# Patient Record
Sex: Female | Born: 1995 | Race: White | Hispanic: No | Marital: Single | Smoking: Current every day smoker
Health system: Southern US, Community
[De-identification: ages and names within clinical notes are randomized; demographics above are authoritative.]

## PROBLEM LIST (undated history)

## (undated) VITALS — BP 114/69 | HR 98 | Temp 98.7°F | Resp 16 | Ht 63.0 in | Wt 216.0 lb

## (undated) DIAGNOSIS — F419 Anxiety disorder, unspecified: Secondary | ICD-10-CM

## (undated) DIAGNOSIS — R519 Headache, unspecified: Secondary | ICD-10-CM

## (undated) DIAGNOSIS — F329 Major depressive disorder, single episode, unspecified: Secondary | ICD-10-CM

## (undated) DIAGNOSIS — Z639 Problem related to primary support group, unspecified: Secondary | ICD-10-CM

## (undated) DIAGNOSIS — J45909 Unspecified asthma, uncomplicated: Secondary | ICD-10-CM

## (undated) DIAGNOSIS — F3160 Bipolar disorder, current episode mixed, unspecified: Secondary | ICD-10-CM

## (undated) DIAGNOSIS — F32A Depression, unspecified: Secondary | ICD-10-CM

## (undated) DIAGNOSIS — I10 Essential (primary) hypertension: Secondary | ICD-10-CM

## (undated) DIAGNOSIS — R569 Unspecified convulsions: Secondary | ICD-10-CM

## (undated) DIAGNOSIS — F411 Generalized anxiety disorder: Secondary | ICD-10-CM

## (undated) HISTORY — DX: Unspecified asthma, uncomplicated: J45.909

## (undated) HISTORY — DX: Problem related to primary support group, unspecified: Z63.9

## (undated) HISTORY — DX: Generalized anxiety disorder: F41.1

## (undated) HISTORY — DX: Bipolar disorder, current episode mixed, unspecified: F31.60

## (undated) HISTORY — DX: Depression, unspecified: F32.A

---
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
## 2017-04-25 DIAGNOSIS — K5909 Other constipation: Secondary | ICD-10-CM | POA: Insufficient documentation

## 2018-02-01 DIAGNOSIS — Z87898 Personal history of other specified conditions: Secondary | ICD-10-CM | POA: Insufficient documentation

## 2018-02-01 DIAGNOSIS — B3731 Acute candidiasis of vulva and vagina: Secondary | ICD-10-CM | POA: Insufficient documentation

## 2019-05-24 LAB — AMB REFERRAL TO OB-GYN: Pap: NEGATIVE

## 2019-05-29 LAB — CYTOLOGY - PAP

## 2019-07-02 DIAGNOSIS — B951 Streptococcus, group B, as the cause of diseases classified elsewhere: Secondary | ICD-10-CM | POA: Insufficient documentation

## 2019-07-02 LAB — OB RESULTS CONSOLE RPR: RPR: NONREACTIVE

## 2019-07-02 LAB — OB RESULTS CONSOLE HGB/HCT, BLOOD
HCT: 40 (ref 29–41)
Hemoglobin: 12.9

## 2019-07-02 LAB — OB RESULTS CONSOLE VARICELLA ZOSTER ANTIBODY, IGG: Varicella: IMMUNE

## 2019-07-02 LAB — OB RESULTS CONSOLE RUBELLA ANTIBODY, IGM: Rubella: NON-IMMUNE/NOT IMMUNE

## 2019-07-02 LAB — OB RESULTS CONSOLE ABO/RH: RH Type: POSITIVE

## 2019-07-02 LAB — OB RESULTS CONSOLE HEPATITIS B SURFACE ANTIGEN: Hepatitis B Surface Ag: NEGATIVE

## 2019-07-02 LAB — OB RESULTS CONSOLE PLATELET COUNT: Platelets: 249

## 2019-07-02 LAB — OB RESULTS CONSOLE ANTIBODY SCREEN: Antibody Screen: NEGATIVE

## 2019-07-02 LAB — OB RESULTS CONSOLE GC/CHLAMYDIA
Chlamydia: NEGATIVE
Gonorrhea: NEGATIVE

## 2019-07-03 LAB — HEP C AB W/REFL: Hep C Virus Ab: REACTIVE

## 2019-07-06 ENCOUNTER — Encounter (HOSPITAL_COMMUNITY): Payer: Self-pay

## 2019-07-06 ENCOUNTER — Ambulatory Visit (HOSPITAL_COMMUNITY)
Admission: EM | Admit: 2019-07-06 | Discharge: 2019-07-06 | Disposition: A | Discharge status: Home / Self Care | Payer: Medicaid Other | Attending: Family Medicine | Admitting: Family Medicine

## 2019-07-06 ENCOUNTER — Inpatient Hospital Stay (HOSPITAL_COMMUNITY)
Admission: AD | Admit: 2019-07-06 | Discharge: 2019-07-06 | Disposition: A | Discharge status: Home / Self Care | Payer: Medicaid Other | Source: Ambulatory Visit | Attending: Obstetrics and Gynecology | Admitting: Obstetrics and Gynecology

## 2019-07-06 ENCOUNTER — Other Ambulatory Visit: Payer: Self-pay

## 2019-07-06 DIAGNOSIS — Z3A11 11 weeks gestation of pregnancy: Secondary | ICD-10-CM | POA: Diagnosis not present

## 2019-07-06 DIAGNOSIS — R609 Edema, unspecified: Secondary | ICD-10-CM | POA: Diagnosis not present

## 2019-07-06 DIAGNOSIS — L292 Pruritus vulvae: Secondary | ICD-10-CM | POA: Insufficient documentation

## 2019-07-06 DIAGNOSIS — Z3201 Encounter for pregnancy test, result positive: Secondary | ICD-10-CM

## 2019-07-06 DIAGNOSIS — N898 Other specified noninflammatory disorders of vagina: Secondary | ICD-10-CM | POA: Diagnosis not present

## 2019-07-06 DIAGNOSIS — N9089 Other specified noninflammatory disorders of vulva and perineum: Secondary | ICD-10-CM | POA: Insufficient documentation

## 2019-07-06 DIAGNOSIS — B373 Candidiasis of vulva and vagina: Secondary | ICD-10-CM

## 2019-07-06 DIAGNOSIS — O26891 Other specified pregnancy related conditions, first trimester: Secondary | ICD-10-CM | POA: Insufficient documentation

## 2019-07-06 DIAGNOSIS — B3731 Acute candidiasis of vulva and vagina: Secondary | ICD-10-CM

## 2019-07-06 LAB — POCT URINALYSIS DIP (DEVICE)
Bilirubin Urine: NEGATIVE
Glucose, UA: NEGATIVE mg/dL
Hgb urine dipstick: NEGATIVE
Ketones, ur: NEGATIVE mg/dL
Nitrite: NEGATIVE
Protein, ur: NEGATIVE mg/dL
Specific Gravity, Urine: 1.025 (ref 1.005–1.030)
Urobilinogen, UA: 0.2 mg/dL (ref 0.0–1.0)
pH: 7.5 (ref 5.0–8.0)

## 2019-07-06 LAB — HEPATITIS C RNA, QUANTITATIVE, PCR: HCV RNA (IU/mL): NOT DETECTED

## 2019-07-06 LAB — HIV ANTIBODY (ROUTINE TESTING W REFLEX): HIV Screen 4th Generation wRfx: NONREACTIVE

## 2019-07-06 LAB — POCT PREGNANCY, URINE: Preg Test, Ur: POSITIVE — AB

## 2019-07-06 LAB — POC URINE PREG, ED: Preg Test, Ur: POSITIVE — AB

## 2019-07-06 MED ORDER — MONISTAT 1 COMBO PACK 1200 & 2 MG & % VA KIT: 1.0000 | PACK | Freq: Once | VAGINAL | 0 refills | Status: AC

## 2019-07-06 NOTE — ED Triage Notes (Signed)
Pt presents with vaginal itching and irritation with some yellow discharge since yesterday.

## 2019-07-06 NOTE — MAU Note (Addendum)
Pt c/o of vaginal, itching, swelling since Monday. Got worse yesterday.  Says she's had these problems since she's 23 years old. Patient says she's not been able to get it checked out before. LMP 04/19/19. Does not have an OBGYN. Was seen at the ED two hours ago for same problem.

## 2019-07-06 NOTE — ED Provider Notes (Signed)
[place]    CSN: [id number] Arrival date & time: 07/06/19  9935      History   Chief Complaint Chief Complaint  Patient presents with  . Vaginitis    HPI Sharon Ward is a 23 y.o. female.   54 y old with Hx of recurrent yeast infection presented to the urgent care with a complaint of vaginal itching with irritation and yellow white discharge X 5 days. Patient stated she moved form [place] and is currently [redacted] weeks pregnant . She doesn't have an Ob GYN in [place] yet. She is sexually active and have one female partner.  The history is provided by the patient. No language interpreter was used.    History reviewed. No pertinent past medical history.  There are no active problems to display for this patient.   Past Surgical History:  Procedure Laterality Date  . PILONIDAL CYST / SINUS EXCISION      OB History    Gravida  1   Para      Term      Preterm      AB      Living        SAB      TAB      Ectopic      Multiple      Live Births               Home Medications    Prior to Admission medications   Medication Sig Start Date End Date Taking? Authorizing Provider  miconazole (MONISTAT 1 COMBO PACK) kit Place 1 each vaginally once for 1 dose. 07/06/19 07/06/19  Emerson Monte, FNP    Family History Family History  Problem Relation Age of Onset  . Healthy Mother   . Healthy Father     Social History Social History   Tobacco Use  . Smoking status: Never Smoker  . Smokeless tobacco: Never Used  Substance Use Topics  . Alcohol use: Not on file  . Drug use: Not on file     Allergies   Citrus, Orange (diagnostic), Peach flavor, Pear, and Pineapple   Review of Systems Review of Systems  Constitutional: Negative for activity change, appetite change, chills, fatigue and fever.  Respiratory: Negative for cough, chest tightness and shortness of breath.   Cardiovascular: Negative for chest pain and leg swelling.   Genitourinary: Positive for vaginal discharge. Negative for difficulty urinating and dysuria.       Itching and irritation     Physical Exam Triage Vital Signs ED Triage Vitals  Enc Vitals Group     BP 07/06/19 1014 117/72     Pulse Rate 07/06/19 1014 70     Resp 07/06/19 1014 20     Temp 07/06/19 1014 98 F (36.7 C)     Temp Source 07/06/19 1014 Oral     SpO2 07/06/19 1014 99 %     Weight --      Height --      Head Circumference --      Peak Flow --      Pain Score 07/06/19 1016 5     Pain Loc --      Pain Edu? --      Excl. in [place]? --    No data found.  Updated Vital Signs BP 117/72 (BP Location: Right Arm)   Pulse 70   Temp 98 F (36.7 C) (Oral)   Resp 20   LMP 04/19/2019   SpO2  99%   Visual Acuity Right Eye Distance:   Left Eye Distance:   Bilateral Distance:    Right Eye Near:   Left Eye Near:    Bilateral Near:     Physical Exam Constitutional:      General: She is not in acute distress.    Appearance: Normal appearance. She is normal weight. She is not ill-appearing.  Cardiovascular:     Rate and Rhythm: Normal rate and regular rhythm.     Pulses: Normal pulses.     Heart sounds: Normal heart sounds.  Pulmonary:     Effort: Pulmonary effort is normal. No respiratory distress.     Breath sounds: Normal breath sounds. No wheezing.  Chest:     Chest wall: No tenderness.  Genitourinary:    General: Normal vulva.     Vagina: Vaginal discharge present.     Comments: Vaginal itching Neurological:     Mental Status: She is alert.      UC Treatments / Results  Labs (all labs ordered are listed, but only abnormal results are displayed) Labs Reviewed  POC URINE PREG, ED - Abnormal; Notable for the following components:      Result Value   Preg Test, Ur POSITIVE (*)    All other components within normal limits  POCT URINALYSIS DIP (DEVICE) - Abnormal; Notable for the following components:   Leukocytes,Ua SMALL (*)    All other components  within normal limits  POCT PREGNANCY, URINE - Abnormal; Notable for the following components:   Preg Test, Ur POSITIVE (*)    All other components within normal limits  HIV ANTIBODY (ROUTINE TESTING W REFLEX)  CERVICOVAGINAL ANCILLARY ONLY    EKG   Radiology No results found.  Procedures Procedures (including critical care time)  Medications Ordered in UC Medications - No data to display  Initial Impression / Assessment and Plan / UC Course  I have reviewed the triage vital signs and the nursing notes.  Pertinent labs & imaging results that were available during my care of the patient were reviewed by me and considered in my medical decision making (see chart for details).    Patient was advised to see OB-GYN. Monistat was prescribed. We will call her when her STD result becomes available Final Clinical Impressions(s) / UC Diagnoses   Final diagnoses:  Yeast vaginitis     Discharge Instructions     Our urine test confirm you are pregnant You are being treated for possible yeast vaginitis (BV) today.  Please take and complete all medications as prescribed.  We will notify you when we receive the results of your sexually transmitted infection (STI) screening tests. If needed, we will prescribe any medications needed.  If you are not improving over the next few days or feel you are worsening please follow up with OBGYN ASAP or  go to the Emergency Department if you are unable to see your regular doctor.    ED Prescriptions    Medication Sig Dispense Auth. Provider   miconazole (MONISTAT 1 COMBO PACK) kit Place 1 each vaginally once for 1 dose. 1 each Brendia Dampier, Darrelyn Hillock, FNP     PDMP not reviewed this encounter.   Emerson Monte, FNP 07/06/19 1110

## 2019-07-06 NOTE — MAU Provider Note (Signed)
First Provider Initiated Contact with Patient 07/06/19 1254      S [name] is a 23 y.o. G1P0 at [redacted]w[redacted]d who was seen at urgent care about 2 hours ago. She states that they told her she needed to be seen here now. She has vulvar irritation that she reports she has had since the age of 68. SHe states that from [age] it was "pretty much all the time". Then she had a few years between [age] when it wasn't as bad. However last year starting at age 14 it was the worst it has ever been. She denies any OB complaints today and has just been seen for STD testing at urgent care today. She has used the monistat one day treatment just prior to arrival, and states that it has not improved her symptoms yet.   O BP 100/62 (BP Location: Right Arm)   Pulse 83   Temp 99.1 F (37.3 C) (Oral)   Resp 18   Ht 5\' 3"  (1.6 m)   Wt 68.4 kg   LMP 04/19/2019   SpO2 100% Comment: room air  BMI 26.71 kg/m  Physical Exam  Nursing note and vitals reviewed. Constitutional: She is oriented to person, place, and time. She appears well-developed and well-nourished. No distress.  HENT:  Head: Normocephalic.  Cardiovascular: Normal rate.  Respiratory: Effort normal.  GI: Soft. There is no abdominal tenderness. There is no rebound.  Neurological: She is alert and oriented to person, place, and time.  Skin: Skin is warm and dry.  Psychiatric: She has a normal mood and affect.    A Medical screening exam complete 1. Irritation of vulva   2. [redacted] weeks gestation of pregnancy      P DC home Patient advised to make an appt in the office, phone number and address given Comfort measures reviewed  1st Trimester precautions  RX: no new RX  Return to MAU as needed FU with OB as planned  Sharon Ward for Sharon Ward. Schedule an appointment as soon as possible for a visit.   Specialty: Obstetrics and Gynecology Contact information: Sharon Ward [address], [address] [id number] [place] [id number] Hendricks DNP, CNM  07/06/19  1:10 PM

## 2019-07-06 NOTE — Discharge Instructions (Signed)
Our urine test confirm you are pregnant You are being treated for possible yeast vaginitis (BV) today.  Please take and complete all medications as prescribed.  We will notify you when we receive the results of your sexually transmitted infection (STI) screening tests. If needed, we will prescribe any medications needed.  If you are not improving over the next few days or feel you are worsening please follow up with OBGYN ASAP or  go to the Emergency Department if you are unable to see your regular doctor.

## 2019-07-10 LAB — CERVICOVAGINAL ANCILLARY ONLY
Bacterial vaginitis: NEGATIVE
Candida vaginitis: POSITIVE — AB
Chlamydia: NEGATIVE
Neisseria Gonorrhea: NEGATIVE
Trichomonas: NEGATIVE

## 2019-08-10 NOTE — L&D Delivery Note (Signed)
OB/GYN Faculty Practice Delivery Note  Sharon Ward is a 24 y.o. G1P0 s/p VD at [redacted]w[redacted]d. She was admitted for SOL/SROM.   ROM: 7h 42m with clear fluid GBS Status: Positive   Maximum Maternal Temperature: 98.69F  Labor Progress: . Initial SVE: 4/70/-3. She then progressed to complete.   Delivery Date/Time: 6/7 @ [phone] Delivery: Called to room and patient was complete and pushing. Head delivered in ROA position. No nuchal cord present. Shoulder and body delivered in usual fashion. Infant with spontaneous cry, placed on mother's abdomen, dried and stimulated. Cord clamped x 2 after 1-minute delay, and cut by FOB. Cord blood drawn. Placenta delivered spontaneously with gentle cord traction. Fundus firm with massage and Pitocin. Labia, perineum, vagina, and cervix inspected inspected with no lacerations.  Baby Weight: pending  Placenta: Sent to L&D Complications: None Lacerations: None EBL: 101 mL Analgesia: None   Infant:  APGAR (1 MIN): 8   APGAR (5 MINS): 9   APGAR (10 MINS):     Jerilynn Birkenhead, MD [facility] Family Medicine Fellow, [facility], [facility] 01/14/2020, 4:47 AM

## 2019-09-18 DIAGNOSIS — F319 Bipolar disorder, unspecified: Secondary | ICD-10-CM | POA: Insufficient documentation

## 2019-09-18 DIAGNOSIS — Z9229 Personal history of other drug therapy: Secondary | ICD-10-CM

## 2019-09-18 DIAGNOSIS — J302 Other seasonal allergic rhinitis: Secondary | ICD-10-CM | POA: Insufficient documentation

## 2019-09-18 DIAGNOSIS — R87612 Low grade squamous intraepithelial lesion on cytologic smear of cervix (LGSIL): Secondary | ICD-10-CM | POA: Insufficient documentation

## 2019-09-18 DIAGNOSIS — F419 Anxiety disorder, unspecified: Secondary | ICD-10-CM | POA: Insufficient documentation

## 2019-09-18 DIAGNOSIS — F32A Depression, unspecified: Secondary | ICD-10-CM | POA: Insufficient documentation

## 2019-09-18 DIAGNOSIS — F411 Generalized anxiety disorder: Secondary | ICD-10-CM

## 2019-09-18 DIAGNOSIS — F332 Major depressive disorder, recurrent severe without psychotic features: Secondary | ICD-10-CM

## 2019-09-18 DIAGNOSIS — F329 Major depressive disorder, single episode, unspecified: Secondary | ICD-10-CM | POA: Insufficient documentation

## 2019-09-18 DIAGNOSIS — J45909 Unspecified asthma, uncomplicated: Secondary | ICD-10-CM | POA: Insufficient documentation

## 2019-09-18 DIAGNOSIS — Z8659 Personal history of other mental and behavioral disorders: Secondary | ICD-10-CM | POA: Insufficient documentation

## 2019-09-18 DIAGNOSIS — R45851 Suicidal ideations: Secondary | ICD-10-CM | POA: Insufficient documentation

## 2019-09-18 DIAGNOSIS — R112 Nausea with vomiting, unspecified: Secondary | ICD-10-CM

## 2019-09-18 DIAGNOSIS — F913 Oppositional defiant disorder: Secondary | ICD-10-CM | POA: Insufficient documentation

## 2019-09-18 HISTORY — DX: Major depressive disorder, recurrent severe without psychotic features: F33.2

## 2019-09-18 HISTORY — DX: Generalized anxiety disorder: F41.1

## 2019-09-24 DIAGNOSIS — B951 Streptococcus, group B, as the cause of diseases classified elsewhere: Secondary | ICD-10-CM

## 2019-09-24 DIAGNOSIS — O0990 Supervision of high risk pregnancy, unspecified, unspecified trimester: Secondary | ICD-10-CM

## 2019-09-28 ENCOUNTER — Ambulatory Visit (INDEPENDENT_AMBULATORY_CARE_PROVIDER_SITE_OTHER): Payer: Medicaid Other | Admitting: Family Medicine

## 2019-09-28 ENCOUNTER — Encounter: Payer: Self-pay | Admitting: Family Medicine

## 2019-09-28 ENCOUNTER — Other Ambulatory Visit: Payer: Self-pay

## 2019-09-28 VITALS — BP 112/53 | HR 81 | Wt 183.7 lb

## 2019-09-28 DIAGNOSIS — O99210 Obesity complicating pregnancy, unspecified trimester: Secondary | ICD-10-CM | POA: Insufficient documentation

## 2019-09-28 DIAGNOSIS — Z3A23 23 weeks gestation of pregnancy: Secondary | ICD-10-CM

## 2019-09-28 DIAGNOSIS — O0992 Supervision of high risk pregnancy, unspecified, second trimester: Secondary | ICD-10-CM

## 2019-09-28 DIAGNOSIS — O0990 Supervision of high risk pregnancy, unspecified, unspecified trimester: Secondary | ICD-10-CM

## 2019-09-28 DIAGNOSIS — Z3403 Encounter for supervision of normal first pregnancy, third trimester: Secondary | ICD-10-CM | POA: Insufficient documentation

## 2019-09-28 DIAGNOSIS — O4692 Antepartum hemorrhage, unspecified, second trimester: Secondary | ICD-10-CM

## 2019-09-28 DIAGNOSIS — O0932 Supervision of pregnancy with insufficient antenatal care, second trimester: Secondary | ICD-10-CM | POA: Insufficient documentation

## 2019-09-28 DIAGNOSIS — F32A Depression, unspecified: Secondary | ICD-10-CM

## 2019-09-28 DIAGNOSIS — O99212 Obesity complicating pregnancy, second trimester: Secondary | ICD-10-CM

## 2019-09-28 DIAGNOSIS — F329 Major depressive disorder, single episode, unspecified: Secondary | ICD-10-CM

## 2019-09-28 DIAGNOSIS — O4690 Antepartum hemorrhage, unspecified, unspecified trimester: Secondary | ICD-10-CM | POA: Insufficient documentation

## 2019-09-28 MED ORDER — BLOOD PRESSURE KIT DEVI: 1.0000 | 0 refills | Status: DC | PRN

## 2019-09-28 NOTE — Progress Notes (Addendum)
PRENATAL VISIT NOTE  Subjective:  Sharon Ward is a 24 y.o. G1P0 at 61w1dbeing seen today for ongoing prenatal care.  She is currently monitored for the following issues for this high-risk pregnancy and has Anxiety; Asthma; [place]; Depression; LGSIL on Pap smear of cervix; Oppositional defiant disorder; H/O psychiatric hospitalization; Seasonal allergies; Supervision of high risk pregnancy, antepartum; and Vaginal bleeding during pregnancy on their problem list.  Patient reports Had vaginal itching 1 week ago, used monostat and this alleviated her symtpoms.  Just moved to [place] in Jan.  [facility] She reports she had [place] Dec 2020- reports she was told she needs another appointment between 18-20 wks. [place] 11/23 showed possible increaesd NT and then report UKoreain dec that this was improved. [facility] [place] [facility].  Contractions: Not present.  .  Movement: Present. Denies leaking of fluid.   The following portions of the patient's history were reviewed and updated as appropriate: allergies, current medications, past family history, past medical history, past social history, past surgical history and problem list.   Objective:   Vitals:   09/28/19 0958  BP: (!) 112/53  Pulse: 81  Weight: 183 lb 11.2 oz (83.3 kg)    Fetal Status: Fetal Heart Rate (bpm): 152 Fundal Height: 24 cm Movement: Present     General:  Alert, oriented and cooperative. Patient is in no acute distress.  Skin: Skin is warm and dry. No rash noted.   Cardiovascular: Normal heart rate noted  Respiratory: Normal respiratory effort, no problems with respiration noted  Abdomen: Soft, gravid, appropriate for gestational age.  Pain/Pressure: Absent     Pelvic: Cervical exam deferred        Extremities: Normal range of motion.  Edema: Trace  Mental Status: Normal mood and affect. Normal behavior. Normal judgment and thought content.   Assessment and  Plan:  Pregnancy: G1P0 at 216w1d1. Supervision of high risk pregnancy, antepartum [facility]/FMOB/OBGYN/residents etc - Patient will sign ROI so we can [facility] - Reviewed next visit will have 28 wk labs  - reports history of recurrent yeast infections, no sx currently - [facility] signed. Patient may need follow up.  - Blood Pressure Monitoring (BLOOD PRESSURE KIT) DEVI; 1 Device by Does not apply route as needed.  Dispense: 1 each; Refill: 0 - [facility] AMB BABYSCRIPTS SCHEDULE OPTIMIZATION - [place]FM OB COMP + 14 WK; Future - Amb ref to [facility]  2. Vaginal bleeding during pregnancy Reports only ever sex last month Counseled to see care if happens again and about normality of this when pregnant  3. Depression, unspecified depression type PHQ9= 11 and GAD= 6 today. Confirmed taking Zoloft and Seroquel as only pysch medications Patient with significant pysch history and is aware of postpartum risk of worsening Strongly desires counseling and [facility]. Obesity - has gained excessive weight (30 lbs) - Reviewed importance of diet and exercise - Discussed 2hr gtt at next visit  Preterm labor symptoms and general obstetric precautions including but not limited to vaginal bleeding, contractions, leaking of fluid and fetal movement were reviewed in detail with the patient. Please refer to After Visit Summary for other counseling recommendations.   Return in about 4 weeks (around 10/26/2019)  for Routine prenatal care, in person, 28 wk labs.  Future Appointments  Date Time Provider [place]  10/02/2019  7:45 AM WH-MFC [place] 2 WH-MFCUS MFC-US  10/08/2019 10:15 AM George [place]  10/25/2019  8:20 AM WOC-WOCA LAB WOC-WOCA WOC  10/25/2019  8:55 AM Julianne Handler, CNM WOC-WOCA WOC     Caren Macadam, MD

## 2019-09-28 NOTE — Patient Instructions (Signed)
Healthy Eating Following a healthy eating pattern may help you to achieve and maintain a healthy body weight, reduce the risk of chronic disease, and live a long and productive life. It is important to follow a healthy eating pattern at an appropriate calorie level for your body. Your nutritional needs should be met primarily through food by choosing a variety of nutrient-rich foods. What are tips for following this plan? Reading food labels  Read labels and choose the following: ? Reduced or low sodium. ? Juices with 100% fruit juice. ? Foods with low saturated fats and high polyunsaturated and monounsaturated fats. ? Foods with whole grains, such as whole wheat, cracked wheat, brown rice, and wild rice. ? Whole grains that are fortified with folic acid. This is recommended for women who are pregnant or who want to become pregnant.  Read labels and avoid the following: ? Foods with a lot of added sugars. These include foods that contain brown sugar, corn sweetener, corn syrup, dextrose, fructose, glucose, high-fructose corn syrup, honey, invert sugar, lactose, malt syrup, maltose, molasses, raw sugar, sucrose, trehalose, or turbinado sugar.  Do not eat more than the following amounts of added sugar per day:  6 teaspoons (25 g) for women.  9 teaspoons (38 g) for men. ? Foods that contain processed or refined starches and grains. ? Refined grain products, such as white flour, degermed cornmeal, white bread, and white rice. Shopping  Choose nutrient-rich snacks, such as vegetables, whole fruits, and nuts. Avoid high-calorie and high-sugar snacks, such as potato chips, fruit snacks, and candy.  Use oil-based dressings and spreads on foods instead of solid fats such as butter, stick margarine, or cream cheese.  Limit pre-made sauces, mixes, and "instant" products such as flavored rice, instant noodles, and ready-made pasta.  Try more plant-protein sources, such as tofu, tempeh, black beans,  edamame, lentils, nuts, and seeds.  Explore eating plans such as the Mediterranean diet or vegetarian diet. Cooking  Use oil to saut or stir-fry foods instead of solid fats such as butter, stick margarine, or lard.  Try baking, boiling, grilling, or broiling instead of frying.  Remove the fatty part of meats before cooking.  Steam vegetables in water or broth. Meal planning   At meals, imagine dividing your plate into fourths: ? One-half of your plate is fruits and vegetables. ? One-fourth of your plate is whole grains. ? One-fourth of your plate is protein, especially lean meats, poultry, eggs, tofu, beans, or nuts.  Include low-fat dairy as part of your daily diet. Lifestyle  Choose healthy options in all settings, including home, work, school, restaurants, or stores.  Prepare your food safely: ? Wash your hands after handling raw meats. ? Keep food preparation surfaces clean by regularly washing with hot, soapy water. ? Keep raw meats separate from ready-to-eat foods, such as fruits and vegetables. ? Cook seafood, meat, poultry, and eggs to the recommended internal temperature. ? Store foods at safe temperatures. In general:  Keep cold foods at 59F (4.4C) or below.  Keep hot foods at 159F (60C) or above.  Keep your freezer at [facility] (-17.8C) or below.  Foods are no longer safe to eat when they have been between the temperatures of 40-159F (4.4-60C) for more than 2 hours. What foods should I eat? Fruits Aim to eat 2 cup-equivalents of fresh, canned (in natural juice), or frozen fruits each day. Examples of 1 cup-equivalent of fruit include 1 small apple, 8 large strawberries, 1 cup canned fruit,  cup  dried fruit, or 1 cup 100% juice. Vegetables Aim to eat 2-3 cup-equivalents of fresh and frozen vegetables each day, including different varieties and colors. Examples of 1 cup-equivalent of vegetables include 2 medium carrots, 2 cups raw, leafy greens, 1 cup chopped  vegetable (raw or cooked), or 1 medium baked potato. Grains Aim to eat 6 ounce-equivalents of whole grains each day. Examples of 1 ounce-equivalent of grains include 1 slice of bread, 1 cup ready-to-eat cereal, 3 cups popcorn, or  cup cooked rice, pasta, or cereal. Meats and other proteins Aim to eat 5-6 ounce-equivalents of protein each day. Examples of 1 ounce-equivalent of protein include 1 egg, 1/2 cup nuts or seeds, or 1 tablespoon (16 g) peanut butter. A cut of meat or fish that is the size of a deck of cards is about 3-4 ounce-equivalents.  Of the protein you eat each week, try to have at least 8 ounces come from seafood. This includes salmon, trout, herring, and anchovies. Dairy Aim to eat 3 cup-equivalents of fat-free or low-fat dairy each day. Examples of 1 cup-equivalent of dairy include 1 cup (240 mL) milk, 8 ounces (250 g) yogurt, 1 ounces (44 g) natural cheese, or 1 cup (240 mL) fortified soy milk. Fats and oils  Aim for about 5 teaspoons (21 g) per day. Choose monounsaturated fats, such as canola and olive oils, avocados, peanut butter, and most nuts, or polyunsaturated fats, such as sunflower, corn, and soybean oils, walnuts, pine nuts, sesame seeds, sunflower seeds, and flaxseed. Beverages  Aim for six 8-oz glasses of water per day. Limit coffee to three to five 8-oz cups per day.  Limit caffeinated beverages that have added calories, such as soda and energy drinks.  Limit alcohol intake to no more than 1 drink a day for nonpregnant women and 2 drinks a day for men. One drink equals 12 oz of beer (355 mL), 5 oz of wine (148 mL), or 1 oz of hard liquor (44 mL). Seasoning and other foods  Avoid adding excess amounts of salt to your foods. Try flavoring foods with herbs and spices instead of salt.  Avoid adding sugar to foods.  Try using oil-based dressings, sauces, and spreads instead of solid fats. This information is based on general [place] nutrition guidelines. For more  information, visit [employer].es. Exact amounts may vary based on your nutrition needs. Summary  A healthy eating plan may help you to maintain a healthy weight, reduce the risk of chronic diseases, and stay active throughout your life.  Plan your meals. Make sure you eat the right portions of a variety of nutrient-rich foods.  Try baking, boiling, grilling, or broiling instead of frying.  Choose healthy options in all settings, including home, work, school, restaurants, or stores. This information is not intended to replace advice given to you by your health care provider. Make sure you discuss any questions you have with your health care provider. Document Revised: 11/07/2017 Document Reviewed: 11/07/2017 Elsevier Patient Education  [place].

## 2019-10-02 ENCOUNTER — Other Ambulatory Visit: Payer: Self-pay

## 2019-10-02 ENCOUNTER — Ambulatory Visit (HOSPITAL_COMMUNITY)
Admission: RE | Admit: 2019-10-02 | Discharge: 2019-10-02 | Disposition: A | Discharge status: Home / Self Care | Payer: Medicaid Other | Source: Ambulatory Visit | Attending: Obstetrics and Gynecology | Admitting: Obstetrics and Gynecology

## 2019-10-02 ENCOUNTER — Other Ambulatory Visit: Payer: Self-pay | Admitting: Family Medicine

## 2019-10-02 DIAGNOSIS — O99212 Obesity complicating pregnancy, second trimester: Secondary | ICD-10-CM

## 2019-10-02 DIAGNOSIS — O358XX0 Maternal care for other (suspected) fetal abnormality and damage, not applicable or unspecified: Secondary | ICD-10-CM | POA: Diagnosis not present

## 2019-10-02 DIAGNOSIS — O0990 Supervision of high risk pregnancy, unspecified, unspecified trimester: Secondary | ICD-10-CM | POA: Diagnosis not present

## 2019-10-02 DIAGNOSIS — Z3A23 23 weeks gestation of pregnancy: Secondary | ICD-10-CM | POA: Diagnosis not present

## 2019-10-03 ENCOUNTER — Other Ambulatory Visit (HOSPITAL_COMMUNITY): Payer: Self-pay | Admitting: Obstetrics and Gynecology

## 2019-10-03 DIAGNOSIS — Z363 Encounter for antenatal screening for malformations: Secondary | ICD-10-CM

## 2019-10-08 ENCOUNTER — Other Ambulatory Visit: Payer: Self-pay

## 2019-10-08 ENCOUNTER — Telehealth: Payer: Self-pay | Admitting: Family Medicine

## 2019-10-08 ENCOUNTER — Ambulatory Visit (INDEPENDENT_AMBULATORY_CARE_PROVIDER_SITE_OTHER): Payer: No Typology Code available for payment source | Admitting: Clinical

## 2019-10-08 DIAGNOSIS — F39 Unspecified mood [affective] disorder: Secondary | ICD-10-CM

## 2019-10-08 NOTE — BH Specialist Note (Signed)
Integrated Behavioral Health via Telemedicine Video Visit 1 10/08/2019 Sharon Ward [id number]  Number of Integrated Behavioral Health visits: 1 Session Start time: 10:20  Session End time: 11:21 Total time: 41  Referring Provider: Jaynie Collins, MD Type of Visit: Video Patient/Family location: Home [facility] Provider location: [facility] All persons participating in visit: Patient Sharon Ward and [facility] Sharon Ward    Confirmed patient's address: Yes  Confirmed patient's phone number: Yes  Any changes to demographics: No   Confirmed patient's insurance: Yes  Any changes to patient's insurance: No   Discussed confidentiality: Yes   I connected with Sharon Ward  by a video enabled telemedicine application and verified that I am speaking with the correct person using two identifiers.     I discussed the limitations of evaluation and management by telemedicine and the availability of in person appointments.  I discussed that the purpose of this visit is to provide behavioral health care while limiting exposure to the novel coronavirus.   Discussed there is a possibility of technology failure and discussed alternative modes of communication if that failure occurs.  I discussed that engaging in this video visit, they consent to the provision of behavioral healthcare and the services will be billed under their insurance.  Patient and/or legal guardian expressed understanding and consented to video visit: Yes   PRESENTING CONCERNS: Patient and/or family reports the following symptoms/concerns: Pt states she is seeing a psychiatrist, Dr. Ashley Ward in [place], [place], and would like a referral to psychiatry in [place] area for continued [facility] medication management. Pt's primary concern today is stress and worry about being a first-time mother, along with lack of full family support and body image issues.  Duration of problem: Ongoing; Severity of problem: moderate  STRENGTHS  (Protective Factors/Coping Skills): Supportive father and FOB  GOALS ADDRESSED: Patient will: 1.  Reduce symptoms of: anxiety, depression and stress  2.  Increase knowledge and/or ability of: healthy habits and stress reduction  3.  Demonstrate ability to: Increase healthy adjustment to current life circumstances and Increase adequate support systems for patient/family  INTERVENTIONS: Interventions utilized:  Medication Monitoring and Psychoeducation and/or Health Education Standardized Assessments completed: PHQ9/GAD7 given in past two weeks  ASSESSMENT: Patient currently experiencing Mood disorder, unspecified  Patient may benefit from psychoeducation and brief therapeutic interventions regarding coping with symptoms of anxiety, depression, and life stress .  PLAN: 1. Follow up with behavioral health clinician on : Two weeks 2. Behavioral recommendations:  -Continue taking BH medications as prescribed (Zoloft; Seroquel) -Continue taking prenatal vitamin daily -Accept referral to psychiatry -Continue attending psychiatry appointment with Dr Sharon Ward in [facility] until established with psychiatry in [place]  -Go to [email or web address] to view Virtual Tour of the [facility], and to register for childbirth education classes for self and boyfriend 3. Referral(s): Integrated Art gallery manager (In Clinic) and [employer]:  childbirth education  I discussed the assessment and treatment plan with the patient and/or parent/guardian. They were provided an opportunity to ask questions and all were answered. They agreed with the plan and demonstrated an understanding of the instructions.   They were advised to call back or seek an in-person evaluation if the symptoms worsen or if the condition fails to improve as anticipated.  Sharon Ward  Depression screen [facility] 2/9 09/28/2019  Decreased Interest 1  Down, Depressed, Hopeless 1  PHQ - 2 Score 2   Altered sleeping 2  Tired, decreased energy 2  Change in appetite 2  Feeling bad or  failure about yourself  3  Trouble concentrating 0  Moving slowly or fidgety/restless 0  Suicidal thoughts 0  PHQ-9 Score 11   GAD 7 : Generalized Anxiety Score 09/28/2019  Nervous, Anxious, on Edge 1  Control/stop worrying 2  Worry too much - different things 2  Trouble relaxing 0  Restless 0  Easily annoyed or irritable 1  Afraid - awful might happen 0  Total GAD 7 Score 6

## 2019-10-08 NOTE — Telephone Encounter (Signed)
Received a call from the patient wanting to know what type of lotion to use because she is itchy.

## 2019-10-08 NOTE — Patient Instructions (Signed)
/  Emotional Wellbeing Apps and Websites Here are a few free apps meant to help you to help yourself.  To find, try searching on the internet to see if the app is offered on Apple/Android devices. If your first choice doesn't come up on your device, the good news is that there are many choices! Play around with different apps to see which ones are helpful to you.    Calm This is an app meant to help increase calm feelings. Includes info, strategies, and tools for tracking your feelings.      Calm Harm  This app is meant to help with self-harm. Provides many 5-minute or 15-min coping strategies for doing instead of hurting yourself.       Healthy Minds Health Minds is a problem-solving tool to help deal with emotions and cope with stress you encounter wherever you are.      MindShift This app can help people cope with anxiety. Rather than trying to avoid anxiety, you can make an important shift and face it.      MY3  MY3 features a support system, safety plan and resources with the goal of offering a tool to use in a time of need.       My Life My Voice  This mood journal offers a simple solution for tracking your thoughts, feelings and moods. Animated emoticons can help identify your mood.       Relax Melodies Designed to help with sleep, on this app you can mix sounds and meditations for relaxation.      Smiling Mind Smiling Mind is meditation made easy: it's a simple tool that helps put a smile on your mind.        Stop, Breathe & Think  A friendly, simple guide for people through meditations for mindfulness and compassion.  Stop, Breathe and Think Kids Enter your current feelings and choose a "mission" to help you cope. Offers videos for certain moods instead of just sound recordings.       Team Orange The goal of this tool is to help teens change how they think, act, and react. This app helps you focus on your own good feelings and experiences.      The  Virtual Hope Box The Virtual Hope Box (VHB) contains simple tools to help patients with coping, relaxation, distraction, and positive thinking.     

## 2019-10-09 NOTE — Telephone Encounter (Signed)
Called pt and informed pt that she can use any otc lotion that is good for dry skin like Aveeno or Eucurin.  I informed pt that the lotions otc do not harm the baby.  Pt stated that she has a family hx of psoriasis and she wants to know if she put on her hands what was used in the past.  I recommended that the pt has a provider look at her hands to determine what is needed in the meantime use the Aveeno lotion or other otc lotion for dry skin.  Pt verbalized understanding.   Sharon Naegeli, RN

## 2019-10-17 ENCOUNTER — Telehealth: Payer: Self-pay | Admitting: *Deleted

## 2019-10-17 NOTE — Telephone Encounter (Signed)
VM message left by pt stating that she has a hemorrhoid and wants to know if it's ok to use the ointment that she purchased. I called pt and she confirmed the ointment is generic for Preparation H. I advised pt that she may use it according to package directions. Pt denies having constipation. She was advised to continue to drink plenty of fluids daily, eat fruits and vegetables and to begin taking Colace if she does become constipated. Pt voiced understanding of all information and instructions given.

## 2019-10-19 ENCOUNTER — Other Ambulatory Visit: Payer: Self-pay

## 2019-10-19 DIAGNOSIS — O0990 Supervision of high risk pregnancy, unspecified, unspecified trimester: Secondary | ICD-10-CM

## 2019-10-22 ENCOUNTER — Ambulatory Visit (INDEPENDENT_AMBULATORY_CARE_PROVIDER_SITE_OTHER): Payer: Medicaid Other | Admitting: Licensed Clinical Social Worker

## 2019-10-22 DIAGNOSIS — F39 Unspecified mood [affective] disorder: Secondary | ICD-10-CM

## 2019-10-22 NOTE — BH Specialist Note (Signed)
Integrated Behavioral Health Follow Up Visit  MRN: [id number] Name: Sharon Ward  Number of Integrated Behavioral Health Clinician visits: 1 Session Start time: 10:05am  Session End time: 10:19 Total time: 14 mins   Type of Service: Integrated Behavioral Health- Individual Interpretor:no  Interpretor Name and Language: none   SUBJECTIVE: Sharon Ward is a 24 y.o. female accompanied by n/a Patient was referred by Dr. Macon Large  for depression . Patient reports the following symptoms/concerns: Stress and Depression  Duration of problem: start of pregnancy ; Severity of problem: mild   OBJECTIVE: Mood: good  and Affect: congruent Risk of harm to self or others: no risk of harm to self or others   LIFE CONTEXT: Family and Social: School/Work:  Self-Care:  Life Changes: relocated to [employer] [place]   GOALS ADDRESSED: Patient will: 1.  Reduce symptoms of: mood disorder   2.  Increase knowledge and/or ability of: strategies for self managing symptoms   3.  Demonstrate ability to: identify triggers and self cope   INTERVENTIONS: Interventions utilized:  Brief supportive counseling  Standardized Assessments completed: last screen completed on 09/28/2019  ASSESSMENT: Patient currently experiencing mood disorder   Patient may benefit from brief therapeutic interventions, self managing symptoms and reducing stress .  PLAN: 1. Follow up with behavioral health clinician on : two weeks  2. Behavioral recommendations: continue taking medication as prescribed, prioritize sleep and keep all scheduled appt with psychiatry  3. Referral(s):  4. "From scale of 1-10, how likely are you to follow plan?":   Gwyndolyn Saxon, LCSW

## 2019-10-23 ENCOUNTER — Ambulatory Visit (HOSPITAL_COMMUNITY): Payer: No Typology Code available for payment source | Admitting: Psychiatry

## 2019-10-24 ENCOUNTER — Telehealth: Payer: Self-pay | Admitting: Clinical

## 2019-10-24 ENCOUNTER — Encounter (HOSPITAL_COMMUNITY): Payer: Self-pay | Admitting: Psychiatry

## 2019-10-24 ENCOUNTER — Ambulatory Visit (INDEPENDENT_AMBULATORY_CARE_PROVIDER_SITE_OTHER): Payer: No Typology Code available for payment source | Admitting: Psychiatry

## 2019-10-24 VITALS — Ht 63.0 in | Wt 180.0 lb

## 2019-10-24 DIAGNOSIS — F3160 Bipolar disorder, current episode mixed, unspecified: Secondary | ICD-10-CM

## 2019-10-24 DIAGNOSIS — F411 Generalized anxiety disorder: Secondary | ICD-10-CM | POA: Diagnosis not present

## 2019-10-24 DIAGNOSIS — F331 Major depressive disorder, recurrent, moderate: Secondary | ICD-10-CM

## 2019-10-24 DIAGNOSIS — Z8659 Personal history of other mental and behavioral disorders: Secondary | ICD-10-CM

## 2019-10-24 DIAGNOSIS — O0990 Supervision of high risk pregnancy, unspecified, unspecified trimester: Secondary | ICD-10-CM

## 2019-10-24 NOTE — Telephone Encounter (Signed)
Left HIPPA-compliant message to call back Jamie from [facility] at [phone].   
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
## 2019-10-24 NOTE — Telephone Encounter (Signed)
Pt is having a hard time getting in touch with [facility]. I recommended that I could reach out to Sue Lush, whom the Pt seen on the 15th, due to me being restricted from seeing if Shelly Coss has tried to reach out to her. Pt stated that Sue Lush is the one who referred her to leave a voicemail with Asher Muir. Pt agreed to waiting until Asher Muir returns to hear something. Pt had no other questions at this time.

## 2019-10-25 ENCOUNTER — Other Ambulatory Visit: Payer: Medicaid Other

## 2019-10-25 ENCOUNTER — Ambulatory Visit (INDEPENDENT_AMBULATORY_CARE_PROVIDER_SITE_OTHER): Payer: Medicaid Other | Admitting: Certified Nurse Midwife

## 2019-10-25 ENCOUNTER — Other Ambulatory Visit: Payer: Self-pay

## 2019-10-25 VITALS — BP 119/67 | HR 73 | Wt 192.6 lb

## 2019-10-25 DIAGNOSIS — O99342 Other mental disorders complicating pregnancy, second trimester: Secondary | ICD-10-CM

## 2019-10-25 DIAGNOSIS — O0990 Supervision of high risk pregnancy, unspecified, unspecified trimester: Secondary | ICD-10-CM

## 2019-10-25 DIAGNOSIS — O2602 Excessive weight gain in pregnancy, second trimester: Secondary | ICD-10-CM | POA: Diagnosis not present

## 2019-10-25 DIAGNOSIS — F319 Bipolar disorder, unspecified: Secondary | ICD-10-CM

## 2019-10-25 DIAGNOSIS — Z23 Encounter for immunization: Secondary | ICD-10-CM | POA: Diagnosis not present

## 2019-10-25 DIAGNOSIS — Z3A27 27 weeks gestation of pregnancy: Secondary | ICD-10-CM | POA: Diagnosis not present

## 2019-10-25 DIAGNOSIS — O2600 Excessive weight gain in pregnancy, unspecified trimester: Secondary | ICD-10-CM | POA: Insufficient documentation

## 2019-10-25 NOTE — Progress Notes (Signed)
Subjective:  Sharon Ward is a 24 y.o. G1P0 at [redacted]w[redacted]d being seen today for ongoing prenatal care.  She is currently monitored for the following issues for this high-risk pregnancy and has Anxiety; Asthma; Bipolar disorder (HCC); Depression; LGSIL on Pap smear of cervix; Oppositional defiant disorder; H/O psychiatric hospitalization; Seasonal allergies; Supervision of high risk pregnancy, antepartum; Vaginal bleeding during pregnancy; Obesity affecting pregnancy; Late prenatal care affecting pregnancy in second trimester; Group beta Strep positive; and Excess weight gain in pregnancy on their problem list.  Patient reports no complaints.  Contractions: Irritability. Vag. Bleeding: None.  Movement: Present. Denies leaking of fluid.   The following portions of the patient's history were reviewed and updated as appropriate: allergies, current medications, past family history, past medical history, past social history, past surgical history and problem list. Problem list updated.  Objective:   Vitals:   10/25/19 0832  BP: 119/67  Pulse: 73  Weight: 192 lb 9.6 oz (87.4 kg)    Fetal Status: Fetal Heart Rate (bpm): 156 Fundal Height: 27 cm Movement: Present  Presentation: Undeterminable  General:  Alert, oriented and cooperative. Patient is in no acute distress.  Skin: Skin is warm and dry. No rash noted.   Cardiovascular: Normal heart rate noted  Respiratory: Normal respiratory effort, no problems with respiration noted  Abdomen: Soft, gravid, appropriate for gestational age. Pain/Pressure: Present     Pelvic: Vag. Bleeding: None     Cervical exam deferred        Extremities: Normal range of motion.  Edema: Trace  Mental Status: Normal mood and affect. Normal behavior. Normal judgment and thought content.   Urinalysis:      Assessment and Plan:  Pregnancy: G1P0 at [redacted]w[redacted]d  1. Supervision of high risk pregnancy, antepartum - Tdap vaccine greater than or equal to 7yo IM - 2 hr GTT  today  2. Bipolar affective disorder, remission status unspecified (HCC) - taking Zoloft and Seroquel- stable - scheduled with counselor and psychiatrist next month  3. Excessive weight gain during pregnancy in second trimester - total 39 lbs - discussed reducing carbs and eliminating refined sugars in diet  Preterm labor symptoms and general obstetric precautions including but not limited to vaginal bleeding, contractions, leaking of fluid and fetal movement were reviewed in detail with the patient. Please refer to After Visit Summary for other counseling recommendations.  Return in about 3 weeks (around 11/15/2019).   Donette Larry, CNM

## 2019-10-25 NOTE — Patient Instructions (Addendum)
Transportation Services If you are in need of transportation to get to and from your appointments in our office.  You can reach Transportation Services by calling [phone] Monday - Friday  7am-6pm.   Delivery Location [facility] [address] [place], [place], [place] [address]   Primary care offices accepting new patients:   Primary Care at [facility] [address] [place], [place] [address] [phone]  [facility] at [facility] [address] [place], [place] [address] [phone]  [facility] [address] [place], [place] [address] [phone]  [facility] [address] [place], [place] [address] [phone]    AREA PEDIATRIC/FAMILY PRACTICE PHYSICIANS  Central/Southeast [place] [phone]) . [facility] Melodie Bouillon, MD; Lum Babe, MD; Sheffield Slider, MD; Leveda Anna, MD; McDiarmid, MD; Jerene Bears, MD; Jennette Kettle, MD; Gwendolyn Grant, MD o [address] [place], [place] [address] o [phone] o Mon-Fri 8:30-12:30, 1:30-5:00 o Providers come to see babies at [facility] o Accepting Medicaid . [facility] o Limited providers who accept newborns: Docia Chuck, MD; Kateri Plummer, MD; Paulino Rily, MD o [address], [place], [place] [address] o [phone] o Mon-Fri 8:00-5:30 o Babies seen by providers at [facility] o Does NOT accept Medicaid o Please call early in hospitalization for appointment (limited availability)  . [facility] Fatima Sanger, MD o [address], [place], [place] [address] o [phone] o Mon, Tue, Thur, Fri 8:30-5:00, Wed 10:00-7:00 (closed 1-2pm) o Babies seen by [facility] providers o Accepting Medicaid . Donnie Coffin - Pediatrician Fae Pippin, MD o [address], [place], [place] [address] o [phone] o Mon-Fri 8:30-5:00, Sat 8:30-12:00 o Provider comes to  see babies at [facility] o Accepting Medicaid o Must have been referred from current patients or contacted office prior to delivery . [facility]) Leotis Pain, MD; Ave Filter, MD; Luna Fuse, MD; Kennedy Bucker, MD; Konrad Dolores, MD; Kathlene November, MD; Jenne Campus, MD; Lubertha South, MD; Wynetta Emery, MD; Duffy Rhody, MD; Gerre Couch, NP; Shirl Harris, NP o [address] [place], [place], [place] [address] o [phone] o Mon, Tue, Thur, Fri 8:30-5:30, Wed 9:30-5:30, Sat 8:30-12:30 o Babies seen by [facility] providers o Accepting Medicaid o Only accepting infants of first-time parents or siblings of current patients [facility] discharge coordinator will make follow-up appointment . Cyril Mourning o [address] [place], [place]  [address] o [phone]   Fax - [phone] . [facility] o [address], [place], [place]  [address] o Phone - [phone]   Fax - [phone] . Lucio Edward o [address], [place], [place]  [address] o [phone]  East/[place]ortheast [place] [phone]) . [facility] Jorge Mandril, MD; Alita Chyle, MD; Princella Ion, MD; MD; Earlene Plater, MD; Jamesetta Orleans, MD; Alvera Novel, MD; Clarene Duke, MD; Rana Snare, MD; Carmon Ginsberg, MD; Alinda Money, MD; Hosie Poisson, MD; Mayford Knife, MD o [address] [place], [place] [address] o [phone] o Mon-Fri 8:30-5:00 (extended evenings Mon-Thur as needed), Sat-Sun 10:00-1:00 o Providers come to see babies at [facility] o Accepting Medicaid for families of first-time babies and families with all children in the household age 96 and under. Must register with office prior to making appointment (M-F only). [facility] Odella Aquas, NP; Lynelle Doctor, MD; Susann Givens, MD; [place], [place] o [address], [place], [place] [address] o [phone] o Mon-Fri 8:00-5:00 o Babies seen by providers at [facility]  o Does NOT accept Medicaid/Commercial Insurance Only . [facility]  Pediatrics at [place] ([facility])  Suzette Battiest, MD; Zachery Dauer, MD; Stefan Church, MD; Sabino Dick, MD; Quitman Livings, MD; Farris Has, MD; Gaynell Face, MD; Betha Loa, MD; Colon Flattery, MD; Clifton James, MD o [address] [place], [place] [address] o [phone] o Mon-Fri 8:30-5:30, Sat (Oct.-Mar.) 9:00-1:00 o Babies seen by providers at [facility] o Accepting [facility] [phone]) . [facility] Dimitri, MD; Sheliah Hatch, MD o [address], [place], [place] [address] o [phone] o Mon-Fri 8:30-5:00, Sat 8:30-12:00 o Providers come to see babies at [facility] o Does NOT accept Medicaid . [facility] Cindy Hazy, [place]; [place], MD; [place], [place]; Wynelle Link, MD; Azucena Cecil, MD o [address] [place], [place] [address] o [phone] o Mon-Fri 8:00-5:00 o Babies seen by providers at [facility] o Does NOT accept Medicaid o Only accepting babies of parents who are patients o Please call early in hospitalization for appointment (limited availability) . [facility] Pediatricians Lamar Benes, MD; Abran Cantor, MD; Early Osmond, MD; Cherre Huger, NP; Hyacinth Meeker, MD; Dwan Bolt, MD; Jarold Motto, NP; Dario Guardian, MD; Talmage Nap, MD; Maisie Fus, MD; Pricilla Holm, MD; Tama High, MD o [address] [place], [place], [place] [address] o [phone] o Mon-Fri 8:00-5:00, Sat 9:00-12:00 o Providers come to see babies at [facility] o Does NOT accept [facility] [phone]) . [facility] o Limited providers accepting new patients: Drema Pry, NP; [place], PA o [address], [place], [place] [address] o [phone] o Mon-Fri 8:00-5:00 o Babies seen by providers at [facility] o Does NOT accept Medicaid o Only accepting babies of parents who are patients o Please call early in hospitalization for appointment (limited availability) . [facility] Luan Pulling, MD; Nash Dimmer, MD o [address] [place] [place], [place] [address] o [phone]  (press 1 to schedule appointment) o Mon-Fri 8:00-5:00 o Providers come to see babies at [facility] o Does NOT accept Medicaid . [facility] Cristino Martes, MD o [address], [place], [place] [address] o [phone] o Mon-Fri 8:30-5:00 (lunch 12:30-1:00), extended hours by appointment only Wed 5:00-6:30 o Babies seen by [facility] providers o Accepting Medicaid .  HealthCare at Gwenevere Abbot, MD; [place], MD; Hassan Rowan, MD o [address], [place], [place] [address] o [phone] o Mon-Fri 8:00-5:00 o Babies seen by [facility] providers o Does NOT accept Medicaid . Nature conservation officer at [place] [address] [place] Soho, MD; Durene Cal, MD; [place], DO o [address] [place], [place] [address] o [phone] o Mon-Fri 8:00-5:00 o Babies seen by [facility] providers o Does NOT accept Medicaid . [facility] o [place], [place]; [place], [place]; [place], NP; Avis Epley, MD; Vonna Kotyk, MD; Clance Boll, MD; Stevphen Rochester, NP; Arvilla Market, NP; Ann Maki, NP; Otis Dials, NP; Vaughan Basta, MD; [place], MD o [address] [place], [place] [address] o [phone] o Mon-Fri 8:30-5:00, Sat 10:00-1:00 o Providers come to see babies at [facility] o Does NOT accept Medicaid o Free prenatal information session Tuesdays at 4:45pm . [place] Luna Kitchens, MD; [place], [place]; [place], [place]; [place], [place] o [address] [place] [address] o [phone] o Mon-Fri 7:30-5:30 o Babies seen by [facility] providers . [facility] o [address], [place], [place]  [address] o [phone]   Fax - [phone]  [place] [phone] & [phone]) . [facility] Alphonsa Overall, MD o [address] [place], [place] [address]  o [phone] o Mon-Thur 8:00-6:00 o Providers come to see babies at [facility] o Accepting Medicaid . [facility] Zenon Mayo, NP; Cyndia Bent, MD; [place], [place]; [place],  [place] o [address] [place], [place] [address] o [phone] o Mon-Thur 7:30-7:30, Fri 7:30-4:30 o Babies seen by [facility] providers o Accepting Medicaid . [facility] Cheryle Horsfall, MD; Janene Harvey, NP; Vonita Moss, MD o [address], [place], [place] [address] o [phone] o Mon-Fri 8:30-5:00, Sat 8:30-12:00 o Providers come to see babies at [facility] o Accepting Medicaid o Must have "Meet & Greet" appointment at office prior to delivery . [facility] [place] [facility] Llana Aliment, MD; Earlene Plater, MD; Lucretia Roers, MD o [address], [place], [place] [address] o [phone] o Mon-Wed 8:00-6:00, Thur-Fri 8:00-5:00, Sat 9:00-12:00 o Providers come to see babies at [facility] o Does NOT accept Medicaid o Only accepting siblings of current patients . [facility]  o [address], [place], [place]  [address] o [phone]   Fax - [phone] . [facility] at [facility] o [phone] [address], [place], [place]  [address] o [phone]   Fax - [phone]  [place] [phone] & [phone]) . Nature conservation officer at [facility] o [place], DO; [name], DO o [address] [place], [place] [address] o [phone] o Mon-Fri 7:00-5:00 o Babies seen by [facility] providers o Does NOT accept Medicaid . [facility] Ellis Savage, MD; [place], [place]; [place], [place] o [address], [place], [place] [address] o [phone] o Mon-Fri 8:00-5:00 o Babies seen by [facility] providers o Accepting Medicaid . [facility] - [address] Franne Forts, MD; [place], [place]; [place], NP; [place], [place] o [address] [place], [place], [place] [address] o [phone] o Mon-Fri 8:00-5:00 o Babies seen by providers at [facility] o Accepting [facility] [place]  [phone]) . [facility] Regino Ramirez, [place] o [address] [place], [place] [address] o [phone] o Mon-Fri 8:00-5:00 o Babies seen by [facility] providers o Does NOT accept Medicaid o Limited availability, please call early in hospitalization to schedule follow-up . [facility] Jolee Ewing, PA; Eddie Candle, MD; [place], MD; [place], [place]; Constance Goltz, MD; [place], [place] o [facility] [address], [place], [place] [address] o [phone] o Mon-Fri 8:30-5:00, Sat 9:00-12:00 o Babies seen by providers at [facility] o Accepting Medicaid o Please register online then schedule online or call office o [email or web address] . [facility]) Samuella Bruin, NP; Lucianne Muss, MD; Lanier Clam, PA o [address], [place], [place] [address] o [phone] o Mon-Fri 8:00-5:00 o Babies seen by providers at [facility] o Accepting Medicaid . [facility] ([facility]) Sharin Mons, MD; Reed Breech, NP; Shelva Majestic, MD o [address], [place], [place] [address] o [phone] o Mon-Fri 8:00-5:30, Sat&Sun by appointment (phones open at 8:30) o Babies seen by [facility] providers o Accepting Medicaid o Must be a first-time baby or sibling of current patient . [facility] [address], [place], [place]  [address] o [phone]   Fax - [phone]  [facility] [phone] & [phone]) . [facility] o [place], [place]; [place], [place]; Dimple Casey, MD; [place], [place]; [place],  MD o [address] [place], [place] [address] o [phone] o Mon-Thur 8:00-7:00, Fri 8:00-5:00, Sat 8:00-12:00, Sun 9:00-12:00 o Babies seen by [facility] providers o Accepting Medicaid . [facility] at [facility], MD; Ruthann Cancer, MD; [facility], MD o [address], [place],  [place] [address] o [phone] o Mon-Thur 8:00-5:00 o Babies seen by providers at [facility] o Accepting Medicaid . [facility] at [place], MD; Coe[place], MD; Amedeo Plenty, MD; Bobby Rumpf, MD; List, MD; Lavonia Drafts, MD; Ruthann Cancer, MD; Selinda Eon, MD; Audie Box, MD; Jim Like, MD; [name] [place], MD; Hubbard Hartshorn, MD; Modena Nunnery, MD o [place] [place], [place] [address] o [phone] o Mon-Fri 8:00-5:30, Sat (Oct.-Mar.) 9:00-1:00 o Babies seen by providers at [facility] o Accepting Medicaid o Must fill out new patient packet, available online at [email or web address] . [place] ([facility]) Barnabas Lister, NP; [place] Kingfisher, NP; Claiborne Billings, NP; Rolla Plate, MD; [place], [place]; Carola Rhine, MD; Tyron Russell, MD; Delia Chimes, NP o [address], [place], [place] [address] o [phone] o Mon-Thur 8:00-5:30, Fri 8:00-5:00 o Babies seen by providers at [place] [phone]) . [place], [place]; [place], MD; Dennard Schaumann, MD; [place], [place] o [address] [name], [place] [address] o [phone] o Mon-Fri 8:00-5:00 o Babies seen by providers at [place] [phone]) . [place], [place]; Olen Pel, MD; [place], [place], [place], [place] [address] o [phone] o Mon-Fri 8:00-5:00 o Babies seen by providers at [facility] o Does NOT accept Medicaid o Limited appointment availability, please call early in hospitalization  . Therapist, music at [place], [place]; [place], [place] [address], [place], [place] [address] o [phone] o Mon-Fri 8:00-5:00 o Babies seen by [facility] providers o Does NOT accept Medicaid . [facility] Su Grand, MD; Guy Sandifer, MD; [place], [place]; [place], [place] [address], [place], [place]  [address] o [phone] o Mon-Fri 8:00-5:00 o After hours clinic [facility] [place], [place] [address]) [phone] Mon-Fri 5:00-8:00, Sat 12:00-6:00, Sun 10:00-4:00 o Babies seen by [facility] providers o Accepting Medicaid . [place] [facility] o [address] [place], [place]  [address] o [phone]   Fax - [phone]  [place] [phone]) . Therapist, music at [facility], MD o [address] [place], [place], [place] [address] o [phone] o Mon-Fri 8:00-5:00 o Babies seen by [facility] providers o Does NOT accept Medicaid . [place] ([facility] [place]) Bing Neighbors, MD o [address] [place], [place], [place] [address] o [phone] o Mon-Thur 8:00-7:00, Fri 8:00-5:00, Sat 8:00-12:00 o Babies seen by providers at [facility] o Accepting Medicaid - but does not have vaccinations in office (must be received elsewhere) o Limited availability, please call early in hospitalization  [place] ([phone]) . [place], [place] [address], [place] [address] o [phone]  Fax [phone]   Third Trimester of Pregnancy  The third trimester is from week 28 through week 40 (months 7 through 9). This trimester is when your unborn baby (fetus) is growing very fast. At the end of the ninth month, the unborn baby  is about 20 inches in length. It weighs about 6-10 pounds. Follow these instructions at home: Medicines  Take over-the-counter and prescription medicines only as told by your doctor. Some medicines are safe and some medicines are not safe during pregnancy.  Take a prenatal vitamin that contains at least 600 micrograms (mcg) of folic acid.  If you have trouble pooping (constipation), take medicine that will make your stool soft (stool softener) if your doctor approves. Eating and drinking   Eat regular, healthy  meals.  Avoid raw meat and uncooked cheese.  If you get low calcium from the food you eat, talk to your doctor about taking a daily calcium supplement.  Eat four or five small meals rather than three large meals a day.  Avoid foods that are high in fat and sugars, such as fried and sweet foods.  To prevent constipation: ? Eat foods that are high in fiber, like fresh fruits and vegetables, whole grains, and beans. ? Drink enough fluids to keep your pee (urine) clear or pale yellow. Activity  Exercise only as told by your doctor. Stop exercising if you start to have cramps.  Avoid heavy lifting, wear low heels, and sit up straight.  Do not exercise if it is too hot, too humid, or if you are in a place of great height (high altitude).  You may continue to have sex unless your doctor tells you not to. Relieving pain and discomfort  Wear a good support bra if your breasts are tender.  Take frequent breaks and rest with your legs raised if you have leg cramps or low back pain.  Take warm water baths (sitz baths) to soothe pain or discomfort caused by hemorrhoids. Use hemorrhoid cream if your doctor approves.  If you develop puffy, bulging veins (varicose veins) in your legs: ? Wear support hose or compression stockings as told by your doctor. ? Raise (elevate) your feet for 15 minutes, 3-4 times a day. ? Limit salt in your food. Safety  Wear your seat belt when driving.  Make a list of emergency phone numbers, including numbers for family, friends, the hospital, and police and fire departments. Preparing for your baby's arrival To prepare for the arrival of your baby:  Take prenatal classes.  Practice driving to the hospital.  Visit the hospital and tour the maternity area.  Talk to your work about taking leave once the baby comes.  Pack your hospital bag.  Prepare the baby's room.  Go to your doctor visits.  Buy a rear-facing car seat. Learn how to install it in  your car. General instructions  Do not use hot tubs, steam rooms, or saunas.  Do not use any products that contain nicotine or tobacco, such as cigarettes and e-cigarettes. If you need help quitting, ask your doctor.  Do not drink alcohol.  Do not douche or use tampons or scented sanitary pads.  Do not cross your legs for long periods of time.  Do not travel for long distances unless you must. Only do so if your doctor says it is okay.  Visit your dentist if you have not gone during your pregnancy. Use a soft toothbrush to brush your teeth. Be gentle when you floss.  Avoid cat litter boxes and soil used by cats. These carry germs that can cause birth defects in the baby and can cause a loss of your baby (miscarriage) or stillbirth.  Keep all your prenatal visits as told by your doctor. This is important. Contact  a doctor if:  You are not sure if you are in labor or if your water has broken.  You are dizzy.  You have mild cramps or pressure in your lower belly.  You have a nagging pain in your belly area.  You continue to feel sick to your stomach, you throw up, or you have watery poop.  You have bad smelling fluid coming from your vagina.  You have pain when you pee. Get help right away if:  You have a fever.  You are leaking fluid from your vagina.  You are spotting or bleeding from your vagina.  You have severe belly cramps or pain.  You lose or gain weight quickly.  You have trouble catching your breath and have chest pain.  You notice sudden or extreme puffiness (swelling) of your face, hands, ankles, feet, or legs.  You have not felt the baby move in over an hour.  You have severe headaches that do not go away with medicine.  You have trouble seeing.  You are leaking, or you are having a gush of fluid, from your vagina before you are 37 weeks.  You have regular belly spasms (contractions) before you are 37 weeks. Summary  The third trimester is from  week 28 through week 40 (months 7 through 9). This time is when your unborn baby is growing very fast.  Follow your doctor's advice about medicine, food, and activity.  Get ready for the arrival of your baby by taking prenatal classes, getting all the baby items ready, preparing the baby's room, and visiting your doctor to be checked.  Get help right away if you are bleeding from your vagina, or you have chest pain and trouble catching your breath, or if you have not felt your baby move in over an hour. This information is not intended to replace advice given to you by your health care provider. Make sure you discuss any questions you have with your health care provider. Document Revised: 11/16/2018 Document Reviewed: 08/31/2016 Elsevier Patient Education  2020 [employer].

## 2019-10-26 LAB — GLUCOSE TOLERANCE, 2 HOURS W/ 1HR
Glucose, 1 hour: 93 mg/dL (ref 65–179)
Glucose, 2 hour: 74 mg/dL (ref 65–152)
Glucose, Fasting: 74 mg/dL (ref 65–91)

## 2019-10-26 LAB — CBC
Hematocrit: 38 % (ref 34.0–46.6)
Hemoglobin: 13.1 g/dL (ref 11.1–15.9)
MCH: 33.6 pg — ABNORMAL HIGH (ref 26.6–33.0)
MCHC: 34.5 g/dL (ref 31.5–35.7)
MCV: 97 fL (ref 79–97)
Platelets: 159 10*3/uL (ref 150–450)
RBC: 3.9 x10E6/uL (ref 3.77–5.28)
RDW: 13.2 % (ref 11.7–15.4)
WBC: 7.8 10*3/uL (ref 3.4–10.8)

## 2019-10-26 LAB — RPR: RPR Ser Ql: NONREACTIVE

## 2019-10-26 LAB — HIV ANTIBODY (ROUTINE TESTING W REFLEX): HIV Screen 4th Generation wRfx: NONREACTIVE

## 2019-10-29 ENCOUNTER — Ambulatory Visit (HOSPITAL_COMMUNITY)
Admission: RE | Admit: 2019-10-29 | Discharge: 2019-10-29 | Disposition: A | Discharge status: Home / Self Care | Payer: Medicaid Other | Source: Ambulatory Visit | Attending: Obstetrics and Gynecology | Admitting: Obstetrics and Gynecology

## 2019-10-29 ENCOUNTER — Other Ambulatory Visit: Payer: Self-pay

## 2019-10-29 ENCOUNTER — Other Ambulatory Visit (HOSPITAL_COMMUNITY): Payer: Self-pay | Admitting: *Deleted

## 2019-10-29 DIAGNOSIS — J45909 Unspecified asthma, uncomplicated: Secondary | ICD-10-CM

## 2019-10-29 DIAGNOSIS — O99891 Other specified diseases and conditions complicating pregnancy: Secondary | ICD-10-CM | POA: Diagnosis not present

## 2019-10-29 DIAGNOSIS — O99212 Obesity complicating pregnancy, second trimester: Secondary | ICD-10-CM

## 2019-10-29 DIAGNOSIS — O358XX0 Maternal care for other (suspected) fetal abnormality and damage, not applicable or unspecified: Secondary | ICD-10-CM

## 2019-10-29 DIAGNOSIS — Z3A27 27 weeks gestation of pregnancy: Secondary | ICD-10-CM

## 2019-10-29 DIAGNOSIS — Z3689 Encounter for other specified antenatal screening: Secondary | ICD-10-CM

## 2019-10-29 DIAGNOSIS — Z363 Encounter for antenatal screening for malformations: Secondary | ICD-10-CM | POA: Insufficient documentation

## 2019-10-31 ENCOUNTER — Telehealth: Payer: Self-pay | Admitting: Clinical

## 2019-10-31 NOTE — Telephone Encounter (Signed)
Walked Pt through steps to retrieve her previous AVS from [facility] now that her MyChart is set up. Pt was able to find AVS, and said that is what she was trying to look for. Pt had no further questions.

## 2019-11-04 ENCOUNTER — Inpatient Hospital Stay (HOSPITAL_COMMUNITY)
Admission: AD | Admit: 2019-11-04 | Discharge: 2019-11-04 | Disposition: A | Discharge status: Home / Self Care | Payer: Medicaid Other | Attending: Obstetrics & Gynecology | Admitting: Obstetrics & Gynecology

## 2019-11-04 ENCOUNTER — Encounter (HOSPITAL_COMMUNITY): Payer: Self-pay | Admitting: Obstetrics & Gynecology

## 2019-11-04 ENCOUNTER — Other Ambulatory Visit: Payer: Self-pay

## 2019-11-04 DIAGNOSIS — F419 Anxiety disorder, unspecified: Secondary | ICD-10-CM | POA: Diagnosis not present

## 2019-11-04 DIAGNOSIS — Z3A30 30 weeks gestation of pregnancy: Secondary | ICD-10-CM | POA: Insufficient documentation

## 2019-11-04 DIAGNOSIS — Z3689 Encounter for other specified antenatal screening: Secondary | ICD-10-CM | POA: Diagnosis not present

## 2019-11-04 DIAGNOSIS — O99513 Diseases of the respiratory system complicating pregnancy, third trimester: Secondary | ICD-10-CM | POA: Insufficient documentation

## 2019-11-04 DIAGNOSIS — J45909 Unspecified asthma, uncomplicated: Secondary | ICD-10-CM | POA: Diagnosis not present

## 2019-11-04 DIAGNOSIS — F329 Major depressive disorder, single episode, unspecified: Secondary | ICD-10-CM | POA: Insufficient documentation

## 2019-11-04 DIAGNOSIS — O99333 Smoking (tobacco) complicating pregnancy, third trimester: Secondary | ICD-10-CM | POA: Diagnosis not present

## 2019-11-04 DIAGNOSIS — Z79899 Other long term (current) drug therapy: Secondary | ICD-10-CM | POA: Insufficient documentation

## 2019-11-04 DIAGNOSIS — Z3A28 28 weeks gestation of pregnancy: Secondary | ICD-10-CM

## 2019-11-04 DIAGNOSIS — F17210 Nicotine dependence, cigarettes, uncomplicated: Secondary | ICD-10-CM | POA: Diagnosis not present

## 2019-11-04 DIAGNOSIS — O99343 Other mental disorders complicating pregnancy, third trimester: Secondary | ICD-10-CM | POA: Diagnosis not present

## 2019-11-04 DIAGNOSIS — O368130 Decreased fetal movements, third trimester, not applicable or unspecified: Secondary | ICD-10-CM | POA: Insufficient documentation

## 2019-11-04 NOTE — Discharge Instructions (Signed)
Fetal Movement Counts Patient Name: ________________________________________________ Patient Due Date: ____________________ What is a fetal movement count?  A fetal movement count is the number of times that you feel your baby move during a certain amount of time. This may also be called a fetal kick count. A fetal movement count is recommended for every pregnant woman. You may be asked to start counting fetal movements as early as week 28 of your pregnancy. Pay attention to when your baby is most active. You may notice your baby's sleep and wake cycles. You may also notice things that make your baby move more. You should do a fetal movement count:  When your baby is normally most active.  At the same time each day. A good time to count movements is while you are resting, after having something to eat and drink. How do I count fetal movements? 1. Find a quiet, comfortable area. Sit, or lie down on your side. 2. Write down the date, the start time and stop time, and the number of movements that you felt between those two times. Take this information with you to your health care visits. 3. Write down your start time when you feel the first movement. 4. Count kicks, flutters, swishes, rolls, and jabs. You should feel at least 10 movements. 5. You may stop counting after you have felt 10 movements, or if you have been counting for 2 hours. Write down the stop time. 6. If you do not feel 10 movements in 2 hours, contact your health care provider for further instructions. Your health care provider may want to do additional tests to assess your baby's well-being. Contact a health care provider if:  You feel fewer than 10 movements in 2 hours.  Your baby is not moving like he or she usually does. Date: ____________ Start time: ____________ Stop time: ____________ Movements: ____________ Date: ____________ Start time: ____________ Stop time: ____________ Movements: ____________ Date: ____________  Start time: ____________ Stop time: ____________ Movements: ____________ Date: ____________ Start time: ____________ Stop time: ____________ Movements: ____________ Date: ____________ Start time: ____________ Stop time: ____________ Movements: ____________ Date: ____________ Start time: ____________ Stop time: ____________ Movements: ____________ Date: ____________ Start time: ____________ Stop time: ____________ Movements: ____________ Date: ____________ Start time: ____________ Stop time: ____________ Movements: ____________ Date: ____________ Start time: ____________ Stop time: ____________ Movements: ____________ This information is not intended to replace advice given to you by your health care provider. Make sure you discuss any questions you have with your health care provider. Document Revised: 03/15/2019 Document Reviewed: 03/15/2019 Elsevier Patient Education  2020 Else[employer] Inc[employer]  

## 2019-11-04 NOTE — MAU Provider Note (Signed)
History     CSN: [id number]  Arrival date and time: 11/04/19 1502   First Provider Initiated Contact with Patient 11/04/19 1638      Chief Complaint  Patient presents with  . Decreased Fetal Movement   HPI Sharon Ward is a 24 y.o. G1P0 at 1w3dwho presents to MAU with chief complaint of absent fetal movement. Patient states she detected less movement than normal beginning Friday 11/02/2019. Last night she detected about eight movements in one hour but she has not felt any fetal movement today or in MAU. She endorses eating breakfast, a mid-morning snack, and a small portion of her lunch. She states she also tried lying on her left side and drinking cold water but was unable to trigger fetal movement.  She denies abdominal pain, vaginal bleeding, leaking of fluid, fever or recent illness.  [facility]  OB History    Gravida  1   Para  0   Term      Preterm      AB      Living        SAB      TAB      Ectopic      Multiple      Live Births              Past Medical History:  Diagnosis Date  . Anxiety   . Asthma   . Depression     Past Surgical History:  Procedure Laterality Date  . PILONIDAL CYST / SINUS EXCISION  09/11/2013  . PILONIDAL CYST EXCISION  05/17/2014   Pilonidal cystectomy with cleft lip    Family History  Problem Relation Age of Onset  . Healthy Mother   . Healthy Father     Social History   Tobacco Use  . Smoking status: Current Some Day Smoker  . Smokeless tobacco: Never Used  . Tobacco comment: only smoke a "couple" of cigarettes when stressed or anxious, socially with [facility]  Substance Use Topics  . Alcohol use: Not Currently    Comment: occasional prior to pregnancy  . 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 Citrus   .  Coconut Flavor   . Lamotrigine   . Orange (Diagnostic)   . Peach Flavor   . Pear   . Pineapple     Medications Prior to Admission  Medication Sig Dispense Refill Last Dose  . Blood Pressure Monitoring (BLOOD PRESSURE KIT) DEVI 1 Device by Does not apply route as needed. 1 each 0   . carbonyl iron (FEOSOL NATURAL RELEASE) 45 MG TABS tablet Take 45 mg by mouth.     . diphenhydrAMINE (BENADRYL) 50 MG capsule Take 50 mg by mouth every 6 (six) hours as needed.     . Doxylamine-Pyridoxine (DICLEGIS) 10-10 MG TBEC Take by mouth.     . ferrous sulfate 325 (65 FE) MG tablet Take 325 mg by mouth daily with breakfast.     . folic acid (FOLVITE) 4323MCG tablet Take 400 mcg by mouth daily.     .Marland KitchenLACTOBACILLUS RHAMNOSUS, GG, PO Take 1 capsule by mouth daily.     . Prenatal Vit-Fe Fumarate-FA (MULTIVITAMIN-PRENATAL) 27-0.8 MG TABS tablet Take 1 tablet by mouth daily at 12 noon.     .Marland Kitchen  QUEtiapine (SEROQUEL) 50 MG tablet Take 50 mg by mouth at bedtime.     . sertraline (ZOLOFT) 50 MG tablet Take 50 mg by mouth daily.       Review of Systems  Constitutional: Negative for fever.  Gastrointestinal: Negative for abdominal pain.  Genitourinary: Negative for vaginal bleeding.  Musculoskeletal: Negative for back pain.  All other systems reviewed and are negative.  Physical Exam   Blood pressure 125/71, pulse 87, temperature 98.4 F (36.9 C), temperature source Oral, resp. rate 16, last menstrual period 04/19/2019, SpO2 94 %.  Physical Exam  Nursing note and vitals reviewed. Constitutional: She is oriented to person, place, and time. She appears well-developed and well-nourished.  Cardiovascular: Normal rate and normal heart sounds.  Respiratory: Effort normal and breath sounds normal. She has no decreased breath sounds.  GI: Soft. She exhibits no distension. There is no abdominal tenderness. There is no rebound, no guarding and no CVA tenderness.  Neurological: She is alert and oriented to person,  place, and time.  Skin: Skin is warm and dry.  Psychiatric: She has a normal mood and affect. Her behavior is normal. Judgment and thought content normal.    MAU Course/MDM  Procedures  --Patient given NST button to push with fetal movement. States she is unable to detect any fetal movement during her evaluation in MAU. No improvement after repositioning and juice. --Audible and palpable fetal movement throughout time in MAU --Patient able to detect small fetal movement by gently palpating her own abdomen. --Discussed typical expectations for initiating kick counts at 30 weeks. --Reactive tracing: baseline 145, mod var, + accels, no decels --Toco: quiet  Patient Vitals for the past 24 hrs:  BP Temp Temp src Pulse Resp SpO2  11/04/19 1641 125/71 -- -- 87 16 --  11/04/19 1529 (!) 109/44 98.4 F (36.9 C) Oral 81 16 94 %   Assessment and Plan  --24 y.o. G1P0 at [redacted]w[redacted]d --Reactive tracing, very active fetal movement --Patient reassured she may not be able to count kicks until 30 weeks --Discharge home in stable condition  F/U: --CRegal04/01/2020  SDarlina Rumpf CSharpsville3/28/2021, 7:02 PM

## 2019-11-04 NOTE — MAU Note (Signed)
Pt reports to mau reporting dfm since Friday.  Pt states she felt baby a few times Friday morning and about 8 times last  Night, and only 3 times today.  Pt denies lof or vaginal bleeding.  Pt denies ctx or pain at this time

## 2019-11-06 ENCOUNTER — Telehealth: Payer: Self-pay | Admitting: Family Medicine

## 2019-11-08 ENCOUNTER — Telehealth: Payer: Self-pay | Admitting: Family Medicine

## 2019-11-08 NOTE — Telephone Encounter (Signed)
Called pt and discussed her concern. She stated that she has noticed a rash around her Lt nipple which started yesterday. She denies pain, itching or irritation. She has not changed any soaps however has started using some lotion and oil which is designed for stretch marks in pregnant women. She did not use the lotion last night and today noticed that the rash is lighter. I advised pt that she may be having an allergic reaction or possibly the area was too moist prior to putting clothes on. Pt was advised to stop using the lotion on her breasts for several days. If the rash goes away, she may decide whether she wants to try it again or not. If she does want to use it, she was advised to try it only a couple times per week. Also she should be sure to allow the area to dry completely before getting dressed. If the rash reappears anywhere on her body, she should cease using the product.  Pt voiced understanding of all information and instructions given.

## 2019-11-08 NOTE — Telephone Encounter (Signed)
Rash on the right side of her breast want to speak to someone about that.

## 2019-11-10 ENCOUNTER — Inpatient Hospital Stay (HOSPITAL_COMMUNITY)
Admission: AD | Admit: 2019-11-10 | Discharge: 2019-11-10 | Disposition: A | Discharge status: Home / Self Care | Payer: Medicaid Other | Attending: Emergency Medicine | Admitting: Emergency Medicine

## 2019-11-10 ENCOUNTER — Other Ambulatory Visit: Payer: Self-pay

## 2019-11-10 ENCOUNTER — Encounter (HOSPITAL_COMMUNITY): Payer: Self-pay | Admitting: Family Medicine

## 2019-11-10 DIAGNOSIS — M533 Sacrococcygeal disorders, not elsewhere classified: Secondary | ICD-10-CM | POA: Insufficient documentation

## 2019-11-10 DIAGNOSIS — O99891 Other specified diseases and conditions complicating pregnancy: Secondary | ICD-10-CM | POA: Diagnosis not present

## 2019-11-10 DIAGNOSIS — B951 Streptococcus, group B, as the cause of diseases classified elsewhere: Secondary | ICD-10-CM | POA: Diagnosis not present

## 2019-11-10 DIAGNOSIS — O09212 Supervision of pregnancy with history of pre-term labor, second trimester: Secondary | ICD-10-CM

## 2019-11-10 DIAGNOSIS — O0990 Supervision of high risk pregnancy, unspecified, unspecified trimester: Secondary | ICD-10-CM

## 2019-11-10 DIAGNOSIS — Z720 Tobacco use: Secondary | ICD-10-CM | POA: Insufficient documentation

## 2019-11-10 DIAGNOSIS — O98813 Other maternal infectious and parasitic diseases complicating pregnancy, third trimester: Secondary | ICD-10-CM | POA: Diagnosis not present

## 2019-11-10 DIAGNOSIS — J45909 Unspecified asthma, uncomplicated: Secondary | ICD-10-CM | POA: Insufficient documentation

## 2019-11-10 DIAGNOSIS — O99212 Obesity complicating pregnancy, second trimester: Secondary | ICD-10-CM

## 2019-11-10 DIAGNOSIS — Z79899 Other long term (current) drug therapy: Secondary | ICD-10-CM | POA: Diagnosis not present

## 2019-11-10 DIAGNOSIS — Z3A29 29 weeks gestation of pregnancy: Secondary | ICD-10-CM | POA: Insufficient documentation

## 2019-11-10 LAB — CBC WITH DIFFERENTIAL/PLATELET
Abs Immature Granulocytes: 0.1 10*3/uL — ABNORMAL HIGH (ref 0.00–0.07)
Basophils Absolute: 0 10*3/uL (ref 0.0–0.1)
Basophils Relative: 0 %
Eosinophils Absolute: 0 10*3/uL (ref 0.0–0.5)
Eosinophils Relative: 0 %
HCT: 40.7 % (ref 36.0–46.0)
Hemoglobin: 13.6 g/dL (ref 12.0–15.0)
Immature Granulocytes: 1 %
Lymphocytes Relative: 19 %
Lymphs Abs: 1.5 10*3/uL (ref 0.7–4.0)
MCH: 33.4 pg (ref 26.0–34.0)
MCHC: 33.4 g/dL (ref 30.0–36.0)
MCV: 100 fL (ref 80.0–100.0)
Monocytes Absolute: 0.8 10*3/uL (ref 0.1–1.0)
Monocytes Relative: 10 %
Neutro Abs: 5.5 10*3/uL (ref 1.7–7.7)
Neutrophils Relative %: 70 %
Platelets: 159 10*3/uL (ref 150–400)
RBC: 4.07 MIL/uL (ref 3.87–5.11)
RDW: 12.8 % (ref 11.5–15.5)
WBC: 8 10*3/uL (ref 4.0–10.5)
nRBC: 0 % (ref 0.0–0.2)

## 2019-11-10 LAB — BASIC METABOLIC PANEL
Anion gap: 10 (ref 5–15)
BUN: 6 mg/dL (ref 6–20)
CO2: 22 mmol/L (ref 22–32)
Calcium: 9 mg/dL (ref 8.9–10.3)
Chloride: 106 mmol/L (ref 98–111)
Creatinine, Ser: 0.44 mg/dL (ref 0.44–1.00)
GFR calc Af Amer: >60 mL/min (ref >60)
GFR calc non Af Amer: >60 mL/min (ref >60)
Glucose, Bld: 71 mg/dL (ref 70–99)
Potassium: 3.6 mmol/L (ref 3.5–5.1)
Sodium: 138 mmol/L (ref 135–145)

## 2019-11-10 LAB — URINALYSIS, ROUTINE W REFLEX MICROSCOPIC
Bilirubin Urine: NEGATIVE
Glucose, UA: 50 mg/dL — AB
Hgb urine dipstick: NEGATIVE
Ketones, ur: NEGATIVE mg/dL
Leukocytes,Ua: NEGATIVE
Nitrite: NEGATIVE
Protein, ur: NEGATIVE mg/dL
Specific Gravity, Urine: 1.011 (ref 1.005–1.030)
pH: 6 (ref 5.0–8.0)

## 2019-11-10 MED ORDER — CEPHALEXIN 500 MG PO CAPS: 500.0000 mg | ORAL_CAPSULE | Freq: Two times a day (BID) | ORAL | 0 refills | Status: AC

## 2019-11-10 NOTE — ED Provider Notes (Signed)
[facility] EMERGENCY DEPARTMENT Provider Note   CSN: [id number] Arrival date & time: 11/10/19  1047     History Chief Complaint  Patient presents with  . Cyst    Sharon Ward is a 24 y.o. female with PMHx anxiety/depression and currently [redacted] weeks pregnant who presents to the ED with concern for pilonidal cyst.  Patient endorses intermittent pain to her gluteal cleft area x2 weeks.  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  He has been taking Tylenol with mild relief however is concerned that she could have a cyst that needs to be drained today.  She denies any active drainage from her underwear.  Denies fevers or chills.  Patient was seen at the MAU today and sent here for further evaluation given she is not having any pregnancy complications.  She deferred a GU exam at that time.   The history is provided by the patient and medical records.       Past Medical History:  Diagnosis Date  . Anxiety   . Asthma   . Depression     Patient Active Problem List   Diagnosis Date Noted  . Excess weight gain in pregnancy 10/25/2019  . Supervision of high risk pregnancy, antepartum 09/28/2019  . Vaginal bleeding during pregnancy 09/28/2019  . Obesity affecting pregnancy 09/28/2019  . Late prenatal care affecting pregnancy in second trimester 09/28/2019  . Anxiety 09/18/2019  . Asthma 09/18/2019  . Bipolar disorder ([place]) 09/18/2019  . Depression 09/18/2019  . LGSIL on Pap smear of cervix 09/18/2019  . Oppositional defiant disorder 09/18/2019  . H/O psychiatric hospitalization 09/18/2019  . Seasonal allergies 09/18/2019  . Group beta Strep positive 07/02/2019    Past Surgical History:  Procedure Laterality Date  . PILONIDAL CYST / SINUS EXCISION  09/11/2013  . PILONIDAL CYST EXCISION  05/17/2014   Pilonidal cystectomy with cleft lip     OB History    Gravida  1   Para  0   Term      Preterm      AB        Living        SAB      TAB      Ectopic      Multiple      Live Births              Family History  Problem Relation Age of Onset  . Healthy Mother   . Healthy Father     Social History   Tobacco Use  . Smoking status: Current Some Day Smoker  . Smokeless tobacco: Never Used  . Tobacco comment: only smoke a "couple" of cigarettes when stressed or anxious, socially with friends per [facility] chart  Substance Use Topics  . Alcohol use: Not Currently    Comment: occasional prior to pregnancy  . Drug use: Not Currently    Types: Marijuana    Comment: reports use 4 or 5 times; last used 05/04/19    Home Medications Prior to Admission medications   Medication Sig Start Date End Date Taking? Authorizing Provider  Blood Pressure Monitoring (BLOOD PRESSURE KIT) DEVI 1 Device by Does not apply route as needed. 09/28/19   Caren Macadam, MD  carbonyl iron (FEOSOL NATURAL RELEASE) 45 MG TABS tablet Take 45 mg by mouth.    [provider]  cephALEXin (KEFLEX) 500 MG capsule Take 1 capsule (500 mg total) by mouth 2 (two)  times daily for 5 days. 11/10/19 11/15/19  Eustaquio Maize, PA-C  diphenhydrAMINE (BENADRYL) 50 MG capsule Take 50 mg by mouth every 6 (six) hours as needed.    [provider]  Doxylamine-Pyridoxine (DICLEGIS) 10-10 MG TBEC Take by mouth.    [provider]  ferrous sulfate 325 (65 FE) MG tablet Take 325 mg by mouth daily with breakfast.    [provider]  folic acid (FOLVITE) 951 MCG tablet Take 400 mcg by mouth daily.    [provider]  LACTOBACILLUS RHAMNOSUS, GG, PO Take 1 capsule by mouth daily.    [provider]  Prenatal Vit-Fe Fumarate-FA (MULTIVITAMIN-PRENATAL) 27-0.8 MG TABS tablet Take 1 tablet by mouth daily at 12 noon.    [provider]  QUEtiapine (SEROQUEL) 50 MG tablet Take 50 mg by mouth at bedtime.    [provider]  sertraline (ZOLOFT) 50 MG tablet Take 50 mg by mouth  daily.    [provider]    Allergies    Citrus, Coconut flavor, Lamotrigine, Orange (diagnostic), Peach flavor, Pear, and Pineapple  Review of Systems   Review of Systems  Constitutional: Negative for chills and fever.  Musculoskeletal:       + pain to gluteal cleft  All other systems reviewed and are negative.   Physical Exam Updated Vital Signs BP 113/80 (BP Location: Right Arm)   Pulse 89   Temp 98.1 F (36.7 C) (Oral)   Resp 16   Ht _0  (1.6 m)   Wt 90.7 kg   LMP 04/19/2019   SpO2 100% Comment: room air  BMI 35.41 kg/m   Physical Exam Vitals and nursing note reviewed.  Constitutional:      Appearance: She is not ill-appearing or diaphoretic.  HENT:     Head: Normocephalic and atraumatic.  Eyes:     Conjunctiva/sclera: Conjunctivae normal.  Cardiovascular:     Rate and Rhythm: Normal rate and regular rhythm.     Pulses: Normal pulses.  Pulmonary:     Effort: Pulmonary effort is normal.     Breath sounds: Normal breath sounds. No wheezing, rhonchi or rales.  Abdominal:     Palpations: Abdomen is soft.     Tenderness: There is no abdominal tenderness. There is no guarding or rebound.  Genitourinary:    Comments: Chaperone present for exam. Surgical scar noted to gluteal cleft. No erythema, edema, induration, or fluctuance noted. Very mild TTP over surgical incision.  Musculoskeletal:     Cervical back: Neck supple.  Skin:    General: Skin is warm and dry.  Neurological:     Mental Status: She is alert.     ED Results / Procedures / Treatments   Labs (all labs ordered are listed, but only abnormal results are displayed) Labs Reviewed  CBC WITH DIFFERENTIAL/PLATELET - Abnormal; Notable for the following components:      Result Value   Abs Immature Granulocytes 0.10 (*)    All other components within normal limits  URINALYSIS, ROUTINE W REFLEX MICROSCOPIC - Abnormal; Notable for the following components:   APPearance CLOUDY (*)    Glucose,  UA 50 (*)    All other components within normal limits  BASIC METABOLIC PANEL    EKG None  Radiology No results found.  Procedures Procedures (including critical care time)  Medications Ordered in ED Medications - No data to display  ED Course  I have reviewed the triage vital signs and the nursing notes.  Pertinent labs &  imaging results that were available during my care of the patient were reviewed by me and considered in my medical decision making (see chart for details).    MDM Rules/Calculators/A&P                      24 year old female who presents to the ED with concern for possible recurrent pilonidal cyst.  States she has been having intermittent pain for the past 2 weeks.  Is currently [redacted] weeks pregnant.  Sent by MAU for further evaluation.  She reports history of pilonidal cyst in the past that have required surgical drainage in 2015 in 9518 however no complications since then.  Patient declined GU exam at MAU.  Chaperone present for my exam.  There is no obvious erythema, edema to the area.  Patient does have a surgical scar noted with very mild tenderness to palpation.  I do not feel like there is anything to drain today.  Given patient is currently pregnant will obtain screening labs to ensure she does not have a leukocytosis.  Will check urine.  Rival to the ED patient is afebrile, nontachycardic and nontachypneic.  She appears to be in no acute distress and is nontoxic-appearing.  CBC without leukocytosis today.  Hemoglobin stable.  BMP without electrolyte abnormalities.  Urinalysis without infection.  Lab work reassuring at this time.  Patient is heavily concerned that her gluteal cleft could turn into a pilonidal cyst.  She does not have a PCP to follow-up with.  I will empirically prescribe antibiotics.  Patient advised to continue monitoring area and if any erythema or swelling develops or drainage patient did then start taking the antibiotic.  She is advised to  take Tylenol as needed for pain given she is pregnant and unable to take anything else.  Patient given information for [facility] and wellness to schedule an appointment to establish care with a primary care provider.  She states she currently only sees OB/GYN given she is pregnant.  Stable for discharge at this time.   This note was prepared using Dragon voice recognition software and may include unintentional dictation errors due to the inherent limitations of voice recognition software.  Final Clinical Impression(s) / ED Diagnoses Final diagnoses:  Tail bone pain    Rx / DC Orders ED Discharge Orders         Ordered    Discharge patient     11/10/19 1124    cephALEXin (KEFLEX) 500 MG capsule  2 times daily     11/10/19 1521           Discharge Instructions     Your labwork was reassuring today. You do not appear to have any infection consistent with a pilonidal cyst today however given history in the past I have prescribed an antibiotic to cover incase it does become infected. Please continue monitoring area. If the area turns red and begins to swell please pick up prescription and take accordingly.   Continue taking Tylenol as needed for pain.   Follow up with your PCP. If you do not have a PCP you can follow up with [facility] for your primary care needs. If no improvement with the antibiotics please return to the ED for further evaluation.        Eustaquio Maize, PA-C 11/10/19 1530    Charlesetta Shanks, MD 11/11/19 [phone]

## 2019-11-10 NOTE — Discharge Instructions (Signed)
Your labwork was reassuring today. You do not appear to have any infection consistent with a pilonidal cyst today however given history in the past I have prescribed an antibiotic to cover incase it does become infected. Please continue monitoring area. If the area turns red and begins to swell please pick up prescription and take accordingly.   Continue taking Tylenol as needed for pain.   Follow up with your PCP. If you do not have a PCP you can follow up with [facility] for your primary care needs. If no improvement with the antibiotics please return to the ED for further evaluation.

## 2019-11-10 NOTE — MAU Provider Note (Signed)
First Provider Initiated Contact with Patient 11/10/19 1109      S [name] is a 24 y.o. G1P0 pregnant female at [redacted]w[redacted]d gestation who presents to MAU today with complaint of noticing some skin changes over her tailbone 2 wks ago. She reports the skin is looking worse now and she is having sharp pain in that area. She states it hurts to sit down. She thinks she is getting another pilonidal cyst above her tailbone. She has a h/o infected pilonidal cysts, surgery x 2 for them.   O BP 115/67 (BP Location: Right Arm)   Pulse 89   Temp 98.4 F (36.9 C) (Oral)   Resp 16   Ht 5\' 3"  (1.6 m)   Wt 90.7 kg   LMP 04/19/2019   SpO2 100% Comment: room air  BMI 35.41 kg/m  Physical Exam  Nursing note and vitals reviewed. Constitutional: She is oriented to person, place, and time. She appears well-developed and well-nourished.  HENT:  Head: Normocephalic and atraumatic.  Eyes: Pupils are equal, round, and reactive to light.  Cardiovascular: Normal rate.  Respiratory: Effort normal.  GI: Soft.  Genitourinary:    Genitourinary Comments: Pelvic deferred   Musculoskeletal:        General: Normal range of motion.     Cervical back: Normal range of motion.  Neurological: She is alert and oriented to person, place, and time.  Psychiatric: She has a normal mood and affect. Her behavior is normal. Judgment and thought content normal.   FHTs by doppler: 147 bpm   A Pregnant female G1P0 at [redacted]w[redacted]d gestation Medical screening exam complete Tail bone pain - possible pilonidal cyst - Plan: Discharge patient   P Discharge from MAU in stable condition Patient given the option of transfer to [facility] for further evaluation or seek care in outpatient facility of choice Patient chose to be transferred to [facility] [address] [facility] notified of patient being cleared for pregnancy and that patient wants pilonidal cyst evaluated by [facility] -- Ok to transfer patient Patient may return to MAU as needed for pregnancy  related complaints  Lynelle Doctor, CNM 11/10/2019 11:09 AM

## 2019-11-10 NOTE — MAU Note (Signed)
Report given to Jamie in [facility]. Transport notified. 

## 2019-11-10 NOTE — MAU Note (Signed)
Sharon Ward is a 24 y.o. at [redacted]w[redacted]d here in MAU reporting: has hx of pilonidal cyst, has been operated on for this a couple of times and it has gotten infected. Noticed a cyst on her tailbone 2 weeks ago. States she has noticed some changes and it is looking worse and having some sharp pain in the area. +FM  Onset of complaint: 2 weeks  Pain score: 5/10  Vitals:   11/10/19 1104  BP: 115/67  Pulse: 89  Resp: 16  Temp: 98.4 F (36.9 C)  SpO2: 100%     FHT: 147  Lab orders placed from triage: none

## 2019-11-10 NOTE — ED Triage Notes (Addendum)
Pt arrives from home with complaints of pilonidal cyst. Has had 2 surgeries several years ago for same. Noticed this week that area seems to be more "soft" and tender. Pt is currently [redacted] weeks pregnant.

## 2019-11-13 ENCOUNTER — Encounter: Payer: Self-pay | Admitting: Advanced Practice Midwife

## 2019-11-13 ENCOUNTER — Telehealth (INDEPENDENT_AMBULATORY_CARE_PROVIDER_SITE_OTHER): Payer: Medicaid Other | Admitting: Advanced Practice Midwife

## 2019-11-13 DIAGNOSIS — O2603 Excessive weight gain in pregnancy, third trimester: Secondary | ICD-10-CM | POA: Diagnosis not present

## 2019-11-13 DIAGNOSIS — Z3A29 29 weeks gestation of pregnancy: Secondary | ICD-10-CM | POA: Diagnosis not present

## 2019-11-13 DIAGNOSIS — O98813 Other maternal infectious and parasitic diseases complicating pregnancy, third trimester: Secondary | ICD-10-CM | POA: Diagnosis not present

## 2019-11-13 DIAGNOSIS — O99212 Obesity complicating pregnancy, second trimester: Secondary | ICD-10-CM

## 2019-11-13 DIAGNOSIS — B951 Streptococcus, group B, as the cause of diseases classified elsewhere: Secondary | ICD-10-CM | POA: Diagnosis not present

## 2019-11-13 DIAGNOSIS — O0990 Supervision of high risk pregnancy, unspecified, unspecified trimester: Secondary | ICD-10-CM

## 2019-11-13 MED ORDER — TERCONAZOLE 0.4 % VA CREA: 1.0000 | TOPICAL_CREAM | Freq: Every day | VAGINAL | 0 refills | Status: AC

## 2019-11-13 NOTE — Progress Notes (Signed)
   TELEHEALTH VIRTUAL OBSTETRICS VISIT ENCOUNTER NOTE  I connected with [name] on 11/13/19 at  8:15 AM EDT by telephone at home and verified that I am speaking with the correct person using two identifiers.   I discussed the limitations, risks, security and privacy concerns of performing an evaluation and management service by telephone and the availability of in person appointments. I also discussed with the patient that there may be a patient responsible charge related to this service. The patient expressed understanding and agreed to proceed.  Subjective:  Sharon Ward is a 24 y.o. G1P0 at [redacted]w[redacted]d being followed for ongoing prenatal care.  She is currently monitored for the following issues for this low-risk pregnancy and has Anxiety; Asthma; Bipolar disorder (HCC); Depression; LGSIL on Pap smear of cervix; Oppositional defiant disorder; H/O psychiatric hospitalization; Seasonal allergies; Supervision of high risk pregnancy, antepartum; Vaginal bleeding during pregnancy; Obesity affecting pregnancy; Late prenatal care affecting pregnancy in second trimester; Group beta Strep positive; and Excess weight gain in pregnancy on their problem list.  Patient reports vaginal irritation and thinks she may have a yeast infection  mainly itching after sex, and patient has a history of frequent yeast infections. Reports fetal movement. Denies any contractions, bleeding or leaking of fluid.   The following portions of the patient's history were reviewed and updated as appropriate: allergies, current medications, past family history, past medical history, past social history, past surgical history and problem list.   Objective:   General:  Alert, oriented and cooperative.   Mental Status: Normal mood and affect perceived. Normal judgment and thought content.  Rest of physical exam deferred due to type of encounter  Assessment and Plan:  Pregnancy: G1P0 at [redacted]w[redacted]d 1. Supervision of high risk  pregnancy, antepartum - routine care  - rx terazol 7 to treat for yeast. Will have patient come for in person visit in 2 weeks to check is yeast infection has resolved or test.   2. Excessive weight gain during pregnancy in third trimester   3. Obesity affecting pregnancy in second trimester   4. Group beta Strep positive   Preterm labor symptoms and general obstetric precautions including but not limited to vaginal bleeding, contractions, leaking of fluid and fetal movement were reviewed in detail with the patient.  I discussed the assessment and treatment plan with the patient. The patient was provided an opportunity to ask questions and all were answered. The patient agreed with the plan and demonstrated an understanding of the instructions. The patient was advised to call back or seek an in-person office evaluation/go to [facility] at [facility] for any urgent or concerning symptoms. Please refer to After Visit Summary for other counseling recommendations.   I provided 30 minutes of non-face-to-face time during this encounter.  Return in about 2 weeks (around 11/27/2019) for in person visit .  Future Appointments  Date Time Provider Department Center  11/15/2019 11:00 AM Sheets, Ivin Booty, LCSW [facility] None  11/28/2019  9:00 AM [name], MD [facility] None  12/10/2019  2:30 PM [facility] NURSE [facility]  12/10/2019  2:30 PM [facility] [place] 1 [facility]    Thressa Sheller DNP, CNM  11/13/19  8:37 AM
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
## 2019-11-13 NOTE — Progress Notes (Signed)
I connected with  Sharon Ward on 11/13/19 at  8:15 AM EDT by telephone and verified that I am speaking with the correct person using two identifiers.   I discussed the limitations, risks, security and privacy concerns of performing an evaluation and management service by telephone and the availability of in person appointments. I also discussed with the patient that there may be a patient responsible charge related to this service. The patient expressed understanding and agreed to proceed.  Henrietta Dine, CMA 11/13/2019  8:23 AM

## 2019-11-15 ENCOUNTER — Ambulatory Visit (INDEPENDENT_AMBULATORY_CARE_PROVIDER_SITE_OTHER): Payer: No Typology Code available for payment source | Admitting: Licensed Clinical Social Worker

## 2019-11-15 DIAGNOSIS — F3160 Bipolar disorder, current episode mixed, unspecified: Secondary | ICD-10-CM

## 2019-11-15 DIAGNOSIS — F411 Generalized anxiety disorder: Secondary | ICD-10-CM
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
## 2019-11-20 ENCOUNTER — Telehealth: Payer: Self-pay | Admitting: Family Medicine

## 2019-11-20 NOTE — Telephone Encounter (Signed)
LVM Advising to call back for advice on her earlier request.

## 2019-11-20 NOTE — Telephone Encounter (Signed)
Patient would like a call back stating she is having blood come from her ears when she clean them with a Q-Tip, she said its never happed before and want to know what she need to do.

## 2019-11-22 NOTE — BH Specialist Note (Signed)
Integrated Behavioral Health via Telemedicine Video Visit  11/22/2019 Sharon Ward [id number]  Number of Integrated Behavioral Health visits: 2 Session Start time: 10:13  Session End time: 10:53 Total time: 40   Referring Provider: Jaynie Collins, MD Type of Visit: Video Sharon/Family location: Home [facility] Provider location: [facility] All persons participating in visit: Sharon Ward and [facility] Zaven Klemens    Confirmed Sharon's address: Yes  Confirmed Sharon's phone number: Yes  Any changes to demographics: No   Confirmed Sharon's insurance: Yes  Any changes to Sharon's insurance: No   Discussed confidentiality: at previous visit  I connected with [name]  by a video enabled telemedicine application and verified that I am speaking with the correct person using two identifiers.     I discussed the limitations of evaluation and management by telemedicine and the availability of in person appointments.  I discussed that the purpose of this visit is to provide behavioral health care while limiting exposure to the novel coronavirus.   Discussed there is a possibility of technology failure and discussed alternative modes of communication if that failure occurs.  I discussed that engaging in this video visit, they consent to the provision of behavioral healthcare and the services will be billed under their insurance.  Sharon and/or legal guardian expressed understanding and consented to video visit: Yes   PRESENTING CONCERNS: Sharon and/or family reports the following symptoms/concerns: Pt states her primary concerns today are body image issue with weight gain in pregnancy, uncertainty over taking BH medications postpartum with breastfeeding, physical discomfort during pregnancy affecting sleep, lack of family support, and  housing and transportation issues. 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  Duration of problem: Ongoing; Severity of problem: moderate  STRENGTHS (Protective Factors/Coping Skills): Supportive father and FOB  GOALS ADDRESSED: Sharon will: 1.  Reduce symptoms of: anxiety, depression and stress  2.  Increase knowledge and/or ability of: healthy habits and stress reduction  3.  Demonstrate ability to: Increase healthy adjustment to current life circumstances and Increase adequate support systems for Sharon/family  INTERVENTIONS: Interventions utilized:  Supportive Counseling, Sleep Hygiene, Psychoeducation and/or Health Education and Link to [facility] Standardized Assessments completed: Not given today  ASSESSMENT: Sharon currently experiencing Bipolar 1 disorder, most recent episode mixed and Generalized anxiety disorder (as previously diagnosed) and Psychosocial stress  Sharon may benefit from psychoeducation and brief therapeutic interventions regarding coping with symptoms of depression, anxiety, and life stress .  PLAN: 1. Follow up with behavioral health clinician on : One month 2. Behavioral recommendations:  -Continue taking BH medication as prescribed by psychiatrist -Continue plan to take online waterbirth class on 12/12/19 -At medical appointment on 12/03/19, talk to provider about having a waterbirth and about obtaining maternity belt  -Consider housing and transportation resources, as needed (on After Visit Summary) -Read through Postpartum Planner prior to follow up visit with [facility] (on After Visit Summary) -Be aware of therapists specializing in eating disorders, as needed in the future (on After Visit Summary)  3. Referral(s): Integrated Art gallery manager (In Clinic) and [facility]:  Housing and Transportation  I discussed the assessment and treatment plan with the Sharon and/or parent/guardian. They were provided an opportunity to ask questions and all were answered. They agreed with the  plan and demonstrated an understanding of the instructions.   They were advised to call back or seek an in-person evaluation if the symptoms worsen or if the condition  fails to improve as anticipated.  Caroleen Hamman Zayn Selley

## 2019-11-27 ENCOUNTER — Other Ambulatory Visit: Payer: Self-pay

## 2019-11-27 ENCOUNTER — Ambulatory Visit (INDEPENDENT_AMBULATORY_CARE_PROVIDER_SITE_OTHER): Payer: No Typology Code available for payment source | Admitting: Clinical

## 2019-11-27 DIAGNOSIS — F3160 Bipolar disorder, current episode mixed, unspecified: Secondary | ICD-10-CM | POA: Diagnosis not present

## 2019-11-27 DIAGNOSIS — O99343 Other mental disorders complicating pregnancy, third trimester: Secondary | ICD-10-CM | POA: Diagnosis not present

## 2019-11-27 DIAGNOSIS — F411 Generalized anxiety disorder: Secondary | ICD-10-CM | POA: Diagnosis not present

## 2019-11-27 DIAGNOSIS — Z3A31 31 weeks gestation of pregnancy: Secondary | ICD-10-CM

## 2019-11-27 DIAGNOSIS — Z658 Other specified problems related to psychosocial circumstances: Secondary | ICD-10-CM

## 2019-11-27 NOTE — Patient Instructions (Addendum)
Sharon Ward, Included below are the following: Postpartum Insurance account manager, [employer], [employer], and Therapists specializing in eating disorders.     BRAINSTORMING  Develop a Plan Goals: . Provide a way to start conversation about your new life with a baby . Assist parents in recognizing and using resources within their reach . Help pave the way before birth for an easier period of transition afterwards.  Make a list of the following information to keep in a central location: . Full name of Mom and Partner: _____________________________________________ . 5 full name and Date of Birth: ___________________________________________ . Home Address: ___________________________________________________________ ________________________________________________________________________ . Home Phone: ____________________________________________________________ . Parents' cell numbers: _____________________________________________________ ________________________________________________________________________ . Name and contact info for OB: ______________________________________________ . Name and contact info for Pediatrician:________________________________________ . Contact info for Lactation Consultants: ________________________________________  REST and SLEEP *You each need at least 4-5 hours of uninterrupted sleep every day. Write specific names and contact information.* . How are you going to rest in the postpartum period? While partner's home? When partner returns to work? When you both return to work? Marland Kitchen Where will your baby sleep? Marland Kitchen Who is available to help during the day? Evening? Night? . Who could move in for a period to help support you? Marland Kitchen What are some ideas to help you get enough  sleep? __________________________________________________________________________________________________________________________________________________________________________________________________________________________________________ NUTRITIOUS FOOD AND DRINK *Plan for meals before your baby is born so you can have healthy food to eat during the immediate postpartum period.* . Who will look after breakfast? Lunch? Dinner? List names and contact information. Brainstorm quick, healthy ideas for each meal. . What can you do before baby is born to prepare meals for the postpartum period? . How can others help you with meals? Marland Kitchen Which grocery stores provide online shopping and delivery? Marland Kitchen Which restaurants offer take-out or delivery options? ______________________________________________________________________________________________________________________________________________________________________________________________________________________________________________________________________________________________________________________________________________________________________________________________________  CARE FOR MOM *It's important that mom is cared for and pampered in the postpartum period. Remember, the most important ways new mothers need care are: sleep, nutrition, gentle exercise, and time off.* . Who can come take care of mom during this period? Make a list of people with their contact information. . List some activities that make you feel cared for, rested, and energized? Who can make sure you have opportunities to do these things? . Does mom have a space of her very own within your home that's just for her? Make a "[facility]" where she can be comfortable, rest, and renew herself  daily. ______________________________________________________________________________________________________________________________________________________________________________________________________________________________________________________________________________________________________________________________________________________________________________________________________    CARE FOR AND FEEDING BABY *Knowledgeable and encouraging people will offer the best support with regard to feeding your baby.* . Educate yourself and choose the best feeding option for your baby. . Make a list of people who will guide, support, and be a resource for you as your care for and feed your baby. (Friends that have breastfed or are currently breastfeeding, lactation consultants, breastfeeding support groups, etc.) . Consider a postpartum doula. (These websites can give you information: dona.org & BuyingShow.es) . Seek out local breastfeeding resources like the breastfeeding support group at [employer] or [employer]. ______________________________________________________________________________________________________________________________________________________________________________________________________________________________________________________________________________________________________________________________________________________________________________________________________  [name] AND ERRANDS . Who can help with a thorough cleaning before baby is born? . Make a list of people who will help with housekeeping and chores, like laundry, light cleaning, dishes, bathrooms, etc. . Who can run some errands for you? Marland Kitchen What can you do to make sure you are stocked with basic supplies before baby is born? . Who is going to do the  shopping? ______________________________________________________________________________________________________________________________________________________________________________________________________________________________________________________________________________________________________________________________________________________________________________________________________  Family Adjustment *Nurture yourselves.it helps parents be more loving and allows for better bonding with their child.* . What sorts of things do you and partner enjoy doing together? Which activities help you to connect and strengthen your relationship? Make a list of those things. Make a list of people whom you trust to care for your baby so you can have some time together as a couple. . What types of things help partner feel connected to Mom? Make a list. . What needs will partner have in order to bond with baby? . Other children? Who will care for them when you go into labor and while you are in the hospital? . Think about what the needs of your older children might be. Who can help you meet those needs? In what ways are you helping them prepare for bringing baby home? List some specific strategies you have for family adjustment. _______________________________________________________________________________________________________________________________________________________________________________________________________________________________________________________________________________________________________________________________________________  SUPPORT *Someone who can empathize with experiences normalizes your problems and makes them more bearable.* . Make a list of other friends, neighbors, and/or co-workers you know with infants (and small children, if applicable) with whom you can connect. . Make a list of local or online support groups, mom groups, etc. in which you can be  involved. ______________________________________________________________________________________________________________________________________________________________________________________________________________________________________________________________________________________________________________________________________________________________________________________________________  Childcare Plans . Investigate and plan for childcare if mom is returning to work. . Talk about mom's concerns about her transition back to work. . Talk about partner's concerns regarding this transition.  Mental Health *Your mental health is one of the highest priorities for a pregnant or postpartum mom.* . 1 in 5 women experience anxiety and/or depression from the time of conception through the first year after birth. . Postpartum Mood Disorders are the #1 complication of pregnancy and childbirth and the suffering experienced by these mothers is not necessary! These illnesses are temporary and respond well to treatment, which often includes self-care, social support, talk therapy, and medication when needed. . Women experiencing anxiety and depression often say things like: "I'm supposed to be happy.why do I feel so sad?", "Why can't I snap out of it?", "I'm having thoughts that scare me." . There is no need to be embarrassed if you are feeling these symptoms: o Overwhelmed, anxious, angry, sad, guilty, irritable, hopeless, exhausted but can't sleep o You are NOT alone. You are NOT to blame. With help, you WILL be well. . Where can I find help? Medical professionals such as your OB, midwife, gynecologist, family practitioner, primary care provider, pediatrician, or mental health providers; [facility] support groups: Feelings After Birth, Breastfeeding Support Group, Baby and Me Group, and Fit 4 Two exercise classes. . You have permission to ask for help. It will confirm your feelings, validate your  experiences, share/learn coping strategies, and gain support and encouragement as you heal. You are important! BRAINSTORM . Make a list of local resources, including resources for mom and for partner. . Identify support groups. . Identify people to call late at night - include names and contact info. . Talk with partner about perinatal mood and anxiety disorders. . Talk with your OB, midwife, and doula about baby blues and about perinatal mood and anxiety disorders. . Talk with your pediatrician about perinatal mood and anxiety disorders.   Support & Sanity Savers   What do you really need?  . Basics . In preparing for a new baby, many expectant parents spend hours shopping for baby clothes, decorating the nursery, and deciding which car seat to buy. Yet most don't think much  about what the reality of parenting a newborn will be like, and what they need to make it through that. So, here is the advice of experienced parents. We know you'll read this, and think "they're exaggerating, I don't really need that." Just trust [place] on these, OK? Plan for all of this, and if it turns out you don't need it, come back and teach [place] how you did it!  Satira Anis (Once baby's survival needs are met, make sure you attend to your own survival needs!) . Sleep . An average newborn sleeps 16-18 hours per day, over 6-7 sleep periods, rarely more than three hours at a time. It is normal and healthy for a newborn to wake throughout the night... but really hard on parents!! . Naps. Prioritize sleep above any responsibilities like: cleaning house, visiting friends, running errands, etc.  Sleep whenever baby sleeps. If you can't nap, at least have restful times when baby eats. The more rest you get, the more patient you will be, the more emotionally stable, and better at solving problems.  . Food . You may not have realized it would be difficult to eat when you have a newborn. Yet, when we talk to . countless new  parents, they say things like "it may be 2:00 pm when I realize I haven't had breakfast yet." Or "every time we sit down to dinner, baby needs to eat, and my food gets cold, so I don't bother to eat it." . Finger food. Before your baby is born, stock up with one months' worth of food that: 1) you can eat with one hand while holding a baby, 2) doesn't need to be prepped, 3) is good hot or cold, 4) doesn't spoil when left out for a few hours, and 5) you like to eat. Think about: nuts, dried fruit, Clif bars, pretzels, jerky, gogurt, baby carrots, apples, bananas, crackers, cheez-n-crackers, string cheese, hot pockets or frozen burritos to microwave, garden burgers and breakfast pastries to put in the toaster, yogurt drinks, etc. . Restaurant Menus. Make lists of your favorite restaurants & menu items. When family/friends want to help, you can give specific information without much thought. They can either bring you the food or send gift cards for just the right meals. Sharon Ward Meals.  Take some time to make a few meals to put in the freezer ahead of time.  Easy to freeze meals can be anything such as soup, lasagna, chicken pie, or spaghetti sauce. . Set up a Meal Schedule.  Ask friends and family to sign up to bring you meals during the first few weeks of being home. (It can be passed around at baby showers!) You have no idea how helpful this will be until you are in the throes of parenting.  https://hamilton-woodard.[email or web address] is a great website to check out. . Emotional Support . Know who to call when you're stressed out. Parenting a newborn is very challenging work. There are times when it totally overwhelms your normal coping abilities. EVERY NEW PARENT NEEDS TO HAVE A PLAN FOR WHO TO CALL WHEN THEY JUST CAN'T COPE ANY MORE. (And it has to be someone other than the baby's other parent!) Before your baby is born, come up with at least one person you can call for support - write their phone number down and post it on the  refrigerator. Marland Kitchen Anxiety & Sadness. Baby blues are normal after pregnancy; however, there are more severe types of anxiety & sadness which can occur and  should not be ignored.  They are always treatable, but you have to take the first step by reaching out for help. [facility] offers a "Mom Talk" group which meets every Tuesday from 10 am - 11 am.  This group is for new moms who need support and connection after their babies are born.  Call [phone].  Marland Kitchen Really, Really Helpful (Plan for them! Make sure these happen often!!) . Physical Support with Taking Care of Yourselves . Asking friends and family. Before your baby is born, set up a schedule of people who can come and visit and help out (or ask a friend to schedule for you). Any time someone says "let me know what I can do to help," sign them up for a day. When they get there, their job is not to take care of the baby (that's your job and your joy). Their job is to take care of you!  . Postpartum doulas. If you don't have anyone you can call on for support, look into postpartum doulas:  professionals at helping parents with caring for baby, caring for themselves, getting breastfeeding started, and helping with household tasks. www.padanc.org is a helpful website for learning about doulas in our area. . Peer Support / Parent Groups . Why: One of the greatest ideas for new parents is to be around other new parents. Parent groups give you a chance to share and listen to others who are going through the same season of life, get a sense of what is normal infant development by watching several babies learn and grow, share your stories of triumph and struggles with empathetic ears, and forgive your own mistakes when you realize all parents are learning by trial and error. . Where to find: There are many places you can meet other new parents throughout our community.  [facility] offers the following classes for new moms and their little ones:  Baby  and Me (Birth to [place]) and Breastfeeding Support Group. Go to www.conehealthybaby.com or call [phone] for more information. . Time for your Relationship . It's easy to get so caught up in meeting baby's immediate needs that it's hard to find time to connect with your partner, and meet the needs of your relationship. It's also easy to forget what "quality time with your partner" actually looks like. If you take your baby on a date, you'd be amazed how much of your couple time is spent feeding the baby, diapering the baby, admiring the baby, and talking about the baby. . Dating: Try to take time for just the two of you. Babysitter tip: Sometimes when moms are breastfeeding a newborn, they find it hard to figure out how to schedule outings around baby's unpredictable feeding schedules. Have the babysitter come for a three hour period. When she comes over, if baby has just eaten, you can leave right away, and come back in two hours. If baby hasn't fed recently, you start the date at home. Once baby gets hungry and gets a good feeding in, you can head out for the rest of your date time. . Date Nights at Home: If you can't get out, at least set aside one evening a week to prioritize your relationship: whenever baby dozes off or doesn't have any immediate needs, spend a little time focusing on each other. . Potential conflicts: The main relationship conflicts that come up for new parents are: issues related to sexuality, financial stresses, a feeling of an unfair division of household tasks, and conflicts  in parenting styles. The more you can work on these issues before baby arrives, the better!  Clint Guy and Frills (Don't forget these. and don't feel guilty for indulging in them!) . Everyone has something in life that is a fun little treat that they do just for themselves. It may be: reading the morning paper, or going for a daily jog, or having coffee with a friend once a week, or going to a movie on Friday  nights, or fine chocolates, or bubble baths, or curling up with a good book. . Unless you do fun things for yourself every now and then, it's hard to have the energy for fun with your baby. Whatever your "special" treats are, make sure you find a way to continue to indulge in them after your baby is born. These special moments can recharge you, and allow you to return to baby with a new joy   PERINATAL MOOD DISORDERS: [place]   Emergency and Crisis Resources:  If you are an imminent risk to self or others, are experiencing intense personal distress, and/or have noticed significant changes in activities of daily living, call:  . 911 . [facility]: [phone] . Mobile Crisis: [phone] . National Suicide Hotline: 680 454 8867 Or visit the following crisis centers: . Local Emergency Departments . [facility]: [address], [place]. Hours: 8:30AM-5PM. Insurance Accepted: Medicaid, Medicare, and Uninsured.  Marland Kitchen [facility]  [address] [place] Mon-Friday 8am-3pm  [phone]                                                                                    Non-Crisis Resources: To identify specific providers that are covered by your insurance, contact your insurance company or local agencies: [place]: [phone] [facility] and [place]: [phone] [place]: [phone] Postpartum Support International- [place] 1-([phone]) [phone]                                                      Outpatient therapy and medication management providers:  [facility] [phone] Hours: 9AM-5PM  Insurance Accepted: Alben Spittle, Lorella Nimrod, Freddrick March, [place], Medicare  [facility] Total [facility] ([place]) [phone] Hours: 8AM-5PM  nsurance Accepted: All insurances EXCEPT AARP, [place], [place], and  [place]: [phone]             Hours: 8AM-8PM Insurance Accepted: Cristal Ford, Freddrick March, [place], Medicare, [phone] [facility]: [phone] Hours: 8:30AM-7PM Insurance Accepted: Cristal Ford, Medicaid, Medicare, Tricare, [employer] Counseling:  [place] Accepted:  Holland Falling, Lorella Nimrod, [employer], [place], [facility] [phone] Hours: 9AM-5:30PM Insurance Accepted: Alben Spittle, [place] Crumb, and Medicaid, Medicare, [employer] [facility] Counseling:  [phone] Hours: 9am-5pm Insurance Accepted: BCBS; they do not accept Medicaid/Medicare [facility] [place]: [phone] Hours:  9am-9pm Insurance Accepted: All major insurance including Medicaid and Medicare [facility]: [phone] Hours: 9AM-5:30PM Insurance Accepted: All insurances EXCEPT Medicaid and Medicare. [facility]: [place]: [phone] [place]:  [place] (support for children in the NICU and/or with special needs), [place] [employer]: [phone]                                                                                     Online Resources: Postpartum Support International: http://jones-berg.[email or web address]/  800-944-4PPD 2Moms Supporting Moms:  www.momssupportingmoms.net  ----------------------------------------------------------------------------------------------------------------------------------------------------------------------------------                                                                    Housing Resources                    [employer] (serves [place], [place], [place], [place], [place], [place], [place], [place], [place], [place], [place], [place], and [place] counties) [address], [place], [place] [address] [phone] http://[email or web address]  **Rental assistance, Home Rehabilitation,Weatherization Assistance Program, Forensic psychologist, Housing Voucher Program   [facility] [place] [address], [place], [place] [address] [phone] www.gha-[place].[facility] [address] Lin [place], [place] [address] [phone] https://[email or web address] **Programs include: Engineer, building services and Housing Counseling, Healthy Doctor, general practice, Homeless Prevention and [place] [address], [place], [place], [place] [address] [phone] www.https://www.farmer-stevens.info/ **housing applications/recertification; tax payment relief/exemption under specific qualifications  [facility] [address] [place], [place] [address] www.[email or web address]/~maryshouse **transitional housing for women in recovery who have minor children or are pregnant  [place], [place], [place] [address] ArtistMovie.se  **emergency shelter and support services for families facing homelessness  [place] [address], [place],  [address] [phone] www.youthfocus.org **transitional housing to pregnant women; emergency housing for youth who have run away, are experiencing a family crisis, are victims of abuse or neglect, or are homeless  [place] [address],  [place], [place] [address] [phone] ircgso.org **Drop-in center for people experiencing homelessness; overnight warming center when temperature is 25 degrees or  below  [employer] [place], [employer], [place] [address] [phone] https://reentrystaffingagency.org/ **help with affordable housing to people experiencing homelessness or unemployment due to incarceration  [facility] [address], [place], [place] [address] [phone] www.greensborourbanministry.org  **emergency and transitional housing, rent/mortgage assistance, utility assistance  [facility] [address], [place], [place] [address] [phone] www.salvationarmyof[place].org **emergency and transitional housing  [employer] for [employer] [place], [place], [place] [address] [phone] Www.habitatgreensboro.[place], [place], [place] [address] [phone] https://[email or web address] **[place] and [facility]  [facility] [address] [place] [address], [place], [place] [address] [phone] arrivance.com **home buyer education courses, foreclosure prevention  [place], [place], [place] [address] [phone] WirelessNovelties.no **Environmental Exposure Assessment (investigation of homes where either children or pregnant women with a confirmed elevated blood lead level reside)  [facility] [address] [place], [place] [address] [phone] http://www.perez.[email or web address]/ **Home Expense Assistance/Repairs Program; offers home accessibility updates, such as ramps or bars in the bathroom  [facility] [address] [place], [place] [address] [phone] https://www.self-help.org/locations/[place]-branch **Offers credit-building and banking services to people unable to use traditional  banking  --------------------------------------------------------------------------------------------------------------------------------------------------------------------------------  Transportation Resources [place], [place], [place] [address] [email or web address]-[email or web address]-[email or web address]-[email or web address]-division     . Fixed-route bus services, including regional fare cards for PART, [place], [place], and [facility] buses.  . Reduced fare bus ID's available for Medicaid, Medicare, and "orange card" recipients.  [facility] offers curb-to-curb and door-to-door bus services for people with disabilities who are unable to use a fixed-bus route; also offers a shared-ride program.   Helpful tips:  -Routes available online and physical maps available at the main bus hub lobby (each for a specific route) -Smartphone directions often include bus routes (see the "bus" icon, next to the "car" and "walk" icons) -Routes differ on weekends, evenings and holidays, so plan ahead!  -If you have Medicaid, Medicare, or orange card, plan to obtain a reduced-fare ID to save 50% on rides. Check days and times to obtain an ID, and bring all necessary documents.   [employer]) [place], [place]. [address], [place], [place] [address] [phone] [email or web address] **Fixed-bus route services, and demand response bus service for older adults  [facility] [address] [place], [place] [address] [phone] [email or web address].fi **Medicaid transportation is available to [facility] recipients who need assistance getting to [facility] medical appointments and providers  [facility] [address] [place] [address],  [place], [place] [address] [email or web address]  ** Offers non-emergency transportation for medical appointments  [employer] [place], [place], [place] [address] [phone] [email or web address] **REFERRAL NEEDED by specific agencies (see website), after meeting specified criteria only  [employer]) [address], [place], [place] [address] [phone]  BikerFestival.is  *Regional fixed-bus routes between counties (example:  to [place]) and [employer]  -----------------------------------------------------------------------------------------------------------------------------------------------------------------------------                                             Surveyor, mining  in eating disorders  Local Therapists [place], MSW, LCSW [facility]              [phone] [address], [place]    [place], [place], [place]      [phone]   Eating Disorder Clinics/Residential Treatment [facility] out application on line or Photographer office [phone] [email or web address] Residential program for young women dealing with life-controlling issues, generally a 6 month program. Locations in [place], [place]; [place], [place]; [place], [place]; [place], [place] and one underway in [place], [place] Locations in [place] and [place] Residential program specializing in the treatment of eating disorders and [place], [place] or [phone]

## 2019-11-28 ENCOUNTER — Ambulatory Visit (HOSPITAL_COMMUNITY): Payer: No Typology Code available for payment source | Admitting: Psychiatry

## 2019-11-29 ENCOUNTER — Telehealth (INDEPENDENT_AMBULATORY_CARE_PROVIDER_SITE_OTHER): Payer: No Typology Code available for payment source | Admitting: Psychiatry

## 2019-11-29 ENCOUNTER — Encounter (HOSPITAL_COMMUNITY): Payer: Self-pay | Admitting: Psychiatry

## 2019-11-29 DIAGNOSIS — F411 Generalized anxiety disorder: Secondary | ICD-10-CM

## 2019-11-29 DIAGNOSIS — Z8659 Personal history of other mental and behavioral disorders: Secondary | ICD-10-CM | POA: Diagnosis not present

## 2019-11-29 DIAGNOSIS — F3160 Bipolar disorder, current episode mixed, unspecified: Secondary | ICD-10-CM | POA: Diagnosis not present

## 2019-11-29 DIAGNOSIS — O0990 Supervision of high risk pregnancy, unspecified, unspecified trimester: Secondary | ICD-10-CM | POA: Diagnosis not present

## 2019-11-29 MED ORDER — QUETIAPINE FUMARATE 100 MG PO TABS: 100.0000 mg | ORAL_TABLET | Freq: Every day | ORAL | 0 refills | Status: DC

## 2019-11-29 MED ORDER — SERTRALINE HCL 50 MG PO TABS: 75.0000 mg | ORAL_TABLET | Freq: Every day | ORAL | 0 refills | Status: DC
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
## 2019-12-03 ENCOUNTER — Other Ambulatory Visit: Payer: Self-pay

## 2019-12-03 ENCOUNTER — Ambulatory Visit (INDEPENDENT_AMBULATORY_CARE_PROVIDER_SITE_OTHER): Payer: Medicaid Other | Admitting: Family Medicine

## 2019-12-03 VITALS — BP 125/69 | HR 78 | Wt 210.0 lb

## 2019-12-03 DIAGNOSIS — O26893 Other specified pregnancy related conditions, third trimester: Secondary | ICD-10-CM

## 2019-12-03 DIAGNOSIS — O0990 Supervision of high risk pregnancy, unspecified, unspecified trimester: Secondary | ICD-10-CM

## 2019-12-03 DIAGNOSIS — O99343 Other mental disorders complicating pregnancy, third trimester: Secondary | ICD-10-CM

## 2019-12-03 DIAGNOSIS — F411 Generalized anxiety disorder: Secondary | ICD-10-CM

## 2019-12-03 DIAGNOSIS — O99513 Diseases of the respiratory system complicating pregnancy, third trimester: Secondary | ICD-10-CM

## 2019-12-03 DIAGNOSIS — Z3A32 32 weeks gestation of pregnancy: Secondary | ICD-10-CM

## 2019-12-03 DIAGNOSIS — J45909 Unspecified asthma, uncomplicated: Secondary | ICD-10-CM

## 2019-12-03 DIAGNOSIS — R102 Pelvic and perineal pain: Secondary | ICD-10-CM

## 2019-12-03 DIAGNOSIS — R87612 Low grade squamous intraepithelial lesion on cytologic smear of cervix (LGSIL): Secondary | ICD-10-CM

## 2019-12-03 DIAGNOSIS — F3160 Bipolar disorder, current episode mixed, unspecified: Secondary | ICD-10-CM

## 2019-12-03 MED ORDER — COMFORT FIT MATERNITY SUPP MED MISC: 1.0000 [IU] | 0 refills | Status: DC | PRN

## 2019-12-03 MED ORDER — COMFORT FIT MATERNITY SUPP SM MISC: 1.0000 [IU] | Freq: Every day | 0 refills | Status: DC | PRN

## 2019-12-03 NOTE — Progress Notes (Signed)
   PRENATAL VISIT NOTE  Subjective:  Sharon Ward is a 24 y.o. G1P0 at [redacted]w[redacted]d being seen today for ongoing prenatal care.  She is currently monitored for the following issues for this high-risk pregnancy and has Anxiety; Asthma; Bipolar disorder (HCC); Depression; LGSIL on Pap smear of cervix; Oppositional defiant disorder; H/O psychiatric hospitalization; Seasonal allergies; Supervision of high risk pregnancy, antepartum; Vaginal bleeding during pregnancy; Obesity affecting pregnancy; Late prenatal care affecting pregnancy in second trimester; Group beta Strep positive; and Excess weight gain in pregnancy on their problem list.  Patient reports no complaints.  Contractions: Irregular. Vag. Bleeding: None.  Movement: Present. Denies leaking of fluid.   The following portions of the patient's history were reviewed and updated as appropriate: allergies, current medications, past family history, past medical history, past social history, past surgical history and problem list.   Objective:   Vitals:   12/03/19 1638  BP: 125/69  Pulse: 78  Weight: 210 lb (95.3 kg)    Fetal Status: Fetal Heart Rate (bpm): 138 Fundal Height: 34 cm Movement: Present     General:  Alert, oriented and cooperative. Patient is in no acute distress.  Skin: Skin is warm and dry. No rash noted.   Cardiovascular: Normal heart rate noted  Respiratory: Normal respiratory effort, no problems with respiration noted  Abdomen: Soft, gravid, appropriate for gestational age.  Pain/Pressure: Present     Pelvic: Cervical exam deferred        Extremities: Normal range of motion.  Edema: Trace  Mental Status: Normal mood and affect. Normal behavior. Normal judgment and thought content.   Assessment and Plan:  Pregnancy: G1P0 at [redacted]w[redacted]d 1. Pelvic pain affecting pregnancy in third trimester, antepartum - Elastic Bandages & Supports (COMFORT FIT MATERNITY SUPP SM) MISC; 1 Units by Does not apply route daily as needed.  Dispense: 1  each; Refill: 0  2. Supervision of high risk pregnancy, antepartum FHT normal. FH > dates. Has [place] next week.  3. Bipolar 1 disorder, mixed (HCC) On zoloft and seroquel. Both of these are safe to take while breastfeeding. Has SI, but no plan.  4. Generalized anxiety disorder  5. LGSIL on Pap smear of cervix Colposcopy Postpartum  6. Mild asthma without complication, unspecified whether persistent controlled  Preterm labor symptoms and general obstetric precautions including but not limited to vaginal bleeding, contractions, leaking of fluid and fetal movement were reviewed in detail with the patient. Please refer to After Visit Summary for other counseling recommendations.   No follow-ups on file.  Future Appointments  Date Time Provider Department Center  12/10/2019  9:00 AM Sheets, Ivin Booty, LCSW [facility] None  12/10/2019  2:30 PM [facility]-M[facility] NURSE [facility] MFC-US  12/10/2019  2:30 PM [facility] [place] 1 WH-MFCUS MFC-US  12/18/2019 10:00 AM Sheets, Ivin Booty, LCSW [facility] None  12/25/2019 11:00 AM Sheets, Ivin Booty, LCSW [facility] None  12/26/2019  1:15 PM WOC-BEHAVIORAL HEALTH CLINICIAN WOC-W[facility]A WOC  12/27/2019  9:00 AM [name], MD [facility] None  01/01/2020 11:00 AM Sheets, Ivin Booty, LCSW [facility] None    Levie Heritage, DO

## 2019-12-05 ENCOUNTER — Telehealth: Payer: Self-pay

## 2019-12-05 ENCOUNTER — Other Ambulatory Visit (HOSPITAL_COMMUNITY): Payer: Self-pay

## 2019-12-05 MED ORDER — QUETIAPINE FUMARATE 100 MG PO TABS: 100.0000 mg | ORAL_TABLET | Freq: Every day | ORAL | 0 refills | Status: DC

## 2019-12-05 NOTE — Telephone Encounter (Signed)
Pt called in regards to Rx for maternity support belt. Called pt and pt informed me that she went to her [facility] pharmacy and was informed that they did not receive the order.  [facility] pharmacy and was informed that the Rx was there and it was going to be filled.  I was informed that it will cost the patient 29.99.  I notified the pt and she stated thank you with no further questions.   [name], RN

## 2019-12-10 ENCOUNTER — Other Ambulatory Visit: Payer: Self-pay

## 2019-12-10 ENCOUNTER — Telehealth (HOSPITAL_COMMUNITY): Payer: No Typology Code available for payment source | Admitting: Licensed Clinical Social Worker

## 2019-12-10 ENCOUNTER — Ambulatory Visit (HOSPITAL_COMMUNITY): Payer: Medicaid Other | Attending: Obstetrics and Gynecology

## 2019-12-10 ENCOUNTER — Ambulatory Visit: Payer: Medicaid Other | Admitting: *Deleted

## 2019-12-10 ENCOUNTER — Encounter: Payer: Self-pay | Admitting: *Deleted

## 2019-12-10 DIAGNOSIS — O0990 Supervision of high risk pregnancy, unspecified, unspecified trimester: Secondary | ICD-10-CM

## 2019-12-10 DIAGNOSIS — O99891 Other specified diseases and conditions complicating pregnancy: Secondary | ICD-10-CM | POA: Diagnosis not present

## 2019-12-10 DIAGNOSIS — E669 Obesity, unspecified: Secondary | ICD-10-CM

## 2019-12-10 DIAGNOSIS — O99213 Obesity complicating pregnancy, third trimester: Secondary | ICD-10-CM

## 2019-12-10 DIAGNOSIS — O99513 Diseases of the respiratory system complicating pregnancy, third trimester: Secondary | ICD-10-CM | POA: Insufficient documentation

## 2019-12-10 DIAGNOSIS — J45909 Unspecified asthma, uncomplicated: Secondary | ICD-10-CM | POA: Insufficient documentation

## 2019-12-10 DIAGNOSIS — Z3A33 33 weeks gestation of pregnancy: Secondary | ICD-10-CM

## 2019-12-10 DIAGNOSIS — O99212 Obesity complicating pregnancy, second trimester: Secondary | ICD-10-CM | POA: Insufficient documentation

## 2019-12-10 DIAGNOSIS — O358XX0 Maternal care for other (suspected) fetal abnormality and damage, not applicable or unspecified: Secondary | ICD-10-CM | POA: Diagnosis not present

## 2019-12-10 DIAGNOSIS — Z3689 Encounter for other specified antenatal screening: Secondary | ICD-10-CM | POA: Diagnosis not present

## 2019-12-10 DIAGNOSIS — O2603 Excessive weight gain in pregnancy, third trimester: Secondary | ICD-10-CM

## 2019-12-10 DIAGNOSIS — B951 Streptococcus, group B, as the cause of diseases classified elsewhere: Secondary | ICD-10-CM

## 2019-12-11 ENCOUNTER — Other Ambulatory Visit (HOSPITAL_COMMUNITY): Payer: Self-pay

## 2019-12-11 MED ORDER — QUETIAPINE FUMARATE 100 MG PO TABS: 100.0000 mg | ORAL_TABLET | Freq: Every day | ORAL | 0 refills | Status: DC

## 2019-12-12 ENCOUNTER — Telehealth: Payer: Self-pay | Admitting: *Deleted

## 2019-12-12 NOTE — Telephone Encounter (Signed)
Pt left VM message stating that a Maternity Support belt had been ordered for her but when she went to pick it up, they didn't have the Rx. She requested that it be sent again.

## 2019-12-13 ENCOUNTER — Telehealth (INDEPENDENT_AMBULATORY_CARE_PROVIDER_SITE_OTHER): Payer: Medicaid Other

## 2019-12-13 DIAGNOSIS — O0990 Supervision of high risk pregnancy, unspecified, unspecified trimester: Secondary | ICD-10-CM

## 2019-12-13 NOTE — Telephone Encounter (Signed)
Pt called stating that she has been having some cramping in her hands and numbing in her fingers.  Called pt and pt informed me that above and that she has difficulty with closing her fists.  I informed pt that it sounds as she may have carpel tunnel in which a brace is usually advised to help with discomfort.   I informed pt that she can have her hands evaluated at her visit on 12/17/19.  I also asked pt about her maternity support belt.  I informed pt that I had spoken with [facility] myself and they said they were filling at that time and it may cost her 29.99.  Pt stated that she switched pharmacies and at this time she has decided to not use the belt because she is further along in pregnancy.  Pt did not have any other questions.   [name], RN

## 2019-12-13 NOTE — Telephone Encounter (Signed)
Pt concern addressed in another telephone encounter.   Duston Smolenski, RN  

## 2019-12-17 ENCOUNTER — Encounter: Payer: Self-pay | Admitting: Certified Nurse Midwife

## 2019-12-17 ENCOUNTER — Other Ambulatory Visit: Payer: Self-pay

## 2019-12-17 ENCOUNTER — Ambulatory Visit (INDEPENDENT_AMBULATORY_CARE_PROVIDER_SITE_OTHER): Payer: Medicaid Other | Admitting: Certified Nurse Midwife

## 2019-12-17 VITALS — BP 128/83 | HR 84 | Temp 98.1°F | Wt 215.4 lb

## 2019-12-17 DIAGNOSIS — Z3403 Encounter for supervision of normal first pregnancy, third trimester: Secondary | ICD-10-CM

## 2019-12-17 DIAGNOSIS — E669 Obesity, unspecified: Secondary | ICD-10-CM

## 2019-12-17 DIAGNOSIS — O1203 Gestational edema, third trimester: Secondary | ICD-10-CM

## 2019-12-17 DIAGNOSIS — Z3A34 34 weeks gestation of pregnancy: Secondary | ICD-10-CM

## 2019-12-17 DIAGNOSIS — O99213 Obesity complicating pregnancy, third trimester: Secondary | ICD-10-CM

## 2019-12-17 LAB — CBC
Hematocrit: 40.9 % (ref 34.0–46.6)
Hemoglobin: 14.2 g/dL (ref 11.1–15.9)
MCH: 33.6 pg — ABNORMAL HIGH (ref 26.6–33.0)
MCHC: 34.7 g/dL (ref 31.5–35.7)
MCV: 97 fL (ref 79–97)
Platelets: 174 10*3/uL (ref 150–450)
RBC: 4.22 x10E6/uL (ref 3.77–5.28)
RDW: 12.2 % (ref 11.7–15.4)
WBC: 8.9 10*3/uL (ref 3.4–10.8)

## 2019-12-17 NOTE — Patient Instructions (Addendum)
Considering Waterbirth? Guide for patients at [facility] [employer] Why consider waterbirth? . Gentle birth for babies  . Less pain medicine used in labor  . May allow for passive descent/less pushing  . May reduce perineal tears  . More mobility and instinctive maternal position changes  . Increased maternal relaxation  . Reduced blood pressure in labor   Is waterbirth safe? What are the risks of infection, drowning or other complications? . Infection:  Marland Kitchen Very low risk (3.7 % for tub vs 4.8% for bed)  . 7 in 50 waterbirths with documented infection  . Poorly cleaned equipment most common cause  . Slightly lower group B strep transmission rate  . Drowning  . Maternal:  . Very low risk  . Related to seizures or fainting  . Newborn:  Marland Kitchen Very low risk. No evidence of increased risk of respiratory problems in multiple large studies  . Physiological protection from breathing under water  . Avoid underwater birth if there are any fetal complications  . Once baby's head is out of the water, keep it out.  . Birth complication  . Some reports of cord trauma, but risk decreased by bringing baby to surface gradually  . No evidence of increased risk of shoulder dystocia. Mothers can usually change positions faster in water than in a bed, possibly aiding the maneuvers to free the shoulder.  ? You must attend a Waterbirth class at [facility] . 3rd Wednesday of every month from 7-9pm  . Free  . Register by calling [phone] or online at [email or web address]  . Bring [place] the certificate from the class to your prenatal appointment  Meet with a midwife at 36 weeks to see if you can still plan a waterbirth and to sign the consent.   If you plan a waterbirth at [facility], you can opt to purchase the following: . Fish Net . Bathing suit top (optional)  . Long-handled mirror (optional)  .  Things that would  prevent you from having a waterbirth: . Unknown or Positive COVID-19 diagnosis upon admission to hospital  . Premature, <37wks  . Previous cesarean birth  . Presence of thick meconium-stained fluid  . Multiple gestation (Twins, triplets, etc.)  . Uncontrolled diabetes or gestational diabetes requiring medication  . Hypertension requiring medication or diagnosis of pre-eclampsia  . Heavy vaginal bleeding  . Non-reassuring fetal heart rate  . Active infection (MRSA, etc.). Group B Strep is NOT a contraindication for waterbirth.  . If your labor has to be induced and induction method requires continuous monitoring of the baby's heart rate  . Other risks/issues identified by your obstetrical provider  Please remember that birth is unpredictable. Under certain unforeseeable circumstances your provider may advise against giving birth in the tub. These decisions will be made on a case-by-case basis and with the safety of you and your baby as our highest priority.  **Please remember that in order to have a waterbirth, you must test Negative to COVID-19 upon admission to the hospital.**  Group B Streptococcus Infection During Pregnancy Group B Streptococcus (GBS) is a type of bacteria that is often found in healthy people. It is commonly found in the rectum, vagina, and intestines. In people who are healthy and not pregnant, the bacteria rarely cause serious illness or complications. However, women who test positive for GBS during pregnancy can pass the bacteria to the baby during childbirth. This can cause serious infection  in the baby after birth. Women with GBS may also have infections during their pregnancy or soon after childbirth. The infections include urinary tract infections (UTIs) or infections of the uterus. GBS also increases a woman's risk of complications during pregnancy, such as early labor or delivery, miscarriage, or stillbirth. Routine testing for GBS is recommended for all pregnant  women. What are the causes? This condition is caused by bacteria called Streptococcus agalactiae. What increases the risk? You may have a higher risk for GBS infection during pregnancy if you had one during a past pregnancy. What are the signs or symptoms? In most cases, GBS infection does not cause symptoms in pregnant women. If symptoms exist, they may include:  Labor that starts before the 37th week of pregnancy.  A UTI or bladder infection. This may cause a fever, frequent urination, or pain and burning during urination.  Fever during labor. There can also be a rapid heartbeat in the mother or baby. Rare but serious symptoms of a GBS infection in women include:  Blood infection (septicemia). This may cause fever, chills, or confusion.  Lung infection (pneumonia). This may cause fever, chills, cough, rapid breathing, chest pain, or difficulty breathing.  Bone, joint, skin, or soft tissue infection. How is this diagnosed? You may be screened for GBS between week 35 and week 37 of pregnancy. If you have symptoms of preterm labor, you may be screened earlier. This condition is diagnosed based on lab test results from:  A swab of fluid from the vagina and rectum.  A urine sample. How is this treated? This condition is treated with antibiotic medicine. Antibiotic medicine may be given:  To you when you go into labor, or as soon as your water breaks. The medicines will continue until after you give birth. If you are having a cesarean delivery, you do not need antibiotics unless your water has broken.  To your baby, if he or she requires treatment. Your health care provider will check your baby to decide if he or she needs antibiotics to prevent a serious infection. Follow these instructions at home:  Take over-the-counter and prescription medicines only as told by your health care provider.  Take your antibiotic medicine as told by your health care provider. Do not stop taking the  antibiotic even if you start to feel better.  Keep all pre-birth (prenatal) visits and follow-up visits as told by your health care provider. This is important. Contact a health care provider if:  You have pain or burning when you urinate.  You have to urinate more often than usual.  You have a fever or chills.  You develop a bad-smelling vaginal discharge. Get help right away if:  Your water breaks.  You go into labor.  You have severe pain in your abdomen.  You have difficulty breathing.  You have chest pain. These symptoms may represent a serious problem that is an emergency. Do not wait to see if the symptoms will go away. Get medical help right away. Call your local emergency services (911 in the [place] Do not drive yourself to the hospital. Summary  GBS is a type of bacteria that is common in healthy people.  During pregnancy, colonization with GBS can cause serious complications for you or your baby.  Your health care provider will screen you between 35 and 37 weeks of pregnancy to determine if you are colonized with GBS.  If you are colonized with GBS during pregnancy, your health care provider will recommend antibiotics  through an IV during labor.  After delivery, your baby will be evaluated for complications related to potential GBS infection and may require antibiotics to prevent a serious infection. This information is not intended to replace advice given to you by your health care provider. Make sure you discuss any questions you have with your health care provider. Document Revised: 02/19/2019 Document Reviewed: 02/19/2019 Elsevier Patient Education  2020 [employer].

## 2019-12-18 ENCOUNTER — Ambulatory Visit (INDEPENDENT_AMBULATORY_CARE_PROVIDER_SITE_OTHER): Payer: No Typology Code available for payment source | Admitting: Licensed Clinical Social Worker

## 2019-12-18 ENCOUNTER — Telehealth (INDEPENDENT_AMBULATORY_CARE_PROVIDER_SITE_OTHER): Payer: Medicaid Other | Admitting: General Practice

## 2019-12-18 DIAGNOSIS — Z3A34 34 weeks gestation of pregnancy: Secondary | ICD-10-CM

## 2019-12-18 DIAGNOSIS — Z3483 Encounter for supervision of other normal pregnancy, third trimester: Secondary | ICD-10-CM

## 2019-12-18 DIAGNOSIS — F3160 Bipolar disorder, current episode mixed, unspecified: Secondary | ICD-10-CM

## 2019-12-18 LAB — PROTEIN / CREATININE RATIO, URINE
Creatinine, Urine: 140.6 mg/dL
Protein, Ur: 17.4 mg/dL
Protein/Creat Ratio: 124 mg/g creat (ref 0–200)

## 2019-12-18 LAB — COMPREHENSIVE METABOLIC PANEL
ALT: 11 IU/L (ref 0–32)
AST: 14 IU/L (ref 0–40)
Albumin/Globulin Ratio: 1.8 (ref 1.2–2.2)
Albumin: 3.7 g/dL — ABNORMAL LOW (ref 3.9–5.0)
Alkaline Phosphatase: 111 IU/L (ref 39–117)
BUN/Creatinine Ratio: 15 (ref 9–23)
BUN: 8 mg/dL (ref 6–20)
Bilirubin Total: <0.2 mg/dL (ref 0.0–1.2)
CO2: 22 mmol/L (ref 20–29)
Calcium: 8.7 mg/dL (ref 8.7–10.2)
Chloride: 108 mmol/L — ABNORMAL HIGH (ref 96–106)
Creatinine, Ser: 0.54 mg/dL — ABNORMAL LOW (ref 0.57–1.00)
GFR calc Af Amer: 155 mL/min/{1.73_m2} (ref >59)
GFR calc non Af Amer: 134 mL/min/{1.73_m2} (ref >59)
Globulin, Total: 2.1 g/dL (ref 1.5–4.5)
Glucose: 92 mg/dL (ref 65–99)
Potassium: 3.7 mmol/L (ref 3.5–5.2)
Sodium: 142 mmol/L (ref 134–144)
Total Protein: 5.8 g/dL — ABNORMAL LOW (ref 6.0–8.5)

## 2019-12-18 NOTE — Progress Notes (Signed)
PRENATAL VISIT NOTE  Subjective:  Sharon Ward is a 24 y.o. G1P0 at 88w4dbeing seen today for ongoing prenatal care.  She is currently monitored for the following issues for this low-risk pregnancy and has Anxiety; Asthma; Bipolar disorder ([place]; Depression; LGSIL on Pap smear of cervix; Oppositional defiant disorder; H/O psychiatric hospitalization; Seasonal allergies; Encounter for supervision of normal first pregnancy in third trimester; Vaginal bleeding during pregnancy; Obesity affecting pregnancy; Late prenatal care affecting pregnancy in second trimester; Group beta Strep positive; and Excess weight gain in pregnancy on their problem list.  Patient reports occasional contractions.  Contractions: Irregular. Vag. Bleeding: None.  Movement: Present. Denies leaking of fluid.   The following portions of the patient's history were reviewed and updated as appropriate: allergies, current medications, past family history, past medical history, past social history, past surgical history and problem list.   Objective:   Vitals:   12/17/19 1615 12/17/19 1622  BP: 138/83 128/83  Pulse: 84   Temp: 98.1 F (36.7 C)   Weight: 215 lb 6.4 oz (97.7 kg)     Fetal Status: Fetal Heart Rate (bpm): 152 Fundal Height: 40 cm Movement: Present     General:  Alert, oriented and cooperative. Patient is in no acute distress.  Skin: Skin is warm and dry. No rash noted.   Cardiovascular: Normal heart rate noted  Respiratory: Normal respiratory effort, no problems with respiration noted  Abdomen: Soft, gravid, appropriate for gestational age.  Pain/Pressure: Present     Pelvic: Cervical exam deferred        Extremities: Normal range of motion.  Edema: Deep pitting, indentation remains for a short time  Mental Status: Normal mood and affect. Normal behavior. Normal judgment and thought content.   Assessment and Plan:  Pregnancy: G1P0 at 373w5d. Encounter for supervision of normal first pregnancy in  third trimester - Patient doing well, reports occasional BH contractions  - Routine prenatal care - Anticipatory guidance on upcoming appointments including GC/C at next visit with cervical examination  - patient plans waterbirth and class certificate scanned into chart  - Answered a series of patient's questions   2. Obesity affecting pregnancy in third trimester - TWG 62lb during pregnancy  -  Est. FW:    2493  gm      5 lb 8 oz     76  % on 5/3 @ 3346w4d. Gestational edema in third trimester - Patient reports increased swelling of feet and hands over the last 2-3 weeks  - +2 pitting edema noted bilaterally  - encouraged patient to increase amount of water she consumes, elevate feet when sitting and wear compression socks while at work  - Patient denies vision changes or RUQ pain, patient reports having occasional headaches, PEC labs obtained  - BP today 138/83 - CBC - Comp Met (CMET) - Protein / creatinine ratio, urine  Preterm labor symptoms and general obstetric precautions including but not limited to vaginal bleeding, contractions, leaking of fluid and fetal movement were reviewed in detail with the patient. Please refer to After Visit Summary for other counseling recommendations.   Return in about 2 weeks (around 12/31/2019) for ROB/GC with midwife .  Future Appointments  Date Time Provider DepOrchard/18/2021 11:00 AM Sheets, JosVonna KotykCSW [facility] None  12/26/2019  1:15 PM [facility]  9:00 AM AkhMerian CapronD [facility] None  01/01/2020 11:00 AM Sheets, JosVonna KotykCSW [facility] None  01/01/2020  1:15 PM  Virginia Rochester, NP [facility]    Lajean Manes, CNM

## 2019-12-18 NOTE — Telephone Encounter (Signed)
Patient called and left message on nurse voicemail line stating she saw her results in mychart and doesn't understand them. She also states she doesn't know if she is having braxton hicks or actual contractions. Called patient and discussed her results come back at the same time we receive them and it doesn't look like Suzette Battiest has had a chance to review them yet. Discussed if there is anything concerning Suzette Battiest will reach out to her. Patient verbalized understanding and states she isn't sure if she is having contractions or braxton hicks contractions. Patient states she was having menstrual cramps off and on yesterday evening that were quite painful in her lower abdomen. She says she had the same pain again getting out of bed this morning. Patient also notes diarrhea yesterday and soft stools today. Patient states she has only had the menstrual cramps once or twice today. Discussed at length with patient braxton hicks contractions versus labor contractions. Discussed when to come into office or MAU for evaluation. Patient verbalized understanding.

## 2019-12-19 NOTE — Progress Notes (Signed)
   THERAPIST PROGRESS NOTE  Session Time: 10:00 am-10:45 am  Participation Level: Active  Behavioral Response: CasualAlertAnxious and Depressed  Type of Therapy: Individual Therapy  Treatment Goals addressed: Coping  Interventions: CBT and Solution Focused  Summary: Sharon Ward is a 24 y.o. female who presents oriented x5 (person, place, situation, time and object), casually dressed, appropriately groomed, average height, increased weight due to pregnancy, and cooperative to address mood and anxiety. Patient has a history of medical treatment including asthma and seasonal allergies. Patient has a history of mental health treatment including medication management, outpatient therapy, and treatment for eating disorder. Patient admits to passive SI but denies plan and means. Patient denies homicidal ideations. Patient denies psychosis including auditory and visual hallucinations. Patient denies substance abuse. Patient is at low risk for lethality.  Session #1  Physically: Patient has gained weight since her pregnancy. She feels like it has been excessive weight. Patient has felt like she has forced herself to eat. Patient's sleep is ok at night. Patient is experiencing braxton hicks. She is 6 weeks away from her due date.  Spiritually/values: No issues identified.  Relationships: Patient feels like she had difficulty communicating with her boyfriend. She gets overwhelmed and shuts down. Patient feels like her boyfriend wants to talk about things immediately but she needs to cool down and when he doesn't allow her she escalates to saying things she regrets.  Emotionally/Mentally/Behavior: Patient has experienced anxiety, depression and irritability. She is not feeling good about herself physically. Patient understood that she needs to work on bringing awareness to her feelings in order to manage them.   Patient engaged in session. She responded well to interventions. Patient continues to meet  criteria for Bipolar 1, mixed and GAD. Patient will continue in outpatient therapy due to being the least restrictive service to meet her needs. Patient made minimal progress on her goals at this time.   Suicidal/Homicidal: Negativewithout intent/plan  Therapist Response: Therapist reviewed patient's recent thoughts and behaviors. Therapist utilized CBT to address mood and anxiety. Therapist processed patient's feelings to identify triggers for mood and anxiety. Therapist discussed patient's view of herself, cycle of anxiety as well as irritability, and to bring awareness to her feelings.   Plan: Return again in 1 weeks.  Diagnosis: Axis I: Bipolar, mixed and Generalized Anxiety Disorder    Axis II: No diagnosis    Bynum Bellows, LCSW 12/19/2019

## 2019-12-20 ENCOUNTER — Telehealth: Payer: Self-pay | Admitting: Family Medicine

## 2019-12-20 ENCOUNTER — Inpatient Hospital Stay (HOSPITAL_COMMUNITY)
Admission: AD | Admit: 2019-12-20 | Discharge: 2019-12-20 | Disposition: A | Discharge status: Home / Self Care | Payer: Medicaid Other | Attending: Obstetrics & Gynecology | Admitting: Obstetrics & Gynecology

## 2019-12-20 ENCOUNTER — Encounter (HOSPITAL_COMMUNITY): Payer: Self-pay | Admitting: Obstetrics & Gynecology

## 2019-12-20 ENCOUNTER — Other Ambulatory Visit: Payer: Self-pay

## 2019-12-20 DIAGNOSIS — R102 Pelvic and perineal pain: Secondary | ICD-10-CM

## 2019-12-20 DIAGNOSIS — Z3A35 35 weeks gestation of pregnancy: Secondary | ICD-10-CM | POA: Diagnosis not present

## 2019-12-20 DIAGNOSIS — B951 Streptococcus, group B, as the cause of diseases classified elsewhere: Secondary | ICD-10-CM

## 2019-12-20 DIAGNOSIS — O26893 Other specified pregnancy related conditions, third trimester: Secondary | ICD-10-CM

## 2019-12-20 DIAGNOSIS — Z87891 Personal history of nicotine dependence: Secondary | ICD-10-CM | POA: Insufficient documentation

## 2019-12-20 DIAGNOSIS — O4703 False labor before 37 completed weeks of gestation, third trimester: Secondary | ICD-10-CM | POA: Diagnosis present

## 2019-12-20 DIAGNOSIS — O99213 Obesity complicating pregnancy, third trimester: Secondary | ICD-10-CM

## 2019-12-20 DIAGNOSIS — Z79899 Other long term (current) drug therapy: Secondary | ICD-10-CM | POA: Diagnosis not present

## 2019-12-20 DIAGNOSIS — O479 False labor, unspecified: Secondary | ICD-10-CM

## 2019-12-20 DIAGNOSIS — Z3403 Encounter for supervision of normal first pregnancy, third trimester: Secondary | ICD-10-CM

## 2019-12-20 DIAGNOSIS — O2603 Excessive weight gain in pregnancy, third trimester: Secondary | ICD-10-CM

## 2019-12-20 LAB — URINALYSIS, ROUTINE W REFLEX MICROSCOPIC
Bilirubin Urine: NEGATIVE
Glucose, UA: NEGATIVE mg/dL
Hgb urine dipstick: NEGATIVE
Ketones, ur: NEGATIVE mg/dL
Leukocytes,Ua: NEGATIVE
Nitrite: NEGATIVE
Protein, ur: NEGATIVE mg/dL
Specific Gravity, Urine: 1.008 (ref 1.005–1.030)
pH: 8 (ref 5.0–8.0)

## 2019-12-20 MED ORDER — COMFORT FIT MATERNITY SUPP SM MISC: 1.0000 [IU] | Freq: Every day | 0 refills | Status: DC | PRN

## 2019-12-20 NOTE — MAU Provider Note (Signed)
History     CSN: [id number]  Arrival date and time: 12/20/19 1308   First Provider Initiated Contact with Patient 12/20/19 1349      Chief Complaint  Patient presents with  . Pelvic Pain   HPI  Sharon Ward is a 24 y.o. G1P0 at 11w0dwho presents to MAU today with complaint of increased pelvic pressure and contractions. The patient states that she noted BH contractions since Monday night that have become more frequent and more painful today. She states contractions are worse at night and with a full bladder. She states ~6 contractions noted today since 9am. She denies vaginal bleeding or LOF. She has noted a mucous discharge today. She report normal fetal movement. She rates her pain at 4/10 and has not taken anything for pain.    OB History    Gravida  1   Para  0   Term      Preterm      AB      Living        SAB      TAB      Ectopic      Multiple      Live Births              Past Medical History:  Diagnosis Date  . Anxiety   . Asthma   . Depression     Past Surgical History:  Procedure Laterality Date  . PILONIDAL CYST / SINUS EXCISION  09/11/2013  . PILONIDAL CYST EXCISION  05/17/2014   Pilonidal cystectomy with cleft lip    Family History  Problem Relation Age of Onset  . Healthy Mother   . Healthy Father     Social History   Tobacco Use  . Smoking status: Former SResearch scientist (life sciences) . Smokeless tobacco: Never Used  . Tobacco comment: only smoke a "couple" of cigarettes when stressed or anxious, socially with [facility]  Substance Use Topics  . Alcohol use: Not Currently    Comment: occasional prior to pregnancy  . 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 Citrus   . Coconut Flavor   . Lamotrigine   . Orange (Diagnostic)   . Peach Flavor   . Pear   . Pineapple   . Ascorbate Rash    Medications Prior to  Admission  Medication Sig Dispense Refill Last Dose  . acetaminophen (TYLENOL) 325 MG tablet Take 325 mg by mouth every 6 (six) hours as needed for mild pain or headache.   Past Week at Unknown time  . docusate sodium (COLACE) 100 MG capsule Take 100 mg by mouth daily.    12/19/2019 at Unknown time  . ferrous sulfate 325 (65 FE) MG tablet Take 325 mg by mouth daily with breakfast.   12/19/2019 at Unknown time  . folic acid (FOLVITE) 4021MCG tablet Take 400 mcg by mouth daily.   12/19/2019 at Unknown time  . Prenatal Vit-Fe Fumarate-FA (MULTIVITAMIN-PRENATAL) 27-0.8 MG TABS tablet Take 1 tablet by mouth daily at 12 noon.   12/19/2019 at Unknown time  . QUEtiapine (SEROQUEL) 100 MG tablet Take 1 tablet (100 mg total) by mouth at bedtime. 30 tablet 0 12/19/2019 at Unknown time  . sertraline (ZOLOFT) 50 MG tablet Take 1.5 tablets (75 mg total) by mouth daily. Refill when due. Dose increased today 45  tablet 0 12/19/2019 at Unknown time  . Blood Pressure Monitoring (BLOOD PRESSURE KIT) DEVI 1 Device by Does not apply route as needed. 1 each 0   . [DISCONTINUED] Elastic Bandages & Supports (COMFORT FIT MATERNITY SUPP SM) MISC 1 Units by Does not apply route daily as needed. 1 each 0     Review of Systems  Constitutional: Negative for fever.  Gastrointestinal: Positive for abdominal pain.  Genitourinary: Positive for vaginal discharge. Negative for dysuria, frequency, urgency and vaginal bleeding.   Physical Exam   Blood pressure 124/69, pulse 67, temperature 98.2 F (36.8 C), temperature source Oral, resp. rate 16, height 5' 3"  (1.6 m), weight 97.1 kg, last menstrual period 04/19/2019, SpO2 97 %.  Physical Exam  Nursing note and vitals reviewed. Constitutional: She is oriented to person, place, and time. She appears well-developed and well-nourished. No distress.  HENT:  Head: Normocephalic and atraumatic.  Cardiovascular: Normal rate.  Respiratory: Effort normal.  GI: Soft. She exhibits no  distension and no mass. There is no abdominal tenderness. There is no rebound and no guarding.  Neurological: She is alert and oriented to person, place, and time.  Skin: Skin is warm and dry. No erythema.  Psychiatric: She has a normal mood and affect.  Dilation: Fingertip Effacement (%): Thick Station: Ballotable Exam by:: Tomi Bamberger, PA-C    Results for orders placed or performed during the hospital encounter of 12/20/19 (from the past 24 hour(s))  Urinalysis, Routine w reflex microscopic     Status: Abnormal   Collection Time: 12/20/19  1:12 PM  Result Value Ref Range   Color, Urine YELLOW YELLOW   APPearance HAZY (A) CLEAR   Specific Gravity, Urine 1.008 1.005 - 1.030   pH 8.0 5.0 - 8.0   Glucose, UA NEGATIVE NEGATIVE mg/dL   Hgb urine dipstick NEGATIVE NEGATIVE   Bilirubin Urine NEGATIVE NEGATIVE   Ketones, ur NEGATIVE NEGATIVE mg/dL   Protein, ur NEGATIVE NEGATIVE mg/dL   Nitrite NEGATIVE NEGATIVE   Leukocytes,Ua NEGATIVE NEGATIVE    Fetal Monitoring: Baseline: 140 bpm Variability: moderate Accelerations: 15 x 15 Decelerations: none Contractions: few, irregular   MAU Course  Procedures None  MDM UA today - normal NST with TOCO x 1 hour with few contractions Cervix rechecked without change  Assessment and Plan  A: SIUP at 39w0dBraxton hicks contractions  Pelvic pressure   P: Discharge home Tylenol PRN for pain Rx for abdominal binder given to patient to take to BHendersonsince she has not been able to fill previous Rx at other pharmacy  Preterm labor precautions discussed Patient advised to follow-up with [facility] as scheduled for routine prenatal care Patient may return to MAU as needed or if her condition were to change or worsen   JKerry Hough PA-C 12/20/2019, 3:07 PM

## 2019-12-20 NOTE — Discharge Instructions (Signed)
[facility], [place], [place] [address] Phone: [phone]  Monday     8:30AM-5PM Tuesday 8:30AM-5PM Wednesday 8:30AM-5PM Thursday 8:30AM-5PM Friday  8:30AM-5PM Saturday Closed Sunday Closed    Braxton Hicks Contractions Contractions of the uterus can occur throughout pregnancy, but they are not always a sign that you are in labor. You may have practice contractions called Braxton Hicks contractions. These false labor contractions are sometimes confused with true labor. What are [place] contractions? Braxton Hicks contractions are tightening movements that occur in the muscles of the uterus before labor. Unlike true labor contractions, these contractions do not result in opening (dilation) and thinning of the cervix. Toward the end of pregnancy (32-34 weeks), Braxton Hicks contractions can happen more often and may become stronger. These contractions are sometimes difficult to tell apart from true labor because they can be very uncomfortable. You should not feel embarrassed if you go to the hospital with false labor. Sometimes, the only way to tell if you are in true labor is for your health care provider to look for changes in the cervix. The health care provider will do a physical exam and may monitor your contractions. If you are not in true labor, the exam should show that your cervix is not dilating and your water has not broken. If there are no other health problems associated with your pregnancy, it is completely safe for you to be sent home with false labor. You may continue to have Braxton Hicks contractions until you go into true labor. How to tell the difference between true labor and false labor True labor  Contractions last 30-70 seconds.  Contractions become very regular.  Discomfort is usually felt in the top of the uterus, and it spreads to the lower abdomen and low back.  Contractions do not go away with  walking.  Contractions usually become more intense and increase in frequency.  The cervix dilates and gets thinner. False labor  Contractions are usually shorter and not as strong as true labor contractions.  Contractions are usually irregular.  Contractions are often felt in the front of the lower abdomen and in the groin.  Contractions may go away when you walk around or change positions while lying down.  Contractions get weaker and are shorter-lasting as time goes on.  The cervix usually does not dilate or become thin. Follow these instructions at home:   Take over-the-counter and prescription medicines only as told by your health care provider.  Keep up with your usual exercises and follow other instructions from your health care provider.  Eat and drink lightly if you think you are going into labor.  If Braxton Hicks contractions are making you uncomfortable: ? Change your position from lying down or resting to walking, or change from walking to resting. ? Sit and rest in a tub of warm water. ? Drink enough fluid to keep your urine pale yellow. Dehydration may cause these contractions. ? Do slow and deep breathing several times an hour.  Keep all follow-up prenatal visits as told by your health care provider. This is important. Contact a health care provider if:  You have a fever.  You have continuous pain in your abdomen. Get help right away if:  Your contractions become stronger, more regular, and closer together.  You have fluid leaking or gushing from your vagina.  You pass blood-tinged mucus (bloody show).  You have bleeding from your vagina.  You have low back pain  that you never had before.  You feel your baby's head pushing down and causing pelvic pressure.  Your baby is not moving inside you as much as it used to. Summary  Contractions that occur before labor are called Braxton Hicks contractions, false labor, or practice contractions.  Braxton  Hicks contractions are usually shorter, weaker, farther apart, and less regular than true labor contractions. True labor contractions usually become progressively stronger and regular, and they become more frequent.  Manage discomfort from [employer] contractions by changing position, resting in a warm bath, drinking plenty of water, or practicing deep breathing. This information is not intended to replace advice given to you by your health care provider. Make sure you discuss any questions you have with your health care provider. Document Revised: 07/08/2017 Document Reviewed: 12/09/2016 Elsevier Patient Education  2020 Elsevier Inc.  Fetal Movement Counts Patient Name: ________________________________________________ Patient Due Date: ____________________ What is a fetal movement count?  A fetal movement count is the number of times that you feel your baby move during a certain amount of time. This may also be called a fetal kick count. A fetal movement count is recommended for every pregnant woman. You may be asked to start counting fetal movements as early as week 28 of your pregnancy. Pay attention to when your baby is most active. You may notice your baby's sleep and wake cycles. You may also notice things that make your baby move more. You should do a fetal movement count:  When your baby is normally most active.  At the same time each day. A good time to count movements is while you are resting, after having something to eat and drink. How do I count fetal movements? 1. Find a quiet, comfortable area. Sit, or lie down on your side. 2. Write down the date, the start time and stop time, and the number of movements that you felt between those two times. Take this information with you to your health care visits. 3. Write down your start time when you feel the first movement. 4. Count kicks, flutters, swishes, rolls, and jabs. You should feel at least 10 movements. 5. You may stop  counting after you have felt 10 movements, or if you have been counting for 2 hours. Write down the stop time. 6. If you do not feel 10 movements in 2 hours, contact your health care provider for further instructions. Your health care provider may want to do additional tests to assess your baby's well-being. Contact a health care provider if:  You feel fewer than 10 movements in 2 hours.  Your baby is not moving like he or she usually does. Date: ____________ Start time: ____________ Stop time: ____________ Movements: ____________ Date: ____________ Start time: ____________ Stop time: ____________ Movements: ____________ Date: ____________ Start time: ____________ Stop time: ____________ Movements: ____________ Date: ____________ Start time: ____________ Stop time: ____________ Movements: ____________ Date: ____________ Start time: ____________ Stop time: ____________ Movements: ____________ Date: ____________ Start time: ____________ Stop time: ____________ Movements: ____________ Date: ____________ Start time: ____________ Stop time: ____________ Movements: ____________ Date: ____________ Start time: ____________ Stop time: ____________ Movements: ____________ Date: ____________ Start time: ____________ Stop time: ____________ Movements: ____________ This information is not intended to replace advice given to you by your health care provider. Make sure you discuss any questions you have with your health care provider. Document Revised: 03/15/2019 Document Reviewed: 03/15/2019 Elsevier Patient Education  2020 [employer].

## 2019-12-20 NOTE — Telephone Encounter (Signed)
Patient state she's in a lot of pain and want to speak with a nurse

## 2019-12-20 NOTE — Telephone Encounter (Signed)
Returned patients call. Patient did not answer. LM for patient to call the office at her convenience if she is still having difficulty.

## 2019-12-20 NOTE — MAU Note (Signed)
Sharon Ward is a 24 y.o. at [redacted]w[redacted]d here in MAU reporting: been having BH since Saturday. Started getting worse Monday night. Having a lot of pelvic pressure. No LOF. Had some spotting after IC yesterday but it has gone away. +FM  Onset of complaint: ongoing  Pain score: 4/10  Vitals:   12/20/19 1322  BP: 127/67  Pulse: 94  Resp: 16  Temp: 98.2 F (36.8 C)  SpO2: 99%     FHT: +FM  Lab orders placed from triage: UA

## 2019-12-20 NOTE — BH Specialist Note (Addendum)
Integrated Behavioral Health via Telemedicine Phone Visit  12/20/2019 Almeter Westhoff [id number]  Number of Integrated Behavioral Health visits: 3 Session Start time: 1:16  Session End time: 1:20 Total time: 4   Pt forgot about visit today, as she is in the middle of moving. Pt requests to be sent housing and transportation resources via MyChart message, as she was unable to find via After Visit Summary. Pt also requests list of therapists who specialize in eating disorders and body image issues. Pt is established with both psychiatrist and therapist, so no need for follow up appointment at this time; agrees to call [facility] at [phone] with any further questions or concerns.   Valetta Close Ireanna Finlayson

## 2019-12-25 ENCOUNTER — Ambulatory Visit (HOSPITAL_COMMUNITY): Payer: No Typology Code available for payment source | Admitting: Licensed Clinical Social Worker

## 2019-12-26 ENCOUNTER — Other Ambulatory Visit: Payer: Self-pay

## 2019-12-26 ENCOUNTER — Ambulatory Visit (INDEPENDENT_AMBULATORY_CARE_PROVIDER_SITE_OTHER): Payer: No Typology Code available for payment source | Admitting: Clinical

## 2019-12-26 DIAGNOSIS — Z658 Other specified problems related to psychosocial circumstances: Secondary | ICD-10-CM

## 2019-12-27 ENCOUNTER — Telehealth (HOSPITAL_COMMUNITY): Payer: No Typology Code available for payment source | Admitting: Psychiatry

## 2019-12-28 ENCOUNTER — Encounter (HOSPITAL_COMMUNITY): Payer: Self-pay | Admitting: Psychiatry

## 2019-12-28 ENCOUNTER — Telehealth (INDEPENDENT_AMBULATORY_CARE_PROVIDER_SITE_OTHER): Payer: No Typology Code available for payment source | Admitting: Psychiatry

## 2019-12-28 DIAGNOSIS — F3160 Bipolar disorder, current episode mixed, unspecified: Secondary | ICD-10-CM | POA: Diagnosis not present

## 2019-12-28 DIAGNOSIS — F411 Generalized anxiety disorder: Secondary | ICD-10-CM

## 2019-12-28 DIAGNOSIS — Z8659 Personal history of other mental and behavioral disorders: Secondary | ICD-10-CM

## 2019-12-28 MED ORDER — QUETIAPINE FUMARATE 50 MG PO TABS: 50.0000 mg | ORAL_TABLET | Freq: Every day | ORAL | 0 refills | Status: DC
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
## 2020-01-01 ENCOUNTER — Encounter: Payer: Self-pay | Admitting: Nurse Practitioner

## 2020-01-01 ENCOUNTER — Other Ambulatory Visit: Payer: Self-pay

## 2020-01-01 ENCOUNTER — Ambulatory Visit (INDEPENDENT_AMBULATORY_CARE_PROVIDER_SITE_OTHER): Payer: No Typology Code available for payment source | Admitting: Licensed Clinical Social Worker

## 2020-01-01 ENCOUNTER — Ambulatory Visit (INDEPENDENT_AMBULATORY_CARE_PROVIDER_SITE_OTHER): Payer: Medicaid Other | Admitting: Nurse Practitioner

## 2020-01-01 ENCOUNTER — Other Ambulatory Visit (HOSPITAL_COMMUNITY)
Admission: RE | Admit: 2020-01-01 | Discharge: 2020-01-01 | Disposition: A | Discharge status: Home / Self Care | Payer: Medicaid Other | Source: Ambulatory Visit | Attending: Nurse Practitioner | Admitting: Nurse Practitioner

## 2020-01-01 VITALS — BP 121/70 | HR 79 | Wt 224.6 lb

## 2020-01-01 DIAGNOSIS — F3160 Bipolar disorder, current episode mixed, unspecified: Secondary | ICD-10-CM

## 2020-01-01 DIAGNOSIS — O1203 Gestational edema, third trimester: Secondary | ICD-10-CM

## 2020-01-01 DIAGNOSIS — R030 Elevated blood-pressure reading, without diagnosis of hypertension: Secondary | ICD-10-CM

## 2020-01-01 DIAGNOSIS — Z3403 Encounter for supervision of normal first pregnancy, third trimester: Secondary | ICD-10-CM

## 2020-01-01 DIAGNOSIS — Z3A36 36 weeks gestation of pregnancy: Secondary | ICD-10-CM

## 2020-01-01 LAB — POCT URINALYSIS DIP (DEVICE)
Bilirubin Urine: NEGATIVE
Glucose, UA: NEGATIVE mg/dL
Hgb urine dipstick: NEGATIVE
Ketones, ur: NEGATIVE mg/dL
Nitrite: NEGATIVE
Protein, ur: NEGATIVE mg/dL
Specific Gravity, Urine: 1.025 (ref 1.005–1.030)
Urobilinogen, UA: 0.2 mg/dL (ref 0.0–1.0)
pH: 7 (ref 5.0–8.0)

## 2020-01-01 NOTE — Patient Instructions (Signed)
Wear com0pression knee highs

## 2020-01-01 NOTE — Progress Notes (Signed)
    Subjective:  Randye Treichler is a 24 y.o. G1P0 at [redacted]w[redacted]d being seen today for ongoing prenatal care.  She is currently monitored for the following issues for this high-risk pregnancy and has Anxiety; Asthma; Bipolar disorder (HCC); Depression; LGSIL on Pap smear of cervix; Oppositional defiant disorder; H/O psychiatric hospitalization; Seasonal allergies; Encounter for supervision of normal first pregnancy in third trimester; Vaginal bleeding during pregnancy; Obesity affecting pregnancy; Late prenatal care affecting pregnancy in second trimester; Group beta Strep positive; and Excess weight gain in pregnancy on their problem list.  Patient reports edema of lower legs, feet, and hands.  Contractions: Irritability. Vag. Bleeding: None.  Movement: Present. Denies leaking of fluid.   The following portions of the patient's history were reviewed and updated as appropriate: allergies, current medications, past family history, past medical history, past social history, past surgical history and problem list. Problem list updated.  Objective:   Vitals:   01/01/20 1331  BP: 121/70  Pulse: 79  Weight: 224 lb 9.6 oz (101.9 kg)    Fetal Status: Fetal Heart Rate (bpm): 156 Fundal Height: 44 cm Movement: Present     General:  Alert, oriented and cooperative. Patient is in no acute distress.  Skin: Skin is warm and dry. No rash noted.   Cardiovascular: Normal heart rate noted  Respiratory: Normal respiratory effort, no problems with respiration noted  Abdomen: Soft, gravid, appropriate for gestational age. Pain/Pressure: Present     Pelvic:  Cervical exam deferred        Extremities: Normal range of motion.  Edema: Deep pitting, indentation remains for a short time  Mental Status: Normal mood and affect. Normal behavior. Normal judgment and thought content.   Urinalysis:      Assessment and Plan:  Pregnancy: G1P0 at [redacted]w[redacted]d  1. Encounter for supervision of normal first pregnancy in third  trimester Very edematous.   Has had some periodic headaches but always relieved with tylenol. Has cloudy vision for 2 weeks but no blurred vision.  Some mild lights that flash seen. No RUQ pain  - GC/Chlamydia probe amp ([place])not at [facility]  2. Elevated BP without diagnosis of hypertension Has had several high BPs in babyscripts but today her BP is normal although has lots of edema.  - Protein / creatinine ratio, urine - Comprehensive metabolic panel - CBC  3. Gestational edema in third trimester Advised compression knee highs.  Is swollen all the way to her knees.  Is not pitting as much as Provider would expect, but skin is very tight and toes seem like little sausages on her feet.  Term labor symptoms and general obstetric precautions including but not limited to vaginal bleeding, contractions, leaking of fluid and fetal movement were reviewed in detail with the patient. Please refer to After Visit Summary for other counseling recommendations.  Return in about 1 week (around 01/08/2020) for in person ROB.  Nolene Bernheim, RN, MSN, NP-BC Nurse Practitioner, [facility] for [employer], [facility] 01/01/2020 5:11 PM

## 2020-01-01 NOTE — Progress Notes (Signed)
Elevated pressures last night on BBRX  144/72 142/82 148/80

## 2020-01-02 LAB — COMPREHENSIVE METABOLIC PANEL
ALT: 15 IU/L (ref 0–32)
AST: 17 IU/L (ref 0–40)
Albumin/Globulin Ratio: 1.9 (ref 1.2–2.2)
Albumin: 3.7 g/dL — ABNORMAL LOW (ref 3.9–5.0)
Alkaline Phosphatase: 130 IU/L — ABNORMAL HIGH (ref 48–121)
BUN/Creatinine Ratio: 14 (ref 9–23)
BUN: 7 mg/dL (ref 6–20)
Bilirubin Total: <0.2 mg/dL (ref 0.0–1.2)
CO2: 21 mmol/L (ref 20–29)
Calcium: 10 mg/dL (ref 8.7–10.2)
Chloride: 105 mmol/L (ref 96–106)
Creatinine, Ser: 0.49 mg/dL — ABNORMAL LOW (ref 0.57–1.00)
GFR calc Af Amer: 160 mL/min/{1.73_m2} (ref >59)
GFR calc non Af Amer: 139 mL/min/{1.73_m2} (ref >59)
Globulin, Total: 1.9 g/dL (ref 1.5–4.5)
Glucose: 71 mg/dL (ref 65–99)
Potassium: 4.2 mmol/L (ref 3.5–5.2)
Sodium: 141 mmol/L (ref 134–144)
Total Protein: 5.6 g/dL — ABNORMAL LOW (ref 6.0–8.5)

## 2020-01-02 LAB — GC/CHLAMYDIA PROBE AMP (~~LOC~~) NOT AT ARMC
Chlamydia: NEGATIVE
Comment: NEGATIVE
Comment: NORMAL
Neisseria Gonorrhea: NEGATIVE

## 2020-01-02 LAB — PROTEIN / CREATININE RATIO, URINE
Creatinine, Urine: 59.1 mg/dL
Protein, Ur: 9.6 mg/dL
Protein/Creat Ratio: 162 mg/g creat (ref 0–200)

## 2020-01-02 LAB — CBC
Hematocrit: 41.6 % (ref 34.0–46.6)
Hemoglobin: 14 g/dL (ref 11.1–15.9)
MCH: 32.9 pg (ref 26.6–33.0)
MCHC: 33.7 g/dL (ref 31.5–35.7)
MCV: 98 fL — ABNORMAL HIGH (ref 79–97)
Platelets: 175 10*3/uL (ref 150–450)
RBC: 4.25 x10E6/uL (ref 3.77–5.28)
RDW: 12.4 % (ref 11.7–15.4)
WBC: 8 10*3/uL (ref 3.4–10.8)

## 2020-01-02 NOTE — Progress Notes (Signed)
   THERAPIST PROGRESS NOTE  Session Time: 11:00 am-11:45 am  Participation Level: Active  Behavioral Response: CasualAlertAnxious and Depressed  Type of Therapy: Individual Therapy  Treatment Goals addressed: Coping  Interventions: CBT and Solution Focused  Case Summary: Sharon Ward is a 24 y.o. female who presents oriented x5 (person, place, situation, time and object), casually dressed, appropriately groomed, average height, increased weight due to pregnancy, and cooperative to address mood and anxiety. Patient has a history of medical treatment including asthma and seasonal allergies. Patient has a history of mental health treatment including medication management, outpatient therapy, and treatment for eating disorder. Patient admits to passive SI but denies plan and means. Patient denies homicidal ideations. Patient denies psychosis including auditory and visual hallucinations. Patient denies substance abuse. Patient is at low risk for lethality.  Session #2  Physically: Patient's feet are still swollen. She has been more forgetful even prior to the pregnancy. Patient's sleep has been disrupted. She continues to feel bad about her pregnancy weight. She understood that her swelling and her weight will reduce when her baby is born.  Spiritually/values: No issues identified.  Relationships: Patient had an argument with her boyfriend but did something different. Normally, patient would shut down and not address the issue. Recently, after an argument she went back later to address the issue she was arguing about and they talked about it. It was hard but she is glad that she went back and did it.   Emotionally/Mentally/Behavior: Patient has been down. Patient noted that to manage her mood and thoughts she has been watching Once upon a time, listening to music, doing her make up, taking a warm bath, and drawing. She identified that she needs to keep a positive mindset about her body.    Patient engaged in session. She responded well to interventions. Patient continues to meet criteria for Bipolar 1, mixed and GAD. Patient will continue in outpatient therapy due to being the least restrictive service to meet her needs. Patient made minimal progress on her goals at this time.   Suicidal/Homicidal: Negativewithout intent/plan  Therapist Response: Therapist reviewed patient's recent thoughts and behaviors. Therapist utilized CBT to address mood and anxiety. Therapist processed patient's feelings to identify triggers for mood and anxiety. Therapist discussed patient's view of herself, and how she is taking care of herself to manage mood.   Plan: Return again in 1 weeks.  Diagnosis: Axis I: Bipolar, mixed and Generalized Anxiety Disorder    Axis II: No diagnosis    Bynum Bellows, LCSW 01/02/2020

## 2020-01-07 ENCOUNTER — Other Ambulatory Visit: Payer: Self-pay

## 2020-01-07 ENCOUNTER — Inpatient Hospital Stay (HOSPITAL_COMMUNITY)
Admission: AD | Admit: 2020-01-07 | Discharge: 2020-01-07 | Disposition: A | Discharge status: Home / Self Care | Payer: Medicaid Other | Attending: Obstetrics and Gynecology | Admitting: Obstetrics and Gynecology

## 2020-01-07 ENCOUNTER — Encounter (HOSPITAL_COMMUNITY): Payer: Self-pay | Admitting: Obstetrics and Gynecology

## 2020-01-07 DIAGNOSIS — O471 False labor at or after 37 completed weeks of gestation: Secondary | ICD-10-CM

## 2020-01-07 DIAGNOSIS — Z3A37 37 weeks gestation of pregnancy: Secondary | ICD-10-CM | POA: Diagnosis not present

## 2020-01-07 DIAGNOSIS — O479 False labor, unspecified: Secondary | ICD-10-CM

## 2020-01-07 NOTE — Discharge Instructions (Signed)
Labor and Vaginal Delivery  Vaginal delivery means that you give birth by pushing your baby out of your birth canal (vagina). A team of health care providers will help you before, during, and after vaginal delivery. Birth experiences are unique for every woman and every pregnancy, and birth experiences vary depending on where you choose to give birth. What happens when I arrive at the birth center or hospital? Once you are in labor and have been admitted into the hospital or birth center, your health care provider may:  Review your pregnancy history and any concerns that you have.  Insert an IV into one of your veins. This may be used to give you fluids and medicines.  Check your blood pressure, pulse, temperature, and heart rate (vital signs).  Check whether your bag of water (amniotic sac) has broken (ruptured).  Talk with you about your birth plan and discuss pain control options. Monitoring Your health care provider may monitor your contractions (uterine monitoring) and your baby's heart rate (fetal monitoring). You may need to be monitored:  Often, but not continuously (intermittently).  All the time or for long periods at a time (continuously). Continuous monitoring may be needed if: ? You are taking certain medicines, such as medicine to relieve pain or make your contractions stronger. ? You have pregnancy or labor complications. Monitoring may be done by:  Placing a special stethoscope or a handheld monitoring device on your abdomen to check your baby's heartbeat and to check for contractions.  Placing monitors on your abdomen (external monitors) to record your baby's heartbeat and the frequency and length of contractions.  Placing monitors inside your uterus through your vagina (internal monitors) to record your baby's heartbeat and the frequency, length, and strength of your contractions. Depending on the type of monitor, it may remain in your uterus or on your baby's head  until birth.  Telemetry. This is a type of continuous monitoring that can be done with external or internal monitors. Instead of having to stay in bed, you are able to move around during telemetry. Physical exam Your health care provider may perform frequent physical exams. This may include:  Checking how and where your baby is positioned in your uterus.  Checking your cervix to determine: ? Whether it is thinning out (effacing). ? Whether it is opening up (dilating). What happens during labor and delivery?  Normal labor and delivery is divided into the following three stages: Stage 1  This is the longest stage of labor.  This stage can last for hours or days.  Throughout this stage, you will feel contractions. Contractions generally feel mild, infrequent, and irregular at first. They get stronger, more frequent (about every 2-3 minutes), and more regular as you move through this stage.  This stage ends when your cervix is completely dilated to 4 inches (10 cm) and completely effaced. Stage 2  This stage starts once your cervix is completely effaced and dilated and lasts until the delivery of your baby.  This stage may last from 20 minutes to 2 hours.  This is the stage where you will feel an urge to push your baby out of your vagina.  You may feel stretching and burning pain, especially when the widest part of your baby's head passes through the vaginal opening (crowning).  Once your baby is delivered, the umbilical cord will be clamped and cut. This usually occurs after waiting a period of 1-2 minutes after delivery.  Your baby will be placed on your   bare chest (skin-to-skin contact) in an upright position and covered with a warm blanket. Watch your baby for feeding cues, like rooting or sucking, and help the baby to your breast for his or her first feeding. Stage 3  This stage starts immediately after the birth of your baby and ends after you deliver the placenta.  This  stage may take anywhere from 5 to 30 minutes.  After your baby has been delivered, you will feel contractions as your body expels the placenta and your uterus contracts to control bleeding. What can I expect after labor and delivery?  After labor is over, you and your baby will be monitored closely until you are ready to go home to ensure that you are both healthy. Your health care team will teach you how to care for yourself and your baby.  You and your baby will stay in the same room (rooming in) during your hospital stay. This will encourage early bonding and successful breastfeeding.  You may continue to receive fluids and medicines through an IV.  Your uterus will be checked and massaged regularly (fundal massage).  You will have some soreness and pain in your abdomen, vagina, and the area of skin between your vaginal opening and your anus (perineum).  If an incision was made near your vagina (episiotomy) or if you had some vaginal tearing during delivery, cold compresses may be placed on your episiotomy or your tear. This helps to reduce pain and swelling.  You may be given a squirt bottle to use instead of wiping when you go to the bathroom. To use the squirt bottle, follow these steps: ? Before you urinate, fill the squirt bottle with warm water. Do not use hot water. ? After you urinate, while you are sitting on the toilet, use the squirt bottle to rinse the area around your urethra and vaginal opening. This rinses away any urine and blood. ? Fill the squirt bottle with clean water every time you use the bathroom.  It is normal to have vaginal bleeding after delivery. Wear a sanitary pad for vaginal bleeding and discharge. Summary  Vaginal delivery means that you will give birth by pushing your baby out of your birth canal (vagina).  Your health care provider may monitor your contractions (uterine monitoring) and your baby's heart rate (fetal monitoring).  Your health care  provider may perform a physical exam.  Normal labor and delivery is divided into three stages.  After labor is over, you and your baby will be monitored closely until you are ready to go home. This information is not intended to replace advice given to you by your health care provider. Make sure you discuss any questions you have with your health care provider. Document Revised: 08/30/2017 Document Reviewed: 08/30/2017 Elsevier Patient Education  2020 Elsevier Inc[employer]  

## 2020-01-07 NOTE — MAU Provider Note (Signed)
S: Ms. Gagandeep Kossman is a 24 y.o. G1P0 at [redacted]w[redacted]d  who presents to MAU today for labor evaluation.     Cervical exam by RN:  Dilation: 4 Effacement (%): 70 Station: Ballotable Presentation: Vertex Exam by:: Manuela Neptune, RN Recheck unchanged after one hour  Fetal Monitoring: Baseline: 140 Variability: average Accelerations: present Decelerations: absent Contractions: irregular q 4-6 min  MDM Discussed patient with RN. NST reviewed.   A: SIUP at [redacted]w[redacted]d  False labor  P: Discharge home Labor precautions and kick counts included in AVS Patient to follow-up with office as scheduled  Patient may return to MAU as needed or when in labor   Aviva Signs, [place] 01/07/2020 3:42 AM

## 2020-01-07 NOTE — MAU Note (Signed)
Pt reports since 11pm she has had contractions every 10-15 minutes along with pelvic pressure. States that she had intercourse tonight and the pain started after that. Pt denies LOF or vaginal bleeding. Reports good fetal movement. Was 1cm earlier this week.

## 2020-01-07 NOTE — MAU Note (Signed)
I have communicated with Wynelle Bourgeois and reviewed vital signs:  Vitals:   01/07/20 0156 01/07/20 0353  BP: 133/83 138/70  Pulse: 97   Resp: 18   Temp: 98.3 F (36.8 C)   SpO2: 98%     Vaginal exam:  Dilation: 4 Effacement (%): 70 Station: Ballotable Presentation: Vertex Exam by:: Manuela Neptune, RN,   Also reviewed contraction pattern and that non-stress test is reactive.  It has been documented that patient is contracting Irregularly every 4-6 minutes with no cervical change over 1 hour not indicating active labor.  Patient denies any other complaints.  Based on this report provider has given order for discharge.  A discharge order and diagnosis entered by a provider.   Labor discharge instructions reviewed with patient.

## 2020-01-09 ENCOUNTER — Inpatient Hospital Stay (HOSPITAL_COMMUNITY)
Admission: AD | Admit: 2020-01-09 | Discharge: 2020-01-09 | Disposition: A | Discharge status: Home / Self Care | Payer: Medicaid Other | Attending: Obstetrics and Gynecology | Admitting: Obstetrics and Gynecology

## 2020-01-09 ENCOUNTER — Other Ambulatory Visit: Payer: Self-pay

## 2020-01-09 ENCOUNTER — Encounter (HOSPITAL_COMMUNITY): Payer: Self-pay | Admitting: Obstetrics and Gynecology

## 2020-01-09 ENCOUNTER — Ambulatory Visit: Payer: Self-pay

## 2020-01-09 DIAGNOSIS — Z3A37 37 weeks gestation of pregnancy: Secondary | ICD-10-CM | POA: Diagnosis not present

## 2020-01-09 DIAGNOSIS — O479 False labor, unspecified: Secondary | ICD-10-CM

## 2020-01-09 DIAGNOSIS — O471 False labor at or after 37 completed weeks of gestation: Secondary | ICD-10-CM

## 2020-01-09 NOTE — Telephone Encounter (Signed)
Pt. Reports she went to ED Monday and has dilated 4 cm. Having contractions at night usually 7-9 p.m. States baby has "dropped down." No contractions now. "I have a hard time reaching my doctor." Reports she has an appointment with OB tomorrow. Concerned she could be dilated more. Instructed to call OB today and inform them of her concerns. Verbalizes understanding. Instructed she can return to ED.  Answer Assessment - Initial Assessment Questions 1. ONSET: "When did the symptoms begin?"        Started Monday 2. CONTRACTIONS: "Describe the contractions that you are having." (e.g., duration, frequency, regularity, severity)     Comes and goes 3. EDD: "What date are you expecting to deliver?"     01/24/20 4. PARITY: "Have you had a baby before?" If yes, "How long did the labor last?"     First baby 5. FETAL MOVEMENT: "Has the baby's movement decreased or changed significantly from normal?"     Feels movement 6. OTHER SYMPTOMS: "Do you have any other symptoms?" (e.g., leaking fluid from vagina, vaginal bleeding, fever, hand/facial swelling)     Cramping comes and goes  Protocols used: PREGNANCY - LABOR-A-AH

## 2020-01-09 NOTE — MAU Note (Signed)
      S: Sharon Ward is a 24 y.o. G1P0 at [redacted]w[redacted]d  who presents to MAU today complaining intermittent contractions since Sunday. Wants to have cervical exam. Reports one contraction today. She denies vaginal bleeding. She denies LOF. She reports normal fetal movement.    O: BP 138/73   Pulse 70   Temp 98.2 F (36.8 C) (Oral)   Resp 20   Wt 101.4 kg   LMP 04/19/2019   SpO2 99%   BMI 39.59 kg/m    Cervical exam:  Dilation: 4 Effacement (%): 70 Cervical Position: Posterior Station: Ballotable Exam by:: Zenia Resides, RN   Fetal Monitoring: Baseline: 140 Variability: moderate Accelerations: + Decelerations: none  Contractions: Irritability    A: SIUP at [redacted]w[redacted]d  False labor, cervix unchanged from exam 5/31   P: Discharge home with labor precautions   Arvilla Market, DO 01/09/2020 2:42 PM

## 2020-01-09 NOTE — Discharge Instructions (Signed)
Call your OB Clinic or go to [facility] if: °· You begin to have strong, frequent contractions °· Your water breaks.  Sometimes it is a big gush of fluid, sometimes it is just a trickle that keeps getting your panties wet or running down your legs °· You have vaginal bleeding.  It is normal to have a small amount of spotting if your cervix was checked.  °· You don't feel your baby moving like normal.  If you don't, get you something to eat and drink and lay down and focus on feeling your baby move.  You should feel at least 10 movements in 2 hours.  If you don't, you should call the office or go to [facility].  ° °

## 2020-01-09 NOTE — MAU Note (Signed)
Pt reports intermittent ctx's since Sunday.   Pt desiring to have a cervical exam.     Denies vaginal bleeding or LOF.   Reports+FM

## 2020-01-10 ENCOUNTER — Ambulatory Visit (INDEPENDENT_AMBULATORY_CARE_PROVIDER_SITE_OTHER): Payer: Medicaid Other | Admitting: Obstetrics and Gynecology

## 2020-01-10 VITALS — BP 131/81 | HR 85 | Wt 222.9 lb

## 2020-01-10 DIAGNOSIS — Z3403 Encounter for supervision of normal first pregnancy, third trimester: Secondary | ICD-10-CM

## 2020-01-10 DIAGNOSIS — Z3A38 38 weeks gestation of pregnancy: Secondary | ICD-10-CM

## 2020-01-10 DIAGNOSIS — O99820 Streptococcus B carrier state complicating pregnancy: Secondary | ICD-10-CM

## 2020-01-10 DIAGNOSIS — B951 Streptococcus, group B, as the cause of diseases classified elsewhere: Secondary | ICD-10-CM

## 2020-01-10 NOTE — Progress Notes (Signed)
   PRENATAL VISIT NOTE  Subjective:  Sharon Ward is a 24 y.o. G1P0 at [redacted]w[redacted]d being seen today for ongoing prenatal care.  She is currently monitored for the following issues for this low-risk pregnancy and has Anxiety; Asthma; Bipolar disorder (HCC); Depression; LGSIL on Pap smear of cervix; Oppositional defiant disorder; H/O psychiatric hospitalization; Seasonal allergies; Encounter for supervision of normal first pregnancy in third trimester; Vaginal bleeding during pregnancy; Obesity affecting pregnancy; Late prenatal care affecting pregnancy in second trimester; Group beta Strep positive; and Excess weight gain in pregnancy on their problem list.  Patient reports contractions since 1 week.  Contractions: Irregular. Vag. Bleeding: None.  Movement: Present. Denies leaking of fluid.   The following portions of the patient's history were reviewed and updated as appropriate: allergies, current medications, past family history, past medical history, past social history, past surgical history and problem list.   Objective:   Vitals:   01/10/20 1602  BP: 131/81  Pulse: 85  Weight: 222 lb 14.4 oz (101.1 kg)    Fetal Status: Fetal Heart Rate (bpm): 139 Fundal Height: 40 cm Movement: Present  Presentation: Vertex  General:  Alert, oriented and cooperative. Patient is in no acute distress.  Skin: Skin is warm and dry. No rash noted.   Cardiovascular: Normal heart rate noted  Respiratory: Normal respiratory effort, no problems with respiration noted  Abdomen: Soft, gravid, appropriate for gestational age.  Pain/Pressure: Present     Pelvic: Cervical exam performed in the presence of a chaperone Dilation: 4.5 Effacement (%): 50 Station: Ballotable  Extremities: Normal range of motion.  Edema: Moderate pitting, indentation subsides rapidly  Mental Status: Normal mood and affect. Normal behavior. Normal judgment and thought content.   Assessment and Plan:  Pregnancy: G1P0 at [redacted]w[redacted]d  1.  Encounter for supervision of normal first pregnancy in third trimester  Doing well Reviewed labor precautions.   2. Group beta Strep positive  In urine, will treat in labor.    There are no diagnoses linked to this encounter. Term labor symptoms and general obstetric precautions including but not limited to vaginal bleeding, contractions, leaking of fluid and fetal movement were reviewed in detail with the patient. Please refer to After Visit Summary for other counseling recommendations.   Return in about 1 week (around 01/17/2020) for In person visit .  Future Appointments  Date Time Provider Department Center  01/21/2020 10:35 AM Malachy Chamber, MD [facility]  01/28/2020  2:55 PM Nugent, Odie Sera, NP [facility]  01/31/2020 10:00 AM Sheets, Ivin Booty, LCSW [facility] None  02/06/2020 10:30 AM [name], MD [facility] None  02/14/2020  2:00 PM Sheets, Ivin Booty, LCSW [facility] None    Venia Carbon, NP

## 2020-01-10 NOTE — Patient Instructions (Signed)
The [employer]  This circuit takes at least 90 minutes to complete so clear your schedule and make mental preparations so you can relax in your environment. The second step requires a lot of pillows so gather them up before beginning Before starting, you should empty your bladder! Have a nice drink nearby, and make sure it has a straw! If you are having contractions, this circuit should be done through contractions, try not to change positions between steps Before you begin...  "I named this 'circuit' after my friend Megan Miles, who shared and discussed it with me when I was working with a client whose labor seemed to be stalled out and no longer progressing... This circuit is useful to help get the baby lined up, ideally, in the "Left Occiput Anterior" (LOA) Position, both before labor begins and when some corrections need to be done during labor. Prenatally, this position set can help to rotate a baby. As a natural method of induction, this can help get things going if baby just needed a gentle nudge of position to set things off. To the best of my knowledge, this group of positions will not "hurt" a baby that is already lined up correctly." - Sharon Muza   Step One: Open-knee Chest Stay in this position for 30 minutes, start in cat/cow, then drop your chest as low as you can to the bed or the floor and your bottom as high as you can. Knees should be fairly wide apart, and the angle between the torso/thighs should be wider than 90 degrees. Wiggle around, prop with lots of pillows and use this time to get totally relaxed. This position allows the baby to scoot out of the pelvis a bit and gives them room to rotate, shift their head position, etc. If the pregnant person finds it helpful, careful positioning with a rebozo under the belly, with gentle tension from a support person behind can help maintain this position for the full 30 minutes.  Step Two:Exaggerated Left Side  Lying Roll to your left side, bringing your top leg as high as possible and keeping your bottom leg straight. Roll forward as much as possible, again using a lot of pillows. Sink into the bed and relax some more. If you fall asleep, that's totally okay and you can stay there! If not, stay here for at least another half an hour. Try and get your top right leg up towards your head and get as rolled over onto your belly as much as possible. If you repeat the circuit during labor, try alternating left and right sides. We know the photo the left is actually right side... just flip the image in your head.  Step Three: Moving and Lunges Lunge, walk stairs facing sideways, 2 at a time, (have a spotter downstairs of you!), take a walk outside with one foot on the curb and the other on the street, sit on a birth ball and hula- anything that's upright and putting your pelvis in open, asymmetrical positions. Spend at least 30 minutes doing this one as well to give your baby a chance to move down. If you are lunging or stair or curb walking, you should lunge/walk/go up stairs in the direction that feels better to you. The key with the lunge is that the toes of the higher leg and mom's belly button should be at right angles. Do not lunge over your knee, that closes the pelvis.     Megan Hamilton Miles: Circuit Creator - [email or web address] Sharon   Muza, CD, BDT (DONA), LCCE, FACCE: Supporting Content - [email or web address] Emily Weaver Brown: Photography - [email or web address] Kate Dewey CD/CDT (BAI): Print and Webmaster - [email or web address] [employer] [email or web address]  

## 2020-01-13 ENCOUNTER — Inpatient Hospital Stay (HOSPITAL_COMMUNITY)
Admission: AD | Admit: 2020-01-13 | Discharge: 2020-01-16 | DRG: 807 | Disposition: A | Discharge status: Home / Self Care | Payer: Medicaid Other | Attending: Obstetrics and Gynecology | Admitting: Obstetrics and Gynecology

## 2020-01-13 ENCOUNTER — Other Ambulatory Visit: Payer: Self-pay

## 2020-01-13 ENCOUNTER — Encounter (HOSPITAL_COMMUNITY): Payer: Self-pay | Admitting: Obstetrics and Gynecology

## 2020-01-13 DIAGNOSIS — Z3A38 38 weeks gestation of pregnancy: Secondary | ICD-10-CM

## 2020-01-13 DIAGNOSIS — O99824 Streptococcus B carrier state complicating childbirth: Secondary | ICD-10-CM | POA: Diagnosis present

## 2020-01-13 DIAGNOSIS — O139 Gestational [pregnancy-induced] hypertension without significant proteinuria, unspecified trimester: Secondary | ICD-10-CM

## 2020-01-13 DIAGNOSIS — F411 Generalized anxiety disorder: Secondary | ICD-10-CM | POA: Diagnosis present

## 2020-01-13 DIAGNOSIS — B951 Streptococcus, group B, as the cause of diseases classified elsewhere: Secondary | ICD-10-CM | POA: Diagnosis present

## 2020-01-13 DIAGNOSIS — O134 Gestational [pregnancy-induced] hypertension without significant proteinuria, complicating childbirth: Principal | ICD-10-CM | POA: Diagnosis present

## 2020-01-13 DIAGNOSIS — F329 Major depressive disorder, single episode, unspecified: Secondary | ICD-10-CM | POA: Diagnosis present

## 2020-01-13 DIAGNOSIS — O99344 Other mental disorders complicating childbirth: Secondary | ICD-10-CM | POA: Diagnosis present

## 2020-01-13 DIAGNOSIS — O99214 Obesity complicating childbirth: Secondary | ICD-10-CM | POA: Diagnosis present

## 2020-01-13 DIAGNOSIS — F32A Depression, unspecified: Secondary | ICD-10-CM | POA: Diagnosis present

## 2020-01-13 DIAGNOSIS — F319 Bipolar disorder, unspecified: Secondary | ICD-10-CM | POA: Diagnosis present

## 2020-01-13 DIAGNOSIS — Z20822 Contact with and (suspected) exposure to covid-19: Secondary | ICD-10-CM | POA: Diagnosis present

## 2020-01-13 DIAGNOSIS — E669 Obesity, unspecified: Secondary | ICD-10-CM | POA: Diagnosis present

## 2020-01-13 DIAGNOSIS — O133 Gestational [pregnancy-induced] hypertension without significant proteinuria, third trimester: Secondary | ICD-10-CM

## 2020-01-13 DIAGNOSIS — O0932 Supervision of pregnancy with insufficient antenatal care, second trimester: Secondary | ICD-10-CM

## 2020-01-13 DIAGNOSIS — F419 Anxiety disorder, unspecified: Secondary | ICD-10-CM | POA: Diagnosis present

## 2020-01-13 DIAGNOSIS — J45909 Unspecified asthma, uncomplicated: Secondary | ICD-10-CM | POA: Diagnosis present

## 2020-01-13 DIAGNOSIS — Z8659 Personal history of other mental and behavioral disorders: Secondary | ICD-10-CM

## 2020-01-13 DIAGNOSIS — O99210 Obesity complicating pregnancy, unspecified trimester: Secondary | ICD-10-CM | POA: Diagnosis present

## 2020-01-13 DIAGNOSIS — Z87891 Personal history of nicotine dependence: Secondary | ICD-10-CM

## 2020-01-13 DIAGNOSIS — F332 Major depressive disorder, recurrent severe without psychotic features: Secondary | ICD-10-CM | POA: Diagnosis present

## 2020-01-13 LAB — POCT FERN TEST: POCT Fern Test: POSITIVE

## 2020-01-13 MED ORDER — QUETIAPINE FUMARATE 25 MG PO TABS
50.0000 mg | ORAL_TABLET | Freq: Every day | ORAL | Status: DC
  Administered 2020-01-14 – 2020-01-15 (×3): 50 mg via ORAL
  Filled 2020-01-13: qty 1
  Filled 2020-01-13 (×2): qty 2
  Filled 2020-01-13: qty 1
  Filled 2020-01-13: qty 2

## 2020-01-13 MED ORDER — LACTATED RINGERS IV SOLN: INTRAVENOUS | Status: DC

## 2020-01-13 MED ORDER — SERTRALINE HCL 50 MG PO TABS
75.0000 mg | ORAL_TABLET | Freq: Every day | ORAL | Status: DC
  Filled 2020-01-13: qty 1

## 2020-01-13 NOTE — H&P (Signed)
OBSTETRIC ADMISSION HISTORY AND PHYSICAL  Amulya Quintin is a 24 y.o. female G1P0 with IUP at 61w3dby LMP presenting for SOL/SROM. Reports watery discharge since 2100. She reports +FMs, no VB, no blurry vision, headaches or peripheral edema, and RUQ pain.  She plans on breast feeding. She is undecided for birth control. She received her [facility] --->  Estimated Date of Delivery: 01/24/20  Sono:  5/3  @[redacted]w[redacted]d , CWD, normal anatomy, cephalic presentation, anterior placental lie, 2493g, 76% EFW  Prenatal History/Complications: Maternal Obesity GBS pos Hx of Depression/Anxiety/Bipolar on medication  Past Medical History: Past Medical History:  Diagnosis Date  . Anxiety   . Asthma   . Depression     Past Surgical History: Past Surgical History:  Procedure Laterality Date  . PILONIDAL CYST / SINUS EXCISION  09/11/2013  . PILONIDAL CYST EXCISION  05/17/2014   Pilonidal cystectomy with cleft lip    Obstetrical History: OB History    Gravida  1   Para  0   Term      Preterm      AB      Living        SAB      TAB      Ectopic      Multiple      Live Births              Social History: Social History   Socioeconomic History  . Marital status: Significant Other    Spouse name: Not on file  . Number of children: Not on file  . Years of education: Not on file  . Highest education level: Not on file  Occupational History  . Not on file  Tobacco Use  . Smoking status: Former SResearch scientist (life sciences) . Smokeless tobacco: Never Used  . Tobacco comment: only smoke a "couple" of cigarettes when stressed or anxious, socially with [facility]  Substance and Sexual Activity  . Alcohol use: Not Currently    Comment: occasional prior to pregnancy  . Drug use: Not Currently    Types: Marijuana    Comment: reports use 4 or 5 times; last used 05/04/19  . Sexual activity: Yes    Partners: Male  Other Topics Concern  . Not on file  Social  History Narrative  . Not on file   Social Determinants of Health   Financial Resource Strain:   . Difficulty of Paying Living Expenses:   Food Insecurity: No Food Insecurity  . Worried About RCharity fundraiserin the Last Year: Never true  . Ran Out of Food in the Last Year: Never true  Transportation Needs: No Transportation Needs  . Lack of Transportation (Medical): No  . Lack of Transportation (Non-Medical): No  Physical Activity:   . Days of Exercise per Week:   . Minutes of Exercise per Session:   Stress:   . Feeling of Stress :   Social Connections:   . Frequency of Communication with Friends and Family:   . Frequency of Social Gatherings with Friends and Family:   . Attends Religious Services:   . Active Member of Clubs or Organizations:   . Attends CArchivistMeetings:   .Marland KitchenMarital Status:     Family History: Family History  Problem Relation Age of Onset  . Healthy Mother   . Healthy Father     Allergies: Allergies  Allergen Reactions  . Citrus   . Coconut  Flavor   . Lamotrigine   . Orange (Diagnostic)   . Peach Flavor   . Pear   . Pineapple   . Ascorbate Rash    Medications Prior to Admission  Medication Sig Dispense Refill Last Dose  . acetaminophen (TYLENOL) 325 MG tablet Take 325 mg by mouth every 6 (six) hours as needed for mild pain or headache.   01/13/2020 at Unknown time  . docusate sodium (COLACE) 100 MG capsule Take 100 mg by mouth daily.    01/12/2020 at Unknown time  . ferrous sulfate 325 (65 FE) MG tablet Take 325 mg by mouth daily with breakfast.   01/12/2020 at Unknown time  . folic acid (FOLVITE) 976 MCG tablet Take 400 mcg by mouth daily.   01/12/2020 at Unknown time  . Prenatal Vit-Fe Fumarate-FA (MULTIVITAMIN-PRENATAL) 27-0.8 MG TABS tablet Take 1 tablet by mouth daily at 12 noon.   01/12/2020 at Unknown time  . QUEtiapine (SEROQUEL) 50 MG tablet Take 1 tablet (50 mg total) by mouth at bedtime. Delete the 173m prescription 30 tablet 0  01/12/2020 at Unknown time  . sertraline (ZOLOFT) 50 MG tablet Take 1.5 tablets (75 mg total) by mouth daily. Refill when due. Dose increased today 45 tablet 0 01/12/2020 at Unknown time  . Blood Pressure Monitoring (BLOOD PRESSURE KIT) DEVI 1 Device by Does not apply route as needed. 1 each 0   . Elastic Bandages & Supports (COMFORT FIT MATERNITY SUPP SM) MISC 1 Units by Does not apply route daily as needed. 1 each 0      Review of Systems   All systems reviewed and negative except as stated in HPI  Blood pressure 139/71, pulse 82, temperature 98.6 F (37 C), temperature source Oral, resp. rate 20, weight 100.4 kg, last menstrual period 04/19/2019, SpO2 99 %. General appearance: alert, cooperative and appears stated age Lungs: normal effort Heart: regular rate  Abdomen: soft, non-tender; bowel sounds normal Pelvic: gravid uterus Extremities: Homans sign is negative, no sign of DVT Presentation: cephalic by RN exam  Fetal monitoringBaseline: 145 bpm, Variability: Good {> 6 bpm), Accelerations: Reactive and Decelerations: Absent Uterine activity: Frequency: difficult to trace currently but previously q2-371milation: 4.5 Effacement (%): 70 Station: -1 Exam by:: MeAlvira PhilipsRN   Prenatal labs: ABO, Rh: --/--/O POS, O POS Performed [facility] [place] [id number]([phone]/06 2351) Antibody: NEG (06/06 2351) Rubella: Nonimmune (11/23 2108) RPR: Non Reactive (03/18 0849)  HBsAg: Negative (11/23 2108)  HIV: Non Reactive (03/18 0849)  GBS:   POS 2 hr Glucola WNL Genetic screening  Low risk Anatomy USKoreaormal  Prenatal Transfer Tool  Maternal Diabetes: No Genetic Screening: Normal Maternal Ultrasounds/Referrals: Normal Fetal Ultrasounds or other Referrals:  None Maternal Substance Abuse:  No Significant Maternal Medications: Zoloft, Seroquel Significant Maternal Lab Results: Group B Strep positive  Results for orders placed or performed during the  hospital encounter of 01/13/20 (from the past 24 hour(s))  POCT fern test   Collection Time: 01/13/20 11:39 PM  Result Value Ref Range   POCT Fern Test Positive = ruptured amniotic membanes   SARS Coronavirus 2 by RT PCR (hospital order, performed in [facility]) Nasopharyngeal Nasopharyngeal Swab   Collection Time: 01/13/20 11:40 PM   Specimen: Nasopharyngeal Swab  Result Value Ref Range   SARS Coronavirus 2 NEGATIVE NEGATIVE  CBC   Collection Time: 01/13/20 11:51 PM  Result Value Ref Range   WBC 12.7 (H)  4.0 - 10.5 K/uL   RBC 4.32 3.87 - 5.11 MIL/uL   Hemoglobin 14.4 12.0 - 15.0 g/dL   HCT 42.2 36.0 - 46.0 %   MCV 97.7 80.0 - 100.0 fL   MCH 33.3 26.0 - 34.0 pg   MCHC 34.1 30.0 - 36.0 g/dL   RDW 12.2 11.5 - 15.5 %   Platelets 171 150 - 400 K/uL   nRBC 0.0 0.0 - 0.2 %  Comprehensive metabolic panel   Collection Time: 01/13/20 11:51 PM  Result Value Ref Range   Sodium 138 135 - 145 mmol/L   Potassium 3.8 3.5 - 5.1 mmol/L   Chloride 107 98 - 111 mmol/L   CO2 20 (L) 22 - 32 mmol/L   Glucose, Bld 103 (H) 70 - 99 mg/dL   BUN 10 6 - 20 mg/dL   Creatinine, Ser 0.57 0.44 - 1.00 mg/dL   Calcium 9.6 8.9 - 10.3 mg/dL   Total Protein 6.6 6.5 - 8.1 g/dL   Albumin 3.2 (L) 3.5 - 5.0 g/dL   AST 26 15 - 41 U/L   ALT 19 0 - 44 U/L   Alkaline Phosphatase 149 (H) 38 - 126 U/L   Total Bilirubin 0.4 0.3 - 1.2 mg/dL   GFR calc non Af Amer >60 >60 mL/min   GFR calc Af Amer >60 >60 mL/min   Anion gap 11 5 - 15  Type and screen [place]   Collection Time: 01/13/20 11:51 PM  Result Value Ref Range   ABO/RH(D) O POS    Antibody Screen NEG    Sample Expiration      01/16/2020,2359 Performed at [facility], [address], [place], [place] [address]   ABO/Rh   Collection Time: 01/13/20 11:51 PM  Result Value Ref Range   ABO/RH(D)      O POS Performed at [facility] [address] [place], [place] [address]   Protein / creatinine ratio,  urine   Collection Time: 01/14/20 12:23 AM  Result Value Ref Range   Creatinine, Urine 79.36 mg/dL   Total Protein, Urine 16 mg/dL   Protein Creatinine Ratio 0.20 (H) 0.00 - 0.15 mg/mg[Cre]    Patient Active Problem List   Diagnosis Date Noted  . Labor and delivery, indication for care 01/14/2020  . Excess weight gain in pregnancy 10/25/2019  . Encounter for supervision of normal first pregnancy in third trimester 09/28/2019  . Vaginal bleeding during pregnancy 09/28/2019  . Obesity affecting pregnancy 09/28/2019  . Late prenatal care affecting pregnancy in second trimester 09/28/2019  . Anxiety 09/18/2019  . Asthma 09/18/2019  . Bipolar disorder ([place]) 09/18/2019  . Depression 09/18/2019  . LGSIL on Pap smear of cervix 09/18/2019  . Oppositional defiant disorder 09/18/2019  . H/O psychiatric hospitalization 09/18/2019  . Seasonal allergies 09/18/2019  . Group beta Strep positive 07/02/2019    Assessment/Plan:  Kelse Ploch is a 24 y.o. G1P0 at 33w3dhere for SOL/SROM.   #Labor: Vertex by RN exam. SROM 2100 on 6/6. Expectant management. Consider Pitocin if no change at 0700. Patient was interested in waterbirth but may not be now. Will have CNM consent if patient decides she is more interested when in active labor. Anticipate SVD. #Pain: Per patient request  #FWB: Cat I; EFW: 3400g #ID:  GBS pos; PCN #MOF: Breast #MOC: Undecided #Circ:  NA; gril #Depression/Anxiety/Bipolar: Cont PTA Seroquel and Zoloft. Assess mood post-partum.  #Elevated BP: had a couple elevated BP's about 2  hours apart in MAU, asymptomatic, CMP and Pr/Cr WNL; if has another now, would meet criteria for [place], MD [facility] Family Medicine Fellow, [facility] for [facility] [place] [facility] 01/14/2020, 3:00 AM

## 2020-01-13 NOTE — MAU Note (Signed)
Patient reports CTX every 1-2 minutes since 2100.  Some watery discharge mixed with mucous since around then.  States she hasn't felt baby move since around 1400 (FHR= 152).  Denies VB.

## 2020-01-14 ENCOUNTER — Encounter (HOSPITAL_COMMUNITY): Payer: Self-pay | Admitting: Obstetrics and Gynecology

## 2020-01-14 DIAGNOSIS — F419 Anxiety disorder, unspecified: Secondary | ICD-10-CM | POA: Diagnosis present

## 2020-01-14 DIAGNOSIS — O4202 Full-term premature rupture of membranes, onset of labor within 24 hours of rupture: Secondary | ICD-10-CM | POA: Diagnosis not present

## 2020-01-14 DIAGNOSIS — Z3A38 38 weeks gestation of pregnancy: Secondary | ICD-10-CM

## 2020-01-14 DIAGNOSIS — O99214 Obesity complicating childbirth: Secondary | ICD-10-CM | POA: Diagnosis present

## 2020-01-14 DIAGNOSIS — O99344 Other mental disorders complicating childbirth: Secondary | ICD-10-CM | POA: Diagnosis present

## 2020-01-14 DIAGNOSIS — F319 Bipolar disorder, unspecified: Secondary | ICD-10-CM | POA: Diagnosis present

## 2020-01-14 DIAGNOSIS — O99824 Streptococcus B carrier state complicating childbirth: Secondary | ICD-10-CM | POA: Diagnosis present

## 2020-01-14 DIAGNOSIS — O26893 Other specified pregnancy related conditions, third trimester: Secondary | ICD-10-CM | POA: Diagnosis present

## 2020-01-14 DIAGNOSIS — O139 Gestational [pregnancy-induced] hypertension without significant proteinuria, unspecified trimester: Secondary | ICD-10-CM

## 2020-01-14 DIAGNOSIS — E669 Obesity, unspecified: Secondary | ICD-10-CM | POA: Diagnosis present

## 2020-01-14 DIAGNOSIS — Z87891 Personal history of nicotine dependence: Secondary | ICD-10-CM | POA: Diagnosis not present

## 2020-01-14 DIAGNOSIS — Z20822 Contact with and (suspected) exposure to covid-19: Secondary | ICD-10-CM | POA: Diagnosis present

## 2020-01-14 DIAGNOSIS — O134 Gestational [pregnancy-induced] hypertension without significant proteinuria, complicating childbirth: Secondary | ICD-10-CM | POA: Diagnosis present

## 2020-01-14 LAB — COMPREHENSIVE METABOLIC PANEL
ALT: 19 U/L (ref 0–44)
AST: 26 U/L (ref 15–41)
Albumin: 3.2 g/dL — ABNORMAL LOW (ref 3.5–5.0)
Alkaline Phosphatase: 149 U/L — ABNORMAL HIGH (ref 38–126)
Anion gap: 11 (ref 5–15)
BUN: 10 mg/dL (ref 6–20)
CO2: 20 mmol/L — ABNORMAL LOW (ref 22–32)
Calcium: 9.6 mg/dL (ref 8.9–10.3)
Chloride: 107 mmol/L (ref 98–111)
Creatinine, Ser: 0.57 mg/dL (ref 0.44–1.00)
GFR calc Af Amer: >60 mL/min (ref >60)
GFR calc non Af Amer: >60 mL/min (ref >60)
Glucose, Bld: 103 mg/dL — ABNORMAL HIGH (ref 70–99)
Potassium: 3.8 mmol/L (ref 3.5–5.1)
Sodium: 138 mmol/L (ref 135–145)
Total Bilirubin: 0.4 mg/dL (ref 0.3–1.2)
Total Protein: 6.6 g/dL (ref 6.5–8.1)

## 2020-01-14 LAB — CBC
HCT: 42.2 % (ref 36.0–46.0)
Hemoglobin: 14.4 g/dL (ref 12.0–15.0)
MCH: 33.3 pg (ref 26.0–34.0)
MCHC: 34.1 g/dL (ref 30.0–36.0)
MCV: 97.7 fL (ref 80.0–100.0)
Platelets: 171 10*3/uL (ref 150–400)
RBC: 4.32 MIL/uL (ref 3.87–5.11)
RDW: 12.2 % (ref 11.5–15.5)
WBC: 12.7 10*3/uL — ABNORMAL HIGH (ref 4.0–10.5)
nRBC: 0 % (ref 0.0–0.2)

## 2020-01-14 LAB — TYPE AND SCREEN
ABO/RH(D): O POS
Antibody Screen: NEGATIVE

## 2020-01-14 LAB — RPR: RPR Ser Ql: NONREACTIVE

## 2020-01-14 LAB — ABO/RH: ABO/RH(D): O POS

## 2020-01-14 LAB — SARS CORONAVIRUS 2 BY RT PCR (HOSPITAL ORDER, PERFORMED IN ~~LOC~~ HOSPITAL LAB): SARS Coronavirus 2: NEGATIVE

## 2020-01-14 LAB — PROTEIN / CREATININE RATIO, URINE
Creatinine, Urine: 79.36 mg/dL
Protein Creatinine Ratio: 0.2 mg/mg{Cre} — ABNORMAL HIGH (ref 0.00–0.15)
Total Protein, Urine: 16 mg/dL

## 2020-01-14 MED ORDER — EPHEDRINE 5 MG/ML INJ: 10.0000 mg | INTRAVENOUS | Status: DC | PRN

## 2020-01-14 MED ORDER — BENZOCAINE-MENTHOL 20-0.5 % EX AERO
1.0000 "application " | INHALATION_SPRAY | CUTANEOUS | Status: DC | PRN
  Administered 2020-01-14: 1 via TOPICAL
  Filled 2020-01-14 (×2): qty 56

## 2020-01-14 MED ORDER — ACETAMINOPHEN 325 MG PO TABS: 650.0000 mg | ORAL_TABLET | ORAL | Status: DC | PRN

## 2020-01-14 MED ORDER — OXYTOCIN-SODIUM CHLORIDE 30-0.9 UT/500ML-% IV SOLN
2.5000 [IU]/h | INTRAVENOUS | Status: DC
  Filled 2020-01-14: qty 1000

## 2020-01-14 MED ORDER — TETANUS-DIPHTH-ACELL PERTUSSIS 5-2.5-18.5 LF-MCG/0.5 IM SUSP: 0.5000 mL | Freq: Once | INTRAMUSCULAR | Status: DC

## 2020-01-14 MED ORDER — FENTANYL-BUPIVACAINE-NACL 0.5-0.125-0.9 MG/250ML-% EP SOLN: 12.0000 mL/h | EPIDURAL | Status: DC | PRN

## 2020-01-14 MED ORDER — LACTATED RINGERS IV SOLN: 500.0000 mL | Freq: Once | INTRAVENOUS | Status: DC

## 2020-01-14 MED ORDER — PHENYLEPHRINE 40 MCG/ML (10ML) SYRINGE FOR IV PUSH (FOR BLOOD PRESSURE SUPPORT): 80.0000 ug | PREFILLED_SYRINGE | INTRAVENOUS | Status: DC | PRN

## 2020-01-14 MED ORDER — COCONUT OIL OIL: 1.0000 "application " | TOPICAL_OIL | Status: DC | PRN

## 2020-01-14 MED ORDER — WITCH HAZEL-GLYCERIN EX PADS: 1.0000 "application " | MEDICATED_PAD | CUTANEOUS | Status: DC | PRN

## 2020-01-14 MED ORDER — LACTATED RINGERS IV SOLN: 500.0000 mL | INTRAVENOUS | Status: DC | PRN

## 2020-01-14 MED ORDER — MEASLES, MUMPS & RUBELLA VAC IJ SOLR
0.5000 mL | Freq: Once | INTRAMUSCULAR | Status: AC
  Administered 2020-01-16: 0.5 mL via SUBCUTANEOUS
  Filled 2020-01-14: qty 0.5

## 2020-01-14 MED ORDER — PRENATAL MULTIVITAMIN CH
1.0000 | ORAL_TABLET | Freq: Every day | ORAL | Status: DC
  Administered 2020-01-14 – 2020-01-15 (×2): 1 via ORAL
  Filled 2020-01-14 (×2): qty 1

## 2020-01-14 MED ORDER — ONDANSETRON HCL 4 MG/2ML IJ SOLN
4.0000 mg | Freq: Four times a day (QID) | INTRAMUSCULAR | Status: DC | PRN
  Administered 2020-01-14: 4 mg via INTRAVENOUS
  Filled 2020-01-14: qty 2

## 2020-01-14 MED ORDER — DIPHENHYDRAMINE HCL 25 MG PO CAPS: 25.0000 mg | ORAL_CAPSULE | Freq: Four times a day (QID) | ORAL | Status: DC | PRN

## 2020-01-14 MED ORDER — FENTANYL CITRATE (PF) 100 MCG/2ML IJ SOLN
100.0000 ug | INTRAMUSCULAR | Status: DC | PRN
  Administered 2020-01-14: 100 ug via INTRAVENOUS
  Filled 2020-01-14: qty 2

## 2020-01-14 MED ORDER — SIMETHICONE 80 MG PO CHEW: 80.0000 mg | CHEWABLE_TABLET | ORAL | Status: DC | PRN

## 2020-01-14 MED ORDER — DIPHENHYDRAMINE HCL 50 MG/ML IJ SOLN: 12.5000 mg | INTRAMUSCULAR | Status: DC | PRN

## 2020-01-14 MED ORDER — SENNOSIDES-DOCUSATE SODIUM 8.6-50 MG PO TABS
2.0000 | ORAL_TABLET | ORAL | Status: DC
  Administered 2020-01-14 – 2020-01-15 (×2): 2 via ORAL
  Filled 2020-01-14 (×2): qty 2

## 2020-01-14 MED ORDER — ONDANSETRON HCL 4 MG/2ML IJ SOLN: 4.0000 mg | INTRAMUSCULAR | Status: DC | PRN

## 2020-01-14 MED ORDER — ACETAMINOPHEN 325 MG PO TABS
650.0000 mg | ORAL_TABLET | Freq: Four times a day (QID) | ORAL | Status: DC | PRN
  Administered 2020-01-15: 650 mg via ORAL
  Filled 2020-01-14: qty 2

## 2020-01-14 MED ORDER — IBUPROFEN 600 MG PO TABS
600.0000 mg | ORAL_TABLET | Freq: Three times a day (TID) | ORAL | Status: DC | PRN
  Administered 2020-01-14 – 2020-01-16 (×5): 600 mg via ORAL
  Filled 2020-01-14 (×5): qty 1

## 2020-01-14 MED ORDER — PENICILLIN G POT IN DEXTROSE 60000 UNIT/ML IV SOLN: 3.0000 10*6.[IU] | INTRAVENOUS | Status: DC

## 2020-01-14 MED ORDER — SODIUM CHLORIDE 0.9 % IV SOLN
5.0000 10*6.[IU] | Freq: Once | INTRAVENOUS | Status: AC
  Administered 2020-01-14: 5 10*6.[IU] via INTRAVENOUS
  Filled 2020-01-14: qty 5

## 2020-01-14 MED ORDER — SOD CITRATE-CITRIC ACID 500-334 MG/5ML PO SOLN: 30.0000 mL | ORAL | Status: DC | PRN

## 2020-01-14 MED ORDER — SERTRALINE HCL 50 MG PO TABS
75.0000 mg | ORAL_TABLET | Freq: Every day | ORAL | Status: DC
  Administered 2020-01-14 – 2020-01-15 (×2): 75 mg via ORAL
  Filled 2020-01-14 (×4): qty 1

## 2020-01-14 MED ORDER — ONDANSETRON HCL 4 MG PO TABS: 4.0000 mg | ORAL_TABLET | ORAL | Status: DC | PRN

## 2020-01-14 MED ORDER — FENTANYL-BUPIVACAINE-NACL 0.5-0.125-0.9 MG/250ML-% EP SOLN
EPIDURAL | Status: AC
  Filled 2020-01-14: qty 250

## 2020-01-14 MED ORDER — LIDOCAINE HCL (PF) 1 % IJ SOLN: 30.0000 mL | INTRAMUSCULAR | Status: DC | PRN

## 2020-01-14 MED ORDER — DIBUCAINE (PERIANAL) 1 % EX OINT: 1.0000 "application " | TOPICAL_OINTMENT | CUTANEOUS | Status: DC | PRN

## 2020-01-14 MED ORDER — OXYTOCIN BOLUS FROM INFUSION
500.0000 mL | Freq: Once | INTRAVENOUS | Status: AC
  Administered 2020-01-14: 500 mL via INTRAVENOUS

## 2020-01-14 NOTE — Lactation Note (Signed)
This note was copied from a baby's chart. Lactation Consultation Note  [name] Today's Date: 01/14/2020 Reason for consult: Initial assessment;Early term 37-38.6wks;Primapara;1st time breastfeeding  P1 mother whose infant is now 59 hours old.  This is an ETI at 38+4 weeks.  Baby was swaddled and asleep in the bassinet when I arrived.  Spent considerable amount of time with basic breast feeding education.  Mother had many questions/concerns.  Taught hand expression but she was unable to express any colostrum drops at this time.  Container provided and milk storage times reviewed.  Finger feeding demonstrated.  RN in room for assessment and baby awakened.  Offered to assist with latching and mother agreeable.  Showed mother how to properly position herself in bed with good pillow support.  Assisted baby to latch in the football hold on the left breast easily.  Once at the breast she required gentle stimulation to begin sucking and intermittent stimulation to continue sucking.  Mother denied pain with latching.  Observed baby feeding for 13 minutes while continuing to educate mother.  Mother receptive to all teaching.  Instructed her on how to get father involved with assisting with breast feeding.  Father remained asleep during the duration of my visit.  Encouraged mother to feed 8-12 times/24 hours or sooner if baby desires.  She will awaken her every three hours to feed if she remains sleepy.  Suggested hand expression before/after feeding.    Mother does not have a DEBP for home use.  She is a [facility] participant but will most likely be choosing the formula package.  I suggested she call this afternoon to get all her questions answered.  Mother will follow through with this.    Continue to reinforce concepts taught and provide positive reinforcement for mother.  Praised her for a successful feeding.  RN updated.   Maternal Data Formula Feeding for Exclusion: Yes Reason for  exclusion: Mother's choice to formula and breast feed on admission Has patient been taught Hand Expression?: Yes Does the patient have breastfeeding experience prior to this delivery?: No  Feeding Feeding Type: Breast Fed  LATCH Score Latch: Repeated attempts needed to sustain latch, nipple held in mouth throughout feeding, stimulation needed to elicit sucking reflex.  Audible Swallowing: None  Type of Nipple: Everted at rest and after stimulation(short shafted)  Comfort (Breast/Nipple): Soft / non-tender  Hold (Positioning): Assistance needed to correctly position infant at breast and maintain latch.  LATCH Score: 6  Interventions Interventions: Breast feeding basics reviewed;Assisted with latch;Skin to skin;Breast massage;Hand express;Breast compression;Adjust position;Position options;Support pillows  Lactation Tools Discussed/Used WIC Program: Yes   Consult Status Consult Status: Follow-up Date: 01/15/20 Follow-up type: In-patient    Dora Sims 01/14/2020, 3:36 PM

## 2020-01-14 NOTE — Discharge Summary (Addendum)
Postpartum Discharge Summary    [name] DOB: Dec 14, 1995 MRN: [id number]  Date of admission: 01/13/2020 Delivery date:01/14/2020  Delivering provider: Chauncey Mann  Date of discharge: 01/16/2020  Admitting diagnosis: Labor and delivery, indication for care [O75.9] Intrauterine pregnancy: [redacted]w[redacted]d    Secondary diagnosis:  Active Problems:   Anxiety   Asthma   B[place] disorder ([place]   Depression   H/O psychiatric hospitalization   Obesity affecting pregnancy   Late prenatal care affecting pregnancy in second trimester   Group beta Strep positive   Labor and delivery, indication for care   Gestational hypertension  Additional problems: None    Discharge diagnosis: Term Pregnancy Delivered                                              Post partum procedures: None Augmentation:  None Complications: None  Hospital course: Onset of Labor With Vaginal Delivery      24y.o. yo G1P0 at 319w4das admitted in Latent Labor on 01/13/2020. Patient had an uncomplicated labor course as follows: Arrived with SROM at home. Initial SVE: 4/70/-3. She then progressed to complete with uncomplicated delivery. Membrane Rupture Time/Date: 9:00 PM ,01/13/2020   Delivery Method:Vaginal, Spontaneous  Episiotomy: None  Lacerations:  None  Patient had an uncomplicated postpartum course. Patient diagnosed with gHTN intrapartum. BP's monitored and were within normal range for majority of admission. She is ambulating, tolerating a regular diet, passing flatus, and urinating well. Patient is discharged home in stable condition on 01/16/20.  Newborn Data: Birth date:01/14/2020  Birth time:4:27 AM  Gender:Female  Living status:Living  Apgars:8 ,9  Weight:3351 g   Magnesium Sulfate received: No BMZ received: No Rhophylac:No MMR:Yes ordered for prior to discharge  T-DaP:Given prenatally Flu: No Transfusion:No  Physical exam  Vitals:   01/15/20 0532 01/15/20 1332 01/15/20 2158 01/16/20  0604  BP: 106/62 131/85 133/82 126/74  Pulse: 82 76 83 96  Resp: _0 Temp: 97.9 F (36.6 C) 97.8 F (36.6 C) 98.1 F (36.7 C) 98 F (36.7 C)  TempSrc: Oral Oral Oral Oral  SpO2: 96% 99% 100%   Weight:       General: alert, cooperative and no distress Lochia: appropriate Uterine Fundus: firm Incision: N/A DVT Evaluation: No evidence of DVT seen on physical exam. Negative Homan's sign. No cords or calf tenderness. Labs: Lab Results  Component Value Date   WBC 12.7 (H) 01/13/2020   HGB 14.4 01/13/2020   HCT 42.2 01/13/2020   MCV 97.7 01/13/2020   PLT 171 01/13/2020   CMP Latest Ref Rng & Units 01/13/2020  Glucose 70 - 99 mg/dL 103(H)  BUN 6 - 20 mg/dL 10  Creatinine 0.44 - 1.00 mg/dL 0.57  Sodium 135 - 145 mmol/L 138  Potassium 3.5 - 5.1 mmol/L 3.8  Chloride 98 - 111 mmol/L 107  CO2 22 - 32 mmol/L 20(L)  Calcium 8.9 - 10.3 mg/dL 9.6  Total Protein 6.5 - 8.1 g/dL 6.6  Total Bilirubin 0.3 - 1.2 mg/dL 0.4  Alkaline Phos 38 - 126 U/L 149(H)  AST 15 - 41 U/L 26  ALT 0 - 44 U/L 19   Edinburgh Score: Edinburgh Postnatal Depression Scale Screening Tool 01/15/2020  I have been able to laugh and see the funny side of things. (No Data)  After visit meds:  Allergies as of 01/16/2020       Reactions   Citrus    Coconut Flavor    Lamotrigine    Orange (diagnostic)    Peach Flavor    Pear    Pineapple    Ascorbate Rash        Medication List     TAKE these medications    acetaminophen 325 MG tablet Commonly known as: Tylenol Take 2 tablets (650 mg total) by mouth every 6 (six) hours as needed (for pain scale < 4). What changed:  how much to take reasons to take this   Blood Pressure Kit Devi 1 Device by Does not apply route as needed.   Comfort Fit Maternity Supp Sm Misc 1 Units by Does not apply route daily as needed.   docusate sodium 100 MG capsule Commonly known as: COLACE Take 100 mg by mouth daily.   ferrous sulfate 325 (65 FE) MG  tablet Take 325 mg by mouth daily with breakfast.   folic acid 940 MCG tablet Commonly known as: FOLVITE Take 400 mcg by mouth daily.   ibuprofen 600 MG tablet Commonly known as: ADVIL Take 1 tablet (600 mg total) by mouth every 8 (eight) hours as needed for mild pain.   multivitamin-prenatal 27-0.8 MG Tabs tablet Take 1 tablet by mouth daily at 12 noon.   QUEtiapine 50 MG tablet Commonly known as: SEROQUEL Take 1 tablet (50 mg total) by mouth at bedtime. Delete the 132m prescription   sertraline 50 MG tablet Commonly known as: ZOLOFT Take 1.5 tablets (75 mg total) by mouth daily. Refill when due. Dose increased today         Discharge home in stable condition Infant Feeding: Breast Infant Disposition:home with mother Discharge instruction: per After Visit Summary and Postpartum booklet. Activity: Advance as tolerated. Pelvic rest for 6 weeks.  Diet: routine diet Future Appointments: Future Appointments  Date Time Provider [place] 01/28/2020  1:45 PM [facility] 01/31/2020 10:00 AM Sheets, JVonna Kotyk LCSW [facility] None  02/06/2020 10:30 AM AMerian Capron MD [facility] None  02/12/2020  1:15 PM [facility] 02/14/2020  2:00 PM Sheets, JVonna Kotyk LCSW [facility] None   Follow up Visit:    Please schedule this patient for a Virtual postpartum visit in 4 weeks with the following provider: Any provider. Additional Postpartum F/U:Postpartum Depression checkup  Low risk pregnancy complicated by:  none Delivery mode:  Vaginal, Spontaneous  Anticipated Birth Control:  Unsure   01/16/2020 MStark Klein MD  GME ATTESTATION:  I saw and evaluated the patient. I agree with the findings and the plan of care as documented in the resident's note.  HMerilyn Baba DO OB Fellow, F[place] Cityfor W[place]6/04/2020 8:13 PM

## 2020-01-15 NOTE — BH Specialist Note (Signed)
Integrated Behavioral Health via Telemedicine Video Visit  01/15/2020 Sharon Ward [id number]  Number of Integrated Behavioral Health visits: 3 Session Start time: 1:50  Session End time: 2:18 Total time: 28  Referring Provider: Jaynie Collins, MD Type of Visit: Video Patient/Family location: Home [facility] Provider location: Center for [facility]  All persons participating in visit: Patient Sharon Ward and Sharon Ward Sharon Ward    Confirmed patient's address: Yes  Confirmed patient's phone number: Yes  Any changes to demographics: No   Confirmed patient's insurance: Yes  Any changes to patient's insurance: No   Discussed confidentiality: at previous visit  I connected with Sharon Ward  by a video enabled telemedicine application ([employer]) and verified that I am speaking with the correct person using two identifiers.     I discussed the limitations of evaluation and management by telemedicine and the availability of in person appointments.  I discussed that the purpose of this visit is to provide behavioral health care while limiting exposure to the novel coronavirus.   Discussed there is a possibility of technology failure and discussed alternative modes of communication if that failure occurs.  I discussed that engaging in this video visit, they consent to the provision of behavioral healthcare and the services will be billed under their insurance.  Patient and/or legal guardian expressed understanding and consented to video visit: Yes   PRESENTING CONCERNS: Patient and/or family reports the following symptoms/concerns: Pt states her primary concern today is feeling an increase in depression, adjusting to new motherhood and postpartum body; plans to continue attending ongoing therapy and psychiatry appointments outpatient.  Duration of problem: Ongoing, with increase postpartum; Severity of problem: moderate  STRENGTHS (Protective  Factors/Coping Skills): Adhering to treatment via psychiatry, ongoing therapy and BH medication; supportive boyfriend and father; good self-awareness and open to learning new skills  GOALS ADDRESSED: Patient will: 1.  Reduce symptoms of: anxiety and depression  2.  Demonstrate ability to: Increase healthy adjustment to current life circumstances and Increase adequate support systems for patient/family  INTERVENTIONS: Interventions utilized:  Supportive Counseling and Link to [facility] Standardized Assessments completed: Not Needed  ASSESSMENT: Patient currently experiencing Bipolar 1 disorder (most recent episode mixed)and Generalized anxiety disorder (as previously diagnosed via psychiatry).   Patient may benefit from continued psychoeducation and brief therapeutic interventions regarding coping with symptoms of anxiety and depression .  PLAN: 1. Follow up with behavioral health clinician on : Call Sharon Ward at [phone] if needed 2. Behavioral recommendations:  -Continue attending psychiatry and ongoing therapy appointments -Continue taking medications prescribed by psychiatry -Consider registering for and attending new mom online support groups at either [email or web address] or [email or web address].net for additional support  3. Referral(s): Integrated Art gallery manager (In Clinic) and [employer]:  New mom support  I discussed the assessment and treatment plan with the patient and/or parent/guardian. They were provided an opportunity to ask questions and all were answered. They agreed with the plan and demonstrated an understanding of the instructions.   They were advised to call back or seek an in-person evaluation if the symptoms worsen or if the condition fails to improve as anticipated.  Sharon Ward

## 2020-01-15 NOTE — Clinical Social Work Maternal (Signed)
CLINICAL SOCIAL WORK MATERNAL/CHILD NOTE  Patient Details  Name: Sharon Ward MRN: [id number] Date of Birth: 10/29/1995  Date:  01/15/2020  Clinical Social Worker Initiating Note:  Durward Fortes, LCSW Date/Time: Initiated:  01/15/20/1000     Child's Name:  Sharon Ward   Biological Parents:  Mother, Father(Sharon Ward, Sharon Ward)   Need for Interpreter:  None   Reason for Referral:  Behavioral Health Concerns   Address:  [address]  [address] [place] [address]    Phone number:  [phone] (home)     Additional phone number: none   Household Members/Support Persons (HM/SP):   Household Member/Support Person 1   HM/SP Name Relationship DOB or Age  HM/SP -Sharon Ward     HM/SP -2   Sharon Ward  FOB     HM/SP -3        HM/SP -4        HM/SP -5        HM/SP -6        HM/SP -7        HM/SP -8          Natural Supports (not living in the home):      Professional Supports: Therapist, Other (Comment)(Ward reported that she has a Forensic scientist.)   Employment: Unemployed   Type of Work: none   Education:  9 to 11 years   Homebound arranged: No  Financial Resources:  Medicaid   Other Resources:  Physicist, medical , [facility]   Cultural/Religious Considerations Which May Impact Care:  none reported to this CSW.   Strengths:  Compliance with medical plan , Psychotropic Medications, Home prepared for child , Pediatrician chosen, Ability to meet basic needs    Psychotropic Medications:  Zoloft, Seroquel      Pediatrician:    [place] area  Pediatrician List:   [facility] for [place]      Pediatrician Fax Number:    Risk Factors/Current Problems:  Mental Health Concerns    Cognitive State:  Alert    Mood/Affect:  Tearful , Fearful , Interested , Depressed , Anxious    CSW Assessment: CSW consulted as Ward has a hx of  depression, anxiety, ODD, Bipolar, and reported SI in February 2021. CSW went to speak with Ward at bedside to address further needs.   CSW entered the room and congratulated Ward on the birth of infant. CSW advised Ward of HIPPA policy as CSW noted that when CSW entered room first time, FOB Was in room. CSW was updated by Ward that it was fine for CSW to speak with Ward while FOB was in room. CSW understanding and advised Ward of CSWs' role and the reason for CSW coming to visit with her. Ward reported that she was originally diagnosed with Bipolar but the diagnosis was later changed to Borderline Personality Disorder. Ward reported that she currently takes Seroquel and Zoloft. Ward reports that she took this medication during her pregnancy as "I was told that this was safer for me during pregnancy as opposed to the meds that I was on previously". CSW understanding and inquired from Ward on if Seroquel and Zoloft were helping her and Ward reported that they are. As CSW spoke with Ward CSW noted that Ward was trying to feed however infant began to become very fussy. CSW allowed Ward and FOB  to console infant in which both had challenges in doing so. CSW offered to get RN to assist with latching in which Ward was agreeable. CSW then noted that Ward was becoming more upset and crying as infant still cried and was inconsolable. CSW offered to return at a later time as CSW noted that Ward was very stressed.   CSW returned to room later in the afternoon to finish assessing Ward. 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 Ward reported that she has dealt with mental health and eating disorder since the age of 87-12. Ward reports admissions to clinics but still reports that she has trouble with eating at time. Ward reported that she ddint have any trouble eating during pregnancy as "I knew that I needed to eat for her". Ward reported that she sometimes has no desire to eat. CSW offered Ward Eating Disorder  resources in which Ward was agreeable to. CSW redirected conversation back to Ward's mental health. Ward reported that she was and is still seeing two therapist Sharon Ward with [facility] and Sharon Bellows, LCSW) and a Psychiatrist Sharon Ward) for her mental health. Ward reports that each one has been very beneficial in her mental health journey and reports that she has an appointment with Sharon Ward coming up soon. Ward expressed to CSW that she recently felt SI about three weeks ago. CSW inquired more information from Ward on this. Ward reported that back in February she also felt SI and her therapist's helped her through these. Ward reported "they helped me process why I was feeling the way that I was". CSW understanding and asked Ward is she felt SI and HI now and Ward denied multiple times that she wasn't. CSW understanding and further proceeded with assessing Ward. Ward reported that she has been feeling "a little depressed and anxious since having her because I am a new mom and I feel that I am hurting her". CSW validated feelings of fear but also assisted Ward in processing feelings of depression as CSW advised Ward that these can be signs and symptoms that relate to PPD. Ward reported "I just get depressed at time. Its like Im happy to have her but I am really afraid that I am doing something wrong". CSW assured Ward that she was doing a good job from [employer] observation and encouraged Ward to ask questions to staff as they relate to infant. Ward reported that she understood. CSW also strongly urged Ward to follow up with therapist as needed to ensure that PPD isnt occurring in Ward. Ward reported that she understood and expressed feeling "like emotions are all over the place". CSW reported to [facility] that his can be common after having infant but again advised Ward to be sure to keep track of feelings with the PPD Checklist that was given to Ward. CSW also reported to [facility] and FOB that if Ward begins to feel SI, HI or have uncommon feelings related  to [facility], to please reach out to providers. Both reported understanding and asked no other questions. Ward reported to this CSW a desire to breast feed so that she can bond with infant. Ward reported that breast feeding has been a challenge for her "Im just not sure if she is getting enough milk"  CSW was notified that Ward and FOB are interested in resources such as [facility] and [facility]. CSW provided Ward with resouces for parenting classes as well. Ward thanked CSW and reported no other  concerns. CSW observed that it was noted in Ward's H&P that she used THC (sporadically). CSW asked Ward about substances use during this pregnancy and Ward reported that she didn't use anything. CSW understanding and will follow CDS for further needs.   CSW provided Ward and FOB with SIDS education as well. Ward reported fear that infant will stop breathing and reports that while here she has been watching infant sleep Ward reported not getting much sleep and reports that she "watches infant as I think she's choking at times". CSW again encouraged Ward to get rest to help reduce signs and symptoms of PPD/A. Ward appeared to be more at ease toward the end of conversation with CSW. CSW praised Ward for being aware of needs and making effort to care and learn infant although being afraid.   At this time there are no barriers to infant discharging to Ward. CSW will further monitor CDS and make report if warranted.   CSW Plan/Description:  Sudden Infant Death Syndrome (SIDS) Education, Perinatal Mood and Anxiety Disorder (PMADs) Education, CSW Will Continue to Monitor Umbilical Cord Tissue Drug Screen Results and Make Report if Warranted, Other Information/Referral to [facility], No Further Intervention Required/No Barriers to Discharge    Robb Matar, LCSWA 01/15/2020, 1:34 PM

## 2020-01-15 NOTE — Lactation Note (Signed)
This note was copied from a baby's chart. Lactation Consultation Note  Patient Name: Girl Keitra Carusone RVACQ'P Date: 01/15/2020 Reason for consult: Follow-up assessment;Early term 37-38.6wks;Primapara;1st time breastfeeding;Infant weight loss;Difficult latch;Nipple pain/trauma(LC asked mom to call for the next feeding since the baby recently fed 30 ml)  Per mom gave the baby formula due to mom sore nipples.  The NT in the middle of vital signs and then mom plans to eat her lunch.  LC recommended when the baby gets hungry to call for [facility] for feeding assessment and  LC will assess soreness.    Maternal Data    Feeding Feeding Type: Bottle Fed - Formula Nipple Type: Slow - flow  LATCH Score ( Latch score by the [facility] )  Latch: Repeated attempts needed to sustain latch, nipple held in mouth throughout feeding, stimulation needed to elicit sucking reflex.  Audible Swallowing: None  Type of Nipple: Everted at rest and after stimulation(short shafts)  Comfort (Breast/Nipple): Filling, red/small blisters or bruises, mild/mod discomfort  Hold (Positioning): Assistance needed to correctly position infant at breast and maintain latch.  LATCH Score: 5  Interventions Interventions: Breast feeding basics reviewed  Lactation Tools Discussed/Used     Consult Status Consult Status: Follow-up Date: 01/15/20 Follow-up type: In-patient    Matilde Sprang Shakirra Buehler 01/15/2020, 1:40 PM

## 2020-01-15 NOTE — Progress Notes (Signed)
Post Partum Day 1 Subjective: no complaints, up ad lib, voiding and tolerating PO  Objective: Blood pressure 106/62, pulse 82, temperature 97.9 F (36.6 C), temperature source Oral, resp. rate 18, weight 100.4 kg, last menstrual period 04/19/2019, SpO2 96 %, unknown if currently breastfeeding.  Physical Exam:  General: alert and cooperative Lochia: appropriate Uterine Fundus: firm DVT Evaluation: No evidence of DVT seen on physical exam. Bilateral LE edema  Recent Labs    01/13/20 2351  HGB 14.4  HCT 42.2    Assessment/Plan: Plan for discharge tomorrow   LOS: 1 day   Sharon Ward M Keaten Mashek 01/15/2020, 7:43 AM

## 2020-01-15 NOTE — Progress Notes (Signed)
CSW is aware of consult. CSW spoke with MOB and FOB shortly, however CSW observed that MOB very emotional  and trying to handle infant while infant cried. CSW offered to get further help for MOB. CSW left room while RN worked with MOB and infant. CSW returned to room at 1:30am and observed that MOB was trying to latch infant to breast, MOB requested that CSW come back later. CSW will follow up to finish assessing MOB and infants needs later today.     Claude Manges Laretta Pyatt, MSW, LCSW [facility] [place] [phone]

## 2020-01-16 MED ORDER — IBUPROFEN 600 MG PO TABS: 600.0000 mg | ORAL_TABLET | Freq: Three times a day (TID) | ORAL | 0 refills | Status: DC | PRN

## 2020-01-16 MED ORDER — ACETAMINOPHEN 325 MG PO TABS: 650.0000 mg | ORAL_TABLET | Freq: Four times a day (QID) | ORAL | 0 refills | Status: DC | PRN

## 2020-01-16 NOTE — Lactation Note (Addendum)
This note was copied from a baby's chart. Lactation Consultation Note  [name] [id number] Date: 01/16/2020 Reason for consult: Follow-up assessment;1st time breastfeeding;Infant weight loss;Early term 37-38.6wks(RN informed LC early mom declined LC and then changed her mind - LC asked the [facility] Elesa Hacker to update the doc flow sheets with feedings)  LC visited mom prior to discharge at moms request after she had declined visit per RN this am.  Mom getting ready for D/C and had several breast feeding questions.  Mom expressed has been expressing milk and baby has been receiving EBM and formula.  Not sure if she is going to work on re-latching baby at the breast or just pump and bottle feed.  Per mom plans to call [facility] for formula only not a DEBP and dad mentioned he plans to buy a DEBP .  Mom has a hand pump and has been using the #24 F and its comfortable. LC provided the comfort gels for after feedings or pumping x6 days and alternating with shells while awake.  LC explored re- latching ( 8-12 times a day if exclusively breast feeding.  If pumping and bottle feeding - 8 - 10 times a day both breast for 15 -20 mins .  Sore nipple and engorgement prevention and tx reviewed.  LC recommended if mom goes home and starts working on the breast feeding and latching to consider calling back for [facility] appt , for reassurance or consider contacting with WIC .  Storage of breast milk reviewed.  LC asked [facility] Elesa Hacker to update the doc flow sheets prior to D/C.   Grandmother holding baby and she is asleep and content.  Mom was very appropriate with all her breast feeding and pumping questions and calm.  Dad and grandmother seem very supportive.    Maternal Data    Feeding    LATCH Score                   Interventions Interventions: Breast feeding basics reviewed;Coconut oil;Shells;Comfort gels;Hand pump  Lactation Tools Discussed/Used Tools:  Shells;Pump;Flanges;Comfort gels;Coconut oil Nipple shield size: (has not been latching -) Flange Size: 24;27 Shell Type: Inverted Breast pump type: Manual   Consult Status Consult Status: Complete Date: 01/16/20    Kathrin Greathouse 01/16/2020, 1:49 PM

## 2020-01-16 NOTE — Progress Notes (Signed)
CSW aware that MOB scored 17 on [place]. CSW did a follow up visit with MOB to ensure that she is feeling okay. MOB reported that she has been feeling a lot better since she was able to get some rest last night. MOB still reported some depression as "I just dont have her inside of me anymore and then I knew she was in there". CSW offered MOB other ways to look at this situation such as now MOB is able to hold and care for infant. MOB chuckled and reported "yeah I know". MOB assured CSW that she is feeling well and expressed no further needs to this CSW.    Sharon Ward S. Asa Fath, MSW, LCSW [facility] [place] [phone]  

## 2020-01-16 NOTE — Discharge Instructions (Signed)

## 2020-01-21 ENCOUNTER — Encounter: Payer: Medicaid Other | Admitting: Obstetrics & Gynecology

## 2020-01-23 ENCOUNTER — Ambulatory Visit: Payer: Medicaid Other

## 2020-01-24 ENCOUNTER — Telehealth (HOSPITAL_COMMUNITY): Payer: Self-pay | Admitting: Psychiatry

## 2020-01-24 DIAGNOSIS — O0990 Supervision of high risk pregnancy, unspecified, unspecified trimester: Secondary | ICD-10-CM

## 2020-01-24 MED ORDER — SERTRALINE HCL 50 MG PO TABS: 75.0000 mg | ORAL_TABLET | Freq: Every day | ORAL | 0 refills | Status: DC

## 2020-01-24 NOTE — Telephone Encounter (Signed)
Pt needs refill on zoloft [facility]  cb [phone]

## 2020-01-24 NOTE — Telephone Encounter (Signed)
sent 

## 2020-01-25 ENCOUNTER — Other Ambulatory Visit: Payer: Self-pay

## 2020-01-25 ENCOUNTER — Ambulatory Visit (INDEPENDENT_AMBULATORY_CARE_PROVIDER_SITE_OTHER): Payer: Medicaid Other | Admitting: *Deleted

## 2020-01-25 VITALS — BP 107/70 | HR 69 | Ht 63.0 in | Wt 196.0 lb

## 2020-01-25 DIAGNOSIS — Z0130 Encounter for examination of blood pressure without abnormal findings: Secondary | ICD-10-CM

## 2020-01-25 DIAGNOSIS — F411 Generalized anxiety disorder: Secondary | ICD-10-CM

## 2020-01-25 DIAGNOSIS — Z8759 Personal history of other complications of pregnancy, childbirth and the puerperium: Secondary | ICD-10-CM

## 2020-01-25 DIAGNOSIS — F32A Depression, unspecified: Secondary | ICD-10-CM

## 2020-01-25 NOTE — Progress Notes (Signed)
Pt presents for BP check - 107/70, P - 69. Per chart review, pt had gHTN and delivered viable infant on 01/14/20. She reports increased amount of H/A since delivery of baby. Pt also states she had frequent H/A before and during pregnancy. She has tried occasional tylenol with no relief. She has not tried ibuprofen and I encouraged her to try this medication. She also has been having blurry vision which is not new - is the same as during pregnancy. Pt states that she wears glasses however they are not the correct Rx and is due for eye exam. She denied scotomata. Pt has BP cuff however has not checked her BP since leaving the hospital. Pt was asked to check BP when she has a H/A and if greater than 140/90, she should contact the office or proceed to MAU for evaluation. Also, she should go to MAU if she develops extreme blurry vision (different than her baseline) dizziness or is seeing spots. Today, pt has a very flat affect with PHQ-9 score 18 and GAD-7 score 14. Pt states she has been sleeping a lot and struggles with depression and anxiety. She takes Seroquel and Zoloft. She has previously been seen by Hulda Marin and is scheduled for follow up on 6/21. Pt states she will keep this appointment. Pt reports that her boyfriend is very helpful with care of the baby. I encouraged pt to have an eye exam as soon as possible in hopes this will help with her H/A. Pt also has PP virtual visit scheduled on 7/6. Pt voiced understanding of all information and instructions given.

## 2020-01-28 ENCOUNTER — Ambulatory Visit (INDEPENDENT_AMBULATORY_CARE_PROVIDER_SITE_OTHER): Payer: No Typology Code available for payment source | Admitting: Clinical

## 2020-01-28 ENCOUNTER — Other Ambulatory Visit: Payer: Self-pay

## 2020-01-28 ENCOUNTER — Encounter: Payer: Medicaid Other | Admitting: Women's Health

## 2020-01-28 ENCOUNTER — Telehealth (HOSPITAL_COMMUNITY): Payer: Self-pay | Admitting: Psychiatry

## 2020-01-28 DIAGNOSIS — F3160 Bipolar disorder, current episode mixed, unspecified: Secondary | ICD-10-CM

## 2020-01-28 DIAGNOSIS — O99345 Other mental disorders complicating the puerperium: Secondary | ICD-10-CM

## 2020-01-28 DIAGNOSIS — F411 Generalized anxiety disorder: Secondary | ICD-10-CM | POA: Diagnosis not present

## 2020-01-28 MED ORDER — QUETIAPINE FUMARATE 50 MG PO TABS: 50.0000 mg | ORAL_TABLET | Freq: Every day | ORAL | 0 refills | Status: DC

## 2020-01-28 NOTE — Patient Instructions (Signed)
[facility] [address] [place], [place] [address] [phone] (main office) [phone] [facility] office)  New mom (and dad) online support groups:   [email or web address]   [email or web address]

## 2020-01-28 NOTE — Telephone Encounter (Signed)
Pt needs refill on seroquel [facility]

## 2020-01-28 NOTE — Progress Notes (Signed)
I have reviewed this chart and agree with the RN/CMA assessment and management.    K. Meryl Tyreon Frigon, M.D. Attending [facility] (Faculty Practice)   

## 2020-01-28 NOTE — Telephone Encounter (Signed)
sent 

## 2020-01-29 ENCOUNTER — Encounter: Payer: Self-pay | Admitting: *Deleted

## 2020-01-31 ENCOUNTER — Ambulatory Visit (INDEPENDENT_AMBULATORY_CARE_PROVIDER_SITE_OTHER): Payer: No Typology Code available for payment source | Admitting: Licensed Clinical Social Worker

## 2020-01-31 DIAGNOSIS — F3160 Bipolar disorder, current episode mixed, unspecified: Secondary | ICD-10-CM | POA: Diagnosis not present

## 2020-01-31 NOTE — Progress Notes (Signed)
   THERAPIST PROGRESS NOTE  Session Time: 10:00 am-10:45 am  Participation Level: Active  Behavioral Response: CasualAlertAnxious and Depressed  Type of Therapy: Individual Therapy  Treatment Goals addressed: Coping  Interventions: CBT and Solution Focused  Case Summary: Sharon Ward is a 24 y.o. female who presents oriented x5 (person, place, situation, time and object), casually dressed, appropriately groomed, average height, increased weight due to pregnancy, and cooperative to address mood and anxiety. Patient has a history of medical treatment including asthma and seasonal allergies. Patient has a history of mental health treatment including medication management, outpatient therapy, and treatment for eating disorder. Patient admits to passive SI but denies plan and means. Patient denies homicidal ideations. Patient denies psychosis including auditory and visual hallucinations. Patient denies substance abuse. Patient is at low risk for lethality.  Session #3  Physically: Patient is struggling with her postpartum body. Patient feels like her eating disorder has reemerged. She is eating one to two times a day and some times 3 times a day. Patient is able to sleep through the night. Her boyfriend lets her sleep through the night.  Spiritually/values: No issues identified.  Relationships: Patient is getting along with her boyfriend and her daughter. She struggles with being a mom. She feels like she isn't doing a good job. Patient gets frustrated when her daughter cries when she changes her. After discussion, patient understood that her daughter's reaction is normal and it is not anything she is doing or not doing as a mother. Also, that while there are benefits to breastfeeding, the most important factor is that she is keeping her child feed-whether that is breat milk or formula. Patient feels a lot of judgement for not being able to breast feed and feels like healthcare providers are  "shoving it down my throat" and makes her feel like she is not a good mother for not being able to breast feed.   Emotionally/Mentally/Behavior: Patient has been down. She has expressed SI but denies plan or means. She feels this way when frustrated. Patient feels like she doesn't know what she is doing as a mother which triggers feelings of depression. Patient was reassured that she is doing everything she can as a mother and she is still learning. Patient agreed to work on not judging herself harshly for things such as her daughter crying when she changes her.   Patient engaged in session. She responded well to interventions. Patient continues to meet criteria for Bipolar 1, mixed and GAD. Patient will continue in outpatient therapy due to being the least restrictive service to meet her needs. Patient made minimal progress on her goals at this time.   Suicidal/Homicidal: Negativewithout intent/plan  Therapist Response: Therapist reviewed patient's recent thoughts and behaviors. Therapist utilized CBT to address mood and anxiety. Therapist processed patient's feelings to identify triggers for mood and anxiety. Therapist discussed patient's experience as a new mother and judgements she feels about herself as well as from others.   Plan: Return again in 1 weeks.  Diagnosis: Axis I: Bipolar, mixed and Generalized Anxiety Disorder    Axis II: No diagnosis    Bynum Bellows, LCSW 01/31/2020

## 2020-02-06 ENCOUNTER — Telehealth (INDEPENDENT_AMBULATORY_CARE_PROVIDER_SITE_OTHER): Payer: No Typology Code available for payment source | Admitting: Psychiatry

## 2020-02-06 ENCOUNTER — Encounter (HOSPITAL_COMMUNITY): Payer: Self-pay | Admitting: Psychiatry

## 2020-02-06 DIAGNOSIS — O0990 Supervision of high risk pregnancy, unspecified, unspecified trimester: Secondary | ICD-10-CM | POA: Diagnosis not present

## 2020-02-06 DIAGNOSIS — Z8659 Personal history of other mental and behavioral disorders: Secondary | ICD-10-CM | POA: Diagnosis not present

## 2020-02-06 DIAGNOSIS — F411 Generalized anxiety disorder: Secondary | ICD-10-CM | POA: Diagnosis not present

## 2020-02-06 DIAGNOSIS — F3160 Bipolar disorder, current episode mixed, unspecified: Secondary | ICD-10-CM

## 2020-02-06 MED ORDER — QUETIAPINE FUMARATE 100 MG PO TABS: 100.0000 mg | ORAL_TABLET | Freq: Every day | ORAL | 0 refills | Status: DC

## 2020-02-06 MED ORDER — SERTRALINE HCL 100 MG PO TABS: 100.0000 mg | ORAL_TABLET | Freq: Every day | ORAL | 0 refills | Status: DC
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
## 2020-02-08 ENCOUNTER — Telehealth (INDEPENDENT_AMBULATORY_CARE_PROVIDER_SITE_OTHER): Payer: Medicaid Other | Admitting: Lactation Services

## 2020-02-08 DIAGNOSIS — F319 Bipolar disorder, unspecified: Secondary | ICD-10-CM

## 2020-02-08 NOTE — Telephone Encounter (Signed)
Received call from nurse at [facility] who reports patient is having some difficulty and that she may need to follow up with [facility], LCSW.  Sharon Ward is not in the office. Nurse asked that she be contacted back or that the patient be called back.   Called patient and she reports she has spoken with a provider at [facility] today. She is asking that Sharon Ward call her back, she was informed Sharon Ward is on vacation and was fine with Sharon Ward calling her back next week also. Message forwarded to Sharon Ward and Sharon Ward to follow up with Patient.   Patient was informed of [facility] [address] if she needs urgent care.

## 2020-02-11 ENCOUNTER — Emergency Department (HOSPITAL_COMMUNITY)
Admission: EM | Admit: 2020-02-11 | Discharge: 2020-02-12 | Disposition: A | Discharge status: Home / Self Care | Payer: No Typology Code available for payment source | Attending: Emergency Medicine | Admitting: Emergency Medicine

## 2020-02-11 ENCOUNTER — Other Ambulatory Visit: Payer: Self-pay

## 2020-02-11 ENCOUNTER — Encounter (HOSPITAL_COMMUNITY): Payer: Self-pay | Admitting: Emergency Medicine

## 2020-02-11 DIAGNOSIS — Z79899 Other long term (current) drug therapy: Secondary | ICD-10-CM | POA: Diagnosis not present

## 2020-02-11 DIAGNOSIS — F419 Anxiety disorder, unspecified: Secondary | ICD-10-CM | POA: Insufficient documentation

## 2020-02-11 DIAGNOSIS — Z20822 Contact with and (suspected) exposure to covid-19: Secondary | ICD-10-CM | POA: Diagnosis not present

## 2020-02-11 DIAGNOSIS — Z87891 Personal history of nicotine dependence: Secondary | ICD-10-CM | POA: Diagnosis not present

## 2020-02-11 DIAGNOSIS — J45909 Unspecified asthma, uncomplicated: Secondary | ICD-10-CM | POA: Insufficient documentation

## 2020-02-11 DIAGNOSIS — I10 Essential (primary) hypertension: Secondary | ICD-10-CM | POA: Diagnosis not present

## 2020-02-11 DIAGNOSIS — F53 Postpartum depression: Secondary | ICD-10-CM | POA: Insufficient documentation

## 2020-02-11 LAB — COMPREHENSIVE METABOLIC PANEL
ALT: 30 U/L (ref 0–44)
AST: 27 U/L (ref 15–41)
Albumin: 4.6 g/dL (ref 3.5–5.0)
Alkaline Phosphatase: 94 U/L (ref 38–126)
Anion gap: 13 (ref 5–15)
BUN: 9 mg/dL (ref 6–20)
CO2: 23 mmol/L (ref 22–32)
Calcium: 9.7 mg/dL (ref 8.9–10.3)
Chloride: 104 mmol/L (ref 98–111)
Creatinine, Ser: 0.82 mg/dL (ref 0.44–1.00)
GFR calc Af Amer: >60 mL/min (ref >60)
GFR calc non Af Amer: >60 mL/min (ref >60)
Glucose, Bld: 118 mg/dL — ABNORMAL HIGH (ref 70–99)
Potassium: 3.3 mmol/L — ABNORMAL LOW (ref 3.5–5.1)
Sodium: 140 mmol/L (ref 135–145)
Total Bilirubin: 0.7 mg/dL (ref 0.3–1.2)
Total Protein: 7.8 g/dL (ref 6.5–8.1)

## 2020-02-11 LAB — RAPID URINE DRUG SCREEN, HOSP PERFORMED
Amphetamines: NOT DETECTED
Barbiturates: NOT DETECTED
Benzodiazepines: NOT DETECTED
Cocaine: NOT DETECTED
Opiates: NOT DETECTED
Tetrahydrocannabinol: NOT DETECTED

## 2020-02-11 LAB — CBC WITH DIFFERENTIAL/PLATELET
Abs Immature Granulocytes: 0.01 10*3/uL (ref 0.00–0.07)
Basophils Absolute: 0 10*3/uL (ref 0.0–0.1)
Basophils Relative: 1 %
Eosinophils Absolute: 0 10*3/uL (ref 0.0–0.5)
Eosinophils Relative: 0 %
HCT: 45.6 % (ref 36.0–46.0)
Hemoglobin: 15.7 g/dL — ABNORMAL HIGH (ref 12.0–15.0)
Immature Granulocytes: 0 %
Lymphocytes Relative: 31 %
Lymphs Abs: 1.9 10*3/uL (ref 0.7–4.0)
MCH: 32.5 pg (ref 26.0–34.0)
MCHC: 34.4 g/dL (ref 30.0–36.0)
MCV: 94.4 fL (ref 80.0–100.0)
Monocytes Absolute: 0.5 10*3/uL (ref 0.1–1.0)
Monocytes Relative: 8 %
Neutro Abs: 3.7 10*3/uL (ref 1.7–7.7)
Neutrophils Relative %: 60 %
Platelets: 237 10*3/uL (ref 150–400)
RBC: 4.83 MIL/uL (ref 3.87–5.11)
RDW: 11.4 % — ABNORMAL LOW (ref 11.5–15.5)
WBC: 6.1 10*3/uL (ref 4.0–10.5)
nRBC: 0 % (ref 0.0–0.2)

## 2020-02-11 LAB — I-STAT BETA HCG BLOOD, ED (MC, WL, AP ONLY): I-stat hCG, quantitative: <5 m[IU]/mL (ref <5)

## 2020-02-11 LAB — ETHANOL: Alcohol, Ethyl (B): <10 mg/dL (ref <10)

## 2020-02-11 LAB — SARS CORONAVIRUS 2 BY RT PCR (HOSPITAL ORDER, PERFORMED IN ~~LOC~~ HOSPITAL LAB): SARS Coronavirus 2: NEGATIVE

## 2020-02-11 MED ORDER — QUETIAPINE FUMARATE 100 MG PO TABS
100.0000 mg | ORAL_TABLET | Freq: Every day | ORAL | Status: DC
  Administered 2020-02-11: 100 mg via ORAL
  Filled 2020-02-11: qty 1

## 2020-02-11 MED ORDER — ACETAMINOPHEN 325 MG PO TABS: 650.0000 mg | ORAL_TABLET | Freq: Four times a day (QID) | ORAL | Status: DC | PRN

## 2020-02-11 MED ORDER — SERTRALINE HCL 50 MG PO TABS
100.0000 mg | ORAL_TABLET | Freq: Every day | ORAL | Status: DC
  Administered 2020-02-12: 100 mg via ORAL
  Filled 2020-02-11: qty 2

## 2020-02-11 NOTE — ED Triage Notes (Signed)
Pt brought in under IVC papers that state: "respondent has been diagnosed with major depression, bipolar disorder, general anxiety, post partum. Respondent is not taking prescribed meds. respondent was holding child stating that she would kill the baby and herself. Sent a picture over 50 pills aying out in the open. States to baby's father, that he would not have to worry about her or the baby soon. Hits herself in the presence of other, bite her hands and pound her head till it bleeds, and claws at her forearms and tear hair out".
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
## 2020-02-11 NOTE — ED Notes (Signed)
Jason Berry, NP, patient meets inpatient criteria. TTS to secure placement.  
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
## 2020-02-12 ENCOUNTER — Telehealth: Payer: Medicaid Other | Admitting: Nurse Practitioner
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
## 2020-02-12 NOTE — BH Assessment (Signed)
[facility] Assessment Progress Note  Per Caryn Bee, DNP, this pt does not require psychiatric hospitalization at this time.  Pt presents under IVC initiated by an unspecified individual and initially upheld by EDP Chaney Malling, MD, but now rescinded by Nelly Rout, MD.  Pt is to be discharged from [facility] with recommendation to continue treatment at the [facility] at [place].  This has been included in pt's discharge instructions along with her upcoming appointments.  Pt's nurse has been notified.  Doylene Canning, MA Triage Specialist [phone]

## 2020-02-12 NOTE — ED Notes (Addendum)
[facility]. Social worker notified and will set up transport for pt.

## 2020-02-12 NOTE — Discharge Instructions (Signed)
For your behavioral health needs you are advised to continue treatment at the [facility] at [place].  Plan to keep your current appointments as indicated below:       [facility] at [facility] [address]      [place], [place] [address]      [phone]       Bynum Bellows, LCSW - Thursday, February 14, 2020 at 2:00 pm      [name], MD      - Tuesday, February 19, 2020 at 2:30 pm

## 2020-02-12 NOTE — ED Notes (Signed)
IVC papers have been resent and patient is no longer IVCd. Patient recommened to follow up at [facility] for behavioral health needs.
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
## 2020-02-13 NOTE — Telephone Encounter (Signed)
Sorry for the late response Jasmine December I am out on medical leave and will not return until 02/20/2020.

## 2020-02-14 ENCOUNTER — Ambulatory Visit (INDEPENDENT_AMBULATORY_CARE_PROVIDER_SITE_OTHER): Payer: No Typology Code available for payment source | Admitting: Licensed Clinical Social Worker

## 2020-02-14 DIAGNOSIS — F3160 Bipolar disorder, current episode mixed, unspecified: Secondary | ICD-10-CM | POA: Diagnosis not present
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
## 2020-02-18 ENCOUNTER — Telehealth: Payer: Self-pay | Admitting: Clinical
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
## 2020-02-19 ENCOUNTER — Encounter (HOSPITAL_COMMUNITY): Payer: Self-pay | Admitting: Psychiatry

## 2020-02-19 ENCOUNTER — Telehealth (INDEPENDENT_AMBULATORY_CARE_PROVIDER_SITE_OTHER): Payer: No Typology Code available for payment source | Admitting: Psychiatry

## 2020-02-19 DIAGNOSIS — F411 Generalized anxiety disorder: Secondary | ICD-10-CM | POA: Diagnosis not present

## 2020-02-19 DIAGNOSIS — Z639 Problem related to primary support group, unspecified: Secondary | ICD-10-CM

## 2020-02-19 DIAGNOSIS — F3160 Bipolar disorder, current episode mixed, unspecified: Secondary | ICD-10-CM

## 2020-02-19 MED ORDER — QUETIAPINE FUMARATE 50 MG PO TABS: 50.0000 mg | ORAL_TABLET | Freq: Every day | ORAL | 0 refills | Status: DC
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
## 2020-02-21 NOTE — Telephone Encounter (Signed)
Sent message to [facility], LCSW and Sue Lush, [place] to follow up with patient.

## 2020-02-25 ENCOUNTER — Telehealth (HOSPITAL_COMMUNITY): Payer: Self-pay | Admitting: Psychiatry

## 2020-02-25 MED ORDER — QUETIAPINE FUMARATE 50 MG PO TABS: 150.0000 mg | ORAL_TABLET | Freq: Every day | ORAL | 0 refills | Status: DC

## 2020-02-25 NOTE — Telephone Encounter (Signed)
seroquel rx directions needs to be re-written

## 2020-02-25 NOTE — Telephone Encounter (Signed)
Corrected in chart and resent to pharmacy

## 2020-02-27 ENCOUNTER — Other Ambulatory Visit: Payer: Self-pay

## 2020-02-27 ENCOUNTER — Emergency Department (HOSPITAL_COMMUNITY)
Admission: EM | Admit: 2020-02-27 | Discharge: 2020-02-27 | Disposition: A | Discharge status: Home / Self Care | Payer: Medicaid Other | Attending: Emergency Medicine | Admitting: Emergency Medicine

## 2020-02-27 ENCOUNTER — Encounter (HOSPITAL_COMMUNITY): Payer: Self-pay | Admitting: Emergency Medicine

## 2020-02-27 DIAGNOSIS — R5383 Other fatigue: Secondary | ICD-10-CM | POA: Diagnosis not present

## 2020-02-27 DIAGNOSIS — R519 Headache, unspecified: Secondary | ICD-10-CM | POA: Insufficient documentation

## 2020-02-27 DIAGNOSIS — Z5321 Procedure and treatment not carried out due to patient leaving prior to being seen by health care provider: Secondary | ICD-10-CM | POA: Insufficient documentation

## 2020-02-27 DIAGNOSIS — R531 Weakness: Secondary | ICD-10-CM | POA: Insufficient documentation

## 2020-02-27 LAB — CBC WITH DIFFERENTIAL/PLATELET
Abs Immature Granulocytes: 0.02 10*3/uL (ref 0.00–0.07)
Basophils Absolute: 0 10*3/uL (ref 0.0–0.1)
Basophils Relative: 0 %
Eosinophils Absolute: 0 10*3/uL (ref 0.0–0.5)
Eosinophils Relative: 0 %
HCT: 42.8 % (ref 36.0–46.0)
Hemoglobin: 14 g/dL (ref 12.0–15.0)
Immature Granulocytes: 0 %
Lymphocytes Relative: 24 %
Lymphs Abs: 1.6 10*3/uL (ref 0.7–4.0)
MCH: 31.9 pg (ref 26.0–34.0)
MCHC: 32.7 g/dL (ref 30.0–36.0)
MCV: 97.5 fL (ref 80.0–100.0)
Monocytes Absolute: 0.6 10*3/uL (ref 0.1–1.0)
Monocytes Relative: 8 %
Neutro Abs: 4.6 10*3/uL (ref 1.7–7.7)
Neutrophils Relative %: 68 %
Platelets: 198 10*3/uL (ref 150–400)
RBC: 4.39 MIL/uL (ref 3.87–5.11)
RDW: 12.2 % (ref 11.5–15.5)
WBC: 6.9 10*3/uL (ref 4.0–10.5)
nRBC: 0 % (ref 0.0–0.2)

## 2020-02-27 LAB — BASIC METABOLIC PANEL
Anion gap: 8 (ref 5–15)
BUN: 13 mg/dL (ref 6–20)
CO2: 24 mmol/L (ref 22–32)
Calcium: 9.6 mg/dL (ref 8.9–10.3)
Chloride: 109 mmol/L (ref 98–111)
Creatinine, Ser: 0.77 mg/dL (ref 0.44–1.00)
GFR calc Af Amer: >60 mL/min (ref >60)
GFR calc non Af Amer: >60 mL/min (ref >60)
Glucose, Bld: 86 mg/dL (ref 70–99)
Potassium: 3.9 mmol/L (ref 3.5–5.1)
Sodium: 141 mmol/L (ref 135–145)

## 2020-02-27 LAB — URINALYSIS, ROUTINE W REFLEX MICROSCOPIC
Bilirubin Urine: NEGATIVE
Glucose, UA: NEGATIVE mg/dL
Ketones, ur: NEGATIVE mg/dL
Nitrite: NEGATIVE
Protein, ur: NEGATIVE mg/dL
Specific Gravity, Urine: 1.021 (ref 1.005–1.030)
pH: 6 (ref 5.0–8.0)

## 2020-02-27 LAB — I-STAT BETA HCG BLOOD, ED (MC, WL, AP ONLY): I-stat hCG, quantitative: <5 m[IU]/mL (ref <5)

## 2020-02-27 NOTE — ED Triage Notes (Signed)
Patient reports generalized weakness/fatigue with migraine headache today , denies emesis or fever .

## 2020-02-27 NOTE — ED Notes (Signed)
Pt called for vitals x3. No answer 

## 2020-03-04 ENCOUNTER — Other Ambulatory Visit (HOSPITAL_COMMUNITY)
Admission: RE | Admit: 2020-03-04 | Discharge: 2020-03-04 | Disposition: A | Discharge status: Home / Self Care | Payer: Medicaid Other | Source: Ambulatory Visit | Attending: Student | Admitting: Student

## 2020-03-04 ENCOUNTER — Encounter: Payer: Self-pay | Admitting: Certified Nurse Midwife

## 2020-03-04 ENCOUNTER — Ambulatory Visit (INDEPENDENT_AMBULATORY_CARE_PROVIDER_SITE_OTHER): Payer: Medicaid Other | Admitting: Certified Nurse Midwife

## 2020-03-04 ENCOUNTER — Other Ambulatory Visit: Payer: Self-pay

## 2020-03-04 DIAGNOSIS — Z113 Encounter for screening for infections with a predominantly sexual mode of transmission: Secondary | ICD-10-CM | POA: Diagnosis present

## 2020-03-04 DIAGNOSIS — Z3009 Encounter for other general counseling and advice on contraception: Secondary | ICD-10-CM
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
## 2020-03-06 LAB — CERVICOVAGINAL ANCILLARY ONLY
Chlamydia: NEGATIVE
Comment: NEGATIVE
Comment: NEGATIVE
Comment: NORMAL
Neisseria Gonorrhea: NEGATIVE
Trichomonas: NEGATIVE

## 2020-03-12 ENCOUNTER — Encounter (HOSPITAL_COMMUNITY): Payer: Self-pay | Admitting: Psychiatry

## 2020-03-12 ENCOUNTER — Telehealth (INDEPENDENT_AMBULATORY_CARE_PROVIDER_SITE_OTHER): Payer: No Typology Code available for payment source | Admitting: Psychiatry

## 2020-03-12 DIAGNOSIS — F411 Generalized anxiety disorder: Secondary | ICD-10-CM

## 2020-03-12 DIAGNOSIS — F3160 Bipolar disorder, current episode mixed, unspecified: Secondary | ICD-10-CM

## 2020-03-12 DIAGNOSIS — Z639 Problem related to primary support group, unspecified: Secondary | ICD-10-CM

## 2020-03-12 DIAGNOSIS — O0990 Supervision of high risk pregnancy, unspecified, unspecified trimester: Secondary | ICD-10-CM

## 2020-03-12 MED ORDER — QUETIAPINE FUMARATE 50 MG PO TABS: 150.0000 mg | ORAL_TABLET | Freq: Every day | ORAL | 0 refills | Status: DC

## 2020-03-12 MED ORDER — SERTRALINE HCL 100 MG PO TABS: 100.0000 mg | ORAL_TABLET | Freq: Every day | ORAL | 0 refills | Status: DC
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
## 2020-03-18 ENCOUNTER — Ambulatory Visit (INDEPENDENT_AMBULATORY_CARE_PROVIDER_SITE_OTHER): Payer: No Typology Code available for payment source | Admitting: Licensed Clinical Social Worker

## 2020-03-18 DIAGNOSIS — F3160 Bipolar disorder, current episode mixed, unspecified: Secondary | ICD-10-CM | POA: Diagnosis not present
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
## 2020-03-25 ENCOUNTER — Other Ambulatory Visit (HOSPITAL_COMMUNITY): Payer: Self-pay

## 2020-03-25 MED ORDER — QUETIAPINE FUMARATE 50 MG PO TABS: 150.0000 mg | ORAL_TABLET | Freq: Every day | ORAL | 0 refills | Status: DC

## 2020-04-15 ENCOUNTER — Telehealth (INDEPENDENT_AMBULATORY_CARE_PROVIDER_SITE_OTHER): Payer: No Typology Code available for payment source | Admitting: Psychiatry

## 2020-04-15 ENCOUNTER — Encounter (HOSPITAL_COMMUNITY): Payer: Self-pay | Admitting: Psychiatry

## 2020-04-15 DIAGNOSIS — F411 Generalized anxiety disorder: Secondary | ICD-10-CM

## 2020-04-15 DIAGNOSIS — F3160 Bipolar disorder, current episode mixed, unspecified: Secondary | ICD-10-CM

## 2020-04-15 DIAGNOSIS — Z8659 Personal history of other mental and behavioral disorders: Secondary | ICD-10-CM | POA: Diagnosis not present

## 2020-04-15 DIAGNOSIS — O0990 Supervision of high risk pregnancy, unspecified, unspecified trimester: Secondary | ICD-10-CM | POA: Diagnosis not present

## 2020-04-15 MED ORDER — SERTRALINE HCL 100 MG PO TABS: 100.0000 mg | ORAL_TABLET | Freq: Every day | ORAL | 0 refills | Status: DC

## 2020-04-15 MED ORDER — QUETIAPINE FUMARATE 50 MG PO TABS: 50.0000 mg | ORAL_TABLET | Freq: Every day | ORAL | 0 refills | Status: DC

## 2020-04-15 NOTE — Progress Notes (Signed)
[place] Follow up visit  Patient Identification: Sharon Ward MRN:  [id number] Date of Evaluation:  04/15/2020 Referral Source: primary care, OBGYN Chief Complaint:   depression follow up  Visit Diagnosis:    ICD-10-CM   1. Bipolar I disorder, most recent episode mixed ([place])  F31.60   2. GAD (generalized anxiety disorder)  F41.1   3. History of borderline personality disorder  Z86.59   4. Supervision of high risk pregnancy, antepartum  O09.90 sertraline (ZOLOFT) 100 MG tablet       I connected with Sharon Ward on 04/15/20 at  1:15 PM EDT by telephone and verified that I am speaking with the correct person using two identifiers.      I discussed the limitations of evaluation and management by telemedicine and the availability of in person appointments. The patient expressed understanding and agreed to proceed. Did audio as she did not feel wanting to do video   Patient location: home Provider location: home office  History of Present Illness: Patient is a 84 years  Caucasian female moved from [place] and referred  to establish services with psychiatry she has been seeing Dr. Zigmund Daniel for the last 3 years and has been reasonably stable on her current medication for bipolar and depression    She has 3 month baby whom was taken away by BF last visit was stressed about court hearings.  Finally he has planned to take away 2 b and agreed to half half custody She is able to see and keep her 3 days a week  That stress is low  She has otherwise stated that she is possible pregnant and wants to cut down med Feels seroquel makes her sedate  I strongly encouraged to get pregnancy test and make appointment with OBG ASAP  Says her current BF is supportive and she has family support Mood wise doing fair and better but wants regular therapy to deal with stressors which changes and effect her   Denies hopelessness Increased seroquel has helped mood in the past but circumstances are better  regarding custody now   Trying to distract from excessive worries and has therapy appointment to deal with current stressors Denies using drugs since she got pregnant No psychotic symptoms    Modifying factors; friends, family Duration since young age    Past Psychiatric History: depression, anxiety , low self esteem  Previous Psychotropic Medications: Yes   Substance Abuse History in the last 12 months:  Yes.    Consequences of Substance Abuse: Used THC prior in the past, understands not to use when pregnant and its effect  Past Medical History:  Past Medical History:  Diagnosis Date  . Anxiety   . Asthma   . Depression     Past Surgical History:  Procedure Laterality Date  . PILONIDAL CYST / SINUS EXCISION  09/11/2013  . PILONIDAL CYST EXCISION  05/17/2014   Pilonidal cystectomy with cleft lip    Family Psychiatric History: dad Schizophrenia. [place] father depression. Brother; anxiety  Family History:  Family History  Problem Relation Age of Onset  . Healthy Mother   . Healthy Father     Social History:   Social History   Socioeconomic History  . Marital status: Significant Other    Spouse name: Not on file  . Number of children: Not on file  . Years of education: Not on file  . Highest education level: Not on file  Occupational History  . Not on file  Tobacco Use  . Smoking  status: Former Research scientist (life sciences)  . Smokeless tobacco: Never Used  . Tobacco comment: only smoke a "couple" of cigarettes when stressed or anxious, socially with friends per [facility] chart  Vaping Use  . Vaping Use: Never used  Substance and Sexual Activity  . Alcohol use: Not Currently    Comment: occasional prior to pregnancy  . Drug use: Not Currently    Types: Marijuana    Comment: reports use 4 or 5 times; last used 05/04/19  . Sexual activity: Yes    Partners: Male  Other Topics Concern  . Not on file  Social History Narrative  . Not on file   Social Determinants of Health    Financial Resource Strain:   . Difficulty of Paying Living Expenses: Not on file  Food Insecurity: No Food Insecurity  . Worried About Charity fundraiser in the Last Year: Never true  . Ran Out of Food in the Last Year: Never true  Transportation Needs: No Transportation Needs  . Lack of Transportation (Medical): No  . Lack of Transportation (Non-Medical): No  Physical Activity:   . Days of Exercise per Week: Not on file  . Minutes of Exercise per Session: Not on file  Stress:   . Feeling of Stress : Not on file  Social Connections:   . Frequency of Communication with Friends and Family: Not on file  . Frequency of Social Gatherings with Friends and Family: Not on file  . Attends Religious Services: Not on file  . Active Member of Clubs or Organizations: Not on file  . Attends Archivist Meetings: Not on file  . Marital Status: Not on file       Allergies:   Allergies  Allergen Reactions  . Ascorbate Rash  . Citrus Rash  . Coconut Flavor Rash  . Lamotrigine Rash  . Orange (Diagnostic) Rash  . Peach Flavor Rash  . Pear Rash  . Pineapple Rash    Metabolic Disorder Labs: No results found for: HGBA1C, MPG No results found for: PROLACTIN No results found for: CHOL, TRIG, HDL, CHOLHDL, VLDL, LDLCALC No results found for: TSH  Therapeutic Level Labs: No results found for: LITHIUM No results found for: CBMZ No results found for: VALPROATE  Current Medications: Current Outpatient Medications  Medication Sig Dispense Refill  . acetaminophen (TYLENOL) 500 MG tablet Take 1,000 mg by mouth every 6 (six) hours as needed for mild pain, moderate pain, fever or headache.    . Blood Pressure Monitoring (BLOOD PRESSURE KIT) DEVI 1 Device by Does not apply route as needed. 1 each 0  . docusate sodium (COLACE) 100 MG capsule Take 100 mg by mouth daily.     . ferrous sulfate 325 (65 FE) MG tablet Take 325 mg by mouth daily with breakfast.    . folic acid (FOLVITE) 585  MCG tablet Take 400 mcg by mouth daily.    Marland Kitchen ibuprofen (ADVIL) 600 MG tablet Take 1 tablet (600 mg total) by mouth every 8 (eight) hours as needed for mild pain. 30 tablet 0  . Prenatal Vit-Fe Fumarate-FA (MULTIVITAMIN-PRENATAL) 27-0.8 MG TABS tablet Take 1 tablet by mouth daily at 12 noon.    . QUEtiapine (SEROQUEL) 50 MG tablet Take 1 tablet (50 mg total) by mouth at bedtime. Dose reduced. Should have meds for now . Dose now is 60m qhs 1 tablet 0  . sertraline (ZOLOFT) 100 MG tablet Take 1 tablet (100 mg total) by mouth daily. Refill when due. Dose increased today  30 tablet 0   No current facility-administered medications for this visit.     Psychiatric Specialty Exam: Review of Systems  Cardiovascular: Negative for chest pain.  Neurological: Negative for dizziness.  Psychiatric/Behavioral: Negative for agitation, behavioral problems and hallucinations.    not currently breastfeeding.There is no height or weight on file to calculate BMI.  General Appearance:  Eye Contact:   Speech:  Slow  Volume:  Decreased  Mood:  fair  Affect:  Congruent  Thought Process:  Goal Directed  Orientation:  Full (Time, Place, and Person)  Thought Content:  Rumination  Suicidal Thoughts:  No  Homicidal Thoughts:  No  Memory:  Immediate;   Fair Recent;   Fair  Judgement:  Other:  fair for now  Insight:  Shallow  Psychomotor Activity:  Decreased  Concentration:  Concentration: Fair and Attention Span: Fair  Recall:  [employer] of Knowledge:Fair  Language: Fair  Akathisia:  No  Handed:    AIMS (if indicated): no involuntary movements  Assets:  Desire for Improvement Intimacy Physical Health  ADL's:  Intact  Cognition: WNL  Sleep:  Fair   Screenings: GAD-7     Clinical Support from 01/25/2020 in [place] for [employer] at [facility] for Women Routine Prenatal from 01/10/2020 in [facility] for [employer] at [facility] for Women Routine Prenatal from 01/01/2020 in  [place] for [employer] at [facility] for Women Routine Prenatal from 12/17/2019 in [place] for [place] at [facility] for Women Routine Prenatal from 12/03/2019 in [place] for [facility]  Total GAD-7 Score _0 PHQ2-9     Clinical Support from 01/25/2020 in [place] for [place] at [facility] for Women Routine Prenatal from 01/10/2020 in [place] for [place] at [facility] for Women Routine Prenatal from 01/01/2020 in [place] for [place] at [facility] for Women Routine Prenatal from 12/17/2019 in [place] for [place] at [facility] for Women Routine Prenatal from 12/03/2019 in [place] for [employer]  PHQ-2 Total Score _1 PHQ-9 Total Score _2 Assessment and Plan: as follows  Bipolar depressed or mixed : not hopeless, doing fair, wants to lower seroquel, advised to get pregnancy test discussed risk of medications and its effect during first trimester. Advised to make OBG appointment ASAP Cut down seroquel to 31m No tremors  denies drugs use   Continue to follow with OB and review meds  GAD: stress varies, need to get pregnancy test and continue therapy to deal with stressors, currently on zoloft can continue Call [place] 2-3 days and update regarding pregnancy and obgyn visits.  MDD: somewhat subdued, not hopeless, continue zoloft and therapy, see above  I discussed the assessment and treatment plan with the patient. The patient was provided an opportunity to ask questions and all were answered. The patient agreed with the plan and demonstrated an understanding of the instructions.   The patient was advised to call back or seek an in-person evaluation if the symptoms worsen or if the condition fails to improve as anticipated. Fu 3-4 weeks   or earlier if needed I provided 15- 20 minutes of non-face-to-face time  during this encounter. NMerian Capron MD 9/7/20211:32 PM

## 2020-04-21 ENCOUNTER — Ambulatory Visit (INDEPENDENT_AMBULATORY_CARE_PROVIDER_SITE_OTHER): Payer: No Typology Code available for payment source | Admitting: Licensed Clinical Social Worker

## 2020-04-21 DIAGNOSIS — F3160 Bipolar disorder, current episode mixed, unspecified: Secondary | ICD-10-CM | POA: Diagnosis not present
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
## 2020-04-28 ENCOUNTER — Encounter: Payer: Self-pay | Admitting: *Deleted

## 2020-04-28 ENCOUNTER — Encounter: Payer: Self-pay | Admitting: Family Medicine

## 2020-04-28 ENCOUNTER — Other Ambulatory Visit: Payer: Self-pay

## 2020-04-28 ENCOUNTER — Telehealth (INDEPENDENT_AMBULATORY_CARE_PROVIDER_SITE_OTHER): Payer: Self-pay | Admitting: Student

## 2020-04-28 ENCOUNTER — Ambulatory Visit (INDEPENDENT_AMBULATORY_CARE_PROVIDER_SITE_OTHER): Payer: Self-pay | Admitting: *Deleted

## 2020-04-28 VITALS — BP 105/53 | HR 76 | Ht 63.0 in | Wt 181.7 lb

## 2020-04-28 DIAGNOSIS — Z3201 Encounter for pregnancy test, result positive: Secondary | ICD-10-CM

## 2020-04-28 DIAGNOSIS — Z3200 Encounter for pregnancy test, result unknown: Secondary | ICD-10-CM

## 2020-04-28 LAB — POCT PREGNANCY, URINE: Preg Test, Ur: POSITIVE — AB

## 2020-04-28 NOTE — Telephone Encounter (Signed)
Called patient stating I am returning her phone call. Patient states she actually just left the office and had her questions/concerns addressed. Patient had no questions.

## 2020-04-28 NOTE — Progress Notes (Signed)
Pt here for pregnancy test. I informed her of +UPT. She reports sure LMP 03/30/20 which yield EDD 01/05/20, now [redacted]w[redacted]d. Medication reconciliation completed. She is having some light pink spotting which began last night and some mild abdominal cramping. Pt was advised to go to [facility] if she develops heavy vaginal bleeding or severe abdominal/pelvic pain. Pt was also noted to have elevated PHQ -9 and GAD-7 scores. She stated that she is under the care of a psychiatrist Gilmore Laroche) and has a follow up appt scheduled on 10/7. Pt stated that she just has a lot going on in her life right now. She was advised that she may schedule [facility] appt in this office if desired. She voiced understanding of all information and instructions given.

## 2020-04-28 NOTE — Telephone Encounter (Signed)
Patient has requested a call back from the nurse. She has questions.

## 2020-04-29 ENCOUNTER — Telehealth (INDEPENDENT_AMBULATORY_CARE_PROVIDER_SITE_OTHER): Payer: No Typology Code available for payment source | Admitting: Lactation Services

## 2020-04-29 DIAGNOSIS — Z3201 Encounter for pregnancy test, result positive: Secondary | ICD-10-CM

## 2020-04-29 NOTE — Progress Notes (Signed)
Agree with A & P. 

## 2020-04-29 NOTE — Telephone Encounter (Signed)
Patient called and LM on nurse voicemail that she has been smoking over the weekend and has now fouund out she is newly pregnant. She would like for someone to call her to tell her if she did or did not cause harm to the baby.   Returned patients call. Patient reports she was smoking cigarettes. She reports she was in the office yesterday and that her questions have been answered.

## 2020-05-15 ENCOUNTER — Telehealth (HOSPITAL_COMMUNITY): Payer: No Typology Code available for payment source | Admitting: Psychiatry

## 2020-05-16 ENCOUNTER — Telehealth (INDEPENDENT_AMBULATORY_CARE_PROVIDER_SITE_OTHER): Payer: No Typology Code available for payment source | Admitting: Psychiatry

## 2020-05-16 ENCOUNTER — Encounter (HOSPITAL_COMMUNITY): Payer: Self-pay | Admitting: Psychiatry

## 2020-05-16 DIAGNOSIS — F3160 Bipolar disorder, current episode mixed, unspecified: Secondary | ICD-10-CM | POA: Diagnosis not present

## 2020-05-16 DIAGNOSIS — O0990 Supervision of high risk pregnancy, unspecified, unspecified trimester: Secondary | ICD-10-CM | POA: Diagnosis not present

## 2020-05-16 DIAGNOSIS — Z639 Problem related to primary support group, unspecified: Secondary | ICD-10-CM | POA: Diagnosis not present

## 2020-05-16 DIAGNOSIS — F411 Generalized anxiety disorder: Secondary | ICD-10-CM

## 2020-05-16 MED ORDER — SERTRALINE HCL 50 MG PO TABS: 50.0000 mg | ORAL_TABLET | Freq: Every day | ORAL | 1 refills | Status: DC

## 2020-05-16 MED ORDER — QUETIAPINE FUMARATE 50 MG PO TABS: 50.0000 mg | ORAL_TABLET | Freq: Every day | ORAL | 1 refills | Status: DC
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
## 2020-06-10 ENCOUNTER — Emergency Department (HOSPITAL_COMMUNITY)
Admission: EM | Admit: 2020-06-10 | Discharge: 2020-06-10 | Disposition: A | Discharge status: Home / Self Care | Payer: Medicaid Other | Attending: Emergency Medicine | Admitting: Emergency Medicine

## 2020-06-10 ENCOUNTER — Other Ambulatory Visit: Payer: Self-pay

## 2020-06-10 ENCOUNTER — Encounter (HOSPITAL_COMMUNITY): Payer: Self-pay

## 2020-06-10 ENCOUNTER — Telehealth: Payer: Self-pay | Admitting: Family Medicine

## 2020-06-10 DIAGNOSIS — J45909 Unspecified asthma, uncomplicated: Secondary | ICD-10-CM | POA: Insufficient documentation

## 2020-06-10 DIAGNOSIS — F17210 Nicotine dependence, cigarettes, uncomplicated: Secondary | ICD-10-CM | POA: Diagnosis not present

## 2020-06-10 DIAGNOSIS — J359 Chronic disease of tonsils and adenoids, unspecified: Secondary | ICD-10-CM | POA: Diagnosis not present

## 2020-06-10 DIAGNOSIS — J358 Other chronic diseases of tonsils and adenoids: Secondary | ICD-10-CM

## 2020-06-10 DIAGNOSIS — R070 Pain in throat: Secondary | ICD-10-CM | POA: Diagnosis present

## 2020-06-10 LAB — CBC WITH DIFFERENTIAL/PLATELET
Abs Immature Granulocytes: 0.03 10*3/uL (ref 0.00–0.07)
Basophils Absolute: 0 10*3/uL (ref 0.0–0.1)
Basophils Relative: 1 %
Eosinophils Absolute: 0 10*3/uL (ref 0.0–0.5)
Eosinophils Relative: 0 %
HCT: 43.6 % (ref 36.0–46.0)
Hemoglobin: 14.7 g/dL (ref 12.0–15.0)
Immature Granulocytes: 1 %
Lymphocytes Relative: 24 %
Lymphs Abs: 1.5 10*3/uL (ref 0.7–4.0)
MCH: 32.3 pg (ref 26.0–34.0)
MCHC: 33.7 g/dL (ref 30.0–36.0)
MCV: 95.8 fL (ref 80.0–100.0)
Monocytes Absolute: 0.5 10*3/uL (ref 0.1–1.0)
Monocytes Relative: 7 %
Neutro Abs: 4.3 10*3/uL (ref 1.7–7.7)
Neutrophils Relative %: 67 %
Platelets: 203 10*3/uL (ref 150–400)
RBC: 4.55 MIL/uL (ref 3.87–5.11)
RDW: 12.6 % (ref 11.5–15.5)
WBC: 6.4 10*3/uL (ref 4.0–10.5)
nRBC: 0 % (ref 0.0–0.2)

## 2020-06-10 LAB — COMPREHENSIVE METABOLIC PANEL
ALT: 17 U/L (ref 0–44)
AST: 18 U/L (ref 15–41)
Albumin: 4.1 g/dL (ref 3.5–5.0)
Alkaline Phosphatase: 50 U/L (ref 38–126)
Anion gap: 9 (ref 5–15)
BUN: 5 mg/dL — ABNORMAL LOW (ref 6–20)
CO2: 25 mmol/L (ref 22–32)
Calcium: 9.6 mg/dL (ref 8.9–10.3)
Chloride: 102 mmol/L (ref 98–111)
Creatinine, Ser: 0.54 mg/dL (ref 0.44–1.00)
GFR, Estimated: >60 mL/min (ref >60)
Glucose, Bld: 100 mg/dL — ABNORMAL HIGH (ref 70–99)
Potassium: 4 mmol/L (ref 3.5–5.1)
Sodium: 136 mmol/L (ref 135–145)
Total Bilirubin: 0.9 mg/dL (ref 0.3–1.2)
Total Protein: 7 g/dL (ref 6.5–8.1)

## 2020-06-10 LAB — GROUP A STREP BY PCR: Group A Strep by PCR: NOT DETECTED

## 2020-06-10 MED ORDER — LIDOCAINE VISCOUS HCL 2 % MT SOLN
15.0000 mL | Freq: Once | OROMUCOSAL | Status: AC
  Administered 2020-06-10: 15 mL via OROMUCOSAL
  Filled 2020-06-10: qty 15

## 2020-06-10 MED ORDER — LIDOCAINE VISCOUS HCL 2 % MT SOLN
15.0000 mL | OROMUCOSAL | 0 refills | Status: DC | PRN
Start: 2020-06-10 — End: 2020-08-27

## 2020-06-10 MED ORDER — ACETAMINOPHEN 500 MG PO TABS
1000.0000 mg | ORAL_TABLET | Freq: Once | ORAL | Status: AC
  Administered 2020-06-10: 1000 mg via ORAL
  Filled 2020-06-10: qty 2

## 2020-06-10 NOTE — Telephone Encounter (Signed)
Patient has her first appointment next Wednesday, state she have something going on with something in her mouth and want to talk to someone

## 2020-06-10 NOTE — ED Provider Notes (Signed)
[facility] EMERGENCY DEPARTMENT Provider Note   CSN: [id number] Arrival date & time: 06/10/20  1148     History Chief Complaint  Patient presents with  . Abscess    mouth     Sharon Ward is a 24 y.o. female with pertinent past medical history of current pregnancy approximately [redacted] weeks gestational age, 5 months postpartum, presents to the ED for evaluation of throat pain for the last 3 days.  Reports looking in the mirror at her throat and noticing that her tonsils has a "hole".  Has noticed pus from this hole that she describes as a stone, hard.  Has coughed up and spit out parts of it but still feels like there is some in there.  This is located to the right tonsil.  Reports similar appearance of the left tonsil but the left side does not have any pus or stones in it.  Reports associated right-sided tonsil redness, pain and feeling hot.  Denies any frank fever, congestion, cough, changes in her voice.  Last night she felt she can swallow her saliva very well but states she is not swallowing her saliva normally.  Unvaccinated for COVID-19.  Denies any other associated symptoms.  No sick contacts.  States this happened in August and it spontaneously resolved without any intervention.  HPI     Past Medical History:  Diagnosis Date  . Anxiety   . Asthma   . Depression     Patient Active Problem List   Diagnosis Date Noted  . Labor and delivery, indication for care 01/14/2020  . Gestational hypertension 01/14/2020  . Excess weight gain in pregnancy 10/25/2019  . Encounter for supervision of normal first pregnancy in third trimester 09/28/2019  . Vaginal bleeding during pregnancy 09/28/2019  . Obesity affecting pregnancy 09/28/2019  . Late prenatal care affecting pregnancy in second trimester 09/28/2019  . Anxiety 09/18/2019  . Asthma 09/18/2019  . Bipolar disorder ([place]) 09/18/2019  . Depression 09/18/2019  . LGSIL on Pap smear of cervix 09/18/2019  .  Oppositional defiant disorder 09/18/2019  . H/O psychiatric hospitalization 09/18/2019  . Seasonal allergies 09/18/2019  . Group beta Strep positive 07/02/2019    Past Surgical History:  Procedure Laterality Date  . PILONIDAL CYST / SINUS EXCISION  09/11/2013  . PILONIDAL CYST EXCISION  05/17/2014   Pilonidal cystectomy with cleft lip     OB History    Gravida  2   Para  1   Term  1   Preterm      AB      Living  1     SAB      TAB      Ectopic      Multiple  0   Live Births  1           Family History  Problem Relation Age of Onset  . Healthy Mother   . Healthy Father     Social History   Tobacco Use  . Smoking status: Current Every Day Smoker    Packs/day: 0.25    Types: Cigarettes  . Smokeless tobacco: Never Used  . Tobacco comment: only smoke a "couple" of cigarettes when stressed or anxious, socially with friends per [facility] chart  Vaping Use  . Vaping Use: Never used  Substance Use Topics  . Alcohol use: Not Currently    Comment: occasional prior to pregnancy  . Drug use: Not Currently    Types: Marijuana    Comment: reports  use 4 or 5 times; last used 05/04/19    Home Medications Prior to Admission medications   Medication Sig Start Date End Date Taking? Authorizing Provider  acetaminophen (TYLENOL) 500 MG tablet Take 1,000 mg by mouth every 6 (six) hours as needed for mild pain, moderate pain, fever or headache. Patient not taking: Reported on 04/28/2020    [provider]  Blood Pressure Monitoring (BLOOD PRESSURE KIT) DEVI 1 Device by Does not apply route as needed. Patient not taking: Reported on 04/28/2020 09/28/19   Caren Macadam, MD  docusate sodium (COLACE) 100 MG capsule Take 100 mg by mouth daily.  Patient not taking: Reported on 04/28/2020    [provider]  ferrous sulfate 325 (65 FE) MG tablet Take 325 mg by mouth daily with breakfast.    [provider]  folic acid (FOLVITE) 010 MCG tablet  Take 400 mcg by mouth daily.    [provider]  ibuprofen (ADVIL) 600 MG tablet Take 1 tablet (600 mg total) by mouth every 8 (eight) hours as needed for mild pain. Patient not taking: Reported on 04/28/2020 01/16/20   Simmons-Robinson, Riki Sheer, MD  lidocaine (XYLOCAINE) 2 % solution Use as directed 15 mLs in the mouth or throat every 3 (three) hours as needed for mouth pain (throat pain). 06/10/20   Kinnie Feil, PA-C  Prenatal Vit-Fe Fumarate-FA (MULTIVITAMIN-PRENATAL) 27-0.8 MG TABS tablet Take 1 tablet by mouth daily at 12 noon.    [provider]  QUEtiapine (SEROQUEL) 50 MG tablet Take 1 tablet (50 mg total) by mouth at bedtime. Dose reduced. Should have meds for now . Dose now is 51m qhs 05/16/20   AMerian Capron MD  sertraline (ZOLOFT) 50 MG tablet Take 1 tablet (50 mg total) by mouth daily. Refill when due. Dose increased today 05/16/20   AMerian Capron MD    Allergies    Ascorbate, Citrus, Coconut flavor, Lamotrigine, Orange (diagnostic), Peach flavor, Pear, and Pineapple  Review of Systems   Review of Systems  HENT: Positive for trouble swallowing.   All other systems reviewed and are negative.   Physical Exam Updated Vital Signs BP 114/67 (BP Location: Right Arm)   Pulse 76   Temp 98.4 F (36.9 C) (Oral)   Resp 16   SpO2 99%   Breastfeeding Unknown   Physical Exam Constitutional:      Appearance: She is well-developed.  HENT:     Head: Normocephalic.     Nose: Nose normal.     Mouth/Throat:     Tonsils: Tonsillar exudate present. 1+ on the right. 1+ on the left.     Comments: Tiny white exudate vs stone embedded in right tonsil. Tonsils are both slightly erythematous, edematous but symmetric.  Uvula midline. Normal oropharynx, sublingual space. No trismus. Normal tongue protrusion. Tolerating secretions. No stridor.  Eyes:     General: Lids are normal.  Neck:     Comments: No asymmetric AL neck edema, crepitus, tenderness. Trachea  midline. Cardiovascular:     Rate and Rhythm: Normal rate.  Pulmonary:     Effort: Pulmonary effort is normal. No respiratory distress.  Musculoskeletal:        General: Normal range of motion.     Cervical back: Normal range of motion.  Neurological:     Mental Status: She is alert.  Psychiatric:        Behavior: Behavior normal.     ED Results / Procedures / Treatments   Labs (all labs ordered  are listed, but only abnormal results are displayed) Labs Reviewed  COMPREHENSIVE METABOLIC PANEL - Abnormal; Notable for the following components:      Result Value   Glucose, Bld 100 (*)    BUN 5 (*)    All other components within normal limits  GROUP A STREP BY PCR  CBC WITH DIFFERENTIAL/PLATELET    EKG None  Radiology No results found.  Procedures Procedures (including critical care time)  Medications Ordered in ED Medications  acetaminophen (TYLENOL) tablet 1,000 mg (1,000 mg Oral Given 06/10/20 1452)  lidocaine (XYLOCAINE) 2 % viscous mouth solution 15 mL (15 mLs Mouth/Throat Given 06/10/20 1453)    ED Course  I have reviewed the triage vital signs and the nursing notes.  Pertinent labs & imaging results that were available during my care of the patient were reviewed by me and considered in my medical decision making (see chart for details).    MDM Rules/Calculators/A&P                          EMR, triage nursing notes reviewed.  Your work-up including CBC, CMP ordered by triage RN.  This was personally visualized and interpreted, normal.  No leukocytosis.  I ordered group B strep which is negative.  Clinical exam is most consistent with mild tonsillitis versus tonsil stone.  No red flags like fever, trismus, drooling, changes in voice.  She has what looks like is a small tonsil stone versus exudate on the right tonsil but this is very small.  No asymmetry of the tonsils.  Tolerating secretions here.  Exam is benign.  She has no other respiratory symptoms.  Doubt  Covid.  Suspect symptoms are from tonsil stone irritating her tonsil.  Recommended Tylenol, warm water/salt gargles, soft diet, lidocaine suspension.  Strict return precautions discussed, patient is aware of symptoms that warrant immediate return to the ED.  She has OB/GYN scheduled in 7 days, recommend follow-up with them.  Patient is in agreement. Final Clinical Impression(s) / ED Diagnoses Final diagnoses:  Tonsil stone    Rx / DC Orders ED Discharge Orders         Ordered    lidocaine (XYLOCAINE) 2 % solution  Every  3 hours PRN        06/10/20 1458           Kinnie Feil, PA-C 06/10/20 1601    Tegeler, Gwenyth Allegra, MD 06/10/20 1623

## 2020-06-10 NOTE — Discharge Instructions (Signed)
Your symptoms are most likely from a tonsil stone and irritated tonsil  I will call you with your strep test results and send in a prescription for antibiotics if this is positive  Take 500 to 1000 mg of acetaminophen every 6-8 hours for the next 3 to 5 days.  Use warm water and salt gargles as often as possible during the day.  Soft diet.  Lidocaine suspension has been ordered which can help with throat pain.  Discuss your symptoms with your OB/GYN at your next week's appointment.  Return to the ED for worsening or severe symptoms, fever greater than 100, worsening tonsil swelling, difficulty swallowing, changes in your voice, shortness of breath

## 2020-06-10 NOTE — ED Notes (Signed)
dc'd by provider

## 2020-06-10 NOTE — Telephone Encounter (Signed)
Patient seen if ED today for Tonsil Stones
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
## 2020-06-14 ENCOUNTER — Emergency Department (HOSPITAL_COMMUNITY): Payer: No Typology Code available for payment source

## 2020-06-14 ENCOUNTER — Emergency Department (HOSPITAL_COMMUNITY)
Admission: EM | Admit: 2020-06-14 | Discharge: 2020-06-16 | Disposition: A | Discharge status: Home / Self Care | Payer: No Typology Code available for payment source | Attending: Emergency Medicine | Admitting: Emergency Medicine

## 2020-06-14 ENCOUNTER — Encounter (HOSPITAL_COMMUNITY): Payer: Self-pay

## 2020-06-14 DIAGNOSIS — W19XXXA Unspecified fall, initial encounter: Secondary | ICD-10-CM

## 2020-06-14 DIAGNOSIS — Z20822 Contact with and (suspected) exposure to covid-19: Secondary | ICD-10-CM | POA: Insufficient documentation

## 2020-06-14 DIAGNOSIS — S92015A Nondisplaced fracture of body of left calcaneus, initial encounter for closed fracture: Secondary | ICD-10-CM | POA: Insufficient documentation

## 2020-06-14 DIAGNOSIS — Z8659 Personal history of other mental and behavioral disorders: Secondary | ICD-10-CM

## 2020-06-14 DIAGNOSIS — W1781XA Fall down embankment (hill), initial encounter: Secondary | ICD-10-CM | POA: Diagnosis not present

## 2020-06-14 DIAGNOSIS — O418X10 Other specified disorders of amniotic fluid and membranes, first trimester, not applicable or unspecified: Secondary | ICD-10-CM

## 2020-06-14 DIAGNOSIS — S92032A Displaced avulsion fracture of tuberosity of left calcaneus, initial encounter for closed fracture: Secondary | ICD-10-CM

## 2020-06-14 DIAGNOSIS — S92002A Unspecified fracture of left calcaneus, initial encounter for closed fracture: Secondary | ICD-10-CM

## 2020-06-14 DIAGNOSIS — Y9259 Other trade areas as the place of occurrence of the external cause: Secondary | ICD-10-CM | POA: Insufficient documentation

## 2020-06-14 DIAGNOSIS — J45909 Unspecified asthma, uncomplicated: Secondary | ICD-10-CM | POA: Diagnosis not present

## 2020-06-14 DIAGNOSIS — F332 Major depressive disorder, recurrent severe without psychotic features: Secondary | ICD-10-CM | POA: Diagnosis present

## 2020-06-14 DIAGNOSIS — Z3A11 11 weeks gestation of pregnancy: Secondary | ICD-10-CM | POA: Diagnosis not present

## 2020-06-14 DIAGNOSIS — Z3490 Encounter for supervision of normal pregnancy, unspecified, unspecified trimester: Secondary | ICD-10-CM

## 2020-06-14 DIAGNOSIS — M25572 Pain in left ankle and joints of left foot: Secondary | ICD-10-CM

## 2020-06-14 DIAGNOSIS — O2341 Unspecified infection of urinary tract in pregnancy, first trimester: Secondary | ICD-10-CM | POA: Diagnosis not present

## 2020-06-14 DIAGNOSIS — F32A Depression, unspecified: Secondary | ICD-10-CM | POA: Insufficient documentation

## 2020-06-14 DIAGNOSIS — R45851 Suicidal ideations: Secondary | ICD-10-CM | POA: Insufficient documentation

## 2020-06-14 DIAGNOSIS — F17210 Nicotine dependence, cigarettes, uncomplicated: Secondary | ICD-10-CM | POA: Diagnosis not present

## 2020-06-14 DIAGNOSIS — T7491XA Unspecified adult maltreatment, confirmed, initial encounter: Secondary | ICD-10-CM

## 2020-06-14 DIAGNOSIS — R109 Unspecified abdominal pain: Secondary | ICD-10-CM

## 2020-06-14 DIAGNOSIS — Z0471 Encounter for examination and observation following alleged adult physical abuse: Secondary | ICD-10-CM | POA: Diagnosis present

## 2020-06-14 LAB — URINALYSIS, ROUTINE W REFLEX MICROSCOPIC
Bilirubin Urine: NEGATIVE
Glucose, UA: NEGATIVE mg/dL
Hgb urine dipstick: NEGATIVE
Ketones, ur: 20 mg/dL — AB
Nitrite: NEGATIVE
Protein, ur: 30 mg/dL — AB
Specific Gravity, Urine: 1.024 (ref 1.005–1.030)
pH: 5 (ref 5.0–8.0)

## 2020-06-14 LAB — RESPIRATORY PANEL BY RT PCR (FLU A&B, COVID)
Influenza A by PCR: NEGATIVE
Influenza B by PCR: NEGATIVE
SARS Coronavirus 2 by RT PCR: NEGATIVE

## 2020-06-14 LAB — PREGNANCY, URINE: Preg Test, Ur: POSITIVE — AB

## 2020-06-14 MED ORDER — CEPHALEXIN 250 MG PO CAPS
500.0000 mg | ORAL_CAPSULE | Freq: Once | ORAL | Status: AC
  Administered 2020-06-14: 500 mg via ORAL
  Filled 2020-06-14: qty 2
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
## 2020-06-14 NOTE — Progress Notes (Signed)
Orthopedic Tech Progress Note Patient Details:  Sharon Ward 25-Mar-1996 [id number]  Ortho Devices Type of Ortho Device: CAM walker Ortho Device/Splint Location: lle Ortho Device/Splint Interventions: Ordered, Application, Adjustment   Post Interventions Patient Tolerated: Well Instructions Provided: Care of device, Adjustment of device   [name] Post 06/14/2020, 11:57 PM
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
## 2020-06-14 NOTE — ED Notes (Signed)
TTS consult monitor placed in pt room
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
## 2020-06-14 NOTE — ED Notes (Signed)
Registration informed this tech that patient is in peds with daughter

## 2020-06-15 ENCOUNTER — Emergency Department (HOSPITAL_COMMUNITY): Payer: No Typology Code available for payment source

## 2020-06-15 LAB — RAPID URINE DRUG SCREEN, HOSP PERFORMED
Amphetamines: NOT DETECTED
Barbiturates: NOT DETECTED
Benzodiazepines: NOT DETECTED
Cocaine: NOT DETECTED
Opiates: NOT DETECTED
Tetrahydrocannabinol: NOT DETECTED

## 2020-06-15 MED ORDER — ACETAMINOPHEN 325 MG PO TABS
650.0000 mg | ORAL_TABLET | Freq: Once | ORAL | Status: AC
  Administered 2020-06-15: 650 mg via ORAL
  Filled 2020-06-15: qty 2

## 2020-06-15 MED ORDER — ACETAMINOPHEN 500 MG PO TABS
1000.0000 mg | ORAL_TABLET | Freq: Once | ORAL | Status: AC
  Administered 2020-06-15: 1000 mg via ORAL
  Filled 2020-06-15: qty 2

## 2020-06-15 MED ORDER — NITROFURANTOIN MONOHYD MACRO 100 MG PO CAPS: 100.0000 mg | ORAL_CAPSULE | Freq: Two times a day (BID) | ORAL | 0 refills | Status: DC

## 2020-06-15 MED ORDER — CEPHALEXIN 250 MG PO CAPS
500.0000 mg | ORAL_CAPSULE | Freq: Once | ORAL | Status: AC
  Administered 2020-06-15: 500 mg via ORAL
  Filled 2020-06-15: qty 2

## 2020-06-15 NOTE — Progress Notes (Signed)
MSW Intern received call from patient's aunt Ursula Beath advising they have decided to come pick patient / baby up tonight; stated they are leaving [place] at about 2:00 pm, will come to the hospital to get patient's house key from nursing staff, go pick up patient's belongings from her apartment, return to the hospital and pick up patient/ baby for discharge.   MSW Intern and CSW notified ED RN of need for patient's keys to be given to aunt Ursula Beath prior to patient's discharge.   MSW Intern let patient know of conversation with her aunt regarding the keys. Patient stated she will make a list so that her aunt knows what to get.   MSW Intern notified team of pharmacy information for prescription ([facility] [address], [place], [place] [address]).

## 2020-06-15 NOTE — ED Notes (Signed)
Social work at bedside.  

## 2020-06-15 NOTE — ED Notes (Signed)
Pt currently sleeping at this time along w/ her baby. Will wait to do vitals.

## 2020-06-15 NOTE — ED Notes (Signed)
Hospital Baby crib delivered to pt room

## 2020-06-15 NOTE — ED Notes (Signed)
Report given to [facility], for continuation of care; pt resting quietly with baby resting in crib.

## 2020-06-15 NOTE — ED Notes (Signed)
Spoke with provider about pt care after discharge; chart being reviewed by provider and will be ready to print AVS by the time Aunt Scarlett returns to pick up patient.
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
## 2020-06-15 NOTE — ED Notes (Signed)
Family at bedside. 

## 2020-06-15 NOTE — ED Notes (Signed)
CM/SW at bedside.  
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
## 2020-06-16 LAB — URINE CULTURE: Culture: 40000 — AB

## 2020-06-16 NOTE — ED Notes (Signed)
Pt aunt arrived as dicussed for pt to be discharged. Aunt at bedside a this time.

## 2020-06-16 NOTE — ED Notes (Addendum)
Pt stable feeding baby at this time, when RN came in to access vitals, awaiting pt family (aunt) return for discharge. Pt ambulated self to pt restroom with boot in place, gait steady. This RN remains at bedside with infant in room until pt return.

## 2020-06-17 ENCOUNTER — Telehealth: Payer: Self-pay

## 2020-06-17 NOTE — Telephone Encounter (Signed)
Post ED Visit - Positive Culture Follow-up  Culture report reviewed by antimicrobial stewardship pharmacist: [facility] []  , Pharm.D. []  Enzo Bi, Pharm.D., BCPS AQ-ID []  , Pharm.D., BCPS []  Celedonio Miyamoto, Pharm.D., BCPS []  [place], Garvin Fila.D., BCPS, AAHIVP []  , Pharm.D., BCPS, AAHIVP []  Georgina Pillion, PharmD, BCPS []  , PharmD, BCPS []  [place], PharmD, BCPS []  [address], PharmD []  , PharmD, BCPS []  Estella Husk, PharmD [facility] []  Lysle Pearl, PharmD []  , PharmD []  Phillips Climes, PharmD []  , Rph []  Agapito Games) , PharmD []  Verlan Friends, PharmD []  , PharmD []  Mervyn Gay, PharmD []  , PharmD []  Vinnie Level, PharmD []  Manuela Schwartz, PharmD []  , PharmD []  Len Childs, PharmD   Positive urine culture Treated with MIcrobid, organism sensitive to the same and no further patient follow-up is required at this time.  06/17/2020, 9:22 AM

## 2020-06-18 ENCOUNTER — Encounter: Payer: Self-pay | Admitting: Student

## 2020-06-30 ENCOUNTER — Encounter (HOSPITAL_COMMUNITY): Payer: Self-pay | Admitting: Psychiatry

## 2020-06-30 ENCOUNTER — Ambulatory Visit (INDEPENDENT_AMBULATORY_CARE_PROVIDER_SITE_OTHER): Payer: No Typology Code available for payment source | Admitting: Psychiatry

## 2020-06-30 DIAGNOSIS — Z639 Problem related to primary support group, unspecified: Secondary | ICD-10-CM | POA: Diagnosis not present

## 2020-06-30 DIAGNOSIS — F411 Generalized anxiety disorder: Secondary | ICD-10-CM | POA: Diagnosis not present

## 2020-06-30 DIAGNOSIS — F3160 Bipolar disorder, current episode mixed, unspecified: Secondary | ICD-10-CM | POA: Diagnosis not present

## 2020-06-30 DIAGNOSIS — O0990 Supervision of high risk pregnancy, unspecified, unspecified trimester: Secondary | ICD-10-CM

## 2020-06-30 MED ORDER — SERTRALINE HCL 50 MG PO TABS: 50.0000 mg | ORAL_TABLET | Freq: Every day | ORAL | 1 refills | Status: DC

## 2020-06-30 MED ORDER — QUETIAPINE FUMARATE 50 MG PO TABS
50.0000 mg | ORAL_TABLET | Freq: Every day | ORAL | 1 refills | Status: DC
Start: 2020-06-30 — End: 2020-08-14
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
## 2020-08-06 ENCOUNTER — Other Ambulatory Visit: Payer: Self-pay

## 2020-08-06 ENCOUNTER — Ambulatory Visit (INDEPENDENT_AMBULATORY_CARE_PROVIDER_SITE_OTHER): Payer: Medicaid Other

## 2020-08-06 DIAGNOSIS — Z3202 Encounter for pregnancy test, result negative: Secondary | ICD-10-CM | POA: Diagnosis not present

## 2020-08-06 NOTE — Progress Notes (Signed)
Per chart review pt has a positive pregnancy test noted resulting on 06/14/20.  Pt states that she was pregnant and there was complications with pregnancy so she had decided to have an abortion which was on 07/02/20 in [place].  Pt reports that she came in for pregnancy test because she could not afford one in the stores.  Pt' pregnancy test resulted negative.  I informed pt results.  Pt begins to inform me that does not understand why her breast feel full and leaking milk.  Pt states that the abortion clinic told her that she may have symptom for 4-6 weeks after.  I explained to the pt that while her symptoms like bleeding may last for 4-6 weeks that her body takes time after being pregnant to get hormones stabilized which is why she could be leaking milk.  Pt begins to get upset and states "have you worked in an abortion clinic".  I informed the pt no I have not however I can share what to expect once her body is no longer pregnant.  Before I could finish pt hangs up the phone.    [name], RN

## 2020-08-07 ENCOUNTER — Encounter (HOSPITAL_COMMUNITY): Payer: Self-pay | Admitting: Family Medicine

## 2020-08-07 ENCOUNTER — Inpatient Hospital Stay (HOSPITAL_COMMUNITY)
Admission: AD | Admit: 2020-08-07 | Discharge: 2020-08-07 | Disposition: A | Discharge status: Home / Self Care | Payer: Medicaid Other | Attending: Family Medicine | Admitting: Family Medicine

## 2020-08-07 ENCOUNTER — Other Ambulatory Visit: Payer: Self-pay

## 2020-08-07 ENCOUNTER — Telehealth: Payer: Self-pay | Admitting: Family Medicine

## 2020-08-07 DIAGNOSIS — Z79899 Other long term (current) drug therapy: Secondary | ICD-10-CM | POA: Diagnosis not present

## 2020-08-07 DIAGNOSIS — Z8759 Personal history of other complications of pregnancy, childbirth and the puerperium: Secondary | ICD-10-CM | POA: Diagnosis not present

## 2020-08-07 DIAGNOSIS — Z720 Tobacco use: Secondary | ICD-10-CM | POA: Diagnosis not present

## 2020-08-07 DIAGNOSIS — Z3200 Encounter for pregnancy test, result unknown: Secondary | ICD-10-CM | POA: Diagnosis not present

## 2020-08-07 DIAGNOSIS — N939 Abnormal uterine and vaginal bleeding, unspecified: Secondary | ICD-10-CM

## 2020-08-07 LAB — POCT PREGNANCY, URINE: Preg Test, Ur: NEGATIVE

## 2020-08-07 LAB — HCG, QUANTITATIVE, PREGNANCY: hCG, Beta Chain, Quant, S: 5 m[IU]/mL — ABNORMAL HIGH (ref <5)

## 2020-08-07 NOTE — MAU Provider Note (Signed)
Faculty Practice OB/GYN Attending MAU Note  Chief Complaint: No chief complaint on file.    None     SUBJECTIVE Sharon Ward is a 24 y.o. G3P1001 at Unknown by LMP who presents with ? Pregnancy. Was seen at [facility] yesterday for pregnancy testing. Unclear about her results. Had TAB in Late November. Having some breast tenderness. Unprotected intercourse on 12/6 and on-going. Concerned for new pregnancy. Bleeding postprocedure stopped 12/9. Now with bleeding and cramping.   Past Medical History:  Diagnosis Date  . Anxiety   . Asthma   . Depression    OB History  Gravida Para Term Preterm AB Living  _0 SAB IAB Ectopic Multiple Live Births        0 1    # Outcome Date GA Lbr Len/2nd Weight Sex Delivery Anes PTL Lv  3 Gravida           2 Term 01/14/20 [id number] / 00:59 3351 g F Vag-Spont None  LIV  1 Gravida            Past Surgical History:  Procedure Laterality Date  . PILONIDAL CYST / SINUS EXCISION  09/11/2013  . PILONIDAL CYST EXCISION  05/17/2014   Pilonidal cystectomy with cleft lip   Social History   Socioeconomic History  . Marital status: Significant Other    Spouse name: Not on file  . Number of children: Not on file  . Years of education: Not on file  . Highest education level: Not on file  Occupational History  . Not on file  Tobacco Use  . Smoking status: Current Every Day Smoker    Packs/day: 0.25    Types: Cigarettes  . Smokeless tobacco: Never Used  . Tobacco comment: only smoke a "couple" of cigarettes when stressed or anxious, [facility]  Vaping Use  . Vaping Use: Never used  Substance and Sexual Activity  . Alcohol use: Not Currently    Comment: occasional prior to pregnancy  . Drug use: Not Currently    Types: Marijuana    Comment: reports use 4 or 5 times; last used 05/04/19  . Sexual activity: Yes    Partners: Male  Other Topics Concern  . Not on file  Social History Narrative  . Not on file   Social  Determinants of Health   Financial Resource Strain: Not on file  Food Insecurity: Food Insecurity Present  . Worried About RCharity fundraiserin the Last Year: Never true  . Ran Out of Food in the Last Year: Sometimes true  Transportation Needs: No Transportation Needs  . Lack of Transportation (Medical): No  . Lack of Transportation (Non-Medical): No  Physical Activity: Not on file  Stress: Not on file  Social Connections: Not on file  Intimate Partner Violence: Not on file   No current facility-administered medications on file prior to encounter.   Current Outpatient Medications on File Prior to Encounter  Medication Sig Dispense Refill  . Blood Pressure Monitoring (BLOOD PRESSURE KIT) DEVI 1 Device by Does not apply route as needed. (Patient not taking: Reported on 04/28/2020) 1 each 0  . ferrous sulfate 325 (65 FE) MG tablet Take 325 mg by mouth daily with breakfast.    . folic acid (FOLVITE) 4859MCG tablet Take 400 mcg by mouth daily.    .Marland Kitchenibuprofen (ADVIL) 600 MG tablet Take 1 tablet (600 mg total) by mouth every 8 (eight) hours  as needed for mild pain. (Patient not taking: Reported on 04/28/2020) 30 tablet 0  . lidocaine (XYLOCAINE) 2 % solution Use as directed 15 mLs in the mouth or throat every 3 (three) hours as needed for mouth pain (throat pain). 100 mL 0  . nitrofurantoin, macrocrystal-monohydrate, (MACROBID) 100 MG capsule Take 1 capsule (100 mg total) by mouth 2 (two) times daily. 10 capsule 0  . Prenatal Vit-Fe Fumarate-FA (MULTIVITAMIN-PRENATAL) 27-0.8 MG TABS tablet Take 1 tablet by mouth daily at 12 noon.    . QUEtiapine (SEROQUEL) 50 MG tablet Take 1 tablet (50 mg total) by mouth at bedtime. Dose reduced. Should have meds for now . Dose now is 62m qhs 30 tablet 1  . sertraline (ZOLOFT) 50 MG tablet Take 1 tablet (50 mg total) by mouth daily. Refill when due. Dose increased today 30 tablet 1   Allergies  Allergen Reactions  . Ascorbate Rash  . Citrus Rash  . Coconut  Flavor Rash  . Lamotrigine Rash  . Orange (Diagnostic) Rash  . Peach Flavor Rash  . Pear Rash  . Pineapple Rash    ROS: Pertinent items in HPI  OBJECTIVE BP (!) 112/55 (BP Location: Right Arm)   Pulse 62   Temp 97.8 F (36.6 C) (Oral)   Resp 18   LMP  (LMP Unknown)   SpO2 99%   Breastfeeding Unknown  CONSTITUTIONAL: Well-developed, well-nourished female in no acute distress.  HENT:  Normocephalic, atraumatic, External right and left ear normal.  EYES: Conjunctivae and EOM are normal.  No scleral icterus.  NECK: Normal range of motion, supple, no masses.  Normal thyroid.  SKIN: Skin is warm and dry. No rash noted. Not diaphoretic. No erythema. No pallor. [place] Alert and oriented to person, place, and time. Normal reflexes, muscle tone coordination.  PSYCHIATRIC: Normal mood and affect. Normal behavior. Normal judgment and thought content. CARDIOVASCULAR: Normal heart rate noted RESPIRATORY: Effort and breath sounds normal, no problems with respiration noted. ABDOMEN: Soft, normal bowel sounds, no distention noted.  No tenderness, rebound or guarding.  MUSCULOSKELETAL: Normal range of motion.   LAB RESULTS Results for orders placed or performed during the hospital encounter of 08/07/20 (from the past 48 hour(s))  Pregnancy, urine POC     Status: None   Collection Time: 08/07/20 11:43 AM  Result Value Ref Range   Preg Test, Ur NEGATIVE NEGATIVE    Comment:        THE SENSITIVITY OF THIS METHODOLOGY IS >24 mIU/mL   hCG, quantitative, pregnancy     Status: Abnormal   Collection Time: 08/07/20 11:57 AM  Result Value Ref Range   hCG, Beta Chain, Quant, S 5 (H) <5 mIU/mL    Comment:          GEST. AGE      CONC.  (mIU/mL)   <=1 WEEK        5 - 50     2 WEEKS       50 - 500     3 WEEKS       100 - 10,000     4 WEEKS     1,000 - 30,000     5 WEEKS     3,500 - 115,000   6-8 WEEKS     12,000 - 270,000    12 WEEKS     15,000 - 220,000        FEMALE AND NON-PREGNANT  FEMALE:     LESS THAN 5 mIU/mL Performed at [facility], [place] [address], [place], [place] [address]     IMAGING No results found.  MAU COURSE UPT negative Quant ordered.  ASSESSMENT 1. Vagina bleeding    Quant is 5  PLAN ? Is this going down, or new pregnancy-->will repeat in 48 hours  Called the patient and discussed by phone.  Discharge home  [place] for [place] at [facility] Follow up.   Specialty: Obstetrics and Gynecology Contact information: [place] [address] [phone]             Allergies as of 08/07/2020      Reactions   Ascorbate Rash   Citrus Rash   Coconut Flavor Rash   Lamotrigine Rash   Orange (diagnostic) Rash   Peach Flavor Rash   Pear Rash   Pineapple Rash      Medication List    TAKE these medications   Blood Pressure Kit Devi 1 Device by Does not apply route as needed.   ferrous sulfate 325 (65 FE) MG tablet Take 325 mg by mouth daily with breakfast.   folic acid 627 MCG tablet Commonly known as: FOLVITE Take 400 mcg by mouth daily.   ibuprofen 600 MG tablet Commonly known as: ADVIL Take 1 tablet (600 mg total) by mouth every 8 (eight) hours as needed for mild pain.   lidocaine 2 % solution Commonly known as: XYLOCAINE Use as directed 15 mLs in the mouth or throat every 3 (three) hours as needed for mouth pain (throat pain).   multivitamin-prenatal 27-0.8 MG Tabs tablet Take 1 tablet by mouth daily at 12 noon.   nitrofurantoin (macrocrystal-monohydrate) 100 MG capsule Commonly known as: MACROBID Take 1 capsule (100 mg total) by mouth 2 (two) times daily.   QUEtiapine 50 MG tablet Commonly known as: SEROQUEL Take 1 tablet (50 mg total) by mouth at bedtime. Dose reduced. Should have meds for now . Dose now is 47m qhs   sertraline 50 MG tablet Commonly known as: ZOLOFT Take 1 tablet (50 mg total) by mouth daily. Refill  when due. Dose increased today       Evaluation does not show pathology that would require ongoing emergent intervention or inpatient treatment. Patient is hemodynamically stable and mentating appropriately. Discussed findings and plan with patient, who agrees with care plan. All questions answered. Return precautions discussed and outpatient follow up recommendations given.  PDonnamae Jude MD 08/07/2020 1:08 PM

## 2020-08-07 NOTE — MAU Note (Signed)
Presents with c/o VB that began last night.  States +UPT on July 14, 2020.  States had n UPT @ [facility], states was initially informed test was negative, then told it was positive.  Reports had 2 +UPT last night @ home.   Reports had abortion 07/02/2020 in [place].

## 2020-08-07 NOTE — Telephone Encounter (Signed)
Attempted to reach patient about her appointment. She canceled, but did not get rescheduled. Need to know if she would like to be seen for her care.

## 2020-08-11 ENCOUNTER — Inpatient Hospital Stay (HOSPITAL_COMMUNITY)
Admission: AD | Admit: 2020-08-11 | Discharge: 2020-08-11 | Disposition: A | Discharge status: Home / Self Care | Payer: Medicaid Other | Attending: Family Medicine | Admitting: Family Medicine

## 2020-08-11 DIAGNOSIS — Z3202 Encounter for pregnancy test, result negative: Secondary | ICD-10-CM

## 2020-08-11 LAB — HCG, QUANTITATIVE, PREGNANCY: hCG, Beta Chain, Quant, S: 4 m[IU]/mL (ref <5)

## 2020-08-11 MED ORDER — NORGESTIMATE-ETH ESTRADIOL 0.25-35 MG-MCG PO TABS: 1.0000 | ORAL_TABLET | Freq: Every day | ORAL | 11 refills | Status: DC

## 2020-08-11 NOTE — MAU Note (Signed)
Didn't have a ride for Sat, bus wasn't running, tried to call, but was told closed. Is feeling all right, just tired. Has had bleeding off and on.  No bleeding yesterday, ? Blood/Tissue this morning when wiped.  Denies pain.  Is confused about what is going on.

## 2020-08-11 NOTE — MAU Provider Note (Signed)
Subjective:  Sharon Ward is a 25 y.o. G3P1001 at Unknown who presents today for FU BHCG. She was seen on 12/30 . Results from that day show no IUP on [place], and HCG 5. She did not keep her f/u.  She denies vaginal bleeding. She denies abdominal or pelvic pain.  Objective:  Physical Exam  Nursing note and vitals reviewed. Constitutional: She is oriented to person, place, and time. She appears well-developed and well-nourished. No distress.  HENT:  Head: Normocephalic.  Cardiovascular: Normal rate.  Respiratory: Effort normal.  GI: Soft. There is no tenderness.  Neurological: She is alert and oriented to person, place, and time. Skin: Skin is warm and dry.  Psychiatric: She has a normal mood and affect.   Results for orders placed or performed during the hospital encounter of 08/11/20 (from the past 24 hour(s))  hCG, quantitative, pregnancy     Status: None   Collection Time: 08/11/20 12:25 PM  Result Value Ref Range   hCG, Beta Chain, Quant, S 4 <5 mIU/mL    Assessment/Plan: Pregnancy of unknown location HCG did not rise appropriately; Quant today 4 FU as needed for OB emergencies Patient requests BC: RX sprintec   Duane Lope, NP 08/11/2020 6:27 PM]

## 2020-08-13 ENCOUNTER — Telehealth (HOSPITAL_COMMUNITY): Payer: Self-pay | Admitting: Psychiatry

## 2020-08-13 DIAGNOSIS — O0990 Supervision of high risk pregnancy, unspecified, unspecified trimester: Secondary | ICD-10-CM

## 2020-08-13 NOTE — Telephone Encounter (Signed)
It was clear in last appointment of her discharge since she has moved and plans to follow up with providers there. If she insist to follow up back with [place] and has moved here we can re instate her .Sharon Ward can send her refills due for now and make appointment within couple of weeks unless urgent

## 2020-08-13 NOTE — Telephone Encounter (Signed)
Patient calling.   She states she is almost out of medication and needs an appointment to see you. She is back in [place] and not in [place] anymore. She wants to continue to see you. She states at the last visit Dr. Gilmore Laroche told her to follow up in four weeks. I informed her that in the last visit it is documented that she was discharged due to her moving to [place] and she would be finding a local psychiatrist there.  She states that is the wrong information.   Please advise on what we should be doing.   CB# [phone] She is aware dr. Gilmore Laroche is out of the office today.

## 2020-08-14 MED ORDER — SERTRALINE HCL 50 MG PO TABS: 50.0000 mg | ORAL_TABLET | Freq: Every day | ORAL | 0 refills | Status: DC

## 2020-08-14 MED ORDER — QUETIAPINE FUMARATE 50 MG PO TABS: 50.0000 mg | ORAL_TABLET | Freq: Every day | ORAL | 0 refills | Status: DC

## 2020-08-14 NOTE — Telephone Encounter (Signed)
Sent pt a one month supply of both meds and made a appt. Nothing further is needed at this time

## 2020-08-20 ENCOUNTER — Encounter: Payer: Medicaid Other | Admitting: Family Medicine

## 2020-08-21 ENCOUNTER — Telehealth (HOSPITAL_COMMUNITY): Payer: No Typology Code available for payment source | Admitting: Psychiatry

## 2020-08-21 ENCOUNTER — Other Ambulatory Visit: Payer: Self-pay

## 2020-08-21 ENCOUNTER — Telehealth: Payer: Medicaid Other | Admitting: Physician Assistant

## 2020-08-21 NOTE — Progress Notes (Deleted)
Patient ID: Sharon Ward, female   DOB: 06-18-1996, 25 y.o.   MRN: [id number]

## 2020-08-25 ENCOUNTER — Other Ambulatory Visit (HOSPITAL_COMMUNITY): Payer: Self-pay

## 2020-08-25 ENCOUNTER — Other Ambulatory Visit: Payer: Self-pay

## 2020-08-25 ENCOUNTER — Ambulatory Visit (HOSPITAL_COMMUNITY)
Admission: RE | Admit: 2020-08-25 | Discharge: 2020-08-25 | Disposition: A | Discharge status: Home / Self Care | Payer: Medicaid Other | Source: Ambulatory Visit | Attending: Nurse Practitioner | Admitting: Nurse Practitioner

## 2020-08-25 ENCOUNTER — Ambulatory Visit: Payer: Medicaid Other | Attending: Physician Assistant | Admitting: Nurse Practitioner

## 2020-08-25 ENCOUNTER — Other Ambulatory Visit: Payer: Self-pay | Admitting: Nurse Practitioner

## 2020-08-25 ENCOUNTER — Encounter: Payer: Self-pay | Admitting: Nurse Practitioner

## 2020-08-25 ENCOUNTER — Telehealth: Payer: Self-pay | Admitting: Nurse Practitioner

## 2020-08-25 DIAGNOSIS — S82892G Other fracture of left lower leg, subsequent encounter for closed fracture with delayed healing: Secondary | ICD-10-CM | POA: Insufficient documentation

## 2020-08-25 DIAGNOSIS — Z7689 Persons encountering health services in other specified circumstances: Secondary | ICD-10-CM

## 2020-08-25 DIAGNOSIS — F319 Bipolar disorder, unspecified: Secondary | ICD-10-CM | POA: Diagnosis not present
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
## 2020-08-25 NOTE — Telephone Encounter (Signed)
Pt is at Radiology and they stated they need an order for the pts Xray/ please advise asap

## 2020-08-27 ENCOUNTER — Inpatient Hospital Stay (HOSPITAL_COMMUNITY)
Admission: AD | Admit: 2020-08-27 | Discharge: 2020-08-27 | Disposition: A | Discharge status: Home / Self Care | Payer: Medicaid Other | Attending: Obstetrics and Gynecology | Admitting: Obstetrics and Gynecology

## 2020-08-27 ENCOUNTER — Other Ambulatory Visit: Payer: Self-pay

## 2020-08-27 DIAGNOSIS — Z3202 Encounter for pregnancy test, result negative: Secondary | ICD-10-CM | POA: Insufficient documentation

## 2020-08-27 DIAGNOSIS — Z332 Encounter for elective termination of pregnancy: Secondary | ICD-10-CM

## 2020-08-27 DIAGNOSIS — N912 Amenorrhea, unspecified: Secondary | ICD-10-CM | POA: Diagnosis present

## 2020-08-27 LAB — POCT PREGNANCY, URINE: Preg Test, Ur: NEGATIVE

## 2020-08-27 NOTE — MAU Provider Note (Signed)
Medical Screening Exam  Sharon Ward is a 25 y.o. G3P1001 who present to MAU today for pregnancy confirmation. She denies abdominal pain or vaginal bleeding.   BP 118/60 (BP Location: Right Arm)   Pulse 70   Temp 98.6 F (37 C)   Resp 16   Ht 5\' 3"  (1.6 m)   Wt 83.3 kg   LMP 08/07/2020   SpO2 99%   BMI 32.52 kg/m  CONSTITUTIONAL: Well-developed, well-nourished female in no acute distress.  CARDIOVASCULAR: Normal heart rate noted RESPIRATORY: Effort and breath sounds normal GASTROINTESTINAL:Soft, no distention noted.  No tenderness, rebound or guarding.  SKIN: Skin is warm and dry. No rash noted. Not diaphoretic. No erythema. No pallor. PSYCHIATRIC: Normal mood and affect. Normal behavior. Normal judgment and thought content.  MDM Medical screening exam complete Patient does not endorse any symptoms concerning for ectopic pregnancy or pregnancy related complication today.   Results for orders placed or performed during the hospital encounter of 08/27/20 (from the past 24 hour(s))  Pregnancy, urine POC     Status: None   Collection Time: 08/27/20  1:54 PM  Result Value Ref Range   Preg Test, Ur NEGATIVE NEGATIVE    A:  Amenorrhea after termination in Nov 2021  P: Discharge home Patient has negative UPT-- no unprotected sex in the past 2 weeks. Encouraged appointment at [facility] for contraceptive counseling.  Reasons to return to MAU reviewed  Patient may return to MAU as needed or if her condition were to change or worsen  [facility], MD 08/27/2020 2:48 PM

## 2020-08-27 NOTE — Discharge Instructions (Signed)
Prenatal Care Providers           Center for [facility] @ [facility]  [address] [phone]  Center for [facility] @ [facility]   [address]  [phone]  [facility] @ [facility]       [address] [phone]            Center for [facility] @ [place]     [phone]          Center for [facility] @ [facility]   [address] [phone]  [facility] for [facility] @ [facility]  [address] [phone]     Center for [facility] @ [address] [place])  [address]   [phone]     [facility]  Phone: [phone]  [facility]  Phone: [phone]  [facility] Phone: [phone]  [facility] Phone: [phone]  [facility] Phone: [phone]  [facility] Phone: [phone]  [facility]  Phone: [phone]    Levonorgestrel intrauterine device (IUD) What is this medicine? LEVONORGESTREL IUD (LEE voe nor jes trel) is a contraceptive (birth control) device. The device is placed inside the uterus by a health care provider. It is used to prevent pregnancy. Some devices can also be used to treat heavy bleeding that occurs during your period. This medicine may be used for other purposes; ask your health care provider or pharmacist if you have questions. COMMON BRAND NAME(S): Cameron Ali What should I tell my health care provider before I take this medicine? They need to know if you have any of these conditions:  abnormal Pap smear  cancer of the breast, uterus, or cervix  diabetes  endometritis  genital or pelvic infection now or in the past  have more than one sexual partner or your partner has more than one partner  heart disease  history of an ectopic or  tubal pregnancy  immune system problems  IUD in place  liver disease or tumor  problems with blood clots or take blood-thinners  seizures  use intravenous drugs  uterus of unusual shape  vaginal bleeding that has not been explained  an unusual or allergic reaction to levonorgestrel, other hormones, silicone, or polyethylene, medicines, foods, dyes, or preservatives  pregnant or trying to get pregnant  breast-feeding How should I use this medicine? This device is placed inside the uterus by a health care professional. Talk to your pediatrician regarding the use of this medicine in children. Special care may be needed. Overdosage: If you think you have taken too much of this medicine contact a poison control center or emergency room at once. NOTE: This medicine is only for you. Do not share this medicine with others. What if I miss a dose? This does not apply. Depending on the brand of device you have inserted, the device will need to be replaced every 3 to 7 years if you wish to continue using this type of birth control. What may interact with this medicine? Do not take this medicine with any of the following medications:  amprenavir  bosentan  fosamprenavir This medicine may also interact with the following medications:  aprepitant  armodafinil  barbiturate medicines for inducing sleep or treating seizures  bexarotene  boceprevir  griseofulvin  medicines to treat seizures like carbamazepine, ethotoin, felbamate, oxcarbazepine, phenytoin, topiramate  modafinil  pioglitazone  rifabutin  rifampin  rifapentine  some medicines to treat HIV infection like atazanavir, efavirenz, indinavir, lopinavir, nelfinavir, tipranavir, ritonavir  St. John's wort  warfarin This list may not describe all possible interactions. Give your health care provider a list of all the medicines, herbs, non-prescription drugs, or dietary supplements you use. Also tell them if you  smoke, drink alcohol, or use illegal drugs. Some items may interact with your medicine. What should I watch for while using this medicine? Visit your doctor or health care professional for regular check ups. See your doctor if you or your partner has sexual contact with others, becomes HIV positive, or gets a sexual transmitted disease. This product does not protect you against HIV infection (AIDS) or other sexually transmitted diseases. You can check the placement of the IUD yourself by reaching up to the top of your vagina with clean fingers to feel the threads. Do not pull on the threads. It is a good habit to check placement after each menstrual period. Call your doctor right away if you feel more of the IUD than just the threads or if you cannot feel the threads at all. The IUD may come out by itself. You may become pregnant if the device comes out. If you notice that the IUD has come out use a backup birth control method like condoms and call your health care provider. Using tampons will not change the position of the IUD and are okay to use during your period. This IUD can be safely scanned with magnetic resonance imaging (MRI) only under specific conditions. Before you have an MRI, tell your healthcare provider that you have an IUD in place, and which type of IUD you have in place. What side effects may I notice from receiving this medicine? Side effects that you should report to your doctor or health care professional as soon as possible:  allergic reactions like skin rash, itching or hives, swelling of the face, lips, or tongue  fever, flu-like symptoms  genital sores  high blood pressure  no menstrual period for 6 weeks during use  pain, swelling, warmth in the leg  pelvic pain or tenderness  severe or sudden headache  signs of pregnancy  stomach cramping  sudden shortness of breath  trouble with balance, talking, or walking  unusual vaginal bleeding,  discharge  yellowing of the eyes or skin Side effects that usually do not require medical attention (report to your doctor or health care professional if they continue or are bothersome):  acne  breast pain  change in sex drive or performance  changes in weight  cramping, dizziness, or faintness while the device is being inserted  headache  irregular menstrual bleeding within first 3 to 6 months of use  nausea This list may not describe all possible side effects. Call your doctor for medical advice about side effects. You may report side effects to FDA at 1-800-FDA-1088. Where should I keep my medicine? This does not apply. NOTE: This sheet is a summary. It may not cover all possible information. If you have questions about this medicine, talk to your doctor, pharmacist, or health care provider.  2021 Elsevier/Gold Standard (2020-03-25 16:27:45)

## 2020-08-27 NOTE — MAU Note (Signed)
Sharon Ward is a 25 y.o.  here in MAU reporting: was told to come back here to have a pregnancy test done and wants to make sure everything is okay since her abortion in November. Denies pain and bleeding.  Pain score: 0/10  Vitals:   08/27/20 1333  BP: 118/60  Pulse: 70  Resp: 16  Temp: 98.6 F (37 C)  SpO2: 99%     Lab orders placed from triage: none
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
## 2020-09-01 ENCOUNTER — Telehealth: Payer: Self-pay | Admitting: Family Medicine

## 2020-09-01 NOTE — Telephone Encounter (Signed)
Pt states that she needs to discuss thick mucus like  discharge  that she notice today. Request a call ASAP. thanks

## 2020-09-01 NOTE — Telephone Encounter (Signed)
Call returned to pt and heard a message stating that the person called has a voice mailbox which has not been set up yet.

## 2020-09-02 ENCOUNTER — Telehealth: Payer: Self-pay | Admitting: Family Medicine

## 2020-09-02 NOTE — Telephone Encounter (Signed)
PT called back and states her voicemail is not working and could you please call her back . thanks  Original message.. Pt states that she needs to discuss thick mucus like  discharge  that she notice today. Request a call ASAP. thanks

## 2020-09-03 ENCOUNTER — Other Ambulatory Visit: Payer: Self-pay | Admitting: Nurse Practitioner

## 2020-09-03 ENCOUNTER — Other Ambulatory Visit: Payer: Medicaid Other

## 2020-09-03 ENCOUNTER — Telehealth (INDEPENDENT_AMBULATORY_CARE_PROVIDER_SITE_OTHER): Payer: No Typology Code available for payment source | Admitting: Psychiatry

## 2020-09-03 ENCOUNTER — Encounter (HOSPITAL_COMMUNITY): Payer: Self-pay | Admitting: Psychiatry

## 2020-09-03 DIAGNOSIS — F3160 Bipolar disorder, current episode mixed, unspecified: Secondary | ICD-10-CM | POA: Diagnosis not present

## 2020-09-03 DIAGNOSIS — R7309 Other abnormal glucose: Secondary | ICD-10-CM

## 2020-09-03 DIAGNOSIS — O0990 Supervision of high risk pregnancy, unspecified, unspecified trimester: Secondary | ICD-10-CM

## 2020-09-03 DIAGNOSIS — F411 Generalized anxiety disorder: Secondary | ICD-10-CM | POA: Diagnosis not present

## 2020-09-03 DIAGNOSIS — Z639 Problem related to primary support group, unspecified: Secondary | ICD-10-CM

## 2020-09-03 DIAGNOSIS — Z8659 Personal history of other mental and behavioral disorders: Secondary | ICD-10-CM

## 2020-09-03 MED ORDER — QUETIAPINE FUMARATE 50 MG PO TABS: 50.0000 mg | ORAL_TABLET | Freq: Every day | ORAL | 0 refills | Status: DC

## 2020-09-03 MED ORDER — SERTRALINE HCL 50 MG PO TABS: 75.0000 mg | ORAL_TABLET | Freq: Every day | ORAL | 0 refills | Status: DC

## 2020-09-03 NOTE — Telephone Encounter (Signed)
Called patient, no answer- unable to leave message as voicemail was not set up. 
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
## 2020-09-04 ENCOUNTER — Ambulatory Visit (INDEPENDENT_AMBULATORY_CARE_PROVIDER_SITE_OTHER): Payer: Medicaid Other

## 2020-09-04 ENCOUNTER — Other Ambulatory Visit: Payer: Self-pay

## 2020-09-04 DIAGNOSIS — Z3202 Encounter for pregnancy test, result negative: Secondary | ICD-10-CM

## 2020-09-04 DIAGNOSIS — F32A Depression, unspecified: Secondary | ICD-10-CM

## 2020-09-04 DIAGNOSIS — F3160 Bipolar disorder, current episode mixed, unspecified: Secondary | ICD-10-CM

## 2020-09-04 DIAGNOSIS — Z658 Other specified problems related to psychosocial circumstances: Secondary | ICD-10-CM

## 2020-09-04 LAB — POCT PREGNANCY, URINE: Preg Test, Ur: NEGATIVE

## 2020-09-04 NOTE — Progress Notes (Signed)
Patient was assessed and managed by nursing staff during this encounter. I have reviewed the chart and agree with the documentation and plan. I have also made any necessary editorial changes.  Lawrence A Bass, MD 09/04/2020 11:50 AM   
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
## 2020-09-08 NOTE — BH Specialist Note (Signed)
Pt did not arrive to video visit and did not answer the phone ; Left HIPPA-compliant message to call back Namiyah Grantham from [facility] at [phone] (main office) or [phone] (Ayako Tapanes's office).  ; left MyChart message for patient.      

## 2020-09-11 ENCOUNTER — Encounter: Payer: Self-pay | Admitting: Family Medicine

## 2020-09-11 ENCOUNTER — Ambulatory Visit (INDEPENDENT_AMBULATORY_CARE_PROVIDER_SITE_OTHER): Payer: Medicaid Other | Admitting: Family Medicine

## 2020-09-11 ENCOUNTER — Other Ambulatory Visit: Payer: Self-pay

## 2020-09-11 ENCOUNTER — Other Ambulatory Visit (HOSPITAL_COMMUNITY)
Admission: RE | Admit: 2020-09-11 | Discharge: 2020-09-11 | Disposition: A | Discharge status: Home / Self Care | Payer: Medicaid Other | Source: Ambulatory Visit | Attending: Family Medicine | Admitting: Family Medicine

## 2020-09-11 VITALS — BP 126/68 | HR 62 | Ht 63.0 in | Wt 181.0 lb

## 2020-09-11 DIAGNOSIS — Z30430 Encounter for insertion of intrauterine contraceptive device: Secondary | ICD-10-CM | POA: Diagnosis not present

## 2020-09-11 DIAGNOSIS — Z113 Encounter for screening for infections with a predominantly sexual mode of transmission: Secondary | ICD-10-CM | POA: Insufficient documentation

## 2020-09-11 DIAGNOSIS — Z3202 Encounter for pregnancy test, result negative: Secondary | ICD-10-CM | POA: Diagnosis not present

## 2020-09-11 LAB — POCT PREGNANCY, URINE: Preg Test, Ur: NEGATIVE

## 2020-09-11 MED ORDER — PARAGARD INTRAUTERINE COPPER IU IUD
INTRAUTERINE_SYSTEM | Freq: Once | INTRAUTERINE | Status: AC
  Administered 2020-09-11: 1 via INTRAUTERINE

## 2020-09-11 NOTE — Patient Instructions (Signed)
Intrauterine Device Insertion, Care After This sheet gives you information about how to care for yourself after your procedure. Your health care provider may also give you more specific instructions. If you have problems or questions, contact your health care provider. What can I expect after the procedure? After the procedure, it is common to have:  Cramps and pain in the abdomen.  Bleeding. It may be light or heavy. This may last for a few days.  Lower back pain.  Dizziness.  Headaches.  Nausea. Follow these instructions at home:  Before resuming sexual activity, check to make sure that you can feel the IUD string or strings. You should be able to feel the end of the string below the opening of your cervix. If your IUD string is in place, you may resume sexual activity. ? If you had a hormonal IUD inserted more than 7 days after your most recent period started, you will need to use a backup method of birth control for 7 days after IUD insertion. Ask your health care provider whether this applies to you.  Continue to check that the IUD is still in place by feeling for the strings after every menstrual period, or once a month.  An IUD will not protect you from sexually transmitted infections (STIs). Use methods to prevent the exchange of body fluids between partners (barrier protection) every time you have sex. Barrier protection can be used during oral, vaginal, or anal sex. Commonly used barrier methods include: ? Female condom. ? Female condom. ? Dental dam.  Take over-the-counter and prescription medicines only as told by your health care provider.  Keep all follow-up visits as told by your health care provider. This is important.   Contact a health care provider if:  You feel light-headed or weak.  You have any of the following problems with your IUD string or strings: ? The string bothers or hurts you or your sexual partner. ? You cannot feel the string. ? The string has  gotten longer.  You can feel the IUD in your vagina.  You think you may be pregnant, or you miss your menstrual period.  You think you may have a sexually transmitted infection (STI). Get help right away if:  You have flu-like symptoms, such as tiredness (fatigue) and muscle aches.  You have a fever and chills.  You have bleeding that is heavier or lasts longer than a normal menstrual cycle.  You have abnormal or bad-smelling discharge from your vagina.  You develop abdominal pain that is new, is getting worse, or is not in the same area of earlier cramping and pain.  You have pain during sexual activity. Summary  After the procedure, it is common to have cramps and pain in the abdomen. It is also common to have light bleeding or heavier bleeding that is like your menstrual period.  Continue to check that the IUD is still in place by feeling for the strings after every menstrual period, or once a month.  Keep all follow-up visits as told by your health care provider. This is important.  Contact your health care provider if you have problems with your IUD strings, such as the string getting longer or bothering you or your sexual partner. This information is not intended to replace advice given to you by your health care provider. Make sure you discuss any questions you have with your health care provider. Document Revised: 07/17/2019 Document Reviewed: 07/17/2019 Elsevier Patient Education  2021 Elsevier Inc.  

## 2020-09-12 ENCOUNTER — Encounter: Payer: Self-pay | Admitting: Family Medicine

## 2020-09-12 LAB — CERVICOVAGINAL ANCILLARY ONLY
Chlamydia: NEGATIVE
Comment: NEGATIVE
Comment: NEGATIVE
Comment: NORMAL
Neisseria Gonorrhea: NEGATIVE
Trichomonas: NEGATIVE

## 2020-09-12 NOTE — Progress Notes (Signed)
    GYNECOLOGY OFFICE PROCEDURE NOTE  Sharon Ward is a 25 y.o. G3P1001 here for Paragard IUD insertion. No GYN concerns.  Last pap smear was on 05/24/2019 and was LGSIL.  IUD Insertion Procedure Note Patient identified, informed consent performed, consent signed.   Discussed risks of irregular bleeding, cramping, infection. Also discussed >99% contraception efficacy, increased risk of ectopic pregnancy with failure of method.   Time out was performed.  Urine pregnancy test negative.  Speculum placed in the vagina.  Cervix visualized.  Cleaned with Betadine x 2.  Grasped anteriorly with a single tooth tenaculum.   Paragard IUD placed per manufacturer's recommendations.  Strings trimmed to 3 cm. Tenaculum was removed, good hemostasis noted.  Patient tolerated procedure well.   Patient was given post-procedure instructions.  She was advised to have backup contraception for one week.  Patient was also asked to check IUD strings periodically and follow up in 4 weeks for IUD check and pap smear.   Reva Bores 09/12/2020 10:27 AM

## 2020-09-15 ENCOUNTER — Encounter: Payer: Self-pay | Admitting: Obstetrics & Gynecology

## 2020-09-15 ENCOUNTER — Other Ambulatory Visit: Payer: Self-pay

## 2020-09-15 ENCOUNTER — Ambulatory Visit (INDEPENDENT_AMBULATORY_CARE_PROVIDER_SITE_OTHER): Payer: Medicaid Other | Admitting: Obstetrics & Gynecology

## 2020-09-15 VITALS — BP 111/76 | HR 71 | Ht 63.0 in | Wt 180.4 lb

## 2020-09-15 DIAGNOSIS — T8332XA Displacement of intrauterine contraceptive device, initial encounter: Secondary | ICD-10-CM | POA: Diagnosis not present

## 2020-09-15 DIAGNOSIS — N921 Excessive and frequent menstruation with irregular cycle: Secondary | ICD-10-CM | POA: Diagnosis not present

## 2020-09-15 DIAGNOSIS — N719 Inflammatory disease of uterus, unspecified: Secondary | ICD-10-CM

## 2020-09-15 DIAGNOSIS — Z30432 Encounter for removal of intrauterine contraceptive device: Secondary | ICD-10-CM | POA: Diagnosis not present

## 2020-09-15 DIAGNOSIS — R7309 Other abnormal glucose: Secondary | ICD-10-CM

## 2020-09-15 LAB — CBC
Hematocrit: 43.2 % (ref 34.0–46.6)
Hemoglobin: 14.8 g/dL (ref 11.1–15.9)
MCH: 31.2 pg (ref 26.6–33.0)
MCHC: 34.3 g/dL (ref 31.5–35.7)
MCV: 91 fL (ref 79–97)
Platelets: 252 10*3/uL (ref 150–450)
RBC: 4.74 x10E6/uL (ref 3.77–5.28)
RDW: 12.6 % (ref 11.7–15.4)
WBC: 4.6 10*3/uL (ref 3.4–10.8)

## 2020-09-15 MED ORDER — DOXYCYCLINE HYCLATE 100 MG PO CAPS: 100.0000 mg | ORAL_CAPSULE | Freq: Two times a day (BID) | ORAL | 0 refills | Status: AC

## 2020-09-15 NOTE — Progress Notes (Signed)
Pt states here today for feeling IUD strings since last night. " States hanging on edge of vagina:" Pt states having abd cramps, changing pad every 2-3 hours.   Pt has PUS scheduled for 2/17@ 1100am. Pt aware of appt and agreeable.

## 2020-09-15 NOTE — Progress Notes (Signed)
GYNECOLOGY  VISIT  CC:   Can feel iud strings, heavier bleeding, cramping  HPI: 25 y.o. G3P1001 Significant Other White or Caucasian female here for concerns about IUD string.  Paragard IUD placed 09/11/2020.  Review of chart shows placement was straightforward.  Pt reports over the weekend she started to have increased bleeding.  It has been heavy at times with passing clots.  She is cramping a lot as well.  This morning, she could feel the IUD strings right at the vagina and did not think this was right so called.  She denies fever.  Concerned about placement.  GYNECOLOGIC HISTORY: Patient's last menstrual period was 09/05/2020 (exact date). Contraception: IUD  Patient Active Problem List   Diagnosis Date Noted  . Excess weight gain in pregnancy 10/25/2019  . Obesity affecting pregnancy 09/28/2019  . Anxiety 09/18/2019  . Asthma 09/18/2019  . Bipolar disorder (HCC) 09/18/2019  . Depression 09/18/2019  . LGSIL on Pap smear of cervix 09/18/2019  . Oppositional defiant disorder 09/18/2019  . H/O psychiatric hospitalization 09/18/2019  . Seasonal allergies 09/18/2019  . Suicidal ideation 09/18/2019    Past Medical History:  Diagnosis Date  . Asthma   . Bipolar 1 disorder, mixed (HCC)   . Depression   . Generalized anxiety disorder   . Relationship dysfunction     Past Surgical History:  Procedure Laterality Date  . PILONIDAL CYST / SINUS EXCISION  09/11/2013  . PILONIDAL CYST EXCISION  05/17/2014   Pilonidal cystectomy with cleft lip    MEDS:   Current Outpatient Medications on File Prior to Visit  Medication Sig Dispense Refill  . Fe Bisgly-Succ-C-Thre-B12-FA (IRON-150 PO) Take by mouth.    . Prenatal Vit-Fe Fumarate-FA (MULTIVITAMIN-PRENATAL) 27-0.8 MG TABS tablet Take 1 tablet by mouth daily at 12 noon.    . QUEtiapine (SEROQUEL) 50 MG tablet Take 1 tablet (50 mg total) by mouth at bedtime. Dose reduced. Should have meds for now . Dose now is 50mg  qhs 30 tablet 0  .  sertraline (ZOLOFT) 50 MG tablet Take 1.5 tablets (75 mg total) by mouth daily. Refill when due. Dose increased today 45 tablet 0   No current facility-administered medications on file prior to visit.    ALLERGIES: Ascorbate, Citrus, Coconut flavor, Lamotrigine, Orange (diagnostic), Peach flavor, Pear, and Pineapple  Family History  Problem Relation Age of Onset  . Healthy Mother   . Healthy Father     SH:  Has significant other, smoker  Review of Systems  Respiratory: Negative.   Cardiovascular: Negative.   Gastrointestinal: Negative.   Genitourinary: Positive for menstrual problem.  All other systems reviewed and are negative.   PHYSICAL EXAMINATION:    BP 111/76   Pulse 71   Ht 5\' 3"  (1.6 m)   Wt 180 lb 6.4 oz (81.8 kg)   LMP 09/05/2020 (Exact Date)   BMI 31.96 kg/m     General appearance: alert, cooperative and appears stated age Lymph:  no inguinal LAD noted  Pelvic: External genitalia:  no lesions              Urethra:  normal appearing urethra with no masses, tenderness or lesions              Bartholins and Skenes: normal                 Vagina: normal appearing vagina with normal color and discharge, no lesions  Cervix: no lesions, blood at os, iud stem noted at os and iud string was 6+cm in length and right at introitus              Bimanual Exam:  Uterus:  regular contour, soft and tender on motion              Adnexa: no mass, fullness, tenderness  Procedure:  With visible stem of IUD noted, placement is not correct.  Recommended IUD removal.  Verbal consent obtained.  Pt agreed with plan.  IUD string grasped with single toothed tenaculum and removed with one pull.  IUD was intact and discarded.  Pt tolerated procedure well.  Chaperone, Gershon Crane, RN, was present for exam.  Assessment/Plan: 1. Malpositioned intrauterine device (IUD), initial encounter - IUD removed today.  Prior to replacment, will order PUS to assess for any uterine  anomaly as IUD placement appears to have been very straightforward. - US PELVIS (TRANSABDOMINAL ONLY); Future  2. Encounter for IUD removal - pt will start back OCPs in case decides she  3. Menorrhagia with irregular cycle (most likely due to malpositioned IUD)  4. Endometritis (uterus tender on exam today - CBC - doxycycline (VIBRAMYCIN) 100 MG capsule; Take 1 capsule (100 mg total) by mouth 2 (two) times daily for 7 days.  Dispense: 14 capsule; Refill: 0 - recheck 10-14 days prior to IUD replacement

## 2020-09-16 ENCOUNTER — Ambulatory Visit: Payer: Medicaid Other | Admitting: Certified Nurse Midwife

## 2020-09-16 ENCOUNTER — Other Ambulatory Visit: Payer: Medicaid Other

## 2020-09-16 ENCOUNTER — Encounter (HOSPITAL_BASED_OUTPATIENT_CLINIC_OR_DEPARTMENT_OTHER): Payer: Self-pay

## 2020-09-16 LAB — HEMOGLOBIN A1C
Est. average glucose Bld gHb Est-mCnc: 94 mg/dL
Hgb A1c MFr Bld: 4.9 % (ref 4.8–5.6)

## 2020-09-16 LAB — HEPATITIS C ANTIBODY: Hep C Virus Ab: 0.2 s/co ratio (ref 0.0–0.9)

## 2020-09-16 LAB — HEPATITIS B SURFACE ANTIGEN: Hepatitis B Surface Ag: NEGATIVE

## 2020-09-16 LAB — HIV ANTIBODY (ROUTINE TESTING W REFLEX): HIV Screen 4th Generation wRfx: NONREACTIVE

## 2020-09-16 LAB — RPR: RPR Ser Ql: NONREACTIVE

## 2020-09-17 ENCOUNTER — Telehealth: Payer: Self-pay | Admitting: Obstetrics & Gynecology

## 2020-09-17 NOTE — Telephone Encounter (Signed)
Called pt to respond to her mychart message.  She thought her Hep C testing was negative from 09/16/2020.  Reassured pt, it was negative.  She did have a positive hep C antibody test on 07/02/2019 but the Hep C RNA quant was negative so this was also a negative test.  Pt reassured by this and appreciated phone call.

## 2020-09-22 ENCOUNTER — Ambulatory Visit: Payer: No Typology Code available for payment source | Admitting: Clinical

## 2020-09-22 DIAGNOSIS — Z91199 Patient's noncompliance with other medical treatment and regimen due to unspecified reason: Secondary | ICD-10-CM

## 2020-09-22 DIAGNOSIS — Z5329 Procedure and treatment not carried out because of patient's decision for other reasons: Secondary | ICD-10-CM

## 2020-09-25 ENCOUNTER — Other Ambulatory Visit: Payer: Self-pay | Admitting: Obstetrics & Gynecology

## 2020-09-25 ENCOUNTER — Ambulatory Visit
Admission: RE | Admit: 2020-09-25 | Discharge: 2020-09-25 | Disposition: A | Discharge status: Home / Self Care | Payer: Medicaid Other | Source: Ambulatory Visit | Attending: Obstetrics & Gynecology | Admitting: Obstetrics & Gynecology

## 2020-09-25 ENCOUNTER — Other Ambulatory Visit: Payer: Self-pay

## 2020-09-25 ENCOUNTER — Encounter (HOSPITAL_BASED_OUTPATIENT_CLINIC_OR_DEPARTMENT_OTHER): Payer: Self-pay

## 2020-09-25 DIAGNOSIS — T8332XA Displacement of intrauterine contraceptive device, initial encounter: Secondary | ICD-10-CM | POA: Diagnosis present

## 2020-09-29 ENCOUNTER — Other Ambulatory Visit: Payer: Self-pay

## 2020-09-29 ENCOUNTER — Ambulatory Visit (HOSPITAL_COMMUNITY)
Admission: EM | Admit: 2020-09-29 | Discharge: 2020-09-29 | Disposition: A | Discharge status: Home / Self Care | Payer: Medicaid Other

## 2020-09-29 ENCOUNTER — Encounter (HOSPITAL_COMMUNITY): Payer: Self-pay

## 2020-09-29 DIAGNOSIS — S92002G Unspecified fracture of left calcaneus, subsequent encounter for fracture with delayed healing: Secondary | ICD-10-CM | POA: Diagnosis not present

## 2020-09-29 NOTE — Discharge Instructions (Signed)
-  Continue to wear your boot as directed -Elevate your foot at the end of the day, use heat, ibuprofen for pain -Call orthopedist to schedule follow-up. Information below.
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
## 2020-10-03 ENCOUNTER — Other Ambulatory Visit: Payer: Self-pay

## 2020-10-03 ENCOUNTER — Ambulatory Visit: Payer: Medicaid Other | Admitting: Family Medicine

## 2020-10-03 DIAGNOSIS — M79672 Pain in left foot: Secondary | ICD-10-CM

## 2020-10-03 MED ORDER — MELOXICAM 15 MG PO TABS: ORAL_TABLET | ORAL | 0 refills | Status: DC

## 2020-10-03 NOTE — Patient Instructions (Signed)
It was great to meet you today! Thank you for letting me participate in your care!  Today, we discussed your ankle fracture and I think it is best to come out of the boot and wear the ASO ankle brace. Please wear it when active. Please take Meloxicam once per day with food and I will see you in 4 weeks. Over the next 2 weeks start with the ankle range of motion exercises and then progress to the band exercises.   Be well, [name], DO PGY-4, Sports Medicine Fellow [facility]
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
## 2020-10-04 DIAGNOSIS — M79672 Pain in left foot: Secondary | ICD-10-CM | POA: Insufficient documentation

## 2020-10-04 NOTE — Assessment & Plan Note (Signed)
From previous x-ray the avulsion fracture of the left calcaneus was still not healed and she has been in and out of the boot for a long time.  She is been wearing it mostly and I believe some of her current discomfort and symptoms are coming from staying in the boot too long.  She has been doing no rehab.  We will take her out of the boot today and begin home exercises starting with simple range of motion and ABCs of the ankle.  Then we will progress to band exercises after a couple of weeks.  She can take meloxicam for pain once a day with food over the next 10 days schedule and then as needed.  I am hopeful that taking something consistent moving the ankle and doing exercises will help with some of her symptoms.  In terms of stability and helpful walking encouraged her to wear cushioned shoes and gave her an ASO brace.  I will see her back in 4 weeks.

## 2020-10-05 NOTE — Progress Notes (Signed)
[facility]: Attending Note: I have reviewed the chart, discussed wit the Sports Medicine Fellow. I agree with assessment and treatment plan as detailed in the Fellow's note.  

## 2020-10-09 ENCOUNTER — Ambulatory Visit (INDEPENDENT_AMBULATORY_CARE_PROVIDER_SITE_OTHER): Payer: Medicaid Other | Admitting: Lactation Services

## 2020-10-09 ENCOUNTER — Other Ambulatory Visit: Payer: Self-pay

## 2020-10-09 ENCOUNTER — Ambulatory Visit: Payer: Medicaid Other | Admitting: Advanced Practice Midwife

## 2020-10-09 DIAGNOSIS — Z3202 Encounter for pregnancy test, result negative: Secondary | ICD-10-CM

## 2020-10-09 NOTE — Progress Notes (Signed)
Patient wanted a pregnancy test as she has not resumed her period since the IUD was taken out. She has had unprotected sex x 2 on 2/12 and 3/2. UPT negative. She plans to continue OCP until she can get her IUD replaced after her next period. She resumed her OCP's last week.

## 2020-10-09 NOTE — Progress Notes (Signed)
ATTESTATION OF SUPERVISION OF RN: Evaluation and management procedures were performed by the RN under my supervision and collaboration. I have reviewed the nursing note and chart and agree with the management and plan for this patient.  Devereaux Grayson, CNM  

## 2020-10-10 ENCOUNTER — Telehealth (HOSPITAL_COMMUNITY): Payer: No Typology Code available for payment source | Admitting: Psychiatry

## 2020-10-15 ENCOUNTER — Telehealth (HOSPITAL_COMMUNITY): Payer: Self-pay | Admitting: Psychiatry

## 2020-10-15 NOTE — Telephone Encounter (Signed)
Patient called to request refills on medications.  She missed are new apt with [facility]  Informed patient that she has been discharged by Dr. Gilmore Laroche and has already been given 67m supply of medications. He would not be able to fill another rx for her.  Informed her she could go to [facility] to be seen and they would be able to do an assessment. They are open 24/7.  Patient then used vulger language and stated she was going to slit her wrist and no one cared about her, And then disconnected the phone.   She was not calling from her number but a friends number. The CMA called patient back. Informed her that with the threats she made we would need to do a well check with the sheriffs office. She then stated we did not need to do this. She was saying those things out of anger. And then disconnected the phone again.

## 2020-10-24 ENCOUNTER — Emergency Department (HOSPITAL_COMMUNITY)
Admission: EM | Admit: 2020-10-24 | Discharge: 2020-10-25 | Disposition: A | Discharge status: Other Acute Inpatient Hospital | Payer: Medicaid Other | Attending: Emergency Medicine | Admitting: Emergency Medicine

## 2020-10-24 ENCOUNTER — Other Ambulatory Visit: Payer: Self-pay | Admitting: Psychiatry

## 2020-10-24 ENCOUNTER — Other Ambulatory Visit: Payer: Self-pay

## 2020-10-24 ENCOUNTER — Encounter (HOSPITAL_COMMUNITY): Payer: Self-pay

## 2020-10-24 ENCOUNTER — Inpatient Hospital Stay (HOSPITAL_COMMUNITY)
Admission: AD | Admit: 2020-10-24 | Payer: No Typology Code available for payment source | Source: Intra-hospital | Admitting: Psychiatry

## 2020-10-24 DIAGNOSIS — Z20822 Contact with and (suspected) exposure to covid-19: Secondary | ICD-10-CM | POA: Diagnosis not present

## 2020-10-24 DIAGNOSIS — F17210 Nicotine dependence, cigarettes, uncomplicated: Secondary | ICD-10-CM | POA: Diagnosis not present

## 2020-10-24 DIAGNOSIS — J45909 Unspecified asthma, uncomplicated: Secondary | ICD-10-CM | POA: Diagnosis not present

## 2020-10-24 DIAGNOSIS — F332 Major depressive disorder, recurrent severe without psychotic features: Secondary | ICD-10-CM | POA: Diagnosis present

## 2020-10-24 DIAGNOSIS — T6594XA Toxic effect of unspecified substance, undetermined, initial encounter: Secondary | ICD-10-CM | POA: Diagnosis not present

## 2020-10-24 LAB — CBC WITH DIFFERENTIAL/PLATELET
Abs Immature Granulocytes: 0.03 10*3/uL (ref 0.00–0.07)
Basophils Absolute: 0 10*3/uL (ref 0.0–0.1)
Basophils Relative: 0 %
Eosinophils Absolute: 0 10*3/uL (ref 0.0–0.5)
Eosinophils Relative: 1 %
HCT: 39.3 % (ref 36.0–46.0)
Hemoglobin: 13.3 g/dL (ref 12.0–15.0)
Immature Granulocytes: 0 %
Lymphocytes Relative: 17 %
Lymphs Abs: 1.5 10*3/uL (ref 0.7–4.0)
MCH: 32.1 pg (ref 26.0–34.0)
MCHC: 33.8 g/dL (ref 30.0–36.0)
MCV: 94.9 fL (ref 80.0–100.0)
Monocytes Absolute: 0.6 10*3/uL (ref 0.1–1.0)
Monocytes Relative: 7 %
Neutro Abs: 6.6 10*3/uL (ref 1.7–7.7)
Neutrophils Relative %: 75 %
Platelets: 211 10*3/uL (ref 150–400)
RBC: 4.14 MIL/uL (ref 3.87–5.11)
RDW: 12.5 % (ref 11.5–15.5)
WBC: 8.8 10*3/uL (ref 4.0–10.5)
nRBC: 0 % (ref 0.0–0.2)

## 2020-10-24 LAB — ACETAMINOPHEN LEVEL: Acetaminophen (Tylenol), Serum: <10 ug/mL — ABNORMAL LOW (ref 10–30)

## 2020-10-24 LAB — BASIC METABOLIC PANEL
Anion gap: 11 (ref 5–15)
BUN: 13 mg/dL (ref 6–20)
CO2: 22 mmol/L (ref 22–32)
Calcium: 9.2 mg/dL (ref 8.9–10.3)
Chloride: 104 mmol/L (ref 98–111)
Creatinine, Ser: 0.65 mg/dL (ref 0.44–1.00)
GFR, Estimated: >60 mL/min (ref >60)
Glucose, Bld: 95 mg/dL (ref 70–99)
Potassium: 3.6 mmol/L (ref 3.5–5.1)
Sodium: 137 mmol/L (ref 135–145)

## 2020-10-24 LAB — CBG MONITORING, ED: Glucose-Capillary: 92 mg/dL (ref 70–99)

## 2020-10-24 LAB — RESP PANEL BY RT-PCR (FLU A&B, COVID) ARPGX2
Influenza A by PCR: NEGATIVE
Influenza B by PCR: NEGATIVE
SARS Coronavirus 2 by RT PCR: NEGATIVE

## 2020-10-24 LAB — RAPID URINE DRUG SCREEN, HOSP PERFORMED
Amphetamines: NOT DETECTED
Barbiturates: NOT DETECTED
Benzodiazepines: NOT DETECTED
Cocaine: NOT DETECTED
Opiates: NOT DETECTED
Tetrahydrocannabinol: NOT DETECTED

## 2020-10-24 LAB — I-STAT BETA HCG BLOOD, ED (MC, WL, AP ONLY): I-stat hCG, quantitative: <5 m[IU]/mL (ref <5)

## 2020-10-24 LAB — ETHANOL: Alcohol, Ethyl (B): <10 mg/dL (ref <10)

## 2020-10-24 LAB — SALICYLATE LEVEL: Salicylate Lvl: <7 mg/dL — ABNORMAL LOW (ref 7.0–30.0)

## 2020-10-24 MED ORDER — NICOTINE 21 MG/24HR TD PT24
21.0000 mg | MEDICATED_PATCH | Freq: Every day | TRANSDERMAL | Status: DC
  Administered 2020-10-24 – 2020-10-25 (×2): 21 mg via TRANSDERMAL
  Filled 2020-10-24 (×2): qty 1

## 2020-10-24 MED ORDER — ONDANSETRON HCL 4 MG PO TABS: 4.0000 mg | ORAL_TABLET | Freq: Three times a day (TID) | ORAL | Status: DC | PRN
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
## 2020-10-24 NOTE — ED Triage Notes (Signed)
Pt arrives EMS from home after ingesting unknown amount of Sertraline 50mg  tabs and quetiapine tabs.   Meds were refilled on 10/20/20. 21 missing sertraline and 9 missing quetiapine.   Hx of postpartum depression, and depression. Unsure if intent to harm self.

## 2020-10-24 NOTE — ED Provider Notes (Signed)
Patient is medically clear for further psychiatric evaluation and treatment.   Wynetta Fines, MD 10/24/20 1452

## 2020-10-24 NOTE — ED Notes (Addendum)
Call received from [facility] [phone] requesting rtn call from pt when possible. [employer]

## 2020-10-24 NOTE — BH Assessment (Addendum)
[facility] Assessment Progress Note  Per Berneice Heinrich, NP this voluntary pt requires psychiatric hospitalization at this time. Pt is currently under consideration for admission to [facility] with final disposition pending as of this writing.  Please note that pt meets criteria for IVC if necessary.  EDP Kristine Royal and pt's nurse, Nash Dimmer, have been notified.  Doylene Canning, [place] [facility] Coordinator [phone]   Addendum:  Per Tosin, RN, [facility] pt has been pre-accepted to [facility], but they will not be ready to receive pt until tomorrow (10/25/20).  Pt will go to Rm [id number].  [facility] tomorrow will coordinate time.  Pt has reluctantly signed Voluntary Admission and Consent for Treatment, as well as Consent to Release Information to her fiance, Lawanda Cousins, and signed forms have been faxed to [facility].  EDP Cherlynn Perches, MD and pt's nurse, Nash Dimmer, have been notified.  Pt's nurse will send original paperwork along with pt via [employer], and call report to [phone].  Per pt's request, this writer has called Micah and notified him of pt's disposition.  Doylene Canning, [place] [facility] Coordinator [phone]

## 2020-10-24 NOTE — ED Notes (Signed)
I gave patient a breakfast tray 

## 2020-10-24 NOTE — ED Notes (Signed)
Seizure pads applied to pt's side rails.  Side rails up x2, bed locked and in lowest position.  Continuous vs monitoring in place.

## 2020-10-24 NOTE — ED Notes (Addendum)
Pt belongings in cabinet 23-25

## 2020-10-24 NOTE — ED Notes (Signed)
Call to [place] poison control recommendations  Labs including toxicology give 1 liter bolus 0.9nss monitor EKG and repeat 12 lead  Before pt is cleared a monitor for tachycardia and prolonged QTc if > 500 replace K+ and Mg+ to high levels of normal  . Pt may have N/V and sedation also increased risk of seizures treat with benzo's EDP to be informed.
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
## 2020-10-24 NOTE — ED Notes (Signed)
Call from poison control [place] vitals signs , ekg, and labs reviewed another than allowing pt to rest until LOC improves. Dr Read Drivers

## 2020-10-24 NOTE — ED Notes (Signed)
Pt denies SI/HI, pt stating she did not try to harm or kill herself.  Pt stating that she took the medications tonight out of anger and that it was an impulse decision not an attempt to end her life. Pt a&ox4, drowsy with slurred speech. Following commands.

## 2020-10-25 ENCOUNTER — Inpatient Hospital Stay (HOSPITAL_COMMUNITY)
Admission: RE | Admit: 2020-10-25 | Discharge: 2020-10-28 | DRG: 885 | Disposition: A | Discharge status: Home / Self Care | Payer: No Typology Code available for payment source | Attending: Psychiatry | Admitting: Psychiatry

## 2020-10-25 ENCOUNTER — Encounter (HOSPITAL_COMMUNITY): Payer: Self-pay | Admitting: Psychiatry

## 2020-10-25 ENCOUNTER — Other Ambulatory Visit: Payer: Self-pay

## 2020-10-25 DIAGNOSIS — F411 Generalized anxiety disorder: Secondary | ICD-10-CM | POA: Diagnosis present

## 2020-10-25 DIAGNOSIS — Z62810 Personal history of physical and sexual abuse in childhood: Secondary | ICD-10-CM | POA: Diagnosis present

## 2020-10-25 DIAGNOSIS — G4700 Insomnia, unspecified: Secondary | ICD-10-CM | POA: Diagnosis present

## 2020-10-25 DIAGNOSIS — F319 Bipolar disorder, unspecified: Secondary | ICD-10-CM | POA: Diagnosis present

## 2020-10-25 DIAGNOSIS — F17210 Nicotine dependence, cigarettes, uncomplicated: Secondary | ICD-10-CM | POA: Diagnosis present

## 2020-10-25 DIAGNOSIS — Z9151 Personal history of suicidal behavior: Secondary | ICD-10-CM

## 2020-10-25 DIAGNOSIS — J45909 Unspecified asthma, uncomplicated: Secondary | ICD-10-CM | POA: Diagnosis present

## 2020-10-25 DIAGNOSIS — F332 Major depressive disorder, recurrent severe without psychotic features: Secondary | ICD-10-CM | POA: Diagnosis present

## 2020-10-25 DIAGNOSIS — T6594XA Toxic effect of unspecified substance, undetermined, initial encounter: Secondary | ICD-10-CM | POA: Diagnosis not present

## 2020-10-25 DIAGNOSIS — F3130 Bipolar disorder, current episode depressed, mild or moderate severity, unspecified: Secondary | ICD-10-CM | POA: Diagnosis present

## 2020-10-25 MED ORDER — QUETIAPINE FUMARATE 50 MG PO TABS: 50.0000 mg | ORAL_TABLET | Freq: Every day | ORAL | Status: DC

## 2020-10-25 MED ORDER — ACETAMINOPHEN 325 MG PO TABS: 650.0000 mg | ORAL_TABLET | Freq: Four times a day (QID) | ORAL | Status: DC | PRN

## 2020-10-25 MED ORDER — NICOTINE 21 MG/24HR TD PT24
21.0000 mg | MEDICATED_PATCH | Freq: Every day | TRANSDERMAL | Status: DC
  Filled 2020-10-25 (×2): qty 1

## 2020-10-25 MED ORDER — SERTRALINE HCL 50 MG PO TABS: 75.0000 mg | ORAL_TABLET | Freq: Every day | ORAL | Status: DC

## 2020-10-25 MED ORDER — HYDROXYZINE HCL 25 MG PO TABS: 25.0000 mg | ORAL_TABLET | Freq: Three times a day (TID) | ORAL | Status: DC | PRN

## 2020-10-25 MED ORDER — MAGNESIUM HYDROXIDE 400 MG/5ML PO SUSP: 30.0000 mL | Freq: Every day | ORAL | Status: DC | PRN

## 2020-10-25 MED ORDER — SERTRALINE HCL 50 MG PO TABS
75.0000 mg | ORAL_TABLET | Freq: Every day | ORAL | Status: DC
  Administered 2020-10-26 – 2020-10-27 (×2): 75 mg via ORAL
  Filled 2020-10-25 (×3): qty 1

## 2020-10-25 MED ORDER — ALUM & MAG HYDROXIDE-SIMETH 200-200-20 MG/5ML PO SUSP: 30.0000 mL | ORAL | Status: DC | PRN

## 2020-10-25 MED ORDER — ONDANSETRON HCL 4 MG PO TABS: 4.0000 mg | ORAL_TABLET | Freq: Three times a day (TID) | ORAL | Status: DC | PRN

## 2020-10-25 MED ORDER — SERTRALINE HCL 50 MG PO TABS
75.0000 mg | ORAL_TABLET | Freq: Every day | ORAL | Status: DC
  Administered 2020-10-25: 75 mg via ORAL
  Filled 2020-10-25 (×2): qty 1

## 2020-10-25 MED ORDER — QUETIAPINE FUMARATE 50 MG PO TABS
50.0000 mg | ORAL_TABLET | Freq: Every day | ORAL | Status: DC
  Administered 2020-10-25 – 2020-10-27 (×3): 50 mg via ORAL
  Filled 2020-10-25 (×5): qty 1

## 2020-10-25 MED ORDER — TRAZODONE HCL 50 MG PO TABS: 50.0000 mg | ORAL_TABLET | Freq: Every evening | ORAL | Status: DC | PRN

## 2020-10-25 NOTE — ED Notes (Signed)
Transport call  To transport patient to [facility].

## 2020-10-25 NOTE — Tx Team (Signed)
Initial Treatment Plan 10/25/2020 6:36 PM Sharon Ward [id number]    PATIENT STRESSORS: Legal issue Marital or family conflict   PATIENT STRENGTHS: Average or above average intelligence Communication skills Motivation for treatment/growth Physical Health   PATIENT IDENTIFIED PROBLEMS:  Impulsivity (intentional OD)     "I need to find things to help me to cope"     "overwhelmed"             DISCHARGE CRITERIA:  Improved stabilization in mood, thinking, and/or behavior Reduction of life-threatening or endangering symptoms to within safe limits Verbal commitment to aftercare and medication compliance  PRELIMINARY DISCHARGE PLAN: Outpatient therapy Return to previous living arrangement  PATIENT/FAMILY INVOLVEMENT: This treatment plan has been presented to and reviewed with the patient, Sharon Ward, .  The patient has been given the opportunity to ask questions and make suggestions.  Shela Nevin, RN 10/25/2020, 6:36 PM

## 2020-10-25 NOTE — BH Assessment (Signed)
Per Hassie Bruce, RN pt has been accepted to [facility] and assigned to room/bed: 307-1. Pt can come after 0800. Attending physician: Dr. Jola Babinski. Disposition discussed with Riki Rusk, RN via secure chat in [place].     Redmond Pulling, MS, [facility] Triage Specialist [phone]

## 2020-10-25 NOTE — ED Provider Notes (Signed)
Emergency Medicine Observation Re-evaluation Note  Sharon Ward is a 25 y.o. female, seen on rounds today.  Pt initially presented to the ED for complaints of Ingestion Currently, the patient is medically stable.  She has been accepted to [facility] behavioral health unit.  Physical Exam  BP 120/76 (BP Location: Left Arm)   Pulse 75   Temp 98.2 F (36.8 C) (Oral)   Resp 20   Ht 5\' 3"  (1.6 m)   Wt 79.4 kg   SpO2 98%   BMI 31.01 kg/m  Physical Exam General: Well-appearing.  In bed coloring  Lungs: no respiratory distress Psych: anxious but appropriate  ED Course / MDM  EKG:EKG Interpretation  Date/Time:  Friday October 24 2020 06:17:47 EDT Ventricular Rate:  73 PR Interval:    QRS Duration: 95 QT Interval:  419 QTC Calculation: 462 R Axis:   11 Text Interpretation: Sinus rhythm Rate is slower Confirmed by Molpus, John (02-20-1992) on 10/24/2020 6:31:11 AM   I have reviewed the labs performed to date as well as medications administered while in observation.   Plan  Current plan is for inpatient hospitalization. Patient is not under full IVC at this time.   10/26/2020, MD 10/25/20 0730

## 2020-10-25 NOTE — Progress Notes (Signed)
   10/25/20 2225  Psych Admission Type (Psych Patients Only)  Admission Status Voluntary  Psychosocial Assessment  Patient Complaints Anxiety  Eye Contact Fair  Facial Expression Flat  Affect Appropriate to circumstance  Speech Logical/coherent  Interaction Assertive  Motor Activity Other (Comment) (WDL)  Appearance/Hygiene Unremarkable  Behavior Characteristics Appropriate to situation  Mood Anxious;Pleasant  Thought Process  Coherency WDL  Content WDL  Delusions None reported or observed  Perception WDL  Hallucination None reported or observed  Judgment Impaired  Confusion None  Danger to Self  Current suicidal ideation? Denies  Danger to Others  Danger to Others None reported or observed

## 2020-10-25 NOTE — Progress Notes (Signed)
   10/25/20 2221  COVID-19 Daily Checkoff  Have you had a fever (temp > 37.80C/100F)  in the past 24 hours?  No  If you have had runny nose, nasal congestion, sneezing in the past 24 hours, has it worsened? No  COVID-19 EXPOSURE  Have you traveled outside the state in the past 14 days? No  Have you been in contact with someone with a confirmed diagnosis of COVID-19 or PUI in the past 14 days without wearing appropriate PPE? No  Have you been living in the same home as a person with confirmed diagnosis of COVID-19 or a PUI (household contact)? No  Have you been diagnosed with COVID-19? No
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
## 2020-10-25 NOTE — ED Notes (Signed)
Report given to [facility] and they want patient to come at 1 PM.

## 2020-10-25 NOTE — ED Notes (Signed)
ED TO INPATIENT HANDOFF REPORT  ED Nurse Name and Phone #: [phone]  S Name/Age/Gender Sharon Ward 25 y.o. female Room/Bed: WHALC/WHALC  Code Status   Code Status: Full Code  Home/SNF/Other Home Patient oriented to: self, place, time and situation Is this baseline? Yes   Triage Complete: Triage complete  Chief Complaint Ingestion  Triage Note Pt arrives EMS from home after ingesting unknown amount of Sertraline 50mg  tabs and quetiapine tabs.   Meds were refilled on 10/20/20. 21 missing sertraline and 9 missing quetiapine.   Hx of postpartum depression, and depression. Unsure if intent to harm self.     Allergies Allergies  Allergen Reactions  . Ascorbate Rash  . Citrus Rash  . Coconut Flavor Rash  . Lamotrigine Rash  . Orange (Diagnostic) Rash  . Peach Flavor Rash  . Pear Rash  . Pineapple Rash    Level of Care/Admitting Diagnosis ED Disposition    None      B Medical/Surgery History Past Medical History:  Diagnosis Date  . Asthma   . Bipolar 1 disorder, mixed (HCC)   . Depression   . Generalized anxiety disorder   . Relationship dysfunction    Past Surgical History:  Procedure Laterality Date  . PILONIDAL CYST / SINUS EXCISION  09/11/2013  . PILONIDAL CYST EXCISION  05/17/2014   Pilonidal cystectomy with cleft lip     A IV Location/Drains/Wounds Patient Lines/Drains/Airways Status    Active Line/Drains/Airways    None          Intake/Output Last 24 hours  Intake/Output Summary (Last 24 hours) at 10/25/2020 0934 Last data filed at 10/25/2020 10/27/2020 Gross per 24 hour  Intake 1080 ml  Output --  Net 1080 ml    Labs/Imaging Results for orders placed or performed during the hospital encounter of 10/24/20 (from the past 48 hour(s))  CBG monitoring, ED     Status: None   Collection Time: 10/24/20  1:25 AM  Result Value Ref Range   Glucose-Capillary 92 70 - 99 mg/dL    Comment: Glucose reference range applies only to samples taken  after fasting for at least 8 hours.  Ethanol     Status: None   Collection Time: 10/24/20  1:36 AM  Result Value Ref Range   Alcohol, Ethyl (B) <10 <10 mg/dL    Comment: (NOTE) Lowest detectable limit for serum alcohol is 10 mg/dL.  For medical purposes only. Performed at [facility], [address], [place], [place]   Basic metabolic panel     Status: None   Collection Time: 10/24/20  1:36 AM  Result Value Ref Range   Sodium 137 135 - 145 mmol/L   Potassium 3.6 3.5 - 5.1 mmol/L   Chloride 104 98 - 111 mmol/L   CO2 22 22 - 32 mmol/L   Glucose, Bld 95 70 - 99 mg/dL    Comment: Glucose reference range applies only to samples taken after fasting for at least 8 hours.   BUN 13 6 - 20 mg/dL   Creatinine, Ser 10/26/20 0.44 - 1.00 mg/dL   Calcium 9.2 8.9 - 9.39 mg/dL   GFR, Estimated 03.0 >09 mL/min    Comment: (NOTE) Calculated using the CKD-EPI Creatinine Equation (2021)    Anion gap 11 5 - 15    Comment: Performed at [facility], [address], [place], [place]  Acetaminophen level     Status: Abnormal   Collection Time: 10/24/20  1:36 AM  Result Value Ref Range   Acetaminophen (Tylenol), Serum <10 (L) 10 - 30 ug/mL    Comment: (NOTE) Therapeutic concentrations vary significantly. A range of 10-30 ug/mL  may be an effective concentration for many patients. However, some  are best treated at concentrations outside of this range. Acetaminophen concentrations >150 ug/mL at 4 hours after ingestion  and >50 ug/mL at 12 hours after ingestion are often associated with  toxic reactions.  Performed at [facility], [address], [place], [place] [address]   Salicylate level     Status: Abnormal   Collection Time: 10/24/20  1:36 AM  Result Value Ref Range   Salicylate Lvl <7.0 (L) 7.0 - 30.0 mg/dL    Comment: Performed at [facility], [address], [place], [place] [address]  I-Stat  Beta hCG blood, ED ([facility], [facility], [facility] only)     Status: None   Collection Time: 10/24/20  1:58 AM  Result Value Ref Range   I-stat hCG, quantitative <5.0 <5 mIU/mL   Comment 3            Comment:   GEST. AGE      CONC.  (mIU/mL)   <=1 WEEK        5 - 50     2 WEEKS       50 - 500     3 WEEKS       100 - 10,000     4 WEEKS     1,000 - 30,000        FEMALE AND NON-PREGNANT FEMALE:     LESS THAN 5 mIU/mL   CBC with Differential/Platelet     Status: None   Collection Time: 10/24/20  2:30 AM  Result Value Ref Range   WBC 8.8 4.0 - 10.5 K/uL   RBC 4.14 3.87 - 5.11 MIL/uL   Hemoglobin 13.3 12.0 - 15.0 g/dL   HCT 21.3 08.6 - 57.8 %   MCV 94.9 80.0 - 100.0 fL   MCH 32.1 26.0 - 34.0 pg   MCHC 33.8 30.0 - 36.0 g/dL   RDW 46.9 62.9 - 52.8 %   Platelets 211 150 - 400 K/uL   nRBC 0.0 0.0 - 0.2 %   Neutrophils Relative % 75 %   Neutro Abs 6.6 1.7 - 7.7 K/uL   Lymphocytes Relative 17 %   Lymphs Abs 1.5 0.7 - 4.0 K/uL   Monocytes Relative 7 %   Monocytes Absolute 0.6 0.1 - 1.0 K/uL   Eosinophils Relative 1 %   Eosinophils Absolute 0.0 0.0 - 0.5 K/uL   Basophils Relative 0 %   Basophils Absolute 0.0 0.0 - 0.1 K/uL   Immature Granulocytes 0 %   Abs Immature Granulocytes 0.03 0.00 - 0.07 K/uL    Comment: Performed at [facility], [address] [place], [place] [address]  Resp Panel by RT-PCR (Flu A&B, Covid) Nasopharyngeal Swab     Status: None   Collection Time: 10/24/20  6:35 AM   Specimen: Nasopharyngeal Swab; Nasopharyngeal(NP) swabs in vial transport medium  Result Value Ref Range   SARS Coronavirus 2 by RT PCR NEGATIVE NEGATIVE    Comment: (NOTE) SARS-CoV-2 target nucleic acids are NOT DETECTED.  The SARS-CoV-2 RNA is generally detectable in upper respiratory specimens during the acute phase of infection. The lowest concentration of SARS-CoV-2 viral copies this assay can detect is 138 copies/mL. A negative result does not preclude SARS-Cov-2 infection and should not  be  used as the sole basis for treatment or other patient management decisions. A negative result may occur with  improper specimen collection/handling, submission of specimen other than nasopharyngeal swab, presence of viral mutation(s) within the areas targeted by this assay, and inadequate number of viral copies(<138 copies/mL). A negative result must be combined with clinical observations, patient history, and epidemiological information. The expected result is Negative.  Fact Sheet for Patients:  BloggerCourse.com  Fact Sheet for Healthcare Providers:  SeriousBroker.it  This test is no t yet approved or cleared by the [place] FDA and  has been authorized for detection and/or diagnosis of SARS-CoV-2 by FDA under an Emergency Use Authorization (EUA). This EUA will remain  in effect (meaning this test can be used) for the duration of the COVID-19 declaration under Section 564(b)(1) of the Act, 21 U.S.C.section 360bbb-3(b)(1), unless the authorization is terminated  or revoked sooner.       Influenza A by PCR NEGATIVE NEGATIVE   Influenza B by PCR NEGATIVE NEGATIVE    Comment: (NOTE) The Xpert Xpress SARS-CoV-2/FLU/RSV plus assay is intended as an aid in the diagnosis of influenza from Nasopharyngeal swab specimens and should not be used as a sole basis for treatment. Nasal washings and aspirates are unacceptable for Xpert Xpress SARS-CoV-2/FLU/RSV testing.  Fact Sheet for Patients: BloggerCourse.com  Fact Sheet for Healthcare Providers: SeriousBroker.it  This test is not yet approved or cleared by the [place] FDA and has been authorized for detection and/or diagnosis of SARS-CoV-2 by FDA under an Emergency Use Authorization (EUA). This EUA will remain in effect (meaning this test can be used) for the duration of the COVID-19 declaration under Section 564(b)(1) of  the Act, 21 U.S.C. section 360bbb-3(b)(1), unless the authorization is terminated or revoked.  Performed at [facility], [address] [place], [place] [address]   Rapid urine drug screen (hospital performed)     Status: None   Collection Time: 10/24/20  9:30 AM  Result Value Ref Range   Opiates NONE DETECTED NONE DETECTED   Cocaine NONE DETECTED NONE DETECTED   Benzodiazepines NONE DETECTED NONE DETECTED   Amphetamines NONE DETECTED NONE DETECTED   Tetrahydrocannabinol NONE DETECTED NONE DETECTED   Barbiturates NONE DETECTED NONE DETECTED    Comment: (NOTE) DRUG SCREEN FOR MEDICAL PURPOSES ONLY.  IF CONFIRMATION IS NEEDED FOR ANY PURPOSE, NOTIFY LAB WITHIN 5 DAYS.  LOWEST DETECTABLE LIMITS FOR URINE DRUG SCREEN Drug Class                     Cutoff (ng/mL) Amphetamine and metabolites    1000 Barbiturate and metabolites    200 Benzodiazepine                 200 Tricyclics and metabolites     300 Opiates and metabolites        300 Cocaine and metabolites        300 THC                            50 Performed at [facility], [address], [place], [place] [address]    No results found.  Pending Labs Unresulted Labs (From admission, onward)          Start     Ordered   10/24/20 0136  CBC with Differential/Platelet  Once,   STAT        10/24/20 0136  Vitals/Pain Today's Vitals   10/24/20 2235 10/25/20 0535 10/25/20 0742 10/25/20 0909  BP: 122/67 120/76  105/62  Pulse: 67 75  75  Resp: 20 20  16   Temp: 97.9 F (36.6 C) 98.2 F (36.8 C)  99 F (37.2 C)  TempSrc: Oral Oral  Oral  SpO2: 99% 98%  97%  Weight:      Height:      PainSc:   0-No pain     Isolation Precautions No active isolations  Medications Medications  nicotine (NICODERM CQ - dosed in mg/24 hours) patch 21 mg (21 mg Transdermal Patch Applied 10/25/20 0911)  ondansetron (ZOFRAN) tablet 4 mg (has no administration in time range)     Mobility walks Low fall risk   Focused Assessments .   R Recommendations: See Admitting Provider Note  Report given to:   Additional Notes: n/a

## 2020-10-26 DIAGNOSIS — F319 Bipolar disorder, unspecified: Secondary | ICD-10-CM | POA: Diagnosis not present

## 2020-10-26 MED ORDER — NICOTINE POLACRILEX 2 MG MT GUM
CHEWING_GUM | OROMUCOSAL | Status: AC
  Administered 2020-10-26: 2 mg
  Filled 2020-10-26: qty 1

## 2020-10-26 MED ORDER — NICOTINE POLACRILEX 2 MG MT GUM
2.0000 mg | CHEWING_GUM | OROMUCOSAL | Status: DC | PRN
  Administered 2020-10-27 – 2020-10-28 (×2): 2 mg via ORAL

## 2020-10-26 NOTE — Progress Notes (Signed)
   10/26/20 0810  Psych Admission Type (Psych Patients Only)  Admission Status Voluntary  Psychosocial Assessment  Patient Complaints None  Eye Contact Fair  Facial Expression Flat  Affect Appropriate to circumstance  Speech Logical/coherent  Interaction Assertive  Motor Activity Other (Comment) (wdl)  Appearance/Hygiene Unremarkable  Behavior Characteristics Appropriate to situation  Mood Pleasant  Thought Process  Coherency WDL  Content WDL  Delusions None reported or observed  Perception WDL  Hallucination None reported or observed  Judgment Impaired  Confusion None  Danger to Self  Current suicidal ideation? Denies  Danger to Others  Danger to Others None reported or observed  Sharon Ward has been up and visible on the unit.  She has been attending groups and interacting well with staff and peers.  She denies SI/HI or A/V hallucinations.  She reported wanting to be discharged soon.  She was upset because she signed a 72 hour request for discharge in the ED and believes that she should be discharged since she came in on the 18th.  Explained to her that ED and inpatient unit are two different sights and she will need to sign another 72 hour request for discharge here.  She declined to sign form at this time.  Encouraged her to speak with the doctor about discharge plans.  She completed her self inventory and reported her depression, hopelessness and anxiety are all 0/10 (10 the worst).  Her goal for today is "look for positive coping skills to help me cope and make me feel good about myself."  She will accomplish this goal by "talk to people, draw and talk with my family."  Encouraged continued participation in group and unit activities.  Q 15 minute checks maintained for safety.

## 2020-10-26 NOTE — BHH Group Notes (Signed)
Adult Psychoeducational Group Not Date:  10/26/2020 Time:  0254-2706 Group Topic/Focus: PROGRESSIVE RELAXATION. A group where deep breathing is taught and tensing and relaxation muscle groups is used. Imagery is used as well.  Pts are asked to imagine 3 pillars that hold them up when they are not able to hold themselves up.  Participation Level:  Active  Participation Quality:  Appropriate  Affect:  Appropriate  Cognitive:  Oriented  Insight: Improving  Engagement in Group:  Engaged  Modes of Intervention:  Activity, Discussion, Education, and Support  Additional Comments:  Pt rates her energy as a 10/10. States what holds her up is being around positive people, God and her gratitude for being alive Dione Housekeeper

## 2020-10-26 NOTE — Plan of Care (Signed)
°  Problem: Education: °Goal: Emotional status will improve °Outcome: Progressing °Goal: Mental status will improve °Outcome: Progressing °  °Problem: Activity: °Goal: Interest or engagement in activities will improve °Outcome: Progressing °  °

## 2020-10-26 NOTE — Progress Notes (Signed)
   10/26/20 2038  COVID-19 Daily Checkoff  Have you had a fever (temp > 37.80C/100F)  in the past 24 hours?  No  If you have had runny nose, nasal congestion, sneezing in the past 24 hours, has it worsened? No  COVID-19 EXPOSURE  Have you traveled outside the state in the past 14 days? No  Have you been in contact with someone with a confirmed diagnosis of COVID-19 or PUI in the past 14 days without wearing appropriate PPE? No  Have you been living in the same home as a person with confirmed diagnosis of COVID-19 or a PUI (household contact)? No  Have you been diagnosed with COVID-19? No
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
## 2020-10-26 NOTE — Progress Notes (Signed)
   10/26/20 2041  Psych Admission Type (Psych Patients Only)  Admission Status Voluntary  Psychosocial Assessment  Patient Complaints None  Eye Contact Fair  Facial Expression Animated  Affect Appropriate to circumstance  Speech Logical/coherent  Interaction Assertive  Motor Activity Fidgety  Appearance/Hygiene Unremarkable  Behavior Characteristics Appropriate to situation  Mood Anxious;Pleasant  Thought Process  Coherency WDL  Content WDL  Delusions WDL;None reported or observed  Perception WDL  Hallucination None reported or observed  Judgment Impaired  Confusion None  Danger to Self  Current suicidal ideation? Denies  Danger to Others  Danger to Others None reported or observed
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
## 2020-10-26 NOTE — [facility]HH Suicide Risk Assessment (Signed)
[facility] Admission Suicide Risk Assessment   Nursing information obtained from:  Patient Demographic factors:  Caucasian,Unemployed,Adolescent or young adult,Low socioeconomic status Current Mental Status:  Suicide attempt prior to admission Loss Factors: financial stress Historical Factors:  Impulsivity, h/o past trauma; previous suicide attempt and past psychiatric admissions Risk Reduction Factors:  Sense of responsibility to family,Responsible for children under 70 years of age,Living with another person, especially a relative  Total Time Spent in Direct Patient Care:  I personally spent 50 minutes on the unit in direct patient care. The direct patient care time included face-to-face time with the patient, reviewing the patient's chart, communicating with other professionals, and coordinating care. Greater than 50% of this time was spent in counseling or coordinating care with the patient regarding goals of hospitalization, psycho-education, and discharge planning needs.  Principal Problem: Bipolar 1 disorder (HCC) Diagnosis:  Principal Problem:   Bipolar 1 disorder (HCC)  Subjective Data: The patient is a 25 y/o female with self-reported past psychiatric history significant for borderline personality d/o, depression, and anxiety, who initially was taken by EMS to [facility] ED on 10/24/20 for an overdose. According to her records, she took an unknown amount of Zoloft 50mg  tablets and Seroquel 100mg  tablets after getting into an argument with her fiance. Per the patient, the ingestion was impulsive and was done out of anger and in an effort to get her fiance's attention. The patient states that she has had psychosocial stress related to conflict with her fiance's family, stress raising a 22mo baby, unemployment, and ongoing arguments with her fiance. She states that on the night of her ingestion she had argued with her finance and this triggered thoughts of his previous infidelities in their  relationship which led to her overdose. She knows she has previously been diagnosed with anxiety, depression, and borderline personality d/o with first psychiatric admission as young as age 89. She states that she was in an out of group homes as a child and had decompensation with mood issues when her biologic grandmother died when she was age 23. Her father had schizophrenia. She recalls having issues with anger, cutting, and a prior suicide attempt between ages [age]. She recalls repeated hospitalizations as an adolescent at [facility] in [facility], [facility] in [place], a facility in [place], [place], [place], [place], and [place]. She has not been inpatient for the last 6 years. She cannot recall all of her previous psychotropic medications but thinks she was on Clozaril remotely in the past and that this medication was changed to a combination of Zoloft and Seroquel when she became pregnant. She states she has recently changed her outpatient psychiatric provider and does not know the name of her new psychiatrist. Her records indicate that she was previously working with Dr. Art therapist and last had a f/u with him on 09/03/20 at which time she was on Zoloft 75mg  daily and Seroquel 50mg  qhs for Bipolar I MRE mixed, borderline personality d/o and GAD. Her old records also reflect that she previously had peer support and CST services, previously had a legal guardian, had [employer] housing, and is on SSDI. When questioned about the bipolar diagnosis, she states that to her knowledge she has never had true episodes of mania or hypomania and is unsure about her diagnosis. She recalls doing DBT in the past and hopes to start psychotherapy with a new provider soon. She denies current SI, intent or plan or HI. She denies h/o AVH, paranoia, delusions, ideas of reference or first rank  symptoms. She denies current drug or alcohol use. In the weeks leading up to her admission she states she was not sleeping and eating well, had poor focus  and had low energy in the context of psychosocial stressors. Since benign in the hospital, she states her mood is good, she "feels relaxed" and does not wish to change her present medication regimen. See H&P for additional details.  Continued Clinical Symptoms:  Alcohol Use Disorder Identification Test Final Score (AUDIT): 0 The "Alcohol Use Disorders Identification Test", Guidelines for Use in Primary Care, Second Edition.  World Science writer [facility]). Score between 0-7:  no or low risk or alcohol related problems. Score between 8-15:  moderate risk of alcohol related problems. Score between 16-19:  high risk of alcohol related problems. Score 20 or above:  warrants further diagnostic evaluation for alcohol dependence and treatment.  CLINICAL FACTORS:   More than one psychiatric diagnosis Previous Psychiatric Diagnoses and Treatments  Musculoskeletal: Strength & Muscle Tone: within normal limits Gait & Station: normal, steady Patient leans: N/A  Psychiatric Specialty Exam: Physical Exam Vitals and nursing note reviewed.  HENT:     Head: Normocephalic.  Pulmonary:     Effort: Pulmonary effort is normal.  Neurological:     Mental Status: She is alert.     Review of Systems - see H&P  Blood pressure 125/65, pulse 71, temperature 98.3 F (36.8 C), temperature source Oral, resp. rate 16, height 5\' 3"  (1.6 m), weight 82.6 kg, SpO2 98 %, not currently breastfeeding.Body mass index is 32.24 kg/m.  General Appearance: casually dressed, hair dyed bright red and partly shaved head with hair pulled back, appears stated age  Eye Contact:  Good  Speech:  Clear and Coherent and Normal Rate  Volume:  Normal  Mood:  Anxious  Affect:  Congruent  Thought Process:  Goal directed but circumstantial at times  Orientation:  Full (Time, Place, and Person)  Thought Content:  Logical and Denies AVH, delusions, paranoia; no acute psychosis noted on exam  Suicidal Thoughts:  Denied current SI;  suicide attempt leading to admission  Homicidal Thoughts:  Denied  Memory:  Recent;   Fair  Judgement:  Fair  Insight:  Fair  Psychomotor Activity:  Normal  Concentration:  Concentration: Fair and Attention Span: Fair  Recall:  of Knowledge:  Fair  Language:  Good  Akathisia:  Negative  Assets:  Communication Skills Desire for Improvement Housing Resilience Social Support  ADL's:  Intact  Cognition:  WNL  Sleep:  Number of Hours: 6.75   COGNITIVE FEATURES THAT CONTRIBUTE TO RISK:  None    SUICIDE RISK:   Moderate:  Frequent suicidal ideation with limited intensity, and duration, some specificity in terms of plans, no associated intent, good self-control, limited dysphoria/symptomatology, some risk factors present, and identifiable protective factors, including available and accessible social support.  PLAN OF CARE: Patient is admitted as voluntary patient and will be integrated into the milieu. She does not wish to change her present medication regimen and will therefore be maintained on Seroquel 50mg  qhs and Zoloft 75mg  daily with r/b/se/a to meds discussed with the patient. Admission labs reviewed: UDS negative, Respiratory panel negative, CBC WNL, pregnancy test negative, Salicylate <7, Tylenol <10, BMP WNL except for glucose 100 and BUN 5, ETOH <10, HbgA1c 4.9 on 09/15/20; EKG NSR 73bpm, QTc ; will check lipid panel and TSH; Will have social work clarify her outpatient mental health resources and assist with discharge planning.  I certify that inpatient services furnished can reasonably be expected to improve the patient's condition.   Comer Locket, MD, FAPA 10/26/2020, 2:09 PM

## 2020-10-27 DIAGNOSIS — F319 Bipolar disorder, unspecified: Principal | ICD-10-CM

## 2020-10-27 LAB — T4, FREE: Free T4: 0.82 ng/dL (ref 0.61–1.12)

## 2020-10-27 LAB — LIPID PANEL
Cholesterol: 227 mg/dL — ABNORMAL HIGH (ref 0–200)
HDL: 84 mg/dL (ref >40)
LDL Cholesterol: 126 mg/dL — ABNORMAL HIGH (ref 0–99)
Total CHOL/HDL Ratio: 2.7 RATIO
Triglycerides: 85 mg/dL (ref <150)
VLDL: 17 mg/dL (ref 0–40)

## 2020-10-27 LAB — TSH: TSH: 5.929 u[IU]/mL — ABNORMAL HIGH (ref 0.350–4.500)

## 2020-10-27 NOTE — Progress Notes (Signed)
Pt said that she has been working on positive thinking about herself and other ways for her to handle her depression and anxiety. Pt said that she is excited about discharge and would like to discharge tomorrow. She plans on returning home to her fiance and attending couples therapy with him. She said that she was able to talk to him on the phone tonight and was tearful because she misses them. She said that it is hard being away from them and then she has a 57 month old at home as well. She denies any side effects from her medications. Pt has been active in the milieu and social with her peers. Pt is focused on discharge and said that she will need help with transportation. Will pass this information to oncoming nurse in morning. Pt denies SI/HI and AVH. Active listening, reassurance, and support provided. Medications administered as ordered by provider. Q 15 min safety checks continue. Pt's safety has been maintained.   10/27/20 2059  Psych Admission Type (Psych Patients Only)  Admission Status Voluntary  Psychosocial Assessment  Patient Complaints Anxiety;Depression;Worrying  Eye Contact Fair  Facial Expression Animated;Anxious  Affect Anxious;Appropriate to circumstance  Speech Logical/coherent  Interaction Assertive;Needy  Motor Activity Fidgety  Appearance/Hygiene Unremarkable  Behavior Characteristics Cooperative;Anxious;Appropriate to situation  Mood Anxious;Pleasant  Thought Process  Coherency WDL  Content WDL  Delusions None reported or observed  Perception WDL  Hallucination None reported or observed  Judgment Poor  Confusion None  Danger to Self  Current suicidal ideation? Denies  Danger to Others  Danger to Others None reported or observed

## 2020-10-27 NOTE — Progress Notes (Signed)
[facility] MD Progress Note  10/27/2020 12:23 PM Sharon Ward  MRN:  [id number]   Subjective:  "I feel good today and am glad I came here"  Objective: Sharon Ward a 25 y.o.femalewho took an unknown number of Zoloft 50 mg tablets and Seroquel 100 mg tablets. She tells me she took her usual bedtime dose but thinks she got her pills confused. She then admitted she subsequently took an additional dose (for a total of 4 pills) after she got mad at her fianc. She is evasive when asked if she was attempting to harm her self.   Evaluation on the unit: Patient is seen, chart reviewed and case discussed with the treatment team. Patient stated she is sleeping and eating well. Patient is calm, cooperative, pleasant on approach. Patient stated she is feeling much better and looking forward to going home tomorrow. Patient stated she is taking her medications and has no issues with them. Patient attended group this morning and was given the number for the miscarriage support group. She stated she had a miscarriage in November. She also stated she has a 18 month old daughter at home and is anxious to get home to her and her fiance'. Patient denies SI/HI/AVH, paranoia and delusions. Patient does not appear to be responding to internal stimuli. She is able to contract for safety. She is visible on the unit and is interacting appropriately with peers and staff. Will continue to monitor and offer support and encouragement.   Principal Problem: Bipolar 1 disorder (HCC) Diagnosis: Principal Problem:   Bipolar 1 disorder (HCC)  Total Time spent with patient: 25 minutes  Past Psychiatric History: See H&P  Past Medical History:  Past Medical History:  Diagnosis Date  . Asthma   . Bipolar 1 disorder, mixed (HCC)   . Depression   . Generalized anxiety disorder   . Relationship dysfunction     Past Surgical History:  Procedure Laterality Date  . PILONIDAL CYST / SINUS EXCISION  09/11/2013  . PILONIDAL CYST  EXCISION  05/17/2014   Pilonidal cystectomy with cleft lip   Family History:  Family History  Problem Relation Age of Onset  . Healthy Mother   . Healthy Father    Family Psychiatric  History: See H&P Social History:  Social History   Substance and Sexual Activity  Alcohol Use Not Currently   Comment: occasional prior to pregnancy     Social History   Substance and Sexual Activity  Drug Use Not Currently  . Types: Marijuana   Comment: reports use 4 or 5 times; last used 05/04/19    Social History   Socioeconomic History  . Marital status: Significant Other    Spouse name: Not on file  . Number of children: Not on file  . Years of education: Not on file  . Highest education level: Not on file  Occupational History  . Not on file  Tobacco Use  . Smoking status: Current Every Day Smoker    Packs/day: 0.25    Types: Cigarettes  . Smokeless tobacco: Never Used  . Tobacco comment: only smoke a "couple" of cigarettes when stressed or anxious, socially with friends per [facility] chart  Vaping Use  . Vaping Use: Never used  Substance and Sexual Activity  . Alcohol use: Not Currently    Comment: occasional prior to pregnancy  . Drug use: Not Currently    Types: Marijuana    Comment: reports use 4 or 5 times; last used 05/04/19  . Sexual activity:  Yes    Partners: Male  Other Topics Concern  . Not on file  Social History Narrative  . Not on file   Social Determinants of Health   Financial Resource Strain: Not on file  Food Insecurity: Food Insecurity Present  . Worried About Programme researcher, broadcasting/film/video in the Last Year: Sometimes true  . Ran Out of Food in the Last Year: Sometimes true  Transportation Needs: No Transportation Needs  . Lack of Transportation (Medical): No  . Lack of Transportation (Non-Medical): No  Physical Activity: Not on file  Stress: Not on file  Social Connections: Not on file   Additional Social History:    Sleep: Good  Appetite:  Good  Current  Medications: Current Facility-Administered Medications  Medication Dose Route Frequency Provider Last Rate Last Admin  . acetaminophen (TYLENOL) tablet 650 mg  650 mg Oral Q6H PRN Jaclyn Shaggy, PA-C      . alum & mag hydroxide-simeth (MAALOX/MYLANTA) 200-200-20 MG/5ML suspension 30 mL  30 mL Oral Q4H PRN Jaclyn Shaggy, PA-C      . hydrOXYzine (ATARAX/VISTARIL) tablet 25 mg  25 mg Oral TID PRN Jaclyn Shaggy, PA-C      . magnesium hydroxide (MILK OF MAGNESIA) suspension 30 mL  30 mL Oral Daily PRN Melbourne Abts W, PA-C      . nicotine polacrilex (NICORETTE) gum 2 mg  2 mg Oral PRN Comer Locket, MD   2 mg at 10/27/20 0759  . ondansetron (ZOFRAN) tablet 4 mg  4 mg Oral Q8H PRN Charm Rings, NP      . QUEtiapine (SEROQUEL) tablet 50 mg  50 mg Oral QHS Charm Rings, NP   50 mg at 10/26/20 2104  . sertraline (ZOLOFT) tablet 75 mg  75 mg Oral QHS Jaclyn Shaggy, PA-C   75 mg at 10/26/20 2103    Lab Results:  Results for orders placed or performed during the hospital encounter of 10/25/20 (from the past 48 hour(s))  TSH     Status: Abnormal   Collection Time: 10/27/20  6:29 AM  Result Value Ref Range   TSH 5.929 (H) 0.350 - 4.500 uIU/mL    Comment: Performed by a 3rd Generation assay with a functional sensitivity of <=0.01 uIU/mL. Performed at [facility], [address], [place], [place] [address]   Lipid panel     Status: Abnormal   Collection Time: 10/27/20  6:29 AM  Result Value Ref Range   Cholesterol 227 (H) 0 - 200 mg/dL   Triglycerides 85 <604 mg/dL   HDL 84 >54 mg/dL   Total CHOL/HDL Ratio 2.7 RATIO   VLDL 17 0 - 40 mg/dL   LDL Cholesterol 098 (H) 0 - 99 mg/dL    Comment:        Total Cholesterol/HDL:CHD Risk Coronary Heart Disease Risk Table                     Men   Women  1/2 Average Risk   3.4   3.3  Average Risk       5.0   4.4  2 X Average Risk   9.6   7.1  3 X Average Risk  23.4   11.0        Use the calculated Patient Ratio above and  the CHD Risk Table to determine the patient's CHD Risk.        ATP III CLASSIFICATION (LDL):  <100  mg/dL   Optimal  [id number]  mg/dL   Near or Above                    Optimal  130-159  mg/dL   Borderline  [id number]  mg/dL   High  >811>190     mg/dL   Very High Performed at [facility], [address] [place], [place] [address]     Blood Alcohol level:  Lab Results  Component Value Date   ETH <10 10/24/2020   ETH <10 02/11/2020    Metabolic Disorder Labs: Lab Results  Component Value Date   HGBA1C 4.9 09/15/2020   No results found for: PROLACTIN Lab Results  Component Value Date   CHOL 227 (H) 10/27/2020   TRIG 85 10/27/2020   HDL 84 10/27/2020   CHOLHDL 2.7 10/27/2020   VLDL 17 10/27/2020   LDLCALC 126 (H) 10/27/2020    Physical Findings: AIMS: Facial and Oral Movements Muscles of Facial Expression: None, normal Lips and Perioral Area: None, normal Jaw: None, normal Tongue: None, normal,Extremity Movements Upper (arms, wrists, hands, fingers): None, normal Lower (legs, knees, ankles, toes): None, normal, Trunk Movements Neck, shoulders, hips: None, normal, Overall Severity Severity of abnormal movements (highest score from questions above): None, normal Incapacitation due to abnormal movements: None, normal Patient's awareness of abnormal movements (rate only patient's report): No Awareness, Dental Status Current problems with teeth and/or dentures?: No Does patient usually wear dentures?: No  CIWA:    COWS:     Musculoskeletal: Strength & Muscle Tone: within normal limits Gait & Station: normal Patient leans: N/A  Psychiatric Specialty Exam:  Presentation  General Appearance: No data recorded Eye Contact:No data recorded Speech:No data recorded Speech Volume:No data recorded Handedness:No data recorded  Mood and Affect  Mood:No data recorded Affect:No data recorded  Thought Process  Thought Processes:No data  recorded Descriptions of Associations:No data recorded Orientation:No data recorded Thought Content:No data recorded History of Schizophrenia/Schizoaffective disorder:No  Duration of Psychotic Symptoms:No data recorded Hallucinations:No data recorded Ideas of Reference:No data recorded Suicidal Thoughts:No data recorded Homicidal Thoughts:No data recorded  Sensorium  Memory:No data recorded Judgment:No data recorded Insight:No data recorded  Executive Functions  Concentration:No data recorded Attention Span:No data recorded Recall:No data recorded Fund of Knowledge:No data recorded Language:No data recorded  Psychomotor Activity  Psychomotor Activity:No data recorded  Assets  Assets:No data recorded  Sleep  Sleep:No data recorded   Physical Exam: Physical Exam Constitutional:      Appearance: Normal appearance.  HENT:     Head: Normocephalic.  Pulmonary:     Effort: Pulmonary effort is normal.  Musculoskeletal:        General: Normal range of motion.     Cervical back: Normal range of motion.  Neurological:     Mental Status: She is alert and oriented to person, place, and time.  Psychiatric:        Attention and Perception: Attention normal.    Review of Systems  Constitutional: Negative for fever.  HENT: Negative for congestion and sore throat.   Respiratory: Positive for cough. Negative for shortness of breath.   Cardiovascular: Negative for chest pain.  Gastrointestinal: Negative.   Genitourinary: Negative.   Musculoskeletal: Negative.   Neurological: Negative.    Blood pressure 125/65, pulse 71, temperature 98.3 F (36.8 C), temperature source Oral, resp. rate 16, height 5\' 3"  (1.6 m), weight 82.6 kg, SpO2 98 %, not currently breastfeeding. Body mass index is 32.24 kg/m.  Treatment Plan Summary: Daily contact with patient to assess and evaluate symptoms and progress in treatment and Medication management   Depression:  -Continue Zoloft 75 mg  PO daily  Mood Stabilization:  -Continue Seroquel 50 mg PO at bedtime  Smoking Cessation:  -Continue Nicorette gum 2 mg PRN   15 minute safety checks Encourage participation in the therapeutic milieu Discharge planning ongoing   Laveda Abbe, NP 10/27/2020, 12:23 PM

## 2020-10-27 NOTE — BHH Suicide Risk Assessment (Signed)
[place] INPATIENT:  Family/Significant Other Suicide Prevention Education  Suicide Prevention Education:  Education Completed; Reinaldo Meeker [phone] [facility]) has been identified by the patient as the family member/significant other with whom the patient will be residing, and identified as the person(s) who will aid the patient in the event of a mental health crisis (suicidal ideations/suicide attempt).  With written consent from the patient, the family member/significant other has been provided the following suicide prevention education, prior to the and/or following the discharge of the patient.  The suicide prevention education provided includes the following:  Suicide risk factors  Suicide prevention and interventions  National Suicide Hotline telephone number  [facility] assessment telephone number  [facility] Emergency Assistance [place] and/or Residential Mobile Crisis Unit telephone number  Request made of family/significant other to:  Remove weapons (e.g., guns, rifles, knives), all items previously/currently identified as safety concern.    Remove drugs/medications (over-the-counter, prescriptions, illicit drugs), all items previously/currently identified as a safety concern.  The family member/significant other verbalizes understanding of the suicide prevention education information provided.  The family member/significant other agrees to remove the items of safety concern listed above.  CSW spoke with Mr. [name] who states that he believes his Fiance has been experienincg depression since having their child.  Mr. [name] states that his Fiance has been depressed for several years even before he met her.  Mr. [name] states that he has talked to his Fiance and states that she sounds fine and wants her to come home soon.  Mr. [name] states that there are no firearms or weapons in the home and that he will be there to help his Fiance take care of  the minor child for at least the next week.  Mr. [name] states that he does not have any concerns about his Fiance hurting herself or the minor child.  CSW completed SPE with Mr. [name].   Sharon Ward 10/27/2020, 3:28 PM

## 2020-10-27 NOTE — BHH Group Notes (Signed)
The focus of this group is to help patients establish daily goals to achieve during treatment and discuss how the patient can incorporate goal setting into their daily lives to aide in recovery.  PT did not attend group 

## 2020-10-27 NOTE — Plan of Care (Signed)
  Problem: Education: Goal: Knowledge of [facility] Education information/materials will improve Outcome: Progressing Goal: Verbalization of understanding the information provided will improve Outcome: Progressing   Problem: Coping: Goal: Ability to verbalize frustrations and anger appropriately will improve Outcome: Progressing   

## 2020-10-27 NOTE — BHH Group Notes (Signed)
Occupational Therapy Group Note Date: 10/27/2020 Group Topic/Focus: Sensory Modulation  Group Description: Group encouraged increased engagement and participation through discussion focused on sensory modulation and self-soothing through use of the 8 senses. Discussion introduced the concept of sensory modulation and integration, focusing on how we can utilize our body and it's senses to self-soothe or cope, when we are experiencing an over or under-whelming sensation or feeling. Group members were introduced to a sensory diet checklist as a helpful tool/resource that can be utilized to identify what activities and strategies we prefer and do not prefer based upon our response to different stimulus. The concept of alerting vs calming activities was also introduced to understand how to counteract how we are feeling (Example: when we are feeling overwhelmed/stressed, engage in something calming. When we are feeling depressed/low energy, engage in something alerting). Group members engaged actively in discussion sharing their own personal sensory likes/dislikes.   Therapeutic Goals: Identify calming vs alerting activities to self soothe Identify activities to cope and self soothe through use of the 8 senses Participation Level: Active   Participation Quality: Independent   Behavior: Calm, Cooperative and Interactive   Speech/Thought Process: Focused   Affect/Mood: Euthymic   Insight: Fair   Judgement: Fair   Individualization: Sharon Ward was active in their participation of group discussion/activity. She identified watching movies, listening to music, and taking her 9 mo daughter for walks as self-soothing strategies she enjoys engaging in.   Modes of Intervention: Activity, Discussion and Education  Patient Response to Interventions:  Attentive, Engaged and Receptive   Plan: Continue to engage patient in OT groups 2 - 3x/week.  10/27/2020  Donne Hazel, MOT, OTR/L

## 2020-10-27 NOTE — Progress Notes (Signed)
Spiritual care group on grief and loss facilitated by chaplain Gabrial Domine MDiv, BCC  Group Goal:  Support / Education around grief and loss Members engage in facilitated group support and psycho-social education.  Group Description:  Following introductions and group rules, group members engaged in facilitated group dialog and support around topic of loss, with particular support around experiences of loss in their lives. Group Identified types of loss (relationships / self / things) and identified patterns, circumstances, and changes that precipitate losses. Reflected on thoughts / feelings around loss, normalized grief responses, and recognized variety in grief experience.   Group noted Worden's four tasks of grief in discussion.  Group drew on Adlerian / Rogerian, narrative, MI, Patient Progress:  

## 2020-10-27 NOTE — Tx Team (Signed)
Interdisciplinary Treatment and Diagnostic Plan Update  10/27/2020 Time of Session: 9:20am Sharon Ward MRN: [id number]  Principal Diagnosis: Bipolar 1 disorder (HCC)  Secondary Diagnoses: Principal Problem:   Bipolar 1 disorder (HCC)   Current Medications:  Current Facility-Administered Medications  Medication Dose Route Frequency Provider Last Rate Last Admin  . acetaminophen (TYLENOL) tablet 650 mg  650 mg Oral Q6H PRN Jaclyn Shaggy, PA-C      . alum & mag hydroxide-simeth (MAALOX/MYLANTA) 200-200-20 MG/5ML suspension 30 mL  30 mL Oral Q4H PRN Jaclyn Shaggy, PA-C      . hydrOXYzine (ATARAX/VISTARIL) tablet 25 mg  25 mg Oral TID PRN Jaclyn Shaggy, PA-C      . magnesium hydroxide (MILK OF MAGNESIA) suspension 30 mL  30 mL Oral Daily PRN Melbourne Abts W, PA-C      . nicotine polacrilex (NICORETTE) gum 2 mg  2 mg Oral PRN Comer Locket, MD   2 mg at 10/27/20 0759  . ondansetron (ZOFRAN) tablet 4 mg  4 mg Oral Q8H PRN Charm Rings, NP      . QUEtiapine (SEROQUEL) tablet 50 mg  50 mg Oral QHS Charm Rings, NP   50 mg at 10/26/20 2104  . sertraline (ZOLOFT) tablet 75 mg  75 mg Oral QHS Jaclyn Shaggy, PA-C   75 mg at 10/26/20 2103   PTA Medications: Medications Prior to Admission  Medication Sig Dispense Refill Last Dose  . Ferrous Sulfate (IRON PO) Take 1 tablet by mouth daily.     . QUEtiapine (SEROQUEL) 50 MG tablet Take 1 tablet (50 mg total) by mouth at bedtime. Dose reduced. Should have meds for now . Dose now is 50mg  qhs 30 tablet 0   . sertraline (ZOLOFT) 50 MG tablet Take 1.5 tablets (75 mg total) by mouth daily. Refill when due. Dose increased today 45 tablet 0   . SPRINTEC 28 0.25-35 MG-MCG tablet Take 1 tablet by mouth daily.       Patient Stressors: Legal issue Marital or family conflict  Patient Strengths: Average or above average intelligence Communication skills Motivation for treatment/growth Physical Health  Treatment Modalities: Medication  Management, Group therapy, Case management,  1 to 1 session with clinician, Psychoeducation, Recreational therapy.   Physician Treatment Plan for Primary Diagnosis: Bipolar 1 disorder (HCC) Long Term Goal(s): Improvement in symptoms so as ready for discharge   Short Term Goals: Ability to identify changes in lifestyle to reduce recurrence of condition will improve Ability to verbalize feelings will improve Ability to disclose and discuss suicidal ideas Ability to demonstrate self-control will improve Ability to identify and develop effective coping behaviors will improve Ability to maintain clinical measurements within normal limits will improve  Medication Management: Evaluate patient's response, side effects, and tolerance of medication regimen.  Therapeutic Interventions: 1 to 1 sessions, Unit Group sessions and Medication administration.  Evaluation of Outcomes: Progressing  Physician Treatment Plan for Secondary Diagnosis: Principal Problem:   Bipolar 1 disorder (HCC)  Long Term Goal(s): Improvement in symptoms so as ready for discharge   Short Term Goals: Ability to identify changes in lifestyle to reduce recurrence of condition will improve Ability to verbalize feelings will improve Ability to disclose and discuss suicidal ideas Ability to demonstrate self-control will improve Ability to identify and develop effective coping behaviors will improve Ability to maintain clinical measurements within normal limits will improve     Medication Management: Evaluate patient's response, side effects, and tolerance of medication regimen.  Therapeutic  Interventions: 1 to 1 sessions, Unit Group sessions and Medication administration.  Evaluation of Outcomes: Progressing   RN Treatment Plan for Primary Diagnosis: Bipolar 1 disorder (HCC) Long Term Goal(s): Knowledge of disease and therapeutic regimen to maintain health will improve  Short Term Goals: Ability to remain free from  injury will improve, Ability to verbalize frustration and anger appropriately will improve, Ability to identify and develop effective coping behaviors will improve and Compliance with prescribed medications will improve  Medication Management: RN will administer medications as ordered by provider, will assess and evaluate patient's response and provide education to patient for prescribed medication. RN will report any adverse and/or side effects to prescribing provider.  Therapeutic Interventions: 1 on 1 counseling sessions, Psychoeducation, Medication administration, Evaluate responses to treatment, Monitor vital signs and CBGs as ordered, Perform/monitor CIWA, COWS, AIMS and Fall Risk screenings as ordered, Perform wound care treatments as ordered.  Evaluation of Outcomes: Progressing   LCSW Treatment Plan for Primary Diagnosis: Bipolar 1 disorder (HCC) Long Term Goal(s): Safe transition to appropriate next level of care at discharge, Engage patient in therapeutic group addressing interpersonal concerns.  Short Term Goals: Engage patient in aftercare planning with referrals and resources, Increase social support, Identify triggers associated with mental health/substance abuse issues and Increase skills for wellness and recovery  Therapeutic Interventions: Assess for all discharge needs, 1 to 1 time with Social worker, Explore available resources and support systems, Assess for adequacy in community support network, Educate family and significant other(s) on suicide prevention, Complete Psychosocial Assessment, Interpersonal group therapy.  Evaluation of Outcomes: Progressing   Progress in Treatment: Attending groups: Yes. Participating in groups: Yes. Taking medication as prescribed: Yes. Toleration medication: Yes. Family/Significant other contact made: Yes, individual(s) contacted:  fiance Patient understands diagnosis: Yes. Discussing patient identified problems/goals with staff:  Yes. Medical problems stabilized or resolved: Yes. Denies suicidal/homicidal ideation: Yes. Issues/concerns per patient self-inventory: No.  New problem(s) identified: No, Describe:  none  New Short Term/Long Term Goal(s): medication stabilization, elimination of SI thoughts, development of comprehensive mental wellness plan.   Patient Goals:  "Needed to think and process. Being able to talk to people"   Discharge Plan or Barriers: Patient is to return home and is to follow up at [facility] for therapy and medication management   Reason for Continuation of Hospitalization: Depression Medication stabilization  Estimated Length of Stay: 3-5 days  Attendees: Patient: Sharon Ward 10/27/2020 1:43 PM  Physician: Bartholomew Crews, MD 10/27/2020 1:43 PM  Nursing:  10/27/2020 1:43 PM  RN Care Manager: 10/27/2020 1:43 PM  Social Worker: Ruthann Cancer, LCSW 10/27/2020 1:43 PM  Recreational Therapist:  10/27/2020 1:43 PM  Other:  10/27/2020 1:43 PM  Other:  10/27/2020 1:43 PM  Other: 10/27/2020 1:43 PM    Scribe for Treatment Team: Otelia Santee, LCSW 10/27/2020 1:43 PM

## 2020-10-27 NOTE — Progress Notes (Signed)
Recreation Therapy Notes  Date: 3.21.22 Time: 0930 Location: [address] Dayroom  Group Topic: Stress Management   Goal Area(s) Addresses:  Patient will actively participate in stress management techniques presented during session.   Behavioral Response: Engaged  Intervention: Stress management techniques  Activity :Guided Imagery.  LRT read a script that focused on taking a journey through a wildlife sanctuary.  Patients were to listen and follow along as meditation played to engage in activity.  Education:  Stress Management, Discharge Planning.   Education Outcome: Acknowledges education  Clinical Observations/Feedback: Patient actively engaged in technique introduced and expressed no concerns.   Caroll Rancher, LRT/CTRS         Caroll Rancher A 10/27/2020 11:23 AM

## 2020-10-27 NOTE — [facility] Group Notes (Signed)
Type of Therapy and Topic:  Group Therapy:  Positive Affirmations   Participation Level:  Active  Description of Group: This group addressed positive affirmation toward self and others. Patients went around the room and identified two positive things about themselves and two positive things about a peer in the room. Patients reflected on how it felt to share something positive with others, to identify positive things about themselves, and to hear positive things from others. Patients were encouraged to have a daily reflection of positive characteristics or circumstances. Therapeutic Goals 1. Patient will verbalize two of their positive qualities 2. Patient will demonstrate empathy for others by stating two positive qualities about a peer in the group 3. Patient will verbalize their feelings when voicing positive self affirmations and when voicing positive affirmations of others 4. Patients will discuss the potential positive impact on their wellness/recovery of focusing on positive traits of self and others. Summary of Patient Progress:  [name] attended group and shared that she is happy that she is feeling more like her self again and that she is going home tomorrow.  [name] states that [name] is something that she does often and would like to work on. [name] accepted the worksheets that were provided during group.   Therapeutic Modalities Cognitive Behavioral Therapy Motivational Interviewing

## 2020-10-27 NOTE — Progress Notes (Signed)
Progress note    10/27/20 0800  Psych Admission Type (Psych Patients Only)  Admission Status Voluntary  Psychosocial Assessment  Patient Complaints Anxiety  Eye Contact Fair  Facial Expression Animated;Anxious  Affect Anxious;Appropriate to circumstance  Speech Logical/coherent  Interaction Assertive  Motor Activity Fidgety;Restless  Appearance/Hygiene Unremarkable  Behavior Characteristics Cooperative;Appropriate to situation;Anxious  Mood Anxious;Pleasant  Thought Process  Coherency WDL  Delusions WDL  Perception WDL  Hallucination None reported or observed  Judgment Poor  Confusion None  Danger to Self  Current suicidal ideation? Denies  Danger to Others  Danger to Others None reported or observed

## 2020-10-28 LAB — T3 UPTAKE: T3 Uptake Ratio: 19 % — ABNORMAL LOW (ref 24–39)

## 2020-10-28 MED ORDER — QUETIAPINE FUMARATE 50 MG PO TABS: 50.0000 mg | ORAL_TABLET | Freq: Every day | ORAL | 0 refills | Status: DC

## 2020-10-28 MED ORDER — SERTRALINE HCL 25 MG PO TABS: 75.0000 mg | ORAL_TABLET | Freq: Every day | ORAL | 0 refills | Status: DC

## 2020-10-28 MED ORDER — HYDROXYZINE HCL 25 MG PO TABS
25.0000 mg | ORAL_TABLET | Freq: Three times a day (TID) | ORAL | 0 refills | Status: DC | PRN
Start: 2020-10-28 — End: 2020-11-07

## 2020-10-28 NOTE — [facility] Suicide Risk Assessment (Signed)
[facility] Discharge Suicide Risk Assessment   Principal Problem: Bipolar 1 disorder [facility]) Discharge Diagnoses: Principal Problem:   Bipolar 1 disorder (HCC)   Total Time spent with patient: 15 minutes  Musculoskeletal: Strength & Muscle Tone: within normal limits Gait & Station: normal Patient leans: N/A  Psychiatric Specialty Exam: Review of Systems  All other systems reviewed and are negative.   Blood pressure 125/65, pulse 71, temperature 98.3 F (36.8 C), temperature source Oral, resp. rate 16, height 5\' 3"  (1.6 m), weight 82.6 kg, SpO2 98 %, not currently breastfeeding.Body mass index is 32.24 kg/m.  General Appearance: Casual  Eye Contact::  Good  Speech:  Normal Rate409  Volume:  Normal  Mood:  Euthymic  Affect:  Congruent  Thought Process:  Coherent and Descriptions of Associations: Intact  Orientation:  Full (Time, Place, and Person)  Thought Content:  Logical  Suicidal Thoughts:  No  Homicidal Thoughts:  No  Memory:  Immediate;   Good Recent;   Good Remote;   Good  Judgement:  Intact  Insight:  Fair  Psychomotor Activity:  Increased  Concentration:  Good  Recall:  Good  Fund of Knowledge:Good  Language: Good  Akathisia:  Negative  Handed:  Right  AIMS (if indicated):     Assets:  Desire for Improvement Housing Resilience Social Support  Sleep:  Number of Hours: 6.75  Cognition: WNL  ADL's:  Intact   Mental Status Per Nursing Assessment::   On Admission:  Suicidal ideation indicated by others  Demographic Factors:  Caucasian, Low socioeconomic status and Unemployed  Loss Factors: Financial problems/change in socioeconomic status  Historical Factors: Impulsivity  Risk Reduction Factors:   Living with another person, especially a relative  Continued Clinical Symptoms:  Bipolar Disorder:   Depressive phase  Cognitive Features That Contribute To Risk:  None    Suicide Risk:  Minimal: No identifiable suicidal ideation.  Patients presenting  with no risk factors but with morbid ruminations; may be classified as minimal risk based on the severity of the depressive symptoms   Follow-up Information    [facility], [facility]. Call.   Specialty: Behavioral Health Why: Please call to confirm your appointments scheduled for 11/07/20 and 11/09/20, for therapy and medication management services as we have been unable to contact.  Contact information: [address] [place] [phone]               Plan Of Care/Follow-up recommendations:  Activity:  ad lib  [phone], MD 10/28/2020, 9:41 AM

## 2020-10-28 NOTE — Plan of Care (Signed)
Discharge note  Patient verbalizes readiness for discharge. Follow up plan explained, AVS, Transition record and SRA given. Prescriptions and teaching provided. Belongings returned and signed for. Suicide safety plan completed and signed. Patient verbalizes understanding. Patient denies SI/HI and assures this Clinical research associate they will seek assistance should that change. Patient discharged to lobby to wait for safe transport.  Problem: Education: Goal: Knowledge of [facility] Education information/materials will improve Outcome: Adequate for Discharge Goal: Emotional status will improve Outcome: Adequate for Discharge Goal: Mental status will improve Outcome: Adequate for Discharge Goal: Verbalization of understanding the information provided will improve Outcome: Adequate for Discharge   Problem: Activity: Goal: Interest or engagement in activities will improve Outcome: Adequate for Discharge Goal: Sleeping patterns will improve Outcome: Adequate for Discharge   Problem: Coping: Goal: Ability to verbalize frustrations and anger appropriately will improve Outcome: Adequate for Discharge Goal: Ability to demonstrate self-control will improve Outcome: Adequate for Discharge   Problem: Health Behavior/Discharge Planning: Goal: Identification of resources available to assist in meeting health care needs will improve 10/28/2020 1050 by Raylene Miyamoto, RN Outcome: Adequate for Discharge 10/28/2020 0263 by Raylene Miyamoto, RN Outcome: Progressing Goal: Compliance with treatment plan for underlying cause of condition will improve 10/28/2020 1050 by Raylene Miyamoto, RN Outcome: Adequate for Discharge 10/28/2020 [id number] by Raylene Miyamoto, RN Outcome: Progressing   Problem: Physical Regulation: Goal: Ability to maintain clinical measurements within normal limits will improve 10/28/2020 1050 by Raylene Miyamoto, RN Outcome: Adequate for Discharge 10/28/2020 8502 by Raylene Miyamoto, RN Outcome: Progressing   Problem: Safety: Goal: Periods of time without injury will increase Outcome: Adequate for Discharge   Problem: Education: Goal: Ability to make informed decisions regarding treatment will improve Outcome: Adequate for Discharge   Problem: Coping: Goal: Coping ability will improve Outcome: Adequate for Discharge   Problem: Health Behavior/Discharge Planning: Goal: Identification of resources available to assist in meeting health care needs will improve Outcome: Adequate for Discharge   Problem: Medication: Goal: Compliance with prescribed medication regimen will improve Outcome: Adequate for Discharge   Problem: Self-Concept: Goal: Ability to disclose and discuss suicidal ideas will improve Outcome: Adequate for Discharge Goal: Will verbalize positive feelings about self Outcome: Adequate for Discharge   Problem: Education: Goal: Ability to state activities that reduce stress will improve Outcome: Adequate for Discharge   Problem: Coping: Goal: Ability to identify and develop effective coping behavior will improve Outcome: Adequate for Discharge   Problem: Self-Concept: Goal: Ability to identify factors that promote anxiety will improve Outcome: Adequate for Discharge Goal: Level of anxiety will decrease Outcome: Adequate for Discharge Goal: Ability to modify response to factors that promote anxiety will improve Outcome: Adequate for Discharge   Problem: Education: Goal: Utilization of techniques to improve thought processes will improve Outcome: Adequate for Discharge Goal: Knowledge of the prescribed therapeutic regimen will improve Outcome: Adequate for Discharge   Problem: Activity: Goal: Interest or engagement in leisure activities will improve Outcome: Adequate for Discharge Goal: Imbalance in normal sleep/wake cycle will improve Outcome: Adequate for Discharge   Problem: Coping: Goal: Coping ability will  improve Outcome: Adequate for Discharge Goal: Will verbalize feelings Outcome: Adequate for Discharge   Problem: Health Behavior/Discharge Planning: Goal: Ability to make decisions will improve Outcome: Adequate for Discharge Goal: Compliance with therapeutic regimen will improve Outcome: Adequate for Discharge   Problem: Role Relationship: Goal: Will demonstrate positive changes in social behaviors and relationships Outcome: Adequate for Discharge   Problem: Safety:  Goal: Ability to disclose and discuss suicidal ideas will improve Outcome: Adequate for Discharge Goal: Ability to identify and utilize support systems that promote safety will improve Outcome: Adequate for Discharge   Problem: Self-Concept: Goal: Will verbalize positive feelings about self Outcome: Adequate for Discharge Goal: Level of anxiety will decrease Outcome: Adequate for Discharge

## 2020-10-28 NOTE — Plan of Care (Signed)
  Problem: Health Behavior/Discharge Planning: Goal: Identification of resources available to assist in meeting health care needs will improve Outcome: Progressing Goal: Compliance with treatment plan for underlying cause of condition will improve Outcome: Progressing   Problem: Physical Regulation: Goal: Ability to maintain clinical measurements within normal limits will improve Outcome: Progressing   
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
## 2020-10-28 NOTE — Progress Notes (Signed)
  [facility] Adult Case Management Discharge Plan :  Will you be returning to the same living situation after discharge:  Yes,  Home At discharge, do you have transportation home?: Yes,  Safe Transport  Do you have the ability to pay for your medications: Yes,  Medicaid   Release of information consent forms completed and in the chart;  Patient's signature needed at discharge.  Patient to Follow up at:  Follow-up Information    [facility]. Call.   Specialty: Behavioral Health Why: Please call to confirm your appointments scheduled for 11/07/20 and 11/09/20, for therapy and medication management services as we have been unable to contact.  Contact information: [address] [place] [address] [phone]               Next level of care provider has access to [facility] Link:no  Safety Planning and Suicide Prevention discussed: Yes,  with Fiance and patient      Has patient been referred to the Quitline?: Patient refused referral  Patient has been referred for addiction treatment: N/A  Aram Beecham, LCSWA 10/28/2020, 9:38 AM

## 2020-11-02 ENCOUNTER — Encounter (HOSPITAL_COMMUNITY): Payer: Self-pay

## 2020-11-02 ENCOUNTER — Other Ambulatory Visit: Payer: Self-pay

## 2020-11-02 ENCOUNTER — Inpatient Hospital Stay (HOSPITAL_COMMUNITY)
Admission: EM | Admit: 2020-11-02 | Discharge: 2020-11-07 | DRG: 918 | Disposition: A | Discharge status: Other Acute Inpatient Hospital | Payer: Medicaid Other | Attending: Internal Medicine | Admitting: Internal Medicine

## 2020-11-02 DIAGNOSIS — Z91018 Allergy to other foods: Secondary | ICD-10-CM

## 2020-11-02 DIAGNOSIS — T1491XA Suicide attempt, initial encounter: Secondary | ICD-10-CM | POA: Diagnosis not present

## 2020-11-02 DIAGNOSIS — Z9151 Personal history of suicidal behavior: Secondary | ICD-10-CM | POA: Diagnosis present

## 2020-11-02 DIAGNOSIS — F411 Generalized anxiety disorder: Secondary | ICD-10-CM | POA: Diagnosis present

## 2020-11-02 DIAGNOSIS — K5900 Constipation, unspecified: Secondary | ICD-10-CM | POA: Diagnosis present

## 2020-11-02 DIAGNOSIS — Z20822 Contact with and (suspected) exposure to covid-19: Secondary | ICD-10-CM | POA: Diagnosis present

## 2020-11-02 DIAGNOSIS — R9431 Abnormal electrocardiogram [ECG] [EKG]: Secondary | ICD-10-CM | POA: Diagnosis not present

## 2020-11-02 DIAGNOSIS — T43222A Poisoning by selective serotonin reuptake inhibitors, intentional self-harm, initial encounter: Principal | ICD-10-CM | POA: Diagnosis present

## 2020-11-02 DIAGNOSIS — E876 Hypokalemia: Secondary | ICD-10-CM | POA: Diagnosis present

## 2020-11-02 DIAGNOSIS — T50901A Poisoning by unspecified drugs, medicaments and biological substances, accidental (unintentional), initial encounter: Secondary | ICD-10-CM | POA: Diagnosis present

## 2020-11-02 DIAGNOSIS — F3160 Bipolar disorder, current episode mixed, unspecified: Secondary | ICD-10-CM | POA: Diagnosis present

## 2020-11-02 DIAGNOSIS — R1930 Abdominal rigidity, unspecified site: Secondary | ICD-10-CM

## 2020-11-02 DIAGNOSIS — T50902A Poisoning by unspecified drugs, medicaments and biological substances, intentional self-harm, initial encounter: Secondary | ICD-10-CM | POA: Diagnosis present

## 2020-11-02 DIAGNOSIS — Z79899 Other long term (current) drug therapy: Secondary | ICD-10-CM

## 2020-11-02 DIAGNOSIS — F17210 Nicotine dependence, cigarettes, uncomplicated: Secondary | ICD-10-CM | POA: Diagnosis present

## 2020-11-02 DIAGNOSIS — T43592A Poisoning by other antipsychotics and neuroleptics, intentional self-harm, initial encounter: Secondary | ICD-10-CM | POA: Diagnosis present

## 2020-11-02 HISTORY — DX: Abnormal electrocardiogram (ECG) (EKG): R94.31

## 2020-11-02 HISTORY — DX: Suicide attempt, initial encounter: T14.91XA

## 2020-11-02 LAB — COMPREHENSIVE METABOLIC PANEL
ALT: 16 U/L (ref 0–44)
AST: 16 U/L (ref 15–41)
Albumin: 4 g/dL (ref 3.5–5.0)
Alkaline Phosphatase: 52 U/L (ref 38–126)
Anion gap: 9 (ref 5–15)
BUN: 14 mg/dL (ref 6–20)
CO2: 22 mmol/L (ref 22–32)
Calcium: 9 mg/dL (ref 8.9–10.3)
Chloride: 107 mmol/L (ref 98–111)
Creatinine, Ser: 0.75 mg/dL (ref 0.44–1.00)
GFR, Estimated: >60 mL/min (ref >60)
Glucose, Bld: 108 mg/dL — ABNORMAL HIGH (ref 70–99)
Potassium: 3.3 mmol/L — ABNORMAL LOW (ref 3.5–5.1)
Sodium: 138 mmol/L (ref 135–145)
Total Bilirubin: 0.4 mg/dL (ref 0.3–1.2)
Total Protein: 7.1 g/dL (ref 6.5–8.1)

## 2020-11-02 LAB — CBC WITH DIFFERENTIAL/PLATELET
Abs Immature Granulocytes: 0.02 10*3/uL (ref 0.00–0.07)
Basophils Absolute: 0 10*3/uL (ref 0.0–0.1)
Basophils Relative: 0 %
Eosinophils Absolute: 0.1 10*3/uL (ref 0.0–0.5)
Eosinophils Relative: 1 %
HCT: 42.4 % (ref 36.0–46.0)
Hemoglobin: 14.3 g/dL (ref 12.0–15.0)
Immature Granulocytes: 0 %
Lymphocytes Relative: 30 %
Lymphs Abs: 1.5 10*3/uL (ref 0.7–4.0)
MCH: 32.1 pg (ref 26.0–34.0)
MCHC: 33.7 g/dL (ref 30.0–36.0)
MCV: 95.1 fL (ref 80.0–100.0)
Monocytes Absolute: 0.4 10*3/uL (ref 0.1–1.0)
Monocytes Relative: 8 %
Neutro Abs: 3 10*3/uL (ref 1.7–7.7)
Neutrophils Relative %: 61 %
Platelets: 197 10*3/uL (ref 150–400)
RBC: 4.46 MIL/uL (ref 3.87–5.11)
RDW: 12.2 % (ref 11.5–15.5)
WBC: 5 10*3/uL (ref 4.0–10.5)
nRBC: 0 % (ref 0.0–0.2)

## 2020-11-02 LAB — ACETAMINOPHEN LEVEL
Acetaminophen (Tylenol), Serum: <10 ug/mL — ABNORMAL LOW (ref 10–30)
Acetaminophen (Tylenol), Serum: <10 ug/mL — ABNORMAL LOW (ref 10–30)

## 2020-11-02 LAB — BASIC METABOLIC PANEL
Anion gap: 6 (ref 5–15)
BUN: 9 mg/dL (ref 6–20)
CO2: 21 mmol/L — ABNORMAL LOW (ref 22–32)
Calcium: 8.5 mg/dL — ABNORMAL LOW (ref 8.9–10.3)
Chloride: 112 mmol/L — ABNORMAL HIGH (ref 98–111)
Creatinine, Ser: 0.68 mg/dL (ref 0.44–1.00)
GFR, Estimated: >60 mL/min (ref >60)
Glucose, Bld: 90 mg/dL (ref 70–99)
Potassium: 4.1 mmol/L (ref 3.5–5.1)
Sodium: 139 mmol/L (ref 135–145)

## 2020-11-02 LAB — SALICYLATE LEVEL: Salicylate Lvl: <7 mg/dL — ABNORMAL LOW (ref 7.0–30.0)

## 2020-11-02 LAB — ETHANOL: Alcohol, Ethyl (B): <10 mg/dL (ref <10)

## 2020-11-02 LAB — RESP PANEL BY RT-PCR (FLU A&B, COVID) ARPGX2
Influenza A by PCR: NEGATIVE
Influenza B by PCR: NEGATIVE
SARS Coronavirus 2 by RT PCR: NEGATIVE

## 2020-11-02 LAB — MAGNESIUM
Magnesium: 2.1 mg/dL (ref 1.7–2.4)
Magnesium: 2.2 mg/dL (ref 1.7–2.4)

## 2020-11-02 LAB — I-STAT BETA HCG BLOOD, ED (MC, WL, AP ONLY): I-stat hCG, quantitative: <5 m[IU]/mL (ref <5)

## 2020-11-02 MED ORDER — SODIUM CHLORIDE 0.9% FLUSH
3.0000 mL | Freq: Two times a day (BID) | INTRAVENOUS | Status: DC
  Administered 2020-11-02 – 2020-11-07 (×10): 3 mL via INTRAVENOUS

## 2020-11-02 MED ORDER — SODIUM CHLORIDE 0.9 % IV BOLUS (SEPSIS)
1000.0000 mL | Freq: Once | INTRAVENOUS | Status: AC
  Administered 2020-11-02: 1000 mL via INTRAVENOUS

## 2020-11-02 MED ORDER — POTASSIUM CHLORIDE 10 MEQ/100ML IV SOLN
10.0000 meq | Freq: Once | INTRAVENOUS | Status: AC
  Administered 2020-11-02: 10 meq via INTRAVENOUS
  Filled 2020-11-02: qty 100

## 2020-11-02 MED ORDER — POTASSIUM CHLORIDE 10 MEQ/100ML IV SOLN
10.0000 meq | INTRAVENOUS | Status: AC
  Administered 2020-11-02 (×3): 10 meq via INTRAVENOUS
  Filled 2020-11-02 (×5): qty 100

## 2020-11-02 MED ORDER — LORAZEPAM 2 MG/ML IJ SOLN
2.0000 mg | Freq: Four times a day (QID) | INTRAMUSCULAR | Status: DC | PRN
  Filled 2020-11-02: qty 1

## 2020-11-02 MED ORDER — ACETAMINOPHEN 500 MG PO TABS
500.0000 mg | ORAL_TABLET | Freq: Four times a day (QID) | ORAL | Status: DC | PRN
  Administered 2020-11-02: 500 mg via ORAL
  Filled 2020-11-02: qty 1

## 2020-11-02 MED ORDER — LACTATED RINGERS IV SOLN: INTRAVENOUS | Status: AC

## 2020-11-02 MED ORDER — ENOXAPARIN SODIUM 40 MG/0.4ML ~~LOC~~ SOLN
40.0000 mg | SUBCUTANEOUS | Status: AC
  Filled 2020-11-02: qty 0.4

## 2020-11-02 NOTE — ED Provider Notes (Signed)
[facility]-EMERGENCY DEPT Provider Note   CSN: [id number] Arrival date & time: 11/02/20  0451     History Chief Complaint  Patient presents with  . Suicide Attempt  . Ingestion   Level 5 caveat due to altered mental status Keyundra Fant is a 25 y.o. female.  The history is provided by the patient. The history is limited by the condition of the patient.  Ingestion This is a new problem. The problem occurs constantly. The problem has been gradually worsening. Nothing aggravates the symptoms. Nothing relieves the symptoms.  Patient with history of asthma, bipolar, depression presents with overdose via EMS.  Patient reportedly took a large amount of Zoloft 50 mg tablets and an unknown amount of hydroxyzine 25 mg tablets.  This occurred approximately 3-4 hours ago. No other details are known on arrival.     Past Medical History:  Diagnosis Date  . Asthma   . Bipolar 1 disorder, mixed (HCC)   . Depression   . Generalized anxiety disorder   . Relationship dysfunction     Patient Active Problem List   Diagnosis Date Noted  . Major depressive disorder, 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  . Bipolar 1 disorder (HCC) 10/25/2020  . Left foot pain 10/04/2020  . Obesity affecting pregnancy 09/28/2019  . Anxiety 09/18/2019  . Asthma 09/18/2019  . Bipolar disorder (HCC) 09/18/2019  . Depression 09/18/2019  . LGSIL on Pap smear of cervix 09/18/2019  . Oppositional defiant disorder 09/18/2019  . H/O psychiatric hospitalization 09/18/2019  . Seasonal allergies 09/18/2019  . Suicidal ideation 09/18/2019  . 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 PILONIDAL CYST / SINUS EXCISION  09/11/2013  . PILONIDAL CYST EXCISION  05/17/2014   Pilonidal cystectomy with cleft lip     OB History    Gravida  3   Para  1   Term  1   Preterm      AB      Living  1     SAB      IAB       Ectopic      Multiple  0   Live Births  1           Family History  Problem Relation Age of Onset  . Healthy Mother   . Healthy Father     Social History   Tobacco Use  . Smoking status: Current Every Day Smoker    Packs/day: 0.25    Types: Cigarettes  . Smokeless tobacco: Never Used  . Tobacco comment: only smoke a "couple" of cigarettes when stressed or anxious, socially with friends per [facility] chart  Vaping Use  . Vaping Use: Never used  Substance Use Topics  . Alcohol use: Not Currently    Comment: occasional prior to pregnancy  . Drug use: Not Currently    Types: Marijuana    Comment: reports use 4 or 5 times; last used 05/04/19    Home Medications Prior to Admission medications   Medication Sig Start Date End Date Taking? Authorizing Provider  hydrOXYzine (ATARAX/VISTARIL) 25 MG tablet Take 1 tablet (25 mg total) by mouth 3 (three) times daily as needed for anxiety. 10/28/20  Yes Laveda Abbe, NP  sertraline (ZOLOFT) 25 MG tablet Take 3 tablets (75 mg total) by mouth at bedtime. 10/28/20  Yes Laveda Abbe, NP  Ferrous Sulfate (IRON PO) Take 1 tablet by mouth daily.  [provider]  QUEtiapine (SEROQUEL) 50 MG tablet Take 1 tablet (50 mg total) by mouth at bedtime. 10/28/20   Laveda Abbe, NP  SPRINTEC 28 0.25-35 MG-MCG tablet Take 1 tablet by mouth daily. 10/01/20   [provider]    Allergies    Ascorbate, Citrus, Coconut flavor, Lamotrigine, Orange (diagnostic), Peach flavor, Pear, and Pineapple  Review of Systems   Review of Systems  Unable to perform ROS: Mental status change    Physical Exam Updated Vital Signs BP 116/75   Pulse 90   Temp 98 F (36.7 C) (Oral)   Resp 13   Ht 1.6 m (5\' 3" )   Wt 79.4 kg   SpO2 100%   BMI 31.01 kg/m   Physical Exam CONSTITUTIONAL: Disheveled, somnolent HEAD: Normocephalic/atraumatic EYES: EOMI/PERRL, no nystagmus ENMT: Mucous membranes moist NECK: supple no  meningeal signs SPINE/BACK:entire spine nontender CV: S1/S2 noted, no murmurs/rubs/gallops noted LUNGS: Lungs are clear to auscultation bilaterally, no apparent distress ABDOMEN: soft, nontender, no rebound  NEURO: Pt is somnolent but arousable to voice and pain.  She moves all extremities x4 and follows commands EXTREMITIES: pulses normal/equal, full ROM SKIN: warm, color normal PSYCH: Unable to assess  ED Results / Procedures / Treatments   Labs (all labs ordered are listed, but only abnormal results are displayed) Labs Reviewed  COMPREHENSIVE METABOLIC PANEL - Abnormal; Notable for the following components:      Result Value   Potassium 3.3 (*)    Glucose, Bld 108 (*)    All other components within normal limits  ACETAMINOPHEN LEVEL - Abnormal; Notable for the following components:   Acetaminophen (Tylenol), Serum <10 (*)    All other components within normal limits  SALICYLATE LEVEL - Abnormal; Notable for the following components:   Salicylate Lvl <7.0 (*)    All other components within normal limits  CBC WITH DIFFERENTIAL/PLATELET  ETHANOL  MAGNESIUM  RAPID URINE DRUG SCREEN, HOSP PERFORMED  I-STAT BETA HCG BLOOD, ED ([facility], [facility], [facility] ONLY)    EKG EKG Interpretation  Date/Time:  Sunday November 02 2020 05:04:08 EDT Ventricular Rate:  91 PR Interval:    QRS Duration: 92 QT Interval:  394 QTC Calculation: 485 R Axis:   53 Text Interpretation: Sinus rhythm Abnormal Q suggests anterior infarct Borderline T wave abnormalities Borderline prolonged QT interval Confirmed by 09-10-1992 (Zadie Rhine) on 11/02/2020 5:18:31 AM   Radiology No results found.  Procedures .Critical Care Performed by: 11/04/2020, MD Authorized by: Zadie Rhine, MD   Critical care provider statement:    Critical care time (minutes):  41   Critical care start time:  11/02/2020 6:19 AM   Critical care end time:  11/02/2020 7:00 AM   Critical care time was exclusive of:  Separately billable  procedures and treating other patients   Critical care was necessary to treat or prevent imminent or life-threatening deterioration of the following conditions:  Toxidrome and CNS failure or compromise   Critical care was time spent personally by me on the following activities:  Examination of patient, pulse oximetry, ordering and review of laboratory studies, ordering and performing treatments and interventions and re-evaluation of patient's condition   I assumed direction of critical care for this patient from another provider in my specialty: no       Medications Ordered in ED Medications  sodium chloride 0.9 % bolus 1,000 mL (0 mLs Intravenous Stopped 11/02/20 0647)    ED Course  I have reviewed the triage vital signs  and the nursing notes.  Pertinent labs & imaging results that were available during my care of the patient were reviewed by me and considered in my medical decision making (see chart for details).    MDM Rules/Calculators/A&P                          6:20 AM Patient presents after overdose.  Patient has a known history of behavioral health issues and was recently been admitted for overdose.  Tonight reported she took a large quantity of Zoloft as well as hydroxyzine.  Patient was extremely somnolent on my initial evaluation.  On my second evaluation, patient was easily arousable. Nursing notes document poison control recommendations.  Patient should be monitored for up to 6 hours.  Will need repeat EKG and Tylenol level at the fourth hour 7:28 AM Initial labs reassuring.     Will need repeat EKG and Tylenol level around 9 AM.  If those are reassuring, patient is more awake she can be transferred to psychiatry Signed out to Dr. Estell Harpin at shift change  Final Clinical Impression(s) / ED Diagnoses Final diagnoses:  Suicide attempt [facility])  Intentional drug overdose, initial encounter [facility])    Rx / DC Orders ED Discharge Orders    None       Zadie Rhine,  MD 11/02/20 0730

## 2020-11-02 NOTE — ED Triage Notes (Signed)
Pt BIB [facility] from home after ingesting unknown amount of sertraline 50mg  tabs and unknown amount of hydroxyzine 25mg  tabs. Hydroxyzine rx filled 3 days ago with 30 pills and bottle now empty, sertraline bottle unable to locate. Around 2am. Admitted SI attempt.  BP 132/68 HR 118 SpO2 98% RA

## 2020-11-02 NOTE — Consult Note (Signed)
Patient seen in person after admission to the floor from the ED after an overdose on sertraline and hydroxyzine, discharged from [facility] on 3/22. Sleeping soundly with snoring on attempt to evaluate.  The sitter reports, "She been sleeping the whole time (since moving to the floor a couple of hours ago)" and she may awaken to her name but falls back to sleep.  Psych will continue to follow and TOC placed as she will need psychiatric hospitalization when she is medically cleared, please refrain from starting sertraline and hydroxyzine or other SSRI/SSNRIs.  Nanine Means, PMHNP
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
## 2020-11-02 NOTE — ED Provider Notes (Signed)
Patient is still tachycardic and lethargic she has a QTC of almost 500.  She will be admitted for monitoring by the medicine service   Bethann Berkshire, MD 11/04/20 1007

## 2020-11-02 NOTE — ED Notes (Signed)
Spoke to Jefferson, Charity fundraiser at [employer]. Provided update and received recommendations as follows:  Repeat EKG ~0900. If QTc still near 480, optimize K+ to 4+. Mg levels appropriate at 2.1 Repeat acetaminophen level ~0930  Provider aware. Received orders for K+

## 2020-11-02 NOTE — ED Notes (Signed)
Spoke with May at poison control. Provided update and received recommendations as follows:  Serial EKGs q6 Monitor K+ and Mg+ levels q8-12 and replace/optimize if needed based on QT/QTc length  Poison control will follow up with 4th floor staff this evening

## 2020-11-02 NOTE — Progress Notes (Signed)
Poison Control called to f/u on patient. She spent most of the shift sleeping, safety sitter at bedside around the the clock. She is a/o x 4 when awake. Speech is clear. Able to voice needs. Denies pain or discomfort. Vital signs stable.  Currently denies suicidal thoughts/ideation.  EKG repeated, NSR. QT/QTC 408/476. No further instructions from Poison Control. Will call back on day shift to check on patient's progress.

## 2020-11-02 NOTE — ED Notes (Addendum)
Spoke with Duwayne Heck, RN at poison control. Recommendations as follows:  Sertraline: monitor for tachycardia, respiratory depression, QTc elongation, and seizures Hydroxyzine: monitor for respiratory depression and QTc elongation Observation minimum 6 hours on tele Fluids for tachycardia Benzos/phenobarb for seizures Intubation/O2 as needed for respiratory depression Repeat EKG in 4 hours If QTc >500, optimize K+ and Mg+ to upper limits Check Mg+ levels  Provider aware, fluids and Mg level ordered

## 2020-11-02 NOTE — ED Notes (Signed)
This RN made aware pt HR elevating to 110-120. Informed provider, received orders for additional liter of saline per poison control recommendation. Continuing to monitor.

## 2020-11-02 NOTE — Progress Notes (Signed)
   11/02/20 2142  Provider Notification  Provider Name/Title Bruna Potter, APP  Date Provider Notified 11/02/20  Time Provider Notified 2142  Notification Type Page (via secure chat)  Notification Reason Other (Comment) (Refused Lovenox SQ.)  Provider response No new orders   Patient verbalized understanding what Lovenox SQ was for. States she will get up and mover around.

## 2020-11-03 ENCOUNTER — Observation Stay (HOSPITAL_COMMUNITY): Payer: Medicaid Other

## 2020-11-03 DIAGNOSIS — Z91018 Allergy to other foods: Secondary | ICD-10-CM | POA: Diagnosis not present

## 2020-11-03 DIAGNOSIS — Z20822 Contact with and (suspected) exposure to covid-19: Secondary | ICD-10-CM | POA: Diagnosis present

## 2020-11-03 DIAGNOSIS — T43592A Poisoning by other antipsychotics and neuroleptics, intentional self-harm, initial encounter: Secondary | ICD-10-CM | POA: Diagnosis present

## 2020-11-03 DIAGNOSIS — T50902A Poisoning by unspecified drugs, medicaments and biological substances, intentional self-harm, initial encounter: Secondary | ICD-10-CM

## 2020-11-03 DIAGNOSIS — F17210 Nicotine dependence, cigarettes, uncomplicated: Secondary | ICD-10-CM | POA: Diagnosis present

## 2020-11-03 DIAGNOSIS — Z79899 Other long term (current) drug therapy: Secondary | ICD-10-CM | POA: Diagnosis not present

## 2020-11-03 DIAGNOSIS — T1491XA Suicide attempt, initial encounter: Secondary | ICD-10-CM | POA: Diagnosis not present

## 2020-11-03 DIAGNOSIS — T43222A Poisoning by selective serotonin reuptake inhibitors, intentional self-harm, initial encounter: Secondary | ICD-10-CM | POA: Diagnosis present

## 2020-11-03 DIAGNOSIS — F411 Generalized anxiety disorder: Secondary | ICD-10-CM | POA: Diagnosis present

## 2020-11-03 DIAGNOSIS — Z9151 Personal history of suicidal behavior: Secondary | ICD-10-CM | POA: Diagnosis not present

## 2020-11-03 DIAGNOSIS — F3160 Bipolar disorder, current episode mixed, unspecified: Secondary | ICD-10-CM | POA: Diagnosis present

## 2020-11-03 DIAGNOSIS — K5900 Constipation, unspecified: Secondary | ICD-10-CM | POA: Diagnosis present

## 2020-11-03 DIAGNOSIS — E876 Hypokalemia: Secondary | ICD-10-CM | POA: Diagnosis present

## 2020-11-03 DIAGNOSIS — R4182 Altered mental status, unspecified: Secondary | ICD-10-CM | POA: Diagnosis present

## 2020-11-03 DIAGNOSIS — T50901A Poisoning by unspecified drugs, medicaments and biological substances, accidental (unintentional), initial encounter: Secondary | ICD-10-CM | POA: Diagnosis present

## 2020-11-03 HISTORY — DX: Poisoning by unspecified drugs, medicaments and biological substances, intentional self-harm, initial encounter: T50.902A

## 2020-11-03 LAB — PREGNANCY, URINE: Preg Test, Ur: NEGATIVE

## 2020-11-03 LAB — BASIC METABOLIC PANEL
Anion gap: 7 (ref 5–15)
BUN: 7 mg/dL (ref 6–20)
CO2: 22 mmol/L (ref 22–32)
Calcium: 8.7 mg/dL — ABNORMAL LOW (ref 8.9–10.3)
Chloride: 112 mmol/L — ABNORMAL HIGH (ref 98–111)
Creatinine, Ser: 0.67 mg/dL (ref 0.44–1.00)
GFR, Estimated: >60 mL/min (ref >60)
Glucose, Bld: 90 mg/dL (ref 70–99)
Potassium: 3.7 mmol/L (ref 3.5–5.1)
Sodium: 141 mmol/L (ref 135–145)

## 2020-11-03 LAB — URINALYSIS, ROUTINE W REFLEX MICROSCOPIC
Bilirubin Urine: NEGATIVE
Glucose, UA: NEGATIVE mg/dL
Hgb urine dipstick: NEGATIVE
Ketones, ur: NEGATIVE mg/dL
Leukocytes,Ua: NEGATIVE
Nitrite: NEGATIVE
Protein, ur: NEGATIVE mg/dL
Specific Gravity, Urine: 1.01 (ref 1.005–1.030)
pH: 5 (ref 5.0–8.0)

## 2020-11-03 LAB — CBC
HCT: 39.2 % (ref 36.0–46.0)
Hemoglobin: 13.1 g/dL (ref 12.0–15.0)
MCH: 32.1 pg (ref 26.0–34.0)
MCHC: 33.4 g/dL (ref 30.0–36.0)
MCV: 96.1 fL (ref 80.0–100.0)
Platelets: 213 10*3/uL (ref 150–400)
RBC: 4.08 MIL/uL (ref 3.87–5.11)
RDW: 12.5 % (ref 11.5–15.5)
WBC: 6.5 10*3/uL (ref 4.0–10.5)
nRBC: 0 % (ref 0.0–0.2)

## 2020-11-03 LAB — MAGNESIUM: Magnesium: 2.1 mg/dL (ref 1.7–2.4)

## 2020-11-03 LAB — RAPID URINE DRUG SCREEN, HOSP PERFORMED
Amphetamines: NOT DETECTED
Barbiturates: NOT DETECTED
Benzodiazepines: NOT DETECTED
Cocaine: NOT DETECTED
Opiates: NOT DETECTED
Tetrahydrocannabinol: NOT DETECTED

## 2020-11-03 MED ORDER — OXYCODONE-ACETAMINOPHEN 5-325 MG PO TABS
1.0000 | ORAL_TABLET | Freq: Four times a day (QID) | ORAL | Status: DC | PRN
  Administered 2020-11-03 (×2): 1 via ORAL
  Filled 2020-11-03 (×2): qty 1

## 2020-11-03 MED ORDER — POLYETHYLENE GLYCOL 3350 17 G PO PACK
17.0000 g | PACK | Freq: Every day | ORAL | Status: DC | PRN
  Administered 2020-11-03 – 2020-11-04 (×2): 17 g via ORAL
  Filled 2020-11-03 (×2): qty 1
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
## 2020-11-03 NOTE — TOC Progression Note (Addendum)
Transition of Care [facility]) - Progression Note    Patient Details  Name: Sharon Ward MRN: [id number] Date of Birth: 1996-03-14  [facility] CM/SW Contact  Darleene Cleaver, [place] Phone Number: 11/03/2020, 6:12 PM  Clinical Narrative:     CSW was informed that patient is under IVC, which expires on November 09, 2020 at 9am.  Once patient is medically ready, CSW to find behavioral health bed for patient.      Expected Discharge Plan and Services  Inpatient psych placement.                                               Social Determinants of Health (SDOH) Interventions    Readmission Risk Interventions No flowsheet data found.

## 2020-11-04 LAB — BASIC METABOLIC PANEL
Anion gap: 9 (ref 5–15)
BUN: 13 mg/dL (ref 6–20)
CO2: 26 mmol/L (ref 22–32)
Calcium: 9.2 mg/dL (ref 8.9–10.3)
Chloride: 106 mmol/L (ref 98–111)
Creatinine, Ser: 0.65 mg/dL (ref 0.44–1.00)
GFR, Estimated: >60 mL/min (ref >60)
Glucose, Bld: 105 mg/dL — ABNORMAL HIGH (ref 70–99)
Potassium: 4.3 mmol/L (ref 3.5–5.1)
Sodium: 141 mmol/L (ref 135–145)

## 2020-11-04 LAB — CBC
HCT: 39.9 % (ref 36.0–46.0)
Hemoglobin: 13.3 g/dL (ref 12.0–15.0)
MCH: 32.3 pg (ref 26.0–34.0)
MCHC: 33.3 g/dL (ref 30.0–36.0)
MCV: 96.8 fL (ref 80.0–100.0)
Platelets: 219 10*3/uL (ref 150–400)
RBC: 4.12 MIL/uL (ref 3.87–5.11)
RDW: 12.2 % (ref 11.5–15.5)
WBC: 5.5 10*3/uL (ref 4.0–10.5)
nRBC: 0 % (ref 0.0–0.2)

## 2020-11-04 MED ORDER — LORAZEPAM 2 MG/ML IJ SOLN
2.0000 mg | INTRAMUSCULAR | Status: DC | PRN
  Administered 2020-11-04 – 2020-11-06 (×6): 2 mg via INTRAVENOUS
  Filled 2020-11-04 (×5): qty 1

## 2020-11-04 MED ORDER — SENNOSIDES-DOCUSATE SODIUM 8.6-50 MG PO TABS
1.0000 | ORAL_TABLET | Freq: Every day | ORAL | Status: DC
  Administered 2020-11-06: 1 via ORAL
  Filled 2020-11-04 (×2): qty 1
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
## 2020-11-04 NOTE — Progress Notes (Signed)
Called to bedside after patient was agitated, restless and fighting.  I attended immediately. Security and [employer] are in her room trying to control her.  She was apparently expecting for a psych follow up and hoping to go home. She is IVCed for recurrent suicidal attempts, last one was 2 weeks ago requiring inpatient psych placement.  I ordered for Ativan 2 mg IV PRN to calm her down. I defer to psych for the management of IVC status and disposition plan. Psych service is aware.  Communicated to bedside RN and charge nurse.
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
## 2020-11-05 ENCOUNTER — Encounter: Payer: Self-pay | Admitting: General Practice

## 2020-11-05 LAB — CBC
HCT: 43.4 % (ref 36.0–46.0)
Hemoglobin: 14.5 g/dL (ref 12.0–15.0)
MCH: 31.9 pg (ref 26.0–34.0)
MCHC: 33.4 g/dL (ref 30.0–36.0)
MCV: 95.4 fL (ref 80.0–100.0)
Platelets: 248 10*3/uL (ref 150–400)
RBC: 4.55 MIL/uL (ref 3.87–5.11)
RDW: 12.2 % (ref 11.5–15.5)
WBC: 7.8 10*3/uL (ref 4.0–10.5)
nRBC: 0 % (ref 0.0–0.2)

## 2020-11-05 LAB — BASIC METABOLIC PANEL
Anion gap: 10 (ref 5–15)
BUN: 17 mg/dL (ref 6–20)
CO2: 23 mmol/L (ref 22–32)
Calcium: 9.9 mg/dL (ref 8.9–10.3)
Chloride: 106 mmol/L (ref 98–111)
Creatinine, Ser: 0.6 mg/dL (ref 0.44–1.00)
GFR, Estimated: >60 mL/min (ref >60)
Glucose, Bld: 101 mg/dL — ABNORMAL HIGH (ref 70–99)
Potassium: 4.2 mmol/L (ref 3.5–5.1)
Sodium: 139 mmol/L (ref 135–145)

## 2020-11-05 MED ORDER — OLANZAPINE 5 MG PO TBDP
5.0000 mg | ORAL_TABLET | Freq: Three times a day (TID) | ORAL | Status: DC | PRN
  Administered 2020-11-05 – 2020-11-06 (×2): 5 mg via ORAL
  Filled 2020-11-05 (×3): qty 1

## 2020-11-05 MED ORDER — DIPHENHYDRAMINE HCL 50 MG/ML IJ SOLN
INTRAMUSCULAR | Status: AC
  Administered 2020-11-05: 25 mg
  Filled 2020-11-05: qty 1

## 2020-11-05 MED ORDER — DIPHENHYDRAMINE HCL 50 MG/ML IJ SOLN: 25.0000 mg | Freq: Once | INTRAMUSCULAR | Status: AC

## 2020-11-05 MED ORDER — LORAZEPAM 2 MG/ML IJ SOLN: 2.0000 mg | Freq: Once | INTRAMUSCULAR | Status: AC

## 2020-11-05 MED ORDER — LORAZEPAM 2 MG/ML IJ SOLN
INTRAMUSCULAR | Status: AC
  Administered 2020-11-05: 2 mg
  Filled 2020-11-05: qty 1

## 2020-11-05 MED ORDER — HALOPERIDOL LACTATE 5 MG/ML IJ SOLN
5.0000 mg | Freq: Four times a day (QID) | INTRAMUSCULAR | Status: DC | PRN
  Administered 2020-11-06: 5 mg via INTRAVENOUS
  Filled 2020-11-05 (×2): qty 1

## 2020-11-05 MED ORDER — HALOPERIDOL LACTATE 5 MG/ML IJ SOLN
INTRAMUSCULAR | Status: AC
  Administered 2020-11-05: 5 mg
  Filled 2020-11-05: qty 1
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
## 2020-11-05 NOTE — Progress Notes (Signed)
Patient with security at bedside. Patient yelling and throwing things after MD came to bedside and reviewed plan of care. Patient not following verbal cues and throwing herself into the bed rails. Psych at bedside, orders received. Medications administered per Dr [place] and [facility] FNP.  Sitter at bedside, will continue to monitor.

## 2020-11-05 NOTE — Plan of Care (Signed)
  Problem: Clinical Measurements: Goal: Diagnostic test results will improve Outcome: Progressing Goal: Respiratory complications will improve Outcome: Progressing Goal: Cardiovascular complication will be avoided Outcome: Progressing   
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
## 2020-11-06 ENCOUNTER — Ambulatory Visit: Payer: Medicaid Other | Admitting: Nurse Practitioner

## 2020-11-06 MED ORDER — DIPHENHYDRAMINE HCL 50 MG/ML IJ SOLN
INTRAMUSCULAR | Status: AC
  Filled 2020-11-06: qty 1

## 2020-11-06 MED ORDER — OLANZAPINE 5 MG PO TBDP
5.0000 mg | ORAL_TABLET | Freq: Two times a day (BID) | ORAL | Status: DC
  Administered 2020-11-06 – 2020-11-07 (×2): 5 mg via ORAL
  Filled 2020-11-06 (×2): qty 1

## 2020-11-06 MED ORDER — DIPHENHYDRAMINE HCL 25 MG PO CAPS: 25.0000 mg | ORAL_CAPSULE | Freq: Four times a day (QID) | ORAL | Status: DC | PRN

## 2020-11-06 MED ORDER — DIPHENHYDRAMINE HCL 25 MG PO CAPS
25.0000 mg | ORAL_CAPSULE | Freq: Two times a day (BID) | ORAL | Status: DC
  Administered 2020-11-06 – 2020-11-07 (×3): 25 mg via ORAL
  Filled 2020-11-06 (×4): qty 1

## 2020-11-06 MED ORDER — DIPHENHYDRAMINE HCL 50 MG/ML IJ SOLN: 12.5000 mg | Freq: Four times a day (QID) | INTRAMUSCULAR | Status: DC | PRN
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
## 2020-11-06 NOTE — TOC Progression Note (Signed)
Transition of Care [facility]) - Progression Note    Patient Details  Name: Sharon Ward MRN: [id number] Date of Birth: 10/27/1995  Transition of Care [facility]) CM/SW Contact  Darleene Cleaver, [place] Phone Number: 11/06/2020, 6:13 PM  Clinical Narrative:     [facility] and [facility] do not have any beds available.  Patient has been referred to [place], [place], [facility], [facility], [facility], [facility] [address], [place], [address], [facility], [place], [facility], [place], [place], [place], [place], [place]. [facility], [place], [facility].  CSW waiting for bed offers.  Referral to [facility] will also be made.    [facility] contacted CSW and said they do not have any beds at this time.  CSW waiting to hear back from other inpatient psych facilities.      Expected Discharge Plan and Services                                                 Social Determinants of Health (SDOH) Interventions    Readmission Risk Interventions No flowsheet data found.

## 2020-11-06 NOTE — Progress Notes (Signed)
Pt IVC for suicide attempt 3/27  Late morning patient began getting frustrated & questioning why she needed inpatient therapy  When she already has outpatient therapy setup. Myself, charge RN Marissa & MD [place] explained to her several times that this was for her safety & that her IVC is court ordered at this time. Also elaborated on the importance of inpatient psychiatric treatment as this would be more beneficial for her & will help get her on a medication regimen along with therapy sessionst to help manage her mental illness in the longrun. Explained to patient that we can't keep trying the same plan & expecting a different result & that her mental health needs to be a priority right now so that she can be safe & more stable-therefore inpatient psychiatric therapy is necessary/required.  Patient then became very agitated, crying, screaming at the top of her lungs at staff, thrashing in bed, hitting herself, kicking bed,climbing up the bed and cursing at staff.  Staff tried to redirect pt calmly & asking her to take deep breaths-allowed pt to call her grandma. Security called to patients room as behavior worsened. IV ativan given Patient's behavior escalated. IV haldol given per order. Meditation music played on computer & tried to verbally calm patient. Patient continues to scream/curse at staff/kicking at bed. Posey bed rail pads applied to bed rails for safety. IV benadryl given. Patient finally calmed down. Sitter remains at bedside. Respiration rate even/unlabored.   Contacted psychiatry & requested that she have some scheduled meds to help prevent these outburst & hopefully minimize the need for PRN's.  Pt fiance Micah called twice today & I spoke with him briefly-told him that we are waiting on an inpatient psychiatric bed to open up & that we are managing her symptoms & keeping her safe until then. Per MD [place], Charge RN Marissa & management he is not allowed detailed information on  her medications etc at this time. Care team supervised a call between patient & fiance 3/31 which ultimately ended in a emotional outburst similar to today. So per psych & MD recommendations at this time they recommend to avoid communication between the two to minimize triggers as we try to stabilize her behavior & keep staff as well as herself safe.  Will continue to monitor.
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
## 2020-11-07 NOTE — TOC Transition Note (Signed)
Transition of Care [facility]) - CM/SW Discharge Note   Patient Details  Name: Catherine Cubero MRN: [id number] Date of Birth: [birth year]  Transition of Care [facility]) CM/SW Contact:  Darleene Cleaver, LCSW Phone Number: 11/07/2020, 11:05 AM   Clinical Narrative:     CSW received a phone call from Delaney Meigs [phone] at [facility], [address] [place], [place], [address], they have a inpatient psych bed available for patient and can accept her today.  Receiving physician is Dr. Estill Cotta.  CSW contacted attending physician, bedside nurse, and psych.  CSW contacted [facility] department to set up transport. Nurse to call report to [phone].  Final next level of care: Psychiatric Hospital Barriers to Discharge: Barriers Resolved   Patient Goals and CMS Choice Patient states their goals for this hospitalization and ongoing recovery are:: Patient wants to go home, but is under IVC, going to inpatient psych.      Discharge Placement                       Discharge Plan and Services                                     Social Determinants of Health (SDOH) Interventions     Readmission Risk Interventions No flowsheet data found.

## 2020-11-10 ENCOUNTER — Telehealth: Payer: Self-pay

## 2020-11-10 NOTE — Telephone Encounter (Signed)
Transition Care Management Unsuccessful Follow-up Telephone Call  Date of discharge and from where:  Sharon Ward on 04/01.2022  Attempts:  1 st attempt   Reason for unsuccessful TCM follow-up call:  Unable to reach patient or leave VM at this time. Will try pt again .

## 2020-11-11 ENCOUNTER — Telehealth: Payer: Self-pay

## 2020-11-11 NOTE — Telephone Encounter (Signed)
Transition Care Management Unsuccessful Follow-up Telephone Call  Date of discharge and from where:  Sharon Ward on 04/01.2022  Attempts:  2nd  attempt   Reason for unsuccessful TCM follow-up call:  Unable to reach patient or leave VM at this time. Pt need to schedule HF/U appt with PCP .

## 2020-11-12 ENCOUNTER — Telehealth: Payer: Self-pay

## 2020-11-12 NOTE — Telephone Encounter (Signed)
Transition Care Management Unsuccessful Follow-up Telephone Call  Date of discharge and from where:  11/07/2020, [facility]   Patient was discharged to another inpatient facility.

## 2020-11-17 ENCOUNTER — Other Ambulatory Visit: Payer: Self-pay

## 2020-11-17 ENCOUNTER — Encounter (HOSPITAL_COMMUNITY): Payer: Self-pay | Admitting: Emergency Medicine

## 2020-11-17 ENCOUNTER — Ambulatory Visit (HOSPITAL_COMMUNITY)
Admission: EM | Admit: 2020-11-17 | Discharge: 2020-11-17 | Disposition: A | Discharge status: Home / Self Care | Payer: Medicaid Other | Attending: Family Medicine | Admitting: Family Medicine

## 2020-11-17 DIAGNOSIS — R002 Palpitations: Secondary | ICD-10-CM | POA: Insufficient documentation

## 2020-11-17 LAB — BASIC METABOLIC PANEL
Anion gap: 8 (ref 5–15)
BUN: 12 mg/dL (ref 6–20)
CO2: 25 mmol/L (ref 22–32)
Calcium: 9.9 mg/dL (ref 8.9–10.3)
Chloride: 106 mmol/L (ref 98–111)
Creatinine, Ser: 0.71 mg/dL (ref 0.44–1.00)
GFR, Estimated: >60 mL/min (ref >60)
Glucose, Bld: 100 mg/dL — ABNORMAL HIGH (ref 70–99)
Potassium: 3.9 mmol/L (ref 3.5–5.1)
Sodium: 139 mmol/L (ref 135–145)

## 2020-11-17 LAB — CBC
HCT: 44.3 % (ref 36.0–46.0)
Hemoglobin: 15 g/dL (ref 12.0–15.0)
MCH: 31.6 pg (ref 26.0–34.0)
MCHC: 33.9 g/dL (ref 30.0–36.0)
MCV: 93.5 fL (ref 80.0–100.0)
Platelets: 246 10*3/uL (ref 150–400)
RBC: 4.74 MIL/uL (ref 3.87–5.11)
RDW: 12.1 % (ref 11.5–15.5)
WBC: 6.1 10*3/uL (ref 4.0–10.5)
nRBC: 0 % (ref 0.0–0.2)

## 2020-11-17 LAB — TSH: TSH: 1.311 u[IU]/mL (ref 0.350–4.500)

## 2020-11-17 NOTE — Discharge Instructions (Addendum)
Avoid caffeine use as this may cause your heart to beat fast or give you a funny feeling in your chest.  You have been seen at the [facility] today for heart palpitations. Your evaluation today was not suggestive of any emergent condition requiring medical intervention at this time. Your ECG (heart tracing) did not show any worrisome changes. However, some medical problems make take more time to appear. Therefore, it's very important that you pay attention to any new symptoms or worsening of your current condition.  Please proceed directly to the Emergency Department immediately should you feel worse in any way or have any of the following symptoms: chest pain, pain that spreads to your arm, neck, jaw, back or abdomen, shortness of breath, or nausea and vomiting.  You have had labs (blood work) drawn today. We will call you with any significant abnormalities or if there is need to begin or change treatment or pursue further follow up.  You may also review your test results online through MyChart. If you do not have a MyChart account, instructions to sign up should be on your discharge paperwork.
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
## 2020-11-17 NOTE — ED Triage Notes (Signed)
patient reports palpitations intermittently for 2 weeks.  Last night had palpitations again and felt like heart was skipping, and patient reports numb hands and difficulty breathing.  175/85, hear rate 74.  Per equipment at home.  Eventually fell asleep  Today reports chest pain. Patient complains of headache Reports sharp pain in chest.  Psychiatrist instructed patient to come to [facility]

## 2020-11-19 ENCOUNTER — Ambulatory Visit (INDEPENDENT_AMBULATORY_CARE_PROVIDER_SITE_OTHER): Payer: Medicaid Other

## 2020-11-19 ENCOUNTER — Other Ambulatory Visit: Payer: Self-pay

## 2020-11-19 DIAGNOSIS — Z3202 Encounter for pregnancy test, result negative: Secondary | ICD-10-CM | POA: Diagnosis not present

## 2020-11-19 LAB — POCT PREGNANCY, URINE: Preg Test, Ur: NEGATIVE

## 2020-11-19 NOTE — Progress Notes (Signed)
Pt scheduled for walk in pregnancy test resulting negative.  Pt informed of results.  Pt reports that she has not had a period since her IUD removal in February 2022.  Pt states that she has been in and out of the hospital and has not been able to reschedule her appt to get another IUD placed as the one she had was hanging out of vagina.  I advised pt that she should schedule an appt so that she can get another IUD.  Pt stated "yes, she would like that".  Pt informed of her appt that is scheduled on 12/03/20.  Pt verbalized understanding.   [name], RN

## 2020-11-24 ENCOUNTER — Other Ambulatory Visit: Payer: Self-pay

## 2020-11-24 ENCOUNTER — Ambulatory Visit: Payer: Medicaid Other | Attending: Nurse Practitioner | Admitting: Nurse Practitioner

## 2020-12-03 ENCOUNTER — Other Ambulatory Visit: Payer: Self-pay

## 2020-12-03 ENCOUNTER — Encounter: Payer: Self-pay | Admitting: Certified Nurse Midwife

## 2020-12-03 ENCOUNTER — Ambulatory Visit (INDEPENDENT_AMBULATORY_CARE_PROVIDER_SITE_OTHER): Payer: Medicaid Other | Admitting: Certified Nurse Midwife

## 2020-12-03 VITALS — BP 111/51 | HR 58 | Wt 184.6 lb

## 2020-12-03 DIAGNOSIS — Z3202 Encounter for pregnancy test, result negative: Secondary | ICD-10-CM

## 2020-12-03 DIAGNOSIS — Z975 Presence of (intrauterine) contraceptive device: Secondary | ICD-10-CM | POA: Diagnosis not present

## 2020-12-03 DIAGNOSIS — Z30430 Encounter for insertion of intrauterine contraceptive device: Secondary | ICD-10-CM

## 2020-12-03 LAB — POCT PREGNANCY, URINE: Preg Test, Ur: NEGATIVE

## 2020-12-03 MED ORDER — PARAGARD INTRAUTERINE COPPER IU IUD: INTRAUTERINE_SYSTEM | Freq: Once | INTRAUTERINE | Status: AC

## 2020-12-04 NOTE — Progress Notes (Signed)
    GYNECOLOGY OFFICE PROCEDURE NOTE  Chidinma Clites is a 25 y.o. G3P1001 here for Paraguard IUD insertion. No GYN concerns.  IUD Insertion Procedure Note Patient identified, informed consent performed, consent signed.   Discussed risks of irregular bleeding, cramping, infection, malpositioning or misplacement of the IUD outside the uterus which may require further procedure such as laparoscopy. Also discussed >99% contraception efficacy, increased risk of ectopic pregnancy with failure of method.   Emphasized that this did not protect against STIs, condoms recommended during all sexual encounters. Time out was performed.  Urine pregnancy test negative.  Speculum placed in the vagina.  Cervix visualized and stabilized well with speculum. Tenaculum not needed. Cleaned with Betadine x 2.  Uterus sounded to 7 cm.  Paraguard IUD placed per manufacturer's recommendations.  Strings trimmed to 3 cm.  Patient tolerated procedure well.   Patient was given post-procedure instructions.  She was advised to have backup contraception for one week.  Patient was also asked to check IUD strings periodically and follow up in 4 weeks for IUD check.  Pap needed at IUD string check.  Edd Arbour, CNM, MSN, IBCLC Certified Nurse Midwife, [facility]

## 2020-12-08 ENCOUNTER — Telehealth: Payer: Self-pay | Admitting: General Practice

## 2020-12-08 ENCOUNTER — Encounter: Payer: Self-pay | Admitting: General Practice

## 2020-12-08 NOTE — Telephone Encounter (Signed)
Patient called and left message on nurse voicemail line stating she had an IUD inserted on Wednesday and wants to know if it is normal to be bleeding a lot. Patient reports bleeding through several pads/underwear.   Called patient and she states at her heaviest she was bleeding through pads/tampons every 3-4 hours. She reports bleeding has slowed down over the weekend. Patient reports LMP 4/14 but had sex almost every day for the past few weeks including Sunday & Monday of last week. She reports passing a few large clots over the weekend. Told patient following initial insertion of an IUD irregular or heavy bleeding can occur at times. Told patient passing clots usually isn't concerning either. Discussed if she would like to take a UPT at home as extra precaution she can. Patient verbalized understanding and asked if the IUD insertion could cause a miscarriage. Told patient if she was already pregnant then yes that would be a possibility but IUDs do not cause miscarriage beyond that. Patient verbalized understanding.
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
## 2020-12-22 ENCOUNTER — Ambulatory Visit: Payer: Medicaid Other | Admitting: Cardiology

## 2020-12-31 ENCOUNTER — Ambulatory Visit: Payer: Medicaid Other | Admitting: Certified Nurse Midwife

## 2021-01-07 ENCOUNTER — Telehealth: Payer: Self-pay | Admitting: Family Medicine

## 2021-01-07 NOTE — Telephone Encounter (Signed)
Returned patients call. Patient reports she has an IUD and has been having intercourse and is painful throughout intercourse. She reports she also has some pain just when sitting.  The pain is on and off every day. She reports it is a cramping pain. She reports that she is bleeding off and on since getting the IUD. She reports she feels the device moved as she could feel it in her lower stomach.   She reports she cannot feel her strings and has not been able to feel for a while.   Reviewed it there can be prolonged or more frequent bleeding for up to 6 months after placement of the IUD.  Per chart review, patient was seen in ED on 5/23 and IUD was noted to be in correct position at that time.   Checked with front desk and soonest patient can be seen is next week, she is on a cancellation list to come in sooner if able.   Will forward to Edd Arbour, CNM who placed device for advisement.

## 2021-01-07 NOTE — Telephone Encounter (Signed)
Patient is having IUD issues and want to get it removed, she has been scheduled for next week, in the mean time she want a nurse to call her. [phone]

## 2021-01-15 ENCOUNTER — Encounter: Payer: Self-pay | Admitting: Obstetrics and Gynecology

## 2021-01-15 ENCOUNTER — Telehealth: Payer: Self-pay | Admitting: Family Medicine

## 2021-01-15 ENCOUNTER — Other Ambulatory Visit: Payer: Self-pay

## 2021-01-15 ENCOUNTER — Ambulatory Visit (INDEPENDENT_AMBULATORY_CARE_PROVIDER_SITE_OTHER): Payer: Medicaid Other | Admitting: Obstetrics and Gynecology

## 2021-01-15 ENCOUNTER — Ambulatory Visit (INDEPENDENT_AMBULATORY_CARE_PROVIDER_SITE_OTHER): Payer: Medicaid Other | Admitting: Clinical

## 2021-01-15 VITALS — BP 120/59 | HR 60 | Wt 174.0 lb

## 2021-01-15 DIAGNOSIS — Z3202 Encounter for pregnancy test, result negative: Secondary | ICD-10-CM | POA: Diagnosis not present

## 2021-01-15 DIAGNOSIS — F411 Generalized anxiety disorder: Secondary | ICD-10-CM

## 2021-01-15 DIAGNOSIS — F331 Major depressive disorder, recurrent, moderate: Secondary | ICD-10-CM

## 2021-01-15 DIAGNOSIS — F3160 Bipolar disorder, current episode mixed, unspecified: Secondary | ICD-10-CM | POA: Diagnosis not present

## 2021-01-15 DIAGNOSIS — Z30432 Encounter for removal of intrauterine contraceptive device: Secondary | ICD-10-CM

## 2021-01-15 DIAGNOSIS — Z658 Other specified problems related to psychosocial circumstances: Secondary | ICD-10-CM | POA: Diagnosis not present

## 2021-01-15 LAB — POCT PREGNANCY, URINE: Preg Test, Ur: NEGATIVE

## 2021-01-15 MED ORDER — SPRINTEC 28 0.25-35 MG-MCG PO TABS: 1.0000 | ORAL_TABLET | Freq: Every day | ORAL | 11 refills | Status: DC

## 2021-01-15 NOTE — Telephone Encounter (Signed)
    Sharon Ward DOB: Feb 14, 1996 MRN: [id number]   RIDER WAIVER AND RELEASE OF LIABILITY  For purposes of improving physical access to our facilities, Sharon Ward is pleased to partner with third parties to provide Sharon Ward patients or other authorized individuals the option of convenient, on-demand ground transportation services (the [employer]") through use of the technology service that enables users to request on-demand ground transportation from independent third-party providers.  By opting to use and accept these Sharon Ward, I, the undersigned, hereby agree on behalf of myself, and on behalf of any minor child using the Sharon Ward for whom I am the parent or legal guardian, as follows:  Sharon Ward are provided by independent third-party transportation providers who are not Sharon Ward or employees and who are unaffiliated with Sharon Ward. Sharon Ward is neither a transportation carrier nor a common or public carrier. Sharon Ward has no control over the quality or safety of the transportation that occurs as a result of the Sharon Ward. Sharon Ward cannot guarantee that any third-party transportation provider will complete any arranged transportation service. Sharon Ward makes no representation, warranty, or guarantee regarding the reliability, timeliness, quality, safety, suitability, or availability of any of the Transport Services or that they will be error free. I fully understand that traveling by vehicle involves risks and dangers of serious bodily injury, including permanent disability, paralysis, and death. I agree, on behalf of myself and on behalf of any minor child using the Transport Services for whom I am the parent or legal guardian, that the entire risk arising out of my use of the Sharon Ward remains solely with Ward, to the maximum extent permitted under applicable law. The Sharon Ward are provided "as  is" and "as available." [place] disclaims all representations and warranties, express, implied or statutory, not expressly set out in these terms, including the implied warranties of merchantability and fitness for a particular purpose. I hereby waive and release Sharon Ward, its agents, employees, officers, directors, representatives, insurers, attorneys, assigns, successors, subsidiaries, and affiliates from any and all past, present, or future claims, demands, liabilities, actions, causes of action, or suits of any kind directly or indirectly arising from acceptance and use of the Sharon Ward. I further waive and release Sharon Ward and its affiliates from all present and future liability and responsibility for any injury or death to persons or damages to property caused by or related to the use of the Sharon Ward. I have read this Waiver and Release of Liability, and I understand the terms used in it and their legal significance. This Waiver is freely and voluntarily given with the understanding that my right (as well as the right of any minor child for whom I am the parent or legal guardian using the Sharon Ward) to legal recourse against [place] in connection with the Sharon Ward is knowingly surrendered in return for use of these services.   I attest that I read the consent document to Sharon Ward, gave Sharon Ward the opportunity to ask questions and answered the questions asked (if any). I affirm that Sharon Ward then provided consent for she's participation in this program.     Sharon Ward

## 2021-01-15 NOTE — Progress Notes (Signed)
   History:  Ms. Shade Rivenbark is a 25 y.o. G3P1001 who presents to clinic today for Paragard IUD removal. No GYN concerns.  Last pap smear was on 05/2019 and was normal. Patient strongly desires paragard IUD removal. Reports ongoing heavy bleeding and cramping with IUD and dislikes the way it makes her feel. UPT negative today. She continues to be sexually active. She would like to restart her OCPs.   Patient also reports ongoing depressive symptoms and expresses interest in finding counselor. She has a long history of depression and bipolar disorder, and a recent suicide attempt in April. She endorses passive suicidal ideation without plan. Denies having any support at home (reports does not feel like she is able to talk to her boyfriend about things). She follows with a psychiatrist but has had difficult getting in to see a therapist.   The following portions of the patient's history were reviewed and updated as appropriate: allergies, current medications, family history, past medical history, social history, past surgical history and problem list.  Review of Systems:  Review of Systems  Gastrointestinal:  Negative for nausea.  Psychiatric/Behavioral:  Positive for depression and suicidal ideas. The patient is nervous/anxious.      Objective:  Physical Exam BP (!) 120/59   Pulse 60   Wt 174 lb (78.9 kg)   BMI 30.82 kg/m  Physical Exam Vitals and nursing note reviewed. Exam conducted with a chaperone present.  Constitutional:      Appearance: Normal appearance.  Genitourinary:    Vagina: Normal.     Cervix: Normal.     Comments: Strings visualized at cervical os, no abnormal discharge or bleeding  Neurological:     Mental Status: She is alert.  Psychiatric:     Comments: Good eye contact, appears anxious but is engaged in conversation. Not responding to internal stimuli. Not exhibiting manic symptoms.       Labs and Imaging Results for orders placed or performed in visit on  01/15/21 (from the past 24 hour(s))  Pregnancy, urine POC     Status: None   Collection Time: 01/15/21  2:29 PM  Result Value Ref Range   Preg Test, Ur NEGATIVE NEGATIVE    No results found.  IUD Removal  Patient identified, informed consent performed, consent signed.  Patient was in the dorsal lithotomy position, normal external genitalia was noted.  A speculum was placed in the patient's vagina, normal discharge was noted, no lesions. The cervix was visualized, no lesions, no abnormal discharge.  The strings of the IUD were grasped and pulled using ring forceps. The IUD was removed in its entirety.  Patient tolerated the procedure well.      Assessment & Plan:  1. Encounter for IUD removal -s/p removal. Neg UPT. Patient will use OCPs for contraception. Routine preventative health maintenance measures emphasized.   2. Moderate episode of recurrent major depressive disorder ([place]) -discussed with patient and did safety assessment. No active SI/Plan. Warm handoff to Roselyn Reef, behavioral health counselor who met with patient at length - Amb ref to [place], MD 01/15/2021 3:09 PM    Sharene Skeans, MD [facility] Family Medicine Fellow, [facility] for [facility], [place]
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
## 2021-01-17 ENCOUNTER — Encounter (HOSPITAL_COMMUNITY): Payer: Self-pay | Admitting: Emergency Medicine

## 2021-01-17 ENCOUNTER — Emergency Department (HOSPITAL_COMMUNITY): Payer: Medicaid Other

## 2021-01-17 ENCOUNTER — Emergency Department (HOSPITAL_COMMUNITY)
Admission: EM | Admit: 2021-01-17 | Discharge: 2021-01-18 | Disposition: A | Discharge status: Home / Self Care | Payer: Medicaid Other | Attending: Emergency Medicine | Admitting: Emergency Medicine

## 2021-01-17 ENCOUNTER — Other Ambulatory Visit: Payer: Self-pay

## 2021-01-17 DIAGNOSIS — Y92009 Unspecified place in unspecified non-institutional (private) residence as the place of occurrence of the external cause: Secondary | ICD-10-CM | POA: Diagnosis not present

## 2021-01-17 DIAGNOSIS — F17210 Nicotine dependence, cigarettes, uncomplicated: Secondary | ICD-10-CM | POA: Diagnosis not present

## 2021-01-17 DIAGNOSIS — J45909 Unspecified asthma, uncomplicated: Secondary | ICD-10-CM | POA: Insufficient documentation

## 2021-01-17 DIAGNOSIS — M25572 Pain in left ankle and joints of left foot: Secondary | ICD-10-CM | POA: Diagnosis present
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
## 2021-01-18 LAB — PREGNANCY, URINE: Preg Test, Ur: NEGATIVE

## 2021-01-18 MED ORDER — ACETAMINOPHEN 325 MG PO TABS
650.0000 mg | ORAL_TABLET | Freq: Once | ORAL | Status: AC
  Administered 2021-01-18: 650 mg via ORAL
  Filled 2021-01-18: qty 2

## 2021-01-18 NOTE — ED Notes (Signed)
Pt was verbally abusive towards staff. Pt states she does not want to go to shelter. Pt was very upset about being discharged. Pt refused vital signs discharged and left without paperwork.

## 2021-01-18 NOTE — ED Notes (Signed)
ASO applied. Paperwork completed.
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
## 2021-01-18 NOTE — TOC Initial Note (Signed)
Transition of Care [facility]) - Initial/Assessment Note    Patient Details  Name: Sharon Ward MRN: [id number] Date of Birth: 1996-01-21  Transition of Care [facility]) CM/SW Contact:    Lockie Pares, RN Phone Number: 01/18/2021, 8:53 AM  Clinical Narrative:                 Patient needs to call [facility] [phone] to give them information on her situation. And see if they have availability        Patient Goals and CMS Choice        Expected Discharge Plan and Services                                                Prior Living Arrangements/Services                       Activities of Daily Living      Permission Sought/Granted                  Emotional Assessment              Admission diagnosis:  broke left foot Patient Active Problem List   Diagnosis Date Noted   Intentional drug overdose (HCC) 11/03/2020   Suicide attempt (HCC) 11/02/2020   Prolonged QT interval 11/02/2020   Hypokalemia 11/02/2020   Major depressive disorder, 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   Bipolar 1 disorder (HCC) 10/25/2020   Left foot pain 10/04/2020   Obesity affecting pregnancy 09/28/2019   Anxiety 09/18/2019   Asthma 09/18/2019   Bipolar disorder (HCC) 09/18/2019   Depression 09/18/2019   LGSIL on Pap smear of cervix 09/18/2019   Oppositional defiant disorder 09/18/2019   H/O psychiatric hospitalization 09/18/2019   Seasonal allergies 09/18/2019   Suicidal ideation 09/18/2019   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 ([address]), [place] - [address] [place] ([place]) [place] [address] Phone: [phone] Fax: [phone]     Social Determinants of Health (SDOH) Interventions    Readmission Risk Interventions No flowsheet data found.
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
## 2021-01-18 NOTE — ED Notes (Signed)
Pt is tearful but does not need anything at this time.

## 2021-01-21 ENCOUNTER — Ambulatory Visit: Payer: Medicaid Other | Admitting: Clinical

## 2021-01-21 DIAGNOSIS — Z91199 Patient's noncompliance with other medical treatment and regimen due to unspecified reason: Secondary | ICD-10-CM

## 2021-01-21 NOTE — BH Specialist Note (Signed)
Pt did not arrive to video visit and did not answer the phone; Left HIPPA-compliant message to call back Exie Chrismer from [facility] at  [phone] (Stevenson Windmiller's office).  ?; left MyChart message for patient.  ? ?

## 2021-01-25 ENCOUNTER — Ambulatory Visit (HOSPITAL_COMMUNITY)
Admission: EM | Admit: 2021-01-25 | Discharge: 2021-01-25 | Disposition: A | Discharge status: Home / Self Care | Payer: Medicaid Other | Attending: Physician Assistant | Admitting: Physician Assistant

## 2021-01-25 ENCOUNTER — Encounter (HOSPITAL_COMMUNITY): Payer: Self-pay

## 2021-01-25 DIAGNOSIS — N898 Other specified noninflammatory disorders of vagina: Secondary | ICD-10-CM | POA: Diagnosis present

## 2021-01-25 DIAGNOSIS — R197 Diarrhea, unspecified: Secondary | ICD-10-CM | POA: Insufficient documentation

## 2021-01-25 DIAGNOSIS — R110 Nausea: Secondary | ICD-10-CM | POA: Insufficient documentation

## 2021-01-25 DIAGNOSIS — R1030 Lower abdominal pain, unspecified: Secondary | ICD-10-CM | POA: Insufficient documentation

## 2021-01-25 LAB — POCT URINALYSIS DIPSTICK, ED / UC
Bilirubin Urine: NEGATIVE
Glucose, UA: NEGATIVE mg/dL
Hgb urine dipstick: NEGATIVE
Leukocytes,Ua: NEGATIVE
Nitrite: NEGATIVE
Protein, ur: NEGATIVE mg/dL
Specific Gravity, Urine: >=1.03 (ref 1.005–1.030)
Urobilinogen, UA: 0.2 mg/dL (ref 0.0–1.0)
pH: 5.5 (ref 5.0–8.0)

## 2021-01-25 LAB — CBC WITH DIFFERENTIAL/PLATELET
Abs Immature Granulocytes: 0.01 10*3/uL (ref 0.00–0.07)
Basophils Absolute: 0 10*3/uL (ref 0.0–0.1)
Basophils Relative: 1 %
Eosinophils Absolute: 0.1 10*3/uL (ref 0.0–0.5)
Eosinophils Relative: 2 %
HCT: 40.3 % (ref 36.0–46.0)
Hemoglobin: 13.6 g/dL (ref 12.0–15.0)
Immature Granulocytes: 0 %
Lymphocytes Relative: 37 %
Lymphs Abs: 1.4 10*3/uL (ref 0.7–4.0)
MCH: 31.6 pg (ref 26.0–34.0)
MCHC: 33.7 g/dL (ref 30.0–36.0)
MCV: 93.5 fL (ref 80.0–100.0)
Monocytes Absolute: 0.4 10*3/uL (ref 0.1–1.0)
Monocytes Relative: 12 %
Neutro Abs: 1.8 10*3/uL (ref 1.7–7.7)
Neutrophils Relative %: 48 %
Platelets: 215 10*3/uL (ref 150–400)
RBC: 4.31 MIL/uL (ref 3.87–5.11)
RDW: 11.8 % (ref 11.5–15.5)
WBC: 3.7 10*3/uL — ABNORMAL LOW (ref 4.0–10.5)
nRBC: 0 % (ref 0.0–0.2)

## 2021-01-25 LAB — HCG, QUANTITATIVE, PREGNANCY: hCG, Beta Chain, Quant, S: <1 m[IU]/mL (ref <5)

## 2021-01-25 LAB — COMPREHENSIVE METABOLIC PANEL
ALT: 17 U/L (ref 0–44)
AST: 21 U/L (ref 15–41)
Albumin: 4.1 g/dL (ref 3.5–5.0)
Alkaline Phosphatase: 53 U/L (ref 38–126)
Anion gap: 5 (ref 5–15)
BUN: 8 mg/dL (ref 6–20)
CO2: 26 mmol/L (ref 22–32)
Calcium: 8.9 mg/dL (ref 8.9–10.3)
Chloride: 103 mmol/L (ref 98–111)
Creatinine, Ser: 0.68 mg/dL (ref 0.44–1.00)
GFR, Estimated: >60 mL/min (ref >60)
Glucose, Bld: 84 mg/dL (ref 70–99)
Potassium: 3.3 mmol/L — ABNORMAL LOW (ref 3.5–5.1)
Sodium: 134 mmol/L — ABNORMAL LOW (ref 135–145)
Total Bilirubin: 0.4 mg/dL (ref 0.3–1.2)
Total Protein: 6.7 g/dL (ref 6.5–8.1)

## 2021-01-25 LAB — POC URINE PREG, ED: Preg Test, Ur: NEGATIVE

## 2021-01-25 MED ORDER — MECLIZINE HCL 25 MG PO TABS: 25.0000 mg | ORAL_TABLET | Freq: Three times a day (TID) | ORAL | 0 refills | Status: DC | PRN
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
## 2021-01-25 NOTE — Discharge Instructions (Signed)
Your urine was normal.  Your urine pregnancy was negative.  We will contact you with your blood work if it is abnormal.  If anything is worsening please return for reevaluation.  Eat a bland diet and drink plenty of fluid.

## 2021-01-25 NOTE — ED Triage Notes (Signed)
Pt in with c/o abdominal pain, vaginal spotting, diarrhea and headaches for the last few days  Pt states her menstrual cycle is 7 days late   Requesting blood test, States she has taken at home test with negative results

## 2021-01-26 LAB — URINE CULTURE: Culture: NO GROWTH

## 2021-01-27 LAB — CERVICOVAGINAL ANCILLARY ONLY
Bacterial Vaginitis (gardnerella): NEGATIVE
Candida Glabrata: NEGATIVE
Candida Vaginitis: POSITIVE — AB
Chlamydia: NEGATIVE
Comment: NEGATIVE
Comment: NEGATIVE
Comment: NEGATIVE
Comment: NEGATIVE
Comment: NEGATIVE
Comment: NORMAL
Neisseria Gonorrhea: NEGATIVE
Trichomonas: NEGATIVE

## 2021-01-28 ENCOUNTER — Telehealth: Payer: Self-pay

## 2021-01-29 ENCOUNTER — Telehealth (HOSPITAL_COMMUNITY): Payer: Self-pay

## 2021-01-29 MED ORDER — CLOTRIMAZOLE 1 % VA CREA: 1.0000 | TOPICAL_CREAM | Freq: Every day | VAGINAL | 0 refills | Status: DC

## 2021-02-04 ENCOUNTER — Ambulatory Visit (HOSPITAL_COMMUNITY)
Admission: EM | Admit: 2021-02-04 | Discharge: 2021-02-05 | Disposition: A | Discharge status: Other Acute Inpatient Hospital | Payer: Medicaid Other | Attending: Nurse Practitioner | Admitting: Nurse Practitioner

## 2021-02-04 ENCOUNTER — Other Ambulatory Visit: Payer: Self-pay

## 2021-02-04 DIAGNOSIS — Z62810 Personal history of physical and sexual abuse in childhood: Secondary | ICD-10-CM | POA: Insufficient documentation

## 2021-02-04 DIAGNOSIS — F17210 Nicotine dependence, cigarettes, uncomplicated: Secondary | ICD-10-CM | POA: Insufficient documentation

## 2021-02-04 DIAGNOSIS — F603 Borderline personality disorder: Secondary | ICD-10-CM | POA: Insufficient documentation

## 2021-02-04 DIAGNOSIS — F3181 Bipolar II disorder: Secondary | ICD-10-CM | POA: Insufficient documentation

## 2021-02-04 DIAGNOSIS — Z20822 Contact with and (suspected) exposure to covid-19: Secondary | ICD-10-CM | POA: Insufficient documentation

## 2021-02-04 DIAGNOSIS — R45851 Suicidal ideations: Secondary | ICD-10-CM | POA: Insufficient documentation

## 2021-02-04 LAB — POCT URINE DRUG SCREEN - MANUAL ENTRY (I-SCREEN)
POC Amphetamine UR: NOT DETECTED
POC Buprenorphine (BUP): NOT DETECTED
POC Cocaine UR: NOT DETECTED
POC Marijuana UR: NOT DETECTED
POC Methadone UR: NOT DETECTED
POC Methamphetamine UR: NOT DETECTED
POC Morphine: NOT DETECTED
POC Oxazepam (BZO): NOT DETECTED
POC Oxycodone UR: NOT DETECTED
POC Secobarbital (BAR): NOT DETECTED

## 2021-02-04 LAB — POC SARS CORONAVIRUS 2 AG -  ED: SARS Coronavirus 2 Ag: NEGATIVE

## 2021-02-04 MED ORDER — SERTRALINE HCL 50 MG PO TABS: 50.0000 mg | ORAL_TABLET | Freq: Every day | ORAL | Status: DC

## 2021-02-04 MED ORDER — LURASIDONE HCL 20 MG PO TABS: 60.0000 mg | ORAL_TABLET | Freq: Every day | ORAL | Status: DC

## 2021-02-04 MED ORDER — OLANZAPINE 2.5 MG PO TABS
2.5000 mg | ORAL_TABLET | Freq: Every day | ORAL | Status: DC
  Administered 2021-02-05: 2.5 mg via ORAL
  Filled 2021-02-04: qty 1

## 2021-02-04 MED ORDER — MAGNESIUM HYDROXIDE 400 MG/5ML PO SUSP: 30.0000 mL | Freq: Every day | ORAL | Status: DC | PRN

## 2021-02-04 MED ORDER — ALUM & MAG HYDROXIDE-SIMETH 200-200-20 MG/5ML PO SUSP: 30.0000 mL | ORAL | Status: DC | PRN

## 2021-02-04 MED ORDER — ACETAMINOPHEN 325 MG PO TABS: 650.0000 mg | ORAL_TABLET | Freq: Four times a day (QID) | ORAL | Status: DC | PRN

## 2021-02-04 NOTE — ED Provider Notes (Signed)
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 Act, 21 U.S.C.section 360bbb-3(b)(1), unless the authorization is terminated  or revoked sooner.      Marland Kitchen  Influenza A by PCR 11/02/2020 NEGATIVE  NEGATIVE Final  . Influenza B by PCR 11/02/2020 NEGATIVE  NEGATIVE Final   Comment: (NOTE) The Xpert Xpress SARS-CoV-2/FLU/RSV plus assay is intended as an aid in the diagnosis of influenza  from Nasopharyngeal swab specimens and should not be used as a sole basis for treatment. Nasal washings and aspirates are unacceptable for Xpert Xpress SARS-CoV-2/FLU/RSV testing.  Fact Sheet for Patients: BloggerCourse.com  Fact Sheet for Healthcare Providers: SeriousBroker.it  This test is not yet approved or cleared by the [place] FDA and has been authorized for detection and/or diagnosis of SARS-CoV-2 by FDA under an Emergency Use Authorization (EUA). This EUA will remain in effect (meaning this test can be used) for the duration of the COVID-19 declaration under Section 564(b)(1) of the Act, 21 U.S.C. section 360bbb-3(b)(1), unless the authorization is terminated or revoked.  Performed at [facility], [address] [place], [place] [address]   . Sodium 11/02/2020 139  135 - 145 mmol/L Final  . Potassium 11/02/2020 4.1  3.5 - 5.1 mmol/L Final   Comment: DELTA CHECK NOTED NO VISIBLE HEMOLYSIS   . Chloride 11/02/2020 112 (A) 98 - 111 mmol/L Final  . CO2 11/02/2020 21 (A) 22 - 32 mmol/L Final  . Glucose, Bld 11/02/2020 90  70 - 99 mg/dL Final   Glucose reference range applies only to samples taken after fasting for at least 8 hours.  . BUN 11/02/2020 9  6 - 20 mg/dL Final  . Creatinine, Ser 11/02/2020 0.68  0.44 - 1.00 mg/dL Final  . Calcium 60/45/4098 8.5 (A) 8.9 - 10.3 mg/dL Final  . GFR, Estimated 11/02/2020 >60  >60 mL/min Final   Comment: (NOTE) Calculated using the CKD-EPI Creatinine Equation (2021)   . Anion gap 11/02/2020 6  5 - 15 Final   Performed at [facility], [address], [place], [place] [address]  . Magnesium 11/02/2020 2.2  1.7 - 2.4 mg/dL Final   Performed at [facility], [address] [place], [place] [address]  . Sodium 11/03/2020 141  135 - 145 mmol/L Final  . Potassium 11/03/2020 3.7  3.5 - 5.1 mmol/L Final  . Chloride 11/03/2020  112 (A) 98 - 111 mmol/L Final  . CO2 11/03/2020 22  22 - 32 mmol/L Final  . Glucose, Bld 11/03/2020 90  70 - 99 mg/dL Final   Glucose reference range applies only to samples taken after fasting for at least 8 hours.  . BUN 11/03/2020 7  6 - 20 mg/dL Final  . Creatinine, Ser 11/03/2020 0.67  0.44 - 1.00 mg/dL Final  . Calcium 62/13/0865 8.7 (A) 8.9 - 10.3 mg/dL Final  . GFR, Estimated 11/03/2020 >60  >60 mL/min Final   Comment: (NOTE) Calculated using the CKD-EPI Creatinine Equation (2021)   . Anion gap 11/03/2020 7  5 - 15 Final   Performed at [facility], [address], [place], [place] [address]  . WBC 11/03/2020 6.5  4.0 - 10.5 K/uL Final  . RBC 11/03/2020 4.08  3.87 - 5.11 MIL/uL Final  . Hemoglobin 11/03/2020 13.1  12.0 - 15.0 g/dL Final  . HCT 62/95/2841 39.2  36.0 - 46.0 % Final  . MCV 11/03/2020 96.1  80.0 - 100.0 fL Final  . MCH 11/03/2020 32.1  26.0 - 34.0 pg Final  . MCHC 11/03/2020 33.4  30.0 - 36.0 g/dL Final  . RDW 32/44/0102 12.5  11.5 - 15.5 % Final  .  Platelets 11/03/2020 213  150 - 400 K/uL Final  . nRBC 11/03/2020 0.0  0.0 - 0.2 % Final   Performed at [facility], [address], [place], [place] [address]  . Magnesium 11/03/2020 2.1  1.7 - 2.4 mg/dL Final   Performed at [facility], [address], [place], [place] [address]  . Preg Test, Ur 11/03/2020 NEGATIVE  NEGATIVE Final   Comment:        THE SENSITIVITY OF THIS METHODOLOGY IS >20 mIU/mL. Performed at [facility], [address], [place], [place] [address]   . Color, Urine 11/03/2020 YELLOW  YELLOW Final  . APPearance 11/03/2020 CLEAR  CLEAR Final  . Specific Gravity, Urine 11/03/2020 1.010  1.005 - 1.030 Final  . pH 11/03/2020 5.0  5.0 - 8.0 Final  . Glucose, UA 11/03/2020 NEGATIVE  NEGATIVE mg/dL Final  . Hgb urine dipstick 11/03/2020 NEGATIVE  NEGATIVE Final  . Bilirubin Urine 11/03/2020 NEGATIVE  NEGATIVE Final  .  Ketones, ur 11/03/2020 NEGATIVE  NEGATIVE mg/dL Final  . Protein, ur 84/13/2440 NEGATIVE  NEGATIVE mg/dL Final  . Nitrite 06/05/2535 NEGATIVE  NEGATIVE Final  . Glori Luis 11/03/2020 NEGATIVE  NEGATIVE Final   Performed at [facility], [address] [place], [place] [address]  . Sodium 11/04/2020 141  135 - 145 mmol/L Final  . Potassium 11/04/2020 4.3  3.5 - 5.1 mmol/L Final  . Chloride 11/04/2020 106  98 - 111 mmol/L Final  . CO2 11/04/2020 26  22 - 32 mmol/L Final  . Glucose, Bld 11/04/2020 105 (A) 70 - 99 mg/dL Final   Glucose reference range applies only to samples taken after fasting for at least 8 hours.  . BUN 11/04/2020 13  6 - 20 mg/dL Final  . Creatinine, Ser 11/04/2020 0.65  0.44 - 1.00 mg/dL Final  . Calcium 47/42/5956 9.2  8.9 - 10.3 mg/dL Final  . GFR, Estimated 11/04/2020 >60  >60 mL/min Final   Comment: (NOTE) Calculated using the CKD-EPI Creatinine Equation (2021)   . Anion gap 11/04/2020 9  5 - 15 Final   Performed at [facility], [address], [place], [place] [address]  . WBC 11/04/2020 5.5  4.0 - 10.5 K/uL Final  . RBC 11/04/2020 4.12  3.87 - 5.11 MIL/uL Final  . Hemoglobin 11/04/2020 13.3  12.0 - 15.0 g/dL Final  . HCT 43/32/9518 39.9  36.0 - 46.0 % Final  . MCV 11/04/2020 96.8  80.0 - 100.0 fL Final  . MCH 11/04/2020 32.3  26.0 - 34.0 pg Final  . MCHC 11/04/2020 33.3  30.0 - 36.0 g/dL Final  . RDW 84/16/6063 12.2  11.5 - 15.5 % Final  . Platelets 11/04/2020 219  150 - 400 K/uL Final  . nRBC 11/04/2020 0.0  0.0 - 0.2 % Final   Performed at [facility], [address], [place], [place] [address]  . Sodium 11/05/2020 139  135 - 145 mmol/L Final  . Potassium 11/05/2020 4.2  3.5 - 5.1 mmol/L Final  . Chloride 11/05/2020 106  98 - 111 mmol/L Final  . CO2 11/05/2020 23  22 - 32 mmol/L Final  . Glucose, Bld 11/05/2020 101 (A) 70 - 99 mg/dL Final   Glucose reference range applies only to samples taken  after fasting for at least 8 hours.  . BUN 11/05/2020 17  6 - 20 mg/dL Final  . Creatinine, Ser 11/05/2020 0.60  0.44 - 1.00 mg/dL Final  .  Calcium 11/05/2020 9.9  8.9 - 10.3 mg/dL Final  . GFR, Estimated 11/05/2020 >60  >60 mL/min Final   Comment: (NOTE) Calculated using the CKD-EPI Creatinine Equation (2021)   . Anion gap 11/05/2020 10  5 - 15 Final   Performed at [facility], [address] [place], [place] [address]  . WBC 11/05/2020 7.8  4.0 - 10.5 K/uL Final  . RBC 11/05/2020 4.55  3.87 - 5.11 MIL/uL Final  . Hemoglobin 11/05/2020 14.5  12.0 - 15.0 g/dL Final  . HCT 60/45/409803/30/2022 43.4  36.0 - 46.0 % Final  . MCV 11/05/2020 95.4  80.0 - 100.0 fL Final  . MCH 11/05/2020 31.9  26.0 - 34.0 pg Final  . MCHC 11/05/2020 33.4  30.0 - 36.0 g/dL Final  . RDW 11/91/478203/30/2022 12.2  11.5 - 15.5 % Final  . Platelets 11/05/2020 248  150 - 400 K/uL Final  . nRBC 11/05/2020 0.0  0.0 - 0.2 % Final   Performed at [facility], [address] [place], [place] [address]  Admission on 10/25/2020, Discharged on 10/28/2020  Component Date Value Ref Range Status  . TSH 10/27/2020 5.929 (A) 0.350 - 4.500 uIU/mL Final   Comment: Performed by a 3rd Generation assay with a functional sensitivity of <=0.01 uIU/mL. Performed at [facility], [address] [place], [place] [address]   . Cholesterol 10/27/2020 227 (A) 0 - 200 mg/dL Final  . Triglycerides 10/27/2020 85  <150 mg/dL Final  . HDL 78/46/962903/21/2022 84  >40 mg/dL Final  . Total CHOL/HDL Ratio 10/27/2020 2.7  RATIO Final  . VLDL 10/27/2020 17  0 - 40 mg/dL Final  . LDL Cholesterol 10/27/2020 126 (A) 0 - 99 mg/dL Final   Comment:        Total Cholesterol/HDL:CHD Risk Coronary Heart Disease Risk Table                     Men   Women  1/2 Average Risk   3.4   3.3  Average Risk       5.0   4.4  2 X Average Risk   9.6   7.1  3 X Average Risk  23.4   11.0        Use the calculated Patient Ratio above  and the CHD Risk Table to determine the patient's CHD Risk.        ATP III CLASSIFICATION (LDL):  <100     mg/dL   Optimal  [phone]  mg/dL   Near or Above                    Optimal  130-159  mg/dL   Borderline  [phone]  mg/dL   High  >272>190     mg/dL   Very High Performed at [facility], [address] [place], [place] [address]   . Free T4 10/27/2020 0.82  0.61 - 1.12 ng/dL Final   Comment: (NOTE) Biotin ingestion may interfere with free T4 tests. If the results are inconsistent with the TSH level, previous test results, or the clinical presentation, then consider biotin interference. If needed, order repeat testing after stopping biotin. Performed at [facility], [address] [place], [place] [address]   . T3 Uptake Ratio 10/27/2020 19 (A) 24 - 39 % Final   Comment: (NOTE) Performed At: [facility] [address] [place], [place] [id number] Jolene SchimkeNagendra Sanjai MD FI:[id number]:[phone]   Admission  on 10/24/2020, Discharged on 10/25/2020  Component Date Value Ref Range Status  . Glucose-Capillary 10/24/2020 92  70 - 99 mg/dL Final   Glucose reference range applies only to samples taken after fasting for at least 8 hours.  . Alcohol, Ethyl (B) 10/24/2020 <10  <10 mg/dL Final   Comment: (NOTE) Lowest detectable limit for serum alcohol is 10 mg/dL.  For medical purposes only. Performed at [facility], [address] [place], [place] [address]   . Opiates 10/24/2020 NONE DETECTED  NONE DETECTED Final  . Cocaine 10/24/2020 NONE DETECTED  NONE DETECTED Final  . Benzodiazepines 10/24/2020 NONE DETECTED  NONE DETECTED Final  . Amphetamines 10/24/2020 NONE DETECTED  NONE DETECTED Final  . Tetrahydrocannabinol 10/24/2020 NONE DETECTED  NONE DETECTED Final  . Barbiturates 10/24/2020 NONE DETECTED  NONE DETECTED Final   Comment: (NOTE) DRUG SCREEN FOR MEDICAL PURPOSES ONLY.  IF CONFIRMATION IS NEEDED FOR ANY PURPOSE, NOTIFY  LAB WITHIN 5 DAYS.  LOWEST DETECTABLE LIMITS FOR URINE DRUG SCREEN Drug Class                     Cutoff (ng/mL) Amphetamine and metabolites    1000 Barbiturate and metabolites    200 Benzodiazepine                 200 Tricyclics and metabolites     300 Opiates and metabolites        300 Cocaine and metabolites        300 THC                            50 Performed at [facility], [address], [place], [place] [address]   . I-stat hCG, quantitative 10/24/2020 <5.0  <5 mIU/mL Final  . Comment 3 10/24/2020          Final   Comment:   GEST. AGE      CONC.  (mIU/mL)   <=1 WEEK        5 - 50     2 WEEKS       50 - 500     3 WEEKS       100 - 10,000     4 WEEKS     1,000 - 30,000        FEMALE AND NON-PREGNANT FEMALE:     LESS THAN 5 mIU/mL   . Sodium 10/24/2020 137  135 - 145 mmol/L Final  . Potassium 10/24/2020 3.6  3.5 - 5.1 mmol/L Final  . Chloride 10/24/2020 104  98 - 111 mmol/L Final  . CO2 10/24/2020 22  22 - 32 mmol/L Final  . Glucose, Bld 10/24/2020 95  70 - 99 mg/dL Final   Glucose reference range applies only to samples taken after fasting for at least 8 hours.  . BUN 10/24/2020 13  6 - 20 mg/dL Final  . Creatinine, Ser 10/24/2020 0.65  0.44 - 1.00 mg/dL Final  . Calcium 34/74/2595 9.2  8.9 - 10.3 mg/dL Final  . GFR, Estimated 10/24/2020 >60  >60 mL/min Final   Comment: (NOTE) Calculated using the CKD-EPI Creatinine Equation (2021)   . Anion gap 10/24/2020 11  5 - 15 Final   Performed at [facility], [address] [place], [place] [address]  . Acetaminophen (Tylenol), Serum 10/24/2020 <10 (A) 10 - 30 ug/mL Final   Comment: (NOTE) Therapeutic concentrations vary significantly. A range  of 10-30 ug/mL  may be an effective concentration for many patients. However, some  are best treated at concentrations outside of this range. Acetaminophen concentrations >150 ug/mL at 4 hours after ingestion  and >50 ug/mL at 12 hours after  ingestion are often associated with  toxic reactions.  Performed at [facility], [address], [place], [place] [address]   . Salicylate Lvl 10/24/2020 <7.0 (A) 7.0 - 30.0 mg/dL Final   Performed at [facility], [address] [place], [place] [address]  . WBC 10/24/2020 8.8  4.0 - 10.5 K/uL Final  . RBC 10/24/2020 4.14  3.87 - 5.11 MIL/uL Final  . Hemoglobin 10/24/2020 13.3  12.0 - 15.0 g/dL Final  . HCT 09/81/1914 39.3  36.0 - 46.0 % Final  . MCV 10/24/2020 94.9  80.0 - 100.0 fL Final  . MCH 10/24/2020 32.1  26.0 - 34.0 pg Final  . MCHC 10/24/2020 33.8  30.0 - 36.0 g/dL Final  . RDW 78/29/5621 12.5  11.5 - 15.5 % Final  . Platelets 10/24/2020 211  150 - 400 K/uL Final  . nRBC 10/24/2020 0.0  0.0 - 0.2 % Final  . Neutrophils Relative % 10/24/2020 75  % Final  . Neutro Abs 10/24/2020 6.6  1.7 - 7.7 K/uL Final  . Lymphocytes Relative 10/24/2020 17  % Final  . Lymphs Abs 10/24/2020 1.5  0.7 - 4.0 K/uL Final  . Monocytes Relative 10/24/2020 7  % Final  . Monocytes Absolute 10/24/2020 0.6  0.1 - 1.0 K/uL Final  . Eosinophils Relative 10/24/2020 1  % Final  . Eosinophils Absolute 10/24/2020 0.0  0.0 - 0.5 K/uL Final  . Basophils Relative 10/24/2020 0  % Final  . Basophils Absolute 10/24/2020 0.0  0.0 - 0.1 K/uL Final  . Immature Granulocytes 10/24/2020 0  % Final  . Abs Immature Granulocytes 10/24/2020 0.03  0.00 - 0.07 K/uL Final   Performed at [facility], [address], [place], [place] [address]  . SARS Coronavirus 2 by RT PCR 10/24/2020 NEGATIVE  NEGATIVE Final   Comment: (NOTE) SARS-CoV-2 target nucleic acids are NOT DETECTED.  The SARS-CoV-2 RNA is generally detectable in upper respiratory specimens during the acute phase of infection. The lowest concentration of SARS-CoV-2 viral copies this assay can detect is 138 copies/mL. A negative result does not preclude SARS-Cov-2 infection and should not be used as the  sole basis for treatment or other patient management decisions. A negative result may occur with  improper specimen collection/handling, submission of specimen other than nasopharyngeal swab, presence of viral mutation(s) within the areas targeted by this assay, and inadequate number of viral copies(<138 copies/mL). A negative result must be combined with clinical observations, patient history, and epidemiological information. The expected result is Negative.  Fact Sheet for Patients:  BloggerCourse.com  Fact Sheet for Healthcare Providers:  SeriousBroker.it  This test is no                          t yet approved or cleared by the [place] FDA and  has been authorized for detection and/or diagnosis of SARS-CoV-2 by FDA under an Emergency Use Authorization (EUA). This EUA will remain  in effect (meaning this test can be used) for the duration of the COVID-19 declaration under Section 564(b)(1) of the Act, 21 U.S.C.section 360bbb-3(b)(1), unless the authorization is terminated  or revoked sooner.      . Influenza A by  PCR 10/24/2020 NEGATIVE  NEGATIVE Final  . Influenza B by PCR 10/24/2020 NEGATIVE  NEGATIVE Final   Comment: (NOTE) The Xpert Xpress SARS-CoV-2/FLU/RSV plus assay is intended as an aid in the diagnosis of influenza from Nasopharyngeal swab specimens and should not be used as a sole basis for treatment. Nasal washings and aspirates are unacceptable for Xpert Xpress SARS-CoV-2/FLU/RSV testing.  Fact Sheet for Patients: BloggerCourse.com  Fact Sheet for Healthcare Providers: SeriousBroker.it  This test is not yet approved or cleared by the [place] FDA and has been authorized for detection and/or diagnosis of SARS-CoV-2 by FDA under an Emergency Use Authorization (EUA). This EUA will remain in effect (meaning this test can be used) for the duration of  the COVID-19 declaration under Section 564(b)(1) of the Act, 21 U.S.C. section 360bbb-3(b)(1), unless the authorization is terminated or revoked.  Performed at [facility], [address] [place], [place] [address]   There may be more visits with results that are not included.    Allergies: Ascorbate, Citrus, Coconut flavor, Lamotrigine, Orange (diagnostic), Peach flavor, Pear, and Pineapple  PTA Medications: (Not in a hospital admission)   Medical Decision Making  Admit to continuous assessment for crisis stabilization  Continue home medications -Latuda 60 mg daily for bipolar depression -Olanzapine 2-1/2 mg nightly for sleep/mood stability -Sertraline 50 mg daily for depression    Clinical Course as of 02/05/21 0341  Wed Feb 04, 2021  2355 POCT Urine Drug Screen - (ICup) UDS negative [JB]  Thu Feb 05, 2021  0206 POC Methadone UR: None Detected [JB]  0307 CBC with Differential/Platelet CBC unremarkable [JB]  0307 SARS Coronavirus 2 by RT PCR: NEGATIVE [JB]  0307 Preg Test, Ur: NEGATIVE [JB]  0338 Lipid panel Lipid Panel Unremarkable [JB]    Clinical Course User Index [JB] Jackelyn Poling, NP    Recommendations  Based on my evaluation the patient does not appear to have an emergency medical condition.  Patient has been accepted for inpatient psychiatric treatment at [facility].  Jackelyn Poling, NP 02/04/21  11:53 PM
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
## 2021-02-05 ENCOUNTER — Inpatient Hospital Stay (HOSPITAL_COMMUNITY)
Admission: AD | Admit: 2021-02-05 | Discharge: 2021-02-10 | DRG: 885 | Disposition: A | Discharge status: Home / Self Care | Payer: Medicaid Other | Source: Intra-hospital | Attending: Psychiatry | Admitting: Psychiatry

## 2021-02-05 ENCOUNTER — Encounter (HOSPITAL_COMMUNITY): Payer: Self-pay | Admitting: Nurse Practitioner

## 2021-02-05 DIAGNOSIS — Z9151 Personal history of suicidal behavior: Secondary | ICD-10-CM | POA: Diagnosis not present

## 2021-02-05 DIAGNOSIS — R45851 Suicidal ideations: Secondary | ICD-10-CM | POA: Diagnosis present

## 2021-02-05 DIAGNOSIS — Z20822 Contact with and (suspected) exposure to covid-19: Secondary | ICD-10-CM | POA: Diagnosis present

## 2021-02-05 DIAGNOSIS — F17210 Nicotine dependence, cigarettes, uncomplicated: Secondary | ICD-10-CM | POA: Diagnosis present

## 2021-02-05 DIAGNOSIS — F419 Anxiety disorder, unspecified: Secondary | ICD-10-CM | POA: Diagnosis present

## 2021-02-05 DIAGNOSIS — G4700 Insomnia, unspecified: Secondary | ICD-10-CM | POA: Diagnosis present

## 2021-02-05 DIAGNOSIS — F3181 Bipolar II disorder: Principal | ICD-10-CM | POA: Diagnosis present

## 2021-02-05 DIAGNOSIS — F411 Generalized anxiety disorder: Secondary | ICD-10-CM | POA: Diagnosis present

## 2021-02-05 LAB — CBC WITH DIFFERENTIAL/PLATELET
Abs Immature Granulocytes: 0.02 10*3/uL (ref 0.00–0.07)
Basophils Absolute: 0 10*3/uL (ref 0.0–0.1)
Basophils Relative: 1 %
Eosinophils Absolute: 0 10*3/uL (ref 0.0–0.5)
Eosinophils Relative: 1 %
HCT: 38.7 % (ref 36.0–46.0)
Hemoglobin: 13.5 g/dL (ref 12.0–15.0)
Immature Granulocytes: 0 %
Lymphocytes Relative: 34 %
Lymphs Abs: 2.8 10*3/uL (ref 0.7–4.0)
MCH: 32.1 pg (ref 26.0–34.0)
MCHC: 34.9 g/dL (ref 30.0–36.0)
MCV: 91.9 fL (ref 80.0–100.0)
Monocytes Absolute: 0.6 10*3/uL (ref 0.1–1.0)
Monocytes Relative: 7 %
Neutro Abs: 4.9 10*3/uL (ref 1.7–7.7)
Neutrophils Relative %: 57 %
Platelets: 292 10*3/uL (ref 150–400)
RBC: 4.21 MIL/uL (ref 3.87–5.11)
RDW: 11.9 % (ref 11.5–15.5)
WBC: 8.4 10*3/uL (ref 4.0–10.5)
nRBC: 0 % (ref 0.0–0.2)

## 2021-02-05 LAB — LIPID PANEL
Cholesterol: 160 mg/dL (ref 0–200)
HDL: 61 mg/dL (ref >40)
LDL Cholesterol: 86 mg/dL (ref 0–99)
Total CHOL/HDL Ratio: 2.6 RATIO
Triglycerides: 63 mg/dL (ref <150)
VLDL: 13 mg/dL (ref 0–40)

## 2021-02-05 LAB — COMPREHENSIVE METABOLIC PANEL
ALT: 20 U/L (ref 0–44)
AST: 25 U/L (ref 15–41)
Albumin: 4.3 g/dL (ref 3.5–5.0)
Alkaline Phosphatase: 47 U/L (ref 38–126)
Anion gap: 10 (ref 5–15)
BUN: 13 mg/dL (ref 6–20)
CO2: 22 mmol/L (ref 22–32)
Calcium: 9.5 mg/dL (ref 8.9–10.3)
Chloride: 103 mmol/L (ref 98–111)
Creatinine, Ser: 0.78 mg/dL (ref 0.44–1.00)
GFR, Estimated: >60 mL/min (ref >60)
Glucose, Bld: 93 mg/dL (ref 70–99)
Potassium: 4.1 mmol/L (ref 3.5–5.1)
Sodium: 135 mmol/L (ref 135–145)
Total Bilirubin: 0.5 mg/dL (ref 0.3–1.2)
Total Protein: 6.7 g/dL (ref 6.5–8.1)

## 2021-02-05 LAB — HEMOGLOBIN A1C
Hgb A1c MFr Bld: 4.9 % (ref 4.8–5.6)
Mean Plasma Glucose: 94 mg/dL

## 2021-02-05 LAB — RESP PANEL BY RT-PCR (FLU A&B, COVID) ARPGX2
Influenza A by PCR: NEGATIVE
Influenza B by PCR: NEGATIVE
SARS Coronavirus 2 by RT PCR: NEGATIVE

## 2021-02-05 LAB — POCT PREGNANCY, URINE: Preg Test, Ur: NEGATIVE

## 2021-02-05 LAB — T4, FREE: Free T4: 0.81 ng/dL (ref 0.61–1.12)

## 2021-02-05 LAB — TSH: TSH: 0.324 u[IU]/mL — ABNORMAL LOW (ref 0.350–4.500)

## 2021-02-05 LAB — ETHANOL: Alcohol, Ethyl (B): <10 mg/dL (ref <10)

## 2021-02-05 MED ORDER — OLANZAPINE 2.5 MG PO TABS
2.5000 mg | ORAL_TABLET | Freq: Every day | ORAL | Status: DC
  Filled 2021-02-05: qty 1

## 2021-02-05 MED ORDER — LURASIDONE HCL 60 MG PO TABS
60.0000 mg | ORAL_TABLET | Freq: Every day | ORAL | Status: DC
  Administered 2021-02-05 – 2021-02-10 (×6): 60 mg via ORAL
  Filled 2021-02-05 (×2): qty 3
  Filled 2021-02-05: qty 7
  Filled 2021-02-05: qty 3
  Filled 2021-02-05: qty 7
  Filled 2021-02-05 (×4): qty 3

## 2021-02-05 MED ORDER — MELATONIN 5 MG PO TABS
5.0000 mg | ORAL_TABLET | Freq: Every day | ORAL | Status: DC
  Administered 2021-02-05 – 2021-02-09 (×5): 5 mg via ORAL
  Filled 2021-02-05 (×3): qty 1
  Filled 2021-02-05: qty 7
  Filled 2021-02-05 (×2): qty 1
  Filled 2021-02-05: qty 7
  Filled 2021-02-05: qty 1

## 2021-02-05 MED ORDER — MAGNESIUM HYDROXIDE 400 MG/5ML PO SUSP
30.0000 mL | Freq: Every day | ORAL | Status: DC | PRN
  Administered 2021-02-06 – 2021-02-07 (×2): 30 mL via ORAL
  Filled 2021-02-05 (×2): qty 30

## 2021-02-05 MED ORDER — SERTRALINE HCL 50 MG PO TABS
50.0000 mg | ORAL_TABLET | Freq: Every day | ORAL | Status: DC
  Administered 2021-02-05 – 2021-02-10 (×6): 50 mg via ORAL
  Filled 2021-02-05: qty 7
  Filled 2021-02-05 (×8): qty 1
  Filled 2021-02-05: qty 7

## 2021-02-05 MED ORDER — MELATONIN 3 MG PO TABS: 3.0000 mg | ORAL_TABLET | Freq: Every evening | ORAL | Status: DC | PRN

## 2021-02-05 MED ORDER — ACETAMINOPHEN 325 MG PO TABS: 650.0000 mg | ORAL_TABLET | Freq: Four times a day (QID) | ORAL | Status: DC | PRN

## 2021-02-05 MED ORDER — ALUM & MAG HYDROXIDE-SIMETH 200-200-20 MG/5ML PO SUSP: 30.0000 mL | ORAL | Status: DC | PRN

## 2021-02-05 NOTE — Progress Notes (Signed)
CSW called patient alliance housing caseworker, Elmarie Shiley, for collateral information and possible discharge plans. Tiffany reports that patient has an open CPS report and she had failed to appear to court for a restraining order she filed for her and her baby to the father of the baby.  Patient had disclosed with this CSW that baby was currently with the father.  CSW shared that information with caseworker.  Caseworker agreed that she would be calling CPS back to provide some information about status of patient and potential status of where the child is. CSW provided case manager with contact information in case CPS needed information from this CSW.     Tiffany requested that patient call her regarding housing opportunities for after discharge.  Tiffany reported that she will not be there on the July 4th holiday or the week following July 4th but she could provide patient with contact number to assist for housing.    Adaline Trejos, LCSW, LCAS Clincal Social Worker  [facility]

## 2021-02-05 NOTE — Progress Notes (Signed)
Adult Psychoeducational Group Note  Date:  02/05/2021 Time:  10:22 AM  Group Topic/Focus:  Goals Group:   The focus of this group is to help patients establish daily goals to achieve during treatment and discuss how the patient can incorporate goal setting into their daily lives to aide in recovery.  Participation Level:  Active  Participation Quality:  Appropriate and Attentive  Affect:  Appropriate  Cognitive:  Alert and Appropriate  Insight: Appropriate and Good  Engagement in Group:  Engaged  Modes of Intervention:  Discussion  Additional Comments:  Pt has a goal today of positive thinking, and finding some things that make her happy.   [name] 02/05/2021, 10:22 AM

## 2021-02-05 NOTE — Progress Notes (Signed)
NUTRITION ASSESSMENT  Pt identified as at risk on the Malnutrition Screen Tool  INTERVENTION: 1. Encourage PO intakes  NUTRITION DIAGNOSIS: Unintentional weight loss related to sub-optimal intake as evidenced by pt report.   Goal: Pt to meet >/= 90% of their estimated nutrition needs.  Monitor:  PO intake  Assessment:  Patient admitted for bipolar disorder and borderline personality disorder. Pt reports poor appetite in MST screen. Per weight records, pt has lost 11 lbs since 4/27 (5% wt loss x 2 months, insignificant for time frame).   Height: Ht Readings from Last 1 Encounters:  02/05/21 5\' 4"  (1.626 m)    Weight: Wt Readings from Last 1 Encounters:  02/05/21 78.9 kg    Weight Hx: Wt Readings from Last 10 Encounters:  02/05/21 78.9 kg  01/15/21 78.9 kg  12/03/20 83.7 kg  11/02/20 79.4 kg  10/25/20 82.6 kg  10/24/20 79.4 kg  10/03/20 79.4 kg  09/15/20 81.8 kg  09/11/20 82.1 kg  08/27/20 83.3 kg    BMI:  Body mass index is 29.87 kg/m. Pt meets criteria for overweight based on current BMI.  Estimated Nutritional Needs: Kcal: 25-30 kcal/kg Protein: > 1 gram protein/kg Fluid: 1 ml/kcal  Diet Order:  Diet Order             Diet regular Room service appropriate? Yes; Fluid consistency: Thin  Diet effective now                  Pt is also offered choice of unit snacks mid-morning and mid-afternoon.  Pt is eating as desired.   Lab results and medications reviewed.   08/29/20, MS, RD, LDN Inpatient Clinical Dietitian Contact information available via Amion
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
## 2021-02-05 NOTE — Progress Notes (Signed)
   02/05/21 1045  Psych Admission Type (Psych Patients Only)  Admission Status Voluntary  Psychosocial Assessment  Patient Complaints Anxiety;Depression  Eye Contact Fair  Facial Expression Sad;Sullen  Affect Appropriate to circumstance;Sad;Sullen  Speech Logical/coherent  Interaction Forwards little  Motor Activity Other (Comment) (WDL)  Appearance/Hygiene Unremarkable  Behavior Characteristics Anxious  Mood Depressed;Labile  Aggressive Behavior  Targets Self  Type of Behavior Provoked or triggered  Effect Self-harm  Thought Process  Coherency WDL  Content Blaming self  Delusions None reported or observed  Perception WDL  Hallucination None reported or observed  Judgment Impaired  Confusion None  Danger to Self  Current suicidal ideation? Denies  Danger to Others  Danger to Others None reported or observed
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
## 2021-02-05 NOTE — Progress Notes (Signed)
Adult Psychoeducational Group Note  Date:  02/05/2021 Time:  4:49 PM  Group Topic/Focus:  Identifying Needs:   The focus of this group is to help patients identify their personal needs that have been historically problematic and identify healthy behaviors to address their needs.  Participation Level:  Active  Participation Quality:  Appropriate and Attentive  Affect:  Appropriate  Cognitive:  Alert and Appropriate  Insight: Appropriate and Good  Engagement in Group:  Engaged  Modes of Intervention:  Discussion  Additional Comments:  Pt attended and participated in the second group of the day.   Sharon Ward 02/05/2021, 4:49 PM
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
## 2021-02-05 NOTE — Discharge Instructions (Addendum)
Transfer to [facility] 

## 2021-02-05 NOTE — Progress Notes (Signed)
Pt signed 72 hour request for discharge form on 02/05/21 at 1250.  RN notified the team.
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
## 2021-02-05 NOTE — BH Assessment (Signed)
Per Hassie Bruce, RN, pt has been accepted to [facility] and should arrive prior to 0530.  Room: 304-1 Accepting: [name], NP Attending: Dr. Jola Babinski Call to Report: [phone]

## 2021-02-05 NOTE — ED Notes (Addendum)
Rudy at [facility] called and updated with lab results. safe transport called for transportation
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
## 2021-02-06 MED ORDER — NICOTINE 21 MG/24HR TD PT24
21.0000 mg | MEDICATED_PATCH | Freq: Every day | TRANSDERMAL | Status: DC
  Administered 2021-02-06 – 2021-02-10 (×5): 21 mg via TRANSDERMAL
  Filled 2021-02-06 (×6): qty 1

## 2021-02-06 NOTE — Progress Notes (Signed)
EKG results placed on the outside of pt's shadow chart  Sinus bradycardia Vent rate 59 BPM Otherwise normal ECG  QT/Qtc- 468/463 ms
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
## 2021-02-06 NOTE — Progress Notes (Signed)
Adult Psychoeducational Group Note  Date:  02/06/2021 Time:  12:50 AM  Group Topic/Focus:  Wrap-Up Group:   The focus of this group is to help patients review their daily goal of treatment and discuss progress on daily workbooks.  Participation Level:  Active  Participation Quality:  Appropriate and Attentive  Affect:  Appropriate  Cognitive:  Alert and Appropriate  Insight: Appropriate  Engagement in Group:  Engaged and Supportive  Modes of Intervention:  Discussion  Additional Comments: Pt articulated that her goal for today was to think more thoroughly, and this goal was accomplished. Pt stated she did talk with staff about her care. Pt conveyed she did not make a phone call to family or friends and reported relationship with her family and support system were the same. Pt reported she took all medications and attended all meal services with 100% intake. Pt said her appetite was good today. Pt evaluated her sleep last night as good. Pt verbalized she felt good about herself and rated her overall day a 5 out of 10 on this date. Pt articulated she had no physical pain, and Pt denies no auditory or visual hallucinations or thoughts of harming herself or others. Pt said she would alert staff if anything changed. End of Wrap-Up Group progress report.   Nicoletta Dress 02/06/2021, 12:50 AM
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
## 2021-02-06 NOTE — Progress Notes (Addendum)
   02/06/21 1100  Psych Admission Type (Psych Patients Only)  Admission Status Voluntary  Psychosocial Assessment  Patient Complaints None  Eye Contact Fair  Facial Expression Sad  Affect Appropriate to circumstance;Sad;Sullen  Speech Logical/coherent  Interaction Forwards little  Motor Activity Other (Comment) (WDL)  Appearance/Hygiene Unremarkable  Behavior Characteristics Cooperative;Calm  Mood Depressed  Aggressive Behavior  Targets Self  Type of Behavior Provoked or triggered  Effect Self-harm  Thought Process  Coherency WDL  Content WDL  Delusions None reported or observed  Perception WDL  Hallucination None reported or observed  Judgment Impaired  Confusion None  Danger to Self  Current suicidal ideation? Denies  Danger to Others  Danger to Others None reported or observed   D. Pt presents with a sad affect, anxious mood, friendly upon approach- pt has been visible in the dayroom interacting well with peers. Per pt's self inventory, pt rated her depression, hopelessness and anxiety all 0's. Pt reported that her goal today is "talking to my worker about a motel and the doc about my treatment". Pt currently denies SI/HI and AVH   A. Labs and vitals monitored. Pt given and educated on medications. Pt supported emotionally and encouraged to express concerns and ask questions.   R. Pt remains safe with 15 minute checks. Will continue POC.

## 2021-02-06 NOTE — Progress Notes (Signed)
Patient was cooperative with treatment on shift. She still endorses anxiety and depression on She was was compliant with medication and asked questions about medications that were prescribed.She denies SI although. She appears to be in bed resting quietly at this time.

## 2021-02-06 NOTE — Progress Notes (Signed)
Recreation Therapy Notes  Date:  7.1.22 Time: 0930 Location: [address] Dayroom  Group Topic: Stress Management  Goal Area(s) Addresses:  Patient will identify positive stress management techniques. Patient will identify benefits of using stress management post d/c.  Behavioral Response: Attentive  Intervention: Stress Management  Activity: Meditation.  LRT played a meditation that focused on setting boundaries and not being afraid to put self first sometimes even when it makes others uncomfortable.    Education:  Stress Management, Discharge Planning.   Education Outcome: Acknowledges Education  Clinical Observations/Feedback: Pt attended and participated.  Pt had no questions or concerns.    Damien Batty, LRT/CTRS         Berma Harts A 02/06/2021 11:13 AM 

## 2021-02-07 NOTE — Plan of Care (Signed)
  Problem: Activity: Goal: Sleeping patterns will improve Outcome: Progressing   Problem: Health Behavior/Discharge Planning: Goal: Compliance with therapeutic regimen will improve Outcome: Progressing   Problem: Coping: Goal: Coping ability will improve Outcome: Not Progressing
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
## 2021-02-07 NOTE — Progress Notes (Signed)
[facility] Group Notes:  (Nursing/MHT/Case Management/Adjunct)  Date:  02/07/2021  Time:  2015  Type of Therapy:   wrap up group  Participation Level:  Active  Participation Quality:  Appropriate, Attentive, Sharing, and Supportive  Affect:  Appropriate  Cognitive:  Alert  Insight:  Improving  Engagement in Group:  Engaged  Modes of Intervention:  Clarification, Education, and Support  Summary of Progress/Problems: Positive thinking and positive change were discussed.   Sharon Ward S 02/07/2021, 8:45 PM

## 2021-02-07 NOTE — Progress Notes (Signed)
      02/06/21 2141  Psych Admission Type (Psych Patients Only)  Admission Status Voluntary  Psychosocial Assessment  Patient Complaints Worrying  Eye Contact Fair  Facial Expression Sad  Affect Appropriate to circumstance;Sad;Sullen  Speech Logical/coherent  Interaction Forwards little  Motor Activity Other (Comment) (wdl)  Appearance/Hygiene Unremarkable  Behavior Characteristics Cooperative;Calm  Mood Depressed  Thought Process  Coherency WDL  Content WDL  Delusions None reported or observed  Perception WDL  Hallucination None reported or observed  Judgment Impaired  Confusion None  Danger to Self  Current suicidal ideation? Denies  Danger to Others  Danger to Others None reported or observed

## 2021-02-07 NOTE — Progress Notes (Signed)
Pt did not attend goals group today. 

## 2021-02-07 NOTE — [facility] Suicide Risk Assessment (Signed)
[facility] INPATIENT:  Family/Significant Other Suicide Prevention Education  Suicide Prevention Education:  Patient Refusal for Family/Significant Other Suicide Prevention Education: The patient Sharon Ward has refused to provide written consent for family/significant other to be provided Family/Significant Other Suicide Prevention Education during admission and/or prior to discharge.  Physician notified.  SPE completed with pt, as pt refused to consent to family contact. SPI pamphlet provided to pt and pt was encouraged to share information with support network, ask questions, and talk about any concerns relating to SPE. Pt denies access to guns/firearms and verbalized understanding of information provided. Mobile Crisis information also provided to pt.    Rona Ravens MSW, LCSW 02/07/2021, 11:56 AM
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
## 2021-02-07 NOTE — Progress Notes (Signed)
   02/07/21 1500  Psych Admission Type (Psych Patients Only)  Admission Status Voluntary  Psychosocial Assessment  Patient Complaints None  Eye Contact Fair  Facial Expression Sad  Affect Appropriate to circumstance;Sad;Sullen  Speech Logical/coherent  Interaction Assertive  Motor Activity Other (Comment) (wdl)  Appearance/Hygiene Unremarkable  Behavior Characteristics Cooperative;Appropriate to situation  Mood Depressed  Aggressive Behavior  Targets Self  Type of Behavior Provoked or triggered  Effect Self-harm  Thought Process  Coherency WDL  Content WDL  Delusions None reported or observed  Perception WDL  Hallucination None reported or observed  Judgment Impaired  Confusion None  Danger to Self  Current suicidal ideation? Denies  Danger to Others  Danger to Others None reported or observed

## 2021-02-08 NOTE — Progress Notes (Addendum)
PATIENT SIGNED 72 HR REQUEST FOR DISCHARGE ON   February 05, 2021   AT 12 50.  PATIENT WITHDREW REQUEST FOR DISCHARGE ON 02/08/2021 AT 1205.

## 2021-02-08 NOTE — Progress Notes (Signed)
Nurse discussed anxiety, depression and coping skills with patient.  
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
## 2021-02-08 NOTE — Progress Notes (Signed)
Adult Psychoeducational Group Note  Date:  02/08/2021 Time:  8:32 PM  Group Topic/Focus:  Wrap-Up Group:   The focus of this group is to help patients review their daily goal of treatment and discuss progress on daily workbooks.  Participation Level:  Active  Participation Quality:  Appropriate  Affect:  Appropriate  Cognitive:  Appropriate  Insight: Appropriate  Engagement in Group:  Engaged  Modes of Intervention:  Education  Additional Comments:  Patient attended and participated in group tonight. She reports that something that was significant about today was she learnt that she was leaving tomorrow.  Lita Mains [facility] 02/08/2021, 8:32 PM

## 2021-02-08 NOTE — [facility] Group Notes (Signed)
[facility] Group Notes: (Clinical Social Work)   02/08/2021      Type of Therapy:  Group Therapy   Participation Level:  Did Not Attend - was invited both individually by MHT and by overhead announcement, chose not to attend.   Ambrose Mantle, LCSW 02/08/2021, 3:37 PM

## 2021-02-08 NOTE — Progress Notes (Signed)
   02/07/21 2320  Psych Admission Type (Psych Patients Only)  Admission Status Voluntary  Psychosocial Assessment  Patient Complaints Insomnia  Eye Contact Fair  Facial Expression Sad  Affect Appropriate to circumstance;Sad;Sullen  Speech Logical/coherent  Interaction Assertive  Motor Activity Other (Comment) (wdl)  Appearance/Hygiene Unremarkable  Behavior Characteristics Cooperative  Mood Pleasant  Aggressive Behavior  Targets Self  Type of Behavior Provoked or triggered  Effect Self-harm  Thought Process  Coherency WDL  Content WDL  Delusions None reported or observed  Perception WDL  Hallucination None reported or observed  Judgment Impaired  Confusion None  Danger to Self  Current suicidal ideation? Denies  Danger to Others  Danger to Others None reported or observed

## 2021-02-08 NOTE — Plan of Care (Signed)
Nurse discussed coping skills with patient.  

## 2021-02-08 NOTE — Progress Notes (Signed)
   02/08/21 2302  Psych Admission Type (Psych Patients Only)  Admission Status Voluntary  Psychosocial Assessment  Patient Complaints Anxiety  Eye Contact Fair  Facial Expression Flat  Affect Appropriate to circumstance  Speech Logical/coherent  Interaction Assertive  Motor Activity Fidgety  Appearance/Hygiene Unremarkable  Behavior Characteristics Appropriate to situation  Mood Anxious  Thought Process  Coherency WDL  Content WDL  Delusions None reported or observed  Perception WDL  Hallucination None reported or observed  Judgment Impaired  Confusion None  Danger to Self  Current suicidal ideation? Denies  Danger to Others  Danger to Others None reported or observed

## 2021-02-08 NOTE — Progress Notes (Signed)
D:  Patient denied SI and HI, contracts for safety.  Denied A/V hallucinations.  Denied pain. A:  Medications administered per MD orders.  Emotional support and encouragement given patient. R:  Safety maintained with 15 minute checks.  
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
## 2021-02-09 NOTE — Progress Notes (Signed)
Nurse discussed anxiety, depression and coping skills with patient.  

## 2021-02-09 NOTE — Progress Notes (Signed)
   02/09/21 2234  Psych Admission Type (Psych Patients Only)  Admission Status Voluntary  Psychosocial Assessment  Patient Complaints None  Eye Contact Fair  Facial Expression Anxious  Affect Appropriate to circumstance  Speech Logical/coherent  Interaction Assertive  Motor Activity Other (Comment) (WDL)  Appearance/Hygiene Unremarkable  Behavior Characteristics Cooperative  Mood Anxious  Thought Process  Delusions None reported or observed  Perception WDL  Hallucination None reported or observed  Judgment Impaired  Confusion None  Danger to Self  Current suicidal ideation? Denies  Danger to Others  Danger to Others None reported or observed  D: Patient in dayroom interacting well with peers. Pt reports she had a good day.  A: Medications administered as prescribed. Support and encouragement provided as needed.  R: Patient remains safe on the unit. Will continue to monitor for safety and stability.

## 2021-02-09 NOTE — Progress Notes (Signed)
Recreation Therapy Notes  Date: 02/09/2021 Time: 930a  Topic: Leisure Participation   Goal Area(s) Addresses:  Patient will complete packet of holiday-theme worksheets, as a healthy free-time activity on unit.  Intervention: Independent Leisure   Education: Leisure Exposure- Word puzzles and [employer]   Comments: LRT provided pt a leisure packet in celebration of [place] Day. Pt given the option to complete the packet in the dayroom with [facility] staff and peers or at their own pace in their room.   Sharon Ward Sharon Ward, LRT/CTRS  Sharon Ward Sharon Ward 02/09/2021, 12:12 PM

## 2021-02-09 NOTE — Progress Notes (Signed)
D:  Patient's self inventory sheet, patient sleeps good, no sleep medication.  Good appetite, normal energy level, good concentration.  Rated depression 2, denied hopeless, anxiety 3.  Denied withdrawals.  Denied SI.  Denied physical problems.  Denied physical pain.  Goal is get discharge plans prepared.  Plans to talk to MD/SW and make phone calls.  "Thank you for kindness you have been since I've been here."  Does have discharge plans.   A:  Medications administered per MD order.  Emotional support and encouragement given patient. R:  Safety maintained with 15 minute checks.   Denied SI and HI.  Denied A/V hallucinations.

## 2021-02-09 NOTE — Progress Notes (Signed)
CSW spoke with patient about discharge. Patient reports that she would rather stay in [place] rather than [place] due to court date coming up on July 8th.  Patient has called several shelters in the [place] area and they are at capacity.   CSW also called shelters in [place]. They are currently at capacity but she CSW encouraged patient to call and get on waiting list.  CSW also called [facility] but they are currently not doing admissions for today.  Earliest she would be able to go is tomorrow, Tuesday, July 5th.    Patient also gave this social worker contact information for a natural support who is connected with her baby's father, Sherrill Raring, contact number is [phone].  Mr. Sherrill Raring reports that he can not help today due to the holiday and did not want to be contacted. Mr. Sherrill Raring said that patient can call him back tomorrow and he can look into her case.     CSW informed patient and provider about status of disposition.    Ah Bott, LCSW, LCAS Clincal Social Worker  [facility]

## 2021-02-09 NOTE — Progress Notes (Signed)
Adult Psychoeducational Group Note  Date:  02/09/2021 Time:  1:55 PM  Group Topic/Focus:  Orientation:   The focus of this group is to educate the patient on the purpose and policies of crisis stabilization and provide a format to answer questions about their admission.  The group details unit policies and expectations of patients while admitted.  Participation Level:  Active  Participation Quality:  Appropriate  Affect:  Appropriate  Cognitive:  Appropriate  Insight: Appropriate  Engagement in Group:  Engaged  Modes of Intervention:  Discussion  Additional Comments:  Patient said she will like to discuss her discharge and placement plan with the Child psychotherapist.   Sujey Gundry [place] 02/09/2021, 1:55 PM

## 2021-02-10 MED ORDER — MELATONIN 5 MG PO TABS: 5.0000 mg | ORAL_TABLET | Freq: Every day | ORAL | 0 refills | Status: AC

## 2021-02-10 MED ORDER — LATUDA 20 MG PO TABS: 60.0000 mg | ORAL_TABLET | Freq: Every day | ORAL | 0 refills | Status: DC

## 2021-02-10 MED ORDER — SERTRALINE HCL 50 MG PO TABS: 50.0000 mg | ORAL_TABLET | Freq: Every day | ORAL | 0 refills | Status: DC

## 2021-02-10 NOTE — Progress Notes (Signed)
Discharge Note:  Patient discharged home. Patient denied SI and HI.  Denied A/V hallucinations.  Suicide prevention information given and discussed with patient who stated she understood and had no questions.  Patient stated she received all her belongings, clothing, toiletries, misc items, etc.  Patient stated she appreciated all assistance received from [facility] staff.  All required discharge information given to patient.  
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
## 2021-02-10 NOTE — Progress Notes (Signed)
  [facility] Adult Case Management Discharge Plan :  Will you be returning to the same living situation after discharge:  No.  Patient will be going to [facility] for shelter resources At discharge, do you have transportation home?: No. Do you have the ability to pay for your medications: Yes,  Patient provided with good rx cards and education around affordable medications.  Release of information consent forms completed and in the chart;  Patient's signature needed at discharge.  Patient to Follow up at:  Follow-up Information     [facility] Follow up.   Specialty: Behavioral Health Why: You have a therapy intake appointment on 02/17/21 at 2:45pm. You have an appointment for medication management on 02/11/21 at 12pm. These appointments will be held in person. Contact information: [address] [place] [address] [phone]                 Next level of care provider has access to [facility] Link:no  Safety Planning and Suicide Prevention discussed: Yes,  provided to patient     Has patient been referred to the Quitline?: Patient refused referral  Patient has been referred for addiction treatment: N/A  Neno Hohensee E Jayshaun Phillips, LCSW 02/10/2021, 11:38 AM

## 2021-02-19 ENCOUNTER — Other Ambulatory Visit: Payer: Self-pay

## 2021-02-19 ENCOUNTER — Encounter (HOSPITAL_COMMUNITY): Payer: Self-pay

## 2021-02-19 ENCOUNTER — Emergency Department (HOSPITAL_COMMUNITY)
Admission: EM | Admit: 2021-02-19 | Discharge: 2021-02-20 | Disposition: A | Discharge status: Home / Self Care | Payer: Medicaid Other | Attending: Emergency Medicine | Admitting: Emergency Medicine

## 2021-02-19 ENCOUNTER — Emergency Department (HOSPITAL_COMMUNITY): Payer: Medicaid Other

## 2021-02-19 DIAGNOSIS — S0990XA Unspecified injury of head, initial encounter: Secondary | ICD-10-CM

## 2021-02-19 DIAGNOSIS — J45909 Unspecified asthma, uncomplicated: Secondary | ICD-10-CM | POA: Diagnosis not present

## 2021-02-19 DIAGNOSIS — S0083XA Contusion of other part of head, initial encounter: Secondary | ICD-10-CM | POA: Insufficient documentation

## 2021-02-19 DIAGNOSIS — S60811A Abrasion of right wrist, initial encounter: Secondary | ICD-10-CM | POA: Insufficient documentation

## 2021-02-19 DIAGNOSIS — F17210 Nicotine dependence, cigarettes, uncomplicated: Secondary | ICD-10-CM | POA: Diagnosis not present
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
## 2021-02-19 NOTE — ED Notes (Signed)
Pt transported to CT ?

## 2021-02-19 NOTE — Discharge Instructions (Addendum)
Head and neck CT's were normal. May be sore or have ongoing headache for a bit, tylenol or motrin for this when needed. Return here for new concerns.
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
## 2021-02-20 NOTE — ED Notes (Signed)
Called [facility] in [place] to arrange shelter for patient tonight. [employer] will transport

## 2021-04-01 ENCOUNTER — Ambulatory Visit (HOSPITAL_COMMUNITY)
Admission: EM | Admit: 2021-04-01 | Discharge: 2021-04-01 | Disposition: A | Discharge status: Other Acute Inpatient Hospital | Payer: Medicaid Other | Attending: Psychiatry | Admitting: Psychiatry

## 2021-04-01 ENCOUNTER — Encounter (HOSPITAL_COMMUNITY): Payer: Self-pay | Admitting: Emergency Medicine

## 2021-04-01 ENCOUNTER — Other Ambulatory Visit: Payer: Self-pay

## 2021-04-01 ENCOUNTER — Emergency Department (HOSPITAL_COMMUNITY)
Admission: EM | Admit: 2021-04-01 | Discharge: 2021-04-02 | Disposition: A | Discharge status: Other Acute Inpatient Hospital | Payer: Medicaid Other | Source: Home / Self Care | Attending: Emergency Medicine | Admitting: Emergency Medicine

## 2021-04-01 DIAGNOSIS — R45851 Suicidal ideations: Secondary | ICD-10-CM | POA: Insufficient documentation

## 2021-04-01 DIAGNOSIS — F419 Anxiety disorder, unspecified: Secondary | ICD-10-CM | POA: Diagnosis not present

## 2021-04-01 DIAGNOSIS — Z5900 Homelessness unspecified: Secondary | ICD-10-CM | POA: Insufficient documentation

## 2021-04-01 DIAGNOSIS — F17210 Nicotine dependence, cigarettes, uncomplicated: Secondary | ICD-10-CM | POA: Insufficient documentation

## 2021-04-01 DIAGNOSIS — Y900 Blood alcohol level of less than 20 mg/100 ml: Secondary | ICD-10-CM | POA: Insufficient documentation

## 2021-04-01 DIAGNOSIS — F3132 Bipolar disorder, current episode depressed, moderate: Secondary | ICD-10-CM

## 2021-04-01 DIAGNOSIS — Z20822 Contact with and (suspected) exposure to covid-19: Secondary | ICD-10-CM | POA: Diagnosis not present

## 2021-04-01 DIAGNOSIS — F314 Bipolar disorder, current episode depressed, severe, without psychotic features: Secondary | ICD-10-CM

## 2021-04-01 DIAGNOSIS — F32A Depression, unspecified: Secondary | ICD-10-CM | POA: Insufficient documentation

## 2021-04-01 DIAGNOSIS — X838XXA Intentional self-harm by other specified means, initial encounter: Secondary | ICD-10-CM

## 2021-04-01 DIAGNOSIS — F3130 Bipolar disorder, current episode depressed, mild or moderate severity, unspecified: Secondary | ICD-10-CM

## 2021-04-01 DIAGNOSIS — J45909 Unspecified asthma, uncomplicated: Secondary | ICD-10-CM | POA: Insufficient documentation

## 2021-04-01 LAB — CBC WITH DIFFERENTIAL/PLATELET
Abs Immature Granulocytes: 0.02 10*3/uL (ref 0.00–0.07)
Basophils Absolute: 0.1 10*3/uL (ref 0.0–0.1)
Basophils Relative: 1 %
Eosinophils Absolute: 0.1 10*3/uL (ref 0.0–0.5)
Eosinophils Relative: 1 %
HCT: 40.6 % (ref 36.0–46.0)
Hemoglobin: 13.6 g/dL (ref 12.0–15.0)
Immature Granulocytes: 0 %
Lymphocytes Relative: 32 %
Lymphs Abs: 2.5 10*3/uL (ref 0.7–4.0)
MCH: 31.5 pg (ref 26.0–34.0)
MCHC: 33.5 g/dL (ref 30.0–36.0)
MCV: 94 fL (ref 80.0–100.0)
Monocytes Absolute: 0.7 10*3/uL (ref 0.1–1.0)
Monocytes Relative: 9 %
Neutro Abs: 4.4 10*3/uL (ref 1.7–7.7)
Neutrophils Relative %: 57 %
Platelets: 239 10*3/uL (ref 150–400)
RBC: 4.32 MIL/uL (ref 3.87–5.11)
RDW: 12.7 % (ref 11.5–15.5)
WBC: 7.7 10*3/uL (ref 4.0–10.5)
nRBC: 0 % (ref 0.0–0.2)

## 2021-04-01 LAB — COMPREHENSIVE METABOLIC PANEL
ALT: 19 U/L (ref 0–44)
AST: 22 U/L (ref 15–41)
Albumin: 4.2 g/dL (ref 3.5–5.0)
Alkaline Phosphatase: 38 U/L (ref 38–126)
Anion gap: 9 (ref 5–15)
BUN: 13 mg/dL (ref 6–20)
CO2: 25 mmol/L (ref 22–32)
Calcium: 9.7 mg/dL (ref 8.9–10.3)
Chloride: 102 mmol/L (ref 98–111)
Creatinine, Ser: 0.87 mg/dL (ref 0.44–1.00)
GFR, Estimated: >60 mL/min (ref >60)
Glucose, Bld: 88 mg/dL (ref 70–99)
Potassium: 3.6 mmol/L (ref 3.5–5.1)
Sodium: 136 mmol/L (ref 135–145)
Total Bilirubin: 0.8 mg/dL (ref 0.3–1.2)
Total Protein: 7.1 g/dL (ref 6.5–8.1)

## 2021-04-01 LAB — URINALYSIS, ROUTINE W REFLEX MICROSCOPIC
Bilirubin Urine: NEGATIVE
Glucose, UA: NEGATIVE mg/dL
Hgb urine dipstick: NEGATIVE
Ketones, ur: NEGATIVE mg/dL
Nitrite: NEGATIVE
Protein, ur: NEGATIVE mg/dL
Specific Gravity, Urine: 1.029 (ref 1.005–1.030)
pH: 5 (ref 5.0–8.0)

## 2021-04-01 LAB — POCT URINE DRUG SCREEN - MANUAL ENTRY (I-SCREEN)
POC Amphetamine UR: NOT DETECTED
POC Buprenorphine (BUP): NOT DETECTED
POC Cocaine UR: NOT DETECTED
POC Marijuana UR: POSITIVE — AB
POC Methadone UR: NOT DETECTED
POC Methamphetamine UR: NOT DETECTED
POC Morphine: NOT DETECTED
POC Oxazepam (BZO): NOT DETECTED
POC Oxycodone UR: NOT DETECTED
POC Secobarbital (BAR): NOT DETECTED

## 2021-04-01 LAB — LIPID PANEL
Cholesterol: 195 mg/dL (ref 0–200)
HDL: 77 mg/dL (ref >40)
LDL Cholesterol: 103 mg/dL — ABNORMAL HIGH (ref 0–99)
Total CHOL/HDL Ratio: 2.5 RATIO
Triglycerides: 77 mg/dL (ref <150)
VLDL: 15 mg/dL (ref 0–40)

## 2021-04-01 LAB — POC SARS CORONAVIRUS 2 AG -  ED: SARS Coronavirus 2 Ag: NEGATIVE

## 2021-04-01 LAB — ETHANOL: Alcohol, Ethyl (B): <10 mg/dL (ref <10)

## 2021-04-01 LAB — HEMOGLOBIN A1C
Hgb A1c MFr Bld: 5.2 % (ref 4.8–5.6)
Mean Plasma Glucose: 102.54 mg/dL

## 2021-04-01 LAB — SALICYLATE LEVEL: Salicylate Lvl: <7 mg/dL — ABNORMAL LOW (ref 7.0–30.0)

## 2021-04-01 LAB — POCT PREGNANCY, URINE: Preg Test, Ur: NEGATIVE

## 2021-04-01 LAB — RESP PANEL BY RT-PCR (FLU A&B, COVID) ARPGX2
Influenza A by PCR: NEGATIVE
Influenza B by PCR: NEGATIVE
SARS Coronavirus 2 by RT PCR: NEGATIVE

## 2021-04-01 LAB — TSH: TSH: 9.971 u[IU]/mL — ABNORMAL HIGH (ref 0.350–4.500)

## 2021-04-01 LAB — PREGNANCY, URINE: Preg Test, Ur: NEGATIVE

## 2021-04-01 LAB — MAGNESIUM: Magnesium: 2.1 mg/dL (ref 1.7–2.4)

## 2021-04-01 MED ORDER — SERTRALINE HCL 50 MG PO TABS
50.0000 mg | ORAL_TABLET | Freq: Every day | ORAL | Status: DC
  Filled 2021-04-01: qty 1

## 2021-04-01 MED ORDER — ONDANSETRON HCL 4 MG PO TABS: 4.0000 mg | ORAL_TABLET | Freq: Three times a day (TID) | ORAL | Status: DC | PRN

## 2021-04-01 MED ORDER — ZOLPIDEM TARTRATE 5 MG PO TABS: 5.0000 mg | ORAL_TABLET | Freq: Every evening | ORAL | Status: DC | PRN

## 2021-04-01 MED ORDER — ACETAMINOPHEN 325 MG PO TABS: 650.0000 mg | ORAL_TABLET | ORAL | Status: DC | PRN

## 2021-04-01 MED ORDER — ALUM & MAG HYDROXIDE-SIMETH 200-200-20 MG/5ML PO SUSP: 30.0000 mL | ORAL | Status: DC | PRN

## 2021-04-01 MED ORDER — ALUM & MAG HYDROXIDE-SIMETH 200-200-20 MG/5ML PO SUSP: 30.0000 mL | Freq: Four times a day (QID) | ORAL | Status: DC | PRN

## 2021-04-01 MED ORDER — MAGNESIUM HYDROXIDE 400 MG/5ML PO SUSP: 30.0000 mL | Freq: Every day | ORAL | Status: DC | PRN

## 2021-04-01 MED ORDER — LURASIDONE HCL 20 MG PO TABS
60.0000 mg | ORAL_TABLET | Freq: Every day | ORAL | Status: DC
  Filled 2021-04-01 (×3): qty 3

## 2021-04-01 MED ORDER — NORGESTIMATE-ETH ESTRADIOL 0.25-35 MG-MCG PO TABS: 1.0000 | ORAL_TABLET | Freq: Every day | ORAL | Status: DC

## 2021-04-01 MED ORDER — NICOTINE 21 MG/24HR TD PT24
21.0000 mg | MEDICATED_PATCH | Freq: Every day | TRANSDERMAL | Status: DC
  Administered 2021-04-02: 21 mg via TRANSDERMAL
  Filled 2021-04-01: qty 1

## 2021-04-01 MED ORDER — ACETAMINOPHEN 325 MG PO TABS: 650.0000 mg | ORAL_TABLET | Freq: Four times a day (QID) | ORAL | Status: DC | PRN

## 2021-04-01 MED ORDER — NORGESTIMATE-ETH ESTRADIOL 0.25-35 MG-MCG PO TABS
1.0000 | ORAL_TABLET | Freq: Every day | ORAL | Status: DC
  Administered 2021-04-02: 1 via ORAL
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
## 2021-04-01 NOTE — ED Notes (Signed)
In triage 

## 2021-04-01 NOTE — ED Notes (Signed)
Report given to [facility]   charge nurse

## 2021-04-01 NOTE — ED Notes (Signed)
Called [facility] for stat pick up 
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
## 2021-04-01 NOTE — ED Triage Notes (Signed)
Pt sent here voluntarily from [facility] for suicide attempt. Pt reports feeling depressed and having suicidal thoughts, today attempted to jump in front of a car and started cutting herself with glass.

## 2021-04-01 NOTE — Progress Notes (Signed)
Pt meets inpatient behavioral health placement per Park Pope, MD. CSW was informed that there are no appropriate beds at [facility] per [facility]; CSW referred out.   Destination Service Provider Address Phone Fax  [facility]  [address] [place] [address] [phone]  [facility]  [address] [place], [place] [address] [phone]  [facility]  [address] [place] [address] [phone]  [facility]  [address] [place] [address] [phone]  [facility]  [address] [place] [address] [phone]  [facility]  [address] [place] [address] [phone]  [facility]  [address] [place] [address] [phone]  [facility]  [address] [place] [address] [phone]  [facility]  [address] [place], [place] [address] [phone]  [facility]  [address] [place] [address] [phone]  [facility]  [address] [place] [address] [phone]  Maryjean Ka, MSW, [facility] 04/01/2021 9:26 PM

## 2021-04-01 NOTE — ED Provider Notes (Signed)
[facility] EMERGENCY DEPARTMENT Provider Note   CSN: [id number] Arrival date & time: 04/01/21  2034     History Chief Complaint  Patient presents with   Suicidal    Sharon Ward is a 25 y.o. female.  The history is provided by the patient and medical records. No language interpreter was used.   25 year old female significant history of bipolar, depression, general anxiety who presents with report of suicidal ideation.  Patient states that she was walking with her boyfriend today and they were having an argument.  She proceeds to jump out in front of traffic and her boyfriend has to pull her back.  Afterward she went home and thought about cutting her arms but decided to reach out to behavioral health to seek help for suicidal ideation.  She reported feeling depressed and upset about her relationship.  She also report recent psychiatric medication change approximately 3 weeks ago.  She denies she denies having any homicidal ideation denies auditory or visual hallucination.  She denies self-medicating with drugs or alcohol.  She has had prior suicidal attempts with drug overdose.  She is eating and sleeping less.  She is here voluntarily and was sent from [facility] for medical screening.  Past Medical History:  Diagnosis Date   Asthma    Bipolar 1 disorder, mixed (HCC)    Depression    Generalized anxiety disorder    Relationship dysfunction     Patient Active Problem List   Diagnosis Date Noted   Bipolar 2 disorder, major depressive episode (HCC) 02/05/2021   Intentional drug overdose (HCC) 11/03/2020   Suicide attempt (HCC) 11/02/2020   Prolonged QT interval 11/02/2020   Hypokalemia 11/02/2020   Major depressive disorder, 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   Bipolar I disorder, most recent episode depressed (HCC) 10/25/2020   Left foot pain 10/04/2020   Obesity affecting pregnancy 09/28/2019   Anxiety 09/18/2019   Asthma 09/18/2019    Bipolar disorder (HCC) 09/18/2019   Depression 09/18/2019   LGSIL on Pap smear of cervix 09/18/2019   Oppositional defiant disorder 09/18/2019   H/O psychiatric hospitalization 09/18/2019   Seasonal allergies 09/18/2019   Suicidal ideation 09/18/2019   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    Past Surgical History:  Procedure Laterality Date   PILONIDAL CYST / SINUS EXCISION  09/11/2013   PILONIDAL CYST EXCISION  05/17/2014   Pilonidal cystectomy with cleft lip     OB History     Gravida  3   Para  1   Term  1   Preterm      AB      Living  1      SAB      IAB      Ectopic      Multiple  0   Live Births  1           Family History  Problem Relation Age of Onset   Healthy Mother    Healthy Father     Social History   Tobacco Use   Smoking status: Every Day    Packs/day: 0.25    Types: Cigarettes   Smokeless tobacco: Never   Tobacco comments:    only smoke a "couple" of cigarettes when stressed or anxious, socially with friends per [facility] chart  Vaping Use   Vaping Use: Never used  Substance Use Topics   Alcohol use: Not Currently    Comment: occasional prior to pregnancy   Drug use: Not Currently  Types: Marijuana    Comment: reports use 4 or 5 times; last used 05/04/19    Home Medications Prior to Admission medications   Medication Sig Start Date End Date Taking? Authorizing Provider  ferrous sulfate 325 (65 FE) MG EC tablet Take 325 mg by mouth daily.    [provider]  lurasidone (LATUDA) 20 MG TABS tablet Take 3 tablets (60 mg total) by mouth daily. 02/11/21 03/13/21  Laveda Abbe, NP  Prenatal Vit-Fe Fumarate-FA (MULTIVITAMIN-PRENATAL) 27-0.8 MG TABS tablet Take 1 tablet by mouth daily at 12 noon.    [provider]  sertraline (ZOLOFT) 50 MG tablet Take 1 tablet (50 mg total) by mouth daily. 02/11/21   Laveda Abbe, NP  SPRINTEC 28 0.25-35 MG-MCG tablet Take 1 tablet by mouth daily. 01/15/21   Gita Kudo, MD     Allergies    Ascorbate, Citrus, Coconut flavor, Lamotrigine, Orange (diagnostic), Peach flavor, Pear, and Pineapple  Review of Systems   Review of Systems  All other systems reviewed and are negative.  Physical Exam Updated Vital Signs BP 127/81   Pulse (!) 58   Temp 98 F (36.7 C)   Resp 16   Ht 5\' 3"  (1.6 m)   Wt 80 kg   LMP 03/06/2021 (Exact Date)   SpO2 100%   BMI 31.24 kg/m   Physical Exam Vitals and nursing note reviewed.  Constitutional:      General: She is not in acute distress.    Appearance: She is well-developed.  HENT:     Head: Atraumatic.  Eyes:     Conjunctiva/sclera: Conjunctivae normal.  Cardiovascular:     Rate and Rhythm: Normal rate and regular rhythm.     Pulses: Normal pulses.     Heart sounds: Normal heart sounds.  Pulmonary:     Effort: Pulmonary effort is normal.  Abdominal:     Palpations: Abdomen is soft.     Tenderness: There is no abdominal tenderness.  Musculoskeletal:     Cervical back: Neck supple.  Skin:    Findings: No rash.  Neurological:     Mental Status: She is alert.  Psychiatric:        Mood and Affect: Mood normal.        Speech: Speech normal.        Behavior: Behavior is cooperative.        Thought Content: Thought content is not paranoid. Thought content includes suicidal ideation. Thought content does not include homicidal ideation.    ED Results / Procedures / Treatments   Labs (all labs ordered are listed, but only abnormal results are displayed) Labs Reviewed  RESP PANEL BY RT-PCR (FLU A&B, COVID) [id number]  COMPREHENSIVE METABOLIC PANEL  ETHANOL  SALICYLATE LEVEL  CBC WITH DIFFERENTIAL/PLATELET  RAPID URINE DRUG SCREEN, HOSP PERFORMED  POC URINE PREG, ED    EKG None  Radiology No results found.  Procedures Procedures   Medications Ordered in ED Medications - No data to display  ED Course  I have reviewed the triage vital signs and the nursing notes.  Pertinent labs & imaging results  that were available during my care of the patient were reviewed by me and considered in my medical decision making (see chart for details).    MDM Rules/Calculators/A&P                           BP 127/81   Pulse (!) 58  Temp 98 F (36.7 C)   Resp 16   Ht 5\' 3"  (1.6 m)   Wt 80 kg   LMP 03/06/2021 (Exact Date)   SpO2 100%   BMI 31.24 kg/m   Final Clinical Impression(s) / ED Diagnoses Final diagnoses:  Suicidal ideation    Rx / DC Orders ED Discharge Orders     None      Patient sent here from [facility] for suicidal ideation with suicidal attempt.  She is medically cleared and can be assessed further by TTS and psychiatry.  She is here voluntarily.  She is calm and cooperative.   [facility], PA-C 04/01/21 2131    2132, MD 04/01/21 [phone]

## 2021-04-01 NOTE — BHH Counselor (Signed)
TTS triage: Patient presents to [facility] unaccompanied reporting she is feeling "very suicidal" after a visitation today with her 23 year old daughter who is in foster care. She states she is also struggling with homelessness and felt like when she spoke to the caseworker about it today she did not care. She states earlier today she tried to jump in front of a car and her boyfriend stopped her. She reports current plan to overdose. She denies HI/AVH or any substance use in the past 24 hours. She states she is supposed to have court tomorrow but called her lawyer and told them she was "committing herself."  Patient is emergent

## 2021-04-02 ENCOUNTER — Inpatient Hospital Stay (HOSPITAL_COMMUNITY)
Admission: AD | Admit: 2021-04-02 | Discharge: 2021-04-05 | DRG: 885 | Disposition: A | Discharge status: Home / Self Care | Payer: Medicaid Other | Source: Intra-hospital | Attending: Psychiatry | Admitting: Psychiatry

## 2021-04-02 DIAGNOSIS — F3130 Bipolar disorder, current episode depressed, mild or moderate severity, unspecified: Principal | ICD-10-CM | POA: Diagnosis present

## 2021-04-02 DIAGNOSIS — R45851 Suicidal ideations: Secondary | ICD-10-CM | POA: Diagnosis not present

## 2021-04-02 DIAGNOSIS — Z91018 Allergy to other foods: Secondary | ICD-10-CM | POA: Diagnosis not present

## 2021-04-02 DIAGNOSIS — F319 Bipolar disorder, unspecified: Secondary | ICD-10-CM | POA: Diagnosis present

## 2021-04-02 DIAGNOSIS — F411 Generalized anxiety disorder: Secondary | ICD-10-CM | POA: Diagnosis present

## 2021-04-02 DIAGNOSIS — F419 Anxiety disorder, unspecified: Secondary | ICD-10-CM | POA: Diagnosis present

## 2021-04-02 DIAGNOSIS — Z9151 Personal history of suicidal behavior: Secondary | ICD-10-CM | POA: Diagnosis not present

## 2021-04-02 DIAGNOSIS — Z888 Allergy status to other drugs, medicaments and biological substances status: Secondary | ICD-10-CM

## 2021-04-02 DIAGNOSIS — G4710 Hypersomnia, unspecified: Secondary | ICD-10-CM | POA: Diagnosis present

## 2021-04-02 DIAGNOSIS — F17210 Nicotine dependence, cigarettes, uncomplicated: Secondary | ICD-10-CM | POA: Diagnosis present

## 2021-04-02 DIAGNOSIS — Z30432 Encounter for removal of intrauterine contraceptive device: Secondary | ICD-10-CM

## 2021-04-02 LAB — T4, FREE: Free T4: 0.71 ng/dL (ref 0.61–1.12)

## 2021-04-02 MED ORDER — SERTRALINE HCL 50 MG PO TABS
50.0000 mg | ORAL_TABLET | Freq: Every day | ORAL | Status: DC
  Filled 2021-04-02: qty 1

## 2021-04-02 MED ORDER — SERTRALINE HCL 50 MG PO TABS: 50.0000 mg | ORAL_TABLET | Freq: Every day | ORAL | Status: DC

## 2021-04-02 MED ORDER — HYDROXYZINE HCL 25 MG PO TABS
25.0000 mg | ORAL_TABLET | Freq: Three times a day (TID) | ORAL | Status: DC | PRN
  Administered 2021-04-02: 25 mg via ORAL
  Filled 2021-04-02 (×2): qty 1

## 2021-04-02 MED ORDER — ALUM & MAG HYDROXIDE-SIMETH 200-200-20 MG/5ML PO SUSP
30.0000 mL | ORAL | Status: DC | PRN
Start: 2021-04-02 — End: 2021-04-06

## 2021-04-02 MED ORDER — PRENATAL 27-0.8 MG PO TABS: 1.0000 | ORAL_TABLET | Freq: Every day | ORAL | Status: DC

## 2021-04-02 MED ORDER — NORGESTIMATE-ETH ESTRADIOL 0.25-35 MG-MCG PO TABS: 1.0000 | ORAL_TABLET | Freq: Every day | ORAL | Status: DC

## 2021-04-02 MED ORDER — LURASIDONE HCL 20 MG PO TABS: 60.0000 mg | ORAL_TABLET | Freq: Every day | ORAL | Status: DC

## 2021-04-02 MED ORDER — FERROUS SULFATE 325 (65 FE) MG PO TABS: 325.0000 mg | ORAL_TABLET | Freq: Every day | ORAL | Status: DC

## 2021-04-02 MED ORDER — MAGNESIUM HYDROXIDE 400 MG/5ML PO SUSP: 30.0000 mL | Freq: Every day | ORAL | Status: DC | PRN

## 2021-04-02 MED ORDER — TRAZODONE HCL 50 MG PO TABS: 50.0000 mg | ORAL_TABLET | Freq: Every evening | ORAL | Status: DC | PRN

## 2021-04-02 MED ORDER — ACETAMINOPHEN 325 MG PO TABS: 650.0000 mg | ORAL_TABLET | Freq: Four times a day (QID) | ORAL | Status: DC | PRN

## 2021-04-02 NOTE — ED Notes (Signed)
Pt belongings secured in locker #3

## 2021-04-02 NOTE — ED Notes (Signed)
Pt states she takes her Latuda and Zoloft at night time as it makes her sleepy. Will notify pharmacist to change schedule.

## 2021-04-02 NOTE — Progress Notes (Signed)
Pt accepted to [facility] 307-1    Patient meets inpatient criteria per Park Pope, MD   Dr.Kumar is the attending provider.    Call report to [phone]    Ma Katrina Chana Bode, RN @ [facility] ED notified.     Pt scheduled  to arrive at [facility] today by 1300.   Damita Dunnings, MSW, LCSW-A  10:45 AM 04/02/2021

## 2021-04-02 NOTE — ED Notes (Signed)
Pt big black bag x 1 given back to pt. Pt home meds locked in pharmacy given back to pt. Pt signed return of meds and belongings. Pt checked her belongings and made sure she has her wallet, cellphone and everything. No missed belongings at this time.

## 2021-04-02 NOTE — ED Notes (Signed)
Call placed to main pharm-advised note in chart reports home meds were sent to pharm but there is no inventory form in chart; pharmacy confirms meds are there and reports the chart will flag at discharge so pt meds are sent with pt

## 2021-04-02 NOTE — Progress Notes (Signed)
Admission Note: Patient is a 25 year old female who is admitted to the unit from [facility] for symptoms of depression and suicidal ideation with no plans.  Patient is alert and oriented x 4.  Presents with a flat affect and depressed mood.  Stated she's here to work on trying to get better and to have positive things to do.  Admission plan of care reviewed and consent for treatment signed.  Skin assessment and personal belongings completed.  Skin is dry and intact.  No contraband found.  Patient oriented to the unit, staff and room.  Routine safety checks initiated.  Verbalizes understanding of unit rules/protocols.  Offered support and encouragement as needed.  Patient is safe on the unit at this time.

## 2021-04-02 NOTE — ED Notes (Signed)
Pt is now awake and eating breakfast. Pt is cooperative with unit rules. Asked to get the number of her lawyer as she is supposed to be in court today. Pt was given permission to make a phone call after she eats to lawyer to inform lawyer that she is currently in ED. Pt is cooperative and calm at this time. High risk for suicide. Sitter order in and is requested as pt attempted to cut herself yesterday with a glass as per note. Will continue to monitor. Safety precautions maintained.

## 2021-04-02 NOTE — Progress Notes (Signed)
Patient information has been sent to [facility] via secure chat to review for potential admission. Patient meets inpatient criteria per Park Pope, MD.   Situation ongoing, CSW will continue to monitor progress.    Signed:  Damita Dunnings, MSW, LCSW-A  04/02/2021 9:41 AM

## 2021-04-02 NOTE — ED Notes (Signed)
Pt is going to [facility] room 307-1. Accepting MD is Nelly Rout. Nurse report given to [facility].

## 2021-04-02 NOTE — ED Notes (Signed)
Pt will be going by [employer].

## 2021-04-02 NOTE — ED Provider Notes (Addendum)
Emergency Medicine Observation Re-evaluation Note  Sharon Ward is a 25 y.o. female, seen on rounds today.  Pt initially presented to the ED for complaints of Suicidal Currently, the patient is resting.  Physical Exam  BP 110/63 (BP Location: Left Arm)   Pulse 69   Temp 98 F (36.7 C)   Resp 16   Ht 5\' 3"  (1.6 m)   Wt 80 kg   LMP 03/06/2021 (Exact Date)   SpO2 100%   BMI 31.24 kg/m  Physical Exam General: Calm, cooperative Cardiac: Warm and well-perfused Lungs: Even and unlabored Psych: Calm  ED Course / MDM  EKG:   I have reviewed the labs performed to date as well as medications administered while in observation.  Recent changes in the last 24 hours include psychiatry recommending inpatient placement, awaiting placement.  Plan  Current plan is for inpatient psych placement Disposition per psychiatry  Sharon Ward is not under involuntary commitment.     [name], MD 04/02/21 [phone]    10:45 AM addendum -received request from psych NP to review her TSH level.  Patient had a TSH level that was elevated to 9.  She has previously had elevated TSH in the past but has previously had normal free T4 levels.  Patient's vital signs are stable.  I would recommend sending free T4 level now and having patient follow-up with primary doctor to discuss her levels.  For now, would not recommend empiric treatment.   [id number], MD 04/02/21 1046

## 2021-04-02 NOTE — ED Notes (Signed)
Pt assisted to shower in purple zone and linen changed on pt's bed.

## 2021-04-02 NOTE — ED Notes (Signed)
Pt's medication locked up in pharmacy. Pt's birth control with personal belongings so patient can take as prescribed. Pharmacist placed in labeled bag.

## 2021-04-02 NOTE — ED Notes (Signed)
Safe transport is here to pick up pt. Pt was escorted to ED lobby where pt was handed to safe transport staff.

## 2021-04-02 NOTE — Progress Notes (Signed)
BHH Group Notes:  (Nursing/MHT/Case Management/Adjunct)  Date:  04/02/2021  Time:  2000  Type of Therapy:   wrap up group  Participation Level:  Active  Participation Quality:  Appropriate, Attentive, Sharing, and Supportive  Affect:  Appropriate  Cognitive:  Alert  Insight:  Improving  Engagement in Group:  Engaged  Modes of Intervention:  Clarification, Education, and Support   Summary of Progress/Problems: Positive thinking and positive change were discussed.   Marcille Buffy 04/02/2021, 8:48 PM

## 2021-04-02 NOTE — ED Notes (Signed)
Patient was given a [place] sandwich bag with graham crackers, cheese added in bag w/ cup of coke.

## 2021-04-02 NOTE — ED Notes (Signed)
Pt wanded by security. 

## 2021-04-02 NOTE — Progress Notes (Signed)
Adult Psychoeducational Group Note  Date:  04/02/2021 Time:  5:44 PM  Group Topic/Focus:  Coping With Mental Health Crisis:   The purpose of this group is to help patients identify strategies for coping with mental health crisis.  Group discusses possible causes of crisis and ways to manage them effectively.  Participation Level:  Active  Participation Quality:  Appropriate  Affect:  Appropriate  Cognitive:  Appropriate  Insight: Appropriate  Engagement in Group:  Engaged  Modes of Intervention:  Discussion  Additional Comments:  Pt attended group and participated in discussion.  Nilza Eaker R Deona Novitski 04/02/2021, 5:44 PM

## 2021-04-03 LAB — T3: T3, Total: 154 ng/dL (ref 71–180)

## 2021-04-03 MED ORDER — FERROUS SULFATE 325 (65 FE) MG PO TABS
325.0000 mg | ORAL_TABLET | Freq: Every day | ORAL | Status: DC
  Administered 2021-04-03 – 2021-04-04 (×3): 325 mg via ORAL
  Filled 2021-04-03 (×7): qty 1

## 2021-04-03 MED ORDER — LURASIDONE HCL 20 MG PO TABS
60.0000 mg | ORAL_TABLET | Freq: Every day | ORAL | Status: DC
  Administered 2021-04-03 (×2): 60 mg via ORAL
  Filled 2021-04-03 (×4): qty 3

## 2021-04-03 MED ORDER — SERTRALINE HCL 100 MG PO TABS
100.0000 mg | ORAL_TABLET | Freq: Every day | ORAL | Status: DC
  Administered 2021-04-03 – 2021-04-04 (×2): 100 mg via ORAL
  Filled 2021-04-03 (×6): qty 1

## 2021-04-03 MED ORDER — PRENATAL 27-0.8 MG PO TABS
1.0000 | ORAL_TABLET | Freq: Every day | ORAL | Status: DC
  Administered 2021-04-04: 1 via ORAL
  Filled 2021-04-03 (×8): qty 1

## 2021-04-03 MED ORDER — NORGESTIMATE-ETH ESTRADIOL 0.25-35 MG-MCG PO TABS
1.0000 | ORAL_TABLET | Freq: Every day | ORAL | Status: DC
  Administered 2021-04-03 – 2021-04-04 (×2): 1 via ORAL

## 2021-04-03 MED ORDER — NORGESTIMATE-ETH ESTRADIOL 0.25-35 MG-MCG PO TABS
1.0000 | ORAL_TABLET | Freq: Every day | ORAL | Status: DC
Start: 2021-04-03 — End: 2021-04-03

## 2021-04-03 MED ORDER — SERTRALINE HCL 50 MG PO TABS
50.0000 mg | ORAL_TABLET | Freq: Every day | ORAL | Status: DC
Start: 2021-04-03 — End: 2021-04-03
  Administered 2021-04-03: 50 mg via ORAL
  Filled 2021-04-03 (×4): qty 1

## 2021-04-03 NOTE — Progress Notes (Addendum)
Patient states that her home birth control medication of Sprintec 29 is with her belongings. With patient's consent, myself, RN, and security obtained patient's home birth control medication of Sprintec 28 from patient's belongings in patient's locker. I personally verified patient's home Sprintec 28 medication with [facility] via phone. Per Pharmacy, patient is able to take her provided home Sprintec 28 medication while at [facility], and it is okay for patient's home Sprintec 28 medication to be ordered as formulary alternative/equivalent of Ortho-Cyclen 0.25-35 mg-mcg in the epic system although patient will be taking her provided home Sprintec 28 medication here at [facility]. Patient's birth control medication to be stored in the adult unit med room and patient is due for her next dose on 04/03/21 at bedtime.

## 2021-04-03 NOTE — Progress Notes (Signed)
Nutrition Brief Note  Patient identified on the Malnutrition Screening Tool (MST) Report  No weight loss noted in weight records.  Wt Readings from Last 15 Encounters:  04/02/21 81.2 kg  04/01/21 80 kg  02/19/21 79.4 kg  02/05/21 78.9 kg  01/15/21 78.9 kg  12/03/20 83.7 kg  11/02/20 79.4 kg  10/25/20 82.6 kg  10/24/20 79.4 kg  10/03/20 79.4 kg  09/15/20 81.8 kg  09/11/20 82.1 kg  08/27/20 83.3 kg  08/11/20 84 kg  06/14/20 83 kg    Body mass index is 31.71 kg/m. Patient meets criteria for obesity based on current BMI.   Current diet order is regular. Labs and medications reviewed.   No nutrition interventions warranted at this time. If nutrition issues arise, please consult RD.   Sharon Franco, MS, RD, LDN Inpatient Clinical Dietitian Contact information available via Amion

## 2021-04-03 NOTE — Tx Team (Signed)
Interdisciplinary Treatment and Diagnostic Plan Update  04/03/2021 Time of Session: 2pm Sharon Ward MRN: [id number]  Principal Diagnosis: Bipolar depression ([place])  Secondary Diagnoses: Principal Problem:   Bipolar depression ([place]) Active Problems:   Anxiety   Current Medications:  Current Facility-Administered Medications  Medication Dose Route Frequency Provider Last Rate Last Admin   acetaminophen (TYLENOL) tablet 650 mg  650 mg Oral Q6H PRN White, Patrice L, NP       alum & mag hydroxide-simeth (MAALOX/MYLANTA) 200-200-20 MG/5ML suspension 30 mL  30 mL Oral Q4H PRN White, Patrice L, NP       ferrous sulfate tablet 325 mg  325 mg Oral QHS Margorie John W, PA-C   325 mg at 04/03/21 0025   hydrOXYzine (ATARAX/VISTARIL) tablet 25 mg  25 mg Oral TID PRN Darrol Angel L, NP   25 mg at 04/02/21 2114   lurasidone (LATUDA) tablet 60 mg  60 mg Oral QHS Margorie John W, PA-C   60 mg at 04/03/21 0026   magnesium hydroxide (MILK OF MAGNESIA) suspension 30 mL  30 mL Oral Daily PRN Marissa Calamity, NP       multivitamin-prenatal tablet 1 tablet  1 tablet Oral QHS Margorie John W, PA-C       norgestimate-ethinyl estradiol (ORTHO-CYCLEN) 0.25-35 MG-MCG tablet 1 tablet  1 tablet Oral QHS Margorie John W, PA-C       sertraline (ZOLOFT) tablet 50 mg  50 mg Oral QHS Margorie John W, PA-C   50 mg at 04/03/21 0026   traZODone (DESYREL) tablet 50 mg  50 mg Oral QHS PRN Prescilla Sours, PA-C       PTA Medications: Medications Prior to Admission  Medication Sig Dispense Refill Last Dose   ferrous sulfate 325 (65 FE) MG EC tablet Take 325 mg by mouth at bedtime.      lurasidone (LATUDA) 20 MG TABS tablet Take 3 tablets (60 mg total) by mouth daily. (Patient taking differently: Take 60 mg by mouth at bedtime.) 90 tablet 0    Prenatal Vit-Fe Fumarate-FA (MULTIVITAMIN-PRENATAL) 27-0.8 MG TABS tablet Take 1 tablet by mouth at bedtime.      sertraline (ZOLOFT) 50 MG tablet Take 1 tablet (50 mg total) by mouth  daily. (Patient taking differently: Take 50 mg by mouth at bedtime.) 30 tablet 0    SPRINTEC 28 0.25-35 MG-MCG tablet Take 1 tablet by mouth daily. (Patient taking differently: Take 1 tablet by mouth at bedtime.) 28 tablet 11     Patient Stressors:    Patient Strengths:    Treatment Modalities: Medication Management, Group therapy, Case management,  1 to 1 session with clinician, Psychoeducation, Recreational therapy.   Physician Treatment Plan for Primary Diagnosis: Bipolar depression ([place]) Long Term Goal(s):     Short Term Goals:    Medication Management: Evaluate patient's response, side effects, and tolerance of medication regimen.  Therapeutic Interventions: 1 to 1 sessions, Unit Group sessions and Medication administration.  Evaluation of Outcomes: Not Met  Physician Treatment Plan for Secondary Diagnosis: Principal Problem:   Bipolar depression ([place]) Active Problems:   Anxiety  Long Term Goal(s):     Short Term Goals:       Medication Management: Evaluate patient's response, side effects, and tolerance of medication regimen.  Therapeutic Interventions: 1 to 1 sessions, Unit Group sessions and Medication administration.  Evaluation of Outcomes: Not Met   RN Treatment Plan for Primary Diagnosis: Bipolar depression ([place]) Long Term Goal(s): Knowledge of disease and therapeutic regimen to  maintain health will improve  Short Term Goals: Ability to verbalize frustration and anger appropriately will improve, Ability to participate in decision making will improve, and Ability to verbalize feelings will improve  Medication Management: RN will administer medications as ordered by provider, will assess and evaluate patient's response and provide education to patient for prescribed medication. RN will report any adverse and/or side effects to prescribing provider.  Therapeutic Interventions: 1 on 1 counseling sessions, Psychoeducation, Medication administration, Evaluate  responses to treatment, Monitor vital signs and CBGs as ordered, Perform/monitor CIWA, COWS, AIMS and Fall Risk screenings as ordered, Perform wound care treatments as ordered.  Evaluation of Outcomes: Not Met   LCSW Treatment Plan for Primary Diagnosis: Bipolar depression ([place]) Long Term Goal(s): Safe transition to appropriate next level of care at discharge, Engage patient in therapeutic group addressing interpersonal concerns.  Short Term Goals: Engage patient in aftercare planning with referrals and resources, Increase social support, and Increase ability to appropriately verbalize feelings  Therapeutic Interventions: Assess for all discharge needs, 1 to 1 time with Social worker, Explore available resources and support systems, Assess for adequacy in community support network, Educate family and significant other(s) on suicide prevention, Complete Psychosocial Assessment, Interpersonal group therapy.  Evaluation of Outcomes: Not Met   Progress in Treatment: Attending groups: No. Participating in groups: No. Taking medication as prescribed: Yes. Toleration medication: Yes. Family/Significant other contact made: No, will contact:  will obtain consent  Patient understands diagnosis: No. Discussing patient identified problems/goals with staff: No. Medical problems stabilized or resolved: Yes. Denies suicidal/homicidal ideation: Yes. Issues/concerns per patient self-inventory: No. Other: None  New problem(s) identified: No, Describe:  None  New Short Term/Long Term Goal(s):medication stabilization, elimination of SI thoughts, development of comprehensive mental wellness plan.   Patient Goals:  Pt was meeting with the MD during this time.   Discharge Plan or Barriers: Patient recently admitted. CSW will continue to follow and assess for appropriate referrals and possible discharge planning.   Reason for Continuation of Hospitalization: Depression Medication stabilization Suicidal  ideation  Estimated Length of Stay: 3-5 days  Attendees: Patient: 04/03/2021 2:25 PM  Physician: Dr. Kai Levins 04/03/2021 2:25 PM  Nursing:  04/03/2021 2:25 PM  RN Care Manager: 04/03/2021 2:25 PM  Social Worker: Toney Reil, [place] 04/03/2021 2:25 PM  Recreational Therapist:  04/03/2021 2:25 PM  Other:  04/03/2021 2:25 PM  Other:  04/03/2021 2:25 PM  Other: 04/03/2021 2:25 PM    Scribe for Treatment Team: Mliss Fritz, [place] 04/03/2021 2:25 PM
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
## 2021-04-03 NOTE — Plan of Care (Signed)
  Problem: Education: Goal: Emotional status will improve Outcome: Not Progressing Goal: Mental status will improve Outcome: Not Progressing   Problem: Coping: Goal: Ability to demonstrate self-control will improve Outcome: Not Progressing   

## 2021-04-03 NOTE — [facility] Group Notes (Signed)
LCSW Aftercare Discharge Planning Group Note  Type of Group and Topic: Psychoeducational Group: Discharge Planning  Participation Level: Did Not Attend  Description of Group: Discharge planning group reviews patient's anticipated discharge plans and assists patients to anticipate and address any barriers to wellness/recovery in the community. Suicide prevention education is reviewed with patients in group.  Therapeutic Goals 1. Patients will state their anticipated discharge plan and mental health aftercare 2. Patients will identify potential barriers to wellness in the community setting 3. Patients will engage in problem solving, solution focused discussion of ways to anticipate and address barriers to wellness/recovery    Therapeutic Modalities: Motivational Interviewing  Felizardo Hoffmann, Theresia Majors  04/03/2021 2:11 PM

## 2021-04-03 NOTE — Progress Notes (Signed)
  D:  Pt presents with anxiety and depression on assessment.  Pt reports concern for her daughter who is in child protective services.  Pt denies SI/HI and verbally contracts for safety.  Pt denies AVH.  A: Labs/Vitals monitored; Medication administered; Provided reassurance.  R:  Pt remains safe on unit with Q 15 minute safety checks   04/02/21 2114  Psych Admission Type (Psych Patients Only)  Admission Status Voluntary  Psychosocial Assessment  Patient Complaints Anxiety;Depression  Eye Contact Fair  Facial Expression Anxious  Affect Appropriate to circumstance  Speech Logical/coherent  Interaction Assertive  Motor Activity Other (Comment) (wdl)  Appearance/Hygiene Unremarkable  Behavior Characteristics Cooperative  Mood Pleasant;Anxious  Thought Process  Coherency WDL  Content WDL  Delusions None reported or observed  Perception WDL  Hallucination None reported or observed  Judgment Impaired  Confusion None  Danger to Self  Current suicidal ideation? Denies  Danger to Others  Danger to Others None reported or observed
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
## 2021-04-03 NOTE — Plan of Care (Signed)
  Problem: Education: Goal: Emotional status will improve Outcome: Not Progressing Goal: Mental status will improve Outcome: Not Progressing Goal: Verbalization of understanding the information provided will improve Outcome: Not Progressing   

## 2021-04-03 NOTE — Progress Notes (Signed)
Pt denied SI/HI/AVH.  Pt has been calm and cooperative this shift.  Pt interacting with peers on the unit.  RN established rapport with patient and assessed for needs and concerns.  RN actively listened to pt and provided support and reassurance.  Pt remains safe on the unit with q 15 min room checks in place.       04/03/21 1000  Psych Admission Type (Psych Patients Only)  Admission Status Voluntary  Psychosocial Assessment  Patient Complaints Anxiety;Depression  Eye Contact Fair  Facial Expression Anxious  Affect Appropriate to circumstance  Speech Logical/coherent  Interaction Assertive  Motor Activity Other (Comment) (wdl)  Appearance/Hygiene Unremarkable  Behavior Characteristics Cooperative  Mood Depressed;Anxious  Thought Process  Coherency WDL  Content WDL  Delusions None reported or observed  Perception WDL  Hallucination None reported or observed  Judgment Impaired  Confusion None  Danger to Self  Current suicidal ideation? Denies  Danger to Others  Danger to Others None reported or observed
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
## 2021-04-03 NOTE — Progress Notes (Signed)
Recreation Therapy Notes  Date: 8.26.22 Time: 0930 Location: [address] Dayroom  Group Topic: Stress Management   Goal Area(s) Addresses:  Patient will actively participate in stress management techniques presented during session.  Patient will successfully identify benefit of practicing stress management post d/c.   Intervention: Relaxation exercise with ambient sound and script   Activity: Guided Imagery. LRT provided education, instruction, and demonstration on practice of visualization via guided imagery. Patients was asked to participate in the technique introduced during session. Patients were given suggestions of ways to access scripts post d/c and encouraged to explore Youtube and other apps available on smartphones, tablets, and computers.  Education:  Stress Management, Discharge Planning.   Education Outcome: Acknowledges education  Clinical Observations/Feedback: Patient did not attend group session.    Caroll Rancher, LRT/CTRS         Lillia Abed, Misako Roeder A 04/03/2021 11:00 AM

## 2021-04-03 NOTE — Progress Notes (Signed)
Pt did not attend group after verbal prompt. 

## 2021-04-03 NOTE — Progress Notes (Signed)
  Please note that pt's personal birth control have been retrieved from personal belongings per pt's request and placed in pt box for use.  Provider confirmed medication  with pharmacist and approved for use.

## 2021-04-04 ENCOUNTER — Encounter (HOSPITAL_COMMUNITY): Payer: Self-pay | Admitting: Student

## 2021-04-04 DIAGNOSIS — R45851 Suicidal ideations: Secondary | ICD-10-CM

## 2021-04-04 DIAGNOSIS — F419 Anxiety disorder, unspecified: Secondary | ICD-10-CM | POA: Diagnosis not present

## 2021-04-04 DIAGNOSIS — F319 Bipolar disorder, unspecified: Secondary | ICD-10-CM | POA: Diagnosis not present

## 2021-04-04 MED ORDER — LURASIDONE HCL 20 MG PO TABS
60.0000 mg | ORAL_TABLET | Freq: Every day | ORAL | Status: DC
  Administered 2021-04-04: 60 mg via ORAL
  Filled 2021-04-04 (×4): qty 3

## 2021-04-04 NOTE — [facility] Suicide Risk Assessment (Signed)
[facility] INPATIENT:  Family/Significant Other Suicide Prevention Education  Suicide Prevention Education:  Education Completed; Lawanda Cousins, Partner, [phone],  (name of family member/significant other) has been identified by the patient as the family member/significant other with whom the patient will be residing, and identified as the person(s) who will aid the patient in the event of a mental health crisis (suicidal ideations/suicide attempt).  With written consent from the patient, the family member/significant other has been provided the following suicide prevention education, prior to the and/or following the discharge of the patient.  The suicide prevention education provided includes the following: Suicide risk factors Suicide prevention and interventions National Suicide Hotline telephone number [facility] assessment telephone number [facility] Emergency Assistance 911 [facility] and/or Residential Mobile Crisis Unit telephone number  Request made of family/significant other to: Remove weapons (e.g., guns, rifles, knives), all items previously/currently identified as safety concern.   Remove drugs/medications (over-the-counter, prescriptions, illicit drugs), all items previously/currently identified as a safety concern.  The family member/significant other verbalizes understanding of the suicide prevention education information provided.  The family member/significant other agrees to remove the items of safety concern listed above.  "She has been very overwhelmed. We are in the middle of a foster care situation with our daughter and we are having issues with our family. Also we have had a lot of ups and downs in our relationship. She can come back to stay with me at discharge.".   -No weapons in home -Medications secured and partner administers medications to her when she is suicidal  -no safety concerns  Felizardo Hoffmann 04/04/2021, 11:56 AM
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
## 2021-04-04 NOTE — [facility] Group Notes (Signed)
Patient feels like an 8 out of 10 Wants to work on positive thinking

## 2021-04-04 NOTE — [facility] Group Notes (Signed)
LCSW Group Therapy Note  04/04/2021  Type of Therapy and Topic:  Group Therapy - Healthy vs Unhealthy Coping Skills  Participation Level:  Active   Description of Group The focus of this group was to determine what unhealthy coping techniques typically are used by group members and what healthy coping techniques would be helpful in coping with various problems. Patients were guided in becoming aware of the differences between healthy and unhealthy coping techniques. Patients were asked to identify 2-3 healthy coping skills they would like to learn to use more effectively, and many mentioned meditation, breathing, and relaxation. These were explained, samples demonstrated, and resources shared for how to learn more at discharge. At group closing, additional ideas of healthy coping skills were shared in a fun exercise.  Therapeutic Goals Patients learned that coping is what human beings do all day long to deal with various situations in their lives Patients defined and discussed healthy vs unhealthy coping techniques Patients identified their preferred coping techniques and identified whether these were healthy or unhealthy Patients determined 2-3 healthy coping skills they would like to become more familiar with and use more often, and practiced a few medications Patients provided support and ideas to each other   Summary of Patient Progress:  Pt was given packet of worksheets discussing coping skills. Pt was encouraged to seek out CSW if any questions arose while completing the worksheets.    Therapeutic Modalities Cognitive Behavioral Therapy Motivational Interviewing  Eathan Groman, LCSWA Clinicial Social Worker [facility]   

## 2021-04-04 NOTE — Progress Notes (Signed)
   04/04/21 2214  Psych Admission Type (Psych Patients Only)  Admission Status Voluntary  Psychosocial Assessment  Patient Complaints Anxiety  Eye Contact Brief  Facial Expression Flat  Affect Appropriate to circumstance  Speech Logical/coherent  Interaction Guarded  Motor Activity Other (Comment) (WDL)  Appearance/Hygiene Unremarkable  Behavior Characteristics Appropriate to situation  Mood Depressed  Thought Process  Coherency WDL  Content WDL  Delusions None reported or observed  Perception WDL  Hallucination None reported or observed  Judgment Poor  Confusion None  Danger to Self  Current suicidal ideation? Denies  Danger to Others  Danger to Others None reported or observed

## 2021-04-04 NOTE — Progress Notes (Signed)
   04/04/21 1900  Psych Admission Type (Psych Patients Only)  Admission Status Voluntary  Psychosocial Assessment  Patient Complaints Anxiety  Eye Contact Brief  Facial Expression Anxious  Affect Appropriate to circumstance  Speech Logical/coherent  Interaction Avoidant;Cautious  Motor Activity Other (Comment) (appropriate)  Appearance/Hygiene Unremarkable  Behavior Characteristics Cooperative  Mood Anxious  Thought Process  Coherency WDL  Content WDL  Delusions None reported or observed  Perception WDL  Hallucination None reported or observed  Judgment Impaired  Confusion None  Danger to Self  Current suicidal ideation? Denies  Danger to Others  Danger to Others None reported or observed

## 2021-04-04 NOTE — [facility] Group Notes (Signed)
  negatives  Smoking  Cutting Over eating Over sleeping   [facility]

## 2021-04-05 MED ORDER — SPRINTEC 28 0.25-35 MG-MCG PO TABS: 1.0000 | ORAL_TABLET | Freq: Every day | ORAL | 11 refills | Status: DC

## 2021-04-05 MED ORDER — SERTRALINE HCL 100 MG PO TABS: 100.0000 mg | ORAL_TABLET | Freq: Every day | ORAL | 0 refills | Status: DC

## 2021-04-05 MED ORDER — FERROUS SULFATE 325 (65 FE) MG PO TBEC: 325.0000 mg | DELAYED_RELEASE_TABLET | Freq: Every day | ORAL | 0 refills | Status: DC

## 2021-04-05 MED ORDER — LATUDA 20 MG PO TABS: 60.0000 mg | ORAL_TABLET | Freq: Every day | ORAL | 0 refills | Status: DC

## 2021-04-05 MED ORDER — HYDROXYZINE HCL 25 MG PO TABS: 25.0000 mg | ORAL_TABLET | Freq: Three times a day (TID) | ORAL | 0 refills | Status: DC | PRN

## 2021-04-05 MED ORDER — TRAZODONE HCL 50 MG PO TABS: 50.0000 mg | ORAL_TABLET | Freq: Every evening | ORAL | 0 refills | Status: DC | PRN

## 2021-04-05 NOTE — Progress Notes (Signed)
Discharge Note:  Patient discharged home.  Suicide prevention information given and discussed with patient who stated she understood and had no questions.  Patient denied SI and HI.  Denied A/V hallucinations.  Patient stated she received all her belongings.  Patient  stated she appreciated all assistance received from [facility] staff.  All required discharge information given.

## 2021-04-05 NOTE — Plan of Care (Signed)
Nurse discussed coping skills and anxiety with patient.  

## 2021-04-05 NOTE — [facility] Group Notes (Signed)
[facility] Group Notes:  (Nursing/MHT/Case Management/Adjunct)  Date:  04/05/2021  Time:  10:01 AM  Type of Therapy:  Group Therapy  Participation Level:  Active  Participation Quality:  Appropriate  Affect:  Appropriate  Cognitive:  Appropriate  Insight:  Appropriate  Engagement in Group:  Engaged  Modes of Intervention:  Discussion  Summary of Progress/Problems:  Patient attended goals group today and stayed appropriate throughout group. Patient's goal for today is to work on her discharge. Patient's long term goal is to continue using the skills she learned here and continue going to her therapy appointments.  Osvaldo Human R Tyrail Grandfield 04/05/2021, 10:01 AM

## 2021-04-05 NOTE — [facility] Group Notes (Signed)
LCSW Group Therapy Notes    Type of Therapy and Topic: Group Therapy: Effective Communication   Participation Level: Active   Description of Group:  In this group patients will be asked to identify their own styles of communication as well as defining and identifying passive, assertive, and aggressive styles of communication. Participants will identify strategies to communicate in a more assertive manner in an effort to appropriately meet their needs. This group will be process-oriented, with patients participating in exploration of their own experiences as well as giving and receiving support and challenge from other group members.   Therapeutic Goals: 1. Patient will identify their personal communication style. 2. Patient will identify passive, assertive, and aggressive forms of communication. 3. Patient will identify strategies for developing more effective communication to appropriately meet their needs.      Summary of Patient Progress:   This group has been supplemented with worksheets. Patient was given opportunity to meet with CSW one on one to review worksheets.     Therapeutic Modalities:  Communication Skills Solution Focused Therapy Motivational Interviewing     Naksh Radi MSW, LCSW Clincal Social Worker  [facility] 

## 2021-04-05 NOTE — Progress Notes (Signed)
  [facility] Adult Case Management Discharge Plan :  Will you be returning to the same living situation after discharge:  Yes,  staying with boyfriend At discharge, do you have transportation home?: No. Safe Transport to be arranged Do you have the ability to pay for your medications: Yes,  has insurance  Release of information consent forms completed and in the chart;  Patient's signature needed at discharge.  Patient to Follow up at:  Follow-up Information     [facility] Follow up on 04/17/2021.   Specialty: Behavioral Health Why: You have an appointment on 04/17/21 at 9:30 am for therapy services via telehealth.  You also have an appointment for medication management on 05/04/21 at 2:00 pm (This appointment will be held In Person) Air traffic controller information: [address] [place] [address] [phone]         [facility] Follow up.   Why: Please call to schedule an appointment for DBT therapy services with this provider. Contact information: [phone] [place] [address] [phone]                 Next level of care provider has access to [facility] Link:no  Safety Planning and Suicide Prevention discussed: Yes,  partner     Has patient been referred to the Quitline?: Patient refused referral  Patient has been referred for addiction treatment: Pt. refused referral  Otelia Santee, LCSW 04/05/2021, 11:44 AM
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
## 2021-04-05 NOTE — Progress Notes (Signed)
D:  Patient's self inventory sheet, patient sleeps good, no sleep medication.  Good appetite, normal energy level, good concentration.  Denied depression, anxiety and hopeless.  Denied withdrawals.  Denied SI.  Denied physical problems.  Denied physical pain.  Goal is work on discharge plans.  Use coping skills. A:  Medications administered per MD orders.  Emotional support and encouragement given patient. R:  Denied SI and HI, contracts for safety.  Denied A/V hallucinations. Safety maintained with 15 minute checks.
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
## 2021-04-06 NOTE — Group Note (Deleted)
Occupational Therapy Group Note  Group Topic:Feelings Management  Group Date: 04/06/2021 Start Time: 1400 End Time: 1450 Facilitators: Donne Hazel, OT   Group Description:    Therapeutic Goal(s):    Participation Level: {OT BHH Participation UEKCM:03491}   Participation Quality: {OT BHH Participation Quality:26268}   Behavior: {BHH OT Group Behavior:26269}   Speech/Thought Process: {BHH OT Speech/Thought Process:26270}   Affect/Mood: {OT BHH Affect/Mood:26271}   Insight: {OT BHH Insight:26272}   Judgement: {OT BHH Judgement:26272}   Individualization: *** was *** in their participation of group discussion/activity. *** identified  Modes of Intervention: {BHH MODES OF INTERVENTION:26273}  Patient Response to Interventions:  {BHH OT Patient Response to Interventions:26274}   Plan: Continue to engage patient in OT groups 2 - 3x/week.  04/06/2021  Donne Hazel, OT

## 2021-04-20 ENCOUNTER — Emergency Department (HOSPITAL_COMMUNITY)
Admission: EM | Admit: 2021-04-20 | Discharge: 2021-04-22 | Payer: Medicaid Other | Attending: Emergency Medicine | Admitting: Emergency Medicine

## 2021-04-20 ENCOUNTER — Emergency Department (HOSPITAL_COMMUNITY): Payer: Medicaid Other

## 2021-04-20 ENCOUNTER — Other Ambulatory Visit: Payer: Self-pay

## 2021-04-20 ENCOUNTER — Encounter (HOSPITAL_COMMUNITY): Payer: Self-pay

## 2021-04-20 DIAGNOSIS — F430 Acute stress reaction: Secondary | ICD-10-CM

## 2021-04-20 DIAGNOSIS — F332 Major depressive disorder, recurrent severe without psychotic features: Secondary | ICD-10-CM | POA: Diagnosis present

## 2021-04-20 DIAGNOSIS — R519 Headache, unspecified: Secondary | ICD-10-CM | POA: Diagnosis not present

## 2021-04-20 DIAGNOSIS — R55 Syncope and collapse: Secondary | ICD-10-CM | POA: Insufficient documentation

## 2021-04-20 DIAGNOSIS — R45851 Suicidal ideations: Secondary | ICD-10-CM | POA: Insufficient documentation

## 2021-04-20 DIAGNOSIS — U071 COVID-19: Secondary | ICD-10-CM | POA: Diagnosis not present

## 2021-04-20 DIAGNOSIS — F17210 Nicotine dependence, cigarettes, uncomplicated: Secondary | ICD-10-CM | POA: Insufficient documentation

## 2021-04-20 DIAGNOSIS — J45909 Unspecified asthma, uncomplicated: Secondary | ICD-10-CM | POA: Diagnosis not present

## 2021-04-20 DIAGNOSIS — Y900 Blood alcohol level of less than 20 mg/100 ml: Secondary | ICD-10-CM | POA: Insufficient documentation

## 2021-04-20 DIAGNOSIS — F419 Anxiety disorder, unspecified: Secondary | ICD-10-CM

## 2021-04-20 LAB — COMPREHENSIVE METABOLIC PANEL
ALT: 17 U/L (ref 0–44)
AST: 22 U/L (ref 15–41)
Albumin: 4.9 g/dL (ref 3.5–5.0)
Alkaline Phosphatase: 56 U/L (ref 38–126)
Anion gap: 14 (ref 5–15)
BUN: 14 mg/dL (ref 6–20)
CO2: 27 mmol/L (ref 22–32)
Calcium: 9.9 mg/dL (ref 8.9–10.3)
Chloride: 99 mmol/L (ref 98–111)
Creatinine, Ser: 0.82 mg/dL (ref 0.44–1.00)
GFR, Estimated: >60 mL/min (ref >60)
Glucose, Bld: 107 mg/dL — ABNORMAL HIGH (ref 70–99)
Potassium: 3.7 mmol/L (ref 3.5–5.1)
Sodium: 140 mmol/L (ref 135–145)
Total Bilirubin: 0.6 mg/dL (ref 0.3–1.2)
Total Protein: 8.6 g/dL — ABNORMAL HIGH (ref 6.5–8.1)

## 2021-04-20 LAB — CBC
HCT: 45.3 % (ref 36.0–46.0)
Hemoglobin: 15.5 g/dL — ABNORMAL HIGH (ref 12.0–15.0)
MCH: 32.2 pg (ref 26.0–34.0)
MCHC: 34.2 g/dL (ref 30.0–36.0)
MCV: 94.2 fL (ref 80.0–100.0)
Platelets: 227 10*3/uL (ref 150–400)
RBC: 4.81 MIL/uL (ref 3.87–5.11)
RDW: 12.7 % (ref 11.5–15.5)
WBC: 8.1 10*3/uL (ref 4.0–10.5)
nRBC: 0 % (ref 0.0–0.2)

## 2021-04-20 LAB — RAPID URINE DRUG SCREEN, HOSP PERFORMED
Amphetamines: NOT DETECTED
Barbiturates: NOT DETECTED
Benzodiazepines: NOT DETECTED
Cocaine: NOT DETECTED
Opiates: NOT DETECTED
Tetrahydrocannabinol: POSITIVE — AB

## 2021-04-20 LAB — ACETAMINOPHEN LEVEL: Acetaminophen (Tylenol), Serum: <10 ug/mL — ABNORMAL LOW (ref 10–30)

## 2021-04-20 LAB — I-STAT BETA HCG BLOOD, ED (MC, WL, AP ONLY): I-stat hCG, quantitative: <5 m[IU]/mL (ref <5)

## 2021-04-20 LAB — RESP PANEL BY RT-PCR (FLU A&B, COVID) ARPGX2
Influenza A by PCR: NEGATIVE
Influenza B by PCR: NEGATIVE
SARS Coronavirus 2 by RT PCR: POSITIVE — AB

## 2021-04-20 LAB — SALICYLATE LEVEL: Salicylate Lvl: <7 mg/dL — ABNORMAL LOW (ref 7.0–30.0)

## 2021-04-20 LAB — ETHANOL: Alcohol, Ethyl (B): <10 mg/dL (ref <10)

## 2021-04-20 MED ORDER — ACETAMINOPHEN 500 MG PO TABS
1000.0000 mg | ORAL_TABLET | Freq: Once | ORAL | Status: AC
  Administered 2021-04-20: 1000 mg via ORAL
  Filled 2021-04-20: qty 2

## 2021-04-20 MED ORDER — NORGESTIMATE-ETH ESTRADIOL 0.25-35 MG-MCG PO TABS: 1.0000 | ORAL_TABLET | Freq: Every day | ORAL | Status: DC

## 2021-04-20 MED ORDER — LORAZEPAM 2 MG/ML IJ SOLN
2.0000 mg | Freq: Once | INTRAMUSCULAR | Status: AC
  Administered 2021-04-20: 2 mg via INTRAMUSCULAR
  Filled 2021-04-20: qty 1

## 2021-04-20 MED ORDER — TRAZODONE HCL 50 MG PO TABS
50.0000 mg | ORAL_TABLET | Freq: Every evening | ORAL | Status: DC | PRN
  Administered 2021-04-20: 50 mg via ORAL
  Filled 2021-04-20: qty 1

## 2021-04-20 MED ORDER — LURASIDONE HCL 20 MG PO TABS
60.0000 mg | ORAL_TABLET | Freq: Every day | ORAL | Status: DC
  Administered 2021-04-20 – 2021-04-21 (×2): 60 mg via ORAL
  Filled 2021-04-20 (×3): qty 3

## 2021-04-20 MED ORDER — IOHEXOL 350 MG/ML SOLN
100.0000 mL | Freq: Once | INTRAVENOUS | Status: AC | PRN
  Administered 2021-04-20: 100 mL via INTRAVENOUS

## 2021-04-20 MED ORDER — FERROUS SULFATE 325 (65 FE) MG PO TABS
325.0000 mg | ORAL_TABLET | Freq: Every day | ORAL | Status: DC
  Administered 2021-04-20 – 2021-04-22 (×2): 325 mg via ORAL
  Filled 2021-04-20 (×2): qty 1

## 2021-04-20 MED ORDER — HYDROXYZINE HCL 25 MG PO TABS
25.0000 mg | ORAL_TABLET | Freq: Three times a day (TID) | ORAL | Status: DC | PRN
  Administered 2021-04-20 – 2021-04-22 (×2): 25 mg via ORAL
  Filled 2021-04-20 (×2): qty 1

## 2021-04-20 MED ORDER — SERTRALINE HCL 50 MG PO TABS
100.0000 mg | ORAL_TABLET | Freq: Every day | ORAL | Status: DC
  Administered 2021-04-20 – 2021-04-22 (×2): 100 mg via ORAL
  Filled 2021-04-20 (×2): qty 2

## 2021-04-20 NOTE — BH Assessment (Signed)
This Probation officer met with patient this date after she was removed from restraints. Patient contnues to be agitated and renders limited history. Patient contnues to express ongoing S/I when asked in reference to self harm. As this writer attempts to further question patient she pulls the covers over her head and terminates interview. Dolby NP recommends patient continued to be monitored and observed.

## 2021-04-20 NOTE — ED Notes (Signed)
[employer] has served an order from a [facility] judge to disclose when patient is discharged so she can be taken to jail.  Security has a copy and Carley, RN has seen the document as well.

## 2021-04-20 NOTE — ED Notes (Signed)
Patient seen by this nurse and Genevie Cheshire, RN rubbing the blanket against her inner left forearm cause a very large red area.  I explained to patient that she needed to stop of I would remove all blankets.
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
## 2021-04-20 NOTE — ED Notes (Signed)
Patient released from 4 point soft restraints to eat and go to the restroom.  Patient made aware if she continues to remain calm she will be allowed to remain out of restraints, patient verbalized understanding.  [employer] currently still at bedside and sitter outside room with patient in her view at all times.
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
## 2021-04-20 NOTE — ED Notes (Signed)
Pt changed into hospital provided wine colored scrubs. Belongings gathered and placed in secure holdings.

## 2021-04-21 MED ORDER — MIDAZOLAM HCL 2 MG/2ML IJ SOLN
4.0000 mg | Freq: Once | INTRAMUSCULAR | Status: AC
  Administered 2021-04-21: 4 mg via INTRAMUSCULAR
  Filled 2021-04-21: qty 4

## 2021-04-21 MED ORDER — ZIPRASIDONE MESYLATE 20 MG IM SOLR
INTRAMUSCULAR | Status: AC
  Administered 2021-04-21: 10 mg via INTRAMUSCULAR
  Filled 2021-04-21: qty 20

## 2021-04-21 MED ORDER — ZIPRASIDONE MESYLATE 20 MG IM SOLR: 10.0000 mg | Freq: Once | INTRAMUSCULAR | Status: AC

## 2021-04-21 MED ORDER — STERILE WATER FOR INJECTION IJ SOLN
INTRAMUSCULAR | Status: AC
  Administered 2021-04-21: 10 mL
  Filled 2021-04-21: qty 10

## 2021-04-21 NOTE — Consult Note (Signed)
[facility] ED Progress Note  04/21/2021 2:01 PM Sharon Ward  MRN:  [id number]   Method of visit?: Face to Face   Subjective:  Sharon Ward seen by this provider face to face at [facility]. Patient reports she is suicidal with plan to overdose. She knows that once she is discharged she will be going to jail. She is cooperative with interview. Denies homicidal ideation, denies auditory and visual hallucinations.   Clinical research associate for safety. ED staff report that patient has not had behavioral outburst this shift so far.   Denies substance and alcohol use. She is depressed with congruent affect.  Reports many previous attempts of suicide in the past.   Principal Problem: Major depressive disorder, recurrent severe without psychotic features (HCC) Diagnosis:  Principal Problem:   Major depressive disorder, recurrent severe without psychotic features (HCC) Active Problems:   Suicidal ideation  Total Time spent with patient: 30 minutes  Past Psychiatric History:   Past Medical History:  Past Medical History:  Diagnosis Date   Asthma    Bipolar 1 disorder, mixed (HCC)    Depression    Generalized anxiety disorder    Relationship dysfunction     Past Surgical History:  Procedure Laterality Date   PILONIDAL CYST / SINUS EXCISION  09/11/2013   PILONIDAL CYST EXCISION  05/17/2014   Pilonidal cystectomy with cleft lip   Family History:  Family History  Problem Relation Age of Onset   Healthy Mother    Healthy Father    Family Psychiatric  History:  Social History:  Social History   Substance and Sexual Activity  Alcohol Use Not Currently   Comment: occasional prior to pregnancy     Social History   Substance and Sexual Activity  Drug Use Not Currently   Types: Marijuana   Comment: reports use 4 or 5 times; last used 05/04/19    Social History   Socioeconomic History   Marital status: Significant Other    Spouse name: Not on file   Number of children: Not on file    Years of education: Not on file   Highest education level: Not on file  Occupational History   Not on file  Tobacco Use   Smoking status: Every Day    Packs/day: 0.25    Years: 4.00    Pack years: 1.00    Types: Cigarettes   Smokeless tobacco: Never   Tobacco comments:    only smoke a "couple" of cigarettes when stressed or anxious, socially with friends per [facility] chart  Vaping Use   Vaping Use: Never used  Substance and Sexual Activity   Alcohol use: Not Currently    Comment: occasional prior to pregnancy   Drug use: Not Currently    Types: Marijuana    Comment: reports use 4 or 5 times; last used 05/04/19   Sexual activity: Yes    Partners: Male  Other Topics Concern   Not on file  Social History Narrative   Not on file   Social Determinants of Health   Financial Resource Strain: Not on file  Food Insecurity: Food Insecurity Present   Worried About Running Out of Food in the Last Year: Sometimes true   Ran Out of Food in the Last Year: Sometimes true  Transportation Needs: No Transportation Needs   Lack of Transportation (Medical): No   Lack of Transportation (Non-Medical): No  Physical Activity: Not on file  Stress: Not on file  Social Connections: Not on file  Sleep: Good  Appetite:  Good  Current Medications: Current Facility-Administered Medications  Medication Dose Route Frequency Provider Last Rate Last Admin   ferrous sulfate tablet 325 mg  325 mg Oral QHS Muthersbaugh, Hannah, PA-C   325 mg at 04/20/21 2051   hydrOXYzine (ATARAX/VISTARIL) tablet 25 mg  25 mg Oral TID PRN Muthersbaugh, Dahlia Client, PA-C   25 mg at 04/20/21 1252   lurasidone (LATUDA) tablet 60 mg  60 mg Oral QPC supper Muthersbaugh, Dahlia Client, PA-C   60 mg at 04/20/21 2051   midazolam (VERSED) injection 4 mg  4 mg Intramuscular Once Ernie Avena, MD       norgestimate-ethinyl estradiol (ORTHO-CYCLEN) 0.25-35 MG-MCG tablet 1 tablet  1 tablet Oral Daily Muthersbaugh, Dahlia Client, PA-C       sertraline  (ZOLOFT) tablet 100 mg  100 mg Oral QHS Muthersbaugh, Hannah, PA-C   100 mg at 04/20/21 2052   traZODone (DESYREL) tablet 50 mg  50 mg Oral QHS PRN Muthersbaugh, Dahlia Client, PA-C   50 mg at 04/20/21 2052   Current Outpatient Medications  Medication Sig Dispense Refill   ferrous sulfate 325 (65 FE) MG EC tablet Take 1 tablet (325 mg total) by mouth at bedtime. For anemia  0   hydrOXYzine (ATARAX/VISTARIL) 25 MG tablet Take 1 tablet (25 mg total) by mouth 3 (three) times daily as needed for anxiety. 75 tablet 0   lurasidone (LATUDA) 20 MG TABS tablet Take 3 tablets (60 mg total) by mouth daily after supper. For mood control 90 tablet 0   Prenatal Vit-Fe Fumarate-FA (MULTIVITAMIN-PRENATAL) 27-0.8 MG TABS tablet Take 1 tablet by mouth at bedtime.     sertraline (ZOLOFT) 100 MG tablet Take 1 tablet (100 mg total) by mouth at bedtime. For depression 30 tablet 0   SPRINTEC 28 0.25-35 MG-MCG tablet Take 1 tablet by mouth daily. 28 tablet 11   traZODone (DESYREL) 50 MG tablet Take 1 tablet (50 mg total) by mouth at bedtime as needed for sleep. 30 tablet 0    Lab Results:  Results for orders placed or performed during the hospital encounter of 04/20/21 (from the past 48 hour(s))  Rapid urine drug screen (hospital performed)     Status: Abnormal   Collection Time: 04/20/21 12:15 AM  Result Value Ref Range   Opiates NONE DETECTED NONE DETECTED   Cocaine NONE DETECTED NONE DETECTED   Benzodiazepines NONE DETECTED NONE DETECTED   Amphetamines NONE DETECTED NONE DETECTED   Tetrahydrocannabinol POSITIVE (A) NONE DETECTED   Barbiturates NONE DETECTED NONE DETECTED    Comment: (NOTE) DRUG SCREEN FOR MEDICAL PURPOSES ONLY.  IF CONFIRMATION IS NEEDED FOR ANY PURPOSE, NOTIFY LAB WITHIN 5 DAYS.  LOWEST DETECTABLE LIMITS FOR URINE DRUG SCREEN Drug Class                     Cutoff (ng/mL) Amphetamine and metabolites    1000 Barbiturate and metabolites    200 Benzodiazepine                 200 Tricyclics and  metabolites     300 Opiates and metabolites        300 Cocaine and metabolites        300 THC                            50 Performed at [facility], [address], [place], [place] [address]   Comprehensive metabolic panel  Status: Abnormal   Collection Time: 04/20/21 12:40 AM  Result Value Ref Range   Sodium 140 135 - 145 mmol/L   Potassium 3.7 3.5 - 5.1 mmol/L   Chloride 99 98 - 111 mmol/L   CO2 27 22 - 32 mmol/L   Glucose, Bld 107 (H) 70 - 99 mg/dL    Comment: Glucose reference range applies only to samples taken after fasting for at least 8 hours.   BUN 14 6 - 20 mg/dL   Creatinine, Ser 4.23 0.44 - 1.00 mg/dL   Calcium 9.9 8.9 - 95.3 mg/dL   Total Protein 8.6 (H) 6.5 - 8.1 g/dL   Albumin 4.9 3.5 - 5.0 g/dL   AST 22 15 - 41 U/L   ALT 17 0 - 44 U/L   Alkaline Phosphatase 56 38 - 126 U/L   Total Bilirubin 0.6 0.3 - 1.2 mg/dL   GFR, Estimated >20 >23 mL/min    Comment: (NOTE) Calculated using the CKD-EPI Creatinine Equation (2021)    Anion gap 14 5 - 15    Comment: Performed at [facility], [address], [place], [place] [address]  Ethanol     Status: None   Collection Time: 04/20/21 12:40 AM  Result Value Ref Range   Alcohol, Ethyl (B) <10 <10 mg/dL    Comment: (NOTE) Lowest detectable limit for serum alcohol is 10 mg/dL.  For medical purposes only. Performed at [facility], [address], [place], [place] [address]   Salicylate level     Status: Abnormal   Collection Time: 04/20/21 12:40 AM  Result Value Ref Range   Salicylate Lvl <7.0 (L) 7.0 - 30.0 mg/dL    Comment: Performed at [facility], [address] [place], [place] [address]  Acetaminophen level     Status: Abnormal   Collection Time: 04/20/21 12:40 AM  Result Value Ref Range   Acetaminophen (Tylenol), Serum <10 (L) 10 - 30 ug/mL    Comment: (NOTE) Therapeutic concentrations vary significantly. A range of 10-30  ug/mL  may be an effective concentration for many patients. However, some  are best treated at concentrations outside of this range. Acetaminophen concentrations >150 ug/mL at 4 hours after ingestion  and >50 ug/mL at 12 hours after ingestion are often associated with  toxic reactions.  Performed at [facility], [address] [place], [place] [address]   cbc     Status: Abnormal   Collection Time: 04/20/21 12:40 AM  Result Value Ref Range   WBC 8.1 4.0 - 10.5 K/uL   RBC 4.81 3.87 - 5.11 MIL/uL   Hemoglobin 15.5 (H) 12.0 - 15.0 g/dL   HCT 52.0 80.2 - 23.3 %   MCV 94.2 80.0 - 100.0 fL   MCH 32.2 26.0 - 34.0 pg   MCHC 34.2 30.0 - 36.0 g/dL   RDW 61.2 24.4 - 97.5 %   Platelets 227 150 - 400 K/uL   nRBC 0.0 0.0 - 0.2 %    Comment: Performed at [facility], [address], [place], [place] [address]  I-Stat beta hCG blood, ED     Status: None   Collection Time: 04/20/21 12:40 AM  Result Value Ref Range   I-stat hCG, quantitative <5.0 <5 mIU/mL   Comment 3            Comment:   GEST. AGE      CONC.  (mIU/mL)   <=1 WEEK  5 - 50     2 WEEKS       50 - 500     3 WEEKS       100 - 10,000     4 WEEKS     1,000 - 30,000        FEMALE AND NON-PREGNANT FEMALE:     LESS THAN 5 mIU/mL   Resp Panel by RT-PCR (Flu A&B, Covid) Nasopharyngeal Swab     Status: Abnormal   Collection Time: 04/20/21  5:54 AM   Specimen: Nasopharyngeal Swab; Nasopharyngeal(NP) swabs in vial transport medium  Result Value Ref Range   SARS Coronavirus 2 by RT PCR POSITIVE (A) NEGATIVE    Comment: RESULT CALLED TO, READ BACK BY AND VERIFIED WITHAdora Fridge, RN [phone] 04/20/21 [facility] (NOTE) SARS-CoV-2 target nucleic acids are DETECTED.  The SARS-CoV-2 RNA is generally detectable in upper respiratory specimens during the acute phase of infection. Positive results are indicative of the presence of the identified virus, but do not rule out bacterial infection or  co-infection with other pathogens not detected by the test. Clinical correlation with patient history and other diagnostic information is necessary to determine patient infection status. The expected result is Negative.  Fact Sheet for Patients: BloggerCourse.com  Fact Sheet for Healthcare Providers: SeriousBroker.it  This test is not yet approved or cleared by the [place] FDA and  has been authorized for detection and/or diagnosis of SARS-CoV-2 by FDA under an Emergency Use Authorization (EUA).  This EUA will remain in effect (meaning this test can b e used) for the duration of  the COVID-19 declaration under Section 564(b)(1) of the Act, 21 U.S.C. section 360bbb-3(b)(1), unless the authorization is terminated or revoked sooner.     Influenza A by PCR NEGATIVE NEGATIVE   Influenza B by PCR NEGATIVE NEGATIVE    Comment: (NOTE) The Xpert Xpress SARS-CoV-2/FLU/RSV plus assay is intended as an aid in the diagnosis of influenza from Nasopharyngeal swab specimens and should not be used as a sole basis for treatment. Nasal washings and aspirates are unacceptable for Xpert Xpress SARS-CoV-2/FLU/RSV testing.  Fact Sheet for Patients: BloggerCourse.com  Fact Sheet for Healthcare Providers: SeriousBroker.it  This test is not yet approved or cleared by the [place] FDA and has been authorized for detection and/or diagnosis of SARS-CoV-2 by FDA under an Emergency Use Authorization (EUA). This EUA will remain in effect (meaning this test can be used) for the duration of the COVID-19 declaration under Section 564(b)(1) of the Act, 21 U.S.C. section 360bbb-3(b)(1), unless the authorization is terminated or revoked.  Performed at [facility], [address], [place], [place] [address]     Blood Alcohol level:  Lab Results  Component Value Date   ETH <10  04/20/2021   ETH <10 04/01/2021    Physical Findings: AIMS:  , ,  ,  ,    CIWA:    COWS:     Musculoskeletal: Strength & Muscle Tone: within normal limits Gait & Station: normal Patient leans: N/A  Psychiatric Specialty Exam:  Presentation  General Appearance: Appropriate for Environment  Eye Contact:Good  Speech:Clear and Coherent; Normal Rate  Speech Volume:Normal  Handedness:Right   Mood and Affect  Mood:Depressed  Affect:Congruent   Thought Process  Thought Processes:Coherent; Goal Directed  Descriptions of Associations:Intact  Orientation:Full (Time, Place and Person)  Thought Content:Logical  History of Schizophrenia/Schizoaffective disorder:No  Duration of Psychotic Symptoms:No data recorded Hallucinations:Hallucinations: None Ideas of Reference:None  Suicidal Thoughts:Suicidal Thoughts: Yes,  Active SI Active Intent and/or Plan: With Intent; With Plan Homicidal Thoughts:Homicidal Thoughts: No  Sensorium  Memory:Immediate Good; Recent Good; Remote Good  Judgment:Impaired  Insight:Fair   Executive Functions  Concentration:Good  Attention Span:Good  Recall:Good  Fund of Knowledge:Good  Language:Good   Psychomotor Activity  Psychomotor Activity: Psychomotor Activity: Normal  Assets  Assets:Communication Skills; Desire for Improvement; Financial Resources/Insurance; Housing; Physical Health; Social Support   Sleep  Sleep: Sleep: Good   Physical Exam: Physical Exam Vitals reviewed.  Cardiovascular:     Rate and Rhythm: Bradycardia present.  Pulmonary:     Effort: Pulmonary effort is normal.  Skin:    General: Skin is warm and dry.  Neurological:     Mental Status: She is alert and oriented to person, place, and time.  Psychiatric:        Attention and Perception: Attention normal.        Mood and Affect: Mood is depressed.        Speech: Speech normal.        Behavior: Behavior is cooperative.        Thought  Content: Thought content is not paranoid or delusional. Thought content includes suicidal ideation. Thought content does not include homicidal ideation. Thought content includes suicidal plan. Thought content does not include homicidal plan.        Cognition and Memory: Cognition normal.   Review of Systems  Constitutional:  Negative for chills and fever.  Respiratory:  Negative for shortness of breath.   Cardiovascular:  Negative for chest pain.  Gastrointestinal:  Negative for abdominal pain, nausea and vomiting.  Neurological:  Negative for headaches.  Psychiatric/Behavioral:  Positive for depression and suicidal ideas. Negative for hallucinations and substance abuse. The patient is not nervous/anxious and does not have insomnia.   Blood pressure 117/65, pulse (!) 50, temperature 97.6 F (36.4 C), temperature source Oral, resp. rate 18, height 5\' 3"  (1.6 m), weight 81.2 kg, SpO2 96 %, currently breastfeeding. Body mass index is 31.71 kg/m.  Treatment Plan Summary: Daily contact with patient to assess and evaluate symptoms and progress in treatment, Medication management, and Plan Meets criteria for inpatient psychiatric treatment.   , NP 04/21/2021, 2:01 PM

## 2021-04-21 NOTE — ED Provider Notes (Signed)
Emergency Medicine Observation Re-evaluation Note  Dwight Burdo is a 25 y.o. female, seen on rounds today.  Pt initially presented to the ED for complaints of Suicidal Currently, the patient is resting, asleep.  Physical Exam  BP 117/65 (BP Location: Left Arm)   Pulse (!) 50   Temp 97.6 F (36.4 C) (Oral)   Resp 18   Ht '5\' 3"'  (1.6 m)   Wt 81.2 kg   SpO2 96%   BMI 31.71 kg/m  Physical Exam General: resting, asleep Lungs: even and unlabored Psych: Appears safe, no distress  ED Course / MDM  EKG:   I have reviewed the labs performed to date as well as medications administered while in observation.  Recent changes in the last 24 hours include Per RN documentation, "[employer] has served an order from a [facility] judge to disclose when patient is discharged so she can be taken to jail.  Security has a copy and Carley, RN has seen the document as well." Patient was in restraints overnight, since discontinued because patient no longer violent or agitated.   Per TTS eval overnight, "This Probation officer met with patient this date after she was removed from restraints. Patient contnues to be agitated and renders limited history. Patient contnues to express ongoing S/I when asked in reference to self harm. As this writer attempts to further question patient she pulls the covers over her head and terminates interview. Dolby NP recommends patient continued to be monitored and observed. "  Appears patient home meds were restarted overnight.  Plan  Current plan is for Continue under psychiatric observation, awaiting TTS re-eval.  [name] is not under involuntary commitment.     Regan Lemming, MD 04/21/21 1056

## 2021-04-21 NOTE — ED Provider Notes (Signed)
  Physical Exam  BP 136/86 (BP Location: Left Arm)   Pulse 70   Temp 97.6 F (36.4 C) (Oral)   Resp 20   Ht 5\' 3"  (1.6 m)   Wt 81.2 kg   SpO2 96%   BMI 31.71 kg/m   Physical Exam  ED Course/Procedures     Procedures  MDM  Patient yelling and screaming.  Had been put in seclusion room and had been given Versed.  Reportedly got a piece of metal from the bed attempted to cut herself with that.  Had been hitting her head against the wall.  Will give 10 of Geodon.  Reviewed EKG which was relatively recent did not show QT prolongation all do not have one from this visit.  Did have mention of previous QT prolongation but will give a dose of 10 to be more careful this time.  We will continue to evaluate       , MD 04/21/21 1717

## 2021-04-21 NOTE — ED Notes (Signed)
Patient had been combative and aggressive today. She had to be sedated. Patient is currently sleeping. RN will give medications and obtain vital signs once patient awakens
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
## 2021-04-22 MED ORDER — ACETAMINOPHEN 500 MG PO TABS
1000.0000 mg | ORAL_TABLET | Freq: Once | ORAL | Status: AC
  Administered 2021-04-22: 1000 mg via ORAL
  Filled 2021-04-22: qty 2
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
## 2021-04-22 NOTE — Discharge Instructions (Addendum)
It was our pleasure to provide your ER care today - we hope that you feel better.  For mental health issues and/or crisis, you may go directly to the [facility] - it is open 24/7 and walk-ins are welcome.   Your covid test is positive - see attached covid information.  Also follow up with primary care doctor in the next couple weeks.   Return to ER if worse, new symptoms, increased trouble breathing, or other emergency concern.   Follow up with [facility] at your earliest opportunity:      [facility]      [address]      [place], [place] [address]      [phone]      They also offer psychiatry/medication management and therapy.  New patients are seen in their walk-in clinic.  Walk-in hours are Monday - Thursday from 8:00 am - 11:00 am for psychiatry, and Friday from 1:00 pm - 4:00 pm for therapy.  Walk-in patients are seen on a first come, first served basis, so try to arrive as early as possible for the best chance of being seen the same day.  Please note that to be eligible for services you must bring an ID or a piece of mail with your name and a [facility] address.

## 2021-04-22 NOTE — ED Provider Notes (Signed)
Emergency Medicine Observation Re-evaluation Note  Sharon Ward is a 25 y.o. female, seen on rounds today.  Pt initially presented to the ED for complaints of Suicidal Currently, the patient is resting comfortably.  Physical Exam  BP 114/81 (BP Location: Right Arm)   Pulse 89   Temp 97.6 F (36.4 C) (Oral)   Resp 18   Ht 5\' 3"  (1.6 m)   Wt 81.2 kg   SpO2 99%   BMI 31.71 kg/m  Physical Exam General: NAD  ED Course / MDM  EKG:   I have reviewed the labs performed to date as well as medications administered while in observation.  Recent changes in the last 24 hours include agitation.  Plan  Current plan is for psych evaluation.  Sharon Ward is not under involuntary commitment.     Sharon Glaze, MD 04/22/21 [phone]

## 2021-04-22 NOTE — ED Notes (Signed)
Patient currently laying on mattress on floor in room.  Respirations even and unlabored.  No distressed noted
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
## 2021-04-22 NOTE — ED Notes (Signed)
Pt DC d off unit in police custody . Pt alert and cooperative with DC. Pt ambulatory off unit, escorted and transported by [employer].

## 2021-04-22 NOTE — BH Assessment (Signed)
[facility] Assessment Progress Note   Per Dorena Bodo, NP, this voluntary pt does not require psychiatric hospitalization at this time.  Pt is psychiatrically cleared.  Discharge instructions include referral information for [facility].  Pt is currently under a court order to be turned over to law enforcement custody once she is medically and psychiatrically cleared.  Law enforcement will therefore be contacted.  EDP Kristine Royal, MD and pt's nurse, Waynetta Sandy, have been notified.  Doylene Canning, MA Triage Specialist [phone]

## 2021-04-22 NOTE — ED Notes (Signed)
Patient continues to rest quietly on mattress on floor in room.  Respirations even and unlabored

## 2021-04-26 ENCOUNTER — Encounter (HOSPITAL_COMMUNITY): Payer: Self-pay | Admitting: Student

## 2021-04-26 ENCOUNTER — Other Ambulatory Visit: Payer: Self-pay

## 2021-04-26 ENCOUNTER — Emergency Department (HOSPITAL_COMMUNITY)
Admission: EM | Admit: 2021-04-26 | Discharge: 2021-04-28 | Disposition: A | Discharge status: Home / Self Care | Payer: Medicaid Other | Attending: Emergency Medicine | Admitting: Emergency Medicine

## 2021-04-26 DIAGNOSIS — F17210 Nicotine dependence, cigarettes, uncomplicated: Secondary | ICD-10-CM | POA: Diagnosis not present

## 2021-04-26 DIAGNOSIS — U071 COVID-19: Secondary | ICD-10-CM | POA: Diagnosis not present

## 2021-04-26 DIAGNOSIS — F332 Major depressive disorder, recurrent severe without psychotic features: Secondary | ICD-10-CM | POA: Insufficient documentation

## 2021-04-26 DIAGNOSIS — J45909 Unspecified asthma, uncomplicated: Secondary | ICD-10-CM | POA: Insufficient documentation

## 2021-04-26 DIAGNOSIS — T1491XA Suicide attempt, initial encounter: Secondary | ICD-10-CM

## 2021-04-26 DIAGNOSIS — T43202A Poisoning by unspecified antidepressants, intentional self-harm, initial encounter: Secondary | ICD-10-CM | POA: Insufficient documentation

## 2021-04-26 LAB — CBC WITH DIFFERENTIAL/PLATELET
Abs Immature Granulocytes: 0.02 10*3/uL (ref 0.00–0.07)
Basophils Absolute: 0 10*3/uL (ref 0.0–0.1)
Basophils Relative: 0 %
Eosinophils Absolute: 0 10*3/uL (ref 0.0–0.5)
Eosinophils Relative: 0 %
HCT: 37.9 % (ref 36.0–46.0)
Hemoglobin: 13.3 g/dL (ref 12.0–15.0)
Immature Granulocytes: 0 %
Lymphocytes Relative: 21 %
Lymphs Abs: 1.4 10*3/uL (ref 0.7–4.0)
MCH: 32 pg (ref 26.0–34.0)
MCHC: 35.1 g/dL (ref 30.0–36.0)
MCV: 91.3 fL (ref 80.0–100.0)
Monocytes Absolute: 0.6 10*3/uL (ref 0.1–1.0)
Monocytes Relative: 9 %
Neutro Abs: 4.9 10*3/uL (ref 1.7–7.7)
Neutrophils Relative %: 70 %
Platelets: 237 10*3/uL (ref 150–400)
RBC: 4.15 MIL/uL (ref 3.87–5.11)
RDW: 12.4 % (ref 11.5–15.5)
WBC: 7 10*3/uL (ref 4.0–10.5)
nRBC: 0 % (ref 0.0–0.2)

## 2021-04-26 LAB — RESP PANEL BY RT-PCR (FLU A&B, COVID) ARPGX2
Influenza A by PCR: NEGATIVE
Influenza B by PCR: NEGATIVE
SARS Coronavirus 2 by RT PCR: POSITIVE — AB

## 2021-04-26 LAB — SALICYLATE LEVEL: Salicylate Lvl: <7 mg/dL — ABNORMAL LOW (ref 7.0–30.0)

## 2021-04-26 LAB — COMPREHENSIVE METABOLIC PANEL
ALT: 17 U/L (ref 0–44)
AST: 22 U/L (ref 15–41)
Albumin: 4.3 g/dL (ref 3.5–5.0)
Alkaline Phosphatase: 50 U/L (ref 38–126)
Anion gap: 10 (ref 5–15)
BUN: 11 mg/dL (ref 6–20)
CO2: 20 mmol/L — ABNORMAL LOW (ref 22–32)
Calcium: 9.5 mg/dL (ref 8.9–10.3)
Chloride: 110 mmol/L (ref 98–111)
Creatinine, Ser: 0.74 mg/dL (ref 0.44–1.00)
GFR, Estimated: >60 mL/min (ref >60)
Glucose, Bld: 94 mg/dL (ref 70–99)
Potassium: 3.8 mmol/L (ref 3.5–5.1)
Sodium: 140 mmol/L (ref 135–145)
Total Bilirubin: 0.7 mg/dL (ref 0.3–1.2)
Total Protein: 7.5 g/dL (ref 6.5–8.1)

## 2021-04-26 LAB — RAPID URINE DRUG SCREEN, HOSP PERFORMED
Amphetamines: NOT DETECTED
Barbiturates: NOT DETECTED
Benzodiazepines: NOT DETECTED
Cocaine: NOT DETECTED
Opiates: NOT DETECTED
Tetrahydrocannabinol: POSITIVE — AB

## 2021-04-26 LAB — ETHANOL: Alcohol, Ethyl (B): <10 mg/dL (ref <10)

## 2021-04-26 LAB — ACETAMINOPHEN LEVEL: Acetaminophen (Tylenol), Serum: <10 ug/mL — ABNORMAL LOW (ref 10–30)

## 2021-04-26 LAB — I-STAT BETA HCG BLOOD, ED (MC, WL, AP ONLY): I-stat hCG, quantitative: <5 m[IU]/mL (ref <5)

## 2021-04-26 MED ORDER — LORAZEPAM 2 MG/ML IJ SOLN
0.5000 mg | Freq: Once | INTRAMUSCULAR | Status: AC
  Administered 2021-04-26: 0.5 mg via INTRAVENOUS
  Filled 2021-04-26: qty 1

## 2021-04-26 MED ORDER — LACTATED RINGERS IV BOLUS
2000.0000 mL | Freq: Once | INTRAVENOUS | Status: AC
  Administered 2021-04-26: 2000 mL via INTRAVENOUS

## 2021-04-26 MED ORDER — LACTATED RINGERS IV SOLN: INTRAVENOUS | Status: DC

## 2021-04-26 NOTE — ED Notes (Signed)
Spoke with Wilkie Aye, RN with poison control.  For the zoloft they suggested watching for vomiting, drowziness, and prolonged Qtc.  With the Latuda they suggested watching for Parkinson syndrome, if they get bad enough they suggested checking a CK level and giving benzos. Suggested keeping mag and K on the high side if Qtc prolongs. Freida Busman, EDP made aware.

## 2021-04-26 NOTE — ED Notes (Signed)
Dinner tray given

## 2021-04-26 NOTE — ED Notes (Signed)
Manfred Shirts, RN with poison control. They are closing her case at this time.

## 2021-04-26 NOTE — BH Assessment (Signed)
@  1137, requested nursing ([name], RN) and Enrique Sack, [place]) to place the TTS machine in patient's room for her initial TTS assessment.

## 2021-04-26 NOTE — ED Provider Notes (Signed)
[facility]-EMERGENCY DEPT Provider Note   CSN: [id number] Arrival date & time: 04/26/21  0756     History No chief complaint on file.   Sharon Ward is a 25 y.o. female.  25 year old female who presents after intentional overdose of sertraline and Latuda.  Patient just hospitalized for mental illness a few days ago and was discharged to police custody.  She spent several days in jail and was discharged from there a few days ago.  Since that time she has been homeless and has not been eating or drinking appropriately.  Does have history of bipolar disorder and depression.  States she took the pills to end her life.  She called EMS.  On arrival patient slightly hypotensive and given IV fluids.  Blood sugar above 100.      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   Left foot pain 10/04/2020   Anxiety 09/18/2019   Bipolar disorder (HCC) 09/18/2019   Depression 09/18/2019   LGSIL on Pap smear of cervix 09/18/2019   Oppositional defiant disorder 09/18/2019   H/O psychiatric hospitalization 09/18/2019   Seasonal allergies 09/18/2019   Suicidal ideation 09/18/2019   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    Past Surgical History:  Procedure Laterality Date   PILONIDAL CYST / SINUS EXCISION  09/11/2013   PILONIDAL CYST EXCISION  05/17/2014   Pilonidal cystectomy with cleft lip     OB History     Gravida  3   Para  1   Term  1   Preterm      AB      Living  1      SAB       IAB      Ectopic      Multiple  0   Live Births  1           Family History  Problem Relation Age of Onset   Healthy Mother    Healthy Father     Social History   Tobacco Use   Smoking status: Every Day    Packs/day: 0.25    Years: 4.00    Pack years: 1.00    Types: Cigarettes   Smokeless tobacco: Never   Tobacco comments:    only smoke a "couple" of cigarettes when stressed or anxious, socially with friends per [facility] chart  Vaping Use   Vaping Use: Never used  Substance Use Topics   Alcohol use: Not Currently    Comment: occasional prior to pregnancy   Drug use: Not Currently    Types: Marijuana    Comment: reports use 4 or 5 times; last used 05/04/19    Home Medications Prior to Admission medications   Medication Sig Start Date End Date Taking? Authorizing Provider  acetaminophen (TYLENOL) 500 MG tablet Take 500-1,000 mg by mouth every 6 (six) hours as needed for mild pain, fever or headache.    [provider]  ferrous sulfate 325 (65 FE) MG EC tablet Take 1 tablet (325  mg total) by mouth at bedtime. For anemia Patient not taking: No sig reported 04/05/21   Armandina Stammer I, NP  hydrOXYzine (ATARAX/VISTARIL) 25 MG tablet Take 1 tablet (25 mg total) by mouth 3 (three) times daily as needed for anxiety. Patient not taking: No sig reported 04/05/21   Armandina Stammer I, NP  lurasidone (LATUDA) 20 MG TABS tablet Take 3 tablets (60 mg total) by mouth daily after supper. For mood control 04/05/21   Armandina Stammer I, NP  Prenatal Vit-Fe Fumarate-FA (MULTIVITAMIN-PRENATAL) 27-0.8 MG TABS tablet Take 1 tablet by mouth at bedtime. Patient not taking: No sig reported    [provider]  Pseudoeph-Doxylamine-DM-APAP (NYQUIL MULTI-SYMPTOM PO) Take 30 mLs by mouth every 6 (six) hours as needed (for flu-like symptoms).    [provider]  sertraline (ZOLOFT) 100 MG tablet Take 1 tablet (100 mg total) by mouth at bedtime. For depression 04/05/21 05/05/21  Armandina Stammer I, NP  SPRINTEC 28 0.25-35 MG-MCG tablet Take 1 tablet by mouth daily. 04/05/21   Armandina Stammer I, NP  traZODone (DESYREL) 50 MG tablet Take 1 tablet (50 mg total) by mouth at bedtime as needed for sleep. Patient not taking: No sig reported 04/05/21   Sanjuana Kava, NP    Allergies    Ascorbate, Citrus, Coconut flavor, Lamotrigine, Orange (diagnostic), Peach flavor, Pear, and Pineapple  Review of Systems   Review of Systems  All other systems reviewed and are negative.  Physical Exam Updated Vital Signs There were no vitals taken for this visit.  Physical Exam Vitals and nursing note reviewed.  Constitutional:      General: She is not in acute distress.    Appearance: Normal appearance. She is well-developed. She is not toxic-appearing.  HENT:     Head: Normocephalic and atraumatic.  Eyes:     General: Lids are normal.     Conjunctiva/sclera: Conjunctivae normal.     Pupils: Pupils are equal, round, and reactive to light.  Neck:     Thyroid: No thyroid mass.     Trachea: No tracheal deviation.  Cardiovascular:     Rate and Rhythm: Normal rate and regular rhythm.     Heart sounds: Normal heart sounds. No murmur heard.   No gallop.  Pulmonary:     Effort: Pulmonary effort is normal. No respiratory distress.     Breath sounds: Normal breath sounds. No stridor. No decreased breath sounds, wheezing, rhonchi or rales.  Abdominal:     General: There is no distension.     Palpations: Abdomen is soft.     Tenderness: There is no abdominal tenderness. There is no rebound.  Musculoskeletal:        General: No tenderness. Normal range of motion.     Cervical back: Normal range of motion and neck supple.  Skin:    General: Skin is warm and dry.     Findings: No abrasion or rash.  Neurological:     Mental Status: She is alert and oriented to person, place, and time. Mental status is at baseline.     GCS: GCS eye subscore is 4. GCS verbal subscore is 5. GCS motor subscore is 6.      Cranial Nerves: Cranial nerves are intact. No cranial nerve deficit.     Sensory: No sensory deficit.     Motor: Motor function is intact.  Psychiatric:        Attention and Perception: Attention normal.        Mood  and Affect: Affect is blunt, flat and tearful.        Speech: Speech normal.        Behavior: Behavior is slowed and withdrawn.        Thought Content: Thought content includes suicidal ideation. Thought content includes suicidal plan.    ED Results / Procedures / Treatments   Labs (all labs ordered are listed, but only abnormal results are displayed) Labs Reviewed  ETHANOL  RAPID URINE DRUG SCREEN, HOSP PERFORMED  SALICYLATE LEVEL  ACETAMINOPHEN LEVEL  CBC WITH DIFFERENTIAL/PLATELET  COMPREHENSIVE METABOLIC PANEL  I-STAT BETA HCG BLOOD, ED ([facility], [facility], [facility] ONLY)    EKG None  Radiology No results found.  Procedures Procedures   Medications Ordered in ED Medications  lactated ringers bolus 2,000 mL (has no administration in time range)  lactated ringers infusion (has no administration in time range)    ED Course  I have reviewed the triage vital signs and the nursing notes.  Pertinent labs & imaging results that were available during my care of the patient were reviewed by me and considered in my medical decision making (see chart for details).    MDM Rules/Calculators/A&P                           Patient did have some shakes and was given Ativan and feels better.  States she has not slept in the past 3 days.  Did get some rest and feels better at this time.  Labs reviewed and she is now medically clear for psychiatric hospitalization Final Clinical Impression(s) / ED Diagnoses Final diagnoses:  None    Rx / DC Orders ED Discharge Orders     None        Lorre Nick, MD 04/26/21 1302
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
## 2021-04-26 NOTE — ED Triage Notes (Signed)
Patient BIB [facility] after an attempted overdose.  Patient took about 78 Zoloft and 6 latuda around 6 AM. Patient was just here last week for SI.  She tested positive for COVID 04/20/2021.  EMS placed an 18g left AC and patient received 400cc NS.  Patient can talk and is having some shaking going on with so blurry vision. Pupils are slurrish.  104/54 80-120 HR 111-CBG 98% on room air

## 2021-04-26 NOTE — BH Assessment (Addendum)
@  1146, contacted nursing secretary requesting assistance with placing the TTS machine in patient's room. No answer.   @1147 , sent another secure chat to patient's nurse and NT. Attempts made to complete patient's TTS assessment. Nursing to contact TTS when patient is available to be seen .

## 2021-04-27 MED ORDER — ACETAMINOPHEN 500 MG PO TABS: 500.0000 mg | ORAL_TABLET | Freq: Four times a day (QID) | ORAL | Status: DC | PRN

## 2021-04-27 MED ORDER — FERROUS SULFATE 325 (65 FE) MG PO TABS
325.0000 mg | ORAL_TABLET | Freq: Every day | ORAL | Status: DC
Start: 2021-04-27 — End: 2021-04-28
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
## 2021-04-27 NOTE — BH Assessment (Signed)
Disposition:   Per Malachy Chamber, DNP, patient may be discharged and/or psych cleared only if patient has an appointment to start PHP. Milana Na, LCSW with the PHP program has been notified as of 1620 via secure chat of patients appointment needs to start PHP. Awaiting a response back from [place]. Otherwise inpatient psychiatric treatment is recommended. Patient is COVID +.
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
## 2021-04-27 NOTE — ED Notes (Signed)
Patient given hygiene products and taken to TCU area for shower. Patient assisted with shower.

## 2021-04-28 NOTE — ED Provider Notes (Signed)
Emergency Medicine Observation Re-evaluation Note  Sharon Ward is a 25 y.o. female, seen on rounds today.  Pt initially presented to the ED for complaints of Ingestion and Suicide Attempt Currently, the patient is awaiting inpatient psychiatric admission.  Per counselor, she can possibly be discharged if she has an appointment to start PHP.  Physical Exam  BP (!) 98/54 (BP Location: Left Arm)   Pulse 60   Temp 97.6 F (36.4 C) (Oral)   Resp 16   Ht 5\' 3"  (1.6 m)   Wt 81 kg   SpO2 99%   BMI 31.63 kg/m  Physical Exam General: Awake, alert, well-appearing Cardiac: Well perfused Lungs: Breathing even and unlabored Psych: Denying current agitation, SI, AVH  ED Course / MDM  EKG:EKG Interpretation  Date/Time:  Sunday April 26 2021 10:16:50 EDT Ventricular Rate:  86 PR Interval:  157 QRS Duration: 97 QT Interval:  410 QTC Calculation: 491 R Axis:   56 Text Interpretation: Sinus rhythm RSR' in V1 or V2, probably normal variant Borderline prolonged QT interval Confirmed by 12-04-1986 [phone]) on 04/27/2021 10:30:08 AM  I have reviewed the labs performed to date as well as medications administered while in observation.  Recent changes in the last 24 hours include: Attempts being made to get her enrolled in PHP/IOP, otherwise inpatient psychiatric admission is recommended.  Psychiatry rounded on this patient and in the morning.  They provided follow-up instructions for PHP.  They cleared her from a psychiatric standpoint.  Patient was discharged in stable condition.  Plan  Patient discharged.  [facility] is not under involuntary commitment.     Durwin Glaze, MD 04/28/21 1054

## 2021-04-28 NOTE — Discharge Instructions (Signed)
For your behavioral health needs, you are advised to follow up with the Partial Hospitalization Program (PHP) at the [facility] at [place].  This program meets Monday - Friday from 9:00 am - 1:00 pm.  Due to Covid-19 this program is currently virtual.  You are scheduled for a virtual intake appointment on Wednesday, May 06, 2021 at 2:00 pm.  If you have any questions, contact Milana Na, [facility] at the phone number indicated below:       [facility] [employer]. [address] [place] [address]      Contact person: Milana Na, [facility]      [phone]

## 2021-04-28 NOTE — BH Assessment (Signed)
[facility] Assessment Progress Note   Per Caryn Bee, NP, this voluntary pt does not require psychiatric hospitalization at this time.  Pt is psychiatrically cleared.  Pt would benefit from Partial Hospitalization Program at the [facility], which is currently being offered virtually due to Covid-19.  This Clinical research associate has spoken to pt and she would like to enroll; she adds that she has necessary IT support to participate in virtual programming.  I have contacted Milana Na, [facility], who has scheduled pt for intake on Wednesday, 05/06/2021  at 14:00 This has been included in pt's discharge instructions.  I have obtained pt's best phone number and email address which I have sent to [facility] via email.  EDP Gloris Manchester, MD and pt's nurse, Sherrilyn Rist, have been notified.  Doylene Canning, MA Triage Specialist [phone]

## 2021-04-28 NOTE — ED Notes (Signed)
Explained to pt that PHP needed to be confirmed prior to pt being discharged.  Emotional support provided to pt. Pt verbalized understanding.

## 2021-04-29 ENCOUNTER — Ambulatory Visit (INDEPENDENT_AMBULATORY_CARE_PROVIDER_SITE_OTHER): Payer: Medicaid Other | Admitting: Family Medicine

## 2021-04-29 ENCOUNTER — Other Ambulatory Visit (HOSPITAL_COMMUNITY)
Admission: RE | Admit: 2021-04-29 | Discharge: 2021-04-29 | Disposition: A | Discharge status: Home / Self Care | Payer: Medicaid Other | Source: Ambulatory Visit | Attending: Family Medicine | Admitting: Family Medicine

## 2021-04-29 ENCOUNTER — Other Ambulatory Visit: Payer: Self-pay

## 2021-04-29 VITALS — BP 100/44 | HR 68 | Ht 63.0 in | Wt 180.9 lb

## 2021-04-29 DIAGNOSIS — N898 Other specified noninflammatory disorders of vagina: Secondary | ICD-10-CM | POA: Diagnosis present

## 2021-04-29 DIAGNOSIS — N941 Unspecified dyspareunia: Secondary | ICD-10-CM | POA: Diagnosis not present

## 2021-04-29 DIAGNOSIS — R102 Pelvic and perineal pain: Secondary | ICD-10-CM | POA: Insufficient documentation

## 2021-04-29 MED ORDER — LIDOCAINE HCL 4 % EX GEL: 1.0000 "application " | Freq: Every day | CUTANEOUS | 3 refills | Status: DC | PRN

## 2021-04-29 NOTE — Progress Notes (Signed)
   Subjective:    Patient ID: Sharon Ward, female    DOB: Oct 11, 1995, 25 y.o.   MRN: [id number]  HPI Patient seen for pain during sex for the past 3-4 months. Feels a lump at the introitus that is tender - about the size of a marble. No palliating factors. Sex increases the pain. Also having discharge - green clumpy discharge.   Review of Systems     Objective:   Physical Exam Vitals reviewed. Exam conducted with a chaperone present.  Constitutional:      Appearance: Normal appearance.  Abdominal:     Hernia: There is no hernia in the left inguinal area or right inguinal area.  Genitourinary:    General: Normal vulva.     Labia:        Right: No rash or tenderness.        Left: No rash or tenderness.      Urethra: No prolapse, urethral pain, urethral swelling or urethral lesion.    Lymphadenopathy:     Lower Body: No right inguinal adenopathy. No left inguinal adenopathy.  Neurological:     Mental Status: She is alert.  Psychiatric:        Mood and Affect: Mood normal.        Behavior: Behavior normal.        Thought Content: Thought content normal.        Judgment: Judgment normal.      Assessment & Plan:  1. Dyspareunia in female Referred to pelvic floor PT. Try lidocaine gel. Will look for and treat any underlying vaginal infection.  - Cervicovaginal ancillary only( [place]) - Ambulatory referral to Physical Therapy  2. Vaginal discharge - Cervicovaginal ancillary only( [place])

## 2021-04-30 LAB — CERVICOVAGINAL ANCILLARY ONLY
Bacterial Vaginitis (gardnerella): POSITIVE — AB
Candida Glabrata: NEGATIVE
Candida Vaginitis: NEGATIVE
Chlamydia: NEGATIVE
Comment: NEGATIVE
Comment: NEGATIVE
Comment: NEGATIVE
Comment: NEGATIVE
Comment: NEGATIVE
Comment: NORMAL
Neisseria Gonorrhea: NEGATIVE
Trichomonas: NEGATIVE

## 2021-05-01 MED ORDER — METRONIDAZOLE 500 MG PO TABS: 500.0000 mg | ORAL_TABLET | Freq: Two times a day (BID) | ORAL | 0 refills | Status: DC

## 2021-05-01 NOTE — Addendum Note (Signed)
Addended by: Levie Heritage on: 05/01/2021 10:16 AM   Modules accepted: Orders

## 2021-05-03 ENCOUNTER — Encounter (HOSPITAL_COMMUNITY): Payer: Self-pay | Admitting: Oncology

## 2021-05-03 ENCOUNTER — Other Ambulatory Visit: Payer: Self-pay

## 2021-05-03 ENCOUNTER — Emergency Department (HOSPITAL_COMMUNITY)
Admission: EM | Admit: 2021-05-03 | Discharge: 2021-05-04 | Disposition: A | Discharge status: Other Acute Inpatient Hospital | Payer: Medicaid Other | Source: Home / Self Care | Attending: Emergency Medicine | Admitting: Emergency Medicine

## 2021-05-03 DIAGNOSIS — T43592A Poisoning by other antipsychotics and neuroleptics, intentional self-harm, initial encounter: Secondary | ICD-10-CM | POA: Insufficient documentation

## 2021-05-03 DIAGNOSIS — N9489 Other specified conditions associated with female genital organs and menstrual cycle: Secondary | ICD-10-CM | POA: Insufficient documentation

## 2021-05-03 DIAGNOSIS — T43222A Poisoning by selective serotonin reuptake inhibitors, intentional self-harm, initial encounter: Secondary | ICD-10-CM | POA: Insufficient documentation

## 2021-05-03 DIAGNOSIS — F319 Bipolar disorder, unspecified: Secondary | ICD-10-CM

## 2021-05-03 DIAGNOSIS — T50902A Poisoning by unspecified drugs, medicaments and biological substances, intentional self-harm, initial encounter: Secondary | ICD-10-CM

## 2021-05-03 DIAGNOSIS — F17210 Nicotine dependence, cigarettes, uncomplicated: Secondary | ICD-10-CM | POA: Insufficient documentation

## 2021-05-03 DIAGNOSIS — J45909 Unspecified asthma, uncomplicated: Secondary | ICD-10-CM | POA: Insufficient documentation

## 2021-05-03 DIAGNOSIS — Y900 Blood alcohol level of less than 20 mg/100 ml: Secondary | ICD-10-CM | POA: Insufficient documentation

## 2021-05-03 DIAGNOSIS — F332 Major depressive disorder, recurrent severe without psychotic features: Secondary | ICD-10-CM

## 2021-05-03 DIAGNOSIS — Z79899 Other long term (current) drug therapy: Secondary | ICD-10-CM | POA: Insufficient documentation

## 2021-05-03 LAB — CBC
HCT: 41.3 % (ref 36.0–46.0)
Hemoglobin: 13.8 g/dL (ref 12.0–15.0)
MCH: 31.8 pg (ref 26.0–34.0)
MCHC: 33.4 g/dL (ref 30.0–36.0)
MCV: 95.2 fL (ref 80.0–100.0)
Platelets: 223 10*3/uL (ref 150–400)
RBC: 4.34 MIL/uL (ref 3.87–5.11)
RDW: 12.7 % (ref 11.5–15.5)
WBC: 9.2 10*3/uL (ref 4.0–10.5)
nRBC: 0 % (ref 0.0–0.2)

## 2021-05-03 LAB — I-STAT BETA HCG BLOOD, ED (MC, WL, AP ONLY): I-stat hCG, quantitative: <5 m[IU]/mL (ref <5)

## 2021-05-03 LAB — ACETAMINOPHEN LEVEL
Acetaminophen (Tylenol), Serum: <10 ug/mL — ABNORMAL LOW (ref 10–30)
Acetaminophen (Tylenol), Serum: <10 ug/mL — ABNORMAL LOW (ref 10–30)

## 2021-05-03 LAB — COMPREHENSIVE METABOLIC PANEL
ALT: 13 U/L (ref 0–44)
AST: 16 U/L (ref 15–41)
Albumin: 4.3 g/dL (ref 3.5–5.0)
Alkaline Phosphatase: 54 U/L (ref 38–126)
Anion gap: 6 (ref 5–15)
BUN: 10 mg/dL (ref 6–20)
CO2: 24 mmol/L (ref 22–32)
Calcium: 9.3 mg/dL (ref 8.9–10.3)
Chloride: 110 mmol/L (ref 98–111)
Creatinine, Ser: 0.83 mg/dL (ref 0.44–1.00)
GFR, Estimated: >60 mL/min (ref >60)
Glucose, Bld: 89 mg/dL (ref 70–99)
Potassium: 3.8 mmol/L (ref 3.5–5.1)
Sodium: 140 mmol/L (ref 135–145)
Total Bilirubin: 0.5 mg/dL (ref 0.3–1.2)
Total Protein: 7.3 g/dL (ref 6.5–8.1)

## 2021-05-03 LAB — ETHANOL: Alcohol, Ethyl (B): <10 mg/dL (ref <10)

## 2021-05-03 LAB — SALICYLATE LEVEL: Salicylate Lvl: <7 mg/dL — ABNORMAL LOW (ref 7.0–30.0)

## 2021-05-03 LAB — RAPID URINE DRUG SCREEN, HOSP PERFORMED
Amphetamines: NOT DETECTED
Barbiturates: NOT DETECTED
Benzodiazepines: NOT DETECTED
Cocaine: NOT DETECTED
Opiates: NOT DETECTED
Tetrahydrocannabinol: POSITIVE — AB

## 2021-05-03 LAB — MAGNESIUM: Magnesium: 2.1 mg/dL (ref 1.7–2.4)

## 2021-05-03 LAB — CBG MONITORING, ED: Glucose-Capillary: 84 mg/dL (ref 70–99)

## 2021-05-03 MED ORDER — NICOTINE 21 MG/24HR TD PT24: 21.0000 mg | MEDICATED_PATCH | Freq: Every day | TRANSDERMAL | Status: DC

## 2021-05-03 MED ORDER — ACETAMINOPHEN 325 MG PO TABS: 650.0000 mg | ORAL_TABLET | ORAL | Status: DC | PRN

## 2021-05-03 NOTE — ED Notes (Signed)
Poison Control released patient, no further testing required
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
## 2021-05-03 NOTE — ED Provider Notes (Signed)
[facility] Note   CSN: [id number] Arrival date & time: 05/03/21  1345     History No chief complaint on file.   Sharon Ward is a 25 y.o. female.  [name] she made a suicide attempt today after having an argument with her boyfriend.  She ingested 30 60 mg tablets of Latuda and 15 100 mg tablets of sertraline.   Recent ED stay for multiple days due to mental health concerns.  Ultimately discharged home with intent to undergo intensive outpatient treatment.  The history is provided by the patient.  Drug Overdose This is a new problem. The current episode started 1 to 2 hours ago. The problem occurs constantly. The problem has been resolved. Pertinent negatives include no chest pain, no abdominal pain, no headaches and no shortness of breath. Nothing aggravates the symptoms. Nothing relieves the symptoms. She has tried nothing for the symptoms. The treatment provided no relief.      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   Left foot pain 10/04/2020   Anxiety 09/18/2019   Bipolar disorder (HCC) 09/18/2019   Depression 09/18/2019   LGSIL on Pap smear of cervix 09/18/2019   Oppositional defiant disorder 09/18/2019   H/O psychiatric hospitalization 09/18/2019   Seasonal allergies 09/18/2019   Suicidal ideation 09/18/2019   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    Past Surgical History:  Procedure Laterality Date   PILONIDAL CYST / SINUS EXCISION   09/11/2013   PILONIDAL CYST EXCISION  05/17/2014   Pilonidal cystectomy with cleft lip     OB History     Gravida  3   Para  1   Term  1   Preterm      AB      Living  1      SAB      IAB      Ectopic      Multiple  0   Live Births  1           Family History  Problem Relation Age of Onset   Healthy Mother    Healthy Father     Social History   Tobacco Use   Smoking status: Every Day    Packs/day: 0.25    Years: 4.00    Pack years: 1.00    Types: Cigarettes   Smokeless tobacco: Never   Tobacco comments:    only smoke a "couple" of cigarettes when stressed or anxious, socially with friends per [facility] chart  Vaping Use   Vaping Use: Never used  Substance Use Topics   Alcohol use: Not Currently    Comment: occasional prior to pregnancy   Drug use: Not Currently    Types: Marijuana    Comment: reports use 4 or 5 times; last used 05/04/19    Home Medications Prior to Admission medications   Medication Sig Start Date End Date Taking? Authorizing Provider  ferrous sulfate 325 (65 FE) MG EC tablet Take 1 tablet (325 mg total)  by mouth at bedtime. For anemia 04/05/21   Armandina Stammer I, NP  hydrOXYzine (ATARAX/VISTARIL) 25 MG tablet Take 1 tablet (25 mg total) by mouth 3 (three) times daily as needed for anxiety. Patient not taking: No sig reported 04/05/21   Armandina Stammer I, NP  Lidocaine HCl 4 % GEL Apply 1 application topically daily as needed. 04/29/21   Levie Heritage, DO  lurasidone (LATUDA) 20 MG TABS tablet Take 3 tablets (60 mg total) by mouth daily after supper. For mood control 04/05/21   Armandina Stammer I, NP  metroNIDAZOLE (FLAGYL) 500 MG tablet Take 1 tablet (500 mg total) by mouth 2 (two) times daily. 05/01/21   Levie Heritage, DO  Prenatal Vit-Fe Fumarate-FA (MULTIVITAMIN-PRENATAL) 27-0.8 MG TABS tablet Take 1 tablet by mouth at bedtime.    [provider]  sertraline (ZOLOFT) 100 MG tablet Take 1 tablet (100 mg total) by mouth at bedtime.  For depression 04/05/21 05/05/21  Armandina Stammer I, NP  SPRINTEC 28 0.25-35 MG-MCG tablet Take 1 tablet by mouth daily. 04/05/21   Sanjuana Kava, NP    Allergies    Ascorbate, Citrus, Coconut flavor, Lamotrigine, Orange (diagnostic), Peach flavor, Pear, and Pineapple  Review of Systems   Review of Systems  Constitutional:  Positive for fatigue. Negative for chills and fever.  HENT:  Negative for ear pain and sore throat.   Eyes:  Negative for pain and visual disturbance.  Respiratory:  Negative for cough and shortness of breath.   Cardiovascular:  Negative for chest pain and palpitations.  Gastrointestinal:  Negative for abdominal pain and vomiting.  Genitourinary:  Negative for dysuria and hematuria.  Musculoskeletal:  Negative for arthralgias and back pain.  Skin:  Negative for color change and rash.  Neurological:  Negative for seizures, syncope and headaches.  Psychiatric/Behavioral:  Positive for suicidal ideas.   All other systems reviewed and are negative.  Physical Exam Updated Vital Signs BP 119/60 (BP Location: Right Arm)   Pulse (!) 58   Temp 98.1 F (36.7 C) (Oral)   Resp 19   Ht 5\' 7"  (1.702 m)   Wt 81.6 kg   LMP 04/19/2021 (Approximate)   SpO2 99%   BMI 28.19 kg/m   Physical Exam Vitals and nursing note reviewed.  Constitutional:      Appearance: She is well-developed.  HENT:     Head: Normocephalic and atraumatic.  Cardiovascular:     Rate and Rhythm: Normal rate and regular rhythm.     Heart sounds: Normal heart sounds.  Pulmonary:     Effort: Pulmonary effort is normal. No tachypnea.     Breath sounds: Normal breath sounds.  Abdominal:     Palpations: Abdomen is soft.     Tenderness: There is no abdominal tenderness.  Musculoskeletal:     Right lower leg: No edema.     Left lower leg: No edema.  Skin:    General: Skin is warm and dry.  Neurological:     General: No focal deficit present.     Mental Status: She is alert and oriented to person,  place, and time.  Psychiatric:        Attention and Perception: Attention and perception normal.        Mood and Affect: Mood is depressed. Affect is flat.        Speech: Speech normal.        Behavior: Behavior is cooperative.        Thought Content: Thought content  normal.        Cognition and Memory: Cognition normal.        Judgment: Judgment normal.    ED Results / Procedures / Treatments   Labs (all labs ordered are listed, but only abnormal results are displayed) Labs Reviewed  SALICYLATE LEVEL - Abnormal; Notable for the following components:      Result Value   Salicylate Lvl <7.0 (*)    All other components within normal limits  ACETAMINOPHEN LEVEL - Abnormal; Notable for the following components:   Acetaminophen (Tylenol), Serum <10 (*)    All other components within normal limits  ACETAMINOPHEN LEVEL - Abnormal; Notable for the following components:   Acetaminophen (Tylenol), Serum <10 (*)    All other components within normal limits  COMPREHENSIVE METABOLIC PANEL  ETHANOL  CBC  MAGNESIUM  RAPID URINE DRUG SCREEN, HOSP PERFORMED  CBG MONITORING, ED  I-STAT BETA HCG BLOOD, ED ([facility], [facility], [facility] ONLY)    EKG EKG Interpretation  Date/Time:  Sunday May 03 2021 14:09:10 EDT Ventricular Rate:  61 PR Interval:  153 QRS Duration: 97 QT Interval:  445 QTC Calculation: 449 R Axis:   34 Text Interpretation: Sinus rhythm normal axis normal QTc No acute ischemia Confirmed by Pieter Partridge ([id number]) on 05/03/2021 2:56:35 PM  Radiology No results found.  Procedures Procedures   Medications Ordered in ED Medications - No data to display  ED Course  I have reviewed the triage vital signs and the nursing notes.  Pertinent labs & imaging results that were available during my care of the patient were reviewed by me and considered in my medical decision making (see chart for details).  Clinical Course as of 05/03/21 2118  Wynelle Link May 03, 2021  2116 Medically clear for TTS  dispo  [AW]    Clinical Course User Index [AW] Koleen Distance, MD   MDM Rules/Calculators/A&P                           [name] resents after an intentional overdose.  She will be placed under involuntary commitment.  Poison control has been called, and she will be observed medically x6 hours with cardiac monitoring.  We will evaluate her QTc interval, electrolytes, and check for other ingestions.  She has done well during her ED observation period.  Medically clear for psychiatric dispo. Final Clinical Impression(s) / ED Diagnoses Final diagnoses:  Suicide attempt by drug ingestion, initial encounter (HCC)  Bipolar depression (HCC)  Major depressive disorder, recurrent severe without psychotic features [facility])    Rx / DC Orders ED Discharge Orders     None        Koleen Distance, MD 05/03/21 2118

## 2021-05-03 NOTE — ED Triage Notes (Signed)
Pt bib [facility] d/t ingestion of 30 60 mg Latuda as well as 15 100 mg sertraline in suicide attempt.

## 2021-05-03 NOTE — ED Notes (Signed)
Poison control notified of ingestion.  Per PC, look for CNS depression, tachycardia, Hypo/hypertension, QTC prolongation.  Recommendations; 12 lead, cardiac monitoring, basic labs, tylenol level at 1700, If QTC>538ms optimize magnesium and potassium to high normal, Use fluids and benzodiazepines as needed.

## 2021-05-04 ENCOUNTER — Encounter (HOSPITAL_COMMUNITY): Payer: Self-pay | Admitting: Nurse Practitioner

## 2021-05-04 ENCOUNTER — Inpatient Hospital Stay (HOSPITAL_COMMUNITY)
Admission: AD | Admit: 2021-05-04 | Discharge: 2021-05-11 | DRG: 885 | Disposition: A | Discharge status: Home / Self Care | Payer: Medicaid Other | Source: Intra-hospital | Attending: Emergency Medicine | Admitting: Emergency Medicine

## 2021-05-04 ENCOUNTER — Other Ambulatory Visit: Payer: Self-pay

## 2021-05-04 DIAGNOSIS — K30 Functional dyspepsia: Secondary | ICD-10-CM | POA: Diagnosis not present

## 2021-05-04 DIAGNOSIS — Z79899 Other long term (current) drug therapy: Secondary | ICD-10-CM | POA: Diagnosis not present

## 2021-05-04 DIAGNOSIS — R9431 Abnormal electrocardiogram [ECG] [EKG]: Secondary | ICD-10-CM | POA: Diagnosis not present

## 2021-05-04 DIAGNOSIS — K5900 Constipation, unspecified: Secondary | ICD-10-CM | POA: Diagnosis not present

## 2021-05-04 DIAGNOSIS — F4310 Post-traumatic stress disorder, unspecified: Secondary | ICD-10-CM | POA: Diagnosis present

## 2021-05-04 DIAGNOSIS — F3130 Bipolar disorder, current episode depressed, mild or moderate severity, unspecified: Secondary | ICD-10-CM | POA: Diagnosis not present

## 2021-05-04 DIAGNOSIS — T43592A Poisoning by other antipsychotics and neuroleptics, intentional self-harm, initial encounter: Secondary | ICD-10-CM | POA: Diagnosis not present

## 2021-05-04 DIAGNOSIS — F1290 Cannabis use, unspecified, uncomplicated: Secondary | ICD-10-CM

## 2021-05-04 DIAGNOSIS — T43012A Poisoning by tricyclic antidepressants, intentional self-harm, initial encounter: Secondary | ICD-10-CM | POA: Diagnosis not present

## 2021-05-04 DIAGNOSIS — F3181 Bipolar II disorder: Secondary | ICD-10-CM | POA: Diagnosis not present

## 2021-05-04 DIAGNOSIS — Z9151 Personal history of suicidal behavior: Secondary | ICD-10-CM | POA: Diagnosis present

## 2021-05-04 DIAGNOSIS — F411 Generalized anxiety disorder: Secondary | ICD-10-CM | POA: Diagnosis present

## 2021-05-04 DIAGNOSIS — T1491XA Suicide attempt, initial encounter: Secondary | ICD-10-CM | POA: Diagnosis present

## 2021-05-04 DIAGNOSIS — F419 Anxiety disorder, unspecified: Secondary | ICD-10-CM | POA: Diagnosis present

## 2021-05-04 DIAGNOSIS — F17210 Nicotine dependence, cigarettes, uncomplicated: Secondary | ICD-10-CM | POA: Diagnosis not present

## 2021-05-04 DIAGNOSIS — F4312 Post-traumatic stress disorder, chronic: Secondary | ICD-10-CM | POA: Diagnosis present

## 2021-05-04 DIAGNOSIS — F1210 Cannabis abuse, uncomplicated: Secondary | ICD-10-CM

## 2021-05-04 DIAGNOSIS — F603 Borderline personality disorder: Secondary | ICD-10-CM | POA: Diagnosis present

## 2021-05-04 DIAGNOSIS — Z7251 High risk heterosexual behavior: Secondary | ICD-10-CM

## 2021-05-04 MED ORDER — OLANZAPINE 10 MG PO TBDP
10.0000 mg | ORAL_TABLET | Freq: Three times a day (TID) | ORAL | Status: DC | PRN
  Administered 2021-05-05: 10 mg via ORAL
  Filled 2021-05-04: qty 1

## 2021-05-04 MED ORDER — ALUM & MAG HYDROXIDE-SIMETH 200-200-20 MG/5ML PO SUSP: 30.0000 mL | ORAL | Status: DC | PRN

## 2021-05-04 MED ORDER — DIPHENHYDRAMINE HCL 50 MG/ML IJ SOLN
50.0000 mg | Freq: Once | INTRAMUSCULAR | Status: AC
  Administered 2021-05-04: 50 mg via INTRAMUSCULAR
  Filled 2021-05-04: qty 1

## 2021-05-04 MED ORDER — ZIPRASIDONE MESYLATE 20 MG IM SOLR
INTRAMUSCULAR | Status: AC
  Administered 2021-05-04: 20 mg via INTRAMUSCULAR
  Filled 2021-05-04: qty 20

## 2021-05-04 MED ORDER — TRAZODONE HCL 50 MG PO TABS: 50.0000 mg | ORAL_TABLET | Freq: Every evening | ORAL | Status: DC | PRN

## 2021-05-04 MED ORDER — HYDROXYZINE HCL 25 MG PO TABS
25.0000 mg | ORAL_TABLET | Freq: Four times a day (QID) | ORAL | Status: DC | PRN
  Administered 2021-05-04: 25 mg via ORAL
  Filled 2021-05-04: qty 1

## 2021-05-04 MED ORDER — LORAZEPAM 2 MG/ML IJ SOLN
INTRAMUSCULAR | Status: AC
  Administered 2021-05-04: 2 mg via INTRAMUSCULAR
  Filled 2021-05-04: qty 1

## 2021-05-04 MED ORDER — ACETAMINOPHEN 325 MG PO TABS
650.0000 mg | ORAL_TABLET | Freq: Four times a day (QID) | ORAL | Status: DC | PRN
  Administered 2021-05-06: 650 mg via ORAL
  Filled 2021-05-04: qty 2

## 2021-05-04 MED ORDER — HYDROXYZINE HCL 25 MG PO TABS
25.0000 mg | ORAL_TABLET | Freq: Three times a day (TID) | ORAL | Status: DC | PRN
  Administered 2021-05-06 – 2021-05-08 (×3): 25 mg via ORAL
  Filled 2021-05-04 (×3): qty 1

## 2021-05-04 MED ORDER — NICOTINE 21 MG/24HR TD PT24
21.0000 mg | MEDICATED_PATCH | TRANSDERMAL | Status: DC
  Filled 2021-05-04 (×3): qty 1

## 2021-05-04 MED ORDER — MAGNESIUM HYDROXIDE 400 MG/5ML PO SUSP: 30.0000 mL | Freq: Every day | ORAL | Status: DC | PRN

## 2021-05-04 MED ORDER — DIPHENHYDRAMINE HCL 50 MG/ML IJ SOLN
INTRAMUSCULAR | Status: AC
  Filled 2021-05-04: qty 1

## 2021-05-04 MED ORDER — LORAZEPAM 1 MG PO TABS
2.0000 mg | ORAL_TABLET | ORAL | Status: AC | PRN
Start: 2021-05-04 — End: 2021-05-05
  Administered 2021-05-05: 2 mg via ORAL
  Filled 2021-05-04: qty 2

## 2021-05-04 MED ORDER — TRAZODONE HCL 50 MG PO TABS
50.0000 mg | ORAL_TABLET | Freq: Every evening | ORAL | Status: DC | PRN
Start: 2021-05-04 — End: 2021-05-11
  Administered 2021-05-06 – 2021-05-10 (×3): 50 mg via ORAL
  Filled 2021-05-04 (×4): qty 1

## 2021-05-04 MED ORDER — ZIPRASIDONE MESYLATE 20 MG IM SOLR: 20.0000 mg | INTRAMUSCULAR | Status: DC | PRN

## 2021-05-04 MED ORDER — LORAZEPAM 2 MG/ML IJ SOLN: 2.0000 mg | Freq: Four times a day (QID) | INTRAMUSCULAR | Status: DC | PRN

## 2021-05-04 NOTE — Plan of Care (Signed)
Nurse discussed anxiety, depression and coping skills with patient.  
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
## 2021-05-04 NOTE — Progress Notes (Signed)
Patient ID: Sharon Ward, female   DOB: [birth year], 25 y.o.   MRN: [id number] Patient observed at nursing station yelling and screaming, demanding to leave. Patient is IVC; however, she is under the impression she came her voluntarily. Made several attempts to de-escalate the patient verbally with no success. To keep the milieu safe, patient was placed in the restraint chair after refusing all IM medications. She is currently in her room w/1:1 observation. She is calm and is now dozing off. Informed patient that she may be released once she has calmed down and staff considers her behavior safe for the milieu.
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
## 2021-05-04 NOTE — Progress Notes (Signed)
Pt was released from restraint chair.  She vomited on herself and staff assisted her to the shower.  VSS.  Pt remains on 1:1 with staff by her side.

## 2021-05-04 NOTE — ED Notes (Signed)
Transport called.

## 2021-05-04 NOTE — ED Provider Notes (Signed)
Emergency Medicine Observation Re-evaluation Note  Sharon Ward is a 25 y.o. female, seen on rounds today.  Pt initially presented to the ED for complaints of No chief complaint on file. Currently, the patient is being evaluated for intentional overdose.  Physical Exam  BP 109/61   Pulse 64   Temp 98.1 F (36.7 C) (Oral)   Resp 14   Ht 1.702 m (5\' 7" )   Wt 81.6 kg   LMP 04/19/2021 (Approximate)   SpO2 96%   BMI 28.19 kg/m  Physical Exam General: laying in bed asleep on monitor Cardiac: rrr Lungs: cta Psych: resting,laying on left side appears asleep  ED Course / MDM  EKG:EKG Interpretation  Date/Time:  Sunday May 03 2021 14:09:10 EDT Ventricular Rate:  61 PR Interval:  153 QRS Duration: 97 QT Interval:  445 QTC Calculation: 449 R Axis:   34 Text Interpretation: Sinus rhythm normal axis normal QTc No acute ischemia Confirmed by [id number] ([id number]) on 05/03/2021 2:56:35 PM  I have reviewed the labs performed to date as well as medications administered while in observation.  Recent changes in the last 24 hours include cleared for psychiatric disposition.  Plan  Current plan is for inpatient psychiatric care. Sharon Ward is under involuntary commitment.      Durwin Glaze, MD 05/04/21 [phone]

## 2021-05-04 NOTE — [facility] Group Notes (Signed)
Pt did not attend the AA meeting this evening. Pt was in their room.

## 2021-05-04 NOTE — ED Notes (Signed)
VS and pain scale not preformed prior to departure.  Pt transported to [facility] via [employer].

## 2021-05-04 NOTE — BH Assessment (Signed)
[facility] Assessment Progress Note   Per [name], NP, this pt requires psychiatric hospitalization.  Brook, RN, [facility] has assigned pt to [facility] Rm 301-2.  Pt presents under IVC initiated by EDP Pieter Partridge, MD and IVC documents have been sent  to [facility]  EDP Margarita Grizzle, MD and pt's nurse, Nash Dimmer, have been notified, and Nash Dimmer agrees to call report to [phone].  Pt is to be transported via [employer].   Doylene Canning, [place] [facility] Coordinator [phone]

## 2021-05-05 DIAGNOSIS — F1290 Cannabis use, unspecified, uncomplicated: Secondary | ICD-10-CM

## 2021-05-05 DIAGNOSIS — F1210 Cannabis abuse, uncomplicated: Secondary | ICD-10-CM

## 2021-05-05 HISTORY — DX: Cannabis use, unspecified, uncomplicated: F12.90

## 2021-05-05 MED ORDER — NICOTINE 21 MG/24HR TD PT24
21.0000 mg | MEDICATED_PATCH | Freq: Every day | TRANSDERMAL | Status: DC
  Administered 2021-05-06 – 2021-05-10 (×5): 21 mg via TRANSDERMAL
  Filled 2021-05-05 (×8): qty 1

## 2021-05-05 MED ORDER — OLANZAPINE 5 MG PO TABS
5.0000 mg | ORAL_TABLET | Freq: Two times a day (BID) | ORAL | Status: DC
  Administered 2021-05-06: 5 mg via ORAL
  Filled 2021-05-05 (×4): qty 1
  Filled 2021-05-05: qty 2

## 2021-05-05 NOTE — Progress Notes (Signed)
[facility] Post 1:1 Observation Documentation  For the first (8) hours following discontinuation of 1:1 precautions, a progress note entry by nursing staff should be documented at least every 2 hours, reflecting the patient's behavior, condition, mood, and conversation.  Use the progress notes for additional entries.  Time 1:1 discontinued:  1100  Patient's Behavior:  pt has been calm and collected since discharge.  Patient's Condition:  pt denies physical symptoms or pain.   Patient's Conversation:  pt is minimal but pleasant.   [name] [name] 05/05/2021, 1:21 PM

## 2021-05-05 NOTE — [facility] Group Notes (Signed)
Pt did not attend group. 
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
## 2021-05-05 NOTE — [facility] Suicide Risk Assessment (Signed)
[facility] INPATIENT:  Family/Significant Other Suicide Prevention Education  Suicide Prevention Education:  Patient Refusal for Family/Significant Other Suicide Prevention Education: The patient Sharon Ward has refused to provide written consent for family/significant other to be provided Family/Significant Other Suicide Prevention Education during admission and/or prior to discharge.  Physician notified.  Felizardo Hoffmann 05/05/2021, 11:48 AM

## 2021-05-05 NOTE — Progress Notes (Addendum)
[facility] MD Progress Note  05/06/2021 10:41 AM Sharon Ward  MRN:  [id number] Chart review showed patient did not get nightly dose of Zyprexa yesterday.  She received as needed Tylenol today, and as needed Zyprexa yesterday afternoon due to anxiety and agitation.  Patient slept 10.75 hours last night.  Subjective: Patient is seen and examined today. Patient states she is very depressed today. Patient has poor eye contact today. She feels anxious. She slept well last night.  Her appetite is poor. She did not eat any breakfast today and did not eat lunch and dinner last night as she was sleeping. She states she did not call her boyfriend last night. She states she does not want to talk about him at all. She states she has too many thoughts in her mind. She denies active or passive SI, HI, AVH. She reports feeling tired.  She denies headache, nausea, abdominal pain, constipation, and diarrhea. She states she has a court date coming on September 30 th for CPS case and she wants Child psychotherapist to help to reschedule that.  She states she needs help scheduling the anger management classes also as she has to complete 8 weeks classes before March 8.  She states that she feels safe here and will try to not call her boyfriend again.  She states that she has not talked to her daughter as she is in foster care. She denies any medication side effects and has been tolerating it well. She denies any concerns. This afternoon at 2 PM -patient was agitated and stated they were going to "tear shit up" if staff didn't comply.  Patient started taking her head on the desk x3.  Patient continued to be verbally abusive towards staff.  Patient required PRN's Zyprexa 20 mg with Benedryl 50 mg oral.   Principal Problem: Bipolar affective disorder, current episode depressed (HCC) Diagnosis: Principal Problem:   Bipolar affective disorder, current episode depressed (HCC) Active Problems:   Anxiety   Suicide attempt (HCC)   Prolonged QT  interval   Marijuana abuse  Total Time spent with patient:  35 minutes  Past Psychiatric History: see H&P  Past Medical History:  Past Medical History:  Diagnosis Date   Asthma    Bipolar 1 disorder, mixed (HCC)    Depression    Generalized anxiety disorder    Relationship dysfunction     Past Surgical History:  Procedure Laterality Date   PILONIDAL CYST / SINUS EXCISION  09/11/2013   PILONIDAL CYST EXCISION  05/17/2014   Pilonidal cystectomy with cleft lip   Family History:  Family History  Problem Relation Age of Onset   Healthy Mother    Healthy Father    Family Psychiatric  History: see H&P Social History:  Social History   Substance and Sexual Activity  Alcohol Use Not Currently   Comment: occasional prior to pregnancy     Social History   Substance and Sexual Activity  Drug Use Yes   Frequency: 4.0 times per week   Types: Marijuana   Comment: reports use 4 or 5 times; last used 05/04/19    Social History   Socioeconomic History   Marital status: Significant Other    Spouse name: Not on file   Number of children: 1   Years of education: 10   Highest education level: 10th grade  Occupational History   Not on file  Tobacco Use   Smoking status: Every Day    Packs/day: 1.00    Years: 15.00  Pack years: 15.00    Types: Cigarettes   Smokeless tobacco: Never   Tobacco comments:    only smoke a "couple" of cigarettes when stressed or anxious, socially with friends per [facility] chart  Vaping Use   Vaping Use: Never used  Substance and Sexual Activity   Alcohol use: Not Currently    Comment: occasional prior to pregnancy   Drug use: Yes    Frequency: 4.0 times per week    Types: Marijuana    Comment: reports use 4 or 5 times; last used 05/04/19   Sexual activity: Yes    Partners: Male  Other Topics Concern   Not on file  Social History Narrative   Not on file   Social Determinants of Health   Financial Resource Strain: Not on file  Food  Insecurity: Food Insecurity Present   Worried About Running Out of Food in the Last Year: Sometimes true   Ran Out of Food in the Last Year: Sometimes true  Transportation Needs: No Transportation Needs   Lack of Transportation (Medical): No   Lack of Transportation (Non-Medical): No  Physical Activity: Not on file  Stress: Not on file  Social Connections: Not on file   Additional Social History:                         Sleep: Good  Appetite:  Poor  Current Medications: Current Facility-Administered Medications  Medication Dose Route Frequency Provider Last Rate Last Admin   acetaminophen (TYLENOL) tablet 650 mg  650 mg Oral Q6H PRN Thermon Leyland, NP   650 mg at 05/06/21 0806   alum & mag hydroxide-simeth (MAALOX/MYLANTA) 200-200-20 MG/5ML suspension 30 mL  30 mL Oral Q4H PRN Thermon Leyland, NP       hydrOXYzine (ATARAX/VISTARIL) tablet 25 mg  25 mg Oral TID PRN Laveda Abbe, NP       LORazepam (ATIVAN) injection 2 mg  2 mg Intramuscular Q6H PRN Laveda Abbe, NP       magnesium hydroxide (MILK OF MAGNESIA) suspension 30 mL  30 mL Oral Daily PRN Thermon Leyland, NP       nicotine (NICODERM CQ - dosed in mg/24 hours) patch 21 mg  21 mg Transdermal Daily Laveda Abbe, NP   21 mg at 05/06/21 0725   OLANZapine (ZYPREXA) tablet 5 mg  5 mg Oral BID Karsten Ro, MD   5 mg at 05/06/21 0725   OLANZapine zydis (ZYPREXA) disintegrating tablet 10 mg  10 mg Oral Q8H PRN Laveda Abbe, NP   10 mg at 05/05/21 1338   And   ziprasidone (GEODON) injection 20 mg  20 mg Intramuscular PRN Laveda Abbe, NP       traZODone (DESYREL) tablet 50 mg  50 mg Oral QHS PRN Laveda Abbe, NP        Lab Results:  No results found for this or any previous visit (from the past 48 hour(s)).   Blood Alcohol level:  Lab Results  Component Value Date   ETH <10 05/03/2021   ETH <10 04/26/2021    Metabolic Disorder Labs: Lab Results  Component Value  Date   HGBA1C 5.2 04/01/2021   MPG 102.54 04/01/2021   MPG 94 02/04/2021   No results found for: PROLACTIN Lab Results  Component Value Date   CHOL 195 04/01/2021   TRIG 77 04/01/2021   HDL 77 04/01/2021   CHOLHDL 2.5 04/01/2021  VLDL 15 04/01/2021   LDLCALC 103 (H) 04/01/2021   LDLCALC 86 02/04/2021    Physical Findings: AIMS: Facial and Oral Movements Muscles of Facial Expression: None, normal Lips and Perioral Area: None, normal Jaw: None, normal Tongue: None, normal,Extremity Movements Upper (arms, wrists, hands, fingers): None, normal Lower (legs, knees, ankles, toes): None, normal, Trunk Movements Neck, shoulders, hips: None, normal, Overall Severity Severity of abnormal movements (highest score from questions above): None, normal Incapacitation due to abnormal movements: None, normal Patient's awareness of abnormal movements (rate only patient's report): No Awareness, Dental Status Current problems with teeth and/or dentures?: No Does patient usually wear dentures?: No  CIWA:    COWS:     Musculoskeletal: Strength & Muscle Tone: within normal limits Gait & Station: normal Patient leans: N/A  Psychiatric Specialty Exam:  Presentation  General Appearance: Appropriate for Environment  Eye Contact:Minimal  Speech:Clear and Coherent  Speech Volume:Normal  Handedness:Right   Mood and Affect  Mood:Depressed; Anxious  Affect:Depressed   Thought Process  Thought Processes:Coherent; Goal Directed  Descriptions of Associations:Intact  Orientation:Full (Time, Place and Person)  Thought Content:Logical  History of Schizophrenia/Schizoaffective disorder:No  Duration of Psychotic Symptoms:No data recorded Hallucinations:Hallucinations: None  Ideas of Reference:None  Suicidal Thoughts:Suicidal Thoughts: No  Homicidal Thoughts:Homicidal Thoughts: No   Sensorium  Memory:Immediate Good; Recent Good; Remote  Good  Judgment:Impaired  Insight:Fair   Executive Functions  Concentration:Good  Attention Span:Fair  Recall:Fair  Fund of Knowledge:Good  Language:Good   Psychomotor Activity  Psychomotor Activity:Psychomotor Activity: Normal   Assets  Assets:Communication Skills; Desire for Improvement; Physical Health; Resilience   Sleep  Sleep:Sleep: Good    Physical Exam: Physical Exam Vitals and nursing note reviewed.  Constitutional:      General: She is not in acute distress.    Appearance: Normal appearance. She is not ill-appearing, toxic-appearing or diaphoretic.  HENT:     Head: Normocephalic and atraumatic.  Pulmonary:     Effort: Pulmonary effort is normal.  Neurological:     General: No focal deficit present.     Mental Status: She is alert and oriented to person, place, and time.   Review of Systems  Constitutional:  Positive for malaise/fatigue. Negative for chills and fever.  HENT:  Negative for hearing loss.   Eyes:  Negative for blurred vision.  Respiratory:  Negative for cough and shortness of breath.   Cardiovascular:  Negative for chest pain.  Gastrointestinal:  Negative for abdominal pain, nausea and vomiting.  Neurological:  Positive for dizziness. Negative for headaches.  Blood pressure 112/63, pulse 93, temperature 98.7 F (37.1 C), temperature source Oral, resp. rate 18, height 5\' 3"  (1.6 m), weight 81.2 kg, last menstrual period 04/19/2021, SpO2 99 %, not currently breastfeeding. Body mass index is 31.71 kg/m.   Treatment Plan Summary: Daily contact with patient to assess and evaluate symptoms and progress in treatment Melisia Leming is a 25 year old female with past medical history significant for bipolar disorder, depression, anxiety, multiple suicidal attempts initially presented to [facility] long ED after overdosing with 30 60 mg of Latuda and 15 100 mg sertraline in a suicidal attempt.  Patient was assessed by counselor and was recommended  for inpatient admission. Patient admitted to inpatient unit at [facility] on 05/04/2021.   Daily contact with patient to assess and evaluate symptoms and progress in treatment   Labs reviewed  Labs reviewed showed CMP within normal limits, CBC within normal limits Acetaminophen level less than 10, salicylate level less than 7 UDS positive  for THC Glucose 89. Pregnancy test negative. T3-154 T4-0.71 (04/02/2021) Influenza A, influenza B, COVID negative HbA1c-5.2 (04/01/2021) EKG-sinus rhythm, QTc 516.   Repeat EKG  (05/06/21) shows -sinus bradycardia with PR 59/min and QTC 439.   Safety and Monitoring --  Admission to inpatient psychiatric unit for safety, stabilization and treatment -- Daily contact with patient to assess and evaluate symptoms and progress in treatment -- Patient's case to be discussed in multi-disciplinary team meeting. -- Patient will be encouraged to participate in the therapeutic group milieu. -- Observation Level : q15 minute checks -- Vital signs:  q12 hours -- Precautions: suicide, elopement  Plan  -Monitor Vitals. -Monitor for Suicidal Ideation. -Monitor for withdrawal symptoms. -Monitor for medication side effects.   Bipolar disorder, current episode depressed -Increase Zyprexa 10 mg twice daily for mood stability. Pt required extra 20 mg Zyprexa PRN for agitation today.  -Home Latuda and sertraline stopped due to being ineffective.  -Monitor Qtc. Repeat EKG  (05/06/21) shows -sinus bradycardia with PR 59/min and QTC 439.  -We will consider adding Remeron 7.5 nightly tomorrow if  appetite still poor at tomorrow's evaluation.  Nicotine dependence Nicotine patch 21 mg every 24 hours.   PRN's  -Continue Tylenol 650 mg every 6 hours as needed for pain or fever -Continue Milk of Magnesia 30 ml PRN Daily for Constipation. -Continue Maalox/Mylanta 30 ml Q4H PRN for Indigestion. -Continue Hydroxyzine 25 mg TID PRN for Anxiety. -Continue Trazodone 50 mg QHS PRN for  sleep.  -Agitation protocol with Zyprexa , Ativan and Geodon -Ativan 2 mg IM every 6 hours as needed for anxiety.   Discharge Planning: Social work and case management to assist with discharge planning and identification of hospital follow-up needs prior to discharge.  Social worker to assist with rescheduling/sending letter to court for court dates. Estimated LOS: 5 to 7 days. Discharge Concerns: Need to establish a safety plan; Medication compliance and effectiveness. Discharge Goals: Return home with outpatient referrals for mental health follow-up including medication management/psychotherapy.   Karsten Ro, MD 05/06/2021, 10:41 AM
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
## 2021-05-05 NOTE — Progress Notes (Signed)
D: Spoke with patient briefly upon awakening. Patient is minimal and guarded upon interaction. Patient denies SI/HI at this time. Patient also denies AH/VH at this time. A: Provided positive reinforcement and encouragement. Patient remains under 1:1 observation while awake for safety.  R: Patient currently asleep in bed. Patient cooperative and receptive to efforts. 1:1 observation while awake maintained.   05/05/21 1945  Psych Admission Type (Psych Patients Only)  Admission Status Involuntary  Psychosocial Assessment  Patient Complaints Anxiety;Sadness;Worrying  Eye Contact Brief  Facial Expression Sad;Sullen;Pensive  Affect Sullen;Sad  Speech Logical/coherent  Interaction No initiation  Motor Activity Slow  Appearance/Hygiene Unremarkable  Behavior Characteristics Cooperative  Mood Anxious  Thought Process  Coherency Concrete thinking  Content Blaming others  Delusions None reported or observed  Perception WDL  Hallucination None reported or observed  Judgment Poor  Confusion None  Danger to Self  Current suicidal ideation? Denies  Danger to Others  Danger to Others None reported or observed

## 2021-05-05 NOTE — Progress Notes (Signed)
Sharon Ward is currently resting with her eyes closed and appears to be asleep.  1:1 will resume when she is awake.

## 2021-05-05 NOTE — BHH Group Notes (Signed)
Adult Psychoeducational Group Note  Date:  05/05/2021 Time:  8:50 AM  Group Topic/Focus:  Goals Group:   The focus of this group is to help patients establish daily goals to achieve during treatment and discuss how the patient can incorporate goal setting into their daily lives to aide in recovery.  Participation Level:  Did Not Attend  Margaret Pyle 05/05/2021, 8:50 AM

## 2021-05-05 NOTE — B[facility] Group Notes (Signed)
Adult Psychoeducational Group Note  Date:  05/05/2021 Time:  12:16 PM  Group Topic/Focus:  Self Esteem Action Plan:   The focus of this group is to help patients create a plan to continue to build self-esteem after discharge.  Participation Level:  Active  Participation Quality:  Appropriate and Attentive  Affect:  Appropriate  Cognitive:  Alert and Appropriate  Insight: Good  Engagement in Group:  Engaged  Modes of Intervention:  Discussion and Education  Additional Comments:  Pt and the writer spoke 1:1 in the group room about learning what the pt can do to put herself first and not distracting herself w/ other tasks. Pt was given materials for a self esteem exercise. Pt wants to ultimately work on becoming independent to have a better relationship w her daughter.   Sharon Ward Sharon Ward 05/05/2021, 12:16 PM
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
## 2021-05-05 NOTE — Progress Notes (Signed)
Psychoeducational Group Note  Date:  05/05/2021 Time:  2041  Group Topic/Focus:  Wrap-Up Group:   The focus of this group is to help patients review their daily goal of treatment and discuss progress on daily workbooks.  Participation Level: Did Not Attend  Participation Quality:  Not Applicable  Affect:  Not Applicable  Cognitive:  Not Applicable  Insight:  Not Applicable  Engagement in Group: Not Applicable  Additional Comments:  The patient did not attend group this evening.   Hazle Coca S 05/05/2021, 8:41 PM

## 2021-05-05 NOTE — Progress Notes (Signed)
1:1 note D:  Sharon Ward was in the bathroom taking shower with the help of staff.  She apparently vomited on herself.  She is currently calm and cooperative.   A:  1:1 sitter by her side. R:  She remains safe on the unit.

## 2021-05-05 NOTE — Progress Notes (Signed)
1:1 notes  Pt hasn't had any violent outbursts since this morning. Pt has been seen in the milieu and has been resting since mid afternoon. Pt was provided meal tray. Pt continues to sleep. Q36m safety checks implemented and continued. Will continue to monitor. 1:1 while awake continues.

## 2021-05-05 NOTE — Progress Notes (Signed)
1:1 initial note  Pt was seen on the phone yelling and cursing. Pt was observed by staff, banging her head on the wall. Pt was provided medication. Pt educated on 1:1 and criteria that needed to be met to come off. Sitter within arms reach of sitter. Pt safe on the unit. Q68msafety checks implemented and continued. Will continue to monitor.
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
## 2021-05-05 NOTE — Plan of Care (Signed)
  Problem: Education: Goal: Utilization of techniques to improve thought processes will improve Outcome: Progressing Goal: Knowledge of the prescribed therapeutic regimen will improve Outcome: Progressing   Problem: Education: Goal: Knowledge of [facility] Education information/materials will improve Outcome: Progressing Goal: Emotional status will improve Outcome: Progressing

## 2021-05-05 NOTE — Group Note (Signed)
Recreation Therapy Group Note   Group Topic:Animal Assisted Therapy   Group Date: 05/05/2021 Start Time: 1430 End Time: 1515 Facilitators: Caroll Rancher, LRT/CTRS Location: [address] Day[address]  Animal-Assisted Activity (AAA) Program Checklist/Progress Notes Patient Eligibility Criteria Checklist & Daily Group note for Rec Tx Intervention  AAA/T Program Assumption of Risk Form signed by Patient/ or Parent Legal Guardian Yes  Patient is free of allergies or severe asthma Yes  Patient reports no fear of animals Yes  Patient reports no history of cruelty to animals Yes  Patient understands his/her participation is voluntary Yes  Patient washes hands before animal contact Yes  Patient washes hands after animal contact Yes   Affect/Mood: Appropriate   Participation Level: Engaged   Participation Quality: Independent   Behavior: Appropriate and Calm   Speech/Thought Process: Focused   Insight: Good   Judgement: Good   Modes of Intervention: Pet Therapy   Patient Response to Interventions:  Engaged   Education Outcome:  Acknowledges education and In group clarification offered    Clinical Observations/Individualized Feedback: Pt engaged with therapy dog and peers appropriately.  Pt asked appropriate questions.  Pt was engaged throughout activity.  Pt shared stories about their pets at home.    Plan: Continue to engage patient in RT group sessions 2-3x/week.   Caroll Rancher, LRT/CTRS 05/05/2021 3:56 PM

## 2021-05-05 NOTE — Progress Notes (Signed)
Sharon Ward is currently resting with her eyes closed and appears to be asleep.  No behavior issues at this time.  1:1 will resume when she is awake.     05/04/21 1943  Psych Admission Type (Psych Patients Only)  Admission Status Involuntary  Psychosocial Assessment  Patient Complaints Irritability  Eye Contact Brief  Facial Expression Flat  Affect Flat  Speech Argumentative  Interaction Cautious  Motor Activity Slow  Appearance/Hygiene Unremarkable  Behavior Characteristics Irritable  Mood Irritable  Thought Process  Coherency WDL  Content WDL  Delusions None reported or observed  Perception WDL  Hallucination None reported or observed  Judgment Impaired  Confusion None  Danger to Self  Current suicidal ideation? Denies  Danger to Others  Danger to Others None reported or observed

## 2021-05-05 NOTE — Progress Notes (Signed)
1:1 order changes to while awake.  Pt is currently resting with her eyes closed and appears to be asleep.  We will continue to monitor.

## 2021-05-06 ENCOUNTER — Encounter (HOSPITAL_COMMUNITY): Payer: Self-pay

## 2021-05-06 ENCOUNTER — Ambulatory Visit (HOSPITAL_COMMUNITY): Payer: Medicaid Other

## 2021-05-06 MED ORDER — OLANZAPINE 10 MG PO TABS
10.0000 mg | ORAL_TABLET | ORAL | Status: DC
  Administered 2021-05-06 – 2021-05-11 (×10): 10 mg via ORAL
  Filled 2021-05-06 (×15): qty 1

## 2021-05-06 MED ORDER — OLANZAPINE 10 MG PO TBDP
20.0000 mg | ORAL_TABLET | Freq: Once | ORAL | Status: AC
  Administered 2021-05-06: 20 mg via ORAL

## 2021-05-06 MED ORDER — DIPHENHYDRAMINE HCL 50 MG/ML IJ SOLN: 50.0000 mg | Freq: Four times a day (QID) | INTRAMUSCULAR | Status: DC | PRN

## 2021-05-06 MED ORDER — DIPHENHYDRAMINE HCL 25 MG PO CAPS
50.0000 mg | ORAL_CAPSULE | Freq: Four times a day (QID) | ORAL | Status: DC | PRN
  Administered 2021-05-06: 50 mg via ORAL
  Filled 2021-05-06: qty 2

## 2021-05-06 MED ORDER — OLANZAPINE 10 MG PO TBDP
ORAL_TABLET | ORAL | Status: AC
  Filled 2021-05-06: qty 2

## 2021-05-06 MED ORDER — DIPHENHYDRAMINE HCL 25 MG PO CAPS
ORAL_CAPSULE | ORAL | Status: AC
  Filled 2021-05-06: qty 2

## 2021-05-06 NOTE — Group Note (Signed)
Recreation Therapy Group Note   Group Topic:Leisure Education  Group Date: 05/06/2021 Start Time: 1000 End Time: 1050 Facilitators: Caroll Rancher, LRT/CTRS Location: [address] Dayroom  Goal Area(s) Addresses:  Patient will identify positive leisure activities for use post discharge. Patient will identify at least one positive benefit of participation in leisure activities.   Group Description: Patients will be divided into 2 teams.  Each team will take turns pulling a slip of paper from the can with an activity on it.  Members of the teams will take turns drawing what is on the paper.  Each team gets one minute to draw and guess what the picture is.  If the team gets the correct answer, they receive a point.  If the team doesn't get the answer, the other team can steal the point.  The team with the most points wins the game.   Affect/Mood: Appropriate   Participation Level: Active and Engaged   Participation Quality: Independent   Behavior: Appropriate   Speech/Thought Process: Focused   Insight: Good   Judgement: Good   Modes of Intervention: Competitive Play   Patient Response to Interventions:  Attentive and Engaged   Education Outcome:  Acknowledges education and In group clarification offered    Clinical Observations/Individualized Feedback: Pt was engaged, bright and worked well within her team.  Pt described leisure as a time to relax or do something fun.  Pt was engaged during group.  Pt was focused and was appropriate throughout.     Plan: Continue to engage patient in RT group sessions 2-3x/week.   Caroll Rancher, LRT/CTRS 05/06/2021 12:12 PM

## 2021-05-06 NOTE — Plan of Care (Signed)
  Problem: Education: Goal: Ability to state activities that reduce stress will improve Outcome: Progressing   Problem: Education: Goal: Mental status will improve Outcome: Progressing Goal: Verbalization of understanding the information provided will improve Outcome: Progressing

## 2021-05-06 NOTE — Progress Notes (Signed)
1:1 note  Pt continued to try and test boundaries with staff with going to their locker. Pt stated they were going to "tear shit up" if staff didn't comply. Pt was given opportunities for PRN medications. Given their behavior, IM medications were offered. Pt refused and started hitting their head on desk x3. When behavior was ineffective to gain compliance with pt's wants, pt stopped behavior. Pt was offered a long sleeve shirt from another pt but declined. Pt was given oral PRN medications. Pt continued to be verbally abusive toward staff. Pt was educated on why they were on the 1:1 and how to graduate from this level of care. No evidence of learning noted. Pt safe on unit. Q43m safety checks implemented and continued. 1:1 continues for safety. Will continue to monitor.

## 2021-05-06 NOTE — [facility] Group Notes (Signed)
Pt did not attend evening NA group.  

## 2021-05-06 NOTE — Progress Notes (Signed)
1:1 note  Pt found in bed; compliant with morning medication administration. Pt denies any physical complaints or pain. Pt educated on the importance of not going back into their locker for items. Pt requesting shirt. Pt voices understanding. Pt denies si/hi/ah/vh and verbally agrees to approach staff if these become apparent or before harming themselves/others while at [facility].  A: Pt provided support and encouragement. Pt given medication per protocol and standing orders. Q80m safety checks implemented and continued.  R: Pt safe on the unit. Will continue to monitor. 1:1 continues for safety.

## 2021-05-06 NOTE — Progress Notes (Signed)
1:1 note  Pt has been seen more in the milieu, interacting appropriately. Pt did ask for a pregnancy test because pt states they had unprotected sex 3x this week and they haven't been taking their birth control as prescribed. Pt safe on the unit. Q38m safety checks implemented and continued. Will continue to monitor. 1:1 continues for safety.

## 2021-05-06 NOTE — Group Note (Signed)
[facility] LCSW Group Therapy Note   Group Date: 05/06/2021 Start Time: 1300 End Time: 1400   Type of Therapy/Topic:  Group Therapy:  Emotion Regulation  Participation Level:  Minimal   Mood:  Description of Group:    The purpose of this group is to assist patients in learning to regulate negative emotions and experience positive emotions. Patients will be guided to discuss ways in which they have been vulnerable to their negative emotions. These vulnerabilities will be juxtaposed with experiences of positive emotions or situations, and patients challenged to use positive emotions to combat negative ones. Special emphasis will be placed on coping with negative emotions in conflict situations, and patients will process healthy conflict resolution skills.  Therapeutic Goals: Patient will identify two positive emotions or experiences to reflect on in order to balance out negative emotions:  Patient will label two or more emotions that they find the most difficult to experience:  Patient will be able to demonstrate positive conflict resolution skills through discussion or role plays:   Summary of Patient Progress:   Patient attended group but participated minimally. Patient colored throughout the group.  Patient offered multiple times to interact with group but she continued to minimally participate.     Therapeutic Modalities:   Cognitive Behavioral Therapy Feelings Identification Dialectical Behavioral Therapy   Shalon Salado E Lylah Lantis, LCSW

## 2021-05-06 NOTE — BH IP Treatment Plan (Signed)
Interdisciplinary Treatment and Diagnostic Plan Update  05/06/2021 Time of Session:  Sharon Ward MRN: [id number]  Principal Diagnosis: Bipolar affective disorder, current episode depressed (HCC)  Secondary Diagnoses: Principal Problem:   Bipolar affective disorder, current episode depressed (HCC) Active Problems:   Anxiety   Suicide attempt (HCC)   Prolonged QT interval   Marijuana abuse   Current Medications:  Current Facility-Administered Medications  Medication Dose Route Frequency Provider Last Rate Last Admin   acetaminophen (TYLENOL) tablet 650 mg  650 mg Oral Q6H PRN Thermon Leyland, NP   650 mg at 05/06/21 0806   alum & mag hydroxide-simeth (MAALOX/MYLANTA) 200-200-20 MG/5ML suspension 30 mL  30 mL Oral Q4H PRN Thermon Leyland, NP       hydrOXYzine (ATARAX/VISTARIL) tablet 25 mg  25 mg Oral TID PRN Laveda Abbe, NP       LORazepam (ATIVAN) injection 2 mg  2 mg Intramuscular Q6H PRN Laveda Abbe, NP       magnesium hydroxide (MILK OF MAGNESIA) suspension 30 mL  30 mL Oral Daily PRN Thermon Leyland, NP       nicotine (NICODERM CQ - dosed in mg/24 hours) patch 21 mg  21 mg Transdermal Daily Laveda Abbe, NP   21 mg at 05/06/21 0725   OLANZapine (ZYPREXA) tablet 5 mg  5 mg Oral BID Karsten Ro, MD   5 mg at 05/06/21 0725   OLANZapine zydis (ZYPREXA) disintegrating tablet 10 mg  10 mg Oral Q8H PRN Laveda Abbe, NP   10 mg at 05/05/21 1338   And   ziprasidone (GEODON) injection 20 mg  20 mg Intramuscular PRN Laveda Abbe, NP       traZODone (DESYREL) tablet 50 mg  50 mg Oral QHS PRN Laveda Abbe, NP       PTA Medications: Medications Prior to Admission  Medication Sig Dispense Refill Last Dose   ferrous sulfate 325 (65 FE) MG EC tablet Take 1 tablet (325 mg total) by mouth at bedtime. For anemia  0    lurasidone (LATUDA) 20 MG TABS tablet Take 3 tablets (60 mg total) by mouth daily after supper. For mood control 90 tablet 0     sertraline (ZOLOFT) 100 MG tablet Take 1 tablet (100 mg total) by mouth at bedtime. For depression 30 tablet 0    SPRINTEC 28 0.25-35 MG-MCG tablet Take 1 tablet by mouth daily. (Patient taking differently: Take 1 tablet by mouth at bedtime.) 28 tablet 11     Patient Stressors:    Patient Strengths:    Treatment Modalities: Medication Management, Group therapy, Case management,  1 to 1 session with clinician, Psychoeducation, Recreational therapy.   Physician Treatment Plan for Primary Diagnosis: Bipolar affective disorder, current episode depressed (HCC) Long Term Goal(s): Improvement in symptoms so as ready for discharge   Short Term Goals: Ability to identify changes in lifestyle to reduce recurrence of condition will improve Ability to verbalize feelings will improve Ability to disclose and discuss suicidal ideas Ability to demonstrate self-control will improve Ability to identify and develop effective coping behaviors will improve Ability to maintain clinical measurements within normal limits will improve Compliance with prescribed medications will improve Ability to identify triggers associated with substance abuse/mental health issues will improve  Medication Management: Evaluate patient's response, side effects, and tolerance of medication regimen.  Therapeutic Interventions: 1 to 1 sessions, Unit Group sessions and Medication administration.  Evaluation of Outcomes: Progressing  Physician Treatment Plan for Secondary  Diagnosis: Principal Problem:   Bipolar affective disorder, current episode depressed (HCC) Active Problems:   Anxiety   Suicide attempt (HCC)   Prolonged QT interval   Marijuana abuse  Long Term Goal(s): Improvement in symptoms so as ready for discharge   Short Term Goals: Ability to identify changes in lifestyle to reduce recurrence of condition will improve Ability to verbalize feelings will improve Ability to disclose and discuss suicidal  ideas Ability to demonstrate self-control will improve Ability to identify and develop effective coping behaviors will improve Ability to maintain clinical measurements within normal limits will improve Compliance with prescribed medications will improve Ability to identify triggers associated with substance abuse/mental health issues will improve     Medication Management: Evaluate patient's response, side effects, and tolerance of medication regimen.  Therapeutic Interventions: 1 to 1 sessions, Unit Group sessions and Medication administration.  Evaluation of Outcomes: Progressing   RN Treatment Plan for Primary Diagnosis: Bipolar affective disorder, current episode depressed (HCC) Long Term Goal(s): Knowledge of disease and therapeutic regimen to maintain health will improve  Short Term Goals: Ability to verbalize frustration and anger appropriately will improve, Ability to demonstrate self-control, and Ability to participate in decision making will improve  Medication Management: RN will administer medications as ordered by provider, will assess and evaluate patient's response and provide education to patient for prescribed medication. RN will report any adverse and/or side effects to prescribing provider.  Therapeutic Interventions: 1 on 1 counseling sessions, Psychoeducation, Medication administration, Evaluate responses to treatment, Monitor vital signs and CBGs as ordered, Perform/monitor CIWA, COWS, AIMS and Fall Risk screenings as ordered, Perform wound care treatments as ordered.  Evaluation of Outcomes: Progressing   LCSW Treatment Plan for Primary Diagnosis: Bipolar affective disorder, current episode depressed (HCC) Long Term Goal(s): Safe transition to appropriate next level of care at discharge, Engage patient in therapeutic group addressing interpersonal concerns.  Short Term Goals: Engage patient in aftercare planning with referrals and resources, Increase social  support, and Increase ability to appropriately verbalize feelings  Therapeutic Interventions: Assess for all discharge needs, 1 to 1 time with Social worker, Explore available resources and support systems, Assess for adequacy in community support network, Educate family and significant other(s) on suicide prevention, Complete Psychosocial Assessment, Interpersonal group therapy.  Evaluation of Outcomes: Progressing   Progress in Treatment: Attending groups: Yes. Participating in groups: Yes. Taking medication as prescribed: Yes. Toleration medication: Yes. Family/Significant other contact made: No, will contact:  declined consents Patient understands diagnosis: Yes. Discussing patient identified problems/goals with staff: Yes. Medical problems stabilized or resolved: Yes. Denies suicidal/homicidal ideation: Yes. Issues/concerns per patient self-inventory: No. Other: None  New problem(s) identified: No, Describe:  None  New Short Term/Long Term Goal(s):medication stabilization, elimination of SI thoughts, development of comprehensive mental wellness plan.   Patient Goals:  "Not call him"  Discharge Plan or Barriers: Patient recently admitted. CSW will continue to follow and assess for appropriate referrals and possible discharge planning.   Reason for Continuation of Hospitalization: Aggression Depression Medication stabilization Suicidal ideation  Estimated Length of Stay: 3-5 days   Scribe for Treatment Team: Chrys Racer 05/06/2021 10:08 AM

## 2021-05-06 NOTE — [facility] Group Notes (Signed)
Adult Psychoeducational Group Note  Date:  05/06/2021 Time:  9:57 AM  Group Topic/Focus:  Goals Group:   The focus of this group is to help patients establish daily goals to achieve during treatment and discuss how the patient can incorporate goal setting into their daily lives to aide in recovery.  Participation Level:  Active   Engagement in Group:  Engaged  Modes of Intervention:  Discussion  Additional Comments:  Pt attended and participated in the morning goals group. Pt has a goal of not making phone calls to significant other today.   Sharon Ward Sharon Ward 05/06/2021, 9:57 AM

## 2021-05-07 DIAGNOSIS — F3181 Bipolar II disorder: Secondary | ICD-10-CM

## 2021-05-07 MED ORDER — FLUOXETINE HCL 10 MG PO CAPS
10.0000 mg | ORAL_CAPSULE | Freq: Every day | ORAL | Status: DC
  Administered 2021-05-08 – 2021-05-11 (×4): 10 mg via ORAL
  Filled 2021-05-07 (×5): qty 1

## 2021-05-07 NOTE — Progress Notes (Signed)
   05/06/21 2135  Psych Admission Type (Psych Patients Only)  Admission Status Involuntary  Psychosocial Assessment  Patient Complaints Anxiety;Irritability  Eye Contact Avertive;Brief  Facial Expression Anxious;Sullen;Sad;Worried  Affect Anxious;Sad;Sullen  Speech Logical/coherent  Interaction Attention-seeking  Motor Activity Slow  Appearance/Hygiene Unremarkable  Behavior Characteristics Anxious;Irritable  Mood Depressed;Anxious  Thought Process  Coherency Concrete thinking  Content Blaming others  Delusions Persecutory  Perception WDL  Hallucination None reported or observed  Judgment Poor  Confusion None  Danger to Self  Current suicidal ideation? Denies  Danger to Others  Danger to Others None reported or observed

## 2021-05-07 NOTE — BHH Counselor (Signed)
CSW spoke with pt and discussed where she will go post discharge. CSW gave pt a list of shelters, boarding houses, [facility] and encouraged her to make calls so she has somewhere to go at discharge. Pt stated "you want me to make these calls?". CSW explained that pt must participate actively in her aftercare planning. Pt states that her plan is to go to the [facility] at discharge so that they can place her in a shelter. Pt states she will call [facility] as a back-up plan.   Fredirick Lathe, LCSWA Clinicial Social Worker [employer]

## 2021-05-07 NOTE — BHH Group Notes (Signed)
B[facility] Group Notes:  (Nursing/MHT/Case Management/Adjunct)  Date:  05/07/2021  Time:  8:52 PM  Type of Therapy:   Wrap up  Participation Level:  Active  Participation Quality:  Appropriate  Affect:  Appropriate  Cognitive:  Appropriate  Insight:  Improving  Engagement in Group:  Developing/Improving  Modes of Intervention:  Discussion  Summary of Progress/Problems: PT is calm and in good spirits. PT has made friends and is excited about her progress.  Lorita Officer 05/07/2021, 8:52 PM

## 2021-05-07 NOTE — Progress Notes (Addendum)
[facility] MD Progress Note  05/07/2021 3:32 PM Sharon Ward  MRN:  [id number] Chart review showed She has been taking her scheduled meds.  She received as needed Trazodone, hydroxyzine Benadryl, and Tylenol and extra dose of Zyprexa 20 mg .  Patient slept 6.75 hours last night. Yesterday afternoon she asked for pregnancy test as she had unprotected sex x3 she is not on any birth control pills.  Repeat pregnancy test was ordered for 05/10/2021. Subjective:  Patient is seen and examined today with Dr. Mason Jim and Dr. Sherron Flemings. Patient states she is feeling better and calmer but has had some residual depressive symptoms when she contemplates her psychosocial stressors. Patient has good eye contact. She denies any anxiety. She slept well last night. Her appetite is good and she ate her breakfast this morning and, lunch and dinner yesterday . She states she got upset yesterday as she called her boyfriend to bring her some clothes and he ignored her.  She states she felt disrespected by a nurse yesterday which triggered her emotional outburst. She states she got upset due to staff's behavior and started banging her head against the nursing station.  She reports nightmares last night of people being murdered and her being raped.  She denies active or passive SI, HI, AVH.  She states she had some nausea yesterday.  Currently, she denies headache, nausea, abdominal pain, constipation, and diarrhea.  She states that she has not talked to her daughter as she is in foster care. She denies any medication side effects and has been tolerating it well. She denies any concerns.  She is interested in housing/ shelters after discharge.  She states she talked to family services last week but they didn't have any openings in [place].  She states because of her daughter and court dates she would like to stay in [place]. She states she is open to going to other counties if they provide transportation to her daughter and for  court dates.  Patient requested discontinuing one-to-one.  Discussed that if she does well today with no agitation episodes then we may be able to discontinue 1 to 1 before suppertime.  Patient verbalizes understanding.  Discussed about adding Prozac as antidepressant.  Patient agrees with the plan.  Principal Problem: Bipolar affective disorder, current episode depressed (HCC) Diagnosis: Principal Problem:   Bipolar affective disorder, current episode depressed (HCC) Active Problems:   Anxiety   Suicide attempt (HCC)   Prolonged QT interval   Marijuana abuse  Total Time Spent in Direct Patient Care:  I personally spent 35 minutes on the unit in direct patient care. The direct patient care time included face-to-face time with the patient, reviewing the patient's chart, communicating with other professionals, and coordinating care. Greater than 50% of this time was spent in counseling or coordinating care with the patient regarding goals of hospitalization, psycho-education, and discharge planning needs.  Past Psychiatric History: see H&P  Past Medical History:  Past Medical History:  Diagnosis Date   Asthma    Bipolar 1 disorder, mixed (HCC)    Depression    Generalized anxiety disorder    Relationship dysfunction     Past Surgical History:  Procedure Laterality Date   PILONIDAL CYST / SINUS EXCISION  09/11/2013   PILONIDAL CYST EXCISION  05/17/2014   Pilonidal cystectomy with cleft lip   Family History:  Family History  Problem Relation Age of Onset   Healthy Mother    Healthy Father    Family Psychiatric  History: see  H&P  Social History:  Social History   Substance and Sexual Activity  Alcohol Use Not Currently   Comment: occasional prior to pregnancy     Social History   Substance and Sexual Activity  Drug Use Yes   Frequency: 4.0 times per week   Types: Marijuana   Comment: reports use 4 or 5 times; last used 05/04/19    Social History   Socioeconomic  History   Marital status: Significant Other    Spouse name: Not on file   Number of children: 1   Years of education: 10   Highest education level: 10th grade  Occupational History   Not on file  Tobacco Use   Smoking status: Every Day    Packs/day: 1.00    Years: 15.00    Pack years: 15.00    Types: Cigarettes   Smokeless tobacco: Never   Tobacco comments:    only smoke a "couple" of cigarettes when stressed or anxious, socially with friends per [facility] chart  Vaping Use   Vaping Use: Never used  Substance and Sexual Activity   Alcohol use: Not Currently    Comment: occasional prior to pregnancy   Drug use: Yes    Frequency: 4.0 times per week    Types: Marijuana    Comment: reports use 4 or 5 times; last used 05/04/19   Sexual activity: Yes    Partners: Male  Other Topics Concern   Not on file  Social History Narrative   Not on file   Social Determinants of Health   Financial Resource Strain: Not on file  Food Insecurity: Food Insecurity Present   Worried About Running Out of Food in the Last Year: Sometimes true   Ran Out of Food in the Last Year: Sometimes true  Transportation Needs: No Transportation Needs   Lack of Transportation (Medical): No   Lack of Transportation (Non-Medical): No  Physical Activity: Not on file  Stress: Not on file  Social Connections: Not on file    Sleep: Good  Appetite:  Fair  Current Medications: Current Facility-Administered Medications  Medication Dose Route Frequency Provider Last Rate Last Admin   acetaminophen (TYLENOL) tablet 650 mg  650 mg Oral Q6H PRN Thermon Leyland, NP   650 mg at 05/06/21 0806   alum & mag hydroxide-simeth (MAALOX/MYLANTA) 200-200-20 MG/5ML suspension 30 mL  30 mL Oral Q4H PRN Thermon Leyland, NP       diphenhydrAMINE (BENADRYL) capsule 50 mg  50 mg Oral Q6H PRN Mariel Craft, MD   50 mg at 05/06/21 1222   Or   diphenhydrAMINE (BENADRYL) injection 50 mg  50 mg Intramuscular Q6H PRN Mariel Craft, MD        hydrOXYzine (ATARAX/VISTARIL) tablet 25 mg  25 mg Oral TID PRN Laveda Abbe, NP   25 mg at 05/06/21 2019   LORazepam (ATIVAN) injection 2 mg  2 mg Intramuscular Q6H PRN Laveda Abbe, NP       magnesium hydroxide (MILK OF MAGNESIA) suspension 30 mL  30 mL Oral Daily PRN Thermon Leyland, NP       nicotine (NICODERM CQ - dosed in mg/24 hours) patch 21 mg  21 mg Transdermal Daily Laveda Abbe, NP   21 mg at 05/07/21 0927   OLANZapine (ZYPREXA) tablet 10 mg  10 mg Oral BH-qamhs Mariel Craft, MD   10 mg at 05/07/21 0926   OLANZapine zydis (ZYPREXA) disintegrating tablet 10 mg  10 mg  Oral Q8H PRN Laveda Abbe, NP   10 mg at 05/05/21 1338   And   ziprasidone (GEODON) injection 20 mg  20 mg Intramuscular PRN Laveda Abbe, NP       traZODone (DESYREL) tablet 50 mg  50 mg Oral QHS PRN Laveda Abbe, NP   50 mg at 05/06/21 2135    Lab Results:  No results found for this or any previous visit (from the past 48 hour(s)).   Blood Alcohol level:  Lab Results  Component Value Date   ETH <10 05/03/2021   ETH <10 04/26/2021    Metabolic Disorder Labs: Lab Results  Component Value Date   HGBA1C 5.2 04/01/2021   MPG 102.54 04/01/2021   MPG 94 02/04/2021   No results found for: PROLACTIN Lab Results  Component Value Date   CHOL 195 04/01/2021   TRIG 77 04/01/2021   HDL 77 04/01/2021   CHOLHDL 2.5 04/01/2021   VLDL 15 04/01/2021   LDLCALC 103 (H) 04/01/2021   LDLCALC 86 02/04/2021    Physical Findings: AIMS: Facial and Oral Movements Muscles of Facial Expression: None, normal Lips and Perioral Area: None, normal Jaw: None, normal Tongue: None, normal,Extremity Movements Upper (arms, wrists, hands, fingers): None, normal Lower (legs, knees, ankles, toes): None, normal, Trunk Movements Neck, shoulders, hips: None, normal, Overall Severity Severity of abnormal movements (highest score from questions above): None,  normal Incapacitation due to abnormal movements: None, normal Patient's awareness of abnormal movements (rate only patient's report): No Awareness, Dental Status Current problems with teeth and/or dentures?: No Does patient usually wear dentures?: No   Musculoskeletal: Strength & Muscle Tone: within normal limits Gait & Station: normal Patient leans: N/A  Psychiatric Specialty Exam:  Presentation  General Appearance: Appropriate for Environment, casually dressed, fair hygiene  Eye Contact:Fair  Speech:Clear and Coherent  Speech Volume:Normal  Handedness:Right   Mood and Affect  Mood:described as improving - appears mildly anxious  Affect:anxious   Thought Process  Thought Processes:Coherent; Goal Directed but ruminative at times about psychosocial stressors  Descriptions of Associations:Intact  Orientation:Full (Time, Place and Person)  Thought Content:Logical - denies AVH, paranoia, or delusions  History of Schizophrenia/Schizoaffective disorder:No  Duration of Psychotic Symptoms:No data recorded Hallucinations:Hallucinations: None  Ideas of Reference:None  Suicidal Thoughts:Suicidal Thoughts: No  Homicidal Thoughts:Homicidal Thoughts: No   Sensorium  Memory:Immediate Good; Recent Good; Remote Good  Judgment:Improving  Insight:Fair   Executive Functions  Concentration:Good  Attention Span:Good  Recall:Fair  Fund of Knowledge:Good  Language:Good   Psychomotor Activity  Psychomotor Activity:Psychomotor Activity: Normal   Assets  Assets:Communication Skills; Desire for Improvement; Physical Health; Resilience   Sleep  Sleep:6.75 hours  Physical Exam Vitals and nursing note reviewed.  Constitutional:      General: She is not in acute distress.    Appearance: Normal appearance. She is not ill-appearing, toxic-appearing or diaphoretic.  HENT:     Head: Normocephalic and atraumatic.  Pulmonary:     Effort: Pulmonary effort is  normal.  Neurological:     General: No focal deficit present.     Mental Status: She is alert and oriented to person, place, and time.   Review of Systems  Respiratory:  Negative for shortness of breath.   Cardiovascular:  Negative for chest pain.  Gastrointestinal:  Negative for nausea and vomiting.  Neurological:  Negative for headaches.  Blood pressure (!) 100/51, pulse (!) 59, temperature 98.4 F (36.9 C), temperature source Oral, resp. rate 18, height 5\' 3"  (1.6 m),  weight 81.2 kg, last menstrual period 04/19/2021, SpO2 99 %, not currently breastfeeding. Body mass index is 31.71 kg/m.   Treatment Plan Summary: Sharon Ward is a 25 year old female with past medical history significant for bipolar disorder, depression, anxiety, multiple suicidal attempts initially presented to [facility] long ED after overdosing with 30 60 mg of Latuda and 15 100 mg sertraline in a suicidal attempt.  Patient was assessed by counselor and was recommended for inpatient admission. Patient admitted to inpatient unit at [facility] on 05/04/2021.   Daily contact with patient to assess and evaluate symptoms and progress in treatment   Labs reviewed  Labs reviewed showed CMP within normal limits, CBC within normal limits Acetaminophen level less than 10, salicylate level less than 7 UDS positive for THC Glucose 89. Pregnancy test negative. T3-154 T4-0.71 (04/02/2021) Influenza A, influenza B, COVID negative HbA1c-5.2 (04/01/2021) Admission EKG-sinus rhythm, QTc 516.   Repeat EKG  (05/06/21) shows -sinus bradycardia with PR 59/min and QTC 439.  Repeat hCG quantitative, T3, T4, TSH ordered for 10/2  Safety and Monitoring --  Admission to inpatient psychiatric unit for safety, stabilization and treatment -- Daily contact with patient to assess and evaluate symptoms and progress in treatment -- Patient's case to be discussed in multi-disciplinary team meeting. -- Patient will be encouraged to participate in the  therapeutic group milieu. -- Observation Level : q15 minute checks -- Vital signs:  q12 hours -- Precautions: suicide, elopement  Plan  -Monitor Vitals. -Monitor for Suicidal Ideation. -Monitor for withdrawal symptoms. -Monitor for medication side effects.   Bipolar disorder, current episode depressed Cluster B traits r/o BPD -Continue Zyprexa 10 mg twice daily for mood stability.  -Home Latuda and sertraline stopped due to being ineffective.  -Monitor Qtc. Most recent EKG  (05/06/21) shows -sinus bradycardia with PR 59/min and QTC 439.  -Start Prozac 10 mg daily starting tomorrow with monitoring for mood escalation, SI or activation with dosing (r/b/se/a to medication discussed and she consents to med trial) -Stop 1:1- Per nursing staff - no agitated behaviors noted this morning and afternoon.  - Would benefit from DBT after discharge  Nicotine dependence Nicotine patch 21 mg every 24 hours.   PRN's  -Continue Tylenol 650 mg every 6 hours as needed for pain or fever -Continue Milk of Magnesia 30 ml PRN Daily for Constipation. -Continue Maalox/Mylanta 30 ml Q4H PRN for Indigestion. -Continue Hydroxyzine 25 mg TID PRN for Anxiety. -Continue Trazodone 50 mg QHS PRN for sleep.  -Agitation protocol with Zyprexa , Ativan and Geodon -Ativan 2 mg IM every 6 hours as needed for anxiety.   Discharge Planning: Social work and case management to assist with discharge planning and identification of hospital follow-up needs prior to discharge.  Social worker to assist with rescheduling/sending letter to court for court dates. Estimated LOS: 3-4 days. Discharge Concerns: Need to establish a safety plan; Medication compliance and effectiveness. Discharge Goals: Return home with outpatient referrals for mental health follow-up including medication management/psychotherapy.   Karsten Ro, MD 05/07/2021, 3:32 PM

## 2021-05-07 NOTE — Group Note (Signed)
Recreation Therapy Group Note   Group Topic:Communication  Group Date: 05/07/2021 Start Time: 1010 End Time: 7026 Facilitators: Victorino Sparrow, LRT/CTRS Location: [address] Dayroom  Goal Area(s) Addresses:  Patient will effectively listen to complete activity.  Patient will identify communication skills used to make activity successful.  Patient will identify how skills used during activity can be used to reach post d/c goals.    Group Description: Geometric Drawings.  Three volunteers from the peer group will be shown an abstract picture with a particular arrangement of geometrical shapes.  Each round, one 'speaker' will describe the pattern, as accurately as possible without revealing the image to the group.  The remaining group members will listen and draw the picture to reflect how it is described to them. Patients with the role of 'listener' cannot ask clarifying questions but, may request that the speaker repeat a direction. Once the drawings are complete, the presenter will show the rest of the group the picture and compare how close each person came to drawing the picture. LRT will facilitate a post-activity discussion regarding effective communication and the importance of planning, listening, and asking for clarification in daily interactions with others.   Affect/Mood: Appropriate   Participation Level: Active and Engaged   Participation Quality: Independent   Behavior: Appropriate and Attentive    Speech/Thought Process: Focused   Insight: Good   Judgement: Good   Modes of Intervention: Activity   Patient Response to Interventions:  Attentive and Engaged   Education Outcome:  Acknowledges education and In group clarification offered    Clinical Observations/Individualized Feedback: Pt expressed body language can be a cause for misunderstandings in communication.  Pt also explained eye contact can lead to issues with communication as well especially when the two  parties have never met before.  Pt stated when you make assumptions about people is like "judging a book by the cover".  Pt was the bonus presenter.  Pt felt she did "alright".  Pt stated it's hard "word things how you want them to come out" and the activity reminded pt of the telephone game.  Pt did express some frustration with the activity because it was hard to follow along with at points.   Plan: Continue to engage patient in RT group sessions 2-3x/week.   Victorino Sparrow, LRT/CTRS 05/07/2021 11:56 AM

## 2021-05-07 NOTE — Progress Notes (Signed)
1:1 Note D:  Sharon Ward is currently laying in bed with.  She took her hs medications without difficulty.  She continues to voice anxiety 6/10 and depression 5/10.  She was upset earlier due to being triggered in the NA group.  PRN was provided with good relief.   A:  1:1 continues for safety with sitter by her side. R:  Pt remains safe on the unit.

## 2021-05-07 NOTE — Progress Notes (Addendum)
Pt. Wants a pregnancy test (urine and blood test) Pt's last period was 04/16/21. Pt. Reported having unprotected sex during her ovulation cycle. Pt. Was informed that a blood test is ordered for Sunday.

## 2021-05-07 NOTE — BHH Group Notes (Signed)
The focus of this group is to help patients establish daily goals to achieve during treatment and discuss how the patient can incorporate goal setting into their daily lives to aide in recovery.  Pt did not attend group 

## 2021-05-07 NOTE — Progress Notes (Signed)
   Writer assumed responsibility of this patient at 0130.  D:  Pt asleep in room.  Pt's breathing in easy and unlabored. A:  Pt remains on 1:1 while awake for safety. R:  Pt remains safe on unit with Q 15 minute safety checks.

## 2021-05-07 NOTE — Progress Notes (Addendum)
D: Patient denies SI/HI/AVH. Patient denied anxiety and rated depression 3/10. Pt out in open areas watching tv and talking on the phone. A:  Patient took scheduled medicine.  Support and encouragement provided Routine safety checks conducted every 15 minutes. Patient  Informed to notify staff with any concerns.   R:  Safety maintained.   @ 1739 1:1 Dc'd pt. Has been calm and cooperative all day. Pt. Has gone to the cafeteria for meals and went outside for group.

## 2021-05-07 NOTE — Progress Notes (Addendum)
1:1 Progress notes  @0750  Pt in bed sleeping. Safety maintained.    @0956  Pt in day room watching tv and talking with peers. MHT present. Safety maintained.   @1230  Pt. Went to cafeteria today with MHT. Safety maintained.   @1738  1:1 discontinued. Pt. Went to cafeteria with MHT present

## 2021-05-08 DIAGNOSIS — Z7251 High risk heterosexual behavior: Secondary | ICD-10-CM

## 2021-05-08 NOTE — BHH Group Notes (Signed)
Patient stated she wants to make good choices in order to  to turn her life around as a long term goal. Processed with patient about self-actualization. Safety needs, physiological needs, self-esteem needs and love and belong needs.. Explained to her that she should  identify her needs in the order of importance. Once  she can does that, she will understand the reason why self actualization is necessary as a way of turning  her life around and have a plosive impact on her life.

## 2021-05-08 NOTE — Group Note (Signed)
Recreation Therapy Group Note   Group Topic:Team Building  Group Date: 05/08/2021 Start Time: 1010 End Time: 1050 Facilitators: Caroll Rancher, LRT/CTRS Location: [address] Dayroom  Goal Area(s) Addresses:  Patient will effectively work with peer towards shared goal.  Patient will identify skills used to make activity successful.  Patient will identify how skills used during activity can be applied to reach post d/c goals.   Group Description:Tallest [employer]. In teams of 5-6, patients were given 25 small craft pipe cleaners. Using the materials provided, patients were instructed to compete againt the opposing team(s) to build the tallest free-standing structure from floor level. The activity was timed; difficulty increased by Clinical research associate as Production designer, theatre/television/film continued.  Systematically resources were removed with additional directions for example, placing one arm behind their back, working in silence, and shape stipulations. LRT facilitated post-activity discussion reviewing team processes and necessary communication skills involved in completion. Patients were encouraged to reflect how the skills utilized, or not utilized, in this activity can be incorporated to positively impact support systems post discharge.  Affect/Mood: Appropriate   Participation Level: Engaged   Participation Quality: Independent   Behavior: Appropriate and Attentive    Speech/Thought Process: Focused   Insight: Good   Judgement: Good   Modes of Intervention: Problem-solving   Patient Response to Interventions:  Attentive and Engaged   Education Outcome:  Acknowledges education and In group clarification offered    Clinical Observations/Individualized Feedback: Pt was focused on completing activity and worked well with peer in Administrator a concept and working it.  Pt expressed she and her partner used brain power to do the activity.  Pt went on to say, with your support system, you have come up  with a plan first before working it.    Plan: Continue to engage patient in RT group sessions 2-3x/week.   Caroll Rancher, LRT/CTRS 05/08/2021 12:20 PM

## 2021-05-08 NOTE — Progress Notes (Signed)
D Patient c/o feeling anxious 8/10 at 2110, PRN vistaril 25 mg PO given. Denies SI/HI/A/VH and verbally contracted for safety.   A Scheduled medications administered per Provider order. Support and encouragement provided. Routine safety checks conducted every 15 minutes. Patient notified to inform staff with problems or concerns.  R. No adverse drug reactions noted. Patient contracts for safety at this time. Will continue to monitor.

## 2021-05-08 NOTE — Progress Notes (Addendum)
Pt is alert and oriented to person, place, time and situation. Pt is calm, cooperative, denies SI/HI/AVH. Pt denies depression and anxiety. Pt's affect is flat, pt is quiet, guarded, answers assessment questions appropriately but briefly, makes fair eye contact. Pt is medication complaint. No distress noted, none reported. Will continue to monitor pt per Q15 minute face checks and monitor for safety and progress.   Pt c/o of left lower abdominal lump denies pain or discomfort, reports she noticed it while taking a shower. Pt reports normal bowel movements today. PT reports she told the MD was observed reporting this to MD in hallway. Pt reports no other GI symptoms, passed this above information along in shift report. Pt instructed to come report to staff if it gets worse or becomes painful. Will continue to monitor pt per Q15 minute face checks and monitor for safety and progress.

## 2021-05-08 NOTE — Plan of Care (Signed)
  Problem: Education: Goal: Knowledge of the prescribed therapeutic regimen will improve Outcome: Progressing   Problem: Education: Goal: Mental status will improve Outcome: Progressing   Problem: Education: Goal: Utilization of techniques to improve thought processes will improve Outcome: Not Progressing
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
## 2021-05-08 NOTE — Progress Notes (Signed)
The patient attended the majority of the A.A.evening.

## 2021-05-08 NOTE — Progress Notes (Signed)
     05/08/21 2000  Psych Admission Type (Psych Patients Only)  Admission Status Involuntary  Psychosocial Assessment  Patient Complaints Anxiety;Worrying  Eye Contact Avertive;Brief  Facial Expression Anxious;Sullen;Sad;Worried  Affect Anxious  Speech Logical/coherent  Interaction Attention-seeking  Motor Activity Slow  Appearance/Hygiene Unremarkable  Behavior Characteristics Anxious  Mood Depressed;Anxious  Thought Process  Coherency Concrete thinking  Content Blaming self  Delusions Persecutory  Perception WDL  Hallucination None reported or observed  Judgment Poor  Confusion None  Danger to Self  Current suicidal ideation? Denies  Danger to Others  Danger to Others None reported or observed

## 2021-05-08 NOTE — Progress Notes (Signed)
   At approximately 2100, spoke with provider on the unit.  Per provider will alert morning provider to follow up with pt complaint.

## 2021-05-08 NOTE — Progress Notes (Signed)
Pt reports, Right lower abdominal knot that is not hurting but is uncomfortable and hurts when she presses on it.    Pt states, " I spoke with several people today and a doctor who told me that the doctor tonight would look into it further."  Writer assessed to feel for it and could not identify a knot.  Pt responded saying one of her peers felt it too.   Writer explained that I am not denying what she is saying but, I personally did not feel it.  Advised pt that Message would be sent to provider on call.

## 2021-05-08 NOTE — Group Note (Signed)
LCSW Group Therapy Note  Group Date: 05/08/2021 Start Time: 1300 End Time: 1345   Type of Therapy and Topic:  Group Therapy - Healthy vs Unhealthy Coping Skills  Participation Level:  Active   Description of Group The focus of this group was to determine what unhealthy coping techniques typically are used by group members and what healthy coping techniques would be helpful in coping with various problems. Patients were guided in becoming aware of the differences between healthy and unhealthy coping techniques. Patients were asked to identify 2-3 healthy coping skills they would like to learn to use more effectively.  Therapeutic Goals Patients learned that coping is what human beings do all day long to deal with various situations in their lives Patients defined and discussed healthy vs unhealthy coping techniques Patients identified their preferred coping techniques and identified whether these were healthy or unhealthy Patients determined 2-3 healthy coping skills they would like to become more familiar with and use more often. Patients provided support and ideas to each other  Therapeutic Modalities Cognitive Behavioral Therapy Motivational Interviewing  Chrys Racer 05/08/2021  2:33 PM

## 2021-05-09 MED ORDER — LORAZEPAM 2 MG/ML IJ SOLN: 1.0000 mg | Freq: Four times a day (QID) | INTRAMUSCULAR | Status: DC | PRN

## 2021-05-09 MED ORDER — LORAZEPAM 1 MG PO TABS: 1.0000 mg | ORAL_TABLET | Freq: Four times a day (QID) | ORAL | Status: DC | PRN
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
## 2021-05-09 NOTE — [facility] Group Notes (Signed)
[facility] Group Notes:  (Nursing/MHT/Case Management/Adjunct)  Date:  05/09/2021  Time:  8:30 am  Type of Therapy:  Group Therapy  Participation Level:  Active  Participation Quality:  Appropriate  Affect:  Appropriate  Cognitive:  Appropriate  Insight:  Appropriate  Engagement in Group:  Engaged  Modes of Intervention:  Discussion  Summary of Progress/Problems:Discharge planning. Progressing   Reymundo Poll 05/09/2021, 11:05 AM
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
## 2021-05-09 NOTE — Group Note (Deleted)
LCSW Group Therapy Note  Group Date: 05/09/2021 Start Time: 1015 End Time: 1115   Type of Therapy and Topic:  Group Therapy: Anger Cues and Responses  Participation Level:  {[facility] PARTICIPATION LEVEL:22264}   Description of Group:   In this group, patients learned how to recognize the physical, cognitive, emotional, and behavioral responses they have to anger-provoking situations.  They identified a recent time they became angry and how they reacted.  They analyzed how their reaction was possibly beneficial and how it was possibly unhelpful.  The group discussed a variety of healthier coping skills that could help with such a situation in the future.  Focus was placed on how helpful it is to recognize the underlying emotions to our anger, because working on those can lead to a more permanent solution as well as our ability to focus on the important rather than the urgent.  Therapeutic Goals: 1. Patients will remember their last incident of anger and how they felt emotionally and physically, what their thoughts were at the time, and how they behaved. 2. Patients will identify how their behavior at that time worked for them, as well as how it worked against them. 3. Patients will explore possible new behaviors to use in future anger situations. 4. Patients will learn that anger itself is normal and cannot be eliminated, and that healthier reactions can assist with resolving conflict rather than worsening situations.  Summary of Patient Progress:  *** was active during the group. ***he shared a recent occurrence wherein feeling *** led to anger. ***he demonstrated *** insight into the subject matter, was respectful of peers, and participated throughout the entire session.  Therapeutic Modalities:   Cognitive Behavioral Therapy    Pranika Finks J Grossman-Orr, LCSWA 05/09/2021  12:13 PM    

## 2021-05-09 NOTE — Progress Notes (Signed)
[facility] Group Notes:  (Nursing/MHT/Case Management/Adjunct)  Date:  05/09/2021  Time:  2015 Type of Therapy:   wrap up group  Participation Level:  Active  Participation Quality:  Appropriate, Attentive, Sharing, and Supportive  Affect:  Appropriate  Cognitive:  Alert  Insight:  Improving  Engagement in Group:  Engaged  Modes of Intervention:  Clarification, Education, and Support  Summary of Progress/Problems: Positive thinking and positive change were discussed. Pt shared plans to find housing and establish stronger boundaries with the father of her child. Pt is grateful for the current roof over her head and for her 67 year old daughter.   Sharon Ward S 05/09/2021, 9:07 PM

## 2021-05-09 NOTE — Progress Notes (Addendum)
   05/09/21 1100  Psych Admission Type (Psych Patients Only)  Admission Status Involuntary  Psychosocial Assessment  Patient Complaints Anxiety;Worrying  Eye Contact Brief  Facial Expression Anxious;Worried  Affect Anxious  Surveyor, minerals Activity Slow  Appearance/Hygiene Unremarkable  Behavior Characteristics Cooperative;Anxious  Mood Anxious  Thought Process  Coherency Concrete thinking  Content WDL  Delusions None reported or observed  Perception WDL  Hallucination None reported or observed  Judgment Poor  Confusion None  Danger to Self  Current suicidal ideation? Denies  Danger to Others  Danger to Others None reported or observed  D. Pt presented as anxious, expressing concern that she may be pregnant. Pt was encouraged after being reminded that lab work will be done tomorrow am. Per pt's self inventory, pt rated her depression, hopelessness and anxiety all 0's today.Pt has been visible in the milieu interacting appropriately with peers and staff, and observed attending groups. Pt currently denies SI/HI and AVH and agrees to contact staff before acting on any harmful thoughts.  A. Labs and vitals monitored. Pt given and educated on medications. Pt supported emotionally and encouraged to express concerns and ask questions.   R. Pt remains safe with 15 minute checks. Will continue POC.

## 2021-05-09 NOTE — [facility] Group Notes (Signed)
[facility] Group Notes:  (Nursing/MHT/Case Management/Adjunct)  Date:  05/09/2021  Time:  2:20 am  Type of Therapy:  Psychoeducational Skills  Participation Level:  Active  Participation Quality:  Appropriate  Affect:  Appropriate  Cognitive:  Appropriate  Insight:  Appropriate  Engagement in Group:  Engaged  Modes of Intervention:  Education  Summary of Progress/Problems:Anger Management.Progressing.  Sharon Ward 05/09/2021, 3:12 PM

## 2021-05-09 NOTE — Group Note (Signed)
LCSW Group Therapy Note 05/09/2021  10:15am-11:15am  Type of Therapy and Topic:  Group Therapy: Anger and Commonalities  Participation Level:  Active   Description of Group: In this group, patients initially identified something that consistently makes them angry.  In doing this, they were able to see they have much in common with other patients.  We discussed possible underlying emotions that lead to this anger, which was explained to be the secondary emotion.  We also discussed cognitions that might lead correctly or incorrectly to underlying feelings that then lead to anger.  Many examples were shared by group members.  Commonalities among group members were pointed out throughout the entirety of group.  Also pointed out numerous times is that the targets of a person's anger also have underlying cognitions and emotions that could explain their reactions/anger.  Therapeutic Goals: Patients will be able to identify one thing that consistently angers them and why. Patients will understand the commonalities they have with others in terms of their anger triggers.   Patients will learn that anger itself is a secondary emotion and will think about possible primary emotions that could influence their anger. Patients will learn that cognitions can be correct or incorrect and often lead to the emotions that then lead to anger.     Summary of Patient Progress:  The patient shared that one of his/her causes of anger is being mistreated or seeing someone else mistreated.  She spoke a great deal about the rejection of her family throughout group and was somewhat monopolizing, although the interest level in the group remained high so CSW did not try to redirect her.  She was able to handle when someone in the group disagreed with her, was respectful in response.  She received a lot of support, though, from people who agreed with her viewpoint.  Therapeutic Modalities:   Cognitive Behavioral  Therapy  Lynnell Chad, LCSW

## 2021-05-10 ENCOUNTER — Other Ambulatory Visit (HOSPITAL_COMMUNITY): Payer: Self-pay | Admitting: Psychiatry

## 2021-05-10 LAB — TSH: TSH: 7.349 u[IU]/mL — ABNORMAL HIGH (ref 0.350–4.500)

## 2021-05-10 LAB — T4, FREE: Free T4: 0.79 ng/dL (ref 0.61–1.12)

## 2021-05-10 LAB — HCG, QUANTITATIVE, PREGNANCY: hCG, Beta Chain, Quant, S: <1 m[IU]/mL (ref <5)

## 2021-05-10 MED ORDER — NICOTINE 21 MG/24HR TD PT24: 21.0000 mg | MEDICATED_PATCH | Freq: Every day | TRANSDERMAL | 0 refills | Status: DC

## 2021-05-10 MED ORDER — OLANZAPINE 10 MG PO TABS: 10.0000 mg | ORAL_TABLET | ORAL | 0 refills | Status: DC

## 2021-05-10 MED ORDER — FLUOXETINE HCL 10 MG PO CAPS: 10.0000 mg | ORAL_CAPSULE | Freq: Every day | ORAL | 0 refills | Status: DC
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
## 2021-05-10 NOTE — [facility] Group Notes (Signed)
[facility] Group Notes:  (Nursing/MHT/Case Management/Adjunct)  Date:  05/10/2021  Time:  2:30 pm  Type of Therapy:  Psychoeducational Skills  Participation Level:  Active  Participation Quality:  Appropriate  Affect:  Appropriate  Cognitive:  Appropriate  Insight:  Appropriate  Engagement in Group:  Engaged  Modes of Intervention:  Education  Summary of Progress/Problems:Educated client on why self-care is important to over all well-being. Provide patient with work sheet to help her assess her needs for  this kind of care that would eventually make a big difference in her life.  Reymundo Poll 05/10/2021, 5:02 PM

## 2021-05-10 NOTE — Progress Notes (Addendum)
   05/10/21 1000  Psych Admission Type (Psych Patients Only)  Admission Status Involuntary  Psychosocial Assessment  Patient Complaints Worrying  Eye Contact Fair  Facial Expression Anxious  Affect Anxious  Speech Logical/coherent  Interaction Assertive  Motor Activity Other (Comment) (WDL)  Appearance/Hygiene Unremarkable  Behavior Characteristics Cooperative  Mood Anxious;Pleasant  Thought Process  Coherency Concrete thinking  Content WDL  Delusions None reported or observed  Perception WDL  Hallucination None reported or observed  Judgment Poor  Confusion None  Danger to Self  Current suicidal ideation? Denies  Danger to Others  Danger to Others None reported or observed   D. Pt has been calm and cooperative on the unit- observed interacting appropriately with peers and staff, and attending groups. Pt rated her depression, hopelessness and anxiety all 0's today. Pt reported that her goal today was "to use positive coping skills when I feel stressed or upset". Pt currently denies SI/HI and AVH .  A. Labs and vitals monitored. Pt given and educated on medications. Pt supported emotionally and encouraged to express concerns and ask questions.   R. Pt remains safe with 15 minute checks. Will continue POC.

## 2021-05-10 NOTE — Progress Notes (Signed)
[facility] Group Notes:  (Nursing/MHT/Case Management/Adjunct)  Date:  05/10/2021  Time:  2015  Type of Therapy:   wrap up group  Participation Level:  Active  Participation Quality:  Appropriate  Affect:  Appropriate  Cognitive:  Alert  Insight:  Improving  Engagement in Group:  Engaged  Modes of Intervention:  Clarification, Education, and Support  Summary of Progress/Problems: Positive thinking and self-care were discussed.   Marcille Buffy 05/10/2021, 8:44 PM

## 2021-05-10 NOTE — Progress Notes (Signed)
EKG results placed on the outside of pt's shadow chart  Normal sinus rhythm Normal ECG  QT/Qtc - Baz-  442/467 ms
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
## 2021-05-10 NOTE — [facility] Group Notes (Signed)
[facility] Group Notes:  (Nursing/MHT/Case Management/Adjunct)  Date:  05/10/2021  Time:  8;40 am Type of Therapy:  Group Therapy  Participation Level:  Active  Participation Quality:  Attentive  Affect:  Appropriate  Cognitive:  Appropriate  Insight:  Appropriate  Engagement in Group:  Engaged  Modes of Intervention:  Discussion  Summary of Progress/Problems: Expressed regret for her behavior during  the night. Was able to re-direct herself  better than in the past.  Reymundo Poll 05/10/2021, 9:48 AM
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
## 2021-05-10 NOTE — Progress Notes (Signed)
   05/10/21 2213  Psych Admission Type (Psych Patients Only)  Admission Status Involuntary  Psychosocial Assessment  Patient Complaints Anxiety  Eye Contact Fair  Facial Expression Anxious  Affect Appropriate to circumstance  Speech Logical/coherent  Interaction Assertive  Motor Activity Other (Comment) (WDL)  Appearance/Hygiene Unremarkable  Behavior Characteristics Appropriate to situation  Mood Anxious;Pleasant  Thought Process  Coherency Concrete thinking  Content WDL  Delusions None reported or observed  Perception WDL  Hallucination None reported or observed  Judgment Poor  Confusion None  Danger to Self  Current suicidal ideation? Denies  Danger to Others  Danger to Others None reported or observed
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
## 2021-05-11 ENCOUNTER — Encounter (HOSPITAL_COMMUNITY): Payer: Self-pay

## 2021-05-11 NOTE — Final Progress Note (Signed)
Discharge Note:  Patient denies SI/HI AVH at this time. Discharge instructions, AVS, prescriptions and transition record gone over with patient. Patient agrees to comply with medication management, follow-up visit, and outpatient therapy. Patient belongings returned to patient. Patient questions and concerns addressed and answered.  Patient ambulatory off unit.  Patient discharged to home with friend.   

## 2021-05-11 NOTE — Telephone Encounter (Signed)
Spoke with pharmacist with alternate dose and instructions for this medication

## 2021-05-11 NOTE — Progress Notes (Signed)
  [facility] Adult Case Management Discharge Plan :  Will you be returning to the same living situation after discharge:  No. At discharge, do you have transportation home?: No. Do you have the ability to pay for your medications: Yes,  Medicaid  Release of information consent forms completed and in the chart;  Patient's signature needed at discharge.  Patient to Follow up at:  Follow-up Information     [facility]. Go to.   Specialty: Behavioral Health Why: Please go to this provider for therapy and medication management services during walk in hours:  Monday through Wednesday, from 7:45 am to 11:00 am.  Services are provided on a first come, first served basis.  Please arrive early. Contact information: [address] [place] [address] [phone]        [facility] Follow up.   Specialty: Professional Counselor Why: You may contact this facility to learn more about their anger managment groups. Contact information: [facility] [place] [address] [place] [address] [phone]         [facility]. Schedule an appointment as soon as possible for a visit.   Why: Please call to schedule an appointment with this provider to establish care for primary care services. Contact information: [address], [address] [place] [address]                Next level of care provider has access to [facility] [employer] and Suicide Prevention discussed: Yes,  w/ pt     Has patient been referred to the Quitline?: N/A patient is not a smoker  Patient has been referred for addiction treatment: N/A  Felizardo Hoffmann, Theresia Majors 05/11/2021, 9:38 AM

## 2021-05-11 NOTE — BH IP Treatment Plan (Signed)
Interdisciplinary Treatment and Diagnostic Plan Update  05/11/2021 Time of Session: 9:00am Sharon Ward MRN: [id number]  Principal Diagnosis: Bipolar affective disorder, current episode depressed (HCC)  Secondary Diagnoses: Principal Problem:   Bipolar affective disorder, current episode depressed (HCC) Active Problems:   Anxiety   Suicide attempt (HCC)   Marijuana abuse   History of unprotected sex   Borderline personality disorder (HCC)   PTSD (post-traumatic stress disorder)   Current Medications:  Current Facility-Administered Medications  Medication Dose Route Frequency Provider Last Rate Last Admin   acetaminophen (TYLENOL) tablet 650 mg  650 mg Oral Q6H PRN Thermon Leyland, NP   650 mg at 05/06/21 0806   alum & mag hydroxide-simeth (MAALOX/MYLANTA) 200-200-20 MG/5ML suspension 30 mL  30 mL Oral Q4H PRN Thermon Leyland, NP       diphenhydrAMINE (BENADRYL) capsule 50 mg  50 mg Oral Q6H PRN Mariel Craft, MD   50 mg at 05/06/21 1222   Or   diphenhydrAMINE (BENADRYL) injection 50 mg  50 mg Intramuscular Q6H PRN Mariel Craft, MD       FLUoxetine (PROZAC) capsule 10 mg  10 mg Oral Daily Karsten Ro, MD   10 mg at 05/11/21 5427   hydrOXYzine (ATARAX/VISTARIL) tablet 25 mg  25 mg Oral TID PRN Laveda Abbe, NP   25 mg at 05/08/21 1953   LORazepam (ATIVAN) tablet 1 mg  1 mg Oral Q6H PRN Ajibola, Ene A, NP       Or   LORazepam (ATIVAN) injection 1 mg  1 mg Intramuscular Q6H PRN Ajibola, Ene A, NP       magnesium hydroxide (MILK OF MAGNESIA) suspension 30 mL  30 mL Oral Daily PRN Thermon Leyland, NP       nicotine (NICODERM CQ - dosed in mg/24 hours) patch 21 mg  21 mg Transdermal Daily Laveda Abbe, NP   21 mg at 05/10/21 0924   OLANZapine (ZYPREXA) tablet 10 mg  10 mg Oral Bobby Rumpf, MD   10 mg at 05/11/21 0806   OLANZapine zydis (ZYPREXA) disintegrating tablet 10 mg  10 mg Oral Q8H PRN Laveda Abbe, NP   10 mg at 05/05/21 1338   And    ziprasidone (GEODON) injection 20 mg  20 mg Intramuscular PRN Laveda Abbe, NP       traZODone (DESYREL) tablet 50 mg  50 mg Oral QHS PRN Laveda Abbe, NP   50 mg at 05/10/21 2127   PTA Medications: Medications Prior to Admission  Medication Sig Dispense Refill Last Dose   ferrous sulfate 325 (65 FE) MG EC tablet Take 1 tablet (325 mg total) by mouth at bedtime. For anemia  0    lurasidone (LATUDA) 20 MG TABS tablet Take 3 tablets (60 mg total) by mouth daily after supper. For mood control 90 tablet 0    sertraline (ZOLOFT) 100 MG tablet Take 1 tablet (100 mg total) by mouth at bedtime. For depression 30 tablet 0    SPRINTEC 28 0.25-35 MG-MCG tablet Take 1 tablet by mouth daily. (Patient taking differently: Take 1 tablet by mouth at bedtime.) 28 tablet 11     Patient Stressors:    Patient Strengths:    Treatment Modalities: Medication Management, Group therapy, Case management,  1 to 1 session with clinician, Psychoeducation, Recreational therapy.   Physician Treatment Plan for Primary Diagnosis: Bipolar affective disorder, current episode depressed (HCC) Long Term Goal(s): Improvement in symptoms so as ready  for discharge   Short Term Goals: Ability to identify changes in lifestyle to reduce recurrence of condition will improve Ability to verbalize feelings will improve Ability to disclose and discuss suicidal ideas Ability to demonstrate self-control will improve Ability to identify and develop effective coping behaviors will improve Ability to maintain clinical measurements within normal limits will improve Compliance with prescribed medications will improve Ability to identify triggers associated with substance abuse/mental health issues will improve  Medication Management: Evaluate patient's response, side effects, and tolerance of medication regimen.  Therapeutic Interventions: 1 to 1 sessions, Unit Group sessions and Medication  administration.  Evaluation of Outcomes: Adequate for Discharge  Physician Treatment Plan for Secondary Diagnosis: Principal Problem:   Bipolar affective disorder, current episode depressed (HCC) Active Problems:   Anxiety   Suicide attempt (HCC)   Marijuana abuse   History of unprotected sex   Borderline personality disorder (HCC)   PTSD (post-traumatic stress disorder)  Long Term Goal(s): Improvement in symptoms so as ready for discharge   Short Term Goals: Ability to identify changes in lifestyle to reduce recurrence of condition will improve Ability to verbalize feelings will improve Ability to disclose and discuss suicidal ideas Ability to demonstrate self-control will improve Ability to identify and develop effective coping behaviors will improve Ability to maintain clinical measurements within normal limits will improve Compliance with prescribed medications will improve Ability to identify triggers associated with substance abuse/mental health issues will improve     Medication Management: Evaluate patient's response, side effects, and tolerance of medication regimen.  Therapeutic Interventions: 1 to 1 sessions, Unit Group sessions and Medication administration.  Evaluation of Outcomes: Adequate for Discharge   RN Treatment Plan for Primary Diagnosis: Bipolar affective disorder, current episode depressed (HCC) Long Term Goal(s): Knowledge of disease and therapeutic regimen to maintain health will improve  Short Term Goals: Ability to remain free from injury will improve, Ability to verbalize frustration and anger appropriately will improve, Ability to demonstrate self-control, Ability to identify and develop effective coping behaviors will improve, and Compliance with prescribed medications will improve  Medication Management: RN will administer medications as ordered by provider, will assess and evaluate patient's response and provide education to patient for prescribed  medication. RN will report any adverse and/or side effects to prescribing provider.  Therapeutic Interventions: 1 on 1 counseling sessions, Psychoeducation, Medication administration, Evaluate responses to treatment, Monitor vital signs and CBGs as ordered, Perform/monitor CIWA, COWS, AIMS and Fall Risk screenings as ordered, Perform wound care treatments as ordered.  Evaluation of Outcomes: Adequate for Discharge   LCSW Treatment Plan for Primary Diagnosis: Bipolar affective disorder, current episode depressed (HCC) Long Term Goal(s): Safe transition to appropriate next level of care at discharge, Engage patient in therapeutic group addressing interpersonal concerns.  Short Term Goals: Engage patient in aftercare planning with referrals and resources, Increase social support, Increase ability to appropriately verbalize feelings, Identify triggers associated with mental health/substance abuse issues, and Increase skills for wellness and recovery  Therapeutic Interventions: Assess for all discharge needs, 1 to 1 time with Social worker, Explore available resources and support systems, Assess for adequacy in community support network, Educate family and significant other(s) on suicide prevention, Complete Psychosocial Assessment, Interpersonal group therapy.  Evaluation of Outcomes: Adequate for Discharge   Progress in Treatment: Attending groups: Yes. Participating in groups: Yes. and No. Taking medication as prescribed: Yes. Toleration medication: Yes. Family/Significant other contact made: No, will contact:  declined consents Patient understands diagnosis: Yes. Discussing patient identified problems/goals with  staff: Yes. Medical problems stabilized or resolved: Yes. Denies suicidal/homicidal ideation: Yes. Issues/concerns per patient self-inventory: No.   New problem(s) identified: No, Describe:  none  New Short Term/Long Term Goal(s): medication stabilization, elimination of SI  thoughts, development of comprehensive mental wellness plan.    Patient Goals:  "Not call him"  Discharge Plan or Barriers: Pt is to follow up with [facility] for medication management and therapy at discharge.  Reason for Continuation of Hospitalization: Medication stabilization  Estimated Length of Stay: 1-3 days   Scribe for Treatment Team: Otelia Santee, LCSW 05/11/2021 9:20 AM

## 2021-05-11 NOTE — Progress Notes (Signed)
   05/11/21 0614  Vital Signs  Temp 98.2 F (36.8 C)  Temp Source Oral  Pulse Rate 67  Pulse Rate Source Monitor  BP (!) 121/52  BP Location Right Arm  BP Method Automatic  Patient Position (if appropriate) Sitting  Oxygen Therapy  SpO2 100 %   D: Patient denies SI/HI/AVH. Pt. Denies anxiety and depression. Pt. Asked about being discharged.  A:  Patient took scheduled medicine.  Support and encouragement provided Routine safety checks conducted every 15 minutes. Patient  Informed to notify staff with any concerns.   R:  Safety maintained.

## 2021-05-12 LAB — T3, FREE: T3, Free: 3.6 pg/mL (ref 2.0–4.4)

## 2021-06-29 ENCOUNTER — Ambulatory Visit (HOSPITAL_COMMUNITY)
Admission: RE | Admit: 2021-06-29 | Discharge: 2021-06-29 | Disposition: A | Discharge status: Home / Self Care | Payer: Medicaid Other | Attending: Psychiatry | Admitting: Psychiatry
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
## 2021-07-17 ENCOUNTER — Ambulatory Visit (HOSPITAL_COMMUNITY)
Admission: RE | Admit: 2021-07-17 | Discharge: 2021-07-17 | Disposition: A | Discharge status: Home / Self Care | Payer: Medicaid Other | Attending: Psychiatry | Admitting: Psychiatry
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
## 2021-08-09 NOTE — L&D Delivery Note (Cosign Needed Addendum)
OB/GYN Faculty Practice Delivery Note  Sharon Ward is a 26 y.o. G3P1011 s/p VD at [redacted]w[redacted]d. She was admitted for IOL gHTN.   ROM: 1h 50m with clear fluid GBS Status:  Positive/-- (09/27 1639)  Labor Progress: Initial SVE: 1.5/thick/-3. She then progressed to complete.   Delivery Date/Time: 05/29/22  Delivery: Called to room and patient was complete and pushing. Head delivered ROA. No nuchal cord present. Shoulder and body delivered in usual fashion. Infant with spontaneous cry, placed on mother's abdomen, dried and stimulated. Cord clamped x 2 after 1-minute delay, and cut by friend. Cord blood drawn. Placenta delivered spontaneously with gentle cord traction. Fundus firm with massage and Pitocin. Labia, perineum, vagina, and cervix inspected with bilateral periurethral lacerations noted and hemostatic.  Baby Weight: pending  Placenta: 3 vessel, intact. Sent to L&D Complications: None Lacerations: bilateral periurethral EBL: 82 mL Analgesia: Epidural   Infant:  APGAR (1 MIN):  9 APGAR (5 MINS):  [place], DO OB Family Medicine Fellow, [facility] for [facility], [facility] 05/29/2022, 8:27 PM

## 2021-09-21 ENCOUNTER — Other Ambulatory Visit: Payer: Self-pay

## 2021-09-21 ENCOUNTER — Ambulatory Visit (INDEPENDENT_AMBULATORY_CARE_PROVIDER_SITE_OTHER): Payer: Medicaid Other | Admitting: Obstetrics and Gynecology

## 2021-09-21 ENCOUNTER — Encounter: Payer: Self-pay | Admitting: Obstetrics and Gynecology

## 2021-09-21 VITALS — BP 127/72 | HR 92 | Ht 63.0 in | Wt 204.7 lb

## 2021-09-21 DIAGNOSIS — N889 Noninflammatory disorder of cervix uteri, unspecified: Secondary | ICD-10-CM

## 2021-09-21 DIAGNOSIS — N912 Amenorrhea, unspecified: Secondary | ICD-10-CM | POA: Diagnosis not present

## 2021-09-21 DIAGNOSIS — Z3202 Encounter for pregnancy test, result negative: Secondary | ICD-10-CM | POA: Diagnosis not present

## 2021-09-21 LAB — POCT PREGNANCY, URINE: Preg Test, Ur: NEGATIVE

## 2021-09-21 NOTE — Progress Notes (Signed)
Obstetrics and Gynecology Visit Return Patient Evaluation  Appointment Date: 09/21/2021  Primary Care Provider: Pcp, No  OBGYN Clinic: [name]  Chief Complaint: knot in vaginal felt during intercourse. Period is one week late  History of Present Illness:  Sharon Ward is a 25 y.o. P1 with above CC. Patient states that her partner felt a knot in her vagina yesterday during intercourse and she states that she also had discomfort during intercourse.  Patient states this has been going on since her IUD insertion in early 2022; pt had a paragard IUD placed 09/2020 but then had heavy bleeding and was seen on 2/7 and IUD stem seen and IUD removed and u/s ordered to assess for any uterine anomaly; ultrasound was normal. She had another paragard placed 11/2020 and then had it removed in June due to heavy bleeding. Last STD swab was 9/21/222 and it only showed BV.   She has not taken a home UPT. Periods are qmonth and regular.   Review of Systems: as noted in the History of Present Illness.   Patient Active Problem List   Diagnosis Date Noted   History of unprotected sex 05/08/2021   Marijuana abuse 05/05/2021   Bipolar affective disorder, current episode depressed ([place]) 05/04/2021   Bipolar 2 disorder, major depressive episode ([place]) 02/05/2021   Intentional drug overdose ([place]) 11/03/2020   Suicide attempt ([place]) 11/02/2020   Prolonged QT interval 11/02/2020   Anxiety 09/18/2019   LGSIL on Pap smear of cervix 09/18/2019   Oppositional defiant disorder 09/18/2019   Seasonal allergies 09/18/2019   Borderline personality disorder ([place]) 10/24/2016   PTSD (post-traumatic stress disorder) 10/24/2016   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   Medications:  [name] had no medications administered during this visit. Current Outpatient Medications  Medication Sig Dispense Refill   FLUoxetine (PROZAC) 10 MG capsule Take 1 capsule (10 mg total) by mouth daily.  30 capsule 0   nicotine (NICODERM CQ - DOSED IN MG/24 HOURS) 21 mg/24hr patch Place 1 patch (21 mg total) onto the skin daily. 28 patch 0   OLANZapine (ZYPREXA) 10 MG tablet Take 1 tablet (10 mg total) by mouth 2 (two) times daily in the am and at bedtime.. 60 tablet 0   SPRINTEC 28 0.25-35 MG-MCG tablet Take 1 tablet by mouth daily. (Patient taking differently: Take 1 tablet by mouth at bedtime.) 28 tablet 11   No current facility-administered medications for this visit.    Allergies: is allergic to ascorbate, citrus, coconut flavor, lamotrigine, orange (diagnostic), peach flavor, pear, and pineapple.  Physical Exam:  BP 127/72    Pulse 92    Ht 5\' 3"  (1.6 m)    Wt 204 lb 11.2 oz (92.9 kg)    LMP 08/16/2021 (Exact Date)    BMI 36.26 kg/m  Body mass index is 36.26 kg/m. General appearance: Well nourished, well developed female in no acute distress.  Abdomen: diffusely non tender to palpation, non distended, and no masses, hernias Neuro/Psych:  Normal mood and affect.    Pelvic exam:  EGBUS: negative Vaginal vault: negative Cervix:  negative Bimanual: small, nttp, mobile uterus. Will cervical manipulation pt notes some discomfort  Labs: UPT neg  Assessment: pt stable  Plan:  Pt has had a 01/2020 SVD and she had a negative 09/2020 pelvic u/s. There is no obvious cervical prolapse or e/o CMT. I offered her a repeat STD swab but she declined. I told her I recommend pelvic floor PT but to try Kegel  exercises first for a few months and if no improvement to contact us for PT referral; pt is self pay.   I also encouraged communication with her partner re: various positions and what is and what is not comfortable, etc.   Pap neg 01/2021   Repeat UPT in a week. Pt told she can do at home or contact us and let us know the results no matter if positive or negative.   RTC: PRN  Durene Romans MD Attending Center for [facility])

## 2021-09-24 ENCOUNTER — Other Ambulatory Visit: Payer: Self-pay

## 2021-09-24 ENCOUNTER — Ambulatory Visit (INDEPENDENT_AMBULATORY_CARE_PROVIDER_SITE_OTHER): Payer: Medicaid Other | Admitting: Physician Assistant

## 2021-09-24 ENCOUNTER — Encounter (HOSPITAL_COMMUNITY): Payer: Self-pay | Admitting: Physician Assistant

## 2021-09-24 VITALS — BP 118/53 | HR 61 | Ht 63.0 in | Wt 205.0 lb

## 2021-09-24 DIAGNOSIS — F3181 Bipolar II disorder: Secondary | ICD-10-CM | POA: Diagnosis not present

## 2021-09-24 DIAGNOSIS — F3160 Bipolar disorder, current episode mixed, unspecified: Secondary | ICD-10-CM | POA: Insufficient documentation

## 2021-09-24 DIAGNOSIS — F411 Generalized anxiety disorder: Secondary | ICD-10-CM

## 2021-09-24 MED ORDER — OLANZAPINE 10 MG PO TABS: 10.0000 mg | ORAL_TABLET | ORAL | 2 refills | Status: DC

## 2021-09-24 MED ORDER — HYDROXYZINE HCL 25 MG PO TABS
25.0000 mg | ORAL_TABLET | Freq: Three times a day (TID) | ORAL | 1 refills | Status: DC | PRN
Start: 2021-09-24 — End: 2021-11-04

## 2021-09-24 MED ORDER — FLUOXETINE HCL 10 MG PO CAPS: 10.0000 mg | ORAL_CAPSULE | Freq: Every day | ORAL | 2 refills | Status: DC
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
## 2021-10-07 ENCOUNTER — Ambulatory Visit (INDEPENDENT_AMBULATORY_CARE_PROVIDER_SITE_OTHER): Payer: Medicaid Other

## 2021-10-07 ENCOUNTER — Encounter: Payer: Self-pay | Admitting: Family Medicine

## 2021-10-07 ENCOUNTER — Other Ambulatory Visit: Payer: Self-pay

## 2021-10-07 VITALS — BP 137/71 | HR 79 | Wt 207.9 lb

## 2021-10-07 DIAGNOSIS — Z3201 Encounter for pregnancy test, result positive: Secondary | ICD-10-CM | POA: Diagnosis not present

## 2021-10-07 LAB — POCT PREGNANCY, URINE: Preg Test, Ur: POSITIVE — AB

## 2021-10-07 NOTE — Patient Instructions (Signed)

## 2021-10-07 NOTE — Progress Notes (Signed)
Pt here today for pregnancy test resulting positive.  Pt reports LMP 08/16/21, 7w 3d, and EDD 05/23/22.  Pt denies vaginal bleeding and pain. Pt states that she does have headaches.  BP 137/71 LA via dynamap.  Pt provided with list of medications that is safe to take in pregnancy via AVS.  Pt advised to start prenatal care.  Pregnancy verification letter provided per pt request. ? ?Dreyton Roessner,RN  ?10/07/21 ?

## 2021-10-08 ENCOUNTER — Ambulatory Visit (HOSPITAL_COMMUNITY): Payer: Medicaid Other | Admitting: Psychiatry

## 2021-10-11 ENCOUNTER — Inpatient Hospital Stay (HOSPITAL_COMMUNITY)
Admission: AD | Admit: 2021-10-11 | Discharge: 2021-10-11 | Disposition: A | Discharge status: Home / Self Care | Payer: Medicaid Other | Source: Ambulatory Visit | Attending: Obstetrics and Gynecology | Admitting: Obstetrics and Gynecology

## 2021-10-11 ENCOUNTER — Inpatient Hospital Stay (HOSPITAL_COMMUNITY): Payer: Medicaid Other

## 2021-10-11 ENCOUNTER — Other Ambulatory Visit: Payer: Self-pay

## 2021-10-11 DIAGNOSIS — O26891 Other specified pregnancy related conditions, first trimester: Secondary | ICD-10-CM | POA: Diagnosis not present

## 2021-10-11 DIAGNOSIS — W1830XA Fall on same level, unspecified, initial encounter: Secondary | ICD-10-CM | POA: Diagnosis not present

## 2021-10-11 DIAGNOSIS — O26899 Other specified pregnancy related conditions, unspecified trimester: Secondary | ICD-10-CM | POA: Diagnosis not present

## 2021-10-11 DIAGNOSIS — Y92002 Bathroom of unspecified non-institutional (private) residence single-family (private) house as the place of occurrence of the external cause: Secondary | ICD-10-CM | POA: Insufficient documentation

## 2021-10-11 DIAGNOSIS — R109 Unspecified abdominal pain: Secondary | ICD-10-CM

## 2021-10-11 DIAGNOSIS — W19XXXA Unspecified fall, initial encounter: Secondary | ICD-10-CM | POA: Diagnosis not present

## 2021-10-11 DIAGNOSIS — Z3491 Encounter for supervision of normal pregnancy, unspecified, first trimester: Secondary | ICD-10-CM

## 2021-10-11 DIAGNOSIS — Z3A08 8 weeks gestation of pregnancy: Secondary | ICD-10-CM | POA: Diagnosis not present

## 2021-10-11 LAB — CBC
HCT: 39.3 % (ref 36.0–46.0)
Hemoglobin: 13.7 g/dL (ref 12.0–15.0)
MCH: 32.2 pg (ref 26.0–34.0)
MCHC: 34.9 g/dL (ref 30.0–36.0)
MCV: 92.5 fL (ref 80.0–100.0)
Platelets: 239 10*3/uL (ref 150–400)
RBC: 4.25 MIL/uL (ref 3.87–5.11)
RDW: 13.2 % (ref 11.5–15.5)
WBC: 8.6 10*3/uL (ref 4.0–10.5)
nRBC: 0 % (ref 0.0–0.2)

## 2021-10-11 LAB — URINALYSIS, ROUTINE W REFLEX MICROSCOPIC
Bilirubin Urine: NEGATIVE
Glucose, UA: NEGATIVE mg/dL
Hgb urine dipstick: NEGATIVE
Ketones, ur: NEGATIVE mg/dL
Nitrite: NEGATIVE
Protein, ur: NEGATIVE mg/dL
Specific Gravity, Urine: 1.018 (ref 1.005–1.030)
pH: 6 (ref 5.0–8.0)

## 2021-10-11 LAB — WET PREP, GENITAL
Clue Cells Wet Prep HPF POC: NONE SEEN
Sperm: NONE SEEN
Trich, Wet Prep: NONE SEEN
WBC, Wet Prep HPF POC: >=10 — AB (ref <10)
Yeast Wet Prep HPF POC: NONE SEEN

## 2021-10-11 LAB — HCG, QUANTITATIVE, PREGNANCY: hCG, Beta Chain, Quant, S: 59950 m[IU]/mL — ABNORMAL HIGH (ref <5)

## 2021-10-11 NOTE — Discharge Instructions (Signed)

## 2021-10-11 NOTE — MAU Provider Note (Addendum)
History     CSN: [id number]  Arrival date and time: 10/11/21 1527    Chief Complaint  Patient presents with   Fall   Abdominal Pain   Back Pain   HPI Sharon Ward is a 26 y.o. G4P1001 at [redacted]w[redacted]d who presents via EMS for evaluation after a fall in the shower. She states she was taking a bath and stood up to rinse off when she slipped and fell. She states she landed on her left side. She reports lower abdominal cramping and left sided cramping. She rates the pain a 2/10 and has not tried anything for the pain. She denies any bleeding or discharge.   OB History     Gravida  4   Para  1   Term  1   Preterm      AB      Living  1      SAB      IAB      Ectopic      Multiple  0   Live Births  1           Past Medical History:  Diagnosis Date   Asthma    Bipolar 1 disorder, mixed (HCC)    Depression    Generalized anxiety disorder    Relationship dysfunction     Past Surgical History:  Procedure Laterality Date   PILONIDAL CYST / SINUS EXCISION  09/11/2013   PILONIDAL CYST EXCISION  05/17/2014   Pilonidal cystectomy with cleft lip    Family History  Problem Relation Age of Onset   Healthy Mother    Healthy Father     Social History   Tobacco Use   Smoking status: Every Day    Packs/day: 1.00    Years: 15.00    Pack years: 15.00    Types: Cigarettes   Smokeless tobacco: Never   Tobacco comments:    only smoke a "couple" of cigarettes when stressed or anxious, socially with friends per [facility] chart  Vaping Use   Vaping Use: Never used  Substance Use Topics   Alcohol use: Not Currently    Comment: occasional prior to pregnancy   Drug use: Yes    Frequency: 4.0 times per week    Types: Marijuana    Comment: reports use 4 or 5 times; last used 05/04/19    Allergies:  Allergies  Allergen Reactions   Ascorbate Rash   Citrus Rash   Coconut Flavor Rash   Lamotrigine Rash   Orange (Diagnostic) Rash   Peach Flavor Rash   Pear Rash    Pineapple Rash    Medications Prior to Admission  Medication Sig Dispense Refill Last Dose   FLUoxetine (PROZAC) 10 MG capsule Take 1 capsule (10 mg total) by mouth daily. 30 capsule 2    hydrOXYzine (ATARAX) 25 MG tablet Take 1 tablet (25 mg total) by mouth 3 (three) times daily as needed. 75 tablet 1    nicotine (NICODERM CQ - DOSED IN MG/24 HOURS) 21 mg/24hr patch Place 1 patch (21 mg total) onto the skin daily. (Patient not taking: Reported on 10/07/2021) 28 patch 0    OLANZapine (ZYPREXA) 10 MG tablet Take 1 tablet (10 mg total) by mouth 2 (two) times daily in the am and at bedtime.. 60 tablet 2    Prenatal Vit-Fe Fumarate-FA (PREPLUS) 27-1 MG TABS Take 1 tablet by mouth daily.       Review of Systems  Constitutional: Negative.  Negative for fatigue and  fever.  HENT: Negative.    Respiratory: Negative.  Negative for shortness of breath.   Cardiovascular: Negative.  Negative for chest pain.  Gastrointestinal:  Positive for abdominal pain. Negative for constipation, diarrhea, nausea and vomiting.  Genitourinary: Negative.  Negative for dysuria, vaginal bleeding and vaginal discharge.  Musculoskeletal:  Positive for back pain.  Neurological: Negative.  Negative for dizziness and headaches.  Physical Exam   Blood pressure 120/60, pulse 77, temperature 98.3 F (36.8 C), resp. rate 16, last menstrual period 08/16/2021, SpO2 98 %, not currently breastfeeding.  Physical Exam Vitals and nursing note reviewed.  Constitutional:      General: She is not in acute distress.    Appearance: She is well-developed.  HENT:     Head: Normocephalic.  Eyes:     Pupils: Pupils are equal, round, and reactive to light.  Cardiovascular:     Rate and Rhythm: Normal rate and regular rhythm.     Heart sounds: Normal heart sounds.  Pulmonary:     Effort: Pulmonary effort is normal. No respiratory distress.     Breath sounds: Normal breath sounds.  Abdominal:     General: Bowel sounds are normal. There  is no distension.     Palpations: Abdomen is soft.     Tenderness: There is no abdominal tenderness.  Skin:    General: Skin is warm and dry.  Neurological:     Mental Status: She is alert and oriented to person, place, and time.  Psychiatric:        Mood and Affect: Mood normal.        Behavior: Behavior normal.        Thought Content: Thought content normal.        Judgment: Judgment normal.    MAU Course  Procedures Results for orders placed or performed during the hospital encounter of 10/11/21 (from the past 24 hour(s))  Urinalysis, Routine w reflex microscopic Cervix     Status: Abnormal   Collection Time: 10/11/21  3:52 PM  Result Value Ref Range   Color, Urine YELLOW YELLOW   APPearance HAZY (A) CLEAR   Specific Gravity, Urine 1.018 1.005 - 1.030   pH 6.0 5.0 - 8.0   Glucose, UA NEGATIVE NEGATIVE mg/dL   Hgb urine dipstick NEGATIVE NEGATIVE   Bilirubin Urine NEGATIVE NEGATIVE   Ketones, ur NEGATIVE NEGATIVE mg/dL   Protein, ur NEGATIVE NEGATIVE mg/dL   Nitrite NEGATIVE NEGATIVE   Leukocytes,Ua SMALL (A) NEGATIVE   RBC / HPF 0-5 0 - 5 RBC/hpf   WBC, UA 0-5 0 - 5 WBC/hpf   Bacteria, UA MANY (A) NONE SEEN   Squamous Epithelial / LPF 6-10 0 - 5   Mucus PRESENT    Ca Oxalate Crys, UA PRESENT   Wet prep, genital     Status: Abnormal   Collection Time: 10/11/21  3:52 PM   Specimen: Cervix  Result Value Ref Range   Yeast Wet Prep HPF POC NONE SEEN NONE SEEN   Trich, Wet Prep NONE SEEN NONE SEEN   Clue Cells Wet Prep HPF POC NONE SEEN NONE SEEN   WBC, Wet Prep HPF POC >=10 (A) <10   Sperm NONE SEEN   CBC     Status: None   Collection Time: 10/11/21  4:03 PM  Result Value Ref Range   WBC 8.6 4.0 - 10.5 K/uL   RBC 4.25 3.87 - 5.11 MIL/uL   Hemoglobin 13.7 12.0 - 15.0 g/dL   HCT 16.1 09.6 -  46.0 %   MCV 92.5 80.0 - 100.0 fL   MCH 32.2 26.0 - 34.0 pg   MCHC 34.9 30.0 - 36.0 g/dL   RDW 74.1 63.8 - 45.3 %   Platelets 239 150 - 400 K/uL   nRBC 0.0 0.0 - 0.2 %  hCG,  quantitative, pregnancy     Status: Abnormal   Collection Time: 10/11/21  4:03 PM  Result Value Ref Range   hCG, Beta Chain, Quant, S 59,950 (H) <5 mIU/mL    US OB LESS THAN 14 WEEKS WITH OB TRANSVAGINAL  Result Date: 10/11/2021 CLINICAL DATA:  Abdominal pain EXAM: OBSTETRIC <14 WK [place] AND TRANSVAGINAL OB US TECHNIQUE: Both transabdominal and transvaginal ultrasound examinations were performed for complete evaluation of the gestation as well as the maternal uterus, adnexal regions, and pelvic cul-de-sac. Transvaginal technique was performed to assess early pregnancy. COMPARISON:  None. FINDINGS: Intrauterine gestational sac: Single Yolk sac:  Visualized. Embryo:  Visualized. Cardiac Activity: Visualized. Heart Rate: 132 bpm CRL:  7.4 mm   6 w   4 d                  [place] EDC: 06/02/2022 Maternal uterus/adnexae: Subchorionic hemorrhage: Small subchorionic hematoma measures 0.6 x 0.3 cm. Right ovary: Normal Left ovary: Normal Other :None Free fluid:  None IMPRESSION: 1. Single living intrauterine gestation with estimated gestational age of [redacted] weeks and 4 days. 2. Small subchorionic hematoma noted. Electronically Signed   By: Signa Kell M.D.   On: 10/11/2021 17:03     MDM UA, UPT CBC, HCG ABO/Rh- O Pos Wet prep and gc/chlamydia US OB Comp Less 14 weeks with Transvaginal  Reviewed results of subchorionic hemorrhage with patient. Discussed that this is a common finding in the first trimester and does not usually cause problems in the pregnancy like loss or difficulty with development. Reviewed expectations for vaginal bleeding including a small amount possibly for several weeks. Reviewed warning signs of heavy bleeding, saturating a pad in less than an hour, and severe pain as reasons to come back to MAU. Encouraged patient to exercise pelvic rest until 7 days after bleeding stops. Patient verbalized understanding.    Assessment and Plan   1. Fall, initial encounter   2. Abdominal pain affecting  pregnancy   3. Normal intrauterine pregnancy on prenatal ultrasound in first trimester   4. [redacted] weeks gestation of pregnancy    -Discharge home in stable condition -First trimester precautions discussed -Patient advised to follow-up with OB as scheduled for prenatal care -Patient may return to MAU as needed or if her condition were to change or worsen   Rolm Bookbinder CNM 10/11/2021, 5:20 PM

## 2021-10-11 NOTE — MAU Note (Signed)
Pt reports to mau via ems after falling in the shower around 1300 today.  Pt states she fell and hit left side of abd and back. Denies vag bleeding.   ?

## 2021-10-12 LAB — GC/CHLAMYDIA PROBE AMP (~~LOC~~) NOT AT ARMC
Chlamydia: NEGATIVE
Comment: NEGATIVE
Comment: NORMAL
Neisseria Gonorrhea: NEGATIVE

## 2021-10-21 ENCOUNTER — Telehealth (INDEPENDENT_AMBULATORY_CARE_PROVIDER_SITE_OTHER): Payer: Medicaid Other

## 2021-10-21 ENCOUNTER — Telehealth (HOSPITAL_COMMUNITY): Payer: Self-pay | Admitting: *Deleted

## 2021-10-21 DIAGNOSIS — Z3480 Encounter for supervision of other normal pregnancy, unspecified trimester: Secondary | ICD-10-CM

## 2021-10-21 DIAGNOSIS — Z3A00 Weeks of gestation of pregnancy not specified: Secondary | ICD-10-CM

## 2021-10-21 MED ORDER — BLOOD PRESSURE MONITORING DEVI: 1.0000 | 0 refills | Status: DC

## 2021-10-21 NOTE — Telephone Encounter (Signed)
Called patient back after speaking with Link Snuffer PA re her pregnancy. She reports she is 9 weeks and 2 days pregnant. This is her second pregnancy. She almost never takes the hydroxyzine, is regularly taking the prozac and zyprexa and describes her mood as good and she is doing well right not. She can safely continue the prozac per Link Snuffer and he will consider the Zyprexa tonight and call her tomorrow with any changes re it. Since she is not taking the hydroxyzine told to continue not taking it. Eddie or myself will call her tomorrow re the zyprexa and she has a OBGYN appt on the 21st to discuss meds with him or her at that time as will.  ?

## 2021-10-21 NOTE — Progress Notes (Signed)
New OB Intake ? ?I connected with  [name] on 10/21/21 at  3:15 PM EDT by MyChart Video Visit and verified that I am speaking with the correct person using two identifiers. Nurse is located at [facility] and pt is located at [employer]. ? ?I discussed the limitations, risks, security and privacy concerns of performing an evaluation and management service by telephone and the availability of in person appointments. I also discussed with the patient that there may be a patient responsible charge related to this service. The patient expressed understanding and agreed to proceed. ? ?I explained I am completing New OB Intake today. We discussed her EDD of 06/02/22 that is based on U/S on 10/11/21. Pt is G3/P1. I reviewed her allergies, medications, Medical/Surgical/OB history, and appropriate screenings. I informed her of [facility] services. Based on history, this is a/an  pregnancy uncomplicated .  ? ?Patient Active Problem List  ? Diagnosis Date Noted  ? Supervision of other normal pregnancy, antepartum 10/21/2021  ? Bipolar I disorder, most recent episode mixed (HCC) 09/24/2021  ? GAD (generalized anxiety disorder) 09/24/2021  ? History of unprotected sex 05/08/2021  ? Marijuana abuse 05/05/2021  ? Bipolar affective disorder, current episode depressed (HCC) 05/04/2021  ? Bipolar 2 disorder, major depressive episode (HCC) 02/05/2021  ? Intentional drug overdose (HCC) 11/03/2020  ? Suicide attempt (HCC) 11/02/2020  ? Prolonged QT interval 11/02/2020  ? Anxiety 09/18/2019  ? LGSIL on Pap smear of cervix 09/18/2019  ? Oppositional defiant disorder 09/18/2019  ? Seasonal allergies 09/18/2019  ? Borderline personality disorder (HCC) 10/24/2016  ? PTSD (post-traumatic stress disorder) 10/24/2016  ? 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 ? ?Concerns addressed today ? ?Delivery Plans:  ?Plans to deliver at [facility].  ? ?MyChart/Babyscripts ?MyChart access verified. I explained pt will have some visits in office and some virtually.  Babyscripts instructions given and order placed. Patient verifies receipt of registration text/e-mail. Account successfully created and app downloaded. ? ?Blood Pressure Cuff  ?Blood pressure cuff ordered for patient to pick-up from [employer]. Explained after first prenatal appt pt will check weekly and document in Babyscripts. ? ?Weight scale: Patient does / does not  have weight scale. Weight scale ordered for patient to pick up from [employer].  ? ?Anatomy US ?Explained first scheduled [place] will be around 19 weeks. Anatomy US scheduled for 01/07/22 at 0130. Pt notified to arrive at 0115. ?Scheduled AFP lab only appointment if CenteringPregnancy pt for same day as anatomy US.  ? ?Labs ?Discussed Avelina Laine genetic screening with patient. Would like both Panorama and Horizon drawn at new OB visit.Also if interested in genetic testing, tell patient she will need AFP 15-21 weeks to complete genetic testing .Routine prenatal labs needed. ? ?Covid Vaccine ?Patient has not covid vaccine.  ? ?Is patient a CenteringPregnancy candidate? Not a candidate  "Centering Patient" indicated on sticky note ?  ?Is patient a Mom+Baby Combined Care candidate? Not a candidate   Scheduled with Mom+Baby provider  ?  ?Is patient interested in [employer]? No  "Interested in [employer] - Schedule next visit with CNM" on sticky note ? ?Informed patient of [facility] website  and placed link in her AVS.  ? ?Social Determinants of Health ?Food Insecurity: Patient denies food insecurity. ?WIC Referral: Patient is interested in referral to [facility].  ?Transportation: Patient denies transportation needs. ?Childcare: Discussed no children allowed at ultrasound appointments. Offered childcare services; patient declines childcare services at this time. ? ?Send link to Pregnancy Navigators ? ? ?  Placed OB Box on problem list and updated ? ?First visit review ?I reviewed new OB appt with pt. I explained she will have a pelvic exam, ob bloodwork with  genetic screening, and PAP smear. Explained pt will be seen by Dr. Adrian Blackwater at first visit; encounter routed to appropriate provider. Explained that patient will be seen by pregnancy navigator following visit with provider. [facility] information placed in AVS.  ? ?Henrietta Dine, CMA ?10/21/2021  3:24 PM  ?

## 2021-10-21 NOTE — Telephone Encounter (Signed)
VM from patient who has found out she is newly pregnant and is taking prescribed medicine per [place] PA. SHe is calling to check if she should continue with her meds in light of her knowing now she is pregnant. Will forward concern to Eddie to review her meds with her.  ?

## 2021-10-21 NOTE — Patient Instructions (Signed)
Safe Medications in Pregnancy   Acne:  Benzoyl Peroxide  Salicylic Acid   Backache/Headache:  Tylenol: 2 regular strength every 4 hours OR               2 Extra strength every 6 hours   Colds/Coughs/Allergies:  Benadryl (alcohol free) 25 mg every 6 hours as needed  Breath right strips  Claritin  Cepacol throat lozenges  Chloraseptic throat spray  Cold-Eeze- up to three times per day  Cough drops, alcohol free  Flonase (by prescription only)  Guaifenesin  Mucinex  Robitussin DM (plain only, alcohol free)  Saline nasal spray/drops  Sudafed (pseudoephedrine) & Actifed * use only after [redacted] weeks gestation and if you do not have high blood pressure  Tylenol  Vicks Vaporub  Zinc lozenges  Zyrtec   Constipation:  Colace  Ducolax suppositories  Fleet enema  Glycerin suppositories  Metamucil  Milk of magnesia  Miralax  Senokot  Smooth move tea   Diarrhea:  Kaopectate  Imodium A-D   *NO pepto Bismol   Hemorrhoids:  Anusol  Anusol HC  Preparation H  Tucks   Indigestion:  Tums  Maalox  Mylanta  Zantac  Pepcid   Insomnia:  Benadryl (alcohol free) 25mg every 6 hours as needed  Tylenol PM  Unisom, no Gelcaps   Leg Cramps:  Tums  MagGel   Nausea/Vomiting:  Bonine  Dramamine  Emetrol  Ginger extract  Sea bands  Meclizine  Nausea medication to take during pregnancy:  Unisom (doxylamine succinate 25 mg tablets) Take one tablet daily at bedtime. If symptoms are not adequately controlled, the dose can be increased to a maximum recommended dose of two tablets daily (1/2 tablet in the morning, 1/2 tablet mid-afternoon and one at bedtime).  Vitamin B6 100mg tablets. Take one tablet twice a day (up to 200 mg per day).   Skin Rashes:  Aveeno products  Benadryl cream or 25mg every 6 hours as needed  Calamine Lotion  1% cortisone cream   Yeast infection:  Gyne-lotrimin 7  Monistat 7    **If taking multiple medications, please check labels to avoid  duplicating the same active ingredients  **take medication as directed on the label  ** Do not exceed 4000 mg of tylenol in 24 hours  **Do not take medications that contain aspirin or ibuprofen          Childbirth Education Options: [facility] Classes:  Childbirth education classes can help you get ready for a positive parenting experience. You can also meet other expectant parents and get free stuff for your baby. Each class runs for five weeks on the same night and costs $45 for the mother-to-be and her support person. Medicaid covers the cost if you are eligible. Call [phone] to register. [facility] Childbirth Education: Classes can vary in availability and schedule is subject to change. For most up-to-date information please visit [email or web address] to review and register.        

## 2021-10-26 NOTE — Telephone Encounter (Signed)
Message acknowledged and reviewed. Provider to discuss medication options with patient during her follow up appointment.

## 2021-10-26 NOTE — Telephone Encounter (Signed)
Message was acknowledged and reviewed.

## 2021-11-04 ENCOUNTER — Other Ambulatory Visit (HOSPITAL_COMMUNITY)
Admission: RE | Admit: 2021-11-04 | Discharge: 2021-11-04 | Disposition: A | Discharge status: Home / Self Care | Payer: Medicaid Other | Source: Ambulatory Visit | Attending: Family Medicine | Admitting: Family Medicine

## 2021-11-04 ENCOUNTER — Ambulatory Visit (INDEPENDENT_AMBULATORY_CARE_PROVIDER_SITE_OTHER): Payer: Medicaid Other | Admitting: Family Medicine

## 2021-11-04 VITALS — BP 110/52 | HR 64 | Wt 215.0 lb

## 2021-11-04 DIAGNOSIS — F3160 Bipolar disorder, current episode mixed, unspecified: Secondary | ICD-10-CM

## 2021-11-04 DIAGNOSIS — Z3480 Encounter for supervision of other normal pregnancy, unspecified trimester: Secondary | ICD-10-CM | POA: Diagnosis not present

## 2021-11-04 DIAGNOSIS — F419 Anxiety disorder, unspecified: Secondary | ICD-10-CM | POA: Diagnosis not present

## 2021-11-04 NOTE — Progress Notes (Signed)
?Subjective:  ?[address] Sharon Ward is a G3P1011 110w0d by LMP c/w 1st trimester [place], being seen today for her first obstetrical visit.  Her obstetrical history is significant for  prior vaginal delivery. She has a history of Bipolar and SI . She has been controlled on Prozac and zyprexa. She recently stopped Zyprexa because she wasn't sure if it was safe in pregnancy. She has an appt with her psychiatrist tomorrow. Patient does intend to breast feed. Pregnancy history fully reviewed. ? ?Patient reports nausea. ? ?BP (!) 110/52   Pulse 64   Wt 215 lb (97.5 kg)   LMP 08/16/2021 (Exact Date)   BMI 38.09 kg/m?  ? ?HISTORY: ?OB History  ?Gravida Para Term Preterm AB Living  ?3 1 1   1 1   ?SAB IAB Ectopic Multiple Live Births  ?1     0 1  ?  ?# Outcome Date GA Lbr Len/2nd Weight Sex Delivery Anes PTL Lv  ?3 Current           ?2 SAB 07/02/20          ?1 Term 01/14/20 [redacted]w[redacted]d 01:21 / 00:59 7 lb 6.2 oz (3.351 kg) F Vag-Spont None  LIV  ? ? ?Past Medical History:  ?Diagnosis Date  ? Asthma   ? Bipolar 1 disorder, mixed (HCC)   ? Depression   ? Generalized anxiety disorder   ? Relationship dysfunction   ? ? ?Past Surgical History:  ?Procedure Laterality Date  ? PILONIDAL CYST / SINUS EXCISION  09/11/2013  ? PILONIDAL CYST EXCISION  05/17/2014  ? Pilonidal cystectomy with cleft lip  ? ? ?Family History  ?Problem Relation Age of Onset  ? Healthy Mother   ? Healthy Father   ? ? ? ?Exam  ?BP (!) 110/52   Pulse 64   Wt 215 lb (97.5 kg)   LMP 08/16/2021 (Exact Date)   BMI 38.09 kg/m?  ? ?Chaperone present during exam ? ?CONSTITUTIONAL: Well-developed, well-nourished female in no acute distress.  ?HENT:  Normocephalic, atraumatic, External right and left ear normal. Oropharynx is clear and moist ?EYES: Conjunctivae and EOM are normal. Pupils are equal, round, and reactive to light. No scleral icterus.  ?NECK: Normal range of motion, supple, no masses.  Normal thyroid.  ?CARDIOVASCULAR: Normal heart rate noted, regular  rhythm ?RESPIRATORY: Clear to auscultation bilaterally. Effort and breath sounds normal, no problems with respiration noted. ?BREASTS: deferred ?ABDOMEN: Soft, normal bowel sounds, no distention noted.  No tenderness, rebound or guarding.  ?PELVIC: deferred ?MUSCULOSKELETAL: Normal range of motion. No tenderness.  No cyanosis, clubbing, or edema.  2+ distal pulses. ?SKIN: Skin is warm and dry. No rash noted. Not diaphoretic. No erythema. No pallor. ?NEUROLOGIC: Alert and oriented to person, place, and time. Normal reflexes, muscle tone coordination. No cranial nerve deficit noted. ?PSYCHIATRIC: Normal mood and affect. Normal behavior. Normal judgment and thought content. ? ?  ?Assessment:  ? ? Pregnancy: G3P1011 ?Patient Active Problem List  ? Diagnosis Date Noted  ? Supervision of other normal pregnancy, antepartum 10/21/2021  ? Bipolar I disorder, most recent episode mixed (HCC) 09/24/2021  ? GAD (generalized anxiety disorder) 09/24/2021  ? History of unprotected sex 05/08/2021  ? Marijuana abuse 05/05/2021  ? Bipolar affective disorder, current episode depressed (HCC) 05/04/2021  ? Bipolar 2 disorder, major depressive episode (HCC) 02/05/2021  ? Intentional drug overdose (HCC) 11/03/2020  ? Suicide attempt (HCC) 11/02/2020  ? Prolonged QT interval 11/02/2020  ? Anxiety 09/18/2019  ? LGSIL on Pap smear of cervix  09/18/2019  ? Oppositional defiant disorder 09/18/2019  ? Seasonal allergies 09/18/2019  ? Borderline personality disorder (HCC) 10/24/2016  ? PTSD (post-traumatic stress disorder) 10/24/2016  ? 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 ? ?  ?Plan:  ? ?1. Supervision of other normal pregnancy, antepartum ?- GC/Chlamydia probe amp ([place])not at [facility] ?- Genetic Screening ?- Culture, OB Urine ?- Hemoglobin A1c ?- CBC/D/Plt+RPR+Rh+ABO+RubIgG... ? ?2. Bipolar I disorder, most recent episode mixed (HCC) ?Has appt with psych tomorrow. Currently controlled ? ?3. Anxiety ? ? ?  ?Initial labs obtained ?Continue prenatal  vitamins ?Reviewed n/v relief measures and warning s/s to report ?Reviewed recommended weight gain based on pre-gravid BMI ?Encouraged well-balanced diet ?Genetic & carrier screening discussed: requests Panorama,  ?Ultrasound discussed; fetal survey: requested ?CCNC completed> form faxed if has or is planning to apply for medicaid ?The nature of [facility] with multiple MDs and other Advanced Practice Providers was explained to patient; also emphasized that fellows, residents, and students are part of our team. ? ?Problem list reviewed and updated. ?75% of 30 min visit spent on counseling and coordination of care.  ?  ? ?Sharon Ward ?11/04/2021 ?

## 2021-11-05 ENCOUNTER — Encounter: Payer: Self-pay | Admitting: Family Medicine

## 2021-11-05 ENCOUNTER — Telehealth (HOSPITAL_COMMUNITY): Payer: Medicaid Other | Admitting: Physician Assistant

## 2021-11-05 ENCOUNTER — Encounter (HOSPITAL_COMMUNITY): Payer: Self-pay

## 2021-11-05 LAB — CBC/D/PLT+RPR+RH+ABO+RUBIGG...
Antibody Screen: NEGATIVE
Basophils Absolute: 0 10*3/uL (ref 0.0–0.2)
Basos: 0 %
EOS (ABSOLUTE): 0 10*3/uL (ref 0.0–0.4)
Eos: 0 %
HCV Ab: NONREACTIVE
HIV Screen 4th Generation wRfx: NONREACTIVE
Hematocrit: 39.2 % (ref 34.0–46.6)
Hemoglobin: 13.6 g/dL (ref 11.1–15.9)
Hepatitis B Surface Ag: NEGATIVE
Immature Grans (Abs): 0 10*3/uL (ref 0.0–0.1)
Immature Granulocytes: 0 %
Lymphocytes Absolute: 1.6 10*3/uL (ref 0.7–3.1)
Lymphs: 23 %
MCH: 32.1 pg (ref 26.6–33.0)
MCHC: 34.7 g/dL (ref 31.5–35.7)
MCV: 93 fL (ref 79–97)
Monocytes Absolute: 0.4 10*3/uL (ref 0.1–0.9)
Monocytes: 6 %
Neutrophils Absolute: 5.1 10*3/uL (ref 1.4–7.0)
Neutrophils: 71 %
Platelets: 241 10*3/uL (ref 150–450)
RBC: 4.24 x10E6/uL (ref 3.77–5.28)
RDW: 13.3 % (ref 11.7–15.4)
RPR Ser Ql: NONREACTIVE
Rh Factor: POSITIVE
Rubella Antibodies, IGG: 1.35 index (ref >0.99)
WBC: 7.2 10*3/uL (ref 3.4–10.8)

## 2021-11-05 LAB — GC/CHLAMYDIA PROBE AMP (~~LOC~~) NOT AT ARMC
Chlamydia: NEGATIVE
Comment: NEGATIVE
Comment: NORMAL
Neisseria Gonorrhea: NEGATIVE

## 2021-11-05 LAB — HEMOGLOBIN A1C
Est. average glucose Bld gHb Est-mCnc: 91 mg/dL
Hgb A1c MFr Bld: 4.8 % (ref 4.8–5.6)

## 2021-11-05 LAB — HCV INTERPRETATION

## 2021-11-08 ENCOUNTER — Encounter: Payer: Self-pay | Admitting: Family Medicine

## 2021-11-08 LAB — URINE CULTURE, OB REFLEX

## 2021-11-08 LAB — CULTURE, OB URINE

## 2021-11-09 ENCOUNTER — Encounter: Payer: Self-pay | Admitting: Family Medicine

## 2021-11-09 ENCOUNTER — Other Ambulatory Visit: Payer: Self-pay

## 2021-11-09 ENCOUNTER — Inpatient Hospital Stay (HOSPITAL_COMMUNITY)
Admission: RE | Admit: 2021-11-09 | Discharge: 2021-11-13 | DRG: 832 | Disposition: A | Discharge status: Home / Self Care | Payer: Medicaid Other | Attending: Psychiatry | Admitting: Psychiatry

## 2021-11-09 ENCOUNTER — Encounter (HOSPITAL_COMMUNITY): Payer: Self-pay | Admitting: Emergency Medicine

## 2021-11-09 DIAGNOSIS — R8271 Bacteriuria: Secondary | ICD-10-CM | POA: Insufficient documentation

## 2021-11-09 DIAGNOSIS — R45851 Suicidal ideations: Secondary | ICD-10-CM | POA: Diagnosis present

## 2021-11-09 DIAGNOSIS — O99331 Smoking (tobacco) complicating pregnancy, first trimester: Secondary | ICD-10-CM | POA: Diagnosis present

## 2021-11-09 DIAGNOSIS — F411 Generalized anxiety disorder: Secondary | ICD-10-CM | POA: Diagnosis present

## 2021-11-09 DIAGNOSIS — Z9151 Personal history of suicidal behavior: Secondary | ICD-10-CM

## 2021-11-09 DIAGNOSIS — J45909 Unspecified asthma, uncomplicated: Secondary | ICD-10-CM | POA: Diagnosis present

## 2021-11-09 DIAGNOSIS — F319 Bipolar disorder, unspecified: Secondary | ICD-10-CM | POA: Diagnosis not present

## 2021-11-09 DIAGNOSIS — F17210 Nicotine dependence, cigarettes, uncomplicated: Secondary | ICD-10-CM | POA: Diagnosis present

## 2021-11-09 DIAGNOSIS — F603 Borderline personality disorder: Secondary | ICD-10-CM | POA: Diagnosis present

## 2021-11-09 DIAGNOSIS — G4700 Insomnia, unspecified: Secondary | ICD-10-CM | POA: Diagnosis present

## 2021-11-09 DIAGNOSIS — O99351 Diseases of the nervous system complicating pregnancy, first trimester: Secondary | ICD-10-CM | POA: Diagnosis present

## 2021-11-09 DIAGNOSIS — Z3A10 10 weeks gestation of pregnancy: Secondary | ICD-10-CM

## 2021-11-09 DIAGNOSIS — F4312 Post-traumatic stress disorder, chronic: Secondary | ICD-10-CM | POA: Diagnosis present

## 2021-11-09 DIAGNOSIS — F3160 Bipolar disorder, current episode mixed, unspecified: Secondary | ICD-10-CM | POA: Diagnosis present

## 2021-11-09 DIAGNOSIS — O99341 Other mental disorders complicating pregnancy, first trimester: Secondary | ICD-10-CM | POA: Diagnosis present

## 2021-11-09 DIAGNOSIS — Z20822 Contact with and (suspected) exposure to covid-19: Secondary | ICD-10-CM | POA: Diagnosis present

## 2021-11-09 DIAGNOSIS — F419 Anxiety disorder, unspecified: Secondary | ICD-10-CM | POA: Diagnosis present

## 2021-11-09 DIAGNOSIS — F913 Oppositional defiant disorder: Secondary | ICD-10-CM | POA: Diagnosis present

## 2021-11-09 DIAGNOSIS — F4310 Post-traumatic stress disorder, unspecified: Secondary | ICD-10-CM | POA: Diagnosis present

## 2021-11-09 DIAGNOSIS — O99511 Diseases of the respiratory system complicating pregnancy, first trimester: Secondary | ICD-10-CM | POA: Diagnosis present

## 2021-11-09 LAB — RESP PANEL BY RT-PCR (FLU A&B, COVID) ARPGX2
Influenza A by PCR: NEGATIVE
Influenza B by PCR: NEGATIVE
SARS Coronavirus 2 by RT PCR: NEGATIVE

## 2021-11-09 LAB — GLUCOSE, CAPILLARY: Glucose-Capillary: 102 mg/dL — ABNORMAL HIGH (ref 70–99)

## 2021-11-09 MED ORDER — PRENATAL 28-0.8 MG PO TABS
1.0000 | ORAL_TABLET | Freq: Every day | ORAL | Status: DC
  Administered 2021-11-10: 1 via ORAL

## 2021-11-09 NOTE — Tx Team (Signed)
Initial Treatment Plan ?11/09/2021 ?4:24 PM ?Sharon Ward ?EVO:[id number] ? ? ? ?PATIENT STRESSORS: ?Marital or family conflict   ?Medication change or noncompliance   ? ? ?PATIENT STRENGTHS: ?Ability for insight  ?Supportive family/friends  ? ? ?PATIENT IDENTIFIED PROBLEMS: ?Hopelessness  ?Loneliness  ?depression  ?Anxiety  ?Anger ?  ?  ?  ?  ?  ?  ? ?DISCHARGE CRITERIA:  ?Ability to meet basic life and health needs ?Motivation to continue treatment in a less acute level of care ?Reduction of life-threatening or endangering symptoms to within safe limits ?Safe-care adequate arrangements made ? ?PRELIMINARY DISCHARGE PLAN: ?Return to previous living arrangement ? ?PATIENT/FAMILY INVOLVEMENT: ?This treatment plan has been presented to and reviewed with the patient, Sharon Ward,   The patient have been given the opportunity to ask questions and make suggestions. ? ?[name], RN ?11/09/2021, 4:24 PM ?
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
## 2021-11-09 NOTE — Plan of Care (Signed)
?  Problem: Safety: ?Goal: Ability to disclose and discuss suicidal ideas will improve ?Outcome: Progressing ?  ?Problem: Role Relationship: ?Goal: Will demonstrate positive changes in social behaviors and relationships ?Outcome: Progressing ?  ?
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
## 2021-11-10 LAB — RAPID URINE DRUG SCREEN, HOSP PERFORMED
Amphetamines: NOT DETECTED
Barbiturates: NOT DETECTED
Benzodiazepines: NOT DETECTED
Cocaine: NOT DETECTED
Opiates: NOT DETECTED
Tetrahydrocannabinol: POSITIVE — AB

## 2021-11-10 LAB — URINALYSIS, COMPLETE (UACMP) WITH MICROSCOPIC
Bilirubin Urine: NEGATIVE
Glucose, UA: NEGATIVE mg/dL
Hgb urine dipstick: NEGATIVE
Ketones, ur: NEGATIVE mg/dL
Leukocytes,Ua: NEGATIVE
Nitrite: NEGATIVE
Protein, ur: NEGATIVE mg/dL
Specific Gravity, Urine: 1.019 (ref 1.005–1.030)
pH: 7 (ref 5.0–8.0)

## 2021-11-10 LAB — COMPREHENSIVE METABOLIC PANEL
ALT: 14 U/L (ref 0–44)
AST: 15 U/L (ref 15–41)
Albumin: 3.8 g/dL (ref 3.5–5.0)
Alkaline Phosphatase: 50 U/L (ref 38–126)
Anion gap: 9 (ref 5–15)
BUN: 7 mg/dL (ref 6–20)
CO2: 25 mmol/L (ref 22–32)
Calcium: 9.2 mg/dL (ref 8.9–10.3)
Chloride: 103 mmol/L (ref 98–111)
Creatinine, Ser: 0.52 mg/dL (ref 0.44–1.00)
GFR, Estimated: >60 mL/min (ref >60)
Glucose, Bld: 95 mg/dL (ref 70–99)
Potassium: 3.6 mmol/L (ref 3.5–5.1)
Sodium: 137 mmol/L (ref 135–145)
Total Bilirubin: 0.3 mg/dL (ref 0.3–1.2)
Total Protein: 7 g/dL (ref 6.5–8.1)

## 2021-11-10 LAB — CBC
HCT: 38.8 % (ref 36.0–46.0)
Hemoglobin: 13.2 g/dL (ref 12.0–15.0)
MCH: 32 pg (ref 26.0–34.0)
MCHC: 34 g/dL (ref 30.0–36.0)
MCV: 94.2 fL (ref 80.0–100.0)
Platelets: 213 10*3/uL (ref 150–400)
RBC: 4.12 MIL/uL (ref 3.87–5.11)
RDW: 13.3 % (ref 11.5–15.5)
WBC: 7.2 10*3/uL (ref 4.0–10.5)
nRBC: 0 % (ref 0.0–0.2)

## 2021-11-10 LAB — LIPID PANEL
Cholesterol: 188 mg/dL (ref 0–200)
HDL: 82 mg/dL (ref >40)
LDL Cholesterol: 93 mg/dL (ref 0–99)
Total CHOL/HDL Ratio: 2.3 RATIO
Triglycerides: 65 mg/dL (ref <150)
VLDL: 13 mg/dL (ref 0–40)

## 2021-11-10 LAB — HEPATIC FUNCTION PANEL
ALT: 13 U/L (ref 0–44)
AST: 16 U/L (ref 15–41)
Albumin: 3.7 g/dL (ref 3.5–5.0)
Alkaline Phosphatase: 49 U/L (ref 38–126)
Bilirubin, Direct: 0.1 mg/dL (ref 0.0–0.2)
Indirect Bilirubin: 0.3 mg/dL (ref 0.3–0.9)
Total Bilirubin: 0.4 mg/dL (ref 0.3–1.2)
Total Protein: 6.7 g/dL (ref 6.5–8.1)

## 2021-11-10 LAB — MAGNESIUM: Magnesium: 2.2 mg/dL (ref 1.7–2.4)

## 2021-11-10 LAB — ETHANOL: Alcohol, Ethyl (B): <10 mg/dL (ref <10)

## 2021-11-10 LAB — TSH: TSH: 2.921 u[IU]/mL (ref 0.350–4.500)

## 2021-11-10 MED ORDER — FLUOXETINE HCL 10 MG PO CAPS
10.0000 mg | ORAL_CAPSULE | Freq: Every day | ORAL | Status: DC
  Administered 2021-11-10 – 2021-11-12 (×3): 10 mg via ORAL
  Filled 2021-11-10 (×6): qty 1
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
## 2021-11-10 NOTE — Progress Notes (Signed)
Pt denies SI/HI/AVH and verbally agrees to approach staff if these become apparent or before harming themselves/others. Rates depression 2/10. Rates anxiety 0/10. Rates pain 0/10. Pt has been interacting with other pts for the majority of the day. Pt has been speaking softly and timidly. Pt has been flat at times. Scheduled medications administered to pt, per MD orders. RN provided support and encouragement to pt. Q15 min safety checks implemented and continued. Pt safe on the unit. RN will continue to monitor and intervene as needed.  ? 11/10/21 0810  ?Psych Admission Type (Psych Patients Only)  ?Admission Status Voluntary  ?Psychosocial Assessment  ?Patient Complaints Depression  ?Eye Contact Fair  ?Facial Expression Animated  ?Affect Sad  ?Speech Soft;Logical/coherent  ?Interaction Needy;Assertive  ?Motor Activity Restless  ?Appearance/Hygiene Unremarkable  ?Behavior Characteristics Cooperative;Appropriate to situation;Calm  ?Mood Sad  ?Thought Process  ?Coherency Circumstantial  ?Content Blaming others;Blaming self  ?Delusions None reported or observed  ?Perception WDL  ?Hallucination None reported or observed  ?Judgment Limited  ?Confusion None  ?Danger to Self  ?Current suicidal ideation? Denies  ?Danger to Others  ?Danger to Others None reported or observed  ? ? ?

## 2021-11-10 NOTE — BHH Suicide Risk Assessment (Signed)
[facility] Admission Suicide Risk Assessment ? ? ?Nursing information obtained from:  Patient ?Demographic factors:  Living alone ?Current Mental Status:  Suicidal ideation indicated by patient ?Loss Factors:  Loss of significant relationship ?Historical Factors:  Family history of mental illness or substance abuse ?Risk Reduction Factors:  Pregnancy ? ?Total Time spent with patient: 45 minutes ?Principal Problem: Bipolar disorder, rapid cycling (HCC) ?Diagnosis:  Principal Problem: ?  Bipolar disorder, rapid cycling (HCC) ?Active Problems: ?  Anxiety ?  Oppositional defiant disorder ?  Borderline personality disorder (HCC) ?  PTSD (post-traumatic stress disorder) ?  GAD (generalized anxiety disorder) ? ?Subjective Data: Sharon Ward is a 26 YO F with a history of bipolar, anxiety, ODD, borderline personality disorder, PTSD and 10 week pregnancy, recently taken off some of her medications and who has been more labile of mood the last 3-4 weeks. She has been having argument with her boyfriend and they broke up 2 days ago. She was having thoughts of suicide after this and took a bus to the hospital. She came impatient for further observation and medication adjustment because she feels that she is too unstable. She admits "I was a little suicidal but I'm not anymore". She denies having hallucinations, delusions or paranoia. She has been compliant with the fluoxetine, but feels she needs additional medications to stabilize her mood. She she been taking her vitamins as well. She is compliant with follow up with her weekly [facility] with psychiatrist and therapist. She has been sleeping and eating regularly.  ? ?Continued Clinical Symptoms:  ?Alcohol Use Disorder Identification Test Final Score (AUDIT): 0 ?The "Alcohol Use Disorders Identification Test", Guidelines for Use in Primary Care, Second Edition.  World Science writer [facility]). ?Score between 0-7:  no or low risk or alcohol related problems. ?Score between 8-15:  moderate risk  of alcohol related problems. ?Score between 16-19:  high risk of alcohol related problems. ?Score 20 or above:  warrants further diagnostic evaluation for alcohol dependence and treatment. ? ? ?CLINICAL FACTORS:  ? Severe Anxiety and/or Agitation ?Bipolar Disorder:   Depressive phase ?Personality Disorders:   Cluster B ? ? ?Musculoskeletal: ?Strength & Muscle Tone: within normal limits ?Gait & Station: normal ?Patient leans: N/A ? ?Psychiatric Specialty Exam: ? ?Presentation  ?General Appearance: Appropriate for Environment ? ?Eye Contact:Fair ? ?Speech:Normal Rate ? ?Speech Volume:Normal ? ?Handedness:Right ? ? ?Mood and Affect  ?Mood:Anxious; Depressed ? ?Affect:Blunt ? ? ?Thought Process  ?Thought Processes:Linear ? ?Descriptions of Associations:Intact ? ?Orientation:Full (Time, Place and Person) ? ?Thought Content:Logical ? ?History of Schizophrenia/Schizoaffective disorder:No ? ?Duration of Psychotic Symptoms:No data recorded ?Hallucinations:Hallucinations: None ? ?Ideas of Reference:None ? ?Suicidal Thoughts:Suicidal Thoughts: No ?SI Passive Intent and/or Plan: With Intent ? ?Homicidal Thoughts:Homicidal Thoughts: No ? ? ?Sensorium  ?Memory:Immediate Fair; Remote Fair ? ?Judgment:Poor ? ?Insight:Shallow ? ? ?Executive Functions  ?Concentration:Fair ? ?Attention Span:Fair ? ?Recall:Fair ? ?Fund of Knowledge:Fair ? ?Language:Fair ? ? ?Psychomotor Activity  ?Psychomotor Activity:Psychomotor Activity: Normal ? ? ?Assets  ?Assets:Communication Skills; Physical Health ? ? ?Sleep  ?Sleep:Sleep: Fair ?Number of Hours of Sleep: 2 ? ? ? ?Physical Exam: ?Physical Exam ?Constitutional:   ?   Appearance: Normal appearance.  ?HENT:  ?   Head: Normocephalic.  ?Eyes:  ?   Extraocular Movements: Extraocular movements intact.  ?Cardiovascular:  ?   Rate and Rhythm: Normal rate.  ?Pulmonary:  ?   Effort: Pulmonary effort is normal.  ?Musculoskeletal:  ?   Cervical back: Normal range of motion.  ?Neurological:  ?   General:  No  focal deficit present.  ?   Mental Status: She is alert and oriented to person, place, and time.  ? ?Review of Systems  ?Constitutional:  Negative for chills and fever.  ?HENT:  Negative for hearing loss.   ?Eyes:  Negative for blurred vision.  ?Respiratory:  Negative for cough.   ?Cardiovascular:  Negative for chest pain and palpitations.  ?Gastrointestinal:  Negative for constipation and diarrhea.  ?Musculoskeletal:  Negative for back pain and myalgias.  ?Neurological:  Negative for dizziness and headaches.  ?Psychiatric/Behavioral:  Positive for depression. Negative for hallucinations, substance abuse and suicidal ideas.   ?Blood pressure (!) 101/57, pulse 88, temperature 98.3 ?F (36.8 ?C), temperature source Oral, resp. rate 16, height 5\' 3"  (1.6 m), weight 95.9 kg, last menstrual period 08/16/2021, SpO2 100 %, not currently breastfeeding. Body mass index is 37.45 kg/m?. ? ? ?COGNITIVE FEATURES THAT CONTRIBUTE TO RISK:  ?Polarized thinking   ? ?SUICIDE RISK:  ? Moderate:  Frequent suicidal ideation with limited intensity, and duration, some specificity in terms of plans, no associated intent, good self-control, limited dysphoria/symptomatology, some risk factors present, and identifiable protective factors, including available and accessible social support. ? ?PLAN OF CARE:  ?Safety and Monitoring ?--  Admission to inpatient psychiatric unit for safety, stabilization and treatment ?-- Daily contact with patient to assess and evaluate symptoms and progress in treatment ?-- Patient's case to be discussed in multi-disciplinary team meeting. ?-- Patient will be encouraged to participate in the therapeutic group milieu. ?-- Observation Level : q15 minute checks ?-- Vital signs:  q12 hours ?-- Precautions: suicide. ? Plan  ?-Monitor Vitals. ?-Monitor for thoughts of harm to self or others ?-Monitor for psychosis, disorganization or changes to cognition ?-Monitor for withdrawal symptoms. ?-Monitor for medication side  effects. ? ?Labs/Studies: ?Lipids, A1c ? ?Medications: ?Continue fluoxetine ?Will research safety in pregnancy with team  ? ?I certify that inpatient services furnished can reasonably be expected to improve the patient's condition.  ? ?10/14/2021, MD ?11/10/2021, 5:45 PM ? ?

## 2021-11-10 NOTE — Progress Notes (Signed)
?   11/09/21 2115  ?Psych Admission Type (Psych Patients Only)  ?Admission Status Voluntary  ?Psychosocial Assessment  ?Patient Complaints Anxiety;Crying spells;Depression  ?Eye Contact Brief  ?Facial Expression Sad  ?Affect Depressed  ?Speech Soft  ?Interaction Needy  ?Motor Activity Fidgety  ?Appearance/Hygiene Unremarkable  ?Behavior Characteristics Cooperative  ?Mood Depressed;Sad  ?Thought Process  ?Coherency Circumstantial  ?Content Blaming others;Blaming self  ?Delusions None reported or observed  ?Perception WDL  ?Hallucination None reported or observed  ?Judgment Poor  ?Confusion None  ?Danger to Self  ?Current suicidal ideation? Passive  ?Self-Injurious Behavior No self-injurious ideation or behavior indicators observed or expressed   ?Agreement Not to Harm Self Yes  ?Description of Agreement Verbal Contract  ?Danger to Others  ?Danger to Others None reported or observed  ? ? ?

## 2021-11-10 NOTE — Group Note (Signed)
Recreation Therapy Group Note ? ? ?Group Topic:Problem Solving  ?Group Date: 11/10/2021 ?Start Time: 1054 ?End Time: 1122 ?Facilitators: Victorino Sparrow, LRT,CTRS ?Location:  [place] [facility] [place] ? ? ?Goal Area(s) Addresses:  ?Patient will effectively work with peer towards shared goal.  ?Patient will identify skills used to make activity successful.  ?Patient will identify how skills used during activity can be applied to reach post d/c goals.  ? ?Group Description: Tallest [employer]. In teams of 2, patients were given 25 small craft pipe cleaners. Using the materials provided, patients were instructed to compete again the opposing team(s) to build the tallest free-standing structure from floor level. The activity was timed; difficulty increased by Probation officer as Pharmacist, hospital continued.  Systematically resources were removed with additional directions for example, placing one arm behind their back, working in silence, and shape stipulations. LRT facilitated post-activity discussion reviewing team processes and necessary skills involved in completion. Patients were encouraged to reflect how the skills utilized, or not utilized, in this activity can be incorporated to positively impact support systems post discharge. ? ? ?Affect/Mood: Appropriate ?  ?Participation Level: Engaged ?  ?Participation Quality: Independent ?  ?Behavior: Appropriate ?  ?Speech/Thought Process: Focused ?  ?Insight: Good ?  ?Judgement: Good ?  ?Modes of Intervention: STEM Activity ?  ?Patient Response to Interventions:  Engaged ?  ?Education Outcome: ? Acknowledges education and In group clarification offered   ? ?Clinical Observations/Individualized Feedback: Pt was bright and intrigued by the activity as well.  Pt worked well with peer in coming up with an idea for their structure.  Pt appeared to let partner lead more during activity.  Pt did need some reminders to stick to what the activity called for when elements  were removed from use.  Overall, pt was engaged and seemed to enjoy the activity.  ? ? ?Plan: Continue to engage patient in RT group sessions 2-3x/week. ? ? ?Victorino Sparrow, LRT,CTRS ?11/10/2021 12:49 PM ?
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
## 2021-11-11 ENCOUNTER — Encounter (HOSPITAL_COMMUNITY): Payer: Self-pay

## 2021-11-11 LAB — PROLACTIN: Prolactin: 48.1 ng/mL — ABNORMAL HIGH (ref 4.8–23.3)

## 2021-11-11 LAB — HEMOGLOBIN A1C
Hgb A1c MFr Bld: 4.8 % (ref 4.8–5.6)
Mean Plasma Glucose: 91 mg/dL

## 2021-11-11 MED ORDER — PRENATAL 28-0.8 MG PO TABS
1.0000 | ORAL_TABLET | Freq: Every day | ORAL | Status: DC
  Administered 2021-11-11 – 2021-11-12 (×2): 1 via ORAL

## 2021-11-11 NOTE — Group Note (Signed)
Recreation Therapy Group Note ? ? ?Group Topic:Leisure Education  ?Group Date: 11/11/2021 ?Start Time: 0930 ?End Time: 1000 ?Facilitators: Tamikia Chowning, LRT,CTRS ?Location:  Rooms ? ? ?Goal Area(s) Addresses:  ?Patient will review and complete packet supporting identification of healthy leisure and recreation activities.  ? ?Group Description: LRT provided pt a workbook reviewing leisure and its impact on emotional states and personal fulfillment. Pt to complete the packet in their room at their own pace. ? ? ?Participation Quality: Independent ?  ? ?Clinical Observations/Individualized Feedback: Due to illness on the unit, patients were given packets dealing with leisure education and its impact on individual wellbeing. ? ? ?Plan: Continue to engage patient in RT group sessions 2-3x/week. ? ? ?Sharon Ward, LRT,CTRS ?11/11/2021 12:40 PM ?

## 2021-11-11 NOTE — Group Note (Signed)
LCSW Group Therapy Note ? ? ?Group Date: 11/11/2021 ?Start Time: 1300 ?End Time: 1400 ? ?Type of Therapy and Topic:  Group Therapy: Self-Esteem  ?  ?Participation Level:  Active ?  ?Description of Group:   Due to the acuity on the unit, staff recommended no close contact at this time so group was not held.  Patient was provided with written information and worksheets about Self-Esteem.  CSW was reviewed the packet with the Pt and answered all questions.  ? ?Jacqulin Brandenburger M Keiana Tavella, LCSWA ?11/11/2021  1:13 PM   ? ?

## 2021-11-11 NOTE — Progress Notes (Addendum)
Patient is alert and oriented. Patient is fixated on leaving because she states that she does not want to get sick and does not want her unborn child to get sick with a virus going around. Patient is also complaining of not being able to leave her room. Patient is crying and refusing to go into her room. Patient requested to see the provider again. Provider notified of patient's behavior. Patient informed that provider will see her tomorrow.  ? 11/11/21 1100  ?Psych Admission Type (Psych Patients Only)  ?Admission Status Voluntary  ?Psychosocial Assessment  ?Patient Complaints Anxiety;Worrying  ?Eye Contact Fair  ?Facial Expression Worried;Anxious  ?Affect Depressed  ?Speech Logical/coherent;Soft  ?Interaction Needy  ?Motor Activity Fidgety;Restless  ?Appearance/Hygiene Unremarkable  ?Behavior Characteristics Cooperative;Appropriate to situation  ?Mood Depressed;Sad  ?Thought Process  ?Coherency Circumstantial  ?Content Other (Comment) ?(Pt reports being afraid of her baby catching a virus while here.)  ?Delusions None reported or observed  ?Perception WDL  ?Hallucination None reported or observed  ?Judgment Limited  ?Confusion None  ?Danger to Self  ?Current suicidal ideation? Denies  ?Self-Injurious Behavior No self-injurious ideation or behavior indicators observed or expressed   ?Agreement Not to Harm Self Yes  ?Description of Agreement Verbal Contract  ?Danger to Others  ?Danger to Others None reported or observed  ? ? ?

## 2021-11-11 NOTE — BH IP Treatment Plan (Signed)
Interdisciplinary Treatment and Diagnostic Plan Update ? ?11/11/2021 ?Time of Session: 9:35am  ?[name] ?MRN: [id number] ? ?Principal Diagnosis: Bipolar disorder, rapid cycling ([place]) ? ?Secondary Diagnoses: Principal Problem: ?  Bipolar disorder, rapid cycling ([place]) ?Active Problems: ?  Anxiety ?  Oppositional defiant disorder ?  Borderline personality disorder ([place]) ?  PTSD (post-traumatic stress disorder) ?  GAD (generalized anxiety disorder) ? ? ?Current Medications:  ?Current Facility-Administered Medications  ?Medication Dose Route Frequency Provider Last Rate Last Admin  ? FLUoxetine (PROZAC) capsule 10 mg  10 mg Oral QHS Hill, Jackie Plum, MD   10 mg at 11/10/21 2117  ? Prenatal 28-0.8 MG TABS 1 tablet  1 tablet Oral QHS Hill, Jackie Plum, MD      ? ?PTA Medications: ?Medications Prior to Admission  ?Medication Sig Dispense Refill Last Dose  ? FLUoxetine (PROZAC) 10 MG capsule Take 1 capsule (10 mg total) by mouth daily. (Patient taking differently: Take 10 mg by mouth at bedtime.) 30 capsule 2 11/08/2021  ? Prenatal Vit-Fe Fumarate-FA (PREPLUS) 27-1 MG TABS Take 1 tablet by mouth at bedtime.   11/08/2021  ? Blood Pressure Monitoring DEVI 1 each by Does not apply route once a week. 1 each 0   ? ? ?Patient Stressors: Marital or family conflict   ?Medication change or noncompliance   ? ?Patient Strengths: Ability for insight  ?Supportive family/friends  ? ?Treatment Modalities: Medication Management, Group therapy, Case management,  ?1 to 1 session with clinician, Psychoeducation, Recreational therapy. ? ? ?Physician Treatment Plan for Primary Diagnosis: Bipolar disorder, rapid cycling ([place]) ?Long Term Goal(s): Improvement in symptoms so as ready for discharge  ? ?Short Term Goals: Ability to identify changes in lifestyle to reduce recurrence of condition will improve ?Ability to verbalize feelings will improve ?Ability to disclose and discuss suicidal ideas ?Ability to demonstrate self-control will  improve ?Ability to identify and develop effective coping behaviors will improve ?Ability to maintain clinical measurements within normal limits will improve ?Compliance with prescribed medications will improve ?Ability to identify triggers associated with substance abuse/mental health issues will improve ? ?Medication Management: Evaluate patient's response, side effects, and tolerance of medication regimen. ? ?Therapeutic Interventions: 1 to 1 sessions, Unit Group sessions and Medication administration. ? ?Evaluation of Outcomes: Not Met ? ?Physician Treatment Plan for Secondary Diagnosis: Principal Problem: ?  Bipolar disorder, rapid cycling ([place]) ?Active Problems: ?  Anxiety ?  Oppositional defiant disorder ?  Borderline personality disorder ([place]) ?  PTSD (post-traumatic stress disorder) ?  GAD (generalized anxiety disorder) ? ?Long Term Goal(s): Improvement in symptoms so as ready for discharge  ? ?Short Term Goals: Ability to identify changes in lifestyle to reduce recurrence of condition will improve ?Ability to verbalize feelings will improve ?Ability to disclose and discuss suicidal ideas ?Ability to demonstrate self-control will improve ?Ability to identify and develop effective coping behaviors will improve ?Ability to maintain clinical measurements within normal limits will improve ?Compliance with prescribed medications will improve ?Ability to identify triggers associated with substance abuse/mental health issues will improve    ? ?Medication Management: Evaluate patient's response, side effects, and tolerance of medication regimen. ? ?Therapeutic Interventions: 1 to 1 sessions, Unit Group sessions and Medication administration. ? ?Evaluation of Outcomes: Not Met ? ? ?RN Treatment Plan for Primary Diagnosis: Bipolar disorder, rapid cycling ([place]) ?Long Term Goal(s): Knowledge of disease and therapeutic regimen to maintain health will improve ? ?Short Term Goals: Ability to remain free from injury will  improve, Ability to participate in decision  making will improve, Ability to verbalize feelings will improve, Ability to disclose and discuss suicidal ideas, and Ability to identify and develop effective coping behaviors will improve ? ?Medication Management: RN will administer medications as ordered by provider, will assess and evaluate patient's response and provide education to patient for prescribed medication. RN will report any adverse and/or side effects to prescribing provider. ? ?Therapeutic Interventions: 1 on 1 counseling sessions, Psychoeducation, Medication administration, Evaluate responses to treatment, Monitor vital signs and CBGs as ordered, Perform/monitor CIWA, COWS, AIMS and Fall Risk screenings as ordered, Perform wound care treatments as ordered. ? ?Evaluation of Outcomes: Not Met ? ? ?LCSW Treatment Plan for Primary Diagnosis: Bipolar disorder, rapid cycling ([place]) ?Long Term Goal(s): Safe transition to appropriate next level of care at discharge, Engage patient in therapeutic group addressing interpersonal concerns. ? ?Short Term Goals: Engage patient in aftercare planning with referrals and resources, Increase social support, Increase emotional regulation, Facilitate acceptance of mental health diagnosis and concerns, Identify triggers associated with mental health/substance abuse issues, and Increase skills for wellness and recovery ? ?Therapeutic Interventions: Assess for all discharge needs, 1 to 1 time with Education officer, museum, Explore available resources and support systems, Assess for adequacy in community support network, Educate family and significant other(s) on suicide prevention, Complete Psychosocial Assessment, Interpersonal group therapy. ? ?Evaluation of Outcomes: Not Met ? ? ?Progress in Treatment: ?Attending groups: No. ?Participating in groups: No.Due to illness on unit  ?Taking medication as prescribed: Yes. ?Toleration medication: Yes. ?Family/Significant other contact made: Yes,  individual(s) contacted:  ACTT Therapist  ?Patient understands diagnosis: No. ?Discussing patient identified problems/goals with staff: Yes. ?Medical problems stabilized or resolved: Yes. ?Denies suicidal/homicidal ideation: Yes. ?Issues/concerns per patient self-inventory: No. ? ? ?New problem(s) identified: No, Describe:  None  ? ?New Short Term/Long Term Goal(s): medication stabilization, elimination of SI thoughts, development of comprehensive mental wellness plan. ? ?Patient Goals: "To not put my baby at risk"  ? ?Discharge Plan or Barriers: Patient recently admitted. CSW will continue to follow and assess for appropriate referrals and possible discharge planning. ? ?Reason for Continuation of Hospitalization: Depression ?Medication stabilization ?Suicidal ideation ? ?Estimated Length of Stay: 3 to 5 days  ? ? ?Scribe for Treatment Team: ?Darleen Crocker, Latanya Presser ?11/11/2021 ?1:55 PM ?

## 2021-11-11 NOTE — Plan of Care (Signed)
?  Problem: Activity: ?Goal: Imbalance in normal sleep/wake cycle will improve ?Outcome: Progressing ?  ?Problem: Coping: ?Goal: Coping ability will improve ?Outcome: Progressing ?Goal: Will verbalize feelings ?Outcome: Progressing ?  ?Problem: Health Behavior/Discharge Planning: ?Goal: Ability to make decisions will improve ?Outcome: Progressing ?  ?Problem: Role Relationship: ?Goal: Will demonstrate positive changes in social behaviors and relationships ?Outcome: Progressing ?  ?Problem: Safety: ?Goal: Ability to disclose and discuss suicidal ideas will improve ?Outcome: Progressing ?Goal: Ability to identify and utilize support systems that promote safety will improve ?Outcome: Progressing ?  ?Problem: Self-Concept: ?Goal: Will verbalize positive feelings about self ?Outcome: Progressing ?Goal: Level of anxiety will decrease ?Outcome: Progressing ?  ?

## 2021-11-11 NOTE — Progress Notes (Signed)
?   11/10/21 2130  ?Psych Admission Type (Psych Patients Only)  ?Admission Status Voluntary  ?Psychosocial Assessment  ?Patient Complaints Depression  ?Eye Contact Brief  ?Facial Expression Sad  ?Affect Depressed  ?Speech Soft  ?Interaction Needy  ?Motor Activity Fidgety  ?Appearance/Hygiene Unremarkable  ?Behavior Characteristics Cooperative  ?Mood Depressed  ?Thought Process  ?Coherency Circumstantial  ?Content Blaming others;Blaming self  ?Delusions None reported or observed  ?Perception WDL  ?Hallucination None reported or observed  ?Judgment Poor  ?Confusion None  ?Danger to Self  ?Current suicidal ideation? Denies  ?Self-Injurious Behavior No self-injurious ideation or behavior indicators observed or expressed   ?Agreement Not to Harm Self Yes  ?Description of Agreement Verbal Contract  ?Danger to Others  ?Danger to Others None reported or observed  ? ? ?
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
## 2021-11-12 NOTE — Progress Notes (Signed)
[facility] MD Progress Note ? ?11/12/2021 12:14 PM ?[name]  ?MRN:  [id number] ?Subjective:  Sharon Ward is a 26 YO F with a history of bipolar, anxiety, ODD, borderline personality disorder, PTSD and 10 week pregnancy, recently taken off some of her medications and who has been more labile of mood the last 3-4 weeks. She has been having argument with her boyfriend and they broke up 2 days ago. ? ?Patient reports that she slept well and is eating well. Patient denies having any morning sickness, abdominal pain, chest pain, or headache. Patient reports that she is not feeling depressed or anxious this AM. Patient reports that she tried to call her therapist yesterday, but found out that her therapist was on vacation. Patient reports that she has not reached out to the father of her child or any of her family. Patient reports that she believes that the argument with her child's father led to her feeling suicidal. Patient reports that she is not currently having SI, HI,nor AVH. Patient reports that she was not having SI prior to this episode during her pregnancy. Patient reports that reports that her thoughts do not feel rapid. Patient denies having thoughts or urges of wanting to harm her fetus. Patient reports that her mood this AM is "calm." Patient reports that she would prefer for her ACTT to adjust her meds and she feels confident with this plan because she see's someone almost daily from the team. Patient endorses feeling supported by the team.   ? ?Patient has been observed to be interacting well with others on the unit and able to occupy herself fairly well. ? ?Principal Problem: Bipolar disorder, rapid cycling (HCC) ?Diagnosis: Principal Problem: ?  Bipolar disorder, rapid cycling (HCC) ?Active Problems: ?  Anxiety ?  Oppositional defiant disorder ?  Borderline personality disorder (HCC) ?  PTSD (post-traumatic stress disorder) ?  GAD (generalized anxiety disorder) ? ?Total Time spent with patient: 20  minutes ? ?Past Psychiatric History: See H&P ? ?Past Medical History:  ?Past Medical History:  ?Diagnosis Date  ? Asthma   ? Bipolar 1 disorder, mixed (HCC)   ? Depression   ? Generalized anxiety disorder   ? Relationship dysfunction   ?  ?Past Surgical History:  ?Procedure Laterality Date  ? PILONIDAL CYST / SINUS EXCISION  09/11/2013  ? PILONIDAL CYST EXCISION  05/17/2014  ? Pilonidal cystectomy with cleft lip  ? ?Family History:  ?Family History  ?Problem Relation Age of Onset  ? Healthy Mother   ? Healthy Father   ? ?Family Psychiatric  History:  See H&P ?Social History:  ?Social History  ? ?Substance and Sexual Activity  ?Alcohol Use Not Currently  ? Comment: occasional prior to pregnancy  ?   ?Social History  ? ?Substance and Sexual Activity  ?Drug Use Yes  ? Frequency: 4.0 times per week  ? Types: Marijuana  ? Comment: reports use 4 or 5 times; last used 05/04/19  ?  ?Social History  ? ?Socioeconomic History  ? Marital status: Significant Other  ?  Spouse name: Not on file  ? Number of children: 1  ? Years of education: 10  ? Highest education level: 10th grade  ?Occupational History  ? Not on file  ?Tobacco Use  ? Smoking status: Every Day  ?  Packs/day: 1.00  ?  Years: 15.00  ?  Pack years: 15.00  ?  Types: Cigarettes  ? Smokeless tobacco: Never  ? Tobacco comments:  ?  only smoke a "couple"  of cigarettes when stressed or anxious, socially with friends per [facility] chart  ?Vaping Use  ? Vaping Use: Never used  ?Substance and Sexual Activity  ? Alcohol use: Not Currently  ?  Comment: occasional prior to pregnancy  ? Drug use: Yes  ?  Frequency: 4.0 times per week  ?  Types: Marijuana  ?  Comment: reports use 4 or 5 times; last used 05/04/19  ? Sexual activity: Yes  ?  Partners: Male  ?  Birth control/protection: None  ?Other Topics Concern  ? Not on file  ?Social History Narrative  ? Not on file  ? ?Social Determinants of Health  ? ?Financial Resource Strain: Not on file  ?Food Insecurity: No Food Insecurity  ?  Worried About Charity fundraiser in the Last Year: Never true  ? Ran Out of Food in the Last Year: Never true  ?Transportation Needs: No Transportation Needs  ? Lack of Transportation (Medical): No  ? Lack of Transportation (Non-Medical): No  ?Physical Activity: Not on file  ?Stress: Not on file  ?Social Connections: Not on file  ? ?Additional Social History:  ?  ?  ?  ?  ?  ?  ?  ?  ?  ?  ?  ? ?Sleep: Good ? ?Appetite:  Good ? ?Current Medications: ?Current Facility-Administered Medications  ?Medication Dose Route Frequency Provider Last Rate Last Admin  ? FLUoxetine (PROZAC) capsule 10 mg  10 mg Oral QHS Hill, Jackie Plum, MD   10 mg at 11/11/21 2104  ? Prenatal 28-0.8 MG TABS 1 tablet  1 tablet Oral QHS Hill, Jackie Plum, MD   1 tablet at 11/11/21 2104  ? ? ?Lab Results: No results found for this or any previous visit (from the past 48 hour(s)). ? ?Blood Alcohol level:  ?Lab Results  ?Component Value Date  ? ETH <10 11/10/2021  ? ETH <10 05/03/2021  ? ? ?Metabolic Disorder Labs: ?Lab Results  ?Component Value Date  ? HGBA1C 4.8 11/10/2021  ? MPG 91 11/10/2021  ? MPG 102.54 04/01/2021  ? ?Lab Results  ?Component Value Date  ? PROLACTIN 48.1 (H) 11/10/2021  ? ?Lab Results  ?Component Value Date  ? CHOL 188 11/10/2021  ? TRIG 65 11/10/2021  ? HDL 82 11/10/2021  ? CHOLHDL 2.3 11/10/2021  ? VLDL 13 11/10/2021  ? [place] 93 11/10/2021  ? LDLCALC 103 (H) 04/01/2021  ? ? ?Physical Findings: ?AIMS: Facial and Oral Movements ?Muscles of Facial Expression: None, normal ?Lips and Perioral Area: None, normal ?Jaw: None, normal ?Tongue: None, normal,Extremity Movements ?Upper (arms, wrists, hands, fingers): None, normal ?Lower (legs, knees, ankles, toes): None, normal, Trunk Movements ?Neck, shoulders, hips: None, normal, Overall Severity ?Severity of abnormal movements (highest score from questions above): None, normal ?Incapacitation due to abnormal movements: None, normal ?Patient's awareness of abnormal movements  (rate only patient's report): No Awareness, Dental Status ?Current problems with teeth and/or dentures?: No ?Does patient usually wear dentures?: No  ?CIWA:    ?COWS:    ? ?Musculoskeletal: ?Strength & Muscle Tone: within normal limits ?Gait & Station: normal ?Patient leans: N/A ? ?Psychiatric Specialty Exam: ? ?Presentation  ?General Appearance: Appropriate for Environment; Casual ? ?Eye Contact:Good ? ?Speech:Clear and Coherent ? ?Speech Volume:Normal ? ?Handedness:Right ? ? ?Mood and Affect  ?Mood:-- ("calm") ? ?Affect:Appropriate; Congruent ? ? ?Thought Process  ?Thought Processes:Coherent ? ?Descriptions of Associations:Circumstantial ? ?Orientation:Full (Time, Place and Person) ? ?Thought Content:Logical ? ?History of Schizophrenia/Schizoaffective disorder:No ? ?Duration of Psychotic Symptoms:No  data recorded ?Hallucinations:Hallucinations: None ? ?Ideas of Reference:None ? ?Suicidal Thoughts:Suicidal Thoughts: No ? ?Homicidal Thoughts:Homicidal Thoughts: No ? ? ?Sensorium  ?Memory:Immediate Fair; Recent Fair ? ?Judgment:Fair ? ?Insight:Fair ? ? ?Executive Functions  ?Concentration:Fair ? ?Attention Span:Fair ? ?Recall:Fair ? ?[place] ? ?Language:Fair ? ? ?Psychomotor Activity  ?Psychomotor Activity:Psychomotor Activity: Normal ? ? ?Assets  ?Assets:Communication Skills; Resilience; Desire for Improvement; Housing; Social Support ? ? ?Sleep  ?Sleep:Sleep: Good ? ? ? ?Physical Exam: ?Physical Exam ?HENT:  ?   Head: Normocephalic and atraumatic.  ?Pulmonary:  ?   Effort: Pulmonary effort is normal.  ?Neurological:  ?   Mental Status: She is alert and oriented to person, place, and time.  ? ?Review of Systems  ?Psychiatric/Behavioral:  Negative for hallucinations and suicidal ideas. The patient does not have insomnia.   ?Blood pressure 111/62, pulse 98, temperature 98.4 ?F (36.9 ?C), temperature source Oral, resp. rate 16, height 5\' 3"  (1.6 m), weight 95.9 kg, last menstrual period 08/16/2021, SpO2  98 %, not currently breastfeeding. Body mass index is 37.45 kg/m?. ? ? ?Treatment Plan Summary: ?Daily contact with patient to assess and evaluate symptoms and progress in treatment and Medication management ? ? ?Pati

## 2021-11-12 NOTE — Progress Notes (Signed)
?   11/11/21 2100  ?Psych Admission Type (Psych Patients Only)  ?Admission Status Voluntary  ?Psychosocial Assessment  ?Patient Complaints Anxiety;Worrying  ?Eye Contact Fair  ?Facial Expression Anxious;Worried  ?Affect Depressed  ?Speech Logical/coherent;Soft  ?Interaction Needy  ?Motor Activity Fidgety;Restless  ?Appearance/Hygiene Unremarkable  ?Behavior Characteristics Cooperative;Appropriate to situation  ?Mood Depressed;Sad  ?Thought Process  ?Coherency Circumstantial  ?Content Blaming self  ?Delusions None reported or observed  ?Perception WDL  ?Hallucination None reported or observed  ?Judgment Limited  ?Confusion None  ?Danger to Self  ?Current suicidal ideation? Denies  ?Self-Injurious Behavior No self-injurious ideation or behavior indicators observed or expressed   ?Agreement Not to Harm Self Yes  ?Description of Agreement verbal contract  ?Danger to Others  ?Danger to Others None reported or observed  ? ? ?

## 2021-11-12 NOTE — Progress Notes (Signed)
DAR NOTE: Patient is calm and appropriate on the unit.  Denies suicidal thoughts, pain, auditory and visual hallucinations.  Rates depression at 0, hopelessness at 0, and anxiety at 0.  Maintained on routine safety checks.  Medications given as prescribed.  Support and encouragement offered as needed. States goal for today is "work on being able to use coping skills."  Patient observed socializing with peers in the hallway.  Offered no complaint.   ? ?

## 2021-11-12 NOTE — Hospital Course (Addendum)
During the patient's hospitalization, patient had extensive initial psychiatric evaluation, and follow-up psychiatric evaluations every day. ? ?Psychiatric diagnoses provided upon initial assessment:  ?Bipolar disorder, rapid cycling  ? ?Patient's psychiatric medications were adjusted on admission:  ?- Continued Prozac 10mg  ? ?During the hospitalization, other adjustments were made to the patient's psychiatric medication regimen:  ?- Spoke with patient about starting mood stabilization; however she endorsed she would prefer her ACTT to make these changes and was able to voice a safe and reasonable follow-up plan. Patient did not have any behavior concerns on the unit. ? ?Gradually, patient started adjusting to milieu.   ?Patient's care was discussed during the interdisciplinary team meeting every day during the hospitalization. ? ?The patient denied having side effects to prescribed psychiatric medication. ? ?The patient reports their target psychiatric symptoms of irritability responded well to the psychiatric medications, and the patient reports overall benefit other psychiatric hospitalization. Supportive psychotherapy was provided to the patient. Patient endorsed improved insight and judgement over the course of hospitalization. ? ?Labs were reviewed with the patient, and abnormal results were discussed with the patient. ? ?The patient denied having suicidal thoughts more than 48 hours prior to discharge.  Patient denies having homicidal thoughts.  Patient denies having auditory hallucinations.  Patient denies any visual hallucinations.  Patient denies having paranoid thoughts. ? ?The patient is able to verbalize their individual safety plan to this provider. ? ?It is recommended to the patient to continue psychiatric medications as prescribed, after discharge from the hospital.   ? ?It is recommended to the patient to follow up with your outpatient psychiatric provider and PCP. ? ?Discussed with the patient,  the impact of alcohol, drugs, tobacco have been there overall psychiatric and medical wellbeing, and total abstinence from substance use was recommended the patient.  ? ? ?

## 2021-11-13 DIAGNOSIS — F319 Bipolar disorder, unspecified: Secondary | ICD-10-CM

## 2021-11-13 NOTE — [facility] Suicide Risk Assessment (Signed)
Suicide Risk Assessment ? ?Discharge Assessment    ?[facility] Discharge Suicide Risk Assessment ? ? ?Principal Problem: Bipolar disorder, rapid cycling (HCC) ?Discharge Diagnoses: Principal Problem: ?  Bipolar disorder, rapid cycling (HCC) ?Active Problems: ?  Anxiety ?  Oppositional defiant disorder ?  Borderline personality disorder (HCC) ?  PTSD (post-traumatic stress disorder) ?  GAD (generalized anxiety disorder) ? ? ?Total Time spent with patient: 15 minutes ? ?During the patient's hospitalization, patient had extensive initial psychiatric evaluation, and follow-up psychiatric evaluations every day. ? ?Psychiatric diagnoses provided upon initial assessment:  ?Bipolar disorder, rapid cycling  ? ?Patient's psychiatric medications were adjusted on admission:  ?- Continued Prozac 10mg  ? ?During the hospitalization, other adjustments were made to the patient's psychiatric medication regimen:  ?- Spoke with patient about starting mood stabilization; however she endorsed she would prefer her ACTT to make these changes and was able to voice a safe and reasonable follow-up plan. Patient did not have any behavior concerns on the unit. ? ?Gradually, patient started adjusting to milieu.   ?Patient's care was discussed during the interdisciplinary team meeting every day during the hospitalization. ? ?The patient denied having side effects to prescribed psychiatric medication. ? ?The patient reports their target psychiatric symptoms of irritability responded well to the psychiatric medications, and the patient reports overall benefit other psychiatric hospitalization. Supportive psychotherapy was provided to the patient. Patient endorsed improved insight and judgement over the course of hospitalization. ? ?Labs were reviewed with the patient, and abnormal results were discussed with the patient. ? ?The patient denied having suicidal thoughts more than 48 hours prior to discharge.  Patient denies having homicidal thoughts.   Patient denies having auditory hallucinations.  Patient denies any visual hallucinations.  Patient denies having paranoid thoughts. ? ?The patient is able to verbalize their individual safety plan to this provider. ? ?It is recommended to the patient to continue psychiatric medications as prescribed, after discharge from the hospital.   ? ?It is recommended to the patient to follow up with your outpatient psychiatric provider and PCP. ? ?Discussed with the patient, the impact of alcohol, drugs, tobacco have been there overall psychiatric and medical wellbeing, and total abstinence from substance use was recommended the patient.  ? ?Musculoskeletal: ?Strength & Muscle Tone: within normal limits ?Gait & Station: normal ?Patient leans: N/A ? ?Psychiatric Specialty Exam ? ?Presentation  ?General Appearance: Appropriate for Environment; Casual ? ?Eye Contact:Good ? ?Speech:Clear and Coherent ? ?Speech Volume:Normal ? ?Handedness:Right ? ? ?Mood and Affect  ?Mood:-- ("calm") ? ?Duration of Depression Symptoms: Greater than two weeks ? ?Affect:Appropriate; Congruent ? ? ?Thought Process  ?Thought Processes:Coherent ? ?Descriptions of Associations:Intact ? ?Orientation:Full (Time, Place and Person) ? ?Thought Content:Logical ? ?History of Schizophrenia/Schizoaffective disorder:No ? ?Duration of Psychotic Symptoms:No data recorded ?Hallucinations:Hallucinations: None ? ?Ideas of Reference:None ? ?Suicidal Thoughts:Suicidal Thoughts: No ? ?Homicidal Thoughts:Homicidal Thoughts: No ? ? ?Sensorium  ?Memory:Immediate Fair; Recent Fair ? ?Judgment:Fair ? ?Insight:Fair ? ? ?Executive Functions  ?Concentration:Fair ? ?Attention Span:Fair ? ?Recall:Fair ? ?Fund of Knowledge:Fair ? ?Language:Fair ? ? ?Psychomotor Activity  ?Psychomotor Activity:Psychomotor Activity: Normal ? ? ?Assets  ?Assets:Communication Skills; Resilience; Desire for Improvement; Housing; Social Support ? ? ?Sleep  ?Sleep:Sleep: Good ? ? ?Physical Exam: ?Physical  Exam ?HENT:  ?   Head: Normocephalic and atraumatic.  ?Pulmonary:  ?   Effort: Pulmonary effort is normal.  ?Neurological:  ?   Mental Status: She is alert and oriented to person, place, and time.  ? ?Review of Systems  ?Psychiatric/Behavioral:  Negative for depression, hallucinations and suicidal ideas.   ?Blood pressure 119/67, pulse 96, temperature 98.3 ?F (36.8 ?C), temperature source Oral, resp. rate 16, height 5\' 3"  (1.6 m), weight 95.9 kg, last menstrual period 08/16/2021, SpO2 100 %, not currently breastfeeding. Body mass index is 37.45 kg/m?. ? ?Mental Status Per Nursing Assessment::   ?On Admission:  Suicidal ideation indicated by patient ? ?Demographic Factors:  ?Adolescent or young adult and Caucasian ? ?Loss Factors: ?Loss of significant relationship ? ?Historical Factors: ?NA ? ?Risk Reduction Factors:   ?Pregnancy ? ?Continued Clinical Symptoms:  ?Denies ? ?Cognitive Features That Contribute To Risk:  ?None   ? ?Suicide Risk:  ?Minimal: No identifiable suicidal ideation.  Patients presenting with no risk factors but with morbid ruminations; may be classified as minimal risk based on the severity of the depressive symptoms ? ? Follow-up Information   ? ? [facility] Call.   ?Why: Participants in program are able to learn about health and social factors that impact their family . Long term individualized support that can include goal setting, parenting support and referrals to other community resources.  Peer group education and support where moms can meet and network twice a month ?Contact information: ?[phone] ?[phone] ext [phone] ?[address] [place] ?[place], [place] ? ?  ?  ? ? [phone]. Call.   ?Why: Call to learn more about adult peer support programming, support groups and peer support classes that can be beneficial to you. ?Contact information: ?[phone] ?[address] ?[address] ?[place], [place] ? ?  ?  ? ? [facility] Follow  up.   ?Why: Please call to learn about peer support and programs that you may qualify for ?Contact information: ?[address] ?[place] ?[phone] ? ? ?  ?  ? ?  ?  ? ?  ? ? ?Plan Of Care/Follow-up recommendations:  ?Patient is instructed to take all prescribed medications as recommended. Report any side effects or adverse reactions to your outpatient psychiatrist. Patient is instructed to abstain from alcohol and illegal drugs while on prescription medications. In the event of worsening symptoms, patient is instructed to call the crisis hotline, 911, 988, or got to [facility], or go to the nearest emergency department for evaluation and treatment.   ? ? ?PGY-2 ?[facility], MD ?11/13/2021, 9:43 AM ?

## 2021-11-13 NOTE — Progress Notes (Signed)
?   11/13/21 0400  ?Psych Admission Type (Psych Patients Only)  ?Admission Status Voluntary  ?Psychosocial Assessment  ?Patient Complaints Anxiety  ?Eye Contact Fair  ?Facial Expression Anxious  ?Affect Depressed  ?Speech Logical/coherent  ?Interaction Assertive  ?Motor Activity Fidgety  ?Appearance/Hygiene Unremarkable  ?Behavior Characteristics Cooperative  ?Mood Depressed  ?Thought Process  ?Coherency Circumstantial  ?Content Blaming self  ?Delusions None reported or observed  ?Perception WDL  ?Hallucination None reported or observed  ?Judgment Limited  ?Confusion None  ?Danger to Self  ?Current suicidal ideation? Denies  ?Self-Injurious Behavior No self-injurious ideation or behavior indicators observed or expressed   ?Agreement Not to Harm Self Yes  ?Description of Agreement verbal contract  ?Danger to Others  ?Danger to Others None reported or observed  ? ? ?

## 2021-11-13 NOTE — Group Note (Signed)
Recreation Therapy Group Note ? ? ?Group Topic:Self-Esteem  ?Group Date: 11/13/2021 ?Start Time: 1110 ?End Time: 1120 ?Facilitators: Jaidah Lomax, LRT,CTRS ?Location:  [address] ? ? ?Activity Description: LRT provided pt a workbook reviewing stress management concepts, identifying triggers to stress and identifying things in and out of their control. Pt given the option to complete the packet in their room at their own pace.  ? ? ?Participation Quality: Independent ?  ? ?Clinical Observations/Individualized Feedback:  Due to illness on unit, patients were given packets dealing with stress management.  Patient were to complete packets at their own pace. ? ? ?Plan: Continue to engage patient in RT group sessions 2-3x/week. ? ? ?Sharon Ward, LRT,CTRS ?11/13/2021 12:17 PM ?

## 2021-11-13 NOTE — Progress Notes (Signed)
Pt discharged to lobby. Pt was stable and appreciative at that time. All papers  were given and valuables returned. Verbal understanding expressed. Denies SI/HI and A/VH. Pt given opportunity to express concerns and ask questions. °

## 2021-11-13 NOTE — Progress Notes (Signed)
?  [facility] Adult Case Management Discharge Plan : ? ?Will you be returning to the same living situation after discharge:  Yes,  to apartment  ?At discharge, do you have transportation home?: No. Bus pass will be provided  ?Do you have the ability to pay for your medications: Yes,  has insurance ? ?Release of information consent forms completed and in the chart;  Patient's signature needed at discharge. ? ?Patient to Follow up at: ? Follow-up Information   ? ? [facility] Call.   ?Why: Participants in program are able to learn about health and social factors that impact their family . Long term individualized support that can include goal setting, parenting support and referrals to other community resources.  Peer group education and support where moms can meet and network twice a month ?Contact information: ?Melonie [place] ?[phone] ext [phone] ?[address] [place] ?[place], [place] [address] ? ?  ?  ? ? [employer]. Call.   ?Why: Call to learn more about adult peer support programming, support groups and peer support classes that can be beneficial to you. ?Contact information: ?[phone] ?[address] ?[address] ?[place], [place] [address] ? ?  ?  ? ? [facility] Follow up.   ?Why: Please call to learn about peer support and programs that you may qualify for ?Contact information: ?[address] ?[place] [address] ?[phone] ? ? ?  ?  ? ?  ?  ? ?  ? ? ?Next level of care provider has access to [facility] Link:no ? ?Safety Planning and Suicide Prevention discussed: Yes,  with ACT Team ? ?  ? ?Has patient been referred to the Quitline?: Patient refused referral ? ?Patient has been referred for addiction treatment: Pt. refused referral ? ?Otelia Santee, LCSW ?11/13/2021, 9:33 AM ?
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
## 2021-11-13 NOTE — Group Note (Signed)
LCSW Group Therapy Note ? ? ?Group Date: 11/13/2021 ?Start Time: 1300 ?End Time: 1400 ? ?Type of Therapy and Topic:  Group Therapy: Anger Management   ?  ?Participation Level:  Active ?  ?Description of Group:   Due to the acuity on the unit, staff recommended no close contact at this time so group was not held.  Patient was provided with written information and worksheets about Anger Management.  CSW reviewed the packet with the Pt and answered all questions.  ? ?Dilraj Killgore M Kiyoko Mcguirt, LCSWA ?11/13/2021  10:53 AM   ? ?

## 2021-11-24 ENCOUNTER — Encounter: Payer: Self-pay | Admitting: *Deleted

## 2021-12-03 ENCOUNTER — Ambulatory Visit (INDEPENDENT_AMBULATORY_CARE_PROVIDER_SITE_OTHER): Payer: Medicaid Other | Admitting: Family Medicine

## 2021-12-03 VITALS — BP 119/70 | HR 75 | Wt 217.8 lb

## 2021-12-03 DIAGNOSIS — R8271 Bacteriuria: Secondary | ICD-10-CM

## 2021-12-03 DIAGNOSIS — O219 Vomiting of pregnancy, unspecified: Secondary | ICD-10-CM

## 2021-12-03 DIAGNOSIS — Z3480 Encounter for supervision of other normal pregnancy, unspecified trimester: Secondary | ICD-10-CM

## 2021-12-03 DIAGNOSIS — J4520 Mild intermittent asthma, uncomplicated: Secondary | ICD-10-CM

## 2021-12-03 MED ORDER — PROMETHAZINE HCL 25 MG PO TABS: 25.0000 mg | ORAL_TABLET | Freq: Four times a day (QID) | ORAL | 1 refills | Status: DC | PRN

## 2021-12-03 NOTE — Patient Instructions (Signed)

## 2021-12-04 NOTE — Progress Notes (Signed)
? ?  PRENATAL VISIT NOTE ? ?Subjective:  ?Sharon Ward is a 26 y.o. G3P1011 at [redacted]w[redacted]d being seen today for ongoing prenatal care.  She is currently monitored for the following issues for this high-risk pregnancy and has Anxiety; LGSIL on Pap smear of cervix; Oppositional defiant disorder; Pilonidal disease; Suicide attempt ([place]); Prolonged QT interval; Intentional drug overdose ([place]); Bipolar 2 disorder, major depressive episode ([place]); Marijuana abuse; Borderline personality disorder ([place]); PTSD (post-traumatic stress disorder); Bipolar I disorder, most recent episode mixed ([place]); GAD (generalized anxiety disorder); Supervision of other normal pregnancy, antepartum; GBS bacteriuria; Bipolar disorder, rapid cycling ([place]); Chronic constipation; Gastroesophageal reflux disease without esophagitis; History of dysphagia; Mild intermittent asthma without complication; and Yeast vaginitis on their problem list. ? ?Patient reports no complaints.  Contractions: Not present. Vag. Bleeding: None.  Movement: Absent. Denies leaking of fluid.  ? ?The following portions of the patient's history were reviewed and updated as appropriate: allergies, current medications, past family history, past medical history, past social history, past surgical history and problem list.  ? ?Objective:  ? ?Vitals:  ? 12/03/21 1639  ?BP: 119/70  ?Pulse: 75  ?Weight: 217 lb 12.8 oz (98.8 kg)  ? ? ?Fetal Status: Fetal Heart Rate (bpm): 143   Movement: Absent    ? ?General:  Alert, oriented and cooperative. Patient is in no acute distress.  ?Skin: Skin is warm and dry. No rash noted.   ?Cardiovascular: Normal heart rate noted  ?Respiratory: Normal respiratory effort, no problems with respiration noted  ?Abdomen: Soft, gravid, appropriate for gestational age.  Pain/Pressure: Absent     ?Pelvic: Cervical exam deferred        ?Extremities: Normal range of motion.  Edema: None  ?Mental Status: Normal mood and affect. Normal behavior. Normal judgment and  thought content.  ? ?Assessment and Plan:  ?Pregnancy: G3P1011 at [redacted]w[redacted]d ?1. Supervision of other normal pregnancy, antepartum ?Continue prenatal care. ? ?2. GBS bacteriuria ?Will need treatment in labor ? ?3. Mild intermittent asthma without complication ?Denies need for inhaler ? ?4. Nausea and vomiting of pregnancy, antepartum ?Add Phenergan ?- promethazine (PHENERGAN) 25 MG tablet; Take 1 tablet (25 mg total) by mouth every 6 (six) hours as needed for nausea or vomiting.  Dispense: 30 tablet; Refill: 1 ? ?5. Bipolar ?Not sleeping well, may try melatonin or Unisom ?Continue Quetiapine and Prozac ? ?General obstetric precautions including but not limited to vaginal bleeding, contractions, leaking of fluid and fetal movement were reviewed in detail with the patient. ?Please refer to After Visit Summary for other counseling recommendations.  ? ?Return in 4 weeks (on 12/31/2021) for [facility]. ? ?Future Appointments  ?Date Time Provider [place]  ?12/31/2021  4:15 PM Donnamae Jude, MD [facility]  ?01/07/2022  1:15 PM [facility] NURSE [facility]  ?01/07/2022  1:30 PM [facility] US3 [facility]  ? ? ?Donnamae Jude, MD ? ?

## 2021-12-15 ENCOUNTER — Encounter: Payer: Self-pay | Admitting: Family Medicine

## 2021-12-31 ENCOUNTER — Ambulatory Visit (INDEPENDENT_AMBULATORY_CARE_PROVIDER_SITE_OTHER): Payer: Medicaid Other | Admitting: Family Medicine

## 2021-12-31 VITALS — BP 121/77 | HR 91 | Wt 224.8 lb

## 2021-12-31 DIAGNOSIS — R8271 Bacteriuria: Secondary | ICD-10-CM

## 2021-12-31 DIAGNOSIS — Z3480 Encounter for supervision of other normal pregnancy, unspecified trimester: Secondary | ICD-10-CM

## 2021-12-31 DIAGNOSIS — F3160 Bipolar disorder, current episode mixed, unspecified: Secondary | ICD-10-CM

## 2022-01-02 LAB — AFP, SERUM, OPEN SPINA BIFIDA
AFP MoM: 1.29
AFP Value: 45.9 ng/mL
Gest. Age on Collection Date: 18.1 weeks
Maternal Age At EDD: 24.7 yr
OSBR Risk 1 IN: 4947
Test Results:: NEGATIVE
Weight: 225 [lb_av]

## 2022-01-02 NOTE — Progress Notes (Signed)
   PRENATAL VISIT NOTE  Subjective:  Sharon Ward is a 26 y.o. G3P1011 at [redacted]w[redacted]d being seen today for ongoing prenatal care.  She is currently monitored for the following issues for this low-risk pregnancy and has Anxiety; LGSIL on Pap smear of cervix; Oppositional defiant disorder; Pilonidal disease; Suicide attempt ([place]); Prolonged QT interval; Intentional drug overdose ([place]); Bipolar 2 disorder, major depressive episode ([place]); Marijuana abuse; Borderline personality disorder ([place]); PTSD (post-traumatic stress disorder); Bipolar I disorder, most recent episode mixed ([place]); GAD (generalized anxiety disorder); Supervision of other normal pregnancy, antepartum; GBS bacteriuria; Bipolar disorder, rapid cycling ([place]); Chronic constipation; Gastroesophageal reflux disease without esophagitis; History of dysphagia; Mild intermittent asthma without complication; and Yeast vaginitis on their problem list.  Patient reports no complaints.  Contractions: Not present. Vag. Bleeding: None.  Movement: Absent. Denies leaking of fluid.   The following portions of the patient's history were reviewed and updated as appropriate: allergies, current medications, past family history, past medical history, past social history, past surgical history and problem list.   Objective:   Vitals:   12/31/21 1537  BP: 121/77  Pulse: 91  Weight: 224 lb 12.8 oz (102 kg)    Fetal Status: Fetal Heart Rate (bpm): 150   Movement: Absent     General:  Alert, oriented and cooperative. Patient is in no acute distress.  Skin: Skin is warm and dry. No rash noted.   Cardiovascular: Normal heart rate noted  Respiratory: Normal respiratory effort, no problems with respiration noted  Abdomen: Soft, gravid, appropriate for gestational age.  Pain/Pressure: Present     Pelvic: Cervical exam deferred        Extremities: Normal range of motion.  Edema: None  Mental Status: Normal mood and affect. Normal behavior. Normal judgment and  thought content.   Assessment and Plan:  Pregnancy: G3P1011 at [redacted]w[redacted]d 1. Supervision of other normal pregnancy, antepartum AFP today Has anatomy u/s scheduled - AFP, Serum, Open Spina Bifida  2. GBS bacteriuria Will need treatment in labor  3. Bipolar I disorder, most recent episode mixed ([place]) On Seroquel  General obstetric precautions including but not limited to vaginal bleeding, contractions, leaking of fluid and fetal movement were reviewed in detail with the patient. Please refer to After Visit Summary for other counseling recommendations.   Return in about 4 weeks (around 01/28/2022) for [facility].  Future Appointments  Date Time Provider [place]  01/07/2022  1:15 PM [facility] NURSE [facility]  01/07/2022  1:30 PM [facility] US3 [facility]  01/29/2022 10:35 AM Deloris Ping, CNM [facility]    Donnamae Jude, MD

## 2022-01-04 ENCOUNTER — Encounter (HOSPITAL_COMMUNITY): Payer: Self-pay | Admitting: Family Medicine

## 2022-01-04 ENCOUNTER — Inpatient Hospital Stay (HOSPITAL_COMMUNITY)
Admission: AD | Admit: 2022-01-04 | Discharge: 2022-01-04 | Disposition: A | Discharge status: Home / Self Care | Payer: Medicaid Other | Attending: Family Medicine | Admitting: Family Medicine

## 2022-01-04 DIAGNOSIS — R112 Nausea with vomiting, unspecified: Secondary | ICD-10-CM | POA: Diagnosis not present

## 2022-01-04 DIAGNOSIS — O26892 Other specified pregnancy related conditions, second trimester: Secondary | ICD-10-CM | POA: Insufficient documentation

## 2022-01-04 DIAGNOSIS — O219 Vomiting of pregnancy, unspecified: Secondary | ICD-10-CM | POA: Diagnosis not present

## 2022-01-04 DIAGNOSIS — Z3A18 18 weeks gestation of pregnancy: Secondary | ICD-10-CM | POA: Insufficient documentation

## 2022-01-04 DIAGNOSIS — Z5941 Food insecurity: Secondary | ICD-10-CM | POA: Insufficient documentation

## 2022-01-04 DIAGNOSIS — R42 Dizziness and giddiness: Secondary | ICD-10-CM | POA: Diagnosis present

## 2022-01-04 LAB — BASIC METABOLIC PANEL
Anion gap: 7 (ref 5–15)
BUN: 6 mg/dL (ref 6–20)
CO2: 24 mmol/L (ref 22–32)
Calcium: 9 mg/dL (ref 8.9–10.3)
Chloride: 107 mmol/L (ref 98–111)
Creatinine, Ser: 0.53 mg/dL (ref 0.44–1.00)
GFR, Estimated: >60 mL/min (ref >60)
Glucose, Bld: 97 mg/dL (ref 70–99)
Potassium: 3.9 mmol/L (ref 3.5–5.1)
Sodium: 138 mmol/L (ref 135–145)

## 2022-01-04 LAB — CBC
HCT: 37.9 % (ref 36.0–46.0)
Hemoglobin: 13.4 g/dL (ref 12.0–15.0)
MCH: 32.8 pg (ref 26.0–34.0)
MCHC: 35.4 g/dL (ref 30.0–36.0)
MCV: 92.7 fL (ref 80.0–100.0)
Platelets: 184 10*3/uL (ref 150–400)
RBC: 4.09 MIL/uL (ref 3.87–5.11)
RDW: 12.7 % (ref 11.5–15.5)
WBC: 8.1 10*3/uL (ref 4.0–10.5)
nRBC: 0 % (ref 0.0–0.2)

## 2022-01-04 LAB — URINALYSIS, ROUTINE W REFLEX MICROSCOPIC
Bilirubin Urine: NEGATIVE
Glucose, UA: NEGATIVE mg/dL
Hgb urine dipstick: NEGATIVE
Ketones, ur: NEGATIVE mg/dL
Leukocytes,Ua: NEGATIVE
Nitrite: NEGATIVE
Protein, ur: NEGATIVE mg/dL
Specific Gravity, Urine: 1.019 (ref 1.005–1.030)
pH: 7 (ref 5.0–8.0)

## 2022-01-04 LAB — GLUCOSE, CAPILLARY: Glucose-Capillary: 91 mg/dL (ref 70–99)

## 2022-01-04 MED ORDER — METOCLOPRAMIDE HCL 10 MG PO TABS
10.0000 mg | ORAL_TABLET | Freq: Once | ORAL | Status: AC
Start: 2022-01-04 — End: 2022-01-04
  Administered 2022-01-04: 10 mg via ORAL
  Filled 2022-01-04: qty 1

## 2022-01-04 MED ORDER — METOCLOPRAMIDE HCL 10 MG PO TABS: 10.0000 mg | ORAL_TABLET | Freq: Four times a day (QID) | ORAL | 0 refills | Status: DC | PRN

## 2022-01-04 NOTE — MAU Note (Signed)
Sharon Ward is a 26 y.o. at [redacted]w[redacted]d here in MAU reporting: on and off for almost 2 wks, has been getting really dizzy, feels like she is going to pass out. Sensitive to light, seeing spots.  Having headaches, when it get bad, she feels nauseated and has thrown up.  Onset of complaint: 2 wks ago Pain score: no Vitals:   01/04/22 0925  BP: 130/66  Pulse: 91  Resp: 17  Temp: 98.4 F (36.9 C)  SpO2: 99%     FHT:138 Lab orders placed from triage:  urine  Did not bring up at her appt the other day.  She asked the provider a question, didn't really get an answer and felt the provider was rude and disrespectful- so did not ask anything else

## 2022-01-04 NOTE — Social Work (Addendum)
CSW received consult for "18 weeks with food insecurities." CSW met with MOB to offer support and complete assessment.    CSW met with the patient at bedside and introduced CSW role. CSW observed MOB sitting up in the bed and welcomed CSW visit. MOB presented calm and interacted appropriately. CSW assessed MOB for needs. MOB reported that she receives a monthly SSI check and has concerns with her payee "not giving me enough." MOB shared that her housing, phone bill and electricity are paid by her payee. Then she receives $100 weekly for spending. MOB expressed after she pays for hygiene products, and supplements for groceries when out of food stamps benefits, she is out of funds. MOB reported that her payee told her that she needs to learn to manage her funds. MOB reported that she has been with her payee for several years and feels she can manage her own funds now. CSW encouraged MOB to talk with her payee about her wishes.  CSW inquired about the amount MOB receives in foods stamps. MOB reported that she receives $207 a month in benefits and receives [facility] benefits. MOB reported is not sure about the amount of WIC benefits since she had not used her card in a while. MOB expressed that she did not know how to use WIC since she was not educated on the items that she can purchase. CSW printed MOB a list of the items that she can purchase using WIC. CSW also assisted MOB with calling the number on her [facility] card to determine benefits. MOB learned that she has [place] funds available to purchase food. CSW encouraged MOB to go to the grocery today or soon since the funds for the month expire on 6/3. MOB reported that she plans to go to the store today. CSW encouraged MOB to ask the grocery store customer service desk for help if needed. MOB reported understanding.   MOB reported that FOB is also very supportive to her. MOB reported that she lives in special housing and has an assigned TCL ([employer])  that provides support. CSW inquired about MOB older child. MOB reported that her two-year-old child currently stays with her father since she recently got housing and wanted to continue prepare before moving her daughter in.   CSW provided MOB with a list of food assistance places for reference, if needed.   MOB was very appreciative for the visit.   No other need identified.   Kathrin Greathouse, MSW, LCSW Women's and [place] Worker  [phone] 01/04/2022  12:56 PM

## 2022-01-04 NOTE — MAU Provider Note (Signed)
History     CSN: [id number]  Arrival date and time: 01/04/22 0915   Event Date/Time   First Provider Initiated Contact with Patient 01/04/22 1030      Chief Complaint  Patient presents with   Dizziness   Nausea   26 y.o. G3P1011 @18 .5 wks presenting with dizziness, weakness, nausea, and headaches. Reports onset about 2 weeks ago. Episodes start with a headache then she becomes dizzy, weak and nauseated. She vomits sometimes. No LOC. HA are usually frontal and bilaterally temporal. No currently having a HA but feels nauseas. She's able to stay well hydrated with water but admits to inconsistent food acquisition. States she has a "payee" who gives her only 100 dollars a week. She has gone to the food pantry at [facility] and another one downtown but states the food received is usually expired. Denies pregnancy concerns.   OB History     Gravida  3   Para  1   Term  1   Preterm      AB  1   Living  1      SAB  1   IAB      Ectopic      Multiple  0   Live Births  1           Past Medical History:  Diagnosis Date   Asthma    Bipolar 1 disorder, mixed (HCC)    Depression    Generalized anxiety disorder    Relationship dysfunction     Past Surgical History:  Procedure Laterality Date   PILONIDAL CYST / SINUS EXCISION  09/11/2013   PILONIDAL CYST EXCISION  05/17/2014   Pilonidal cystectomy with cleft lip    Family History  Problem Relation Age of Onset   Asthma Mother    Diabetes Mother    Healthy Mother    Hypertension Father    Asthma Father    Diabetes Father    Healthy Father     Social History   Tobacco Use   Smoking status: Former    Packs/day: 1.00    Years: 15.00    Pack years: 15.00    Types: Cigarettes   Smokeless tobacco: Never   Tobacco comments:    only smoke a "couple" of cigarettes when stressed or anxious, socially with friends per 07/17/2014 Use   Vaping Use: Never used  Substance Use Topics   Alcohol use: Not  Currently    Comment: occasional prior to pregnancy   Drug use: Not Currently    Frequency: 4.0 times per week    Types: Marijuana    Comment: No Delta 9 since February 2023    Allergies:  Allergies  Allergen Reactions   Ascorbate Rash   Citrus Rash   Coconut Flavor Rash   Lamotrigine Rash   Orange (Diagnostic) Rash   Peach Flavor Rash   Pear Rash   Pineapple Rash    Medications Prior to Admission  Medication Sig Dispense Refill Last Dose   FLUoxetine (PROZAC) 10 MG capsule Take 1 capsule (10 mg total) by mouth daily. 30 capsule 2 01/03/2022   Prenatal Vit-Fe Fumarate-FA (PREPLUS) 27-1 MG TABS Take 1 tablet by mouth at bedtime.   01/03/2022   QUETIAPINE FUMARATE PO Take 75 mg by mouth.   01/03/2022   Blood Pressure Monitoring DEVI 1 each by Does not apply route once a week. 1 each 0    promethazine (PHENERGAN) 25 MG tablet Take 1 tablet (25 mg  total) by mouth every 6 (six) hours as needed for nausea or vomiting. 30 tablet 1     Review of Systems  Constitutional:  Negative for fever.  Eyes:  Positive for photophobia and visual disturbance.  Gastrointestinal:  Positive for nausea and vomiting. Negative for abdominal pain.  Genitourinary:  Negative for vaginal bleeding.  Neurological:  Positive for dizziness, weakness and headaches. Negative for syncope.  Physical Exam   Blood pressure 112/62, pulse 69, temperature 98.4 F (36.9 C), temperature source Oral, resp. rate 19, height 5\' 3"  (1.6 m), weight 101 kg, last menstrual period 08/16/2021, SpO2 99 %, not currently breastfeeding.  Physical Exam Vitals and nursing note reviewed.  Constitutional:      General: She is not in acute distress.    Appearance: Normal appearance.  HENT:     Head: Normocephalic and atraumatic.  Cardiovascular:     Rate and Rhythm: Normal rate.  Pulmonary:     Effort: Pulmonary effort is normal.  Musculoskeletal:        General: Normal range of motion.     Cervical back: Normal range of motion.   Skin:    General: Skin is warm and dry.  Neurological:     General: No focal deficit present.     Mental Status: She is alert and oriented to person, place, and time.     Cranial Nerves: No cranial nerve deficit.  Psychiatric:        Mood and Affect: Mood normal.        Behavior: Behavior normal.  FHT 138  Results for orders placed or performed during the hospital encounter of 01/04/22 (from the past 24 hour(s))  Urinalysis, Routine w reflex microscopic Urine, Clean Catch     Status: Abnormal   Collection Time: 01/04/22  9:38 AM  Result Value Ref Range   Color, Urine YELLOW YELLOW   APPearance HAZY (A) CLEAR   Specific Gravity, Urine 1.019 1.005 - 1.030   pH 7.0 5.0 - 8.0   Glucose, UA NEGATIVE NEGATIVE mg/dL   Hgb urine dipstick NEGATIVE NEGATIVE   Bilirubin Urine NEGATIVE NEGATIVE   Ketones, ur NEGATIVE NEGATIVE mg/dL   Protein, ur NEGATIVE NEGATIVE mg/dL   Nitrite NEGATIVE NEGATIVE   Leukocytes,Ua NEGATIVE NEGATIVE  Glucose, capillary     Status: None   Collection Time: 01/04/22 10:29 AM  Result Value Ref Range   Glucose-Capillary 91 70 - 99 mg/dL  Basic metabolic panel     Status: None   Collection Time: 01/04/22 10:31 AM  Result Value Ref Range   Sodium 138 135 - 145 mmol/L   Potassium 3.9 3.5 - 5.1 mmol/L   Chloride 107 98 - 111 mmol/L   CO2 24 22 - 32 mmol/L   Glucose, Bld 97 70 - 99 mg/dL   BUN 6 6 - 20 mg/dL   Creatinine, Ser 0.980.53 0.44 - 1.00 mg/dL   Calcium 9.0 8.9 - 11.910.3 mg/dL   GFR, Estimated >14>60 >78>60 mL/min   Anion gap 7 5 - 15  CBC     Status: None   Collection Time: 01/04/22 10:31 AM  Result Value Ref Range   WBC 8.1 4.0 - 10.5 K/uL   RBC 4.09 3.87 - 5.11 MIL/uL   Hemoglobin 13.4 12.0 - 15.0 g/dL   HCT 29.537.9 62.136.0 - 30.846.0 %   MCV 92.7 80.0 - 100.0 fL   MCH 32.8 26.0 - 34.0 pg   MCHC 35.4 30.0 - 36.0 g/dL   RDW 65.712.7 84.611.5 -  15.5 %   Platelets 184 150 - 400 K/uL   nRBC 0.0 0.0 - 0.2 %   MAU Course  Procedures Reglan po  MDM Labs ordered and  reviewed. No acute process identified. Nausea improved. SW in to help pt understand process for food acquisition. Encouraged regular meals and snacks. Prolonged periods of fasting may be causing her sx. Stable for discharge home.   Assessment and Plan   1. [redacted] weeks gestation of pregnancy   2. Food insecurity   3. Nausea/vomiting in pregnancy    Discharge home [facility] as scheduled Rx Reglan Return precautions  Allergies as of 01/04/2022       Reactions   Ascorbate Rash   Citrus Rash   Coconut Flavor Rash   Lamotrigine Rash   Orange (diagnostic) Rash   Peach Flavor Rash   Pear Rash   Pineapple Rash        Medication List     TAKE these medications    Blood Pressure Monitoring Devi 1 each by Does not apply route once a week.   FLUoxetine 10 MG capsule Commonly known as: PROZAC Take 1 capsule (10 mg total) by mouth daily.   metoCLOPramide 10 MG tablet Commonly known as: Reglan Take 1 tablet (10 mg total) by mouth every 6 (six) hours as needed for nausea.   PrePLUS 27-1 MG Tabs Take 1 tablet by mouth at bedtime.   promethazine 25 MG tablet Commonly known as: PHENERGAN Take 1 tablet (25 mg total) by mouth every 6 (six) hours as needed for nausea or vomiting.   QUETIAPINE FUMARATE PO Take 75 mg by mouth.        Donette Larry, CNM 01/04/2022, 12:21 PM

## 2022-01-07 ENCOUNTER — Ambulatory Visit: Payer: Medicaid Other | Admitting: *Deleted

## 2022-01-07 ENCOUNTER — Ambulatory Visit: Payer: Medicaid Other | Attending: Family Medicine

## 2022-01-07 ENCOUNTER — Ambulatory Visit (HOSPITAL_BASED_OUTPATIENT_CLINIC_OR_DEPARTMENT_OTHER): Payer: Medicaid Other | Admitting: Obstetrics and Gynecology

## 2022-01-07 ENCOUNTER — Encounter: Payer: Self-pay | Admitting: Family Medicine

## 2022-01-07 VITALS — BP 133/64 | HR 79

## 2022-01-07 DIAGNOSIS — O99322 Drug use complicating pregnancy, second trimester: Secondary | ICD-10-CM

## 2022-01-07 DIAGNOSIS — Z3480 Encounter for supervision of other normal pregnancy, unspecified trimester: Secondary | ICD-10-CM

## 2022-01-07 DIAGNOSIS — O358XX0 Maternal care for other (suspected) fetal abnormality and damage, not applicable or unspecified: Secondary | ICD-10-CM | POA: Insufficient documentation

## 2022-01-07 DIAGNOSIS — O4440 Low lying placenta NOS or without hemorrhage, unspecified trimester: Secondary | ICD-10-CM

## 2022-01-07 DIAGNOSIS — O4442 Low lying placenta NOS or without hemorrhage, second trimester: Secondary | ICD-10-CM | POA: Diagnosis not present

## 2022-01-07 DIAGNOSIS — Z363 Encounter for antenatal screening for malformations: Secondary | ICD-10-CM | POA: Diagnosis not present

## 2022-01-07 DIAGNOSIS — O283 Abnormal ultrasonic finding on antenatal screening of mother: Secondary | ICD-10-CM

## 2022-01-07 DIAGNOSIS — Z3A19 19 weeks gestation of pregnancy: Secondary | ICD-10-CM | POA: Diagnosis not present

## 2022-01-07 DIAGNOSIS — O99212 Obesity complicating pregnancy, second trimester: Secondary | ICD-10-CM

## 2022-01-07 HISTORY — DX: Low lying placenta nos or without hemorrhage, unspecified trimester: O44.40

## 2022-01-07 NOTE — Progress Notes (Signed)
Maternal-Fetal Medicine   Name: Sharon Ward DOB: [birth year] MRN: [id number] Referring Provider: Tinnie Gens, MD  I had the pleasure of seeing [name] [facility]. G3 P1. Patient is here for fetal anatomy scan. She was companied by her case Production designer, theatre/television/film. On cell-free fetal DNA screening, the risks of fetal aneuploidies are not increased. MSAFP screening showed low risk for open-neural tube defects. Past medical history significant for bipolar disorder.  Patient takes quetiapine and fluoxetine. Obstetrical history is significant for a term vaginal delivery in 2021 of a female infant weighing 7 pounds and 6 ounces at birth.  Her daughter is in good health. Patient gives no history of vaginal bleeding. We performed a fetal anatomy scan.  An echogenic intracardiac focus is seen.  No other markers of aneuploidies or fetal structural defects are seen. Fetal biometry is consistent with her previously-established dates. Amniotic fluid is normal and good fetal activity is seen.  Placenta is low-lying. As maternal obesity imposes limitations on the resolution of images, fetal anomalies may be missed. Patient understands the limitations of ultrasound in detecting fetal anomalies.   Echogenic intracardiac focus Echogenic intracardiac focus is seen in about 2% to 3% of normal fetuses (more in Asian population), and in about 15%-20% of fetuses with Down syndrome. She was reassured that echogenic focus is not associated with any structural heart malformations. Given that she has low risk for fetal Down syndrome on cell-free fetal DNA screening, this should be considered a normal variant.  I reassured the patient and informed her that only amniocentesis will give a definitive result on the fetal karyotype.  I did not recommend amniocentesis for this finding Medication exposure Quetiapine being is an antipsychotic agent.  It is not associated with increased risk of congenital  malformations.  Benefit of treatment with this drug in pregnancy seems to outweigh the risks. Fluoxetine is a selective serotonin reuptake inhibitor (SSRI) used for the treatment of depression. It is not a major teratogen and congenital malformations are not expected to increase with this drug. A small absolute increase in primary pulmonary hypertension has been observed. However, untreated depression can be associated with adverse maternal outcomes and it should be continued in pregnancy. Patient was counseled on the small risk for primary pulmonary hypertension.  SSRIs have been associated with neonatal withdrawal syndrome.  Low-lying placenta I reassured her that low-lying placenta usually resolves with advancing gestation.  We will reassess placental position later in pregnancy.  Recommendations -An appointment was made for her to return in 4 weeks for completion of fetal anatomy. -Fetal growth assessment [redacted] weeks gestation.  Thank you for consultation.  If you have any questions or concerns, please contact me the [facility].  Consultation including face-to-face (more than 50%) counseling 30 minutes.

## 2022-01-08 ENCOUNTER — Other Ambulatory Visit: Payer: Self-pay | Admitting: *Deleted

## 2022-01-08 DIAGNOSIS — Z362 Encounter for other antenatal screening follow-up: Secondary | ICD-10-CM

## 2022-01-14 ENCOUNTER — Encounter (HOSPITAL_COMMUNITY): Payer: Self-pay | Admitting: Family Medicine

## 2022-01-14 ENCOUNTER — Inpatient Hospital Stay (HOSPITAL_BASED_OUTPATIENT_CLINIC_OR_DEPARTMENT_OTHER): Payer: Medicaid Other

## 2022-01-14 ENCOUNTER — Inpatient Hospital Stay (HOSPITAL_COMMUNITY)
Admission: AD | Admit: 2022-01-14 | Discharge: 2022-01-15 | Disposition: A | Discharge status: Home / Self Care | Payer: Medicaid Other | Attending: Family Medicine | Admitting: Family Medicine

## 2022-01-14 DIAGNOSIS — Z3A20 20 weeks gestation of pregnancy: Secondary | ICD-10-CM | POA: Diagnosis not present

## 2022-01-14 DIAGNOSIS — O26892 Other specified pregnancy related conditions, second trimester: Secondary | ICD-10-CM | POA: Insufficient documentation

## 2022-01-14 DIAGNOSIS — O26899 Other specified pregnancy related conditions, unspecified trimester: Secondary | ICD-10-CM | POA: Diagnosis not present

## 2022-01-14 DIAGNOSIS — F319 Bipolar disorder, unspecified: Secondary | ICD-10-CM | POA: Diagnosis not present

## 2022-01-14 DIAGNOSIS — Z79899 Other long term (current) drug therapy: Secondary | ICD-10-CM | POA: Diagnosis not present

## 2022-01-14 DIAGNOSIS — R102 Pelvic and perineal pain: Secondary | ICD-10-CM | POA: Insufficient documentation

## 2022-01-14 LAB — URINALYSIS, ROUTINE W REFLEX MICROSCOPIC
Bilirubin Urine: NEGATIVE
Glucose, UA: NEGATIVE mg/dL
Hgb urine dipstick: NEGATIVE
Ketones, ur: 5 mg/dL — AB
Nitrite: NEGATIVE
Protein, ur: NEGATIVE mg/dL
Specific Gravity, Urine: 1.01 (ref 1.005–1.030)
pH: 6 (ref 5.0–8.0)

## 2022-01-14 MED ORDER — ACETAMINOPHEN 500 MG PO TABS
1000.0000 mg | ORAL_TABLET | Freq: Once | ORAL | Status: AC
  Administered 2022-01-14: 1000 mg via ORAL
  Filled 2022-01-14: qty 2

## 2022-01-14 NOTE — MAU Provider Note (Signed)
History     XN:[id number]  Arrival date and time: 01/14/22 2044    Chief Complaint  Patient presents with   Pelvic Pain     HPI Grishma Baynes is a 26 y.o. at [redacted]w[redacted]d who presents for pelvic & back pain. Symptoms started this morning while walking around the mall. Reports low back & pelvic pain occurs when walking & moving. Denies n/v/d, dysuria, hematuria, vaginal bleeding, LOF, or vaginal bleeding. Hasn't treated symptoms.    OB History     Gravida  3   Para  1   Term  1   Preterm      AB  1   Living  1      SAB  1   IAB      Ectopic      Multiple  0   Live Births  1           Past Medical History:  Diagnosis Date   Asthma    Bipolar 1 disorder, mixed ([place])    Depression    Generalized anxiety disorder    Relationship dysfunction     Past Surgical History:  Procedure Laterality Date   PILONIDAL CYST / SINUS EXCISION  09/11/2013   PILONIDAL CYST EXCISION  05/17/2014   Pilonidal cystectomy with cleft lip    Family History  Problem Relation Age of Onset   Asthma Mother    Diabetes Mother    Healthy Mother    Hypertension Father    Asthma Father    Diabetes Father    Healthy Father    Asthma Brother    Hypertension Paternal Uncle    Diabetes Paternal Grandmother    Hypertension Paternal Grandfather    Diabetes Paternal Grandfather     Allergies  Allergen Reactions   Ascorbate Rash   Citrus Rash   Coconut Flavor Rash   Lamotrigine Rash   Orange (Diagnostic) Rash   Peach Flavor Rash   Pear Rash   Pineapple Rash    No current facility-administered medications on file prior to encounter.   Current Outpatient Medications on File Prior to Encounter  Medication Sig Dispense Refill   FLUoxetine (PROZAC) 10 MG capsule Take 1 capsule (10 mg total) by mouth daily. 30 capsule 2   Prenatal Vit-Fe Fumarate-FA (PREPLUS) 27-1 MG TABS Take 1 tablet by mouth at bedtime.     QUETIAPINE FUMARATE PO Take 75 mg by mouth.     Blood Pressure  Monitoring DEVI 1 each by Does not apply route once a week. 1 each 0     ROS Pertinent positives and negative per HPI, all others reviewed and negative  Physical Exam   BP (!) 128/58   Pulse 87   Temp 98 F (36.7 C) (Oral)   Resp 17   Ht 5\' 3"  (1.6 m)   Wt 102 kg   LMP 08/16/2021 (Exact Date)   SpO2 95%   BMI 39.84 kg/m   Patient Vitals for the past 24 hrs:  BP Temp Temp src Pulse Resp SpO2 Height Weight  01/15/22 0011 (!) 128/58 -- -- 87 -- -- -- --  01/14/22 2207 125/66 -- -- 83 17 95 % -- --  01/14/22 2108 (!) 116/58 98 F (36.7 C) Oral 80 17 100 % 5\' 3"  (1.6 m) 102 kg    Physical Exam Vitals and nursing note reviewed.  Constitutional:      General: She is not in acute distress.    Appearance: Normal appearance.  HENT:  Head: Normocephalic and atraumatic.  Eyes:     General: No scleral icterus.    Conjunctiva/sclera: Conjunctivae normal.  Pulmonary:     Effort: Pulmonary effort is normal. No respiratory distress.  Abdominal:     Palpations: Abdomen is soft.     Tenderness: There is abdominal tenderness in the left lower quadrant. There is no right CVA tenderness, left CVA tenderness, guarding or rebound.  Skin:    General: Skin is warm and dry.  Neurological:     Mental Status: She is alert.  Psychiatric:        Mood and Affect: Mood normal.     Labs Results for orders placed or performed during the hospital encounter of 01/14/22 (from the past 24 hour(s))  Urinalysis, Routine w reflex microscopic Urine, Clean Catch     Status: Abnormal   Collection Time: 01/14/22  9:57 PM  Result Value Ref Range   Color, Urine YELLOW YELLOW   APPearance HAZY (A) CLEAR   Specific Gravity, Urine 1.010 1.005 - 1.030   pH 6.0 5.0 - 8.0   Glucose, UA NEGATIVE NEGATIVE mg/dL   Hgb urine dipstick NEGATIVE NEGATIVE   Bilirubin Urine NEGATIVE NEGATIVE   Ketones, ur 5 (A) NEGATIVE mg/dL   Protein, ur NEGATIVE NEGATIVE mg/dL   Nitrite NEGATIVE NEGATIVE   Leukocytes,Ua  TRACE (A) NEGATIVE   RBC / HPF 0-5 0 - 5 RBC/hpf   WBC, UA 0-5 0 - 5 WBC/hpf   Bacteria, UA MANY (A) NONE SEEN   Squamous Epithelial / LPF 0-5 0 - 5    Imaging No results found.  MAU Course  Procedures Lab Orders         Culture, OB Urine         Urinalysis, Routine w reflex microscopic Urine, Clean Catch     Meds ordered this encounter  Medications   acetaminophen (TYLENOL) tablet 1,000 mg   Imaging Orders         [place] MFM OB LIMITED      MDM FHT present via doppler Ultrasound shows placental edge 2.5 cm from os, cervical length 3.5 cm.  Back pain tx with heat & tylenol in MAU. Improved symptoms.  U/a with trace leuks - will send urine for culture.  Assessment and Plan   1. Pain of round ligament affecting pregnancy, antepartum   2. [redacted] weeks gestation of pregnancy    -Recommend maternity support belt -Reviewed reasons to return to MAU -Urine culture pending   Jorje Guild, NP 01/15/22 12:31 AM

## 2022-01-14 NOTE — MAU Note (Signed)
..  Sharon Ward is a 26 y.o. at [redacted]w[redacted]d here in MAU reporting: Was walking around mall today and started having pelvic pain, pressure, and back pain. The back pain is sharp and hurts more when she attempts to get up. The pelvic pain hurts more when she is walking Has taken any medication for the pain.  Denies vaginal bleeding or leaking of fluid.  Expresses that she doesn't have help because the FOB is not involved.    Pain score: 8/10 back pain when attempting to get up. 5/10 when she is walking  Vitals:   01/14/22 2108  BP: (!) 116/58  Pulse: 80  Resp: 17  Temp: 98 F (36.7 C)  SpO2: 100%     FHT:151 Lab orders placed from triage: UA

## 2022-01-15 DIAGNOSIS — O26899 Other specified pregnancy related conditions, unspecified trimester: Secondary | ICD-10-CM | POA: Diagnosis not present

## 2022-01-15 DIAGNOSIS — R102 Pelvic and perineal pain: Secondary | ICD-10-CM

## 2022-01-15 DIAGNOSIS — Z3A20 20 weeks gestation of pregnancy: Secondary | ICD-10-CM

## 2022-01-15 NOTE — Discharge Instructions (Signed)
Recommend pregnancy support belt/maternity support belt.  One brand is called Prenatal Cradle You may find these on [employer].com, [employer].com, or [employer].com  

## 2022-01-16 LAB — CULTURE, OB URINE
Culture: <10000 — AB
Special Requests: NORMAL

## 2022-01-29 ENCOUNTER — Encounter: Payer: Self-pay | Admitting: General Practice

## 2022-01-29 ENCOUNTER — Encounter: Payer: Medicaid Other | Admitting: Certified Nurse Midwife

## 2022-02-01 ENCOUNTER — Encounter: Payer: Self-pay | Admitting: Family Medicine

## 2022-02-01 ENCOUNTER — Ambulatory Visit (INDEPENDENT_AMBULATORY_CARE_PROVIDER_SITE_OTHER): Payer: Medicaid Other | Admitting: Family Medicine

## 2022-02-01 ENCOUNTER — Other Ambulatory Visit (HOSPITAL_COMMUNITY)
Admission: RE | Admit: 2022-02-01 | Discharge: 2022-02-01 | Disposition: A | Discharge status: Home / Self Care | Payer: Medicaid Other | Source: Ambulatory Visit | Attending: Certified Nurse Midwife | Admitting: Certified Nurse Midwife

## 2022-02-01 VITALS — BP 105/68 | HR 80 | Wt 228.8 lb

## 2022-02-01 DIAGNOSIS — N898 Other specified noninflammatory disorders of vagina: Secondary | ICD-10-CM | POA: Diagnosis present

## 2022-02-01 DIAGNOSIS — O283 Abnormal ultrasonic finding on antenatal screening of mother: Secondary | ICD-10-CM

## 2022-02-01 DIAGNOSIS — O4440 Low lying placenta NOS or without hemorrhage, unspecified trimester: Secondary | ICD-10-CM

## 2022-02-01 DIAGNOSIS — R8271 Bacteriuria: Secondary | ICD-10-CM

## 2022-02-01 DIAGNOSIS — Z3480 Encounter for supervision of other normal pregnancy, unspecified trimester: Secondary | ICD-10-CM

## 2022-02-01 DIAGNOSIS — F319 Bipolar disorder, unspecified: Secondary | ICD-10-CM

## 2022-02-02 LAB — CERVICOVAGINAL ANCILLARY ONLY
Bacterial Vaginitis (gardnerella): NEGATIVE
Candida Glabrata: NEGATIVE
Candida Vaginitis: POSITIVE — AB
Chlamydia: NEGATIVE
Comment: NEGATIVE
Comment: NEGATIVE
Comment: NEGATIVE
Comment: NEGATIVE
Comment: NEGATIVE
Comment: NORMAL
Neisseria Gonorrhea: NEGATIVE
Trichomonas: NEGATIVE

## 2022-02-03 ENCOUNTER — Ambulatory Visit: Payer: Medicaid Other | Attending: Obstetrics and Gynecology

## 2022-02-03 ENCOUNTER — Ambulatory Visit: Payer: Medicaid Other | Admitting: *Deleted

## 2022-02-03 ENCOUNTER — Other Ambulatory Visit: Payer: Self-pay | Admitting: *Deleted

## 2022-02-03 VITALS — BP 110/56 | HR 93

## 2022-02-03 DIAGNOSIS — O358XX0 Maternal care for other (suspected) fetal abnormality and damage, not applicable or unspecified: Secondary | ICD-10-CM | POA: Diagnosis not present

## 2022-02-03 DIAGNOSIS — O99322 Drug use complicating pregnancy, second trimester: Secondary | ICD-10-CM | POA: Diagnosis not present

## 2022-02-03 DIAGNOSIS — O99342 Other mental disorders complicating pregnancy, second trimester: Secondary | ICD-10-CM | POA: Insufficient documentation

## 2022-02-03 DIAGNOSIS — Z363 Encounter for antenatal screening for malformations: Secondary | ICD-10-CM | POA: Diagnosis present

## 2022-02-03 DIAGNOSIS — O35BXX0 Maternal care for other (suspected) fetal abnormality and damage, fetal cardiac anomalies, not applicable or unspecified: Secondary | ICD-10-CM | POA: Diagnosis not present

## 2022-02-03 DIAGNOSIS — E669 Obesity, unspecified: Secondary | ICD-10-CM

## 2022-02-03 DIAGNOSIS — F1210 Cannabis abuse, uncomplicated: Secondary | ICD-10-CM | POA: Diagnosis not present

## 2022-02-03 DIAGNOSIS — Z362 Encounter for other antenatal screening follow-up: Secondary | ICD-10-CM | POA: Insufficient documentation

## 2022-02-03 DIAGNOSIS — O99212 Obesity complicating pregnancy, second trimester: Secondary | ICD-10-CM

## 2022-02-03 DIAGNOSIS — O4442 Low lying placenta NOS or without hemorrhage, second trimester: Secondary | ICD-10-CM | POA: Insufficient documentation

## 2022-02-03 DIAGNOSIS — Z3A23 23 weeks gestation of pregnancy: Secondary | ICD-10-CM | POA: Diagnosis not present

## 2022-02-03 DIAGNOSIS — Z3480 Encounter for supervision of other normal pregnancy, unspecified trimester: Secondary | ICD-10-CM | POA: Insufficient documentation

## 2022-02-03 MED ORDER — FLUCONAZOLE 150 MG PO TABS: 150.0000 mg | ORAL_TABLET | Freq: Once | ORAL | 0 refills | Status: AC

## 2022-02-03 NOTE — Addendum Note (Signed)
Addended by: Merian Capron on: 02/03/2022 08:32 AM   Modules accepted: Orders

## 2022-02-03 NOTE — Progress Notes (Signed)
Pt says she feels the baby move 3x/day, once a week, for the past two weeks.  She told her Dr. This and He said that was normal.

## 2022-02-08 ENCOUNTER — Other Ambulatory Visit: Payer: Self-pay

## 2022-02-08 ENCOUNTER — Encounter (HOSPITAL_COMMUNITY): Payer: Self-pay | Admitting: Family Medicine

## 2022-02-08 ENCOUNTER — Inpatient Hospital Stay (HOSPITAL_COMMUNITY)
Admission: AD | Admit: 2022-02-08 | Discharge: 2022-02-08 | Disposition: A | Discharge status: Home / Self Care | Payer: Medicaid Other | Attending: Family Medicine | Admitting: Family Medicine

## 2022-02-08 DIAGNOSIS — O26892 Other specified pregnancy related conditions, second trimester: Secondary | ICD-10-CM | POA: Diagnosis present

## 2022-02-08 DIAGNOSIS — O26899 Other specified pregnancy related conditions, unspecified trimester: Secondary | ICD-10-CM

## 2022-02-08 DIAGNOSIS — R109 Unspecified abdominal pain: Secondary | ICD-10-CM

## 2022-02-08 DIAGNOSIS — Z3A23 23 weeks gestation of pregnancy: Secondary | ICD-10-CM | POA: Diagnosis not present

## 2022-02-08 DIAGNOSIS — O4440 Low lying placenta NOS or without hemorrhage, unspecified trimester: Secondary | ICD-10-CM

## 2022-02-08 LAB — URINALYSIS, ROUTINE W REFLEX MICROSCOPIC
Bilirubin Urine: NEGATIVE
Glucose, UA: NEGATIVE mg/dL
Hgb urine dipstick: NEGATIVE
Ketones, ur: NEGATIVE mg/dL
Nitrite: NEGATIVE
Protein, ur: 30 mg/dL — AB
Specific Gravity, Urine: 1.023 (ref 1.005–1.030)
pH: 6 (ref 5.0–8.0)

## 2022-02-08 MED ORDER — CYCLOBENZAPRINE HCL 5 MG PO TABS
5.0000 mg | ORAL_TABLET | Freq: Two times a day (BID) | ORAL | 0 refills | Status: DC | PRN
Start: 2022-02-08 — End: 2022-04-16

## 2022-02-08 MED ORDER — CYCLOBENZAPRINE HCL 5 MG PO TABS
10.0000 mg | ORAL_TABLET | Freq: Once | ORAL | Status: AC
  Administered 2022-02-08: 10 mg via ORAL
  Filled 2022-02-08: qty 2

## 2022-02-08 NOTE — MAU Provider Note (Cosign Needed Addendum)
History     CSN: [id number]  Arrival date and time: 02/08/22 0913   Chief Complaint  Patient presents with   Abdominal Pain   Sharon Ward is a 26 yo G3P1011 at [redacted]w[redacted]d presenting for evaluation of abdominal cramping.   She reports that she had sex yesterday afternoon around 2-3 pm, and a few hours later she started having cramps. The cramps impacting her ability to regular functions when they occurred. Cramps coming in waves 30 min - 2 hrs apart. Cramps lasting 25-30 seconds. Appreciates that baby was moving less briefly, but feels regular movement as usual now. Denies any vaginal bleeding or leakage of fluid. Hasn't taken any meds for the cramping. Rates pain as a 4-5/10 but got as high as 7-8/10. Pain located in groin/hips/pelvis. Initially started on her middle upper abdomen, now more suprapubic.   Patient notes that she had some burning after sex while peeing, but hasn't had any issues with peeing after that. Normal state of health prior to, no infectious symptoms, or fevers, reports regular bowel movements, daily.   Has a low lying placenta (1.4cm away from os), but has never had any spotting or bleeding. No issues with this pregnancy.   OB History     Gravida  3   Para  1   Term  1   Preterm      AB  1   Living  1      SAB  1   IAB      Ectopic      Multiple  0   Live Births  1           Past Medical History:  Diagnosis Date   Asthma    Bipolar 1 disorder, mixed (HCC)    Depression    Generalized anxiety disorder    Relationship dysfunction     Past Surgical History:  Procedure Laterality Date   PILONIDAL CYST / SINUS EXCISION  09/11/2013   PILONIDAL CYST EXCISION  05/17/2014   Pilonidal cystectomy with cleft lip    Family History  Problem Relation Age of Onset   Asthma Mother    Diabetes Mother    Healthy Mother    Hypertension Father    Asthma Father    Diabetes Father    Healthy Father    Asthma Brother    Hypertension Paternal Uncle     Diabetes Paternal Grandmother    Hypertension Paternal Grandfather    Diabetes Paternal Grandfather     Social History   Tobacco Use   Smoking status: Former    Packs/day: 1.00    Years: 15.00    Total pack years: 15.00    Types: Cigarettes    Quit date: 07/14/2021    Years since quitting: 0.5   Smokeless tobacco: Never   Tobacco comments:    only smoke a "couple" of cigarettes when stressed or anxious, socially with friends per [facility] chart  Vaping Use   Vaping Use: Never used  Substance Use Topics   Alcohol use: Not Currently    Comment: occasional prior to pregnancy   Drug use: Not Currently    Frequency: 4.0 times per week    Types: Marijuana    Comment: No Delta 9 since February 2023    Allergies:  Allergies  Allergen Reactions   Ascorbate Rash   Citrus Rash   Coconut Flavor Rash   Lamotrigine Rash   Orange (Diagnostic) Rash   Peach Flavor Rash   Pear Rash  Pineapple Rash    Medications Prior to Admission  Medication Sig Dispense Refill Last Dose   Blood Pressure Monitoring DEVI 1 each by Does not apply route once a week. 1 each 0 Past Week   FLUoxetine (PROZAC) 10 MG capsule Take 1 capsule (10 mg total) by mouth daily. 30 capsule 2 02/07/2022   Prenatal Vit-Fe Fumarate-FA (PREPLUS) 27-1 MG TABS Take 1 tablet by mouth at bedtime.   02/07/2022   QUETIAPINE FUMARATE PO Take 75 mg by mouth.   02/07/2022    Review of Systems  Constitutional:  Negative for fever.  Respiratory:  Negative for shortness of breath.   Cardiovascular:  Positive for chest pain.  Gastrointestinal:  Negative for constipation, diarrhea, nausea and vomiting.  Genitourinary:  Negative for vaginal bleeding and vaginal discharge.  Neurological:  Negative for dizziness, weakness and light-headedness.   Physical Exam   Blood pressure 124/69, pulse 91, temperature 98 F (36.7 C), temperature source Oral, resp. rate 16, height 5\' 3"  (1.6 m), weight 104.5 kg, last menstrual period 08/16/2021, SpO2 95  %.  Physical Exam Constitutional:      General: She is not in acute distress.    Appearance: She is well-developed. She is not ill-appearing.  HENT:     Mouth/Throat:     Mouth: Mucous membranes are moist.  Eyes:     Extraocular Movements: Extraocular movements intact.  Cardiovascular:     Rate and Rhythm: Normal rate and regular rhythm.     Heart sounds: Normal heart sounds. No murmur heard.    No friction rub. No gallop.  Pulmonary:     Effort: Pulmonary effort is normal. No respiratory distress.     Breath sounds: Normal breath sounds. No stridor. No wheezing or rhonchi.  Abdominal:     General: Bowel sounds are normal.     Palpations: Abdomen is soft.     Comments: Mild generalized abdominal tenderness to lower abdomen. No tenderness in McBurney's point. No rebounding or guarding with exam.   Genitourinary:    Comments: Cervical Exam: closed, thick, posterior Skin:    General: Skin is warm.  Neurological:     General: No focal deficit present.     Mental Status: She is alert.  Psychiatric:        Mood and Affect: Mood normal.        Behavior: Behavior normal.      MAU Course   MDM Encourage fluids PO Flexeril 10 mg for cramping   Assessment and Plan  Abdominal Cramping Patient reporting abdominal cramping hours after sexual intercourse. Patient cervix closed, thick and posterior, no concern for preterm labor at this time. Patient UA was WNL, no concern for UTI at this time. Patient cramping improved with PO fluids and flexeril. Will send patient home with PRN flexeril   10/14/2021 02/08/2022, 11:04 AM    GME ATTESTATION:  I saw and evaluated the patient. I agree with the findings and the plan of care as documented in the resident's note and in addition:   Abdominal cramping after recent unprotected intercourse with partner. No vaginal bleeding, LOF, or discharge. Normal fetal movement.   NST:  Baseline 150 bpm  Moderate variability  Several 10x10  No  decelerations  No contractions seen on toco   A/P: Abdominal cramping in second trimester, in setting of intercourse. Abdomen benign. No contractions on toco. Cervix closed. NST reassuring for gestational age. Resolved with flexeril and fluids, flexeril Rx sent for PRN.   Discharged home  in stable condition. MAU return precautions discussed.   Leticia Penna, DO OB Fellow, Faculty [facility], [facility] 02/08/2022 3:51 PM

## 2022-02-08 NOTE — MAU Note (Addendum)
Sharon Ward is a 26 y.o. at [redacted]w[redacted]d here in MAU reporting: had sex yesterday around 1400 and a couple hours later started cramping. Cramping is intermittent and had gotten better until the patient was walking to MAU from the bus stop. Denies bleeding or LOF. DFM. Pt reports she has placenta previa.  Onset of complaint: yesterday  Pain score: 6/10, intermittent and occurs with movement  Vitals:   02/08/22 0937  BP: 124/69  Pulse: 91  Resp: 16  Temp: 98 F (36.7 C)  SpO2: 95%     FHT:EFM applied in room  Lab orders placed from triage: UA

## 2022-02-08 NOTE — Discharge Instructions (Signed)
Please return to the MAU if you have any vaginal bleeding, leakage of fluid, persistent painful contractions or any other concern.

## 2022-02-27 ENCOUNTER — Inpatient Hospital Stay (HOSPITAL_BASED_OUTPATIENT_CLINIC_OR_DEPARTMENT_OTHER): Payer: Medicaid Other

## 2022-02-27 ENCOUNTER — Inpatient Hospital Stay (HOSPITAL_COMMUNITY)
Admission: AD | Admit: 2022-02-27 | Discharge: 2022-02-27 | Disposition: A | Discharge status: Home / Self Care | Payer: Medicaid Other | Attending: Obstetrics & Gynecology | Admitting: Obstetrics & Gynecology

## 2022-02-27 ENCOUNTER — Encounter (HOSPITAL_COMMUNITY): Payer: Self-pay | Admitting: Obstetrics & Gynecology

## 2022-02-27 DIAGNOSIS — O368120 Decreased fetal movements, second trimester, not applicable or unspecified: Secondary | ICD-10-CM | POA: Diagnosis not present

## 2022-02-27 DIAGNOSIS — O358XX0 Maternal care for other (suspected) fetal abnormality and damage, not applicable or unspecified: Secondary | ICD-10-CM

## 2022-02-27 DIAGNOSIS — E669 Obesity, unspecified: Secondary | ICD-10-CM

## 2022-02-27 DIAGNOSIS — B3731 Acute candidiasis of vulva and vagina: Secondary | ICD-10-CM | POA: Insufficient documentation

## 2022-02-27 DIAGNOSIS — O98812 Other maternal infectious and parasitic diseases complicating pregnancy, second trimester: Secondary | ICD-10-CM | POA: Insufficient documentation

## 2022-02-27 DIAGNOSIS — N939 Abnormal uterine and vaginal bleeding, unspecified: Secondary | ICD-10-CM

## 2022-02-27 DIAGNOSIS — O4442 Low lying placenta NOS or without hemorrhage, second trimester: Secondary | ICD-10-CM

## 2022-02-27 DIAGNOSIS — Z3A26 26 weeks gestation of pregnancy: Secondary | ICD-10-CM

## 2022-02-27 DIAGNOSIS — O4692 Antepartum hemorrhage, unspecified, second trimester: Secondary | ICD-10-CM

## 2022-02-27 DIAGNOSIS — O99342 Other mental disorders complicating pregnancy, second trimester: Secondary | ICD-10-CM | POA: Diagnosis not present

## 2022-02-27 DIAGNOSIS — O99212 Obesity complicating pregnancy, second trimester: Secondary | ICD-10-CM

## 2022-02-27 DIAGNOSIS — O23592 Infection of other part of genital tract in pregnancy, second trimester: Secondary | ICD-10-CM | POA: Diagnosis present

## 2022-02-27 LAB — URINALYSIS, ROUTINE W REFLEX MICROSCOPIC
Bilirubin Urine: NEGATIVE
Glucose, UA: NEGATIVE mg/dL
Hgb urine dipstick: NEGATIVE
Ketones, ur: NEGATIVE mg/dL
Leukocytes,Ua: NEGATIVE
Nitrite: NEGATIVE
Protein, ur: NEGATIVE mg/dL
Specific Gravity, Urine: 1.013 (ref 1.005–1.030)
pH: 6 (ref 5.0–8.0)

## 2022-02-27 LAB — WET PREP, GENITAL
Clue Cells Wet Prep HPF POC: NONE SEEN
Sperm: NONE SEEN
Trich, Wet Prep: NONE SEEN
WBC, Wet Prep HPF POC: <10 (ref <10)
Yeast Wet Prep HPF POC: NONE SEEN

## 2022-02-27 MED ORDER — TERCONAZOLE 0.4 % VA CREA: 1.0000 | TOPICAL_CREAM | Freq: Every day | VAGINAL | 0 refills | Status: DC

## 2022-02-27 NOTE — MAU Provider Note (Signed)
  History     CSN: [id number]  Arrival date and time: 02/27/22 2014   Event Date/Time   First Provider Initiated Contact with Patient 02/27/22 2146      Chief Complaint  Patient presents with  . Vaginal Bleeding    Red spotting after urination  . Decreased Fetal Movement   HPI Sharon Ward is a 26 y.o. G3P1011 at [redacted]w[redacted]d who presents to MAU for ***.   {GYN/OB [id number]}  Past Medical History:  Diagnosis Date  . Asthma   . Bipolar 1 disorder, mixed (HCC)   . Depression   . Generalized anxiety disorder   . Relationship dysfunction     Past Surgical History:  Procedure Laterality Date  . PILONIDAL CYST / SINUS EXCISION  09/11/2013  . PILONIDAL CYST EXCISION  05/17/2014   Pilonidal cystectomy with cleft lip    Family History  Problem Relation Age of Onset  . Asthma Mother   . Diabetes Mother   . Healthy Mother   . Hypertension Father   . Asthma Father   . Diabetes Father   . Healthy Father   . Asthma Brother   . Hypertension Paternal Uncle   . Diabetes Paternal Grandmother   . Hypertension Paternal Grandfather   . Diabetes Paternal Grandfather     Social History   Tobacco Use  . Smoking status: Former    Packs/day: 1.00    Years: 15.00    Total pack years: 15.00    Types: Cigarettes    Quit date: 07/14/2021    Years since quitting: 0.6  . Smokeless tobacco: Never  . Tobacco comments:    only smoke a "couple" of cigarettes when stressed or anxious, socially with friends per [facility] chart  Vaping Use  . Vaping Use: Never used  Substance Use Topics  . Alcohol use: Not Currently    Comment: occasional prior to pregnancy  . Drug use: Not Currently    Frequency: 4.0 times per week    Types: Marijuana    Comment: No Delta 9 since February 2023    Allergies:  Allergies  Allergen Reactions  . Ascorbate Rash  . Citrus Rash  . Coconut Flavor Rash  . Lamotrigine Rash  . Orange (Diagnostic) Rash  . Peach Flavor Rash  . Pear Rash  . Pineapple Rash     Medications Prior to Admission  Medication Sig Dispense Refill Last Dose  . cyclobenzaprine (FLEXERIL) 5 MG tablet Take 1 tablet (5 mg total) by mouth 2 (two) times daily as needed for muscle spasms. Take 1-2 tabs 20 tablet 0 Past Month  . FLUoxetine (PROZAC) 10 MG capsule Take 1 capsule (10 mg total) by mouth daily. 30 capsule 2 02/26/2022  . Prenatal Vit-Fe Fumarate-FA (PREPLUS) 27-1 MG TABS Take 1 tablet by mouth at bedtime.   02/26/2022  . QUETIAPINE FUMARATE PO Take 75 mg by mouth.   02/26/2022  . Blood Pressure Monitoring DEVI 1 each by Does not apply route once a week. 1 each 0     Review of Systems Physical Exam   Blood pressure (!) 123/58, pulse 87, temperature 98.2 F (36.8 C), temperature source Oral, resp. rate 18, height 5\' 3"  (1.6 m), weight 107 kg, last menstrual period 08/16/2021, SpO2 100 %.  Physical Exam  MAU Course  Procedures  MDM ***  Assessment and Plan  ***  10/14/2021 02/27/2022, 11:04 PM

## 2022-02-27 NOTE — MAU Note (Signed)
.  Sharon Ward is a 26 y.o. at [redacted]w[redacted]d here in MAU reporting: that she has not felt much FM over the past three days; she has only felt movement 3-4 times. She called the nurse line today and they told her to come in.  She has also noted a quarter-sized spotting when wiping last night and this afternoon. Does not need a pad for the spotting. Denies LOF.   Onset of complaint: Wednesday afternoon Pain score: 0/10 Vitals:   02/27/22 2025  BP: (!) 123/58  Pulse: 87  Resp: 18  Temp: 98.2 F (36.8 C)  SpO2: 100%     FHT:142 Lab orders placed from triage:  UA

## 2022-02-27 NOTE — MAU Note (Signed)
No bleeding noted with speculum exam per provider. Pt denies any additional vaginal bleeding since arrival.Discharged to home via taxi voucher.

## 2022-03-01 ENCOUNTER — Encounter: Payer: Self-pay | Admitting: Obstetrics and Gynecology

## 2022-03-01 LAB — GC/CHLAMYDIA PROBE AMP (~~LOC~~) NOT AT ARMC
Comment: NEGATIVE
Comment: NORMAL

## 2022-03-03 ENCOUNTER — Encounter: Payer: Self-pay | Admitting: Family Medicine

## 2022-03-04 ENCOUNTER — Encounter: Payer: Self-pay | Admitting: Obstetrics and Gynecology

## 2022-03-04 ENCOUNTER — Other Ambulatory Visit (HOSPITAL_COMMUNITY)
Admission: RE | Admit: 2022-03-04 | Discharge: 2022-03-04 | Disposition: A | Discharge status: Home / Self Care | Payer: Medicaid Other | Source: Ambulatory Visit | Attending: Family Medicine | Admitting: Family Medicine

## 2022-03-04 ENCOUNTER — Ambulatory Visit (INDEPENDENT_AMBULATORY_CARE_PROVIDER_SITE_OTHER): Payer: Medicaid Other | Admitting: Advanced Practice Midwife

## 2022-03-04 VITALS — BP 109/67 | HR 87 | Wt 235.3 lb

## 2022-03-04 DIAGNOSIS — N898 Other specified noninflammatory disorders of vagina: Secondary | ICD-10-CM

## 2022-03-04 DIAGNOSIS — O26899 Other specified pregnancy related conditions, unspecified trimester: Secondary | ICD-10-CM | POA: Insufficient documentation

## 2022-03-04 DIAGNOSIS — O4440 Low lying placenta NOS or without hemorrhage, unspecified trimester: Secondary | ICD-10-CM

## 2022-03-04 DIAGNOSIS — Z3480 Encounter for supervision of other normal pregnancy, unspecified trimester: Secondary | ICD-10-CM

## 2022-03-04 DIAGNOSIS — R8271 Bacteriuria: Secondary | ICD-10-CM

## 2022-03-04 DIAGNOSIS — Z3A27 27 weeks gestation of pregnancy: Secondary | ICD-10-CM

## 2022-03-04 NOTE — Progress Notes (Signed)
Concerned if she is loosing mucous plug.States is having discharge since using cream for yeast - states it looks like mucous plug that she had with her other child. States is thick, yellowish, white. States  is having braxton hicks contractions also this morning.

## 2022-03-04 NOTE — Patient Instructions (Addendum)
TDaP Vaccine Pregnancy Get the Whooping Cough Vaccine While You Are Pregnant (CDC)  It is important for women to get the whooping cough vaccine in the third trimester of each pregnancy. Vaccines are the best way to prevent this disease. There are 2 different whooping cough vaccines. Both vaccines combine protection against whooping cough, tetanus and diphtheria, but they are for different age groups: Tdap: for everyone 11 years or older, including pregnant women  DTaP: for children 2 months through 6 years of age  You need the whooping cough vaccine during each of your pregnancies The recommended time to get the shot is during your 27th through 36th week of pregnancy, preferably during the earlier part of this time period. The Centers for Disease Control and Prevention (CDC) recommends that pregnant women receive the whooping cough vaccine for adolescents and adults (called Tdap vaccine) during the third trimester of each pregnancy. The recommended time to get the shot is during your 27th through 36th week of pregnancy, preferably during the earlier part of this time period. This replaces the original recommendation that pregnant women get the vaccine only if they had not previously received it. The American College of Obstetricians and Gynecologists and the American College of Nurse-Midwives support this recommendation.  You should get the whooping cough vaccine while pregnant to pass protection to your baby frame support disabled and/or not supported in this browser  Learn why Laura decided to get the whooping cough vaccine in her 3rd trimester of pregnancy and how her baby girl was born with some protection against the disease. Also available on YouTube. After receiving the whooping cough vaccine, your body will create protective antibodies (proteins produced by the body to fight off diseases) and pass some of them to your baby before birth. These antibodies provide your baby some short-term  protection against whooping cough in early life. These antibodies can also protect your baby from some of the more serious complications that come along with whooping cough. Your protective antibodies are at their highest about 2 weeks after getting the vaccine, but it takes time to pass them to your baby. So the preferred time to get the whooping cough vaccine is early in your third trimester. The amount of whooping cough antibodies in your body decreases over time. That is why CDC recommends you get a whooping cough vaccine during each pregnancy. Doing so allows each of your babies to get the greatest number of protective antibodies from you. This means each of your babies will get the best protection possible against this disease.  Getting the whooping cough vaccine while pregnant is better than getting the vaccine after you give birth Whooping cough vaccination during pregnancy is ideal so your baby will have short-term protection as soon as he is born. This early protection is important because your baby will not start getting his whooping cough vaccines until he is 2 months old. These first few months of life are when your baby is at greatest risk for catching whooping cough. This is also when he's at greatest risk for having severe, potentially life-threating complications from the infection. To avoid that gap in protection, it is best to get a whooping cough vaccine during pregnancy. You will then pass protection to your baby before he is born. To continue protecting your baby, he should get whooping cough vaccines starting at 2 months old. You may never have gotten the Tdap vaccine before and did not get it during this pregnancy. If so, you should make sure   to get the vaccine immediately after you give birth, before leaving the hospital or birthing center. It will take about 2 weeks before your body develops protection (antibodies) in response to the vaccine. Once you have protection from the vaccine,  you are less likely to give whooping cough to your newborn while caring for him. But remember, your baby will still be at risk for catching whooping cough from others. A recent study looked to see how effective Tdap was at preventing whooping cough in babies whose mothers got the vaccine while pregnant or in the hospital after giving birth. The study found that getting Tdap between 27 through 36 weeks of pregnancy is 85% more effective at preventing whooping cough in babies younger than 2 months old. Blood tests cannot tell if you need a whooping cough vaccine There are no blood tests that can tell you if you have enough antibodies in your body to protect yourself or your baby against whooping cough. Even if you have been sick with whooping cough in the past or previously received the vaccine, you still should get the vaccine during each pregnancy. Breastfeeding may pass some protective antibodies onto your baby By breastfeeding, you may pass some antibodies you have made in response to the vaccine to your baby. When you get a whooping cough vaccine during your pregnancy, you will have antibodies in your breast milk that you can share with your baby as soon as your milk comes in. However, your baby will not get protective antibodies immediately if you wait to get the whooping cough vaccine until after delivering your baby. This is because it takes about 2 weeks for your body to create antibodies. Learn more about the health benefits of breastfeeding.    Considering Waterbirth? Guide for patients at [facility]) Why consider waterbirth? Gentle birth for babies  Less pain medicine used in labor  May allow for passive descent/less pushing  May reduce perineal tears  More mobility and instinctive maternal position changes  Increased maternal relaxation   Is waterbirth safe? What are the risks of infection, drowning or other complications? Infection:  Very low risk (3.7 % for  tub vs 4.8% for bed)  7 in 8000 waterbirths with documented infection  Poorly cleaned equipment most common cause  Slightly lower group B strep transmission rate  Drowning  Maternal:  Very low risk  Related to seizures or fainting  Newborn:  Very low risk. No evidence of increased risk of respiratory problems in multiple large studies  Physiological protection from breathing under water  Avoid underwater birth if there are any fetal complications  Once baby's head is out of the water, keep it out.  Birth complication  Some reports of cord trauma, but risk decreased by bringing baby to surface gradually  No evidence of increased risk of shoulder dystocia. Mothers can usually change positions faster in water than in a bed, possibly aiding the maneuvers to free the shoulder.   There are 2 things you MUST do to have a waterbirth with [facility]: Attend a waterbirth class at [facility]   3rd Wednesday of every month from 7-9 pm (virtual during COVID) Free Register online at [email or web address] or [email or web address] or by calling [phone] Bring us the certificate from the class to your prenatal appointment or send via MyChart Meet with a midwife at 36 weeks* to see if you can still plan a waterbirth and to sign the consent.   *We also recommend   that you schedule as many of your prenatal visits with a midwife as possible.    Helpful information: You may want to bring a bathing suit top to the hospital to wear during labor but this is optional.  All other supplies are provided by the hospital. Please arrive at the hospital with signs of active labor, and do not wait at home until late in labor. It takes 45 min- 1 hour for fetal monitoring, and check in to your room to take place, plus transport and filling of the waterbirth tub.    Things that would prevent you from having a waterbirth: Premature, <37wks  Previous cesarean birth  Presence of thick  meconium-stained fluid  Multiple gestation (Twins, triplets, etc.)  Uncontrolled diabetes or gestational diabetes requiring medication  Hypertension diagnosed in pregnancy or preexisting hypertension (gestational hypertension, preeclampsia, or chronic hypertension) Fetal growth restriction (your baby measures less than 10th percentile on ultrasound) Heavy vaginal bleeding  Non-reassuring fetal heart rate  Active infection (MRSA, etc.). Group B Strep is NOT a contraindication for waterbirth.  If your labor has to be induced and induction method requires continuous monitoring of the baby's heart rate  Other risks/issues identified by your obstetrical provider   Please remember that birth is unpredictable. Under certain unforeseeable circumstances your provider may advise against giving birth in the tub. These decisions will be made on a case-by-case basis and with the safety of you and your baby as our highest priority.    Updated 11/11/21  

## 2022-03-04 NOTE — Progress Notes (Signed)
PRENATAL VISIT NOTE  Subjective:  Sharon Ward is a 26 y.o. G3P1011 at [redacted]w[redacted]d being seen today for ongoing prenatal care and concern for possibly loosing her mucus plug. She was seen in MAU 7/22.Clinical Dx VVC. Rx Terazol. Started taking yesterday and then noticed mucoid discharge. Many questions and concerns about results from that visit.   On review of chart, US showed resolution of low-lying placenta, Cervical length of 3.8. Wet Prep was neg but provider documented clinical Dx based of discharge. Explained provider's rationale to pt. Gc/Chlamydia specimen inadequate and could not be run. Pt reports that she thinks her partner is cheating on her and she has had recent IC. Wants to be retested.   She is currently monitored for the following issues for this low-risk pregnancy and has Anxiety; LGSIL on Pap smear of cervix; Oppositional defiant disorder; Pilonidal disease; Suicide attempt (HCC); Prolonged QT interval; Intentional drug overdose (HCC); Bipolar 2 disorder, major depressive episode (HCC); Marijuana abuse; Borderline personality disorder (HCC); PTSD (post-traumatic stress disorder); Bipolar I disorder, most recent episode mixed (HCC); GAD (generalized anxiety disorder); Supervision of other normal pregnancy, antepartum; GBS bacteriuria; Bipolar disorder, rapid cycling (HCC); Chronic constipation; Gastroesophageal reflux disease without esophagitis; History of dysphagia; Mild intermittent asthma without complication; Yeast vaginitis; Echogenic intracardiac focus of fetus on prenatal ultrasound; and Low-lying placenta on their problem list.  Patient reports occasional contractions and passing cervical mucus .  Contractions: Irregular. Vag. Bleeding: None.  Movement: Present. Denies leaking of fluid.   The following portions of the patient's history were reviewed and updated as appropriate: allergies, current medications, past family history, past medical history, past social history, past  surgical history and problem list.   Objective:   Vitals:   03/04/22 1127  BP: 109/67  Pulse: 87  Weight: 235 lb 4.8 oz (106.7 kg)    Fetal Status: Fetal Heart Rate (bpm): 145   Movement: Present     General:  Alert, oriented and cooperative. Patient is in no acute distress.  Skin: Skin is warm and dry. No rash noted.   Cardiovascular: Normal heart rate noted  Respiratory: Normal respiratory effort, no problems with respiration noted  Abdomen: Soft, gravid, appropriate for gestational age.  Pain/Pressure: Present     Pelvic: Speculum exam performed in the presence of a chaperone Dilation: Closed Effacement (%): 0 Station: Ballotable. Large amount of thick, curdlike, odorless discharge. No mucus, blood or pooling of fluid. Cervix visually closed.  Extremities: Normal range of motion.  Edema: Mild pitting, slight indentation  Mental Status: Normal mood and affect. Normal behavior. Normal judgment and thought content.   Assessment and Plan:  Pregnancy: G3P1011 at [redacted]w[redacted]d 1. Vaginal discharge during pregnancy, antepartum - Clinically C/W Yeast. Continue Cream - Cervicovaginal ancillary only( [place])  2. Vaginal irritation  - Cervicovaginal ancillary only( [place])  3. Low-lying placenta-->RESOLVED. Explained to pt that it would not revert back. May resume IC (recommended condoms) and it is OK to have cervical exams. Plan for vaginal delivery.   4. Supervision of other normal pregnancy, antepartum - 28 week labs and TDaP at [place]. TDaP info given. Pt requests vaccine.   5. GBS bacteriuria - ABX in labor  6. [redacted] weeks gestation of pregnancy  - Pt is interested in waterbirth.  No contraindications at this time per chart review/patient assessment.   - Pt to enroll in class, see CNMs for most visits in the office.  - Discussed waterbirth as option for low-risk pregnancy.  Reviewed conditions that may  arise during pregnancy that will risk pt out of waterbirth including  hypertension, diabetes, fetal growth restriction <10%ile, etc.    Preterm labor symptoms and general obstetric precautions including but not limited to vaginal bleeding, contractions, leaking of fluid and fetal movement were reviewed in detail with the patient. Please refer to After Visit Summary for other counseling recommendations.   Return in about 1 week (around 03/11/2022) for ROB/GTT in 1-2 weeks.  Future Appointments  Date Time Provider Department Center  03/18/2022  3:15 PM [facility] NURSE [facility]  03/18/2022  3:30 PM [facility] US3 [facility]    Dorathy Kinsman, CNM

## 2022-03-05 ENCOUNTER — Encounter: Payer: Medicaid Other | Admitting: Obstetrics and Gynecology

## 2022-03-05 LAB — CERVICOVAGINAL ANCILLARY ONLY
Bacterial Vaginitis (gardnerella): NEGATIVE
Candida Glabrata: NEGATIVE
Candida Vaginitis: NEGATIVE
Chlamydia: NEGATIVE
Comment: NEGATIVE
Comment: NEGATIVE
Comment: NEGATIVE
Comment: NEGATIVE
Comment: NEGATIVE
Comment: NORMAL
Neisseria Gonorrhea: NEGATIVE
Trichomonas: NEGATIVE

## 2022-03-14 ENCOUNTER — Inpatient Hospital Stay (HOSPITAL_COMMUNITY)
Admission: AD | Admit: 2022-03-14 | Discharge: 2022-03-14 | Disposition: A | Discharge status: Home / Self Care | Payer: Medicaid Other | Attending: Obstetrics and Gynecology | Admitting: Obstetrics and Gynecology

## 2022-03-14 ENCOUNTER — Other Ambulatory Visit: Payer: Self-pay

## 2022-03-14 ENCOUNTER — Encounter (HOSPITAL_COMMUNITY): Payer: Self-pay | Admitting: Obstetrics and Gynecology

## 2022-03-14 DIAGNOSIS — O26893 Other specified pregnancy related conditions, third trimester: Secondary | ICD-10-CM | POA: Diagnosis not present

## 2022-03-14 DIAGNOSIS — O98813 Other maternal infectious and parasitic diseases complicating pregnancy, third trimester: Secondary | ICD-10-CM | POA: Insufficient documentation

## 2022-03-14 DIAGNOSIS — B3731 Acute candidiasis of vulva and vagina: Secondary | ICD-10-CM | POA: Diagnosis not present

## 2022-03-14 DIAGNOSIS — O23593 Infection of other part of genital tract in pregnancy, third trimester: Secondary | ICD-10-CM | POA: Insufficient documentation

## 2022-03-14 DIAGNOSIS — Z3A28 28 weeks gestation of pregnancy: Secondary | ICD-10-CM | POA: Diagnosis not present

## 2022-03-14 DIAGNOSIS — Z3689 Encounter for other specified antenatal screening: Secondary | ICD-10-CM

## 2022-03-14 LAB — URINALYSIS, ROUTINE W REFLEX MICROSCOPIC
Bilirubin Urine: NEGATIVE
Glucose, UA: NEGATIVE mg/dL
Hgb urine dipstick: NEGATIVE
Ketones, ur: NEGATIVE mg/dL
Nitrite: NEGATIVE
Protein, ur: NEGATIVE mg/dL
Specific Gravity, Urine: 1.009 (ref 1.005–1.030)
pH: 6 (ref 5.0–8.0)

## 2022-03-14 LAB — WET PREP, GENITAL
Clue Cells Wet Prep HPF POC: NONE SEEN
Sperm: NONE SEEN
Trich, Wet Prep: NONE SEEN
WBC, Wet Prep HPF POC: <10 (ref <10)

## 2022-03-14 MED ORDER — MICONAZOLE NITRATE 2 % VA CREA: 1.0000 | TOPICAL_CREAM | Freq: Every day | VAGINAL | 2 refills | Status: DC

## 2022-03-14 NOTE — MAU Note (Signed)
Pt reports to mau with c/o lower abd pain that radiates to her back.  Pt reports pain comes and goes and worsens with movement and position changes.  Denies LOF or vag bleeding +FM

## 2022-03-14 NOTE — MAU Provider Note (Signed)
Chief Complaint:  Back Pain and Abdominal Pain   Event Date/Time   First Provider Initiated Contact with Patient 03/14/22 1909     HPI: Sharon Ward is a 26 y.o. G3P1011 at [redacted]w[redacted]d who presents to maternity admissions reporting lower abdominal pain that radiates into her lower back. Comes and goes, worst with movement and position changes especially in the bed. Denies vaginal bleeding, leaking of fluid, decreased fetal movement, fever, falls, or recent illness. No other physical complaints.  Pregnancy Course: Receives OB care at [facility], prenatal records reviewed  Past Medical History:  Diagnosis Date   Asthma    Bipolar 1 disorder, mixed (HCC)    Depression    Generalized anxiety disorder    Relationship dysfunction    OB History  Gravida Para Term Preterm AB Living  3 1 1   1 1   SAB IAB Ectopic Multiple Live Births  1     0 1    # Outcome Date GA Lbr Len/2nd Weight Sex Delivery Anes PTL Lv  3 Current           2 SAB 07/02/20          1 Term 01/14/20 [redacted]w[redacted]d 01:21 / 00:59 7 lb 6.2 oz (3.351 kg) F Vag-Spont None  LIV   Past Surgical History:  Procedure Laterality Date   PILONIDAL CYST / SINUS EXCISION  09/11/2013   PILONIDAL CYST EXCISION  05/17/2014   Pilonidal cystectomy with cleft lip   Family History  Problem Relation Age of Onset   Asthma Mother    Diabetes Mother    Healthy Mother    Hypertension Father    Asthma Father    Diabetes Father    Healthy Father    Asthma Brother    Hypertension Paternal Uncle    Diabetes Paternal Grandmother    Hypertension Paternal Grandfather    Diabetes Paternal Grandfather    Social History   Tobacco Use   Smoking status: Former    Packs/day: 1.00    Years: 15.00    Total pack years: 15.00    Types: Cigarettes    Quit date: 07/14/2021    Years since quitting: 0.6   Smokeless tobacco: Never   Tobacco comments:    only smoke a "couple" of cigarettes when stressed or anxious, socially with friends per [facility] chart  Vaping Use    Vaping Use: Never used  Substance Use Topics   Alcohol use: Not Currently    Comment: occasional prior to pregnancy   Drug use: Not Currently    Frequency: 4.0 times per week    Types: Marijuana    Comment: No Delta 9 since February 2023   Allergies  Allergen Reactions   Ascorbate Rash   Citrus Rash   Coconut Flavor Rash   Lamotrigine Rash   Orange (Diagnostic) Rash   Peach Flavor Rash   Pear Rash   Pineapple Rash   Medications Prior to Admission  Medication Sig Dispense Refill Last Dose   Blood Pressure Monitoring DEVI 1 each by Does not apply route once a week. 1 each 0    cyclobenzaprine (FLEXERIL) 5 MG tablet Take 1 tablet (5 mg total) by mouth 2 (two) times daily as needed for muscle spasms. Take 1-2 tabs (Patient not taking: Reported on 03/04/2022) 20 tablet 0    FLUoxetine (PROZAC) 10 MG capsule Take 1 capsule (10 mg total) by mouth daily. 30 capsule 2    Prenatal Vit-Fe Fumarate-FA (PREPLUS) 27-1 MG TABS Take 1  tablet by mouth at bedtime.      QUETIAPINE FUMARATE PO Take 75 mg by mouth.      terconazole (TERAZOL 7) 0.4 % vaginal cream Place 1 applicator vaginally at bedtime. Use for seven days 45 g 0    I have reviewed patient's Past Medical Hx, Surgical Hx, Family Hx, Social Hx, medications and allergies.   ROS:  Pertinent items noted in HPI and remainder of comprehensive ROS otherwise negative.   Physical Exam  Patient Vitals for the past 24 hrs:  BP Temp Temp src Pulse Resp SpO2 Weight  03/14/22 1829 123/71 97.9 F (36.6 C) Oral 82 17 100 % 240 lb 11.2 oz (109.2 kg)   Constitutional: Well-developed, well-nourished female in no acute distress.  Cardiovascular: normal rate & rhythm, warm and well-perfused Respiratory: normal effort, no problems with respiration noted GI: Abd soft, non-tender, gravid appropriate for gestational age. Pos BS x 4 MS: Extremities nontender, no edema, normal ROM Neurologic: Alert and oriented x 4.  GU: no CVA tenderness Pelvic: RN  collected swabs  Fetal Tracing: reactive Baseline: 140 Variability: moderate Accelerations: 15x15 Decelerations: none Toco: relaxed to UI   Labs: Results for orders placed or performed during the hospital encounter of 03/14/22 (from the past 24 hour(s))  Urinalysis, Routine w reflex microscopic Urine, Clean Catch     Status: Abnormal   Collection Time: 03/14/22  6:41 PM  Result Value Ref Range   Color, Urine YELLOW YELLOW   APPearance CLOUDY (A) CLEAR   Specific Gravity, Urine 1.009 1.005 - 1.030   pH 6.0 5.0 - 8.0   Glucose, UA NEGATIVE NEGATIVE mg/dL   Hgb urine dipstick NEGATIVE NEGATIVE   Bilirubin Urine NEGATIVE NEGATIVE   Ketones, ur NEGATIVE NEGATIVE mg/dL   Protein, ur NEGATIVE NEGATIVE mg/dL   Nitrite NEGATIVE NEGATIVE   Leukocytes,Ua MODERATE (A) NEGATIVE   RBC / HPF 0-5 0 - 5 RBC/hpf   WBC, UA 21-50 0 - 5 WBC/hpf   Bacteria, UA MANY (A) NONE SEEN   Squamous Epithelial / LPF 21-50 0 - 5   WBC Clumps PRESENT    Hyaline Casts, UA PRESENT   Wet prep, genital     Status: Abnormal   Collection Time: 03/14/22  7:13 PM   Specimen: Vaginal  Result Value Ref Range   Yeast Wet Prep HPF POC PRESENT (A) NONE SEEN   Trich, Wet Prep NONE SEEN NONE SEEN   Clue Cells Wet Prep HPF POC NONE SEEN NONE SEEN   WBC, Wet Prep HPF POC <10 <10   Sperm NONE SEEN    Imaging:  No results found.  MAU Course: Orders Placed This Encounter  Procedures   Wet prep, genital   Culture, OB Urine   Urinalysis, Routine w reflex microscopic Urine, Clean Catch   Discharge patient   Meds ordered this encounter  Medications   miconazole (MONISTAT 7) 2 % vaginal cream    Sig: Place 1 Applicatorful vaginally at bedtime. Apply for seven nights    Dispense:  30 g    Refill:  2    Order Specific Question:   Supervising Provider    Answer:   Samara Snide   MDM: Very little current cramping, NST reactive. Pt describes cramping at night and difficulty turning over in the bed. Discussed  round ligament pain, RN collected vaginal swabs.  Wet prep positive for yeast, results given to patient and treatment sent to pharmacy.  Assessment: 1. Yeast vaginitis   2. NST (  non-stress test) reactive   3. [redacted] weeks gestation of pregnancy    Plan: Discharge home in stable condition with return precautions.     Follow-up Information     [facility] Follow up.   Specialty: Obstetrics and Gynecology Why: as scheduled for ongoing prenatal care Contact information: [address] [place] [address] [phone]                Allergies as of 03/14/2022       Reactions   Ascorbate Rash   Citrus Rash   Coconut Flavor Rash   Lamotrigine Rash   Orange (diagnostic) Rash   Peach Flavor Rash   Pear Rash   Pineapple Rash        Medication List     TAKE these medications    Blood Pressure Monitoring Devi 1 each by Does not apply route once a week.   cyclobenzaprine 5 MG tablet Commonly known as: FLEXERIL Take 1 tablet (5 mg total) by mouth 2 (two) times daily as needed for muscle spasms. Take 1-2 tabs   FLUoxetine 10 MG capsule Commonly known as: PROZAC Take 1 capsule (10 mg total) by mouth daily.   miconazole 2 % vaginal cream Commonly known as: MONISTAT 7 Place 1 Applicatorful vaginally at bedtime. Apply for seven nights   PrePLUS 27-1 MG Tabs Take 1 tablet by mouth at bedtime.   QUETIAPINE FUMARATE PO Take 75 mg by mouth.   terconazole 0.4 % vaginal cream Commonly known as: TERAZOL 7 Place 1 applicator vaginally at bedtime. Use for seven days        Edd Arbour, CNM, MSN, [employer] Certified Nurse Midwife, [facility]

## 2022-03-15 ENCOUNTER — Encounter: Payer: Self-pay | Admitting: Family Medicine

## 2022-03-15 LAB — CULTURE, OB URINE

## 2022-03-18 ENCOUNTER — Ambulatory Visit (INDEPENDENT_AMBULATORY_CARE_PROVIDER_SITE_OTHER): Payer: Medicaid Other | Admitting: Student

## 2022-03-18 ENCOUNTER — Ambulatory Visit: Payer: Medicaid Other | Admitting: *Deleted

## 2022-03-18 ENCOUNTER — Other Ambulatory Visit: Payer: Self-pay | Admitting: *Deleted

## 2022-03-18 ENCOUNTER — Encounter: Payer: Medicaid Other | Admitting: Family Medicine

## 2022-03-18 ENCOUNTER — Ambulatory Visit: Payer: Medicaid Other | Attending: Obstetrics and Gynecology

## 2022-03-18 VITALS — BP 121/55 | HR 78 | Wt 238.7 lb

## 2022-03-18 VITALS — BP 102/49 | HR 76

## 2022-03-18 DIAGNOSIS — E669 Obesity, unspecified: Secondary | ICD-10-CM

## 2022-03-18 DIAGNOSIS — O99323 Drug use complicating pregnancy, third trimester: Secondary | ICD-10-CM

## 2022-03-18 DIAGNOSIS — Z3A29 29 weeks gestation of pregnancy: Secondary | ICD-10-CM | POA: Diagnosis not present

## 2022-03-18 DIAGNOSIS — Z23 Encounter for immunization: Secondary | ICD-10-CM

## 2022-03-18 DIAGNOSIS — O99212 Obesity complicating pregnancy, second trimester: Secondary | ICD-10-CM | POA: Diagnosis present

## 2022-03-18 DIAGNOSIS — O283 Abnormal ultrasonic finding on antenatal screening of mother: Secondary | ICD-10-CM

## 2022-03-18 DIAGNOSIS — O35BXX0 Maternal care for other (suspected) fetal abnormality and damage, fetal cardiac anomalies, not applicable or unspecified: Secondary | ICD-10-CM

## 2022-03-18 DIAGNOSIS — O99343 Other mental disorders complicating pregnancy, third trimester: Secondary | ICD-10-CM

## 2022-03-18 DIAGNOSIS — O4443 Low lying placenta NOS or without hemorrhage, third trimester: Secondary | ICD-10-CM

## 2022-03-18 DIAGNOSIS — Z6838 Body mass index (BMI) 38.0-38.9, adult: Secondary | ICD-10-CM | POA: Insufficient documentation

## 2022-03-18 DIAGNOSIS — F1290 Cannabis use, unspecified, uncomplicated: Secondary | ICD-10-CM

## 2022-03-18 DIAGNOSIS — Z3480 Encounter for supervision of other normal pregnancy, unspecified trimester: Secondary | ICD-10-CM

## 2022-03-18 DIAGNOSIS — F319 Bipolar disorder, unspecified: Secondary | ICD-10-CM

## 2022-03-18 DIAGNOSIS — O4440 Low lying placenta NOS or without hemorrhage, unspecified trimester: Secondary | ICD-10-CM

## 2022-03-18 DIAGNOSIS — F411 Generalized anxiety disorder: Secondary | ICD-10-CM

## 2022-03-18 DIAGNOSIS — Z3483 Encounter for supervision of other normal pregnancy, third trimester: Secondary | ICD-10-CM

## 2022-03-18 DIAGNOSIS — O99213 Obesity complicating pregnancy, third trimester: Secondary | ICD-10-CM

## 2022-03-18 NOTE — Progress Notes (Signed)
PRENATAL VISIT NOTE  Subjective:  Sharon Ward is a 26 y.o. G3P1011 at [redacted]w[redacted]d being seen today for ongoing prenatal care.  She is currently monitored for the following issues for this low-risk pregnancy and has Anxiety; LGSIL on Pap smear of cervix; Oppositional defiant disorder; Pilonidal disease; Suicide attempt (HCC); Prolonged QT interval; Intentional drug overdose (HCC); Bipolar 2 disorder, major depressive episode (HCC); Marijuana abuse; Borderline personality disorder (HCC); PTSD (post-traumatic stress disorder); Bipolar I disorder, most recent episode mixed (HCC); GAD (generalized anxiety disorder); Supervision of other normal pregnancy, antepartum; GBS bacteriuria; Bipolar disorder, rapid cycling (HCC); Chronic constipation; Gastroesophageal reflux disease without esophagitis; History of dysphagia; Mild intermittent asthma without complication; Yeast vaginitis; Echogenic intracardiac focus of fetus on prenatal ultrasound; and Low-lying placenta on their problem list.  Patient reports no complaints.  Contractions: Irregular. Vag. Bleeding: None.  Movement: Present. Denies leaking of fluid.   The following portions of the patient's history were reviewed and updated as appropriate: allergies, current medications, past family history, past medical history, past social history, past surgical history and problem list.   Objective:   Vitals:   03/18/22 1351  BP: (!) 121/55  Pulse: 78  Weight: 238 lb 11.2 oz (108.3 kg)    Fetal Status: Fetal Heart Rate (bpm): 148 Fundal Height: 33 cm Movement: Present     General:  Alert, oriented and cooperative. Patient is in no acute distress.  Skin: Skin is warm and dry. No rash noted.   Cardiovascular: Normal heart rate noted  Respiratory: Normal respiratory effort, no problems with respiration noted  Abdomen: Soft, gravid, appropriate for gestational age.  Pain/Pressure: Present     Pelvic: Cervical exam deferred        Extremities: Normal range  of motion.  Edema: Mild pitting, slight indentation  Mental Status: Normal mood and affect. Normal behavior. Normal judgment and thought content.   Assessment and Plan:  Pregnancy: G3P1011 at [redacted]w[redacted]d 1. Supervision of other normal pregnancy, antepartum -anticipatory guidance given for 28 week labs (scheduled for next week) -she reports that she is doing well! She has a comprehensive care team that includes SW and psychiatrist to manage her meds -Tdap vaccine greater than or equal to 7yo IM -- Pt interested in waterbirth and has attended the class.  - Reviewed conditions in labor that will risk her out of water immersion including thick meconium or blood stained amniotic fluid, non-reassuring fetal status on monitor, excessive bleeding, hypertension, dizziness, use of IV meds, damaged equipment or staffing that does not allow for water immersion, etc.  - The attending midwife must be on the unit for water immersion to begin; pt understands this may delay the start of water immersion. - Reminded pt that signing consent in labor at the hospital also acknowledges they will exit the tub if the attending midwife requests. - Consent given to patient for review.  Consent will be reviewed and signed at the hospital by the waterbirth provider prior to use of the tub. - Discussed other labor support options if waterbirth becomes unavailable, including position change, freedom of movement, use of birthing ball, and/or use of hydrotherapy in the shower (dependent upon medical condition/provider discretion).   Preterm labor symptoms and general obstetric precautions including but not limited to vaginal bleeding, contractions, leaking of fluid and fetal movement were reviewed in detail with the patient. Please refer to After Visit Summary for other counseling recommendations.   Return in about 2 weeks (around 04/01/2022), or 2 wks with JW or KK.  Future Appointments  Date Time Provider Department Center   03/18/2022  3:15 PM Ala Bent, RN [facility]  03/18/2022  3:30 PM [facility] US3 [facility]  03/25/2022  8:50 AM [facility] LAB [facility]  04/08/2022  1:15 PM Marylene Land, CNM [facility]    Marylene Land, CNM

## 2022-03-18 NOTE — Patient Instructions (Signed)
  Considering Waterbirth? Guide for patients at [facility] Why consider waterbirth? Gentle birth for babies  Less pain medicine used in labor  May allow for passive descent/less pushing  May reduce perineal tears  More mobility and instinctive maternal position changes  Increased maternal relaxation   Is waterbirth safe? What are the risks of infection, drowning or other complications? Infection:  Very low risk (3.7 % for tub vs 4.8% for bed)  7 in 8000 waterbirths with documented infection  Poorly cleaned equipment most common cause  Slightly lower group B strep transmission rate  Drowning  Maternal:  Very low risk  Related to seizures or fainting  Newborn:  Very low risk. No evidence of increased risk of respiratory problems in multiple large studies  Physiological protection from breathing under water  Avoid underwater birth if there are any fetal complications  Once baby's head is out of the water, keep it out.  Birth complication  Some reports of cord trauma, but risk decreased by bringing baby to surface gradually  No evidence of increased risk of shoulder dystocia. Mothers can usually change positions faster in water than in a bed, possibly aiding the maneuvers to free the shoulder.   There are 2 things you MUST do to have a waterbirth with [facility]: Attend a waterbirth class at [facility] [place]   3rd Wednesday of every month from 7-9 pm (virtual during COVID) Free Register online at [email or web address] or [email or web address] or by calling [phone] Bring us the certificate from the class to your prenatal appointment or send via MyChart Meet with a midwife at 36 weeks* to see if you can still plan a waterbirth and to sign the consent.   *We also recommend that you schedule as many of your prenatal visits with a midwife as possible.    Helpful information: You may want to bring a bathing suit top to the hospital  to wear during labor but this is optional.  All other supplies are provided by the hospital. Please arrive at the hospital with signs of active labor, and do not wait at home until late in labor. It takes 45 min- 1 hour for fetal monitoring, and check in to your room to take place, plus transport and filling of the waterbirth tub.    Things that would prevent you from having a waterbirth: Premature, <37wks  Previous cesarean birth  Presence of thick meconium-stained fluid  Multiple gestation (Twins, triplets, etc.)  Uncontrolled diabetes or gestational diabetes requiring medication  Hypertension diagnosed in pregnancy or preexisting hypertension (gestational hypertension, preeclampsia, or chronic hypertension) Fetal growth restriction (your baby measures less than 10th percentile on ultrasound) Heavy vaginal bleeding  Non-reassuring fetal heart rate  Active infection (MRSA, etc.). Group B Strep is NOT a contraindication for waterbirth.  If your labor has to be induced and induction method requires continuous monitoring of the baby's heart rate  Other risks/issues identified by your obstetrical provider   Please remember that birth is unpredictable. Under certain unforeseeable circumstances your provider may advise against giving birth in the tub. These decisions will be made on a case-by-case basis and with the safety of you and your baby as our highest priority.    Updated 11/11/21  

## 2022-03-24 ENCOUNTER — Other Ambulatory Visit: Payer: Self-pay

## 2022-03-24 DIAGNOSIS — Z3480 Encounter for supervision of other normal pregnancy, unspecified trimester: Secondary | ICD-10-CM

## 2022-03-25 ENCOUNTER — Other Ambulatory Visit: Payer: Medicaid Other

## 2022-03-25 DIAGNOSIS — Z3480 Encounter for supervision of other normal pregnancy, unspecified trimester: Secondary | ICD-10-CM

## 2022-03-26 LAB — CBC
Hematocrit: 36.9 % (ref 34.0–46.6)
Hemoglobin: 12.8 g/dL (ref 11.1–15.9)
MCH: 31.8 pg (ref 26.6–33.0)
MCHC: 34.7 g/dL (ref 31.5–35.7)
MCV: 92 fL (ref 79–97)
Platelets: 165 10*3/uL (ref 150–450)
RBC: 4.03 x10E6/uL (ref 3.77–5.28)
RDW: 12.8 % (ref 11.7–15.4)
WBC: 7.6 10*3/uL (ref 3.4–10.8)

## 2022-03-26 LAB — RPR: RPR Ser Ql: NONREACTIVE

## 2022-03-26 LAB — GLUCOSE TOLERANCE, 2 HOURS W/ 1HR
Glucose, 1 hour: 151 mg/dL (ref 70–179)
Glucose, 2 hour: 92 mg/dL (ref 70–152)
Glucose, Fasting: 91 mg/dL (ref 70–91)

## 2022-03-26 LAB — HIV ANTIBODY (ROUTINE TESTING W REFLEX): HIV Screen 4th Generation wRfx: NONREACTIVE

## 2022-04-02 ENCOUNTER — Inpatient Hospital Stay (HOSPITAL_COMMUNITY)
Admission: AD | Admit: 2022-04-02 | Discharge: 2022-04-02 | Disposition: A | Discharge status: Home / Self Care | Payer: Medicaid Other | Attending: Family Medicine | Admitting: Family Medicine

## 2022-04-02 ENCOUNTER — Encounter (HOSPITAL_COMMUNITY): Payer: Self-pay | Admitting: Family Medicine

## 2022-04-02 DIAGNOSIS — O479 False labor, unspecified: Secondary | ICD-10-CM | POA: Diagnosis not present

## 2022-04-02 DIAGNOSIS — Z3689 Encounter for other specified antenatal screening: Secondary | ICD-10-CM | POA: Diagnosis not present

## 2022-04-02 DIAGNOSIS — Z3480 Encounter for supervision of other normal pregnancy, unspecified trimester: Secondary | ICD-10-CM

## 2022-04-02 DIAGNOSIS — Z3A31 31 weeks gestation of pregnancy: Secondary | ICD-10-CM | POA: Diagnosis not present

## 2022-04-02 DIAGNOSIS — O368130 Decreased fetal movements, third trimester, not applicable or unspecified: Secondary | ICD-10-CM | POA: Diagnosis present

## 2022-04-02 LAB — URINALYSIS, ROUTINE W REFLEX MICROSCOPIC
Bilirubin Urine: NEGATIVE
Glucose, UA: 50 mg/dL — AB
Hgb urine dipstick: NEGATIVE
Ketones, ur: NEGATIVE mg/dL
Leukocytes,Ua: NEGATIVE
Nitrite: NEGATIVE
Protein, ur: NEGATIVE mg/dL
Specific Gravity, Urine: 1.013 (ref 1.005–1.030)
pH: 6 (ref 5.0–8.0)

## 2022-04-02 NOTE — MAU Note (Signed)
Pt educated on use and purpose of fetal movement marker. Voices understanding.

## 2022-04-02 NOTE — MAU Note (Signed)
.  Sharon Ward is a 26 y.o. at [redacted]w[redacted]d here in MAU reporting: has not felt a lot of fetal movement today. Also c/o braxton hicks ctx off on on the past few days. Yesterday ctx where very strong but not bad or very often today. Denies any vag bleddng but reports she has had a thick white discharge the past few days. Stated she was told she had a yeast infection a few weeks ago but thsy told her she did not have to treat it if it was not bothering her. Reported that she is not having and vaginal irritation. or itching so did not use medication.  LMP:  Onset of complaint: today Pain score:  Vitals:   04/02/22 2010  BP: (!) 139/58  Pulse: 89  Resp: 18  Temp: 98 F (36.7 C)     FHT:143 Lab orders placed from triage:  U/A

## 2022-04-02 NOTE — MAU Provider Note (Signed)
History     CSN: [id number]  Arrival date and time: 04/02/22 1945   Event Date/Time   First Provider Initiated Contact with Patient 04/02/22 2048      Chief Complaint  Patient presents with   Decreased Fetal Movement   Sharon Ward is a 26 y.o. G3P1011 at [redacted]w[redacted]d who receives care at [facility].  She presents today for Decreased Fetal Movement and Occasional Ctx.  She states she last felt movement last night and only 3x today.  She reports trying to drink soda, stomach manipulation, and position change to illicit movement.  She reports she did feel movement in the lobby. She further endorses movement in the room. She reports ctx and pressure yesterday and Wednesday that "was to the point it was hurting to get up and walk."  She reports she tried resting and breathing with some improvement as well as drinking water.  She reports BH ctx and states that Wednesday she "was walking a whole lot."  She reports increased pressure and back pain during that time. She reports some ctx upon arrival that she does not identify as painful, but "like menstrual cramps."     OB History     Gravida  3   Para  1   Term  1   Preterm      AB  1   Living  1      SAB  1   IAB      Ectopic      Multiple  0   Live Births  1           Past Medical History:  Diagnosis Date   Asthma    Bipolar 1 disorder, mixed (HCC)    Depression    Generalized anxiety disorder    Relationship dysfunction     Past Surgical History:  Procedure Laterality Date   PILONIDAL CYST / SINUS EXCISION  09/11/2013   PILONIDAL CYST EXCISION  05/17/2014   Pilonidal cystectomy with cleft lip    Family History  Problem Relation Age of Onset   Asthma Mother    Diabetes Mother    Healthy Mother    Hypertension Father    Asthma Father    Diabetes Father    Healthy Father    Asthma Brother    Hypertension Paternal Uncle    Diabetes Paternal Grandmother    Hypertension Paternal Grandfather    Diabetes  Paternal Grandfather     Social History   Tobacco Use   Smoking status: Former    Packs/day: 1.00    Years: 15.00    Total pack years: 15.00    Types: Cigarettes    Quit date: 07/14/2021    Years since quitting: 0.7   Smokeless tobacco: Never   Tobacco comments:    only smoke a "couple" of cigarettes when stressed or anxious, socially with friends per [facility] chart  Vaping Use   Vaping Use: Never used  Substance Use Topics   Alcohol use: Not Currently    Comment: occasional prior to pregnancy   Drug use: Not Currently    Frequency: 4.0 times per week    Types: Marijuana    Comment: No Delta 9 since February 2023    Allergies:  Allergies  Allergen Reactions   Ascorbate Rash   Citrus Rash   Coconut Flavor Rash   Lamotrigine Rash   Orange (Diagnostic) Rash   Peach Flavor Rash   Pear Rash   Pineapple Rash    Medications Prior  to Admission  Medication Sig Dispense Refill Last Dose   FLUoxetine (PROZAC) 10 MG capsule Take 1 capsule (10 mg total) by mouth daily. 30 capsule 2 04/01/2022   Prenatal Vit-Fe Fumarate-FA (PREPLUS) 27-1 MG TABS Take 1 tablet by mouth at bedtime.   04/01/2022   QUETIAPINE FUMARATE PO Take 75 mg by mouth.   04/01/2022   Blood Pressure Monitoring DEVI 1 each by Does not apply route once a week. 1 each 0    cyclobenzaprine (FLEXERIL) 5 MG tablet Take 1 tablet (5 mg total) by mouth 2 (two) times daily as needed for muscle spasms. Take 1-2 tabs (Patient not taking: Reported on 03/04/2022) 20 tablet 0     Review of Systems  Gastrointestinal:  Positive for abdominal pain (Cramping). Negative for constipation, diarrhea (Loose), nausea (Tuesday) and vomiting.  Genitourinary:  Positive for vaginal discharge (Thick white unsure of odor. No itching or irritation.). Negative for difficulty urinating, dysuria and vaginal bleeding.  Neurological:  Negative for dizziness, light-headedness and headaches.   Physical Exam   Blood pressure (!) 139/58, pulse 89,  temperature 98 F (36.7 C), resp. rate 18, height 5\' 3"  (1.6 m), weight 111.1 kg, last menstrual period 08/16/2021.  Physical Exam Vitals reviewed.  Constitutional:      Appearance: Normal appearance.  HENT:     Head: Normocephalic and atraumatic.  Eyes:     Conjunctiva/sclera: Conjunctivae normal.  Cardiovascular:     Rate and Rhythm: Normal rate.  Pulmonary:     Effort: Pulmonary effort is normal.     Breath sounds: Normal breath sounds.  Abdominal:     General: Bowel sounds are normal.  Musculoskeletal:        General: Normal range of motion.     Cervical back: Normal range of motion.  Skin:    General: Skin is warm and dry.  Neurological:     Mental Status: She is alert and oriented to person, place, and time.  Psychiatric:        Mood and Affect: Mood normal.        Behavior: Behavior normal.     Fetal Assessment 135 bpm, Mod Var, -Decels, +Accels Toco: No ctx  MAU Course   Results for orders placed or performed during the hospital encounter of 04/02/22 (from the past 24 hour(s))  Urinalysis, Routine w reflex microscopic Urine, Clean Catch     Status: Abnormal   Collection Time: 04/02/22  8:14 PM  Result Value Ref Range   Color, Urine YELLOW YELLOW   APPearance HAZY (A) CLEAR   Specific Gravity, Urine 1.013 1.005 - 1.030   pH 6.0 5.0 - 8.0   Glucose, UA 50 (A) NEGATIVE mg/dL   Hgb urine dipstick NEGATIVE NEGATIVE   Bilirubin Urine NEGATIVE NEGATIVE   Ketones, ur NEGATIVE NEGATIVE mg/dL   Protein, ur NEGATIVE NEGATIVE mg/dL   Nitrite NEGATIVE NEGATIVE   Leukocytes,Ua NEGATIVE NEGATIVE   No results found.  MDM PE Labs: UA EFM Assessment and Plan  26 year old G3P1011  SIUP at 31.2 weeks Cat I FT DFM BH Ctx  -POC Reviewed -Reassured that DFM can be noted during times of increased activity.  Further informed that BH contractions can increase with activity and hydration. -Plan to monitor infant and patient to use movement marker when felt.    Cherre RobinsJessica L Ingri Diemer MSN, CNM 04/02/2022, 8:48 PM   Reassessment (9:35 PM) -Patient reports continued movement and states it feels more increased with monitors in place! -Instructed to keep next  appt as scheduled. -Further instructed to return for any concerns of urgent or emergent criteria.  -Patient without questions. -Discharged to home in stable condition.  Cherre Robins MSN, CNM Advanced Practice Provider, [facility] for [employer]

## 2022-04-06 ENCOUNTER — Encounter (HOSPITAL_COMMUNITY): Payer: Self-pay | Admitting: Obstetrics & Gynecology

## 2022-04-06 ENCOUNTER — Inpatient Hospital Stay (HOSPITAL_COMMUNITY)
Admission: AD | Admit: 2022-04-06 | Discharge: 2022-04-06 | Disposition: A | Discharge status: Home / Self Care | Payer: Medicaid Other | Attending: Obstetrics & Gynecology | Admitting: Obstetrics & Gynecology

## 2022-04-06 ENCOUNTER — Other Ambulatory Visit: Payer: Self-pay

## 2022-04-06 DIAGNOSIS — R102 Pelvic and perineal pain: Secondary | ICD-10-CM | POA: Diagnosis not present

## 2022-04-06 DIAGNOSIS — Z3689 Encounter for other specified antenatal screening: Secondary | ICD-10-CM | POA: Insufficient documentation

## 2022-04-06 DIAGNOSIS — Z3480 Encounter for supervision of other normal pregnancy, unspecified trimester: Secondary | ICD-10-CM

## 2022-04-06 DIAGNOSIS — Z3A31 31 weeks gestation of pregnancy: Secondary | ICD-10-CM | POA: Insufficient documentation

## 2022-04-06 DIAGNOSIS — O368130 Decreased fetal movements, third trimester, not applicable or unspecified: Secondary | ICD-10-CM | POA: Insufficient documentation

## 2022-04-06 DIAGNOSIS — O26893 Other specified pregnancy related conditions, third trimester: Secondary | ICD-10-CM | POA: Insufficient documentation

## 2022-04-06 NOTE — MAU Note (Signed)
Bryony Rabe is a 26 y.o. at [redacted]w[redacted]d here in MAU reporting: decreased FM since yesterday.  States only felt 3 movements since yesterday. Reports has eaten this morning.  Denies VB or LOF. LMP: N/A Onset of complaint: weekend Pain score: 0 Vitals:   04/06/22 0929  BP: 133/64  Pulse: 91  Resp: 18  Temp: (!) 97.5 F (36.4 C)  SpO2: 98%     YCX:[id number] d/t maternal apparel, will apply EFM once in room Lab orders placed from triage:   None

## 2022-04-06 NOTE — MAU Provider Note (Signed)
History     CSN: [id number]  Arrival date and time: 04/06/22 2202   Event Date/Time   First Provider Initiated Contact with Patient 04/06/22 1018      Chief Complaint  Patient presents with   Decreased Fetal Movement   [name] is a 26 y.o. year old G57P1011 female at [redacted]w[redacted]d weeks gestation who presents to MAU reporting DFM and increased pelvic pressure with BH ctxs all weekend. She reports feeling contractions "a few times a day." She reports only feeling 3 movements since yesterday. She denies VB or LOF. She receives [facility] with [facility]; next appt is 04/08/2022.    OB History     Gravida  3   Para  1   Term  1   Preterm      AB  1   Living  1      SAB  1   IAB      Ectopic      Multiple  0   Live Births  1           Past Medical History:  Diagnosis Date   Asthma    Bipolar 1 disorder, mixed (HCC)    Depression    Generalized anxiety disorder    Relationship dysfunction     Past Surgical History:  Procedure Laterality Date   PILONIDAL CYST / SINUS EXCISION  09/11/2013   PILONIDAL CYST EXCISION  05/17/2014   Pilonidal cystectomy with cleft lip    Family History  Problem Relation Age of Onset   Asthma Mother    Diabetes Mother    Healthy Mother    Hypertension Father    Asthma Father    Diabetes Father    Healthy Father    Asthma Brother    Hypertension Paternal Uncle    Diabetes Paternal Grandmother    Hypertension Paternal Grandfather    Diabetes Paternal Grandfather     Social History   Tobacco Use   Smoking status: Former    Packs/day: 1.00    Years: 15.00    Total pack years: 15.00    Types: Cigarettes    Quit date: 07/14/2021    Years since quitting: 0.7   Smokeless tobacco: Never   Tobacco comments:    only smoke a "couple" of cigarettes when stressed or anxious, socially with friends per [facility] chart  Vaping Use   Vaping Use: Former   Start date: 08/10/2003   Quit date: 05/04/2021  Substance Use Topics   Alcohol use:  Not Currently    Comment: occasional prior to pregnancy   Drug use: Not Currently    Frequency: 4.0 times per week    Types: Marijuana    Comment: No Delta 9 since February 2023    Allergies:  Allergies  Allergen Reactions   Ascorbate Rash   Citrus Rash   Coconut Flavor Rash   Lamotrigine Rash   Orange (Diagnostic) Rash   Peach Flavor Rash   Pear Rash   Pineapple Rash    Medications Prior to Admission  Medication Sig Dispense Refill Last Dose   FLUoxetine (PROZAC) 10 MG capsule Take 1 capsule (10 mg total) by mouth daily. 30 capsule 2 04/05/2022   Prenatal Vit-Fe Fumarate-FA (PREPLUS) 27-1 MG TABS Take 1 tablet by mouth at bedtime.   04/05/2022   QUETIAPINE FUMARATE PO Take 75 mg by mouth.   04/05/2022   Blood Pressure Monitoring DEVI 1 each by Does not apply route once a week. 1 each 0  cyclobenzaprine (FLEXERIL) 5 MG tablet Take 1 tablet (5 mg total) by mouth 2 (two) times daily as needed for muscle spasms. Take 1-2 tabs (Patient not taking: Reported on 03/04/2022) 20 tablet 0     Review of Systems  Constitutional: Negative.   HENT: Negative.    Eyes: Negative.   Respiratory: Negative.    Cardiovascular: Negative.   Gastrointestinal: Negative.   Endocrine: Negative.   Genitourinary:  Positive for pelvic pain (increased pelvic pressure with BH ctxs).  Musculoskeletal: Negative.   Skin: Negative.   Allergic/Immunologic: Negative.   Neurological: Negative.   Hematological: Negative.   Psychiatric/Behavioral: Negative.     Physical Exam   Blood pressure 133/64, pulse 91, temperature (!) 97.5 F (36.4 C), temperature source Oral, resp. rate 18, height 5\' 3"  (1.6 m), weight 110.1 kg, last menstrual period 08/16/2021, SpO2 98 %.  Physical Exam Vitals and nursing note reviewed. Exam conducted with a chaperone present.  Constitutional:      Appearance: Normal appearance. She is obese.  Cardiovascular:     Rate and Rhythm: Normal rate.  Pulmonary:     Effort: Pulmonary  effort is normal.  Abdominal:     Palpations: Abdomen is soft.  Genitourinary:    General: Normal vulva.     Comments: Dilation: Closed Effacement (%): Thick Station: Ballotable Exam by: 10/14/2021, CNM  Musculoskeletal:        General: Normal range of motion.  Skin:    General: Skin is warm and dry.  Neurological:     Mental Status: She is alert and oriented to person, place, and time.  Psychiatric:        Mood and Affect: Mood normal.        Behavior: Behavior normal.        Thought Content: Thought content normal.        Judgment: Judgment normal.   REACTIVE NST - FHR: 130 bpm / moderate variability / accels present / decels absent / TOCO: none  Reassessment @ 1145: Patient feeling good FM since being in MAU. Pelvic pressure has improved.  MAU Course  Procedures  MDM CEFM SVE FKC clicker - category 1 FHR Tracing   Assessment and Plan  1. Pelvic pressure in pregnancy, antepartum, third trimester  - Information provided on abdominal pain in pregnancy   2. NST (non-stress test) reactive - Reassurance given of good fetal well-being on monitor - Information provided on FKC   3. [redacted] weeks gestation of pregnancy   - Discharge patient - Keep scheduled appt on 04/08/2022 with [facility] - Patient verbalized an understanding of the plan of care and agrees.   [facility], CNM 04/06/2022, 10:18 AM

## 2022-04-07 NOTE — Progress Notes (Unsigned)
   PRENATAL VISIT NOTE  Subjective:  Sharon Ward is a 26 y.o. G3P1011 at [redacted]w[redacted]d being seen today for ongoing prenatal care.  She is currently monitored for the following issues for this {Blank single:19197::"high-risk","low-risk"} pregnancy and has Anxiety; LGSIL on Pap smear of cervix; Oppositional defiant disorder; Pilonidal disease; Suicide attempt (HCC); Prolonged QT interval; Intentional drug overdose (HCC); Bipolar 2 disorder, major depressive episode (HCC); Marijuana abuse; Borderline personality disorder (HCC); PTSD (post-traumatic stress disorder); Bipolar I disorder, most recent episode mixed (HCC); GAD (generalized anxiety disorder); Supervision of other normal pregnancy, antepartum; GBS bacteriuria; Bipolar disorder, rapid cycling (HCC); Chronic constipation; Gastroesophageal reflux disease without esophagitis; History of dysphagia; Mild intermittent asthma without complication; Yeast vaginitis; Echogenic intracardiac focus of fetus on prenatal ultrasound; and Low-lying placenta on their problem list.  Patient reports {sx:14538}.   .  .   . Denies leaking of fluid.   The following portions of the patient's history were reviewed and updated as appropriate: allergies, current medications, past family history, past medical history, past social history, past surgical history and problem list.   Objective:  There were no vitals filed for this visit.  Fetal Status:           General:  Alert, oriented and cooperative. Patient is in no acute distress.  Skin: Skin is warm and dry. No rash noted.   Cardiovascular: Normal heart rate noted  Respiratory: Normal respiratory effort, no problems with respiration noted  Abdomen: Soft, gravid, appropriate for gestational age.        Pelvic: {Blank single:19197::"Cervical exam performed in the presence of a chaperone","Cervical exam deferred"}        Extremities: Normal range of motion.     Mental Status: Normal mood and affect. Normal behavior.  Normal judgment and thought content.   Assessment and Plan:  Pregnancy: G3P1011 at [redacted]w[redacted]d There are no diagnoses linked to this encounter. {Blank single:19197::"Term","Preterm"} labor symptoms and general obstetric precautions including but not limited to vaginal bleeding, contractions, leaking of fluid and fetal movement were reviewed in detail with the patient. Please refer to After Visit Summary for other counseling recommendations.   No follow-ups on file.  Future Appointments  Date Time Provider Department Center  04/08/2022  1:15 PM Madlyn Frankel [facility]  04/16/2022 12:30 PM [facility] NURSE [facility]  04/16/2022 12:45 PM [facility] US4 [facility]  04/22/2022  1:35 PM Marylene Land, CNM [facility]  05/05/2022  2:55 PM Bernerd Limbo, CNM [facility]  05/13/2022  1:55 PM Marylene Land, CNM [facility]  05/19/2022  2:55 PM Bernerd Limbo, CNM [facility]  05/26/2022  2:55 PM Bernerd Limbo, CNM [facility]  06/02/2022  2:55 PM Bernerd Limbo, CNM [facility]    Marylene Land, [place]

## 2022-04-08 ENCOUNTER — Ambulatory Visit (INDEPENDENT_AMBULATORY_CARE_PROVIDER_SITE_OTHER): Payer: Medicaid Other | Admitting: Student

## 2022-04-08 VITALS — BP 115/72 | HR 83 | Wt 242.7 lb

## 2022-04-08 DIAGNOSIS — Z3A32 32 weeks gestation of pregnancy: Secondary | ICD-10-CM

## 2022-04-08 DIAGNOSIS — Z3480 Encounter for supervision of other normal pregnancy, unspecified trimester: Secondary | ICD-10-CM

## 2022-04-08 DIAGNOSIS — Z3483 Encounter for supervision of other normal pregnancy, third trimester: Secondary | ICD-10-CM | POA: Diagnosis not present

## 2022-04-08 DIAGNOSIS — O368130 Decreased fetal movements, third trimester, not applicable or unspecified: Secondary | ICD-10-CM | POA: Diagnosis not present

## 2022-04-08 NOTE — Progress Notes (Signed)
Patient stated that she went to MAU Friday and Monday due to decreased FM. She stated that she was put on the monitor and then sent home due to reactive NST. Patient stated that she is becoming frustrated because she is concerned about FM but "no one is telling me anything"  Sharon Ward stated she felt baby move this morning and "some" this afternoon

## 2022-04-13 ENCOUNTER — Inpatient Hospital Stay (HOSPITAL_COMMUNITY)
Admission: AD | Admit: 2022-04-13 | Discharge: 2022-04-14 | Disposition: A | Discharge status: Home / Self Care | Payer: Medicaid Other | Attending: Obstetrics & Gynecology | Admitting: Obstetrics & Gynecology

## 2022-04-13 DIAGNOSIS — Z3A33 33 weeks gestation of pregnancy: Secondary | ICD-10-CM | POA: Insufficient documentation

## 2022-04-13 DIAGNOSIS — R0602 Shortness of breath: Secondary | ICD-10-CM | POA: Insufficient documentation

## 2022-04-13 DIAGNOSIS — R519 Headache, unspecified: Secondary | ICD-10-CM | POA: Insufficient documentation

## 2022-04-13 DIAGNOSIS — Z77098 Contact with and (suspected) exposure to other hazardous, chiefly nonmedicinal, chemicals: Secondary | ICD-10-CM | POA: Insufficient documentation

## 2022-04-13 DIAGNOSIS — Z3689 Encounter for other specified antenatal screening: Secondary | ICD-10-CM

## 2022-04-13 DIAGNOSIS — R070 Pain in throat: Secondary | ICD-10-CM | POA: Insufficient documentation

## 2022-04-13 DIAGNOSIS — O26893 Other specified pregnancy related conditions, third trimester: Secondary | ICD-10-CM | POA: Insufficient documentation

## 2022-04-13 DIAGNOSIS — Z87891 Personal history of nicotine dependence: Secondary | ICD-10-CM | POA: Insufficient documentation
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
## 2022-04-14 DIAGNOSIS — R070 Pain in throat: Secondary | ICD-10-CM | POA: Diagnosis not present

## 2022-04-14 DIAGNOSIS — Z3A33 33 weeks gestation of pregnancy: Secondary | ICD-10-CM

## 2022-04-14 DIAGNOSIS — Z77098 Contact with and (suspected) exposure to other hazardous, chiefly nonmedicinal, chemicals: Secondary | ICD-10-CM

## 2022-04-14 DIAGNOSIS — R519 Headache, unspecified: Secondary | ICD-10-CM | POA: Diagnosis not present

## 2022-04-14 DIAGNOSIS — Z87891 Personal history of nicotine dependence: Secondary | ICD-10-CM | POA: Diagnosis not present

## 2022-04-14 DIAGNOSIS — O26893 Other specified pregnancy related conditions, third trimester: Secondary | ICD-10-CM | POA: Diagnosis not present

## 2022-04-14 DIAGNOSIS — Z3689 Encounter for other specified antenatal screening: Secondary | ICD-10-CM | POA: Diagnosis not present

## 2022-04-14 DIAGNOSIS — R0602 Shortness of breath: Secondary | ICD-10-CM | POA: Diagnosis not present

## 2022-04-14 MED ORDER — MENTHOL 3 MG MT LOZG
1.0000 | LOZENGE | Freq: Once | OROMUCOSAL | Status: AC
Start: 2022-04-14 — End: 2022-04-14
  Administered 2022-04-14: 3 mg via ORAL
  Filled 2022-04-14: qty 9

## 2022-04-14 MED ORDER — ACETAMINOPHEN 500 MG PO TABS
1000.0000 mg | ORAL_TABLET | Freq: Once | ORAL | Status: AC
  Administered 2022-04-14: 1000 mg via ORAL
  Filled 2022-04-14: qty 2
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
## 2022-04-16 ENCOUNTER — Ambulatory Visit: Payer: Medicaid Other | Attending: Maternal & Fetal Medicine

## 2022-04-16 ENCOUNTER — Ambulatory Visit: Payer: Medicaid Other | Admitting: *Deleted

## 2022-04-16 VITALS — BP 118/64 | HR 81

## 2022-04-16 DIAGNOSIS — O4443 Low lying placenta NOS or without hemorrhage, third trimester: Secondary | ICD-10-CM | POA: Diagnosis not present

## 2022-04-16 DIAGNOSIS — O35BXX0 Maternal care for other (suspected) fetal abnormality and damage, fetal cardiac anomalies, not applicable or unspecified: Secondary | ICD-10-CM | POA: Diagnosis not present

## 2022-04-16 DIAGNOSIS — O358XX0 Maternal care for other (suspected) fetal abnormality and damage, not applicable or unspecified: Secondary | ICD-10-CM | POA: Insufficient documentation

## 2022-04-16 DIAGNOSIS — O99323 Drug use complicating pregnancy, third trimester: Secondary | ICD-10-CM | POA: Insufficient documentation

## 2022-04-16 DIAGNOSIS — Z3480 Encounter for supervision of other normal pregnancy, unspecified trimester: Secondary | ICD-10-CM

## 2022-04-16 DIAGNOSIS — O99343 Other mental disorders complicating pregnancy, third trimester: Secondary | ICD-10-CM | POA: Diagnosis not present

## 2022-04-16 DIAGNOSIS — O99213 Obesity complicating pregnancy, third trimester: Secondary | ICD-10-CM | POA: Insufficient documentation

## 2022-04-16 DIAGNOSIS — O283 Abnormal ultrasonic finding on antenatal screening of mother: Secondary | ICD-10-CM

## 2022-04-16 DIAGNOSIS — O321XX0 Maternal care for breech presentation, not applicable or unspecified: Secondary | ICD-10-CM | POA: Insufficient documentation

## 2022-04-16 DIAGNOSIS — Z6838 Body mass index (BMI) 38.0-38.9, adult: Secondary | ICD-10-CM

## 2022-04-16 DIAGNOSIS — O4440 Low lying placenta NOS or without hemorrhage, unspecified trimester: Secondary | ICD-10-CM

## 2022-04-16 DIAGNOSIS — Z3A33 33 weeks gestation of pregnancy: Secondary | ICD-10-CM | POA: Insufficient documentation

## 2022-04-16 DIAGNOSIS — F1290 Cannabis use, unspecified, uncomplicated: Secondary | ICD-10-CM

## 2022-04-16 DIAGNOSIS — Z362 Encounter for other antenatal screening follow-up: Secondary | ICD-10-CM | POA: Insufficient documentation

## 2022-04-16 DIAGNOSIS — E669 Obesity, unspecified: Secondary | ICD-10-CM

## 2022-04-18 ENCOUNTER — Inpatient Hospital Stay (HOSPITAL_COMMUNITY)
Admission: AD | Admit: 2022-04-18 | Discharge: 2022-04-18 | Disposition: A | Discharge status: Home / Self Care | Payer: Medicaid Other | Attending: Obstetrics and Gynecology | Admitting: Obstetrics and Gynecology

## 2022-04-18 DIAGNOSIS — Y9301 Activity, walking, marching and hiking: Secondary | ICD-10-CM | POA: Insufficient documentation

## 2022-04-18 DIAGNOSIS — O26893 Other specified pregnancy related conditions, third trimester: Secondary | ICD-10-CM | POA: Insufficient documentation

## 2022-04-18 DIAGNOSIS — T63444A Toxic effect of venom of bees, undetermined, initial encounter: Secondary | ICD-10-CM | POA: Diagnosis not present

## 2022-04-18 DIAGNOSIS — Y9289 Other specified places as the place of occurrence of the external cause: Secondary | ICD-10-CM | POA: Insufficient documentation

## 2022-04-18 DIAGNOSIS — W010XXA Fall on same level from slipping, tripping and stumbling without subsequent striking against object, initial encounter: Secondary | ICD-10-CM | POA: Diagnosis not present

## 2022-04-18 DIAGNOSIS — Z3A33 33 weeks gestation of pregnancy: Secondary | ICD-10-CM | POA: Insufficient documentation

## 2022-04-18 DIAGNOSIS — M7989 Other specified soft tissue disorders: Secondary | ICD-10-CM | POA: Diagnosis not present
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
## 2022-04-18 NOTE — MAU Provider Note (Signed)
History     CSN: [id number]  Arrival date and time: 04/18/22 2016   None     Chief Complaint  Patient presents with   Insect Bite   HPI Patient Sharon Ward is a 26 y.o.  G3P1011  At [redacted]w[redacted]d here with concerns for bee sting at 6 pm while on the train platform. She denies any vaginal bleeding, contractions, nausea, vomiting, diarrhea, SOB, decreased fetal movements. She reports that her ankle hurts as well; she has no ob-gyn complaints.   She spent the day in [place] with her grandparents and took the [employer] back this evening. She reports that she has been walking "a lot" today.    She also reports that she tripped and fell on her ankle on Wednesday.  She reports that she has broken her foot a year ago and that it still hurts her. She has tried elevating it and it does not help. She has tried ice and heating and it would "not go down". She says that her foot has been "mad swollen" and that two "nurses from my team" had looked at it.  OB History     Gravida  3   Para  1   Term  1   Preterm      AB  1   Living  1      SAB  1   IAB      Ectopic      Multiple  0   Live Births  1           Past Medical History:  Diagnosis Date   Asthma    Bipolar 1 disorder, mixed (HCC)    Depression    Generalized anxiety disorder    Relationship dysfunction     Past Surgical History:  Procedure Laterality Date   PILONIDAL CYST / SINUS EXCISION  09/11/2013   PILONIDAL CYST EXCISION  05/17/2014   Pilonidal cystectomy with cleft lip    Family History  Problem Relation Age of Onset   Asthma Mother    Diabetes Mother    Healthy Mother    Hypertension Father    Asthma Father    Diabetes Father    Healthy Father    Asthma Brother    Hypertension Paternal Uncle    Diabetes Paternal Grandmother    Stroke Paternal Grandfather    Heart disease Paternal Grandfather    Hypertension Paternal Grandfather    Diabetes Paternal Grandfather     Social History   Tobacco  Use   Smoking status: Former    Packs/day: 1.00    Years: 15.00    Total pack years: 15.00    Types: Cigarettes    Quit date: 07/14/2021    Years since quitting: 0.7   Smokeless tobacco: Never   Tobacco comments:    only smoke a "couple" of cigarettes when stressed or anxious, socially with friends per [facility] chart  Vaping Use   Vaping Use: Former   Start date: 08/10/2003   Quit date: 05/04/2021  Substance Use Topics   Alcohol use: Not Currently    Comment: occasional prior to pregnancy   Drug use: Not Currently    Frequency: 4.0 times per week    Types: Marijuana    Comment: No Delta 9 since February 2023    Allergies:  Allergies  Allergen Reactions   Ascorbate Rash   Citrus Rash   Coconut Flavor Rash   Lamotrigine Rash   Orange (Diagnostic) Rash   Peach Flavor  Rash   Pear Rash   Pineapple Rash    Medications Prior to Admission  Medication Sig Dispense Refill Last Dose   Blood Pressure Monitoring DEVI 1 each by Does not apply route once a week. 1 each 0    FLUoxetine (PROZAC) 10 MG capsule Take 1 capsule (10 mg total) by mouth daily. 30 capsule 2    Prenatal Vit-Fe Fumarate-FA (PREPLUS) 27-1 MG TABS Take 1 tablet by mouth at bedtime.      QUETIAPINE FUMARATE PO Take 75 mg by mouth.       Review of Systems  HENT: Negative.    Respiratory: Negative.    Cardiovascular: Negative.   Gastrointestinal: Negative.   Genitourinary: Negative.   Neurological: Negative.   Hematological: Negative.   Psychiatric/Behavioral: Negative.     Physical Exam   Blood pressure 126/73, pulse 99, temperature 97.9 F (36.6 C), temperature source Oral, resp. rate 18, height 5\' 3"  (1.6 m), weight 111.3 kg, last menstrual period 08/16/2021, SpO2 98 %.  Physical Exam Cardiovascular:     Pulses:          Dorsalis pedis pulses are 3+ on the left side.  Pulmonary:     Effort: Pulmonary effort is normal.  Abdominal:     General: Abdomen is flat.  Musculoskeletal:        General: Normal  range of motion.     Left foot: Normal range of motion. No deformity or foot drop.  Feet:     Right foot:     Skin integrity: Skin integrity normal.     Toenail Condition: Right toenails are normal.     Left foot:     Skin integrity: Skin integrity normal. No skin breakdown or dry skin.     Toenail Condition: Left toenails are normal.  Skin:    General: Skin is warm.  Neurological:     General: No focal deficit present.     Mental Status: She is alert.  Psychiatric:        Mood and Affect: Mood normal.   Right ankle is normal, left ankle has some slight swelling around the ankle but no bruising, slightly tender to palpation, positive pedal pulses and good color  MAU Course  Procedures  MDM -Patient seen and evaluated in triage due to census and acuity of patients -bee sting is barely visible, no redness, no heat -ankle has good ROM, otherwise normal appearing with slight swelling but no bruising. She is able to ambulate, put socks and shoes on and off.  Assessment and Plan   1. Bee sting, undetermined intent, initial encounter   -educated patient on bee sting will not harm baby -patient advised to go to [facility] in the morning if she continues to have pain; recommended Tylenol, elevation, ice, purchase brace at [facility] -patient is carrying heavy bags of luggage, explained that carrying heavy bags will cause pain and make her ankle hurt worse Patient discharged home with return precautions for OB and [facility] precautions for her ankle [facility] Lerone Onder 04/18/2022, 8:54 PM

## 2022-04-19 NOTE — Progress Notes (Signed)
   PRENATAL VISIT NOTE  Subjective:  Sharon Ward is a 25 y.o. G3P1011 at [redacted]w[redacted]d being seen today for ongoing prenatal care.  She is currently monitored for the following issues for this low-risk pregnancy and has Anxiety; LGSIL on Pap smear of cervix; Oppositional defiant disorder; Pilonidal disease; Suicide attempt (HCC); Prolonged QT interval; Intentional drug overdose (HCC); Bipolar 2 disorder, major depressive episode (HCC); Marijuana abuse; Borderline personality disorder (HCC); PTSD (post-traumatic stress disorder); Bipolar I disorder, most recent episode mixed (HCC); GAD (generalized anxiety disorder); Supervision of other normal pregnancy, antepartum; GBS bacteriuria; Bipolar disorder, rapid cycling (HCC); Chronic constipation; Gastroesophageal reflux disease without esophagitis; History of dysphagia; Mild intermittent asthma without complication; Yeast vaginitis; Echogenic intracardiac focus of fetus on prenatal ultrasound; and Low-lying placenta on their problem list.  Patient reports no complaints.  Contractions: Irritability. Vag. Bleeding: None.  Movement: Present. Denies leaking of fluid.   The following portions of the patient's history were reviewed and updated as appropriate: allergies, current medications, past family history, past medical history, past social history, past surgical history and problem list.   Objective:   Vitals:   04/22/22 1320  BP: 131/72  Pulse: 80  Weight: 247 lb (112 kg)    Fetal Status: Fetal Heart Rate (bpm): 135 Fundal Height: 37 cm Movement: Present     General:  Alert, oriented and cooperative. Patient is in no acute distress.  Skin: Skin is warm and dry. No rash noted.   Cardiovascular: Normal heart rate noted  Respiratory: Normal respiratory effort, no problems with respiration noted  Abdomen: Soft, gravid, appropriate for gestational age.  Pain/Pressure: Absent     Pelvic: Cervical exam deferred        Extremities: Normal range of motion.   Edema: None  Mental Status: Normal mood and affect. Normal behavior. Normal judgment and thought content.   Assessment and Plan:  Pregnancy: G3P1011 at [redacted]w[redacted]d  1. [redacted] weeks gestation of pregnancy   2. Supervision of other normal pregnancy, antepartum    -FH measures large today but she just had a dating [place] on 9-8 with normal fluid; consider follow up growth as needed; she does not have diabetes -feels relieved that bee sting is better -did not address waterbirth but will discuss at next visit  Preterm labor symptoms and general obstetric precautions including but not limited to vaginal bleeding, contractions, leaking of fluid and fetal movement were reviewed in detail with the patient. Please refer to After Visit Summary for other counseling recommendations.   No follow-ups on file.  Future Appointments  Date Time Provider Department Center  05/05/2022  2:55 PM Osborne Oman [facility]  05/13/2022  1:55 PM Madlyn Frankel [facility]  05/19/2022  2:55 PM Bernerd Limbo, CNM [facility]  05/26/2022  2:55 PM Bernerd Limbo, CNM [facility]  06/02/2022  2:55 PM Bernerd Limbo, CNM [facility]    Marylene Land, CNM

## 2022-04-21 ENCOUNTER — Ambulatory Visit (HOSPITAL_COMMUNITY)
Admission: EM | Admit: 2022-04-21 | Discharge: 2022-04-21 | Disposition: A | Discharge status: Home / Self Care | Payer: Medicaid Other

## 2022-04-21 ENCOUNTER — Encounter (HOSPITAL_COMMUNITY): Payer: Self-pay

## 2022-04-21 DIAGNOSIS — T63441D Toxic effect of venom of bees, accidental (unintentional), subsequent encounter: Secondary | ICD-10-CM | POA: Diagnosis not present

## 2022-04-21 DIAGNOSIS — L539 Erythematous condition, unspecified: Secondary | ICD-10-CM | POA: Diagnosis not present

## 2022-04-21 NOTE — ED Triage Notes (Signed)
Pt states she was stung on her right thigh 3 days ago,states the area is still red and she would like someone to look at it since she is [redacted] weeks pregnant.

## 2022-04-21 NOTE — Discharge Instructions (Addendum)
There is no sign of infection. The redness will continue to improve.  You can use Neosporin plus pain Twice a day to the site after cleaning with warm water and soap.  For the itch you can apply benadryl cream Twice a day

## 2022-04-21 NOTE — ED Provider Notes (Signed)
[facility]    CSN: [id number] Arrival date & time: 04/21/22  1144      History   Chief Complaint Chief Complaint  Patient presents with   Insect Bite    HPI Sharon Ward is a 26 y.o. female.   HPI  She is in today complaining of a insect bite. She states that it was a bee sting. She states that her grandmother took out the stinger. She was seen by her CNW. She reports that she was told that she would be fine. She is just wanting a second opinion due to her pregnancy. She has never been sting by a yellow jacket before. She did have some pus drainage for 2 days. She has tenderness. Denies fever, chills, headache, dizziness, shortness of breath, dyspnea on exertion, chest pain, nausea, vomiting or any edema.    Past Medical History:  Diagnosis Date   Asthma    Bipolar 1 disorder, mixed (HCC)    Depression    Generalized anxiety disorder    Relationship dysfunction     Patient Active Problem List   Diagnosis Date Noted   Echogenic intracardiac focus of fetus on prenatal ultrasound 01/07/2022   Low-lying placenta 01/07/2022   GBS bacteriuria 11/09/2021   Bipolar disorder, rapid cycling (HCC) 11/09/2021   Supervision of other normal pregnancy, antepartum 10/21/2021   Bipolar I disorder, most recent episode mixed (HCC) 09/24/2021   GAD (generalized anxiety disorder) 09/24/2021   Marijuana abuse 05/05/2021   Bipolar 2 disorder, major depressive episode (HCC) 02/05/2021   Intentional drug overdose (HCC) 11/03/2020   Suicide attempt (HCC) 11/02/2020   Prolonged QT interval 11/02/2020   Anxiety 09/18/2019   LGSIL on Pap smear of cervix 09/18/2019   Oppositional defiant disorder 09/18/2019   History of dysphagia 02/01/2018   Yeast vaginitis 02/01/2018   Chronic constipation 04/25/2017   Borderline personality disorder (HCC) 10/24/2016   PTSD (post-traumatic stress disorder) 10/24/2016   Gastroesophageal reflux disease without esophagitis 10/24/2016   Mild  intermittent asthma without complication 10/24/2016   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    Past Surgical History:  Procedure Laterality Date   PILONIDAL CYST / SINUS EXCISION  09/11/2013   PILONIDAL CYST EXCISION  05/17/2014   Pilonidal cystectomy with cleft lip    OB History     Gravida  3   Para  1   Term  1   Preterm      AB  1   Living  1      SAB  1   IAB      Ectopic      Multiple  0   Live Births  1            Home Medications    Prior to Admission medications   Medication Sig Start Date End Date Taking? Authorizing Provider  Blood Pressure Monitoring DEVI 1 each by Does not apply route once a week. 10/21/21   Levie Heritage, DO  FLUoxetine (PROZAC) 10 MG capsule Take 1 capsule (10 mg total) by mouth daily. 09/24/21   Nwoko, Tommas Olp, PA  Prenatal Vit-Fe Fumarate-FA (PREPLUS) 27-1 MG TABS Take 1 tablet by mouth at bedtime.    [provider]  QUETIAPINE FUMARATE PO Take 75 mg by mouth.    [provider]    Family History Family History  Problem Relation Age of Onset   Asthma Mother    Diabetes Mother    Healthy Mother    Hypertension Father  Asthma Father    Diabetes Father    Healthy Father    Asthma Brother    Hypertension Paternal Uncle    Diabetes Paternal Grandmother    Stroke Paternal Grandfather    Heart disease Paternal Grandfather    Hypertension Paternal Grandfather    Diabetes Paternal Grandfather     Social History Social History   Tobacco Use   Smoking status: Former    Packs/day: 1.00    Years: 15.00    Total pack years: 15.00    Types: Cigarettes    Quit date: 07/14/2021    Years since quitting: 0.7   Smokeless tobacco: Never   Tobacco comments:    only smoke a "couple" of cigarettes when stressed or anxious, socially with friends per [facility] chart  Vaping Use   Vaping Use: Former   Start date: 08/10/2003   Quit date: 05/04/2021  Substance Use Topics   Alcohol use: Not Currently    Comment:  occasional prior to pregnancy   Drug use: Not Currently    Frequency: 4.0 times per week    Types: Marijuana    Comment: No Delta 9 since February 2023     Allergies   Ascorbate, Citrus, Coconut flavor, Lamotrigine, Orange (diagnostic), Peach flavor, Pear, and Pineapple   Review of Systems Review of Systems   Physical Exam Triage Vital Signs ED Triage Vitals  Enc Vitals Group     BP 04/21/22 1304 129/75     Pulse Rate 04/21/22 1304 82     Resp 04/21/22 1304 16     Temp 04/21/22 1304 98.1 F (36.7 C)     Temp Source 04/21/22 1304 Oral     SpO2 04/21/22 1304 100 %     Weight --      Height --      Head Circumference --      Peak Flow --      Pain Score 04/21/22 1306 0     Pain Loc --      Pain Edu? --      Excl. in GC? --    No data found.  Updated Vital Signs BP 129/75   Pulse 82   Temp 98.1 F (36.7 C) (Oral)   Resp 16   LMP 08/16/2021 (Exact Date)   SpO2 100%   Visual Acuity Right Eye Distance:   Left Eye Distance:   Bilateral Distance:    Right Eye Near:   Left Eye Near:    Bilateral Near:     Physical Exam Constitutional:      General: She is not in acute distress.    Appearance: She is not ill-appearing, toxic-appearing or diaphoretic.  HENT:     Head: Normocephalic and atraumatic.  Cardiovascular:     Rate and Rhythm: Normal rate.  Pulmonary:     Effort: Pulmonary effort is normal.  Musculoskeletal:        General: Normal range of motion.  Skin:    General: Skin is warm and dry.     Capillary Refill: Capillary refill takes less than 2 seconds.     Findings: Erythema (small amount from 3-6 o'clock) present. No bruising or rash.     Comments: Tenderness at the site with palpation   Neurological:     General: No focal deficit present.     Mental Status: She is alert and oriented to person, place, and time.  Psychiatric:        Mood and Affect: Mood normal.  UC Treatments / Results  Labs (all labs ordered are listed, but only  abnormal results are displayed) Labs Reviewed - No data to display  EKG   Radiology No results found.  Procedures Procedures (including critical care time)  Medications Ordered in UC Medications - No data to display  Initial Impression / Assessment and Plan / UC Course  I have reviewed the triage vital signs and the nursing notes.  Pertinent labs & imaging results that were available during my care of the patient were reviewed by me and considered in my medical decision making (see chart for details).    Bee sting  Final Clinical Impressions(s) / UC Diagnoses   Final diagnoses:  Bee sting, accidental or unintentional, subsequent encounter     Discharge Instructions      There is no sign of infection. The redness will continue to improve.  You can use Neosporin plus pain Twice a day to the site after cleaning with warm water and soap.  For the itch you can apply benadryl cream Twice a day    ED Prescriptions   None    PDMP not reviewed this encounter.   Thad Ranger Valders, NP 04/21/22 1425

## 2022-04-22 ENCOUNTER — Other Ambulatory Visit: Payer: Self-pay

## 2022-04-22 ENCOUNTER — Ambulatory Visit (INDEPENDENT_AMBULATORY_CARE_PROVIDER_SITE_OTHER): Payer: Medicaid Other | Admitting: Student

## 2022-04-22 VITALS — BP 131/72 | HR 80 | Wt 247.0 lb

## 2022-04-22 DIAGNOSIS — Z3A33 33 weeks gestation of pregnancy: Secondary | ICD-10-CM

## 2022-04-22 DIAGNOSIS — Z3483 Encounter for supervision of other normal pregnancy, third trimester: Secondary | ICD-10-CM

## 2022-04-22 DIAGNOSIS — Z3480 Encounter for supervision of other normal pregnancy, unspecified trimester: Secondary | ICD-10-CM

## 2022-04-22 DIAGNOSIS — Z3A34 34 weeks gestation of pregnancy: Secondary | ICD-10-CM

## 2022-04-26 ENCOUNTER — Inpatient Hospital Stay (HOSPITAL_COMMUNITY)
Admission: AD | Admit: 2022-04-26 | Discharge: 2022-04-26 | Disposition: A | Discharge status: Home / Self Care | Payer: Medicaid Other | Attending: Obstetrics and Gynecology | Admitting: Obstetrics and Gynecology

## 2022-04-26 ENCOUNTER — Encounter (HOSPITAL_COMMUNITY): Payer: Self-pay | Admitting: Obstetrics and Gynecology

## 2022-04-26 DIAGNOSIS — Z3A34 34 weeks gestation of pregnancy: Secondary | ICD-10-CM | POA: Insufficient documentation

## 2022-04-26 DIAGNOSIS — O4703 False labor before 37 completed weeks of gestation, third trimester: Secondary | ICD-10-CM | POA: Diagnosis present

## 2022-04-26 DIAGNOSIS — Z3480 Encounter for supervision of other normal pregnancy, unspecified trimester: Secondary | ICD-10-CM

## 2022-04-26 NOTE — MAU Provider Note (Signed)
History     CSN: [id number]  Arrival date and time: 04/26/22 1745   Event Date/Time   First Provider Initiated Contact with Patient 04/26/22 1857      Chief Complaint  Patient presents with   Contractions   HPI [name] is a 26 y.o. year old G43P1011 female at [redacted]w[redacted]d weeks gestation who presents to MAU reporting she started having contractions at 1600 today. She reports the contractions are coming every 1-29 mins; "I've been timing them on my phone." She also reports that "my OB screwed up my due date. I think I am further along than they say." She states "I live alone and I am afraid something would happen, so I wanted to come get checked out." She denies any VB or LOF. She reports (+) FM. She is requesting to have a cervical exam. She receives [facility] with [facility]; next appt is 05/05/2022.   OB History     Gravida  3   Para  1   Term  1   Preterm      AB  1   Living  1      SAB  1   IAB      Ectopic      Multiple  0   Live Births  1           Past Medical History:  Diagnosis Date   Asthma    Bipolar 1 disorder, mixed (HCC)    Depression    Generalized anxiety disorder    Relationship dysfunction     Past Surgical History:  Procedure Laterality Date   PILONIDAL CYST / SINUS EXCISION  09/11/2013   PILONIDAL CYST EXCISION  05/17/2014   Pilonidal cystectomy with cleft lip    Family History  Problem Relation Age of Onset   Asthma Mother    Diabetes Mother    Healthy Mother    Hypertension Father    Asthma Father    Diabetes Father    Healthy Father    Asthma Brother    Hypertension Paternal Uncle    Diabetes Paternal Grandmother    Stroke Paternal Grandfather    Heart disease Paternal Grandfather    Hypertension Paternal Grandfather    Diabetes Paternal Grandfather     Social History   Tobacco Use   Smoking status: Former    Packs/day: 1.00    Years: 15.00    Total pack years: 15.00    Types: Cigarettes    Quit date: 07/14/2021     Years since quitting: 0.7   Smokeless tobacco: Never   Tobacco comments:    only smoke a "couple" of cigarettes when stressed or anxious, socially with friends per [facility] chart  Vaping Use   Vaping Use: Former   Start date: 08/10/2003   Quit date: 05/04/2021  Substance Use Topics   Alcohol use: Not Currently    Comment: occasional prior to pregnancy   Drug use: Not Currently    Frequency: 4.0 times per week    Types: Marijuana    Comment: No Delta 9 since February 2023    Allergies:  Allergies  Allergen Reactions   Ascorbate Rash   Citrus Rash   Coconut Flavor Rash   Lamotrigine Rash   Orange (Diagnostic) Rash   Peach Flavor Rash   Pear Rash   Pineapple Rash    Medications Prior to Admission  Medication Sig Dispense Refill Last Dose   Blood Pressure Monitoring DEVI 1 each by Does not apply  route once a week. 1 each 0 Past Week   FLUoxetine (PROZAC) 10 MG capsule Take 1 capsule (10 mg total) by mouth daily. 30 capsule 2 04/25/2022   Prenatal Vit-Fe Fumarate-FA (PREPLUS) 27-1 MG TABS Take 1 tablet by mouth at bedtime.   04/25/2022   QUETIAPINE FUMARATE PO Take 75 mg by mouth.   04/25/2022    Review of Systems  Constitutional: Negative.   HENT: Negative.    Eyes: Negative.   Respiratory: Negative.    Cardiovascular: Negative.   Gastrointestinal: Negative.   Endocrine: Negative.   Genitourinary:  Positive for pelvic pain (occ UC's every 1-29 mins).  Musculoskeletal: Negative.   Skin: Negative.   Allergic/Immunologic: Negative.   Neurological: Negative.   Hematological: Negative.   Psychiatric/Behavioral: Negative.     Physical Exam   Blood pressure 132/81, pulse 98, temperature 98.1 F (36.7 C), resp. rate 18, height 5\' 3"  (1.6 m), weight 110.2 kg, last menstrual period 08/16/2021.  Physical Exam Vitals and nursing note reviewed. Exam conducted with a chaperone present.  Constitutional:      Appearance: Normal appearance. She is obese.  Cardiovascular:     Rate and  Rhythm: Normal rate.  Pulmonary:     Effort: Pulmonary effort is normal.  Abdominal:     Palpations: Abdomen is soft.  Genitourinary:    General: Normal vulva.  Musculoskeletal:        General: Normal range of motion.  Skin:    General: Skin is warm and dry.  Neurological:     Mental Status: She is alert and oriented to person, place, and time.  Psychiatric:        Mood and Affect: Mood normal.        Behavior: Behavior normal.        Thought Content: Thought content normal.        Judgment: Judgment normal.   REACTIVE NST - FHR: 130 bpm / moderate variability / accels present / decels absent / TOCO: irregular UC's with UI noted  Reassessment @ 1940: Patient d/c'd EFM from abdomen and getting dressed stating she "needs to catch the bus home." MAU Course  Procedures  MDM EFM  SVE Offered Procardia to slow/stop contractions>> patient declined  Assessment and Plan  Preterm uterine contractions in third trimester, antepartum  - Reassurance given that the contractions she is experiencing have not changed her cervix - Advised to stay well-hydrated  [redacted] weeks gestation of pregnancy   - Discharge patient - Keep scheduled appt with [facility] on 05/05/2022 - Patient verbalized an understanding of the plan of care and agrees.     Laury Deep, CNM 04/26/2022, 6:57 PM

## 2022-04-26 NOTE — MAU Note (Signed)
.  Sharon Ward is a 26 y.o. at [redacted]w[redacted]d here in MAU reporting: ctx started at 4p .  Ct q 1-29 min. Stated her OB "screwed up" her due date and feels she is further along than she is.  Wants her cervix checked . Denies any vag bleeding or leaking today. Good fetal movement felt.  LMP:  Onset of complaint: 4pm Pain score: 7 Vitals:   04/26/22 1820  BP: 132/81  Pulse: 98  Resp: 18  Temp: 98.1 F (36.7 C)     FHT:137 Lab orders placed from triage:

## 2022-05-03 ENCOUNTER — Encounter (HOSPITAL_COMMUNITY): Payer: Self-pay | Admitting: Obstetrics and Gynecology

## 2022-05-03 ENCOUNTER — Inpatient Hospital Stay (HOSPITAL_COMMUNITY)
Admission: AD | Admit: 2022-05-03 | Discharge: 2022-05-03 | Disposition: A | Discharge status: Home / Self Care | Payer: Medicaid Other | Attending: Obstetrics and Gynecology | Admitting: Obstetrics and Gynecology

## 2022-05-03 DIAGNOSIS — O26893 Other specified pregnancy related conditions, third trimester: Secondary | ICD-10-CM | POA: Insufficient documentation

## 2022-05-03 DIAGNOSIS — O99333 Smoking (tobacco) complicating pregnancy, third trimester: Secondary | ICD-10-CM | POA: Diagnosis present

## 2022-05-03 DIAGNOSIS — Z3A35 35 weeks gestation of pregnancy: Secondary | ICD-10-CM | POA: Diagnosis present

## 2022-05-03 DIAGNOSIS — Z3689 Encounter for other specified antenatal screening: Secondary | ICD-10-CM

## 2022-05-03 DIAGNOSIS — R109 Unspecified abdominal pain: Secondary | ICD-10-CM

## 2022-05-03 LAB — URINALYSIS, ROUTINE W REFLEX MICROSCOPIC
Bilirubin Urine: NEGATIVE
Glucose, UA: NEGATIVE mg/dL
Hgb urine dipstick: NEGATIVE
Ketones, ur: NEGATIVE mg/dL
Leukocytes,Ua: NEGATIVE
Nitrite: NEGATIVE
Protein, ur: NEGATIVE mg/dL
Specific Gravity, Urine: 1.01 (ref 1.005–1.030)
pH: 6.5 (ref 5.0–8.0)

## 2022-05-03 LAB — WET PREP, GENITAL
Clue Cells Wet Prep HPF POC: NONE SEEN
Sperm: NONE SEEN
Trich, Wet Prep: NONE SEEN
WBC, Wet Prep HPF POC: <10 (ref <10)
Yeast Wet Prep HPF POC: NONE SEEN

## 2022-05-03 NOTE — MAU Note (Signed)
.  Sharon Ward is a 26 y.o. at [redacted]w[redacted]d here in MAU reporting: arrived via EMS reporting that she was having some intermittent "menstrual-like cramping" and pressure 6/10 that started three hours ago. Reports that she has had five loose stools over the past 24 hours and that she currently feels like she needs to have a bowel movement but can't. Denies VB or LOF. Reports good FM.  LMP: N/A Onset of complaint: today Pain score: 6/10 Vitals:   05/03/22 0218  BP: (!) 125/52  Pulse: 96  Resp: 17  Temp: 97.7 F (36.5 C)  SpO2: 100%     FHT:121 Lab orders placed from triage:  UA

## 2022-05-03 NOTE — MAU Provider Note (Signed)
History     CSN: [id number]  Arrival date and time: 05/03/22 [id number]   Event Date/Time   First Provider Initiated Contact with Patient 05/03/22 0232      Chief Complaint  Patient presents with  . Abdominal Pain   Sharon Ward is a 26 y.o. G3P1011 at [redacted]w[redacted]d who receives care at [facility].  She presents today for Contractions.  Patient states she started having lower abdominal pressure and contractions that she describes as cramping that is intermittent. She states the pain lasts ~15 seconds and has no relieving or aggravating factors.  She reports the pressure makes it feel like she has to have a bowel movement. She denies current pain, but reports that when present she would rate it a 4-5/10. She denies vaginal bleeding or leaking, but reports some discharge that has been present since tonight that is odorless and white. She endorses fetal movement.   OB History     Gravida  3   Para  1   Term  1   Preterm      AB  1   Living  1      SAB  1   IAB      Ectopic      Multiple  0   Live Births  1           Past Medical History:  Diagnosis Date  . Asthma   . Bipolar 1 disorder, mixed ([place])   . Depression   . Generalized anxiety disorder   . Relationship dysfunction     Past Surgical History:  Procedure Laterality Date  . PILONIDAL CYST / SINUS EXCISION  09/11/2013  . PILONIDAL CYST EXCISION  05/17/2014   Pilonidal cystectomy with cleft lip    Family History  Problem Relation Age of Onset  . Asthma Mother   . Diabetes Mother   . Healthy Mother   . Hypertension Father   . Asthma Father   . Diabetes Father   . Healthy Father   . Asthma Brother   . Hypertension Paternal Uncle   . Diabetes Paternal Grandmother   . Stroke Paternal Grandfather   . Heart disease Paternal Grandfather   . Hypertension Paternal Grandfather   . Diabetes Paternal Grandfather     Social History   Tobacco Use  . Smoking status: Former    Packs/day: 1.00    Years: 15.00     Total pack years: 15.00    Types: Cigarettes    Quit date: 07/14/2021    Years since quitting: 0.8  . Smokeless tobacco: Never  . Tobacco comments:    only smoke a "couple" of cigarettes when stressed or anxious, socially with friends per [facility] chart  Vaping Use  . Vaping Use: Former  . Start date: 08/10/2003  . Quit date: 05/04/2021  Substance Use Topics  . Alcohol use: Not Currently    Comment: occasional prior to pregnancy  . Drug use: Not Currently    Frequency: 4.0 times per week    Types: Marijuana    Comment: No Delta 9 since February 2023    Allergies:  Allergies  Allergen Reactions  . Ascorbate Rash  . Citrus Rash  . Coconut Flavor Rash  . Lamotrigine Rash  . Orange (Diagnostic) Rash  . Peach Flavor Rash  . Pear Rash  . Pineapple Rash    Medications Prior to Admission  Medication Sig Dispense Refill Last Dose  . FLUoxetine (PROZAC) 10 MG capsule Take 1 capsule (10 mg total)  by mouth daily. 30 capsule 2 05/02/2022  . Prenatal Vit-Fe Fumarate-FA (PREPLUS) 27-1 MG TABS Take 1 tablet by mouth at bedtime.   05/02/2022  . QUETIAPINE FUMARATE PO Take 75 mg by mouth.   05/02/2022  . Blood Pressure Monitoring DEVI 1 each by Does not apply route once a week. 1 each 0     Review of Systems  Eyes:  Negative for visual disturbance.  Gastrointestinal:  Positive for abdominal pain (Cramping/Pressure), diarrhea (Watery x 1) and nausea. Negative for constipation and vomiting.  Genitourinary:  Positive for vaginal discharge. Negative for difficulty urinating, dysuria and vaginal bleeding (No odor, white).  Neurological:  Negative for dizziness, light-headedness and headaches.   Physical Exam   Blood pressure 132/64, pulse 87, temperature 97.7 F (36.5 C), temperature source Oral, resp. rate 17, height 5\' 3"  (1.6 m), weight 112 kg, last menstrual period 08/16/2021, SpO2 97 %.  Physical Exam Vitals reviewed. Exam conducted with a chaperone present.  Constitutional:       Appearance: Normal appearance. She is well-developed.  HENT:     Head: Normocephalic and atraumatic.  Eyes:     Conjunctiva/sclera: Conjunctivae normal.  Cardiovascular:     Rate and Rhythm: Normal rate.  Pulmonary:     Effort: Pulmonary effort is normal. No respiratory distress.  Abdominal:     Tenderness: There is abdominal tenderness in the left lower quadrant.  Genitourinary:    Comments: NEFG. No apparent discharge at introitus. Wet Prep collected blindly Dilation: Closed Effacement (%): 50 Station: Ballotable Exam by:: Gavin Pound CNM  Musculoskeletal:     Cervical back: Normal range of motion.  Skin:    General: Skin is warm and dry.  Neurological:     Mental Status: She is alert and oriented to person, place, and time.  Psychiatric:        Mood and Affect: Mood normal.        Behavior: Behavior normal.    Fetal Assessment 130 bpm, Mod Var, -Decels, +Accels Toco: No ctx graphed  MAU Course   Results for orders placed or performed during the hospital encounter of 05/03/22 (from the past 24 hour(s))  Urinalysis, Routine w reflex microscopic     Status: None   Collection Time: 05/03/22  2:25 AM  Result Value Ref Range   Color, Urine YELLOW YELLOW   APPearance CLEAR CLEAR   Specific Gravity, Urine 1.010 1.005 - 1.030   pH 6.5 5.0 - 8.0   Glucose, UA NEGATIVE NEGATIVE mg/dL   Hgb urine dipstick NEGATIVE NEGATIVE   Bilirubin Urine NEGATIVE NEGATIVE   Ketones, ur NEGATIVE NEGATIVE mg/dL   Protein, ur NEGATIVE NEGATIVE mg/dL   Nitrite NEGATIVE NEGATIVE   Leukocytes,Ua NEGATIVE NEGATIVE  Wet prep, genital     Status: None   Collection Time: 05/03/22  2:47 AM   Specimen: Vaginal  Result Value Ref Range   Yeast Wet Prep HPF POC NONE SEEN NONE SEEN   Trich, Wet Prep NONE SEEN NONE SEEN   Clue Cells Wet Prep HPF POC NONE SEEN NONE SEEN   WBC, Wet Prep HPF POC <10 <10   Sperm NONE SEEN    No results found.  MDM PE Labs: UA, Wet Prep EFM Assessment and Plan   26 year old G3P1011  SIUP at 35.5 weeks Cat I FT Pelvic Pressure Abdominal Tenderness  -POC Reviewed. -Discussed cervical exam and wet prep d/t complaint of vaginal discharge. -Exam performed and findings discussed. -Wet prep collected. -Reassured that cervix closed. -  Patient offered and declines pain medication. -Patient agreeable to heat application.  -NST reassuring, but fetal monitoring with inconsistent tracing d/t fetal movement/position. Readjust and continue to monitor. -Await results.   Maryann Conners MSN, CNM 05/03/2022, 2:32 AM   Reassessment (3:11 AM)  -Results as above. -Patient informed of findings. -Discussed continued monitoring at home. -Keep next appt as scheduled. -NST reactive.  -Encouraged to call primary office or return to MAU if symptoms worsen or with the onset of new symptoms. -Discharged to home in stable condition.  Maryann Conners MSN, CNM Advanced Practice Provider, [facility]

## 2022-05-04 ENCOUNTER — Encounter: Payer: Self-pay | Admitting: Certified Nurse Midwife

## 2022-05-05 ENCOUNTER — Other Ambulatory Visit: Payer: Self-pay

## 2022-05-05 ENCOUNTER — Ambulatory Visit (INDEPENDENT_AMBULATORY_CARE_PROVIDER_SITE_OTHER): Payer: Medicaid Other | Admitting: Certified Nurse Midwife

## 2022-05-05 VITALS — BP 130/80 | HR 79 | Wt 244.3 lb

## 2022-05-05 DIAGNOSIS — Z3493 Encounter for supervision of normal pregnancy, unspecified, third trimester: Secondary | ICD-10-CM

## 2022-05-05 DIAGNOSIS — F319 Bipolar disorder, unspecified: Secondary | ICD-10-CM

## 2022-05-05 DIAGNOSIS — R8271 Bacteriuria: Secondary | ICD-10-CM

## 2022-05-05 DIAGNOSIS — Z3A36 36 weeks gestation of pregnancy: Secondary | ICD-10-CM

## 2022-05-06 NOTE — Progress Notes (Signed)
   PRENATAL VISIT NOTE  Subjective:  Sharon Ward is a 26 y.o. G3P1011 at [redacted]w[redacted]d being seen today for ongoing prenatal care.  She is currently monitored for the following issues for this high-risk pregnancy and has LGSIL on Pap smear of cervix; Oppositional defiant disorder; Pilonidal disease; Prolonged QT interval; Marijuana abuse; Borderline personality disorder ([place]); PTSD (post-traumatic stress disorder); GAD (generalized anxiety disorder); Supervision of other normal pregnancy, antepartum; GBS bacteriuria; Bipolar disorder, rapid cycling ([place]); Chronic constipation; Gastroesophageal reflux disease without esophagitis; History of dysphagia; Mild intermittent asthma without complication; Yeast vaginitis; and Echogenic intracardiac focus of fetus on prenatal ultrasound on their problem list.  Patient reports no complaints.  Contractions: Irritability. Vag. Bleeding: None.  Movement: Present. Denies leaking of fluid.   The following portions of the patient's history were reviewed and updated as appropriate: allergies, current medications, past family history, past medical history, past social history, past surgical history and problem list.   Objective:   Vitals:   05/05/22 1459  BP: 130/80  Pulse: 79  Weight: 244 lb 4.8 oz (110.8 kg)   Fetal Status: Fetal Heart Rate (bpm): 140 Fundal Height: 38 cm Movement: Present  Presentation: Vertex  General:  Alert, oriented and cooperative. Patient is in no acute distress.  Skin: Skin is warm and dry. No rash noted.   Cardiovascular: Normal heart rate noted  Respiratory: Normal respiratory effort, no problems with respiration noted  Abdomen: Soft, gravid, appropriate for gestational age.  Pain/Pressure: Present     Pelvic: Cervical exam deferred        Extremities: Normal range of motion.  Edema: None  Mental Status: Normal mood and affect. Normal behavior. Normal judgment and thought content.   Assessment and Plan:  Pregnancy: G3P1011 at  [redacted]w[redacted]d 1. Encounter for supervision of low-risk pregnancy in third trimester - Doing well, feeling regular and vigorous fetal movement  - Was interested in waterbirth and has taken the class, meets criteria but now does not think she will want to given how quickly she birthed last time  2. [redacted] weeks gestation of pregnancy - Routine OB care  - Culture, beta strep (group b only)  3. GBS bacteriuria - Will treat in labor  4. Bipolar disorder, rapid cycling ([place]) - Stable on meds  Preterm labor symptoms and general obstetric precautions including but not limited to vaginal bleeding, contractions, leaking of fluid and fetal movement were reviewed in detail with the patient. Please refer to After Visit Summary for other counseling recommendations.   Return in about 1 week (around 05/12/2022) for IN-PERSON, LOB.  Future Appointments  Date Time Provider [place]  05/13/2022  1:55 PM [facility]  05/19/2022  2:55 PM Gabriel Carina, CNM [facility]  05/26/2022  2:55 PM Gabriel Carina, CNM [facility]  06/02/2022  2:55 PM Gabriel Carina, CNM [facility]    Gabriel Carina, CNM

## 2022-05-08 LAB — CULTURE, BETA STREP (GROUP B ONLY): Strep Gp B Culture: POSITIVE — AB

## 2022-05-08 NOTE — Progress Notes (Unsigned)
   PRENATAL VISIT NOTE  Subjective:  Sharon Ward is a 26 y.o. G3P1011 at [redacted]w[redacted]d being seen today for ongoing prenatal care.  She is currently monitored for the following issues for this {Blank single:19197::"high-risk","low-risk"} pregnancy and has LGSIL on Pap smear of cervix; Oppositional defiant disorder; Pilonidal disease; Prolonged QT interval; Marijuana abuse; Borderline personality disorder ([place]); PTSD (post-traumatic stress disorder); GAD (generalized anxiety disorder); Supervision of other normal pregnancy, antepartum; GBS bacteriuria; Bipolar disorder, rapid cycling ([place]); Chronic constipation; Gastroesophageal reflux disease without esophagitis; History of dysphagia; Mild intermittent asthma without complication; Yeast vaginitis; and Echogenic intracardiac focus of fetus on prenatal ultrasound on their problem list.  Patient reports {[id number]}.   .  .   . Denies leaking of fluid.   The following portions of the patient's history were reviewed and updated as appropriate: allergies, current medications, past family history, past medical history, past social history, past surgical history and problem list.   Objective:  There were no vitals filed for this visit.  Fetal Status:           General:  Alert, oriented and cooperative. Patient is in no acute distress.  Skin: Skin is warm and dry. No rash noted.   Cardiovascular: Normal heart rate noted  Respiratory: Normal respiratory effort, no problems with respiration noted  Abdomen: Soft, gravid, appropriate for gestational age.        Pelvic: {Blank single:19197::"Cervical exam performed in the presence of a chaperone","Cervical exam deferred"}        Extremities: Normal range of motion.     Mental Status: Normal mood and affect. Normal behavior. Normal judgment and thought content.   Assessment and Plan:  Pregnancy: G3P1011 at [redacted]w[redacted]d There are no diagnoses linked to this encounter. {Blank single:19197::"Term","Preterm"} labor  symptoms and general obstetric precautions including but not limited to vaginal bleeding, contractions, leaking of fluid and fetal movement were reviewed in detail with the patient. Please refer to After Visit Summary for other counseling recommendations.   No follow-ups on file.  Future Appointments  Date Time Provider [place]  05/13/2022  1:55 PM [facility]  05/19/2022  2:55 PM Gabriel Carina, CNM [facility]  05/26/2022  2:55 PM Gabriel Carina, CNM [facility]  06/02/2022  2:55 PM Gabriel Carina, CNM [facility]    [place], [place]

## 2022-05-10 ENCOUNTER — Encounter: Payer: Self-pay | Admitting: Student

## 2022-05-11 ENCOUNTER — Encounter: Payer: Self-pay | Admitting: Certified Nurse Midwife

## 2022-05-13 ENCOUNTER — Ambulatory Visit (INDEPENDENT_AMBULATORY_CARE_PROVIDER_SITE_OTHER): Payer: Medicaid Other | Admitting: Student

## 2022-05-13 ENCOUNTER — Encounter: Payer: Self-pay | Admitting: Student

## 2022-05-13 ENCOUNTER — Other Ambulatory Visit: Payer: Self-pay

## 2022-05-13 VITALS — BP 129/63 | HR 82 | Wt 250.3 lb

## 2022-05-13 DIAGNOSIS — Z3480 Encounter for supervision of other normal pregnancy, unspecified trimester: Secondary | ICD-10-CM

## 2022-05-13 DIAGNOSIS — Z3A37 37 weeks gestation of pregnancy: Secondary | ICD-10-CM

## 2022-05-14 NOTE — Telephone Encounter (Signed)
Called pt to address concerns. Pt is concerned that her visit yesterday was rushed and not thorough. Apologized to patient that this occurred. Talked at length regarding routine cervical cancer screening through PAP smears and reviewed results from [facility] on 05/24/2019. Explained we recommend another PAP test at postpartum visit for follow up. Pt reports regular contractions 1-5 minutes apart for 2 hours last night. Pt states these subsided. Reviewed labor precautions with patient. Pt is concerned that cervical exam was not thorough so she will not know when to go to the hospital for evaluation. Reviewed that our recommendation for when to be seen does not change based on cervical dilation.

## 2022-05-15 ENCOUNTER — Telehealth: Payer: Self-pay | Admitting: Student

## 2022-05-15 NOTE — Telephone Encounter (Signed)
TC to patient to inquire if she had any more concerns about her visit; no answer and left VM stating that I was calling to follow up on her visit and answer any other questions she may have. Will try to call back again this afternoon.   Maye Hides

## 2022-05-15 NOTE — Telephone Encounter (Signed)
TC to patient to discuss her concerns about her lab results and last OB visit. VM answered and I did not leave a message.   KK

## 2022-05-19 ENCOUNTER — Ambulatory Visit (INDEPENDENT_AMBULATORY_CARE_PROVIDER_SITE_OTHER): Payer: Medicaid Other | Admitting: Certified Nurse Midwife

## 2022-05-19 VITALS — BP 125/79 | HR 82 | Wt 254.7 lb

## 2022-05-19 DIAGNOSIS — R8271 Bacteriuria: Secondary | ICD-10-CM

## 2022-05-19 DIAGNOSIS — Z3493 Encounter for supervision of normal pregnancy, unspecified, third trimester: Secondary | ICD-10-CM

## 2022-05-19 DIAGNOSIS — Z3A38 38 weeks gestation of pregnancy: Secondary | ICD-10-CM

## 2022-05-20 NOTE — Progress Notes (Signed)
   PRENATAL VISIT NOTE  Subjective:  Sharon Ward is a 26 y.o. G3P1011 at [redacted]w[redacted]d being seen today for ongoing prenatal care.  She is currently monitored for the following issues for this high-risk pregnancy and has LGSIL on Pap smear of cervix; Oppositional defiant disorder; Pilonidal disease; Prolonged QT interval; Marijuana abuse; Borderline personality disorder ([place]); PTSD (post-traumatic stress disorder); GAD (generalized anxiety disorder); Supervision of other normal pregnancy, antepartum; GBS bacteriuria; Bipolar disorder, rapid cycling ([place]); Chronic constipation; Gastroesophageal reflux disease without esophagitis; History of dysphagia; Mild intermittent asthma without complication; Yeast vaginitis; and Echogenic intracardiac focus of fetus on prenatal ultrasound on their problem list.  Patient reports  left foot is swelling larger than the right, but has been told she has a fracture in that foot that needs surgical correction. Having a lot of pelvic pressure and wants a cervical exam .  Contractions: Irregular. Vag. Bleeding: None.  Movement: Present. Denies leaking of fluid.   The following portions of the patient's history were reviewed and updated as appropriate: allergies, current medications, past family history, past medical history, past social history, past surgical history and problem list.   Objective:   Vitals:   05/19/22 1501  BP: 125/79  Pulse: 82  Weight: 254 lb 11.2 oz (115.5 kg)   Fetal Status: Fetal Heart Rate (bpm): 133 Fundal Height: 40 cm Movement: Present  Presentation: Vertex  General:  Alert, oriented and cooperative. Patient is in no acute distress.  Skin: Skin is warm and dry. No rash noted.   Cardiovascular: Normal heart rate noted  Respiratory: Normal respiratory effort, no problems with respiration noted  Abdomen: Soft, gravid, appropriate for gestational age.  Pain/Pressure: Present     Pelvic: Cervical exam deferred        Extremities: Normal range of  motion.  Edema: Trace  Mental Status: Normal mood and affect. Normal behavior. Normal judgment and thought content.   Assessment and Plan:  Pregnancy: G3P1011 at [redacted]w[redacted]d 1. Encounter for supervision of low-risk pregnancy in third trimester - Doing well overall, feeling regular and vigorous fetal movement   2. [redacted] weeks gestation of pregnancy - Routine OB care  - Discussed IOL if needed, pt prefers to wait until next week and schedule at that appt if needed. Unsure if she wants to wait until 40 or 41 weeks.   3. GBS bacteriuria - Will treat in labor with PCN  Term labor symptoms and general obstetric precautions including but not limited to vaginal bleeding, contractions, leaking of fluid and fetal movement were reviewed in detail with the patient. Please refer to After Visit Summary for other counseling recommendations.   Return in about 1 week (around 05/26/2022) for IN-PERSON, LOB.  Future Appointments  Date Time Provider [place]  05/26/2022  2:55 PM Helaine Chess [facility]  06/02/2022  2:55 PM Gabriel Carina, CNM [facility]   Gabriel Carina, CNM

## 2022-05-23 ENCOUNTER — Encounter (HOSPITAL_COMMUNITY): Payer: Self-pay | Admitting: Obstetrics and Gynecology

## 2022-05-23 ENCOUNTER — Inpatient Hospital Stay (HOSPITAL_COMMUNITY)
Admission: AD | Admit: 2022-05-23 | Discharge: 2022-05-24 | Disposition: A | Discharge status: Home / Self Care | Payer: Medicaid Other | Attending: Obstetrics and Gynecology | Admitting: Obstetrics and Gynecology

## 2022-05-23 DIAGNOSIS — O471 False labor at or after 37 completed weeks of gestation: Secondary | ICD-10-CM | POA: Insufficient documentation

## 2022-05-23 DIAGNOSIS — Z3A38 38 weeks gestation of pregnancy: Secondary | ICD-10-CM | POA: Insufficient documentation

## 2022-05-23 DIAGNOSIS — O479 False labor, unspecified: Secondary | ICD-10-CM

## 2022-05-23 DIAGNOSIS — R030 Elevated blood-pressure reading, without diagnosis of hypertension: Secondary | ICD-10-CM

## 2022-05-23 DIAGNOSIS — Z3493 Encounter for supervision of normal pregnancy, unspecified, third trimester: Secondary | ICD-10-CM

## 2022-05-23 LAB — POCT FERN TEST
POCT Fern Test: NEGATIVE
POCT Fern Test: NEGATIVE

## 2022-05-23 NOTE — MAU Note (Signed)
.  Sharon Ward is a 26 y.o. at [redacted]w[redacted]d here in MAU reporting: gush of fluid at 2130, reports she has been having a lot of back pain and pressure. Was seen on Thursday and SVE was 2cm. Reports positive fetal movement.   Onset of complaint: 2130 Pain score: 8/10 Vitals:   05/23/22 2300  BP: (!) 147/52  Pulse: 81  Resp: 19  Temp: 97.8 F (36.6 C)  SpO2: 97%     FHT:154 Lab orders placed from triage:

## 2022-05-23 NOTE — MAU Provider Note (Signed)
History     CSN: [id number]  Arrival date and time: 05/23/22 2253   None     Chief Complaint  Patient presents with   Contractions   Rupture of Membranes   HPI Sharon Ward is a 26 y.o. G3P1011 at [redacted]w[redacted]d who presents to MAU via EMS with reports of contractions and leaking fluid. Patient reports ongoing low back pain and abdominal cramping. Around 9pm went to the bathroom and "felt a pop". Started "peeing a lot". Noticed a lot of clear watery discharge. She continued to have several episodes where she felt watery discharge come out. Reports increased frequency in urination, going to bathroom every 15 minutes or so. She is not wearing a pad. She denies vaginal bleeding, itching, odor, or urinary s/s. Endorses active fetal movement.  Patient receives prenatal care at [facility], next appointment is scheduled on 10/18.  OB History     Gravida  3   Para  1   Term  1   Preterm      AB  1   Living  1      SAB  1   IAB      Ectopic      Multiple  0   Live Births  1           Past Medical History:  Diagnosis Date   Asthma    Bipolar 1 disorder, mixed ([place])    Depression    Generalized anxiety disorder    Intentional drug overdose ([place]) 11/03/2020   Low-lying placenta 01/07/2022   Resolved 03/04/22   Relationship dysfunction    Suicide attempt ([place]) 11/02/2020    Past Surgical History:  Procedure Laterality Date   PILONIDAL CYST / SINUS EXCISION  09/11/2013   PILONIDAL CYST EXCISION  05/17/2014   Pilonidal cystectomy with cleft lip    Family History  Problem Relation Age of Onset   Asthma Mother    Diabetes Mother    Healthy Mother    Hypertension Father    Asthma Father    Diabetes Father    Healthy Father    Asthma Brother    Hypertension Paternal Uncle    Diabetes Paternal Grandmother    Stroke Paternal Grandfather    Heart disease Paternal Grandfather    Hypertension Paternal Grandfather    Diabetes Paternal Grandfather     Social History    Tobacco Use   Smoking status: Former    Packs/day: 1.00    Years: 15.00    Total pack years: 15.00    Types: Cigarettes    Quit date: 07/14/2021    Years since quitting: 0.8   Smokeless tobacco: Never   Tobacco comments:    only smoke a "couple" of cigarettes when stressed or anxious, socially with friends per [facility] chart  Vaping Use   Vaping Use: Former   Start date: 08/10/2003   Quit date: 05/04/2021  Substance Use Topics   Alcohol use: Not Currently    Comment: occasional prior to pregnancy   Drug use: Not Currently    Frequency: 4.0 times per week    Types: Marijuana    Comment: No Delta 9 since February 2023    Allergies:  Allergies  Allergen Reactions   Ascorbate Rash   Citrus Rash   Coconut Flavor Rash   Lamotrigine Rash   Orange (Diagnostic) Rash   Peach Flavor Rash   Pear Rash   Pineapple Rash    Medications Prior to Admission  Medication Sig Dispense Refill  Last Dose   FLUoxetine (PROZAC) 10 MG capsule Take 1 capsule (10 mg total) by mouth daily. 30 capsule 2 05/22/2022   Prenatal Vit-Fe Fumarate-FA (PREPLUS) 27-1 MG TABS Take 1 tablet by mouth at bedtime.   05/22/2022   QUETIAPINE FUMARATE PO Take 75 mg by mouth.   05/22/2022   Blood Pressure Monitoring DEVI 1 each by Does not apply route once a week. 1 each 0    Review of Systems  Constitutional: Negative.   Respiratory: Negative.    Cardiovascular: Negative.   Gastrointestinal:  Positive for abdominal pain (cramping).  Genitourinary:  Positive for frequency and vaginal discharge. Negative for dysuria and vaginal bleeding.  Musculoskeletal:  Positive for back pain.  Neurological: Negative.    Physical Exam   Patient Vitals for the past 24 hrs:  BP Temp Pulse Resp SpO2 Height Weight  05/24/22 0131 129/69 -- 84 -- -- -- --  05/24/22 0130 -- -- -- -- 97 % -- --  05/24/22 0125 -- -- -- -- 97 % -- --  05/24/22 0120 -- -- -- -- 97 % -- --  05/24/22 0115 (!) 136/54 -- 74 -- 97 % -- --  05/24/22 0113  (!) 141/68 -- 73 -- -- -- --  05/24/22 0110 -- -- -- -- 97 % -- --  05/24/22 0105 -- -- -- -- 97 % -- --  05/24/22 0100 -- -- -- -- 98 % -- --  05/24/22 0055 -- -- -- -- 98 % -- --  05/24/22 0050 -- -- -- -- 98 % -- --  05/24/22 0046 136/74 -- 77 -- -- -- --  05/24/22 0045 -- -- -- -- 98 % -- --  05/24/22 0040 -- -- -- -- 98 % -- --  05/24/22 0035 -- -- -- -- 98 % -- --  05/24/22 0030 122/69 -- 83 -- 98 % -- --  05/24/22 0025 -- -- -- -- 98 % -- --  05/24/22 0020 -- -- -- -- 98 % -- --  05/24/22 0015 (!) 123/56 -- 94 -- 98 % -- --  05/24/22 0001 (!) 148/68 -- 76 -- -- -- --  05/24/22 0000 -- -- -- -- 97 % -- --  05/23/22 2355 -- -- -- -- 96 % -- --  05/23/22 2350 -- -- -- -- 96 % -- --  05/23/22 2346 130/64 -- 82 -- -- -- --  05/23/22 2345 -- -- -- -- 98 % -- --  05/23/22 2340 -- -- -- -- 99 % -- --  05/23/22 2335 -- -- -- -- 95 % -- --  05/23/22 2331 127/60 -- 80 -- -- -- --  05/23/22 2330 -- -- -- -- 96 % -- --  05/23/22 2325 -- -- -- -- 97 % -- --  05/23/22 2320 -- -- -- -- 96 % -- --  05/23/22 2315 136/70 -- 84 -- 96 % -- --  05/23/22 2310 125/62 -- 76 -- 96 % -- --  05/23/22 2305 -- -- -- -- 97 % -- --  05/23/22 2300 (!) 147/52 97.8 F (36.6 C) 81 19 97 % 5\' 3"  (1.6 m) 115 kg   Physical Exam Vitals and nursing note reviewed. Exam conducted with a chaperone present.  Constitutional:      General: She is not in acute distress.    Appearance: She is obese.  Eyes:     Extraocular Movements: Extraocular movements intact.     Pupils: Pupils are equal, round, and reactive to light.  Cardiovascular:     Rate and Rhythm: Normal rate.  Pulmonary:     Effort: Pulmonary effort is normal.  Abdominal:     Palpations: Abdomen is soft.     Tenderness: There is no abdominal tenderness.     Comments: Gravid   Genitourinary:    Comments: Normal external female genitalia, vaginal walls pink with rugae, small amount of thin white discharge, no pooling of amniotic fluid, no bleeding,  cervix visually closed Musculoskeletal:        General: Normal range of motion.     Cervical back: Normal range of motion.  Skin:    General: Skin is warm and dry.  Neurological:     General: No focal deficit present.     Mental Status: She is alert and oriented to person, place, and time.  Psychiatric:        Mood and Affect: Mood normal.        Behavior: Behavior normal.   Dilation: Closed Effacement (%): Thick Exam by:: d. Alessandria Henken,cnm  NST FHR: 125 bpm, moderate variability, +15x15 accels, no decels Toco: occasional   MAU Course  Procedures  MDM UA SSE and fern slide PEC labs  UA negative, SSE without pooling of amniotic fluid, fern slide negative. Discharge appears physiologic in nature. NST reactive and reassuring. Cervix closed/thick.  Patient had initial elevated BP, followed by normal BP's. She ultimately had another mild range BP so PEC labs ordered. She is asymptomatic. She has no history of elevated BP's. Remaining BP's normotensive.   PEC labs wnl. Patient remains asymptomatic. At this time she does not have PEC nor meet criteria for gHTN. Membranes are intact. She is not in active labor. She has an appointment on 10/18. I reviewed strict return precautions with patient.   Assessment and Plan  [redacted] weeks gestation of pregnancy Intact amniotic membranes Braxton hicks contractions Elevated BP without diagnosis of hypertension  - Discharge home in stable condition - Strict return precautions. Return to MAU as needed for new/worsening symptoms - Keep appointment as scheduled on 10/18   Brand Males, CNM 05/24/2022, 1:53 AM

## 2022-05-24 ENCOUNTER — Telehealth: Payer: Self-pay | Admitting: Family Medicine

## 2022-05-24 ENCOUNTER — Encounter: Payer: Self-pay | Admitting: Certified Nurse Midwife

## 2022-05-24 ENCOUNTER — Ambulatory Visit (INDEPENDENT_AMBULATORY_CARE_PROVIDER_SITE_OTHER): Payer: Medicaid Other | Admitting: *Deleted

## 2022-05-24 VITALS — BP 125/86 | HR 81 | Ht 63.0 in | Wt 250.3 lb

## 2022-05-24 DIAGNOSIS — Z3A38 38 weeks gestation of pregnancy: Secondary | ICD-10-CM

## 2022-05-24 DIAGNOSIS — O479 False labor, unspecified: Secondary | ICD-10-CM

## 2022-05-24 DIAGNOSIS — Z0130 Encounter for examination of blood pressure without abnormal findings: Secondary | ICD-10-CM

## 2022-05-24 DIAGNOSIS — Z3493 Encounter for supervision of normal pregnancy, unspecified, third trimester: Secondary | ICD-10-CM

## 2022-05-24 DIAGNOSIS — O471 False labor at or after 37 completed weeks of gestation: Secondary | ICD-10-CM | POA: Diagnosis not present

## 2022-05-24 LAB — COMPREHENSIVE METABOLIC PANEL
ALT: 14 U/L (ref 0–44)
AST: 19 U/L (ref 15–41)
Albumin: 2.9 g/dL — ABNORMAL LOW (ref 3.5–5.0)
Alkaline Phosphatase: 137 U/L — ABNORMAL HIGH (ref 38–126)
Anion gap: 9 (ref 5–15)
BUN: <5 mg/dL — ABNORMAL LOW (ref 6–20)
CO2: 20 mmol/L — ABNORMAL LOW (ref 22–32)
Calcium: 9 mg/dL (ref 8.9–10.3)
Chloride: 107 mmol/L (ref 98–111)
Creatinine, Ser: 0.48 mg/dL (ref 0.44–1.00)
GFR, Estimated: >60 mL/min (ref >60)
Glucose, Bld: 107 mg/dL — ABNORMAL HIGH (ref 70–99)
Potassium: 3.5 mmol/L (ref 3.5–5.1)
Sodium: 136 mmol/L (ref 135–145)
Total Bilirubin: 0.3 mg/dL (ref 0.3–1.2)
Total Protein: 6.1 g/dL — ABNORMAL LOW (ref 6.5–8.1)

## 2022-05-24 LAB — URINALYSIS, ROUTINE W REFLEX MICROSCOPIC
Bilirubin Urine: NEGATIVE
Glucose, UA: NEGATIVE mg/dL
Hgb urine dipstick: NEGATIVE
Ketones, ur: NEGATIVE mg/dL
Leukocytes,Ua: NEGATIVE
Nitrite: NEGATIVE
Protein, ur: NEGATIVE mg/dL
Specific Gravity, Urine: 1.008 (ref 1.005–1.030)
pH: 6 (ref 5.0–8.0)

## 2022-05-24 LAB — CBC
HCT: 39.3 % (ref 36.0–46.0)
Hemoglobin: 13.5 g/dL (ref 12.0–15.0)
MCH: 30.9 pg (ref 26.0–34.0)
MCHC: 34.4 g/dL (ref 30.0–36.0)
MCV: 89.9 fL (ref 80.0–100.0)
Platelets: 187 10*3/uL (ref 150–400)
RBC: 4.37 MIL/uL (ref 3.87–5.11)
RDW: 14 % (ref 11.5–15.5)
WBC: 8.6 10*3/uL (ref 4.0–10.5)
nRBC: 0 % (ref 0.0–0.2)

## 2022-05-24 LAB — PROTEIN / CREATININE RATIO, URINE
Creatinine, Urine: 48 mg/dL
Protein Creatinine Ratio: 0.13 mg/mg{Cre} (ref 0.00–0.15)
Total Protein, Urine: 6 mg/dL

## 2022-05-24 NOTE — Telephone Encounter (Signed)
Called pt in response to her Mychart message and discussed her concerns regarding high blood pressure. She reports 160/80 and 156/177. Pt also reports feeling dizzy and out of breath. Per chart review, pt was seen @ hospital last night and had normal pre-e labs. Pt reports several elevated BP readings today and is worried. Pt was offered nurse visit today for BP check and she accepted. Pt was asked to bring her BP cuff to the appt. She voiced understanding of instructions given.

## 2022-05-24 NOTE — Telephone Encounter (Signed)
Addressed via MyChart message

## 2022-05-24 NOTE — Progress Notes (Signed)
Here for blood pressure today. Had went to MAU yesterday for contractions and check for ROM which was negative. Had some elevated bp 136/53 and 141/68 in MAU , was discharged. Then today since she got up at noon she feels SOB , dizzy and BP 16080, 156/77. Thru Mychart messages was advised to come to office for bp check. BP with office machine= 123/80. Oxygen saturation 99%. BP with her machine 125/86. FHR 140. Trace edema noted.  Denies any other issues. Reviewed Vital signs and assessment with Dr. Elgie Congo. Instructed to have patient to keep ob follow up already scheduled for 05/26/22. Instructed patient to keep ob appointment as scheduled and to report to hospital for worsening symptoms including severe headache, worsening edema, elevated blood pressure. She voices understanding. Staci Acosta

## 2022-05-24 NOTE — Telephone Encounter (Signed)
This patient would like to speak to a nurse about her current high blood pressure and other concerns

## 2022-05-26 ENCOUNTER — Other Ambulatory Visit: Payer: Self-pay

## 2022-05-26 ENCOUNTER — Ambulatory Visit (INDEPENDENT_AMBULATORY_CARE_PROVIDER_SITE_OTHER): Payer: Medicaid Other | Admitting: Certified Nurse Midwife

## 2022-05-26 VITALS — BP 121/58 | HR 76 | Wt 253.3 lb

## 2022-05-26 DIAGNOSIS — Z3493 Encounter for supervision of normal pregnancy, unspecified, third trimester: Secondary | ICD-10-CM

## 2022-05-26 DIAGNOSIS — Z22330 Carrier of Group B streptococcus: Secondary | ICD-10-CM

## 2022-05-26 DIAGNOSIS — Z3A39 39 weeks gestation of pregnancy: Secondary | ICD-10-CM

## 2022-05-27 ENCOUNTER — Encounter (HOSPITAL_COMMUNITY): Payer: Self-pay

## 2022-05-27 ENCOUNTER — Encounter: Payer: Self-pay | Admitting: Certified Nurse Midwife

## 2022-05-27 ENCOUNTER — Telehealth (HOSPITAL_COMMUNITY): Payer: Self-pay | Admitting: *Deleted

## 2022-05-27 ENCOUNTER — Encounter (HOSPITAL_COMMUNITY): Payer: Self-pay | Admitting: *Deleted

## 2022-05-27 NOTE — Telephone Encounter (Signed)
Preadmission screen  

## 2022-05-27 NOTE — Progress Notes (Signed)
   PRENATAL VISIT NOTE  Subjective:  Sharon Ward is a 26 y.o. G3P1011 at [redacted]w[redacted]d being seen today for ongoing prenatal care.  She is currently monitored for the following issues for this low-risk pregnancy and has LGSIL on Pap smear of cervix; Oppositional defiant disorder; Pilonidal disease; Prolonged QT interval; Marijuana abuse; Borderline personality disorder ([place]); PTSD (post-traumatic stress disorder); GAD (generalized anxiety disorder); Supervision of other normal pregnancy, antepartum; GBS bacteriuria; Bipolar disorder, rapid cycling ([place]); Chronic constipation; Gastroesophageal reflux disease without esophagitis; History of dysphagia; Mild intermittent asthma without complication; Yeast vaginitis; and Echogenic intracardiac focus of fetus on prenatal ultrasound on their problem list.  Patient reports  pelvis hurts, low back hurts, has pelvic pain that stops her from taking steps at time (but then passes), having trouble sleeping, just overall very uncomfortable and wants to discuss induction of labor .  Contractions: Irregular. Vag. Bleeding: None.  Movement: Present. Denies leaking of fluid.   The following portions of the patient's history were reviewed and updated as appropriate: allergies, current medications, past family history, past medical history, past social history, past surgical history and problem list.   Objective:   Vitals:   05/26/22 1530  BP: (!) 121/58  Pulse: 76  Weight: 253 lb 4.8 oz (114.9 kg)   Fetal Status: Fetal Heart Rate (bpm): 136 Fundal Height: 40 cm Movement: Present  Presentation: Vertex  General:  Alert, oriented and cooperative. Patient is in no acute distress.  Skin: Skin is warm and dry. No rash noted.   Cardiovascular: Normal heart rate noted  Respiratory: Normal respiratory effort, no problems with respiration noted  Abdomen: Soft, gravid, appropriate for gestational age.  Pain/Pressure: Present     Pelvic: Cervical exam deferred         Extremities: Normal range of motion.  Edema: None  Mental Status: Normal mood and affect. Normal behavior. Normal judgment and thought content.   Assessment and Plan:  Pregnancy: G3P1011 at [redacted]w[redacted]d Supervision of low risk pregnancy, third trimester - Doing well overall, reported she has not been feeling baby move as well but baby moved vigorously throughout Leopold's and Doppler  - Reviewed kick counts and fetal movement in the last days of pregnancy as well as when to report to MAU for DFM  2. [redacted] weeks gestation of pregnancy 05/29/22. - Pt requesting IOL, eIOL scheduled for first available  - Methods of IOL discussed, orders placed  3. GBS carrier - will treat with PCN in labor  Term labor symptoms and general obstetric precautions including but not limited to vaginal bleeding, contractions, leaking of fluid and fetal movement were reviewed in detail with the patient. Please refer to After Visit Summary for other counseling recommendations.   Future Appointments  Date Time Provider [place]  05/29/2022  6:45 AM [facility] None    Gabriel Carina, CNM

## 2022-05-28 ENCOUNTER — Inpatient Hospital Stay (HOSPITAL_COMMUNITY)
Admission: AD | Admit: 2022-05-28 | Discharge: 2022-05-31 | DRG: 807 | Disposition: A | Discharge status: Home / Self Care | Payer: Medicaid Other | Attending: Family Medicine | Admitting: Family Medicine

## 2022-05-28 ENCOUNTER — Encounter: Payer: Self-pay | Admitting: Certified Nurse Midwife

## 2022-05-28 ENCOUNTER — Encounter (HOSPITAL_COMMUNITY): Payer: Self-pay | Admitting: Obstetrics & Gynecology

## 2022-05-28 DIAGNOSIS — F319 Bipolar disorder, unspecified: Secondary | ICD-10-CM | POA: Diagnosis present

## 2022-05-28 DIAGNOSIS — F603 Borderline personality disorder: Secondary | ICD-10-CM | POA: Diagnosis present

## 2022-05-28 DIAGNOSIS — F4312 Post-traumatic stress disorder, chronic: Secondary | ICD-10-CM | POA: Diagnosis present

## 2022-05-28 DIAGNOSIS — O99344 Other mental disorders complicating childbirth: Secondary | ICD-10-CM | POA: Diagnosis present

## 2022-05-28 DIAGNOSIS — F411 Generalized anxiety disorder: Secondary | ICD-10-CM | POA: Diagnosis present

## 2022-05-28 DIAGNOSIS — F4310 Post-traumatic stress disorder, unspecified: Secondary | ICD-10-CM | POA: Diagnosis present

## 2022-05-28 DIAGNOSIS — Z87891 Personal history of nicotine dependence: Secondary | ICD-10-CM | POA: Diagnosis not present

## 2022-05-28 DIAGNOSIS — Z3480 Encounter for supervision of other normal pregnancy, unspecified trimester: Secondary | ICD-10-CM

## 2022-05-28 DIAGNOSIS — O134 Gestational [pregnancy-induced] hypertension without significant proteinuria, complicating childbirth: Secondary | ICD-10-CM | POA: Diagnosis present

## 2022-05-28 DIAGNOSIS — R8271 Bacteriuria: Secondary | ICD-10-CM | POA: Diagnosis present

## 2022-05-28 DIAGNOSIS — O368130 Decreased fetal movements, third trimester, not applicable or unspecified: Secondary | ICD-10-CM | POA: Diagnosis present

## 2022-05-28 DIAGNOSIS — Z3493 Encounter for supervision of normal pregnancy, unspecified, third trimester: Secondary | ICD-10-CM

## 2022-05-28 DIAGNOSIS — Z3A39 39 weeks gestation of pregnancy: Secondary | ICD-10-CM

## 2022-05-28 DIAGNOSIS — O99824 Streptococcus B carrier state complicating childbirth: Secondary | ICD-10-CM | POA: Diagnosis present

## 2022-05-28 DIAGNOSIS — O283 Abnormal ultrasonic finding on antenatal screening of mother: Secondary | ICD-10-CM | POA: Diagnosis present

## 2022-05-28 DIAGNOSIS — O99324 Drug use complicating childbirth: Secondary | ICD-10-CM | POA: Diagnosis not present

## 2022-05-28 LAB — CBC
HCT: 40.4 % (ref 36.0–46.0)
Hemoglobin: 13.8 g/dL (ref 12.0–15.0)
MCH: 30.9 pg (ref 26.0–34.0)
MCHC: 34.2 g/dL (ref 30.0–36.0)
MCV: 90.4 fL (ref 80.0–100.0)
Platelets: 198 10*3/uL (ref 150–400)
RBC: 4.47 MIL/uL (ref 3.87–5.11)
RDW: 14 % (ref 11.5–15.5)
WBC: 10 10*3/uL (ref 4.0–10.5)
nRBC: 0 % (ref 0.0–0.2)

## 2022-05-28 LAB — COMPREHENSIVE METABOLIC PANEL
ALT: 14 U/L (ref 0–44)
AST: 21 U/L (ref 15–41)
Albumin: 3.1 g/dL — ABNORMAL LOW (ref 3.5–5.0)
Alkaline Phosphatase: 151 U/L — ABNORMAL HIGH (ref 38–126)
Anion gap: 14 (ref 5–15)
BUN: 5 mg/dL — ABNORMAL LOW (ref 6–20)
CO2: 22 mmol/L (ref 22–32)
Calcium: 9.8 mg/dL (ref 8.9–10.3)
Chloride: 104 mmol/L (ref 98–111)
Creatinine, Ser: 0.55 mg/dL (ref 0.44–1.00)
GFR, Estimated: >60 mL/min (ref >60)
Glucose, Bld: 109 mg/dL — ABNORMAL HIGH (ref 70–99)
Potassium: 3.7 mmol/L (ref 3.5–5.1)
Sodium: 140 mmol/L (ref 135–145)
Total Bilirubin: 0.4 mg/dL (ref 0.3–1.2)
Total Protein: 6.3 g/dL — ABNORMAL LOW (ref 6.5–8.1)

## 2022-05-28 LAB — URINALYSIS, ROUTINE W REFLEX MICROSCOPIC
Bilirubin Urine: NEGATIVE
Glucose, UA: NEGATIVE mg/dL
Hgb urine dipstick: NEGATIVE
Ketones, ur: 5 mg/dL — AB
Nitrite: NEGATIVE
Protein, ur: NEGATIVE mg/dL
Specific Gravity, Urine: 1.008 (ref 1.005–1.030)
pH: 6 (ref 5.0–8.0)

## 2022-05-28 LAB — PROTEIN / CREATININE RATIO, URINE
Creatinine, Urine: 47 mg/dL
Protein Creatinine Ratio: 0.15 mg/mg{Cre} (ref 0.00–0.15)
Total Protein, Urine: 7 mg/dL

## 2022-05-28 LAB — TYPE AND SCREEN
ABO/RH(D): O POS
Antibody Screen: NEGATIVE

## 2022-05-28 MED ORDER — SODIUM CHLORIDE 0.9 % IV SOLN
5.0000 10*6.[IU] | Freq: Once | INTRAVENOUS | Status: AC
  Administered 2022-05-29: 5 10*6.[IU] via INTRAVENOUS
  Filled 2022-05-28: qty 5

## 2022-05-28 MED ORDER — LACTATED RINGERS IV SOLN: INTRAVENOUS | Status: DC

## 2022-05-28 MED ORDER — HYDRALAZINE HCL 20 MG/ML IJ SOLN: 10.0000 mg | INTRAMUSCULAR | Status: DC | PRN

## 2022-05-28 MED ORDER — FENTANYL CITRATE (PF) 100 MCG/2ML IJ SOLN: 50.0000 ug | INTRAMUSCULAR | Status: DC | PRN

## 2022-05-28 MED ORDER — OXYTOCIN-SODIUM CHLORIDE 30-0.9 UT/500ML-% IV SOLN
2.5000 [IU]/h | INTRAVENOUS | Status: DC
  Administered 2022-05-29: 2.5 [IU]/h via INTRAVENOUS
  Filled 2022-05-28: qty 500

## 2022-05-28 MED ORDER — OXYTOCIN BOLUS FROM INFUSION
333.0000 mL | Freq: Once | INTRAVENOUS | Status: AC
  Administered 2022-05-29: 333 mL via INTRAVENOUS

## 2022-05-28 MED ORDER — SOD CITRATE-CITRIC ACID 500-334 MG/5ML PO SOLN: 30.0000 mL | ORAL | Status: DC | PRN

## 2022-05-28 MED ORDER — PENICILLIN G POT IN DEXTROSE 60000 UNIT/ML IV SOLN
3.0000 10*6.[IU] | INTRAVENOUS | Status: DC
  Administered 2022-05-29 (×4): 3 10*6.[IU] via INTRAVENOUS
  Filled 2022-05-28 (×4): qty 50

## 2022-05-28 MED ORDER — ONDANSETRON HCL 4 MG/2ML IJ SOLN: 4.0000 mg | Freq: Four times a day (QID) | INTRAMUSCULAR | Status: DC | PRN

## 2022-05-28 MED ORDER — OXYCODONE-ACETAMINOPHEN 5-325 MG PO TABS: 1.0000 | ORAL_TABLET | ORAL | Status: DC | PRN

## 2022-05-28 MED ORDER — LIDOCAINE HCL (PF) 1 % IJ SOLN: 30.0000 mL | INTRAMUSCULAR | Status: DC | PRN

## 2022-05-28 MED ORDER — MISOPROSTOL 50MCG HALF TABLET
50.0000 ug | ORAL_TABLET | Freq: Once | ORAL | Status: AC
  Administered 2022-05-29: 50 ug via ORAL
  Filled 2022-05-28: qty 1

## 2022-05-28 MED ORDER — LABETALOL HCL 5 MG/ML IV SOLN
INTRAVENOUS | Status: AC
  Administered 2022-05-28: 20 mg
  Filled 2022-05-28: qty 4

## 2022-05-28 MED ORDER — OXYCODONE-ACETAMINOPHEN 5-325 MG PO TABS: 2.0000 | ORAL_TABLET | ORAL | Status: DC | PRN

## 2022-05-28 MED ORDER — TERBUTALINE SULFATE 1 MG/ML IJ SOLN: 0.2500 mg | Freq: Once | INTRAMUSCULAR | Status: DC | PRN

## 2022-05-28 MED ORDER — LABETALOL HCL 5 MG/ML IV SOLN: 20.0000 mg | INTRAVENOUS | Status: DC | PRN

## 2022-05-28 MED ORDER — LACTATED RINGERS IV SOLN: 500.0000 mL | INTRAVENOUS | Status: DC | PRN

## 2022-05-28 MED ORDER — ACETAMINOPHEN 325 MG PO TABS: 650.0000 mg | ORAL_TABLET | ORAL | Status: DC | PRN

## 2022-05-28 MED ORDER — LABETALOL HCL 5 MG/ML IV SOLN: 40.0000 mg | INTRAVENOUS | Status: DC | PRN

## 2022-05-28 MED ORDER — LABETALOL HCL 5 MG/ML IV SOLN: 80.0000 mg | INTRAVENOUS | Status: DC | PRN

## 2022-05-28 MED ORDER — MISOPROSTOL 25 MCG QUARTER TABLET
25.0000 ug | ORAL_TABLET | Freq: Once | ORAL | Status: AC
  Administered 2022-05-29: 25 ug via VAGINAL
  Filled 2022-05-28: qty 1

## 2022-05-28 NOTE — H&P (Addendum)
OBSTETRIC ADMISSION HISTORY AND PHYSICAL  Sharon Ward is a 26 y.o. female G55P1011 with IUP at [redacted]w[redacted]d by LMP c/w 1st trimester U/S presenting for IOL for gHTN/DFM. She reports +FMs, No LOF, no VB, no blurry vision, headaches or peripheral edema, and RUQ pain.  She says she had a headache earlier but not currently. She plans on breast and formula feeding. She request ppIUD for birth control. She received her prenatal care at [place]  Dating: By LMP/1st trimester U/S --->  Estimated Date of Delivery: 06/02/22 Sono:  @[redacted]w[redacted]d , CWD, normal anatomy, breech presentation, 2215g, 49% EFW 4lb 14oz  Prenatal History/Complications: Elevated BP in MAU 10/16 and 10/20(treated with labetalol). Had a low lying placenta but resolved in July. Has a history of bipolar 1, controlled on meds.  Past Medical History: Past Medical History:  Diagnosis Date   Asthma    Bipolar 1 disorder, mixed ([place])    Depression    Generalized anxiety disorder    Intentional drug overdose ([place]) 11/03/2020   Low-lying placenta 01/07/2022   Resolved 03/04/22   Relationship dysfunction    Suicide attempt ([place]) 11/02/2020   Past Surgical History: Past Surgical History:  Procedure Laterality Date   PILONIDAL CYST / SINUS EXCISION  09/11/2013   PILONIDAL CYST EXCISION  05/17/2014   Pilonidal cystectomy with cleft lip   Obstetrical History: OB History     Gravida  3   Para  1   Term  1   Preterm      AB  1   Living  1      SAB  1   IAB      Ectopic      Multiple  0   Live Births  1          Social History Social History   Socioeconomic History   Marital status: Single    Spouse name: Not on file   Number of children: 1   Years of education: 10   Highest education level: 10th grade  Occupational History   Not on file  Tobacco Use   Smoking status: Former    Packs/day: 1.00    Years: 15.00    Total pack years: 15.00    Types: Cigarettes    Quit date: 07/14/2021    Years since quitting: 0.8    Smokeless tobacco: Never   Tobacco comments:    only smoke a "couple" of cigarettes when stressed or anxious, socially with friends per [facility] chart  Vaping Use   Vaping Use: Former   Start date: 08/10/2003   Quit date: 05/04/2021  Substance and Sexual Activity   Alcohol use: Not Currently    Comment: occasional prior to pregnancy   Drug use: Not Currently    Frequency: 4.0 times per week    Types: Marijuana    Comment: No Delta 9 since February 2023   Sexual activity: Not Currently    Partners: Male    Birth control/protection: None  Other Topics Concern   Not on file  Social History Narrative   Not on file   Social Determinants of Health   Financial Resource Strain: Not on file  Food Insecurity: No Food Insecurity (03/18/2022)   Hunger Vital Sign    Worried About Running Out of Food in the Last Year: Never true    Ran Out of Food in the Last Year: Never true  Recent Concern: Food Insecurity - Food Insecurity Present (03/04/2022)   Hunger Vital Sign    Worried  About Running Out of Food in the Last Year: Sometimes true    Ran Out of Food in the Last Year: Never true  Transportation Needs: No Transportation Needs (03/18/2022)   PRAPARE - Hydrologist (Medical): No    Lack of Transportation (Non-Medical): No  Physical Activity: Not on file  Stress: Not on file  Social Connections: Not on file   Family History: Family History  Problem Relation Age of Onset   Asthma Mother    Diabetes Mother    Healthy Mother    Hypertension Father    Asthma Father    Diabetes Father    Healthy Father    Asthma Brother    Hypertension Paternal Uncle    Diabetes Paternal Grandmother    Stroke Paternal Grandfather    Heart disease Paternal Grandfather    Hypertension Paternal Grandfather    Diabetes Paternal Grandfather    Allergies: Allergies  Allergen Reactions   Ascorbate Rash   Citrus Rash   Coconut Flavor Rash   Lamotrigine Rash   Orange  (Diagnostic) Rash   Peach Flavor Rash   Pear Rash   Pineapple Rash   Medications Prior to Admission  Medication Sig Dispense Refill Last Dose   FLUoxetine (PROZAC) 10 MG capsule Take 1 capsule (10 mg total) by mouth daily. 30 capsule 2 05/27/2022   Prenatal Vit-Fe Fumarate-FA (PREPLUS) 27-1 MG TABS Take 1 tablet by mouth at bedtime.   05/27/2022   QUETIAPINE FUMARATE PO Take 75 mg by mouth.   05/27/2022   Blood Pressure Monitoring DEVI 1 each by Does not apply route once a week. 1 each 0    Review of Systems  All systems reviewed and negative except as stated in HPI  Blood pressure 124/67, pulse 95, temperature 97.9 F (36.6 C), resp. rate 18, height 5\' 3"  (1.6 m), last menstrual period 08/16/2021, SpO2 98 %. General appearance: alert, pleasant a little nervous Lungs: clear to auscultation bilaterally Heart: regular rate and rhythm Abdomen: soft, non-tender; bowel sounds normal Extremities: Homans sign is negative, no sign of DVT Presentation: cephalic by ultrasound Fetal monitoring Baseline 130, Moderate variability, + accels, - decels  Uterine activity Uterine  Dilation: Closed (outer os 1.5 cm) Effacement (%): Thick Exam by:: Cloretta Ned, RN  Prenatal labs: ABO, Rh: --/--/O POS (10/20 1837) Antibody: NEG (10/20 1837) Rubella: 1.35 (03/29 1508) RPR: Non Reactive (08/17 0819)  HBsAg: Negative (03/29 1508)  HIV: Non Reactive (08/17 0819)  GBS: Positive/-- (09/27 1639)  GTT: Normal (91/151/92) Genetic screening:  Normal Anatomy U/S: Normal  Prenatal Transfer Tool  Maternal Diabetes: No Genetic Screening: Normal Maternal Ultrasounds/Referrals: Normal Fetal Ultrasounds or other Referrals:  Referred to Materal Fetal Medicine  Maternal Substance Abuse:  Yes:  Type: Marijuana Significant Maternal Medications:  None Significant Maternal Lab Results:  Group B Strep positive Number of Prenatal Visits:greater than 3 verified prenatal visits Other Comments:  None  Results  for orders placed or performed during the hospital encounter of 05/28/22 (from the past 24 hour(s))  Urinalysis, Routine w reflex microscopic Urine, Clean Catch   Collection Time: 05/28/22  6:01 PM  Result Value Ref Range   Color, Urine YELLOW YELLOW   APPearance CLOUDY (A) CLEAR   Specific Gravity, Urine 1.008 1.005 - 1.030   pH 6.0 5.0 - 8.0   Glucose, UA NEGATIVE NEGATIVE mg/dL   Hgb urine dipstick NEGATIVE NEGATIVE   Bilirubin Urine NEGATIVE NEGATIVE   Ketones, ur 5 (A) NEGATIVE mg/dL  Protein, ur NEGATIVE NEGATIVE mg/dL   Nitrite NEGATIVE NEGATIVE   Leukocytes,Ua TRACE (A) NEGATIVE   RBC / HPF 0-5 0 - 5 RBC/hpf   WBC, UA 6-10 0 - 5 WBC/hpf   Bacteria, UA MANY (A) NONE SEEN   Squamous Epithelial / LPF 21-50 0 - 5  Protein / creatinine ratio, urine   Collection Time: 05/28/22  6:01 PM  Result Value Ref Range   Creatinine, Urine 47 mg/dL   Total Protein, Urine 7 mg/dL   Protein Creatinine Ratio 0.15 0.00 - 0.15 mg/mg[Cre]  Type and screen   Collection Time: 05/28/22  6:37 PM  Result Value Ref Range   ABO/RH(D) O POS    Antibody Screen NEG    Sample Expiration      05/31/2022,2359 Performed at [facility], [place] [address], [place], [place] [address]   Comprehensive metabolic panel   Collection Time: 05/28/22  6:38 PM  Result Value Ref Range   Sodium 140 135 - 145 mmol/L   Potassium 3.7 3.5 - 5.1 mmol/L   Chloride 104 98 - 111 mmol/L   CO2 22 22 - 32 mmol/L   Glucose, Bld 109 (H) 70 - 99 mg/dL   BUN 5 (L) 6 - 20 mg/dL   Creatinine, Ser 0.55 0.44 - 1.00 mg/dL   Calcium 9.8 8.9 - 10.3 mg/dL   Total Protein 6.3 (L) 6.5 - 8.1 g/dL   Albumin 3.1 (L) 3.5 - 5.0 g/dL   AST 21 15 - 41 U/L   ALT 14 0 - 44 U/L   Alkaline Phosphatase 151 (H) 38 - 126 U/L   Total Bilirubin 0.4 0.3 - 1.2 mg/dL   GFR, Estimated >60 >60 mL/min   Anion gap 14 5 - 15  CBC   Collection Time: 05/28/22  6:38 PM  Result Value Ref Range   WBC 10.0 4.0 - 10.5 K/uL   RBC 4.47 3.87 - 5.11 MIL/uL    Hemoglobin 13.8 12.0 - 15.0 g/dL   HCT 40.4 36.0 - 46.0 %   MCV 90.4 80.0 - 100.0 fL   MCH 30.9 26.0 - 34.0 pg   MCHC 34.2 30.0 - 36.0 g/dL   RDW 14.0 11.5 - 15.5 %   Platelets 198 150 - 400 K/uL   nRBC 0.0 0.0 - 0.2 %   Patient Active Problem List   Diagnosis Date Noted   Indication for care in labor or delivery 05/28/2022   Echogenic intracardiac focus of fetus on prenatal ultrasound 01/07/2022   GBS bacteriuria 11/09/2021   Bipolar disorder, rapid cycling ([place]) 11/09/2021   Supervision of other normal pregnancy, antepartum 10/21/2021   GAD (generalized anxiety disorder) 09/24/2021   Marijuana abuse 05/05/2021   Prolonged QT interval 11/02/2020   LGSIL on Pap smear of cervix 09/18/2019   Oppositional defiant disorder 09/18/2019   History of dysphagia 02/01/2018   Yeast vaginitis 02/01/2018   Chronic constipation 04/25/2017   Borderline personality disorder ([place]) 10/24/2016   PTSD (post-traumatic stress disorder) 10/24/2016   Gastroesophageal reflux disease without esophagitis 10/24/2016   Mild intermittent asthma without complication 0000000   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   Assessment/Plan:  Sharon Ward is a 26 y.o. G3P1011 at [redacted]w[redacted]d here for IOL for gHTN and DFM. Pt had IOL scheduled for Saturday but has been experiencing pelvic and back pain with decreased fetal movement x several days. Encouraged to present to MAU if she continued to feel DFM and presented today.  #Labor:Starting cytotec 50/25, continue to monitor  #Pain:  Support, continue to assess #FWB: Cat 1  #ID:  GBS positive, PCN ordered #MOF: Both #MOC: ppIUD #Circ:  N/A  Spiro Ausborn Autry-Lott, DO  05/29/2022, 4:27 AM

## 2022-05-28 NOTE — MAU Provider Note (Signed)
Event Date/Time   First Provider Initiated Contact with Patient 05/28/22 1801      S [name] is a 26 y.o. [phone] patient who presents to MAU today with complaint of HTN.   O BP 131/69 (BP Location: Right Arm)   Pulse 96   Temp 97.9 F (36.6 C)   Resp 18   Ht 5\' 3"  (1.6 m)   LMP 08/16/2021 (Exact Date)   SpO2 98%   BMI 44.87 kg/m  Physical Exam Constitutional:      Appearance: She is well-developed.  HENT:     Head: Normocephalic.  Eyes:     Pupils: Pupils are equal, round, and reactive to light.  Cardiovascular:     Rate and Rhythm: Normal rate and regular rhythm.     Heart sounds: Normal heart sounds.  Pulmonary:     Effort: Pulmonary effort is normal. No respiratory distress.     Breath sounds: Normal breath sounds.  Abdominal:     Palpations: Abdomen is soft.     Tenderness: There is no abdominal tenderness.  Genitourinary:    Vagina: No bleeding. Vaginal discharge: mucusy.    Comments: External: no lesion Vagina: small amount of white discharge Dilation: Closed (outer os 1.5 cm) Effacement (%): Thick Cervical Position: Posterior Presentation: Undeterminable Exam by:: Cloretta Ned, RN   Musculoskeletal:        General: Normal range of motion.     Cervical back: Normal range of motion and neck supple.  Skin:    General: Skin is warm and dry.  Neurological:     Mental Status: She is alert and oriented to person, place, and time.  Psychiatric:        Mood and Affect: Mood normal.        Behavior: Behavior normal.    Patient Vitals for the past 24 hrs:  BP Temp Pulse Resp SpO2 Height  05/28/22 1800 131/69 -- 96 -- 98 % --  05/28/22 1751 (!) 147/76 -- 96 -- -- --  05/28/22 1713 (!) 147/74 97.9 F (36.6 C) 98 18 -- 5\' 3"  (1.6 m)   Pt informed that the ultrasound is considered a limited OB ultrasound and is not intended to be a complete ultrasound exam.  Patient also informed that the ultrasound is not being completed with the intent of  assessing for fetal or placental anomalies or any pelvic abnormalities.  Explained that the purpose of today's ultrasound is to assess for  presentation.  Patient acknowledges the purpose of the exam and the limitations of the study.   VERTEX  A Medical screening exam complete GHTN 39 weeks   P Admit to labor and delivery  Labor team aware   Marcille Buffy DNP, CNM  05/28/22  6:36 PM

## 2022-05-28 NOTE — MAU Note (Addendum)
.  Sharon Ward is a 26 y.o. at [redacted]w[redacted]d here in MAU reporting: c/o decreased fetal movement  for the past 2 days,and pelvic pain with and elevated b/p off and on all week.  LMP:  Onset of complaint: 2 days Pain score: 8 Vitals:   05/28/22 1713  BP: (!) 147/74  Pulse: 98  Resp: 18  Temp: 97.9 F (36.6 C)     FHT:131 Lab orders placed from triage:

## 2022-05-29 ENCOUNTER — Inpatient Hospital Stay (HOSPITAL_COMMUNITY): Payer: Medicaid Other | Admitting: Anesthesiology

## 2022-05-29 ENCOUNTER — Encounter (HOSPITAL_COMMUNITY): Payer: Self-pay | Admitting: Family Medicine

## 2022-05-29 ENCOUNTER — Other Ambulatory Visit: Payer: Self-pay

## 2022-05-29 ENCOUNTER — Inpatient Hospital Stay (HOSPITAL_COMMUNITY): Admission: RE | Admit: 2022-05-29 | Payer: Medicaid Other | Source: Ambulatory Visit

## 2022-05-29 DIAGNOSIS — O99324 Drug use complicating childbirth: Secondary | ICD-10-CM

## 2022-05-29 DIAGNOSIS — Z3A39 39 weeks gestation of pregnancy: Secondary | ICD-10-CM

## 2022-05-29 DIAGNOSIS — O134 Gestational [pregnancy-induced] hypertension without significant proteinuria, complicating childbirth: Secondary | ICD-10-CM

## 2022-05-29 DIAGNOSIS — O99824 Streptococcus B carrier state complicating childbirth: Secondary | ICD-10-CM

## 2022-05-29 LAB — CBC
HCT: 38.3 % (ref 36.0–46.0)
HCT: 40.8 % (ref 36.0–46.0)
Hemoglobin: 13.2 g/dL (ref 12.0–15.0)
Hemoglobin: 13.8 g/dL (ref 12.0–15.0)
MCH: 31.5 pg (ref 26.0–34.0)
MCH: 31.4 pg (ref 26.0–34.0)
MCHC: 34.5 g/dL (ref 30.0–36.0)
MCHC: 33.8 g/dL (ref 30.0–36.0)
MCV: 91.4 fL (ref 80.0–100.0)
MCV: 92.7 fL (ref 80.0–100.0)
Platelets: 179 10*3/uL (ref 150–400)
Platelets: 201 10*3/uL (ref 150–400)
RBC: 4.19 MIL/uL (ref 3.87–5.11)
RBC: 4.4 MIL/uL (ref 3.87–5.11)
RDW: 14.3 % (ref 11.5–15.5)
RDW: 14.2 % (ref 11.5–15.5)
WBC: 8.3 10*3/uL (ref 4.0–10.5)
WBC: 10.1 10*3/uL (ref 4.0–10.5)
nRBC: 0 % (ref 0.0–0.2)
nRBC: 0 % (ref 0.0–0.2)

## 2022-05-29 LAB — RPR: RPR Ser Ql: NONREACTIVE

## 2022-05-29 MED ORDER — DIPHENHYDRAMINE HCL 25 MG PO CAPS: 25.0000 mg | ORAL_CAPSULE | Freq: Four times a day (QID) | ORAL | Status: DC | PRN

## 2022-05-29 MED ORDER — FENTANYL-BUPIVACAINE-NACL 0.5-0.125-0.9 MG/250ML-% EP SOLN
EPIDURAL | Status: DC | PRN
  Administered 2022-05-29: 12 mL/h via EPIDURAL

## 2022-05-29 MED ORDER — EPHEDRINE 5 MG/ML INJ: 10.0000 mg | INTRAVENOUS | Status: DC | PRN

## 2022-05-29 MED ORDER — ACETAMINOPHEN 325 MG PO TABS
650.0000 mg | ORAL_TABLET | ORAL | Status: DC | PRN
  Administered 2022-05-30: 650 mg via ORAL
  Filled 2022-05-29: qty 2

## 2022-05-29 MED ORDER — PHENYLEPHRINE 80 MCG/ML (10ML) SYRINGE FOR IV PUSH (FOR BLOOD PRESSURE SUPPORT): 80.0000 ug | PREFILLED_SYRINGE | INTRAVENOUS | Status: DC | PRN

## 2022-05-29 MED ORDER — IBUPROFEN 600 MG PO TABS
600.0000 mg | ORAL_TABLET | Freq: Four times a day (QID) | ORAL | Status: DC
  Administered 2022-05-29 – 2022-05-30 (×5): 600 mg via ORAL
  Filled 2022-05-29 (×6): qty 1

## 2022-05-29 MED ORDER — ZOLPIDEM TARTRATE 5 MG PO TABS: 5.0000 mg | ORAL_TABLET | Freq: Every evening | ORAL | Status: DC | PRN

## 2022-05-29 MED ORDER — DIBUCAINE (PERIANAL) 1 % EX OINT: 1.0000 | TOPICAL_OINTMENT | CUTANEOUS | Status: DC | PRN

## 2022-05-29 MED ORDER — SODIUM CHLORIDE 0.9% FLUSH: 3.0000 mL | Freq: Two times a day (BID) | INTRAVENOUS | Status: DC

## 2022-05-29 MED ORDER — LACTATED RINGERS IV SOLN
500.0000 mL | Freq: Once | INTRAVENOUS | Status: AC
  Administered 2022-05-29: 500 mL via INTRAVENOUS

## 2022-05-29 MED ORDER — OXYTOCIN-SODIUM CHLORIDE 30-0.9 UT/500ML-% IV SOLN
1.0000 m[IU]/min | INTRAVENOUS | Status: DC
  Administered 2022-05-29: 2 m[IU]/min via INTRAVENOUS

## 2022-05-29 MED ORDER — TERBUTALINE SULFATE 1 MG/ML IJ SOLN: 0.2500 mg | Freq: Once | INTRAMUSCULAR | Status: DC | PRN

## 2022-05-29 MED ORDER — SODIUM CHLORIDE 0.9 % IV SOLN: 250.0000 mL | INTRAVENOUS | Status: DC | PRN

## 2022-05-29 MED ORDER — SODIUM CHLORIDE 0.9% FLUSH: 3.0000 mL | INTRAVENOUS | Status: DC | PRN

## 2022-05-29 MED ORDER — WITCH HAZEL-GLYCERIN EX PADS: 1.0000 | MEDICATED_PAD | CUTANEOUS | Status: DC | PRN

## 2022-05-29 MED ORDER — SIMETHICONE 80 MG PO CHEW: 80.0000 mg | CHEWABLE_TABLET | ORAL | Status: DC | PRN

## 2022-05-29 MED ORDER — SENNOSIDES-DOCUSATE SODIUM 8.6-50 MG PO TABS
2.0000 | ORAL_TABLET | ORAL | Status: DC
  Administered 2022-05-30: 2 via ORAL
  Filled 2022-05-29 (×2): qty 2

## 2022-05-29 MED ORDER — DIPHENHYDRAMINE HCL 50 MG/ML IJ SOLN: 12.5000 mg | INTRAMUSCULAR | Status: DC | PRN

## 2022-05-29 MED ORDER — FUROSEMIDE 20 MG PO TABS
20.0000 mg | ORAL_TABLET | Freq: Two times a day (BID) | ORAL | Status: DC
  Administered 2022-05-30 – 2022-05-31 (×3): 20 mg via ORAL
  Filled 2022-05-29 (×3): qty 1

## 2022-05-29 MED ORDER — COCONUT OIL OIL: 1.0000 | TOPICAL_OIL | Status: DC | PRN

## 2022-05-29 MED ORDER — MISOPROSTOL 50MCG HALF TABLET
50.0000 ug | ORAL_TABLET | Freq: Once | ORAL | Status: AC
  Administered 2022-05-29: 50 ug via ORAL
  Filled 2022-05-29: qty 1

## 2022-05-29 MED ORDER — FENTANYL-BUPIVACAINE-NACL 0.5-0.125-0.9 MG/250ML-% EP SOLN
12.0000 mL/h | EPIDURAL | Status: DC | PRN
  Filled 2022-05-29: qty 250

## 2022-05-29 MED ORDER — FLUOXETINE HCL 10 MG PO CAPS
10.0000 mg | ORAL_CAPSULE | Freq: Every day | ORAL | Status: DC
  Administered 2022-05-29 – 2022-05-31 (×2): 10 mg via ORAL
  Filled 2022-05-29 (×3): qty 1

## 2022-05-29 MED ORDER — PARAGARD INTRAUTERINE COPPER IU IUD: 1.0000 | INTRAUTERINE_SYSTEM | Freq: Once | INTRAUTERINE | Status: DC

## 2022-05-29 MED ORDER — LIDOCAINE HCL (PF) 1 % IJ SOLN
INTRAMUSCULAR | Status: DC | PRN
  Administered 2022-05-29: 2 mL via EPIDURAL
  Administered 2022-05-29: 10 mL via EPIDURAL

## 2022-05-29 MED ORDER — BENZOCAINE-MENTHOL 20-0.5 % EX AERO: 1.0000 | INHALATION_SPRAY | CUTANEOUS | Status: DC | PRN

## 2022-05-29 MED ORDER — QUETIAPINE FUMARATE 25 MG PO TABS
75.0000 mg | ORAL_TABLET | Freq: Every day | ORAL | Status: DC
  Administered 2022-05-29 – 2022-05-30 (×2): 75 mg via ORAL
  Filled 2022-05-29: qty 1
  Filled 2022-05-29 (×2): qty 3

## 2022-05-29 NOTE — Anesthesia Preprocedure Evaluation (Signed)
Anesthesia Evaluation  Patient identified by MRN, date of birth, ID band Patient awake    Reviewed: Allergy & Precautions, Patient's Chart, lab work & pertinent test results  Airway Mallampati: III  TM Distance: >3 FB Neck ROM: Full    Dental no notable dental hx.    Pulmonary asthma , former smoker   Pulmonary exam normal breath sounds clear to auscultation       Cardiovascular negative cardio ROS Normal cardiovascular exam Rhythm:Regular Rate:Normal     Neuro/Psych  PSYCHIATRIC DISORDERS Anxiety Depression Bipolar Disorder   negative neurological ROS     GI/Hepatic Neg liver ROS,GERD  ,,  Endo/Other    Morbid obesityBMI 45  Renal/GU negative Renal ROS  negative genitourinary   Musculoskeletal negative musculoskeletal ROS (+)    Abdominal  (+) + obese  Peds negative pediatric ROS (+)  Hematology negative hematology ROS (+) Hb 13.8, plt 201   Anesthesia Other Findings   Reproductive/Obstetrics (+) Pregnancy                             Anesthesia Physical Anesthesia Plan  ASA: 3  Anesthesia Plan: Epidural   Post-op Pain Management:    Induction:   PONV Risk Score and Plan: 2  Airway Management Planned: Natural Airway  Additional Equipment: None  Intra-op Plan:   Post-operative Plan:   Informed Consent: I have reviewed the patients History and Physical, chart, labs and discussed the procedure including the risks, benefits and alternatives for the proposed anesthesia with the patient or authorized representative who has indicated his/her understanding and acceptance.       Plan Discussed with:   Anesthesia Plan Comments: (Waited until 7cm for epidural, very uncooperative with positioning the entire time )       Anesthesia Quick Evaluation

## 2022-05-29 NOTE — Plan of Care (Signed)
  Problem: Education: Goal: Knowledge of Childbirth will improve Outcome: Completed/Met Goal: Ability to make informed decisions regarding treatment and plan of care will improve Outcome: Completed/Met Goal: Ability to state and carry out methods to decrease the pain will improve Outcome: Completed/Met Goal: Individualized Educational Video(s) Outcome: Completed/Met   Problem: Coping: Goal: Ability to verbalize concerns and feelings about labor and delivery will improve Outcome: Completed/Met   Problem: Life Cycle: Goal: Ability to make normal progression through stages of labor will improve Outcome: Completed/Met Goal: Ability to effectively push during vaginal delivery will improve Outcome: Completed/Met   Problem: Role Relationship: Goal: Will demonstrate positive interactions with the child Outcome: Completed/Met   Problem: Safety: Goal: Risk of complications during labor and delivery will decrease Outcome: Completed/Met   Problem: Pain Management: Goal: Relief or control of pain from uterine contractions will improve Outcome: Completed/Met   Problem: Education: Goal: Knowledge of General Education information will improve Description: Including pain rating scale, medication(s)/side effects and non-pharmacologic comfort measures Outcome: Completed/Met   Problem: Health Behavior/Discharge Planning: Goal: Ability to manage health-related needs will improve Outcome: Completed/Met   Problem: Clinical Measurements: Goal: Ability to maintain clinical measurements within normal limits will improve Outcome: Completed/Met Goal: Will remain free from infection Outcome: Completed/Met Goal: Diagnostic test results will improve Outcome: Completed/Met Goal: Respiratory complications will improve Outcome: Completed/Met Goal: Cardiovascular complication will be avoided Outcome: Completed/Met   Problem: Activity: Goal: Risk for activity intolerance will decrease Outcome:  Completed/Met   Problem: Nutrition: Goal: Adequate nutrition will be maintained Outcome: Completed/Met   Problem: Coping: Goal: Level of anxiety will decrease Outcome: Completed/Met   Problem: Elimination: Goal: Will not experience complications related to bowel motility Outcome: Completed/Met Goal: Will not experience complications related to urinary retention Outcome: Completed/Met   Problem: Pain Managment: Goal: General experience of comfort will improve Outcome: Completed/Met   Problem: Safety: Goal: Ability to remain free from injury will improve Outcome: Completed/Met   Problem: Skin Integrity: Goal: Risk for impaired skin integrity will decrease Outcome: Completed/Met   Problem: Education: Goal: Knowledge of disease or condition will improve Outcome: Completed/Met Goal: Knowledge of the prescribed therapeutic regimen will improve Outcome: Completed/Met   Problem: Fluid Volume: Goal: Peripheral tissue perfusion will improve Outcome: Completed/Met   Problem: Clinical Measurements: Goal: Complications related to disease process, condition or treatment will be avoided or minimized Outcome: Completed/Met

## 2022-05-29 NOTE — Progress Notes (Signed)
Labor Progress Note Sharon Ward is a 26 y.o. G3P1011 at [redacted]w[redacted]d presented for gHTN. DFM  S: Pt doing well.  O:  BP 131/76   Pulse 74   Temp 97.9 F (36.6 C) (Oral)   Resp 16   Ht 5\' 3"  (1.6 m)   LMP 08/16/2021 (Exact Date)   SpO2 99%   BMI 44.87 kg/m  EFM: 135 bpm/Moderate variability/ 15x15 accels/ None decels   CVE: Dilation: 3 Effacement (%): 50 Cervical Position: Posterior Station: -3 Presentation: Vertex Exam by:: Dr. Clement Husbands   A&P: 82 y.o. [id number] [redacted]w[redacted]d Iol as above #Labor: Progressing well. S/p cytotec 50/25 PO/VAG, 50 buccal. Pit started at 1159. Cont pitocin as tolerated #Pain: Epidural planning #FWB: CAT 1 #GBS positive- PCN ongoing #gHTN: BP elevated. Labetolol prn IV for severe pressures, none needed so far. #Bipolar disorder: Continue seroquel and prozac  Ernestine Conrad Mercado-Ortiz, DO 3:17 PM

## 2022-05-29 NOTE — Anesthesia Procedure Notes (Addendum)
Epidural Patient location during procedure: OB Start time: 05/29/2022 6:51 PM End time: 05/29/2022 7:04 PM  Staffing Anesthesiologist: Pervis Hocking, DO Performed: anesthesiologist   Preanesthetic Checklist Completed: patient identified, IV checked, risks and benefits discussed, monitors and equipment checked, pre-op evaluation and timeout performed  Epidural Patient position: sitting Prep: DuraPrep and site prepped and draped Patient monitoring: continuous pulse ox, blood pressure, heart rate and cardiac monitor Approach: midline Location: L3-L4 Injection technique: LOR air  Needle:  Needle type: Tuohy  Needle gauge: 17 G Needle length: 9 cm Needle insertion depth: 9 cm Catheter type: closed end flexible Catheter size: 19 Gauge Catheter at skin depth: 15 cm Test dose: negative  Assessment Sensory level: T8 Events: blood not aspirated, injection not painful, no injection resistance, no paresthesia and negative IV test  Additional Notes Patient identified. Risks/Benefits/Options discussed with patient including but not limited to bleeding, infection, nerve damage, paralysis, failed block, incomplete pain control, headache, blood pressure changes, nausea, vomiting, reactions to medication both or allergic, itching and postpartum back pain. Confirmed with bedside nurse the patient's most recent platelet count. Confirmed with patient that they are not currently taking any anticoagulation, have any bleeding history or any family history of bleeding disorders. Patient expressed understanding and wished to proceed. All questions were answered. Sterile technique was used throughout the entire procedure. Please see nursing notes for vital signs. Test dose was given through epidural catheter and negative prior to continuing to dose epidural or start infusion. Warning signs of high block given to the patient including shortness of breath, tingling/numbness in hands, complete motor  block, or any concerning symptoms with instructions to call for help. Patient was given instructions on fall risk and not to get out of bed. All questions and concerns addressed with instructions to call with any issues or inadequate analgesia.     7cm when requested epidural, very difficult to position and uncooperative throughout the procedure. Reason for block:procedure for pain

## 2022-05-29 NOTE — Progress Notes (Cosign Needed)
LABOR PROGRESS NOTE  Sharon Ward is a 26 y.o. G3P1011 at [redacted]w[redacted]d  admitted for IOL gHTN/DFM.   Subjective: Patient was sleeping. When woken up was comfortable. Denies HA, Vision changes.   Objective: BP 122/68   Pulse 78   Temp 97.9 F (36.6 C) (Oral)   Resp 15   Ht 5\' 3"  (1.6 m)   LMP 08/16/2021 (Exact Date)   SpO2 99%   BMI 44.87 kg/m  or  Vitals:   05/28/22 2331 05/29/22 0109 05/29/22 0234 05/29/22 0350  BP: (!) 115/46 129/61 115/61 122/68  Pulse: 85 84 89 78  Resp:  17 16 15   Temp:  97.9 F (36.6 C)    TempSrc:  Oral    SpO2:      Height:        SVE Dilation: 1.5 Effacement (%): 50 Cervical Position: Posterior Station: -3, -2 Presentation: Vertex Exam by:: Rebeca Alert, RN, exam unchanged  FHT: baseline rate 135, moderate varibility, +acel, -decel Toco: 3-4 minutes   Labs: Lab Results  Component Value Date   WBC 10.0 05/28/2022   HGB 13.8 05/28/2022   HCT 40.4 05/28/2022   MCV 90.4 05/28/2022   PLT 198 05/28/2022    Patient Active Problem List   Diagnosis Date Noted   Indication for care in labor or delivery 05/28/2022   Echogenic intracardiac focus of fetus on prenatal ultrasound 01/07/2022   GBS bacteriuria 11/09/2021   Bipolar disorder, rapid cycling ([place]) 11/09/2021   Supervision of other normal pregnancy, antepartum 10/21/2021   GAD (generalized anxiety disorder) 09/24/2021   Marijuana abuse 05/05/2021   Prolonged QT interval 11/02/2020   LGSIL on Pap smear of cervix 09/18/2019   Oppositional defiant disorder 09/18/2019   History of dysphagia 02/01/2018   Yeast vaginitis 02/01/2018   Chronic constipation 04/25/2017   Borderline personality disorder ([place]) 10/24/2016   PTSD (post-traumatic stress disorder) 10/24/2016   Gastroesophageal reflux disease without esophagitis 10/24/2016   Mild intermittent asthma without complication 02/06/1600   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    Assessment / Plan: 26 y.o. G3P1011 at [redacted]w[redacted]d here for IOL s/t  gHTN/DFM  Labor: give another cytotec as patient is unchanged  Fetal Wellbeing:  Cat 1  Pain Control:  Patient is comfortable, continue to assess Anticipated MOD:  SVD  Lowry Ram, MD  PGY-1, [facility]  05/29/2022, 6:43 AM

## 2022-05-29 NOTE — Discharge Summary (Signed)
Postpartum Discharge Summary  Date of Service updated***     [name] DOB: [birth year] MRN: [id number]  Date of admission: 05/28/2022 Delivery date:05/29/2022  Delivering provider: Shelda Pal  Date of discharge: 05/29/2022  Admitting diagnosis: Indication for care in labor or delivery [O75.9] Intrauterine pregnancy: [redacted]w[redacted]d    Secondary diagnosis:  Principal Problem:   Vaginal delivery Active Problems:   Borderline personality disorder ([place]   PTSD (post-traumatic stress disorder)   GAD (generalized anxiety disorder)   Supervision of other normal pregnancy, antepartum   GBS bacteriuria   Bipolar disorder, rapid cycling ([place]   Echogenic intracardiac focus of fetus on prenatal ultrasound  Additional problems: ***    Discharge diagnosis: Term Pregnancy Delivered                                              Post partum procedures:{Postpartum procedures:23558} Augmentation: AROM, Pitocin, and Cytotec Complications: None  Hospital course: Induction of Labor With Vaginal Delivery   26y.o. yo G3P1011 at 354w3das admitted to the hospital 05/28/2022 for induction of labor.  Indication for induction: Gestational hypertension.  Patient had an labor course complicated bynone Membrane Rupture Time/Date: 6:01 PM ,05/29/2022   Delivery Method:Vaginal, Spontaneous  Episiotomy: None  Lacerations:  Periurethral  Details of delivery can be found in separate delivery note.  Patient had a postpartum course complicated by***. Patient is discharged home 05/29/22.  Newborn Data: Birth date:05/29/2022  Birth time:7:55 PM  Gender:Female  Living status:Living  Apgars:9 ,9  Weight:   Magnesium Sulfate received: {Mag received:30440022} BMZ received: No Rhophylac:No MMR:N/A T-DaP:Given prenatally Flu: No Transfusion:{Transfusion received:30440034}  Physical exam  Vitals:   05/29/22 1836 05/29/22 1904 05/29/22 1906 05/29/22 1930  BP:  132/72  126/63   Pulse:  90  76  Resp:      Temp:      TempSrc:      SpO2: 100%  94%   Height:       General: {Exam; general:21111117} Lochia: {Desc; appropriate/inappropriate:30686::"appropriate"} Uterine Fundus: {Desc; firm/soft:30687} Incision: {Exam; incision:21111123} DVT Evaluation: {Exam; dvt:2111122} Labs: Lab Results  Component Value Date   WBC 10.1 05/29/2022   HGB 13.8 05/29/2022   HCT 40.8 05/29/2022   MCV 92.7 05/29/2022   PLT 201 05/29/2022      Latest Ref Rng & Units 05/28/2022    6:38 PM  CMP  Glucose 70 - 99 mg/dL 109   BUN 6 - 20 mg/dL 5   Creatinine 0.44 - 1.00 mg/dL 0.55   Sodium 135 - 145 mmol/L 140   Potassium 3.5 - 5.1 mmol/L 3.7   Chloride 98 - 111 mmol/L 104   CO2 22 - 32 mmol/L 22   Calcium 8.9 - 10.3 mg/dL 9.8   Total Protein 6.5 - 8.1 g/dL 6.3   Total Bilirubin 0.3 - 1.2 mg/dL 0.4   Alkaline Phos 38 - 126 U/L 151   AST 15 - 41 U/L 21   ALT 0 - 44 U/L 14    Edinburgh Score:    03/04/2020    2:07 PM  Edinburgh Postnatal Depression Scale Screening Tool  I have been able to laugh and see the funny side of things. 0  I have looked forward with enjoyment to things. 2  I have blamed myself unnecessarily when things went wrong. 2  I have been anxious or worried for  no good reason. 3  I have felt scared or panicky for no good reason. 3  Things have been getting on top of me. 1  I have been so unhappy that I have had difficulty sleeping. 2  I have felt sad or miserable. 2  I have been so unhappy that I have been crying. 2  The thought of harming myself has occurred to me. 0  Edinburgh Postnatal Depression Scale Total 17     After visit meds:  Allergies as of 05/29/2022       Reactions   Ascorbate Rash   Citrus Rash   Coconut Flavor Rash   Lamotrigine Rash   Orange (diagnostic) Rash   Peach Flavor Rash   Pear Rash   Pineapple Rash     Med Rec must be completed prior to using this [place]***        Discharge home in stable  condition Infant Feeding: {Baby feeding:23562} Infant Disposition:{CHL IP OB HOME WITH MIWOEH:21224} Discharge instruction: per After Visit Summary and Postpartum booklet. Activity: Advance as tolerated. Pelvic rest for 6 weeks.  Diet: {OB MGNO:03704888} Future Appointments:No future appointments.  Follow up Visit: Message sent to [facility] 10/21  Please schedule this patient for a In person postpartum visit in 6 weeks with the following provider: Any provider. Additional Postpartum F/U:Postpartum Depression checkup and BP check 1 week  High risk pregnancy complicated by: HTN Delivery mode:  Vaginal, Spontaneous  Anticipated Birth Control:  IUD outpt   05/29/2022 Shelda Pal, DO

## 2022-05-29 NOTE — Lactation Note (Signed)
This note was copied from a baby's chart. Lactation Consultation Note  [name] [id number] Date: 05/29/2022 Reason for consult: L&D Initial assessment Age:24 hours P2, term female infant. See maternal data below:  regarding Birth Parent past BF experience, LC explained that this is different infant and experience and ask for help if needed with latching infant at the breast. Birth Parent latched infant on her left breast using the football hold position, infant sustained latch during the feeding and was still breastfeeding after 12 minutes when [place] left the room. Birth Parent will continue to BF infant according to hunger cues, on demand, 8 to 12+ times within 24 hours, STS. Birth Parent knows to ask RN/LC on MBU for further latch assistance if need. Maternal Data Does the patient have breastfeeding experience prior to this delivery?: Yes How long did the patient breastfeed?: Per Birth Parent, she only BF her 44 year old in hospital, infant had difficulty with latching at the breast and she felt overwhelmed.  Feeding Mother's Current Feeding Choice: Breast Milk and Formula  LATCH Score Latch: Grasps breast easily, tongue down, lips flanged, rhythmical sucking.  Audible Swallowing: A few with stimulation  Type of Nipple: Everted at rest and after stimulation  Comfort (Breast/Nipple): Soft / non-tender  Hold (Positioning): Assistance needed to correctly position infant at breast and maintain latch.  LATCH Score: 8   Lactation Tools Discussed/Used    Interventions Interventions: Skin to skin;Breast massage;Breast compression;Position options;Support pillows;Adjust position;Education  Discharge    Consult Status Consult Status: Follow-up from L&D    Vicente Serene 05/29/2022, 8:48 PM

## 2022-05-29 NOTE — Progress Notes (Signed)
Labor Progress Note Locklyn Henriquez is a 26 y.o. G3P1011 at [redacted]w[redacted]d presented for gHTN. DFM  S: Pt doing well. Feeling her contractions, but not in much pain  O:  BP (!) 149/68   Pulse 71   Temp (!) 97.4 F (36.3 C) (Oral)   Resp 17   Ht 5\' 3"  (1.6 m)   LMP 08/16/2021 (Exact Date)   SpO2 99%   BMI 44.87 kg/m  EFM: 130 bpm/Moderate variability/ 15x15 accels/ Variable decels   CVE: Dilation: 4 Effacement (%): 50 Cervical Position: Posterior, Middle Station: -2 Presentation: Vertex Exam by:: Dr. Clement Husbands   A&P: 40 y.o. [id number] [redacted]w[redacted]d Iol as above #Labor: Progressing well. AROM 1801 clear and IUPC placed #Pain: Epidural planning #FWB: CAT 1 #GBS positive- PCN ongoing #gHTN: BP elevated. Labetolol prn IV for severe pressures, none needed so far. Pre-E labs nml #Bipolar disorder: Continue seroquel and prozac  Shelda Pal, DO 6:19 PM

## 2022-05-30 LAB — CBC
HCT: 39.5 % (ref 36.0–46.0)
Hemoglobin: 13.4 g/dL (ref 12.0–15.0)
MCH: 31.2 pg (ref 26.0–34.0)
MCHC: 33.9 g/dL (ref 30.0–36.0)
MCV: 92.1 fL (ref 80.0–100.0)
Platelets: 193 10*3/uL (ref 150–400)
RBC: 4.29 MIL/uL (ref 3.87–5.11)
RDW: 14.3 % (ref 11.5–15.5)
WBC: 11.2 10*3/uL — ABNORMAL HIGH (ref 4.0–10.5)
nRBC: 0 % (ref 0.0–0.2)

## 2022-05-30 NOTE — Progress Notes (Signed)
.  Post Partum Day 1 Subjective: no complaints, voiding, and tolerating PO. No BM since birth.  Objective: Blood pressure (!) 119/57, pulse 77, temperature 98 F (36.7 C), temperature source Oral, resp. rate 18, height 5\' 3"  (1.6 m), last menstrual period 08/16/2021, SpO2 97 %, unknown if currently breastfeeding.  Physical Exam:  General: alert, cooperative, appears stated age, and no distress MSK: 1+ pitting edema bilaterally to midshin Lochia: appropriate Uterine Fundus: soft DVT Evaluation: No evidence of DVT seen on physical exam.  Recent Labs    05/29/22 1829 05/30/22 0437  HGB 13.8 13.4  HCT 40.8 39.5     Assessment/Plan: Contraception Paraguard with placement at outpatient appointment.    Elevated BP in PP BP in the 140s/70s overnight. BP 119/57 this morning. Pt started on Lasix.  Feeding: Breast   LOS: 2 days   Shirlyn Goltz, Medical Student 05/30/2022, 9:59 AM
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
## 2022-05-30 NOTE — Progress Notes (Deleted)
.  Post Partum Day 1 Subjective: no complaints, voiding, and tolerating PO. No BM since birth.  Objective: Blood pressure (!) 119/57, pulse 77, temperature 98 F (36.7 C), temperature source Oral, resp. rate 18, height 5\' 3"  (1.6 m), last menstrual period 08/16/2021, SpO2 97 %, unknown if currently breastfeeding.  Physical Exam:  General: alert, cooperative, appears stated age, and no distress MSK: 1+ pitting edema bilaterally to midshin Lochia: appropriate Uterine Fundus: soft DVT Evaluation: No evidence of DVT seen on physical exam.  Recent Labs    05/29/22 1829 05/30/22 0437  HGB 13.8 13.4  HCT 40.8 39.5    Assessment/Plan: Contraception Paraguard with placement at outpatient appointment.    Elevated BP in PP BP in the 140s/70s overnight. BP 119/57 this morning. Pt started on Lasix.   LOS: 2 days   Shirlyn Goltz, Medical Student 05/30/2022, 8:22 AM

## 2022-05-30 NOTE — Lactation Note (Signed)
This note was copied from a baby's chart. Lactation Consultation Note  [name] IRWER'X Date: 05/30/2022 Reason for consult: Follow-up assessment;Mother's request;Difficult latch;1st time breastfeeding;Infant weight loss;Breastfeeding assistance (0.91% WL) Age:24 hours  LC entered the room and the birth parent had just finished breastfeeding the infant.  The birth parent burped the infant and LC assisted with latching the infant to the breast again.  Infant latched to the right breast in the football position with lips flanged, tongue down, and the sucking was rhythmic.  Some swallows were noted.  The infant began to make a clicking noise at the breast.  LC assessed the infant's oral cavity and she may have a tight labial frenula.  Her upper lip is able to flange, but she tends to slip to the tip of the nipple.  LC assisted the birth parent with lifting the infant up to the breast.  LC discussed the importance of getting a deep latch and supporting the infant with firm head control.  The birth parent breast fed the infant for 10 more min.  The infant came off the breast and continued to show feeding cues.  The birth parent fed the infant 10 mL of Similac 360 using the purple slow flow nipple.  The infant was making a clicking sound on the bottle as well.  The infant stopped sucking and the birth parent placed her STS.  She began showing feeding cues again.  The birth parent gave her 22mL of formula.  LC spoke with the birth parent about cluster feeding and praised her efforts.  The birth parent will call RN/LC for assistance with breastfeeding.   Maternal Data    Feeding Mother's Current Feeding Choice: Breast Milk and Formula Nipple Type: Nfant Slow Flow (purple)  LATCH Score Latch: Grasps breast easily, tongue down, lips flanged, rhythmical sucking.  Audible Swallowing: A few with stimulation  Type of Nipple: Everted at rest and after  stimulation  Comfort (Breast/Nipple): Soft / non-tender  Hold (Positioning): Assistance needed to correctly position infant at breast and maintain latch.  LATCH Score: 8   Lactation Tools Discussed/Used    Interventions Interventions: Breast feeding basics reviewed;Assisted with latch;Adjust position  Discharge Pump: Hands Free (Elvie Stride) WIC Program: Yes  Consult Status Consult Status: Follow-up Date: 05/31/22 Follow-up type: In-patient    Lysbeth Penner 05/30/2022, 6:06 PM

## 2022-05-30 NOTE — Lactation Note (Signed)
This note was copied from a baby's chart. Lactation Consultation Note  Patient Name: Girl Tamecia Mcdougald HYIFO'Y Date: 05/30/2022 Reason for consult: Initial assessment;Term Age:24 hours  LC in to visit with P2 Mom of term baby.  Mom about to take her shower, RN covering her IV port.  Talked in length to Mom about her home situation.  Mom has been through a lot but feels that this baby latched after birth and she would like help with next feeding.  Baby has had 7 feedings of formula by bottle so far.  Reassured Mom that she can ask for help and move forward.    After shower, Mom encouraged to place baby STS and call for assistance with latch when baby starts cueing.  Maternal Data Does the patient have breastfeeding experience prior to this delivery?: Yes How long did the patient breastfeed?: 3 weeks  Feeding Mother's Current Feeding Choice: Breast Milk and Formula Nipple Type: Nfant Slow Flow (purple)  Interventions Interventions: Breast feeding basics reviewed;Skin to skin;Breast massage;Hand express;LC Services brochure  Discharge [place] Program: Yes  Consult Status Consult Status: Follow-up Date: 05/31/22 Follow-up type: In-patient    Broadus John 05/30/2022, 10:05 AM

## 2022-05-30 NOTE — Progress Notes (Signed)
POSTPARTUM PROGRESS NOTE  Post Partum Day 1  Subjective:  Sharon Ward is a 26 y.o. [id number] s/p SVD at [redacted]w[redacted]d.  She reports she is doing well. No acute events overnight. She denies any problems with ambulating, voiding or po intake. Denies nausea or vomiting.  Pain is well controlled.  Lochia is minimal.  Objective: Blood pressure (!) 119/57, pulse 77, temperature 98 F (36.7 C), temperature source Oral, resp. rate 18, height 5\' 3"  (1.6 m), last menstrual period 08/16/2021, SpO2 97 %, unknown if currently breastfeeding.  Physical Exam:  General: alert, cooperative and no distress Chest: no respiratory distress Heart:regular rate, distal pulses intact Abdomen: soft, nontender,  Uterine Fundus: firm, appropriately tender DVT Evaluation: bilateral pitting edema without tenderness  Extremities: 1+ pitting edema Skin: warm, dry  Recent Labs    05/29/22 1829 05/30/22 0437  HGB 13.8 13.4  HCT 40.8 39.5    Assessment/Plan: Sharon Ward is a 26 y.o. [id number] s/p SVD at [redacted]w[redacted]d   PPD#1 - Doing well  Routine postpartum care  Contraception: outpatient paraguard  Feeding: breast  Dispo: Plan for discharge tomorrow.   LOS: 2 days   Lowry Ram, MD  PGY-1, [facility]  05/30/2022, 8:57 AM

## 2022-05-30 NOTE — Anesthesia Postprocedure Evaluation (Signed)
Anesthesia Post Note  Patient: Sharon Ward  Procedure(s) Performed: AN AD HOC LABOR EPIDURAL     Patient location during evaluation: Mother Baby Anesthesia Type: Epidural Level of consciousness: awake and alert Pain management: pain level controlled Vital Signs Assessment: post-procedure vital signs reviewed and stable Respiratory status: spontaneous breathing, nonlabored ventilation and respiratory function stable Cardiovascular status: stable Postop Assessment: no headache, no backache, epidural receding, no apparent nausea or vomiting, patient able to bend at knees, adequate PO intake and able to ambulate Anesthetic complications: no   No notable events documented.  Last Vitals:  Vitals:   05/30/22 0300 05/30/22 0729  BP: 134/71 (!) 119/57  Pulse: 74 77  Resp: 20 18  Temp: 36.6 C 36.7 C  SpO2: 98% 97%    Last Pain:  Vitals:   05/30/22 0729  TempSrc: Oral  PainSc:    Pain Goal:                   Sharon Ward

## 2022-05-31 ENCOUNTER — Ambulatory Visit: Payer: Self-pay

## 2022-05-31 MED ORDER — AMLODIPINE BESYLATE 5 MG PO TABS: 5.0000 mg | ORAL_TABLET | Freq: Every day | ORAL | 1 refills | Status: DC

## 2022-05-31 MED ORDER — FUROSEMIDE 20 MG PO TABS: 20.0000 mg | ORAL_TABLET | Freq: Every day | ORAL | 0 refills | Status: DC

## 2022-05-31 NOTE — Lactation Note (Signed)
This note was copied from a baby's chart. Lactation Consultation Note  [name] OFBPZ'W Date: 05/31/2022 Reason for consult: Follow-up assessment Age:24 hours  P1, Answered questions. Encouraged breastfeeding before formula. Feed on demand with cues.  Goal 8-12+ times per day after first 24 hrs.  Place baby STS if not cueing.  Reviewed engorgement care and monitoring voids/stools.    Feeding Mother's Current Feeding Choice: Breast Milk and Formula  Interventions Interventions: Education  Discharge Discharge Education: Engorgement and breast care;Warning signs for feeding baby  Consult Status Consult Status: Complete Date: 05/31/22    Vivianne Master [facility] 05/31/2022, 1:58 PM

## 2022-05-31 NOTE — Social Work (Signed)
CSW spoke with [facility] CPS intake worker Malachy Chamber, report screened out, No barriers to discharge.   Letta Kocher, [place] Social Worker [phone]

## 2022-05-31 NOTE — BH Specialist Note (Unsigned)
Integrated Behavioral Health via Telemedicine Visit  05/31/2022 Sharon Ward [id number]  Pt has ACT Team (therapist, psychiatrist, case manager, etc.), involved in [facility] and other agency supports. No concern with mood at this time/ five days postpartum.  Pt is concerned with swelling in legs, from knees to feet, with "big lump on feet and ankles", uncomfortable and cannot move her toes. No headache.   Caroleen Hamman Kaiser Belluomini, LCSW

## 2022-06-02 ENCOUNTER — Encounter: Payer: Self-pay | Admitting: Certified Nurse Midwife

## 2022-06-03 ENCOUNTER — Ambulatory Visit: Payer: Medicaid Other | Admitting: Clinical

## 2022-06-03 DIAGNOSIS — Z7189 Other specified counseling: Secondary | ICD-10-CM

## 2022-06-03 NOTE — Patient Instructions (Signed)
[facility] [address] [place], [place] [address] [phone] (main office) [phone] (Edelyn Heidel's office)  New Parent Support Groups [email or web address]   

## 2022-06-04 ENCOUNTER — Ambulatory Visit (INDEPENDENT_AMBULATORY_CARE_PROVIDER_SITE_OTHER): Payer: Medicaid Other

## 2022-06-04 DIAGNOSIS — Z0130 Encounter for examination of blood pressure without abnormal findings: Secondary | ICD-10-CM

## 2022-06-04 NOTE — Patient Instructions (Signed)
Take 800mg  Ibuprofen  Use ice packs on bottom Dermoplast Tucks Pads

## 2022-06-04 NOTE — Progress Notes (Signed)
Blood Pressure Check Visit  Sharon Ward is here for blood pressure check following spontaneous vaginal on 05/29/22. BP today is 114/59. Pt was prescribed Lasix and amlodipine on 05/31/22. Pt states she has not taken medication as she did not know it was sent to pharmacy.  Patient denies any headache, dizziness, blurred vision, shortness of breath peripheral edema. Reviewed with Sharon Ishikawa MD. He advised that pt could continue without medication as blood pressure was within normal range.   Pt also stated she was having some vaginal swelling from delivery. RN educated pt on making ice packs and using dermoplast for swelling. Pt verbalized understanding.   Darlyne Russian, RN 06/04/2022  10:28 AM

## 2022-06-08 ENCOUNTER — Encounter: Payer: Self-pay | Admitting: Advanced Practice Midwife

## 2022-06-22 ENCOUNTER — Telehealth: Payer: Self-pay

## 2022-06-22 NOTE — Telephone Encounter (Signed)
Pt returned call and stated that she was seen last week and did not get offered [facility].  I explained to the pt that she was seen by a nurse and her pp visit is on 07/09/22.  I encouraged pt to speak friends/family about different BC so that she can come to her appt with questions and help pick the best [facility].  Pt verbalized understanding with no further questions.   Sarita Hakanson,RN  06/22/22

## 2022-06-22 NOTE — Telephone Encounter (Signed)
Pt requested to have a phone call from the office to schedule her for birth control.  Left message for pt to return call.   [name], RN  06/22/22

## 2022-07-01 ENCOUNTER — Ambulatory Visit (HOSPITAL_COMMUNITY)
Admission: RE | Admit: 2022-07-01 | Discharge: 2022-07-01 | Disposition: A | Discharge status: Home / Self Care | Payer: Medicaid Other | Attending: Psychiatry | Admitting: Psychiatry
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
## 2022-07-09 ENCOUNTER — Other Ambulatory Visit (HOSPITAL_COMMUNITY)
Admission: RE | Admit: 2022-07-09 | Discharge: 2022-07-09 | Disposition: A | Discharge status: Home / Self Care | Payer: Medicaid Other | Source: Ambulatory Visit | Attending: Certified Nurse Midwife | Admitting: Certified Nurse Midwife

## 2022-07-09 ENCOUNTER — Encounter: Payer: Self-pay | Admitting: Certified Nurse Midwife

## 2022-07-09 ENCOUNTER — Ambulatory Visit (INDEPENDENT_AMBULATORY_CARE_PROVIDER_SITE_OTHER): Payer: Medicaid Other | Admitting: Certified Nurse Midwife

## 2022-07-09 DIAGNOSIS — B9689 Other specified bacterial agents as the cause of diseases classified elsewhere: Secondary | ICD-10-CM

## 2022-07-09 DIAGNOSIS — N898 Other specified noninflammatory disorders of vagina: Secondary | ICD-10-CM | POA: Diagnosis not present

## 2022-07-09 DIAGNOSIS — Z8759 Personal history of other complications of pregnancy, childbirth and the puerperium: Secondary | ICD-10-CM | POA: Diagnosis not present

## 2022-07-09 DIAGNOSIS — N760 Acute vaginitis: Secondary | ICD-10-CM | POA: Diagnosis not present

## 2022-07-09 LAB — POCT PREGNANCY, URINE: Preg Test, Ur: NEGATIVE

## 2022-07-09 NOTE — Progress Notes (Signed)
    Post Partum Visit Note  Sharon Ward is a 26 y.o. [phone] female who presents for a postpartum visit. She is 5 weeks 6 days postpartum following a normal spontaneous vaginal delivery.  I have fully reviewed the prenatal and intrapartum course. The delivery was at [redacted]w[redacted]d. Anesthesia: epidural. Postpartum course has been physically uncomplicated, she has experienced a resurgence of depression. Baby is doing well. Baby is feeding by bottle - formula . Bleeding no bleeding. Bowel function is normal. Bladder function is normal. Patient is not sexually active. Contraception method is  currently none, desires copper IUD but not today . Postpartum depression screening: declined (does not want it on file), but is under the care of a therapist, psychiatrist and full team of support.  Health Maintenance Due  Topic Date Due   COVID-19 Vaccine (1) Never done   PAP-Cervical Cytology Screening  05/23/2022   PAP SMEAR-Modifier  05/23/2022   The following portions of the patient's history were reviewed and updated as appropriate: allergies, current medications, past family history, past medical history, past social history, past surgical history, and problem list.  Review of Systems Pertinent items noted in HPI and remainder of comprehensive ROS otherwise negative.  Objective:  BP 114/67   Pulse 62   Wt 224 lb (101.6 kg)   Breastfeeding No   BMI 39.68 kg/m    General:  alert, cooperative, appears stated age, and no distress   Breasts:  normal  Lungs: Normal effort  Heart:  regular rate and rhythm  Abdomen: Soft, non-tender    Wound N/A  GU exam:  not indicated - pt self-collected swabs       Assessment:  1. Postpartum care and examination - Doing well physically, working with her team on emotional/mental recovery  2. History of gestational hypertension - BP normal today  Plan:   Essential components of care per ACOG recommendations:  1.  Mood and well being: Patient declined  depression screening today. Has local resources for support.  - Patient tobacco use? No.   - hx of drug use? No.    2. Infant care and feeding:  -Patient currently breastmilk feeding? No.  -Social determinants of health (SDOH) reviewed in EPIC. Has full support.  3. Sexuality, contraception and birth spacing - Patient does not want a pregnancy in the next year.  Desired family size is 2 children.  - Reviewed reproductive life planning. Reviewed contraceptive methods based on pt preferences and effectiveness.  Patient desired IUD or IUS today.   - Discussed birth spacing of 18 months  4. Sleep and fatigue -Encouraged family/partner/community support of 4 hrs of uninterrupted sleep to help with mood and fatigue  5. Physical Recovery  - Discussed patients delivery and complications. She describes her labor as good. - Patient had a Vaginal, no problems at delivery. Patient had bilateral periurethral lacerations that did not require repair. Perineal healing reviewed. Patient expressed understanding - Patient has urinary incontinence? No. - Patient is safe to resume physical and sexual activity  6.  Health Maintenance - HM due items addressed Yes - Last pap smear 2020 - with LSIL, unsure if f/u colpo done. Needs pap at IUD insertion, message sent to provider.  -Breast Cancer screening indicated? No.   7. Chronic Disease/Pregnancy Condition follow up: None - PCP follow up  Bernerd Limbo, CNM [facility] [employer], [facility]

## 2022-07-12 ENCOUNTER — Inpatient Hospital Stay (HOSPITAL_COMMUNITY)
Admission: AD | Admit: 2022-07-12 | Discharge: 2022-07-17 | DRG: 885 | Disposition: A | Discharge status: Home / Self Care | Payer: Medicaid Other | Attending: Psychiatry | Admitting: Psychiatry

## 2022-07-12 DIAGNOSIS — Z87891 Personal history of nicotine dependence: Secondary | ICD-10-CM

## 2022-07-12 DIAGNOSIS — F4312 Post-traumatic stress disorder, chronic: Secondary | ICD-10-CM | POA: Diagnosis present

## 2022-07-12 DIAGNOSIS — F411 Generalized anxiety disorder: Secondary | ICD-10-CM | POA: Diagnosis present

## 2022-07-12 DIAGNOSIS — R45851 Suicidal ideations: Secondary | ICD-10-CM | POA: Diagnosis present

## 2022-07-12 DIAGNOSIS — K5900 Constipation, unspecified: Secondary | ICD-10-CM | POA: Diagnosis present

## 2022-07-12 DIAGNOSIS — G4700 Insomnia, unspecified: Secondary | ICD-10-CM | POA: Diagnosis present

## 2022-07-12 DIAGNOSIS — Z87730 Personal history of (corrected) cleft lip and palate: Secondary | ICD-10-CM

## 2022-07-12 DIAGNOSIS — Z9152 Personal history of nonsuicidal self-harm: Secondary | ICD-10-CM

## 2022-07-12 DIAGNOSIS — F603 Borderline personality disorder: Secondary | ICD-10-CM | POA: Diagnosis present

## 2022-07-12 DIAGNOSIS — F319 Bipolar disorder, unspecified: Secondary | ICD-10-CM | POA: Diagnosis present

## 2022-07-12 DIAGNOSIS — Z79899 Other long term (current) drug therapy: Secondary | ICD-10-CM | POA: Diagnosis not present

## 2022-07-12 DIAGNOSIS — N390 Urinary tract infection, site not specified: Secondary | ICD-10-CM | POA: Diagnosis present

## 2022-07-12 DIAGNOSIS — F4310 Post-traumatic stress disorder, unspecified: Secondary | ICD-10-CM | POA: Diagnosis present

## 2022-07-12 DIAGNOSIS — F913 Oppositional defiant disorder: Secondary | ICD-10-CM | POA: Diagnosis present

## 2022-07-12 DIAGNOSIS — F3181 Bipolar II disorder: Secondary | ICD-10-CM | POA: Diagnosis present

## 2022-07-12 DIAGNOSIS — Z20822 Contact with and (suspected) exposure to covid-19: Secondary | ICD-10-CM | POA: Diagnosis present

## 2022-07-12 DIAGNOSIS — Z9151 Personal history of suicidal behavior: Secondary | ICD-10-CM

## 2022-07-12 DIAGNOSIS — Z818 Family history of other mental and behavioral disorders: Secondary | ICD-10-CM

## 2022-07-12 LAB — SARS CORONAVIRUS 2 BY RT PCR: SARS Coronavirus 2 by RT PCR: NEGATIVE

## 2022-07-12 LAB — CERVICOVAGINAL ANCILLARY ONLY
Bacterial Vaginitis (gardnerella): POSITIVE — AB
Candida Glabrata: NEGATIVE
Candida Vaginitis: NEGATIVE
Chlamydia: NEGATIVE
Comment: NEGATIVE
Comment: NEGATIVE
Comment: NEGATIVE
Comment: NEGATIVE
Comment: NEGATIVE
Comment: NORMAL
Neisseria Gonorrhea: NEGATIVE
Trichomonas: NEGATIVE

## 2022-07-12 MED ORDER — FLUOXETINE HCL 10 MG PO CAPS
10.0000 mg | ORAL_CAPSULE | Freq: Every day | ORAL | Status: DC
  Administered 2022-07-12 – 2022-07-17 (×6): 10 mg via ORAL
  Filled 2022-07-12 (×7): qty 1
  Filled 2022-07-12: qty 7

## 2022-07-12 MED ORDER — MAGNESIUM HYDROXIDE 400 MG/5ML PO SUSP: 30.0000 mL | Freq: Every day | ORAL | Status: DC | PRN

## 2022-07-12 MED ORDER — ALUM & MAG HYDROXIDE-SIMETH 200-200-20 MG/5ML PO SUSP: 30.0000 mL | ORAL | Status: DC | PRN

## 2022-07-12 MED ORDER — ACETAMINOPHEN 325 MG PO TABS: 650.0000 mg | ORAL_TABLET | Freq: Four times a day (QID) | ORAL | Status: DC | PRN

## 2022-07-12 MED ORDER — QUETIAPINE FUMARATE ER 50 MG PO TB24
50.0000 mg | ORAL_TABLET | Freq: Every day | ORAL | Status: DC
  Administered 2022-07-12: 50 mg via ORAL
  Filled 2022-07-12 (×4): qty 1

## 2022-07-12 MED ORDER — ZIPRASIDONE MESYLATE 20 MG IM SOLR: 20.0000 mg | INTRAMUSCULAR | Status: DC | PRN

## 2022-07-12 MED ORDER — RISPERIDONE 2 MG PO TBDP: 2.0000 mg | ORAL_TABLET | Freq: Three times a day (TID) | ORAL | Status: DC | PRN

## 2022-07-12 MED ORDER — TRAZODONE HCL 50 MG PO TABS
50.0000 mg | ORAL_TABLET | Freq: Every evening | ORAL | Status: DC | PRN
  Administered 2022-07-13 – 2022-07-16 (×4): 50 mg via ORAL
  Filled 2022-07-12: qty 1
  Filled 2022-07-12: qty 7
  Filled 2022-07-12 (×3): qty 1

## 2022-07-12 MED ORDER — NICOTINE 14 MG/24HR TD PT24
14.0000 mg | MEDICATED_PATCH | Freq: Every day | TRANSDERMAL | Status: DC
  Administered 2022-07-15: 14 mg via TRANSDERMAL
  Filled 2022-07-12 (×6): qty 1

## 2022-07-12 MED ORDER — LORAZEPAM 1 MG PO TABS: 1.0000 mg | ORAL_TABLET | ORAL | Status: DC | PRN

## 2022-07-12 MED ORDER — HYDROXYZINE HCL 25 MG PO TABS
25.0000 mg | ORAL_TABLET | Freq: Three times a day (TID) | ORAL | Status: DC | PRN
  Administered 2022-07-13: 25 mg via ORAL
  Filled 2022-07-12: qty 10
  Filled 2022-07-12: qty 1
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
## 2022-07-13 ENCOUNTER — Encounter (HOSPITAL_COMMUNITY): Payer: Self-pay | Admitting: Psychiatry

## 2022-07-13 ENCOUNTER — Other Ambulatory Visit: Payer: Self-pay

## 2022-07-13 LAB — URINALYSIS, COMPLETE (UACMP) WITH MICROSCOPIC
Bilirubin Urine: NEGATIVE
Glucose, UA: NEGATIVE mg/dL
Hgb urine dipstick: NEGATIVE
Ketones, ur: 5 mg/dL — AB
Nitrite: NEGATIVE
Protein, ur: 30 mg/dL — AB
Specific Gravity, Urine: 1.025 (ref 1.005–1.030)
Squamous Epithelial / HPF: >50 — ABNORMAL HIGH (ref 0–5)
WBC, UA: >50 WBC/hpf — ABNORMAL HIGH (ref 0–5)
pH: 5 (ref 5.0–8.0)

## 2022-07-13 LAB — CBC
HCT: 44.1 % (ref 36.0–46.0)
Hemoglobin: 14.4 g/dL (ref 12.0–15.0)
MCH: 30.5 pg (ref 26.0–34.0)
MCHC: 32.7 g/dL (ref 30.0–36.0)
MCV: 93.4 fL (ref 80.0–100.0)
Platelets: 189 10*3/uL (ref 150–400)
RBC: 4.72 MIL/uL (ref 3.87–5.11)
RDW: 13.2 % (ref 11.5–15.5)
WBC: 7.2 10*3/uL (ref 4.0–10.5)
nRBC: 0 % (ref 0.0–0.2)

## 2022-07-13 LAB — LIPID PANEL
Cholesterol: 206 mg/dL — ABNORMAL HIGH (ref 0–200)
HDL: 65 mg/dL (ref >40)
LDL Cholesterol: 129 mg/dL — ABNORMAL HIGH (ref 0–99)
Total CHOL/HDL Ratio: 3.2 RATIO
Triglycerides: 60 mg/dL (ref <150)
VLDL: 12 mg/dL (ref 0–40)

## 2022-07-13 LAB — TSH: TSH: 2.213 u[IU]/mL (ref 0.350–4.500)

## 2022-07-13 LAB — PREGNANCY, URINE: Preg Test, Ur: NEGATIVE

## 2022-07-13 MED ORDER — METRONIDAZOLE 500 MG PO TABS: 500.0000 mg | ORAL_TABLET | Freq: Two times a day (BID) | ORAL | 0 refills | Status: DC

## 2022-07-13 MED ORDER — TRIPLE ANTIBIOTIC 3.5-400-5000 EX OINT
1.0000 | TOPICAL_OINTMENT | Freq: Two times a day (BID) | CUTANEOUS | Status: DC
  Administered 2022-07-13 – 2022-07-15 (×4): 1 via TOPICAL
  Filled 2022-07-13 (×4): qty 1

## 2022-07-13 MED ORDER — ARIPIPRAZOLE 5 MG PO TABS
5.0000 mg | ORAL_TABLET | Freq: Every day | ORAL | Status: DC
  Administered 2022-07-13 – 2022-07-17 (×5): 5 mg via ORAL
  Filled 2022-07-13: qty 1
  Filled 2022-07-13: qty 7
  Filled 2022-07-13 (×6): qty 1
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
## 2022-07-13 NOTE — Plan of Care (Signed)
  Problem: Education: Goal: Emotional status will improve Outcome: Not Progressing Goal: Mental status will improve Outcome: Not Progressing   

## 2022-07-13 NOTE — Group Note (Signed)
LCSW Group Therapy Note   Group Date: 07/13/2022 Start Time: 1100 End Time: 1200  Type of Therapy and Topic: Group Therapy: Control  Participation Level: Active  Description of Group: In this group patients will discuss what is out of their control, what is somewhat in their control, and what is within their control.  They will be encouraged to explore what issues they can control and what issues are out of their control within their daily lives. They will be guided to discuss their thoughts, feelings, and behaviors related to these issues. The group will process together ways to better control things that are well within our own control and how to notice and accept the things that are not within our control. This group will be process-oriented, with patients participating in exploration of their own experiences as well as giving and receiving support and challenge from other group members.  During this group contains 3 worksheets will be provided to each patient to follow along and fill out.   Therapeutic Goals: 1. Patient will identify what is within their control and what is not within their control. 2. Patient will identify their thoughts and feelings about having control over their own lives. 3. Patient will identify their thoughts and feelings about not having control over everything in their lives. 4. Patient will identify ways that they can have more control over their own lives. 5. Patient will identify areas where they can allow others to help them or provide assistance.  Summary of Patient Progress: The Pt attended group and remained there the entire time.  The Pt accepted all worksheets and materials that were presented.  The Pt participated openly throughout the discussion and was able to identify areas where they can take control of their own life.  The Pt was appropriate with their peers and demonstrated an appropriate amount of self-control.   Aram Beecham, LCSWA 07/13/2022   1:05 PM

## 2022-07-13 NOTE — Progress Notes (Signed)
     07/12/22 2035  Psych Admission Type (Psych Patients Only)  Admission Status Voluntary  Psychosocial Assessment  Patient Complaints Anxiety;Crying spells;Depression;Appetite decrease;Self-harm behaviors;Insomnia  Eye Contact Fair  Facial Expression Flat  Affect Depressed  Speech Logical/coherent  Interaction Assertive  Motor Activity Other (Comment) (WDL)  Appearance/Hygiene Unremarkable  Behavior Characteristics Cooperative  Mood Depressed;Anxious  Thought Process  Coherency Circumstantial  Content WDL  Delusions None reported or observed  Perception WDL  Hallucination None reported or observed  Judgment Poor  Confusion None  Danger to Self  Current suicidal ideation? Passive  Agreement Not to Harm Self Yes  Description of Agreement verbal  Danger to Others  Danger to Others None reported or observed

## 2022-07-13 NOTE — Progress Notes (Signed)
  Urine cup provided to patient and specimen requested.

## 2022-07-13 NOTE — Addendum Note (Signed)
Addended by: Edd Arbour on: 07/13/2022 07:53 AM   Modules accepted: Orders

## 2022-07-13 NOTE — Tx Team (Signed)
Initial Treatment Plan 07/13/2022 2:38 AM Sharon Ward [id number]    PATIENT STRESSORS: Loss of relationship with father of her children   Other: Having 2 kids is overwhelming     PATIENT STRENGTHS: Capable of independent living  Music therapist for treatment/growth  Other: ACT Team   PATIENT IDENTIFIED PROBLEMS: Suicidal  Self-Harm  Depression  Anxiety      "Help with Coping Skills"  "Help to think positive about self image"       DISCHARGE CRITERIA:  Improved stabilization in mood, thinking, and/or behavior Reduction of life-threatening or endangering symptoms to within safe limits  PRELIMINARY DISCHARGE PLAN: Outpatient therapy Return to previous living arrangement  PATIENT/FAMILY INVOLVEMENT: This treatment plan has been presented to and reviewed with the patient, Sharon Ward.  The patient and family have been given the opportunity to ask questions and make suggestions.  Marcie Bal, RN 07/13/2022, 2:38 AM
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
## 2022-07-13 NOTE — BHH Suicide Risk Assessment (Addendum)
[facility] INPATIENT:  Family/Significant Other Suicide Prevention Education  Suicide Prevention Education:  Education Completed; Abby [phone] ([facility]otherapeutic Services ACTT Therapist) has been identified by the patient as the family member/significant other with whom the patient will be residing, and identified as the person(s) who will aid the patient in the event of a mental health crisis (suicidal ideations/suicide attempt).  With written consent from the patient, the family member/significant other has been provided the following suicide prevention education, prior to the and/or following the discharge of the patient.  The suicide prevention education provided includes the following: Suicide risk factors Suicide prevention and interventions National Suicide Hotline telephone number [facility] assessment telephone number [facility] Emergency Assistance 911 [facility] and/or Residential Mobile Crisis Unit telephone number  Request made of family/significant other to: Remove weapons (e.g., guns, rifles, knives), all items previously/currently identified as safety concern.   Remove drugs/medications (over-the-counter, prescriptions, illicit drugs), all items previously/currently identified as a safety concern.  The family member/significant other verbalizes understanding of the suicide prevention education information provided.  The family member/significant other agrees to remove the items of safety concern listed above.  CSW spoke with Abby who states that Ms. Tetro is having difficulty with her child's father and that "he is a trigger for her".  She states that Ms. Stretch "cannot regulate her emotions and has a lot of stress with managing co-parenting with the child's father".  She states that Ms. Mertens has one child in DSS custody and is working towards being reunited with that child and one child who is living with her.  She states that Ms. Rounds has been  consistent with her medications and appointments but has complained to the ACT Team doctor "that the medications make her sleepy while she has the baby and she does miss some morning doses because of that".  Abby confirms that Ms. Murtagh does have her own apartment and can return there at discharge.  She states that Ms. Nesser has no firearms or weapons in the home.  She states that Ms. Gunderson participates in ACTT services on Wednesdays and Fridays at her home.  CSW completed SPE with Ms. Rayborn.   Metro Kung Imir Brumbach 07/13/2022, 1:15 PM
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
## 2022-07-13 NOTE — Progress Notes (Signed)
   07/13/22 0500  Sleep  Number of Hours 6.5
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
## 2022-07-13 NOTE — Progress Notes (Signed)
Pt presents with depressed affect and congruent mood. Pt compliant with medications. Pt skin assessed, no signs of infection from self injury cuts. Pt utilizing ointment on recent tattoos. Pt talks about missing her children and how part of her difficulty prior to admission was sleep disturbance. Q 15 minute checks ongoing

## 2022-07-13 NOTE — Progress Notes (Signed)
The patient rated her day as a 8 out of 10 since she is feeling depressed. Her goal for tomorrow is to have more positive thoughts about herself.

## 2022-07-14 ENCOUNTER — Encounter (HOSPITAL_COMMUNITY): Payer: Self-pay

## 2022-07-14 LAB — COMPREHENSIVE METABOLIC PANEL WITH GFR
ALT: 34 U/L (ref 0–44)
AST: 25 U/L (ref 15–41)
Albumin: 4.1 g/dL (ref 3.5–5.0)
Alkaline Phosphatase: 66 U/L (ref 38–126)
Anion gap: 8 (ref 5–15)
BUN: 11 mg/dL (ref 6–20)
CO2: 27 mmol/L (ref 22–32)
Calcium: 9.4 mg/dL (ref 8.9–10.3)
Chloride: 105 mmol/L (ref 98–111)
Creatinine, Ser: 0.8 mg/dL (ref 0.44–1.00)
GFR, Estimated: >60 mL/min
Glucose, Bld: 91 mg/dL (ref 70–99)
Potassium: 3.8 mmol/L (ref 3.5–5.1)
Sodium: 140 mmol/L (ref 135–145)
Total Bilirubin: 0.6 mg/dL (ref 0.3–1.2)
Total Protein: 7.1 g/dL (ref 6.5–8.1)

## 2022-07-14 LAB — URINALYSIS, ROUTINE W REFLEX MICROSCOPIC
Bilirubin Urine: NEGATIVE
Glucose, UA: NEGATIVE mg/dL
Hgb urine dipstick: NEGATIVE
Ketones, ur: NEGATIVE mg/dL
Nitrite: NEGATIVE
Protein, ur: NEGATIVE mg/dL
Specific Gravity, Urine: 1.021 (ref 1.005–1.030)
Squamous Epithelial / HPF: >50 — ABNORMAL HIGH (ref 0–5)
WBC, UA: >50 WBC/hpf — ABNORMAL HIGH (ref 0–5)
pH: 5 (ref 5.0–8.0)

## 2022-07-14 LAB — HCG, QUANTITATIVE, PREGNANCY: hCG, Beta Chain, Quant, S: <1 m[IU]/mL

## 2022-07-14 LAB — HEMOGLOBIN A1C
Hgb A1c MFr Bld: 5.4 % (ref 4.8–5.6)
Mean Plasma Glucose: 108 mg/dL

## 2022-07-14 MED ORDER — WHITE PETROLATUM EX OINT
TOPICAL_OINTMENT | CUTANEOUS | Status: AC
  Administered 2022-07-14: 1
  Filled 2022-07-14: qty 5

## 2022-07-14 NOTE — Group Note (Signed)
Recreation Therapy Group Note   Group Topic:Team Building  Group Date: 07/14/2022 Start Time: 0935 End Time: 1000 Facilitators: Virgilio Broadhead-McCall, LRT,CTRS Location: [address] Dayroom   Goal Area(s) Addresses:  Patient will effectively work with peer towards shared goal.  Patient will identify skills used to make activity successful.  Patient will identify how skills used during activity can be applied to reach post d/c goals.    Group Description: [employer]. In teams of 5-6, patients were given 11 craft pipe cleaners. Using the materials provided, patients were instructed to compete again the opposing team(s) to build the tallest free-standing structure from floor level. The activity was timed; difficulty increased by Clinical research associate as Production designer, theatre/television/film continued.  Systematically resources were removed with additional directions for example, placing one arm behind their back, working in silence, and shape stipulations. LRT facilitated post-activity discussion reviewing team processes and necessary communication skills involved in completion. Patients were encouraged to reflect how the skills utilized, or not utilized, in this activity can be incorporated to positively impact support systems post discharge.   Affect/Mood: Appropriate   Participation Level: Engaged   Participation Quality: Independent   Behavior: Appropriate   Speech/Thought Process: Focused   Insight: Good   Judgement: Good   Modes of Intervention: STEM Activity   Patient Response to Interventions:  Engaged   Education Outcome:  Acknowledges education and In group clarification offered    Clinical Observations/Individualized Feedback: Pt attended and participated in group session.    Plan: Continue to engage patient in RT group sessions 2-3x/week.   Ashari Llewellyn-McCall, LRT,CTRS 07/14/2022 11:08 AM

## 2022-07-14 NOTE — BH IP Treatment Plan (Signed)
Interdisciplinary Treatment and Diagnostic Plan Update  07/14/2022 Time of Session: 9:50am  Sharon Ward MRN: [id number]  Principal Diagnosis: Bipolar 2 disorder, major depressive episode ([place])  Secondary Diagnoses: Principal Problem:   Bipolar 2 disorder, major depressive episode ([place]) Active Problems:   Oppositional defiant disorder   Borderline personality disorder ([place])   PTSD (post-traumatic stress disorder)   GAD (generalized anxiety disorder)   Current Medications:  Current Facility-Administered Medications  Medication Dose Route Frequency Provider Last Rate Last Admin   acetaminophen (TYLENOL) tablet 650 mg  650 mg Oral Q6H PRN Ntuen, Kris Hartmann, FNP       alum & mag hydroxide-simeth (MAALOX/MYLANTA) 200-200-20 MG/5ML suspension 30 mL  30 mL Oral Q4H PRN Ntuen, Kris Hartmann, FNP       ARIPiprazole (ABILIFY) tablet 5 mg  5 mg Oral Daily Nicholes Rough, NP   5 mg at 07/14/22 0750   FLUoxetine (PROZAC) capsule 10 mg  10 mg Oral Daily Onuoha, Chinwendu V, NP   10 mg at 07/14/22 0750   hydrOXYzine (ATARAX) tablet 25 mg  25 mg Oral TID PRN Laretta Bolster, FNP   25 mg at 07/13/22 2139   risperiDONE (RISPERDAL M-TABS) disintegrating tablet 2 mg  2 mg Oral Q8H PRN Ntuen, Kris Hartmann, FNP       And   LORazepam (ATIVAN) tablet 1 mg  1 mg Oral PRN Ntuen, Kris Hartmann, FNP       And   ziprasidone (GEODON) injection 20 mg  20 mg Intramuscular PRN Ntuen, Kris Hartmann, FNP       magnesium hydroxide (MILK OF MAGNESIA) suspension 30 mL  30 mL Oral Daily PRN Ntuen, Kris Hartmann, FNP       neomycin-bacitracin-polymyxin [phone] OINT 1 Application  1 Application Topical BID Nicholes Rough, NP   1 Application at 35/36/14 0750   nicotine (NICODERM CQ - dosed in mg/24 hours) patch 14 mg  14 mg Transdermal Daily Onuoha, Chinwendu V, NP       traZODone (DESYREL) tablet 50 mg  50 mg Oral QHS PRN Ntuen, Kris Hartmann, FNP   50 mg at 07/13/22 2139   PTA Medications: Medications Prior to Admission  Medication Sig Dispense Refill Last  Dose   FLUoxetine (PROZAC) 20 MG capsule Take 20 mg by mouth daily.      hydrOXYzine (ATARAX) 25 MG tablet Take 25 mg by mouth 2 (two) times daily as needed for anxiety.      QUEtiapine (SEROQUEL) 25 MG tablet Take 75 mg by mouth at bedtime.       Patient Stressors: Loss of relationship with father of her children   Other: Having 2 kids is overwhelming    Patient Strengths: Capable of independent living  Agricultural engineer for treatment/growth  Other: ACT Team  Treatment Modalities: Medication Management, Group therapy, Case management,  1 to 1 session with clinician, Psychoeducation, Recreational therapy.   Physician Treatment Plan for Primary Diagnosis: Bipolar 2 disorder, major depressive episode ([place]) Long Term Goal(s): Improvement in symptoms so as ready for discharge   Short Term Goals: Ability to identify changes in lifestyle to reduce recurrence of condition will improve Ability to verbalize feelings will improve Ability to disclose and discuss suicidal ideas Ability to demonstrate self-control will improve Ability to identify and develop effective coping behaviors will improve Compliance with prescribed medications will improve  Medication Management: Evaluate patient's response, side effects, and tolerance of medication regimen.  Therapeutic Interventions: 1 to 1 sessions, Unit Group sessions and  Medication administration.  Evaluation of Outcomes: Not Met  Physician Treatment Plan for Secondary Diagnosis: Principal Problem:   Bipolar 2 disorder, major depressive episode ([place]) Active Problems:   Oppositional defiant disorder   Borderline personality disorder ([facility])   PTSD (post-traumatic stress disorder)   GAD (generalized anxiety disorder)  Long Term Goal(s): Improvement in symptoms so as ready for discharge   Short Term Goals: Ability to identify changes in lifestyle to reduce recurrence of condition will improve Ability to verbalize feelings will  improve Ability to disclose and discuss suicidal ideas Ability to demonstrate self-control will improve Ability to identify and develop effective coping behaviors will improve Compliance with prescribed medications will improve     Medication Management: Evaluate patient's response, side effects, and tolerance of medication regimen.  Therapeutic Interventions: 1 to 1 sessions, Unit Group sessions and Medication administration.  Evaluation of Outcomes: Not Met   RN Treatment Plan for Primary Diagnosis: Bipolar 2 disorder, major depressive episode ([place]) Long Term Goal(s): Knowledge of disease and therapeutic regimen to maintain health will improve  Short Term Goals: Ability to remain free from injury will improve, Ability to participate in decision making will improve, Ability to verbalize feelings will improve, Ability to disclose and discuss suicidal ideas, and Ability to identify and develop effective coping behaviors will improve  Medication Management: RN will administer medications as ordered by provider, will assess and evaluate patient's response and provide education to patient for prescribed medication. RN will report any adverse and/or side effects to prescribing provider.  Therapeutic Interventions: 1 on 1 counseling sessions, Psychoeducation, Medication administration, Evaluate responses to treatment, Monitor vital signs and CBGs as ordered, Perform/monitor CIWA, COWS, AIMS and Fall Risk screenings as ordered, Perform wound care treatments as ordered.  Evaluation of Outcomes: Not Met   LCSW Treatment Plan for Primary Diagnosis: Bipolar 2 disorder, major depressive episode ([place]) Long Term Goal(s): Safe transition to appropriate next level of care at discharge, Engage patient in therapeutic group addressing interpersonal concerns.  Short Term Goals: Engage patient in aftercare planning with referrals and resources, Increase social support, Increase emotional regulation,  Facilitate acceptance of mental health diagnosis and concerns, Identify triggers associated with mental health/substance abuse issues, and Increase skills for wellness and recovery  Therapeutic Interventions: Assess for all discharge needs, 1 to 1 time with Social worker, Explore available resources and support systems, Assess for adequacy in community support network, Educate family and significant other(s) on suicide prevention, Complete Psychosocial Assessment, Interpersonal group therapy.  Evaluation of Outcomes: Not Met   Progress in Treatment: Attending groups: Yes. Participating in groups: Yes. Taking medication as prescribed: Yes. Toleration medication: Yes. Family/Significant other contact made: Yes, individual(s) contacted:  [facility] ACT Team  Patient understands diagnosis: Yes. Discussing patient identified problems/goals with staff: Yes. Medical problems stabilized or resolved: Yes. Denies suicidal/homicidal ideation: Yes. Issues/concerns per patient self-inventory: No.   New problem(s) identified: No, Describe:  None   New Short Term/Long Term Goal(s): medication stabilization, elimination of SI thoughts, development of comprehensive mental wellness plan.   Patient Goals: Patient did not provide a goal   Discharge Plan or Barriers: Patient recently admitted. CSW will continue to follow and assess for appropriate referrals and possible discharge planning.   Reason for Continuation of Hospitalization: Anxiety Depression Medication stabilization Suicidal ideation  Estimated Length of Stay: 3 to 7 days   Last 3 [place] Suicide Severity Risk Score: Flowsheet Row Admission (Current) from OP Visit from 07/12/2022 in [facility] OP Visit  from 07/01/2022 in [facility] Admission (Discharged) from 05/28/2022 in [facility]S Mother Baby Unit  C-SSRS RISK CATEGORY High Risk Low Risk Low Risk       Last PHQ 2/9  Scores:    05/26/2022    3:44 PM 05/19/2022    4:30 PM 05/13/2022    2:04 PM  Depression screen PHQ 2/9  Decreased Interest 0 0 0  Down, Depressed, Hopeless 0 1 0  PHQ - 2 Score 0 1 0  Altered sleeping _0 Tired, decreased energy _1 Change in appetite _2 Feeling bad or failure about yourself  0 0 0  Trouble concentrating 0 0 0  Moving slowly or fidgety/restless 0 0 0  Suicidal thoughts 0 0 0  PHQ-9 Score _3 Scribe for Treatment Team: Darleen Crocker, Latanya Presser 07/14/2022 1:50 PM

## 2022-07-14 NOTE — Progress Notes (Addendum)
D: Pt denied SI/HI/AVH this morning. Pt rated her depression a 2/10, anxiety a 0/10, and feelings of hopelessness a 0/10. Pt reports that her goal for today is " I want to work on only talking positively about myself and my body". Pt has been pleasant, calm, and cooperative throughout the shift.   A: RN provided support and encouragement to patient. Pt given scheduled medications as prescribed. RN provided pt with urine sample cup. Urine collected from pt and placed in lab refrigerator. Q15 min checks verified for safety.    R: Patient verbally contracts for safety. Patient compliant with medications and treatment plan. Patient is interacting well on the unit. Pt is safe on the unit.   07/14/22 1234  Psych Admission Type (Psych Patients Only)  Admission Status Voluntary  Psychosocial Assessment  Patient Complaints Anxiety;Depression  Eye Contact Fair  Facial Expression Flat  Affect Depressed  Speech Logical/coherent  Interaction Assertive  Motor Activity Other (Comment) (WDL)  Appearance/Hygiene Unremarkable  Behavior Characteristics Cooperative  Mood Depressed;Anxious  Thought Process  Coherency WDL  Content WDL  Delusions None reported or observed  Perception WDL  Hallucination None reported or observed  Judgment Poor  Confusion None  Danger to Self  Current suicidal ideation? Denies  Agreement Not to Harm Self Yes  Description of Agreement VERBAL  Danger to Others  Danger to Others None reported or observed

## 2022-07-14 NOTE — Plan of Care (Signed)
  Problem: Education: Goal: Emotional status will improve Outcome: Not Progressing Goal: Mental status will improve Outcome: Not Progressing   

## 2022-07-14 NOTE — BHH Counselor (Signed)
CSW provided the Pt with a list of DV shelters, food, and financial resources that are located in the [place] area. CSW provided this to the Pt in a blue folder.

## 2022-07-14 NOTE — Progress Notes (Signed)
[facility] Note  07/14/2022 3:20 PM Sharon Ward  MRN:  [id number] Principal Problem: Bipolar 2 disorder, major depressive episode (HCC) Diagnosis: Principal Problem:   Bipolar 2 disorder, major depressive episode (HCC) Active Problems:   Oppositional defiant disorder   Borderline personality disorder (HCC)   PTSD (post-traumatic stress disorder)   GAD (generalized anxiety disorder)  eason for admission: Sharon Ward is a 26 yo Caucasian female with a past history of bipolar d/o, GAD, ODD & borderline personality d/o who [facility] with worsening depressive symptoms and suicidal ideations. She denied having a plan, but self inflicted superficial cuts to b/l thighs and right wrist prior to presenting at this hospital. Pt was admitted voluntarily for treatment and stabilization of her mood.   24 hr chart review: V/S for the past 24 hrs with periods of HR elevation. HR earlier today morning was 107, fluids are being encouraged. Sleep hours for last night are documented as 7.5 hrs. She is compliant with scheduled medications, and required PRN Trazodone 50 mg last night for insomnia and last took Atarax 25 mg last night for anxiety. She has been in attendance of most unit group sessions. No behavioral episodes noted in the past 24 hrs.  Patient assessment note (07/14/2022): Pt presents with depressed mood, and affect is congruent. She reports feeling less depressed, reports feeling less anxious. Her attention to personal hygiene and grooming is fair, eye contact is good, speech is clear & coherent. Thought contents are organized and logical, and pt currently denies SI/HI/AVH or paranoia. There is no evidence of delusional thoughts.    Pt reports that her sleep quality last night was good, and reports that her appetite is improving. She denies being in any physical distress, and states that she is tolerating current medications well with no medication related side effects. No  TD/EPS type symptoms found on assessment, and pt denies any feelings of stiffness. AIMS: 0. We are continuing medications as listed below and will reassess tomorrow morning.  Total Time spent with patient: 45 minutes  Past Psychiatric History: Bipolar d/o  Past Medical History:  Past Medical History:  Diagnosis Date   Asthma    Bipolar 1 disorder, mixed (HCC)    Depression    Generalized anxiety disorder    Intentional drug overdose (HCC) 11/03/2020   Low-lying placenta 01/07/2022   Resolved 03/04/22   Relationship dysfunction    Suicide attempt (HCC) 11/02/2020    Past Surgical History:  Procedure Laterality Date   PILONIDAL CYST / SINUS EXCISION  09/11/2013   PILONIDAL CYST EXCISION  05/17/2014   Pilonidal cystectomy with cleft lip   Family History:  Family History  Problem Relation Age of Onset   Asthma Mother    Diabetes Mother    Healthy Mother    Hypertension Father    Asthma Father    Diabetes Father    Healthy Father    Asthma Brother    Hypertension Paternal Uncle    Diabetes Paternal Grandmother    Stroke Paternal Grandfather    Heart disease Paternal Grandfather    Hypertension Paternal Grandfather    Diabetes Paternal Grandfather    Family Psychiatric  History: As above Social History:  Social History   Substance and Sexual Activity  Alcohol Use Not Currently   Comment: occasional prior to pregnancy     Social History   Substance and Sexual Activity  Drug Use Not Currently   Frequency: 4.0 times per week  Types: Marijuana   Comment: No Delta 9 since February 2023    Social History   Socioeconomic History   Marital status: Single    Spouse name: Not on file   Number of children: 1   Years of education: 10   Highest education level: 10th grade  Occupational History   Not on file  Tobacco Use   Smoking status: Former    Packs/day: 1.00    Years: 15.00    Total pack years: 15.00    Types: Cigarettes    Quit date: 07/14/2021    Years  since quitting: 1.0   Smokeless tobacco: Never   Tobacco comments:    only smoke a "couple" of cigarettes when stressed or anxious, socially with friends per [facility] chart  Vaping Use   Vaping Use: Former   Start date: 08/10/2003   Quit date: 05/04/2021  Substance and Sexual Activity   Alcohol use: Not Currently    Comment: occasional prior to pregnancy   Drug use: Not Currently    Frequency: 4.0 times per week    Types: Marijuana    Comment: No Delta 9 since February 2023   Sexual activity: Not Currently    Partners: Male    Birth control/protection: None  Other Topics Concern   Not on file  Social History Narrative   Not on file   Social Determinants of Health   Financial Resource Strain: Not on file  Food Insecurity: Food Insecurity Present (05/29/2022)   Hunger Vital Sign    Worried About [place] in the Last Year: Sometimes true    Ran Out of Food in the Last Year: Never true  Transportation Needs: No Transportation Needs (05/29/2022)   PRAPARE - Hydrologist (Medical): No    Lack of Transportation (Non-Medical): No  Physical Activity: Not on file  Stress: Not on file  Social Connections: Not on file   Additional Social History:   Sleep: Good  Appetite:  Fair  Current Medications: Current Facility-Administered Medications  Medication Dose Route Frequency Provider Last Rate Last Admin   acetaminophen (TYLENOL) tablet 650 mg  650 mg Oral Q6H PRN Ntuen, Kris Hartmann, FNP       alum & mag hydroxide-simeth (MAALOX/MYLANTA) 200-200-20 MG/5ML suspension 30 mL  30 mL Oral Q4H PRN Ntuen, Kris Hartmann, FNP       ARIPiprazole (ABILIFY) tablet 5 mg  5 mg Oral Daily Roslynn Holte, NP   5 mg at 07/14/22 0750   FLUoxetine (PROZAC) capsule 10 mg  10 mg Oral Daily Onuoha, Chinwendu V, NP   10 mg at 07/14/22 0750   hydrOXYzine (ATARAX) tablet 25 mg  25 mg Oral TID PRN Laretta Bolster, FNP   25 mg at 07/13/22 2139   risperiDONE (RISPERDAL M-TABS) disintegrating  tablet 2 mg  2 mg Oral Q8H PRN Ntuen, Kris Hartmann, FNP       And   LORazepam (ATIVAN) tablet 1 mg  1 mg Oral PRN Ntuen, Kris Hartmann, FNP       And   ziprasidone (GEODON) injection 20 mg  20 mg Intramuscular PRN Ntuen, Kris Hartmann, FNP       magnesium hydroxide (MILK OF MAGNESIA) suspension 30 mL  30 mL Oral Daily PRN Ntuen, Kris Hartmann, FNP       neomycin-bacitracin-polymyxin [id number] OINT 1 Application  1 Application Topical BID Nicholes Rough, NP   1 Application at [id number]   nicotine (NICODERM CQ - dosed  in mg/24 hours) patch 14 mg  14 mg Transdermal Daily Onuoha, Chinwendu V, NP       traZODone (DESYREL) tablet 50 mg  50 mg Oral QHS PRN Ntuen, Jesusita Oka, FNP   50 mg at 07/13/22 2139    Lab Results:  Results for orders placed or performed during the hospital encounter of 07/12/22 (from the past 48 hour(s))  SARS Coronavirus 2 by RT PCR (hospital order, performed in [facility] hospital lab) *cepheid single result test* Anterior Nasal Swab     Status: None   Collection Time: 07/12/22  7:10 PM   Specimen: Anterior Nasal Swab  Result Value Ref Range   SARS Coronavirus 2 by RT PCR NEGATIVE NEGATIVE    Comment: (NOTE) SARS-CoV-2 target nucleic acids are NOT DETECTED.  The SARS-CoV-2 RNA is generally detectable in upper and lower respiratory specimens during the acute phase of infection. The lowest concentration of SARS-CoV-2 viral copies this assay can detect is 250 copies / mL. A negative result does not preclude SARS-CoV-2 infection and should not be used as the sole basis for treatment or other patient management decisions.  A negative result may occur with improper specimen collection / handling, submission of specimen other than nasopharyngeal swab, presence of viral mutation(s) within the areas targeted by this assay, and inadequate number of viral copies (<250 copies / mL). A negative result must be combined with clinical observations, patient history, and epidemiological information.  Fact  Sheet for Patients:   RoadLapTop.co.za  Fact Sheet for Healthcare Providers: http://kim-miller.com/  This test is not yet approved or  cleared by the [place] FDA and has been authorized for detection and/or diagnosis of SARS-CoV-2 by FDA under an Emergency Use Authorization (EUA).  This EUA will remain in effect (meaning this test can be used) for the duration of the COVID-19 declaration under Section 564(b)(1) of the Act, 21 U.S.C. section 360bbb-3(b)(1), unless the authorization is terminated or revoked sooner.  Performed at [facility], [address] [place], [place] [address]   CBC     Status: None   Collection Time: 07/13/22  6:44 AM  Result Value Ref Range   WBC 7.2 4.0 - 10.5 K/uL   RBC 4.72 3.87 - 5.11 MIL/uL   Hemoglobin 14.4 12.0 - 15.0 g/dL   HCT 24.4 01.0 - 27.2 %   MCV 93.4 80.0 - 100.0 fL   MCH 30.5 26.0 - 34.0 pg   MCHC 32.7 30.0 - 36.0 g/dL   RDW 53.6 64.4 - 03.4 %   Platelets 189 150 - 400 K/uL   nRBC 0.0 0.0 - 0.2 %    Comment: Performed at [facility], [address] [place], [place] [address]  Hemoglobin A1c     Status: None   Collection Time: 07/13/22  6:44 AM  Result Value Ref Range   Hgb A1c MFr Bld 5.4 4.8 - 5.6 %    Comment: (NOTE)         Prediabetes: 5.7 - 6.4         Diabetes: >6.4         Glycemic control for adults with diabetes: <7.0    Mean Plasma Glucose 108 mg/dL    Comment: (NOTE) Performed At: [facility] [address] [place], [place] [id number] Jolene Schimke MD PI:[id number]   Lipid panel     Status: Abnormal   Collection Time: 07/13/22  6:44 AM  Result Value Ref Range   Cholesterol 206 (H) 0 -  200 mg/dL   Triglycerides 60 <616 mg/dL   HDL 65 >83 mg/dL   Total CHOL/HDL Ratio 3.2 RATIO   VLDL 12 0 - 40 mg/dL   LDL Cholesterol 729 (H) 0 - 99 mg/dL    Comment:        Total Cholesterol/HDL:CHD Risk Coronary Heart Disease  Risk Table                     Men   Women  1/2 Average Risk   3.4   3.3  Average Risk       5.0   4.4  2 X Average Risk   9.6   7.1  3 X Average Risk  23.4   11.0        Use the calculated Patient Ratio above and the CHD Risk Table to determine the patient's CHD Risk.        ATP III CLASSIFICATION (LDL):  <100     mg/dL   Optimal  021-115  mg/dL   Near or Above                    Optimal  130-159  mg/dL   Borderline  520-802  mg/dL   High  >233     mg/dL   Very High Performed at [facility], [address] [place], [place] [address]   TSH     Status: None   Collection Time: 07/13/22  6:44 AM  Result Value Ref Range   TSH 2.213 0.350 - 4.500 uIU/mL    Comment: Performed by a 3rd Generation assay with a functional sensitivity of <=0.01 uIU/mL. Performed at [facility], [address] [place], [place] [address]   Urinalysis, Complete w Microscopic Urine, Clean Catch     Status: Abnormal   Collection Time: 07/13/22  5:44 PM  Result Value Ref Range   Color, Urine YELLOW YELLOW   APPearance TURBID (A) CLEAR   Specific Gravity, Urine 1.025 1.005 - 1.030   pH 5.0 5.0 - 8.0   Glucose, UA NEGATIVE NEGATIVE mg/dL   Hgb urine dipstick NEGATIVE NEGATIVE   Bilirubin Urine NEGATIVE NEGATIVE   Ketones, ur 5 (A) NEGATIVE mg/dL   Protein, ur 30 (A) NEGATIVE mg/dL   Nitrite NEGATIVE NEGATIVE   Leukocytes,Ua MODERATE (A) NEGATIVE   RBC / HPF 11-20 0 - 5 RBC/hpf   WBC, UA >50 (H) 0 - 5 WBC/hpf   Bacteria, UA MANY (A) NONE SEEN   Squamous Epithelial / LPF >50 (H) 0 - 5   Mucus PRESENT     Comment: Performed at [facility], [address] [place], [place] [address]  Pregnancy, urine     Status: None   Collection Time: 07/13/22  5:45 PM  Result Value Ref Range   Preg Test, Ur NEGATIVE NEGATIVE    Comment:        THE SENSITIVITY OF THIS METHODOLOGY IS >20 mIU/mL. Performed at [facility], [address] [place], [place] [address]   Comprehensive metabolic panel     Status: None   Collection Time: 07/14/22  6:26 AM  Result Value Ref Range   Sodium 140 135 - 145 mmol/L   Potassium 3.8 3.5 - 5.1 mmol/L   Chloride 105 98 - 111 mmol/L   CO2 27 22 - 32 mmol/L   Glucose, Bld 91 70 - 99 mg/dL    Comment: Glucose reference range applies only to  samples taken after fasting for at least 8 hours.   BUN 11 6 - 20 mg/dL   Creatinine, Ser 0.80 0.44 - 1.00 mg/dL   Calcium 9.4 8.9 - 10.3 mg/dL   Total Protein 7.1 6.5 - 8.1 g/dL   Albumin 4.1 3.5 - 5.0 g/dL   AST 25 15 - 41 U/L   ALT 34 0 - 44 U/L   Alkaline Phosphatase 66 38 - 126 U/L   Total Bilirubin 0.6 0.3 - 1.2 mg/dL   GFR, Estimated >60 >60 mL/min    Comment: (NOTE) Calculated using the CKD-EPI Creatinine Equation (2021)    Anion gap 8 5 - 15    Comment: Performed at [facility], [place] [address], [place], [place] [address]  hCG, quantitative, pregnancy     Status: None   Collection Time: 07/14/22  6:26 AM  Result Value Ref Range   hCG, Beta Chain, Quant, S <1 <5 mIU/mL    Comment:          GEST. AGE      CONC.  (mIU/mL)   <=1 WEEK        5 - 50     2 WEEKS       50 - 500     3 WEEKS       100 - 10,000     4 WEEKS     1,000 - 30,000     5 WEEKS     3,500 - 115,000   6-8 WEEKS     12,000 - 270,000    12 WEEKS     15,000 - 220,000        FEMALE AND NON-PREGNANT FEMALE:     LESS THAN 5 mIU/mL Performed at [facility], [place] [address], [place], [place] [address]     Blood Alcohol level:  Lab Results  Component Value Date   ETH <10 11/10/2021   ETH <10 99991111    Metabolic Disorder Labs: Lab Results  Component Value Date   HGBA1C 5.4 07/13/2022   MPG 108 07/13/2022   MPG 91 11/10/2021   Lab Results  Component Value Date   PROLACTIN 48.1 (H) 11/10/2021   Lab Results  Component Value Date   CHOL 206 (H) 07/13/2022   TRIG 60 07/13/2022   HDL 65 07/13/2022   CHOLHDL 3.2  07/13/2022   VLDL 12 07/13/2022   LDLCALC 129 (H) 07/13/2022   LDLCALC 93 11/10/2021   Physical Findings: AIMS:0  CIWA:  n/a COWS: n/a  Musculoskeletal: Strength & Muscle Tone: within normal limits Gait & Station: normal Patient leans: N/A  Psychiatric Specialty Exam:  Presentation  General Appearance:  Appropriate for Environment; Fairly Groomed  Eye Contact: Good  Speech: Clear and Coherent  Speech Volume: Normal  Handedness: Right   Mood and Affect  Mood: Euthymic  Affect: Congruent   Thought Process  Thought Processes: Coherent  Descriptions of Associations:Intact  Orientation:Full (Time, Place and Person)  Thought Content:Logical  History of Schizophrenia/Schizoaffective disorder:No  Duration of Psychotic Symptoms:N/A  Hallucinations:Hallucinations: None  Ideas of Reference:None  Suicidal Thoughts:Suicidal Thoughts: No  Homicidal Thoughts:Homicidal Thoughts: No   Sensorium  Memory: Immediate Good  Judgment: Fair  Insight: Fair  Materials engineer: Fair  Attention Span: Fair  Recall: [employer] of Knowledge: Fair  Language: Fair  Psychomotor Activity  Psychomotor Activity: Psychomotor Activity: Normal  Assets  Assets: Communication Skills; Housing  Sleep  Sleep: Sleep: Fair  Physical Exam: Physical Exam  Review of Systems  Constitutional:  Negative for fever.  HENT: Negative.  Negative for sore throat.   Eyes: Negative.   Respiratory: Negative.  Negative for cough.   Cardiovascular: Negative.   Gastrointestinal:  Negative for heartburn.  Genitourinary:  Negative for dysuria.  Musculoskeletal:  Negative for myalgias.  Skin: Negative.   Neurological: Negative.   Psychiatric/Behavioral:  Positive for depression. Negative for hallucinations, memory loss, substance abuse and suicidal ideas. The patient has insomnia. The patient is not nervous/anxious.    Blood pressure 125/64, pulse (!)  107, temperature 97.6 F (36.4 C), temperature source Oral, resp. rate 16, height 5\' 3"  (1.6 m), weight 98 kg, SpO2 99 %, not currently breastfeeding. Body mass index is 38.26 kg/m.  Treatment Plan Summary: Daily contact with patient to assess and evaluate symptoms and progress in treatment and Medication management   Observation Level/Precautions:  15 minute checks  Laboratory:  Labs reviewed   Psychotherapy:  Unit Group sessions  Medications:  See [facility]  Consultations:  To be determined   Discharge Concerns:  Safety, medication compliance, mood stability  Estimated LOS: 5-7 days  Other:  N/A    Labs Reviewed on 07/13/2022: EKG with Qtc-450. Will repeat EKG in 1-2 days. CMP from 10/20 WNL. Repeated CMP and it is WNL. Lipid panel with cholesterol slightly elevated at 206 & LDL slightly elevated at 129. CBC WNL. TSH WNL. HA1C WNL. baseline UA with moderate leukocytes and many bacteria. Will repeat UA.  Beta HCG <1.   PLAN Safety and Monitoring: Voluntary admission to inpatient psychiatric unit for safety, stabilization and treatment Daily contact with patient to assess and evaluate symptoms and progress in treatment Patient's case to be discussed in multi-disciplinary team meeting Observation Level : q15 minute checks Vital signs: q12 hours Precautions:Safety   Long Term Goal(s): Improvement in symptoms so as ready for discharge   Short Term Goals: Ability to identify changes in lifestyle to reduce recurrence of condition will improve, Ability to verbalize feelings will improve, Ability to disclose and discuss suicidal ideas, Ability to demonstrate self-control will improve, Ability to identify and develop effective coping behaviors will improve, and Compliance with prescribed medications will improve   Diagnoses  Principal Problem:   Bipolar 2 disorder, major depressive episode ([place]) Active Problems:   Oppositional defiant disorder   Borderline personality disorder (HCC)   PTSD  (post-traumatic stress disorder)   GAD (generalized anxiety disorder)   Medications -Previously discontinued home Seroquel XR 50 mg nightly (lack of effectiveness) -Continue Abilify 5 mg daily for mood stabilization -Continue Prozac 10 mg daily for depressive symptoms -Continue Hydroxyzine 25 mg every 6 hours PRN for anxiety -Continue Trazodone 50 mg PRN nightly  -Continue Risperidone/Ativan/Geodon PRN for agitation-See MAR -Continue Bacitracin to b/l upper thighs and R wrist for infection prophylaxis   Other PRNS -Continue Tylenol 650 mg every 6 hours PRN for mild pain -Continue Maalox 30 mg every 4 hrs PRN for indigestion -Continue Milk of Magnesia as needed every 6 hrs for constipation   Discharge Planning: Social work and case management to assist with discharge planning and identification of hospital follow-up needs prior to discharge Estimated LOS: 5-7 days Discharge Concerns: Need to establish a safety plan; Medication compliance and effectiveness Discharge Goals: Return home with outpatient referrals for mental health follow-up including medication management/psychotherapy  Nicholes Rough, NP 07/14/2022, 3:20 PM

## 2022-07-14 NOTE — Progress Notes (Signed)
     07/13/22 2139  Psych Admission Type (Psych Patients Only)  Admission Status Voluntary  Psychosocial Assessment  Patient Complaints Anxiety;Depression  Eye Contact Fair  Facial Expression Flat  Affect Depressed  Speech Logical/coherent  Interaction Assertive  Motor Activity Other (Comment) (WDL)  Appearance/Hygiene Unremarkable  Behavior Characteristics Cooperative  Mood Depressed;Anxious  Thought Process  Coherency WDL  Content WDL  Delusions None reported or observed  Perception WDL  Hallucination None reported or observed  Judgment Poor  Confusion None  Danger to Self  Current suicidal ideation? Denies  Agreement Not to Harm Self Yes  Description of Agreement verbal  Danger to Others  Danger to Others None reported or observed

## 2022-07-14 NOTE — Progress Notes (Signed)
    07/14/22 0500  Sleep  Number of Hours 7.5

## 2022-07-15 DIAGNOSIS — N390 Urinary tract infection, site not specified: Secondary | ICD-10-CM | POA: Insufficient documentation

## 2022-07-15 DIAGNOSIS — F411 Generalized anxiety disorder: Secondary | ICD-10-CM

## 2022-07-15 MED ORDER — TRIPLE ANTIBIOTIC 3.5-400-5000 EX OINT
1.0000 | TOPICAL_OINTMENT | Freq: Two times a day (BID) | CUTANEOUS | Status: DC
  Administered 2022-07-15 – 2022-07-17 (×4): 1 via TOPICAL
  Filled 2022-07-15: qty 1

## 2022-07-15 MED ORDER — NITROFURANTOIN MONOHYD MACRO 100 MG PO CAPS
100.0000 mg | ORAL_CAPSULE | Freq: Two times a day (BID) | ORAL | Status: DC
  Administered 2022-07-15 – 2022-07-17 (×5): 100 mg via ORAL
  Filled 2022-07-15: qty 1
  Filled 2022-07-15: qty 10
  Filled 2022-07-15 (×5): qty 1
  Filled 2022-07-15: qty 10
  Filled 2022-07-15: qty 1

## 2022-07-15 NOTE — Group Note (Signed)
LCSW Group Therapy Note   Group Date: 07/15/2022 Start Time: 1100 End Time: 1200  Type of Therapy and Topic:  Group Therapy - Healthy vs Unhealthy Coping Skills  Participation Level:  Active   Description of Group The focus of this group was to determine what unhealthy coping techniques typically are used by group members and what healthy coping techniques would be helpful in coping with various problems. Patients were guided in becoming aware of the differences between healthy and unhealthy coping techniques. Patients were asked to identify 2-3 healthy coping skills they would like to learn to use more effectively.  Therapeutic Goals Patients learned that coping is what human beings do all day long to deal with various situations in their lives Patients defined and discussed healthy vs unhealthy coping techniques Patients identified their preferred coping techniques and identified whether these were healthy or unhealthy Patients determined 2-3 healthy coping skills they would like to become more familiar with and use more often. Patients provided support and ideas to each other   Summary of Patient Progress:  During group, patient expressed knowledge and understanding of what it means to cope with stress. Pt actively engaged in identifying unhealthy coping mechanisms they have utilized in the past. Pt actively engaged in processing means of coping and what outcomes occur from such methods. Pt further engaged in discussion, identifying healthy coping mechanisms they have used in the past. Pt engaged in processing the use of healthier mechanisms and how these produce different gains to unhealthy mechanisms. Pt actively identified other coping mechanisms they would be willing to try in the future. Pt proved receptive of alternate group members input and feedback from CSW.   Therapeutic Modalities Cognitive Behavioral Therapy Motivational Interviewing  Aram Beecham, [place] 07/15/2022  1:22  PM

## 2022-07-15 NOTE — Progress Notes (Signed)
   07/14/22 2215  Psych Admission Type (Psych Patients Only)  Admission Status Voluntary  Psychosocial Assessment  Patient Complaints None  Eye Contact Fair  Facial Expression Flat  Affect Appropriate to circumstance  Speech Logical/coherent  Interaction Assertive  Motor Activity Other (Comment) (wdl)  Appearance/Hygiene Unremarkable  Behavior Characteristics Cooperative;Appropriate to situation  Mood Pleasant  Thought Process  Coherency WDL  Content WDL  Delusions None reported or observed  Perception WDL  Hallucination None reported or observed  Judgment Poor  Confusion None  Danger to Self  Current suicidal ideation? Denies  Self-Injurious Behavior No self-injurious ideation or behavior indicators observed or expressed   Agreement Not to Harm Self Yes  Description of Agreement Verbally contracts for safety.  Danger to Others  Danger to Others None reported or observed   Patient alert and oriented. Presenting appropriate to circumstance with a pleasant mood. Patient denies SI, HI, AVH, and pain. Patient denies anxiety and depression at this time. PRN trazodone  administered to patient, per provider orders. Support and encouragement provided. Routine safety checks conducted every 15 minutes. Patient verbally contracts for safety. Patient receptive, calm, and cooperative and interacts well with others. Patient remains safe on the unit.

## 2022-07-15 NOTE — [facility] Group Notes (Signed)
[facility] Group Notes:  (Nursing/MHT/Case Management/Adjunct)  Date:  07/15/2022  Time:  9:07 PM  Type of Therapy:  Group Therapy  Participation Level:  Active  Participation Quality:  Appropriate  Affect:  Appropriate  Cognitive:  Appropriate  Insight:  Good  Engagement in Group:  Engaged  Modes of Intervention:  Discussion  Summary of Progress/Problems:  Sharon Ward 07/15/2022, 9:07 PM

## 2022-07-15 NOTE — Progress Notes (Signed)
   07/15/22 0539  Sleep  Number of Hours 5.5

## 2022-07-15 NOTE — Progress Notes (Addendum)
   07/15/22 2115  Psych Admission Type (Psych Patients Only)  Admission Status Voluntary  Psychosocial Assessment  Patient Complaints None  Eye Contact Fair  Facial Expression Flat  Affect Appropriate to circumstance  Speech Logical/coherent  Interaction Assertive  Motor Activity Other (Comment) (wnl)  Appearance/Hygiene Unremarkable  Behavior Characteristics Cooperative;Appropriate to situation  Mood Pleasant  Thought Process  Coherency WDL  Content WDL  Delusions None reported or observed  Perception WDL  Hallucination None reported or observed  Judgment Poor  Confusion None  Danger to Self  Current suicidal ideation? Denies  Danger to Others  Danger to Others None reported or observed   Progress note   D: Pt seen in dayroom. Pt denies SI, HI, AVH. Pt rates pain  0/10. Pt rates anxiety  0/10 and depression  0/10. Pt says that she feels better than she did when she came in. " I love to laugh and make other people laugh and I hadn't done that in a while. On the phone today, my ex said that I sounded better. Really, I feel better today too. They have me on Abilify and that lets me stay awake and tend to my baby. With the other medicine I was on, I was sleeping through her cries. I told the doctor to have the sleep medicine as needed so, when I go home, I can use it after I put her down to sleep at night." Pt asked if her med dosages had been increased but they are the same right now. Encouraged to speak to provider about any increases. Pt did ask for a different size of neosporin because the one-dose packets do not provide enough ointment to cover her superficial scars.   Pt states that when she went to her OB for follow up after her delivery, she had bacterial vaginosis. "The doctor said it's not uncommon after a delivery to get that kind of infection." Pt endorses smelly mucus discharge. No other concerns noted at this time.  A: Pt provided support and encouragement. Pt given  scheduled medication as prescribed. PRNs as appropriate. Q15 min checks for safety.   R: Pt safe on the unit. Will continue to monitor.

## 2022-07-15 NOTE — Progress Notes (Addendum)
D: Pt denied SI/HI/AVH this morning. Pt rated her depression a 0/10, anxiety a 0/10, and feelings of hopelessness a 0/10. Pt's blood pressure was low this morning 102/42, went up to 115/75 after retaken. RN encouraged pt to drink fluids throughout the day. Pt reports that she slept good last night. Pt seen interacting on the unit throughout the day and participating in groups. Pt has been pleasant, calm, and cooperative throughout the shift.   A: RN provided support and encouragement to patient. Pt given scheduled medications as prescribed. Q15 min checks verified for safety.    R: Patient verbally contracts for safety. Patient compliant with medications and treatment plan. Patient is interacting well on the unit. Pt is safe on the unit.   07/15/22 1047  Psych Admission Type (Psych Patients Only)  Admission Status Voluntary  Psychosocial Assessment  Patient Complaints None  Eye Contact Fair  Facial Expression Flat  Affect Depressed  Speech Logical/coherent  Interaction Assertive  Motor Activity Other (Comment) (WDL)  Appearance/Hygiene Unremarkable  Behavior Characteristics Cooperative;Appropriate to situation  Mood Pleasant  Thought Process  Coherency WDL  Content WDL  Delusions None reported or observed  Perception WDL  Hallucination None reported or observed  Judgment Poor  Confusion None  Danger to Self  Current suicidal ideation? Denies  Self-Injurious Behavior No self-injurious ideation or behavior indicators observed or expressed   Agreement Not to Harm Self Yes  Description of Agreement Verbal  Danger to Others  Danger to Others None reported or observed

## 2022-07-15 NOTE — Progress Notes (Signed)
[facility] MD Progress Note  07/15/2022 2:49 PM Clorinda Wyble  MRN:  [id number] Principal Problem: Bipolar 2 disorder, major depressive episode (HCC) Diagnosis: Principal Problem:   Bipolar 2 disorder, major depressive episode (HCC) Active Problems:   Oppositional defiant disorder   Borderline personality disorder (HCC)   PTSD (post-traumatic stress disorder)   GAD (generalized anxiety disorder)   UTI (urinary tract infection)  Reason for admission: Yashira Offenberger is a 26 yo Caucasian female with a past history of bipolar d/o, GAD, ODD & borderline personality d/o who walked into this [facility] on 12/4 with worsening depressive symptoms and suicidal ideations. She denied having a plan, but self inflicted superficial cuts to b/l thighs and right wrist prior to presenting at this hospital. Pt was admitted voluntarily for treatment and stabilization of her mood.   24 hr chart review: BP earlier today morning was low at 102/42, HR was 102. Both were repeated and are WNL.  Fluids are being encouraged. Sleep hours for last night are documented as 5.5 hrs. She is compliant with scheduled medications, and required PRN Trazodone 50 mg last night for insomnia. She has continued to be in attendance of most unit group sessions. No behavioral episodes noted in the past 24 hrs.  Patient assessment note (07/15/2022): Pt's mood today is less depressed, and affect is congruent. She also reports feeling less depressed and less anxious. Her attention to personal hygiene and grooming is fair, eye contact is good, speech is clear & coherent. Thought contents are organized and logical, and pt currently denies SI/HI/AVH or paranoia. There is no evidence of delusional thoughts.    Pt reports that her sleep quality last night was good, even though as per nursing flow sheets, she only slept for a total of 5.5 hrs. She reports that her appetite is continuing to improve. She denies being in any physical distress, and states that she is  continuing to tolerate current medications well with no medication related side effects. No TD/EPS type symptoms found on assessment, and pt denies any feelings of stiffness. AIMS: 0. Pt's UA with large amounts of bacteria and moderate leukocytes. She reports a large amount of yellow mucus like vaginal discharge. We are starting Macrobid 100 mg BID x 7 days for treatment of UTI. She reports not feelikng constipated, but states that she has not had a bowel movement since Sunday. Colace 100 mg daily ordered. We are continuing other medications as listed below .  Total Time spent with patient: 45 minutes  Past Psychiatric History: Bipolar d/o  Past Medical History:  Past Medical History:  Diagnosis Date   Asthma    Bipolar 1 disorder, mixed (HCC)    Depression    Generalized anxiety disorder    Intentional drug overdose (HCC) 11/03/2020   Low-lying placenta 01/07/2022   Resolved 03/04/22   Relationship dysfunction    Suicide attempt (HCC) 11/02/2020    Past Surgical History:  Procedure Laterality Date   PILONIDAL CYST / SINUS EXCISION  09/11/2013   PILONIDAL CYST EXCISION  05/17/2014   Pilonidal cystectomy with cleft lip   Family History:  Family History  Problem Relation Age of Onset   Asthma Mother    Diabetes Mother    Healthy Mother    Hypertension Father    Asthma Father    Diabetes Father    Healthy Father    Asthma Brother    Hypertension Paternal Uncle    Diabetes Paternal Grandmother    Stroke Paternal Grandfather  Heart disease Paternal Grandfather    Hypertension Paternal Grandfather    Diabetes Paternal Grandfather    Family Psychiatric  History: As above Social History:  Social History   Substance and Sexual Activity  Alcohol Use Not Currently   Comment: occasional prior to pregnancy     Social History   Substance and Sexual Activity  Drug Use Not Currently   Frequency: 4.0 times per week   Types: Marijuana   Comment: No Delta 9 since February 2023     Social History   Socioeconomic History   Marital status: Single    Spouse name: Not on file   Number of children: 1   Years of education: 10   Highest education level: 10th grade  Occupational History   Not on file  Tobacco Use   Smoking status: Former    Packs/day: 1.00    Years: 15.00    Total pack years: 15.00    Types: Cigarettes    Quit date: 07/14/2021    Years since quitting: 1.0   Smokeless tobacco: Never   Tobacco comments:    only smoke a "couple" of cigarettes when stressed or anxious, socially with friends per [facility]  Vaping Use   Vaping Use: Former   Start date: 08/10/2003   Quit date: 05/04/2021  Substance and Sexual Activity   Alcohol use: Not Currently    Comment: occasional prior to pregnancy   Drug use: Not Currently    Frequency: 4.0 times per week    Types: Marijuana    Comment: No Delta 9 since February 2023   Sexual activity: Not Currently    Partners: Male    Birth control/protection: None  Other Topics Concern   Not on file  Social History Narrative   Not on file   Social Determinants of Health   Financial Resource Strain: Not on file  Food Insecurity: Food Insecurity Present (05/29/2022)   Hunger Vital Sign    Worried About Running Out of Food in the Last Year: Sometimes true    Ran Out of Food in the Last Year: Never true  Transportation Needs: No Transportation Needs (05/29/2022)   PRAPARE - Administrator, Civil ServiceTransportation    Lack of Transportation (Medical): No    Lack of Transportation (Non-Medical): No  Physical Activity: Not on file  Stress: Not on file  Social Connections: Not on file   Additional Social History:   Sleep: Good  Appetite:  Fair  Current Medications: Current Facility-Administered Medications  Medication Dose Route Frequency Provider Last Rate Last Admin   acetaminophen (TYLENOL) tablet 650 mg  650 mg Oral Q6H PRN Ntuen, Jesusita Okaina C, FNP       alum & mag hydroxide-simeth (MAALOX/MYLANTA) 200-200-20 MG/5ML suspension 30 mL  30  mL Oral Q4H PRN Ntuen, Jesusita Okaina C, FNP       ARIPiprazole (ABILIFY) tablet 5 mg  5 mg Oral Daily Nancie Bocanegra, NP   5 mg at 07/15/22 0739   FLUoxetine (PROZAC) capsule 10 mg  10 mg Oral Daily Onuoha, Chinwendu V, NP   10 mg at 07/15/22 0739   hydrOXYzine (ATARAX) tablet 25 mg  25 mg Oral TID PRN Cecilie LowersNtuen, Tina C, FNP   25 mg at 07/13/22 2139   risperiDONE (RISPERDAL M-TABS) disintegrating tablet 2 mg  2 mg Oral Q8H PRN Ntuen, Jesusita Okaina C, FNP       And   LORazepam (ATIVAN) tablet 1 mg  1 mg Oral PRN Ntuen, Jesusita Okaina C, FNP  And   ziprasidone (GEODON) injection 20 mg  20 mg Intramuscular PRN Ntuen, Jesusita Oka, FNP       magnesium hydroxide (MILK OF MAGNESIA) suspension 30 mL  30 mL Oral Daily PRN Ntuen, Jesusita Oka, FNP       neomycin-bacitracin-polymyxin [phone] OINT 1 Application  1 Application Topical BID Massengill, Harrold Donath, MD       nicotine (NICODERM CQ - dosed in mg/24 hours) patch 14 mg  14 mg Transdermal Daily Onuoha, Chinwendu V, NP   14 mg at 07/15/22 0739   nitrofurantoin (macrocrystal-monohydrate) (MACROBID) capsule 100 mg  100 mg Oral Q12H Massengill, Harrold Donath, MD   100 mg at 07/15/22 1303   traZODone (DESYREL) tablet 50 mg  50 mg Oral QHS PRN Cecilie Lowers, FNP   50 mg at 07/14/22 2216    Lab Results:  Results for orders placed or performed during the hospital encounter of 07/12/22 (from the past 48 hour(s))  Urinalysis, Complete w Microscopic Urine, Clean Catch     Status: Abnormal   Collection Time: 07/13/22  5:44 PM  Result Value Ref Range   Color, Urine YELLOW YELLOW   APPearance TURBID (A) CLEAR   Specific Gravity, Urine 1.025 1.005 - 1.030   pH 5.0 5.0 - 8.0   Glucose, UA NEGATIVE NEGATIVE mg/dL   Hgb urine dipstick NEGATIVE NEGATIVE   Bilirubin Urine NEGATIVE NEGATIVE   Ketones, ur 5 (A) NEGATIVE mg/dL   Protein, ur 30 (A) NEGATIVE mg/dL   Nitrite NEGATIVE NEGATIVE   Leukocytes,Ua MODERATE (A) NEGATIVE   RBC / HPF 11-20 0 - 5 RBC/hpf   WBC, UA >50 (H) 0 - 5 WBC/hpf   Bacteria,  UA MANY (A) NONE SEEN   Squamous Epithelial / LPF >50 (H) 0 - 5   Mucus PRESENT     Comment: Performed at [facility], [address], [place], [place] [address]  Pregnancy, urine     Status: None   Collection Time: 07/13/22  5:45 PM  Result Value Ref Range   Preg Test, Ur NEGATIVE NEGATIVE    Comment:        THE SENSITIVITY OF THIS METHODOLOGY IS >20 mIU/mL. Performed at [facility], [address], [place], [place] [address]   Comprehensive metabolic panel     Status: None   Collection Time: 07/14/22  6:26 AM  Result Value Ref Range   Sodium 140 135 - 145 mmol/L   Potassium 3.8 3.5 - 5.1 mmol/L   Chloride 105 98 - 111 mmol/L   CO2 27 22 - 32 mmol/L   Glucose, Bld 91 70 - 99 mg/dL    Comment: Glucose reference range applies only to samples taken after fasting for at least 8 hours.   BUN 11 6 - 20 mg/dL   Creatinine, Ser 7.98 0.44 - 1.00 mg/dL   Calcium 9.4 8.9 - 92.1 mg/dL   Total Protein 7.1 6.5 - 8.1 g/dL   Albumin 4.1 3.5 - 5.0 g/dL   AST 25 15 - 41 U/L   ALT 34 0 - 44 U/L   Alkaline Phosphatase 66 38 - 126 U/L   Total Bilirubin 0.6 0.3 - 1.2 mg/dL   GFR, Estimated >19 >41 mL/min    Comment: (NOTE) Calculated using the CKD-EPI Creatinine Equation (2021)    Anion gap 8 5 - 15    Comment: Performed at [facility], [address], [place], [place] [address]  hCG, quantitative, pregnancy  Status: None   Collection Time: 07/14/22  6:26 AM  Result Value Ref Range   hCG, Beta Chain, Quant, S <1 <5 mIU/mL    Comment:          GEST. AGE      CONC.  (mIU/mL)   <=1 WEEK        5 - 50     2 WEEKS       50 - 500     3 WEEKS       100 - 10,000     4 WEEKS     1,000 - 30,000     5 WEEKS     3,500 - 115,000   6-8 WEEKS     12,000 - 270,000    12 WEEKS     15,000 - 220,000        FEMALE AND NON-PREGNANT FEMALE:     LESS THAN 5 mIU/mL Performed at [facility], [address], [place], [place]  [address]   Urinalysis, Routine w reflex microscopic Urine, Clean Catch     Status: Abnormal   Collection Time: 07/14/22  3:20 PM  Result Value Ref Range   Color, Urine YELLOW YELLOW   APPearance CLOUDY (A) CLEAR   Specific Gravity, Urine 1.021 1.005 - 1.030   pH 5.0 5.0 - 8.0   Glucose, UA NEGATIVE NEGATIVE mg/dL   Hgb urine dipstick NEGATIVE NEGATIVE   Bilirubin Urine NEGATIVE NEGATIVE   Ketones, ur NEGATIVE NEGATIVE mg/dL   Protein, ur NEGATIVE NEGATIVE mg/dL   Nitrite NEGATIVE NEGATIVE   Leukocytes,Ua LARGE (A) NEGATIVE   RBC / HPF 6-10 0 - 5 RBC/hpf   WBC, UA >50 (H) 0 - 5 WBC/hpf   Bacteria, UA MANY (A) NONE SEEN   Squamous Epithelial / LPF >50 (H) 0 - 5    Comment: Performed at [facility], [address] [place], [place] [address]    Blood Alcohol level:  Lab Results  Component Value Date   ETH <10 11/10/2021   ETH <10 05/03/2021    Metabolic Disorder Labs: Lab Results  Component Value Date   HGBA1C 5.4 07/13/2022   MPG 108 07/13/2022   MPG 91 11/10/2021   Lab Results  Component Value Date   PROLACTIN 48.1 (H) 11/10/2021   Lab Results  Component Value Date   CHOL 206 (H) 07/13/2022   TRIG 60 07/13/2022   HDL 65 07/13/2022   CHOLHDL 3.2 07/13/2022   VLDL 12 07/13/2022   LDLCALC 129 (H) 07/13/2022   LDLCALC 93 11/10/2021   Physical Findings: AIMS:0  CIWA:  n/a COWS: n/a  Musculoskeletal: Strength & Muscle Tone: within normal limits Gait & Station: normal Patient leans: N/A  Psychiatric Specialty Exam:  Presentation  General Appearance:  Appropriate for Environment  Eye Contact: Good  Speech: Clear and Coherent  Speech Volume: Normal  Handedness: Right   Mood and Affect  Mood: Euthymic  Affect: Congruent   Thought Process  Thought Processes: Coherent  Descriptions of Associations:Intact  Orientation:Full (Time, Place and Person)  Thought Content:WDL  History of Schizophrenia/Schizoaffective  disorder:No  Duration of Psychotic Symptoms:N/A  Hallucinations:Hallucinations: None  Ideas of Reference:None  Suicidal Thoughts:Suicidal Thoughts: No  Homicidal Thoughts:Homicidal Thoughts: No   Sensorium  Memory: Immediate Good  Judgment: Fair  Insight: Fair  Art therapist  Concentration: Fair  Attention Span: Good  Recall: Good  Fund of Knowledge: Good  Language: Good  Psychomotor Activity  Psychomotor Activity: Psychomotor Activity: Normal  Assets  Assets: Communication Skills  Sleep  Sleep: Sleep: Good  Physical Exam: Physical Exam Constitutional:      Appearance: Normal appearance.  HENT:     Nose: Nose normal.  Eyes:     Pupils: Pupils are equal, round, and reactive to light.  Musculoskeletal:     Cervical back: Normal range of motion.  Neurological:     Mental Status: She is alert and oriented to person, place, and time.    Review of Systems  Constitutional:  Negative for fever.  HENT: Negative.  Negative for sore throat.   Eyes: Negative.   Respiratory: Negative.  Negative for cough.   Cardiovascular: Negative.   Gastrointestinal:  Negative for heartburn.  Genitourinary:  Negative for dysuria.  Musculoskeletal:  Negative for myalgias.  Skin: Negative.   Neurological: Negative.   Psychiatric/Behavioral:  Positive for depression. Negative for hallucinations, memory loss, substance abuse and suicidal ideas. The patient has insomnia. The patient is not nervous/anxious.    Blood pressure 115/75, pulse 76, temperature 97.6 F (36.4 C), temperature source Oral, resp. rate 18, height 5\' 3"  (1.6 m), weight 98 kg, SpO2 99 %, not currently breastfeeding. Body mass index is 38.26 kg/m.  Treatment Plan Summary: Daily contact with patient to assess and evaluate symptoms and progress in treatment and Medication management   Observation Level/Precautions:  15 minute checks  Laboratory:  Labs reviewed   Psychotherapy:  Unit Group  sessions  Medications:  See [facility]  Consultations:  To be determined   Discharge Concerns:  Safety, medication compliance, mood stability  Estimated LOS: 5-7 days  Other:  N/A    Labs Reviewed on 07/13/2022: EKG with Qtc-450. Will repeat EKG in 1-2 days. CMP from 10/20 WNL. Repeated CMP and it is WNL. Lipid panel with cholesterol slightly elevated at 206 & LDL slightly elevated at 129. CBC WNL. TSH WNL. HA1C WNL. baseline UA with moderate leukocytes and many bacteria. Will repeat UA.  Beta HCG <1.   PLAN Safety and Monitoring: Voluntary admission to inpatient psychiatric unit for safety, stabilization and treatment Daily contact with patient to assess and evaluate symptoms and progress in treatment Patient's case to be discussed in multi-disciplinary team meeting Observation Level : q15 minute checks Vital signs: q12 hours Precautions:Safety   Long Term Goal(s): Improvement in symptoms so as ready for discharge   Short Term Goals: Ability to identify changes in lifestyle to reduce recurrence of condition will improve, Ability to verbalize feelings will improve, Ability to disclose and discuss suicidal ideas, Ability to demonstrate self-control will improve, Ability to identify and develop effective coping behaviors will improve, and Compliance with prescribed medications will improve   Diagnoses  Principal Problem:   Bipolar 2 disorder, major depressive episode (HCC) Active Problems:   Oppositional defiant disorder   Borderline personality disorder (HCC)   PTSD (post-traumatic stress disorder)   GAD (generalized anxiety disorder)   Medications -Previously discontinued home Seroquel XR 50 mg nightly (lack of effectiveness) -Continue Abilify 5 mg daily for mood stabilization -Continue Prozac 10 mg daily for depressive symptoms -Continue Hydroxyzine 25 mg every 6 hours PRN for anxiety -Continue Trazodone 50 mg PRN nightly  -Continue Risperidone/Ativan/Geodon PRN for agitation-See  MAR -Continue Bacitracin to b/l upper thighs and R wrist for infection prophylaxis -Start Colace 100 mg daily for constipation -Start Macrobid 100 mg BID x 7 days for UTI   Other PRNS -Continue Tylenol 650 mg every 6 hours PRN for mild pain -Continue Maalox 30 mg every 4 hrs  PRN for indigestion -Continue Milk of Magnesia as needed every 6 hrs for constipation   Discharge Planning: Social work and case management to assist with discharge planning and identification of hospital follow-up needs prior to discharge Estimated LOS: 5-7 days Discharge Concerns: Need to establish a safety plan; Medication compliance and effectiveness Discharge Goals: Return home with outpatient referrals for mental health follow-up including medication management/psychotherapy  Starleen Blue, NP 07/15/2022, 2:49 PMPatient ID: [name], female   DOB: [birth year], 26 y.o.   MRN: [id number]

## 2022-07-15 NOTE — Progress Notes (Signed)
   07/15/22 0646  Vitals  BP (!) 102/42  MAP (mmHg) (!) 57  BP Method Automatic  Pulse Rate (!) 103  Pulse Rate Source Monitor  MEWS COLOR  MEWS Score Color Green  Oxygen Therapy  SpO2 98 %  MEWS Score  MEWS Temp 0  MEWS Systolic 0  MEWS Pulse 1  MEWS RR 0  MEWS LOC 0  MEWS Score 1   Patient BP low, PR high. Patient denies symptoms of dizziness/lightheadedness at this time. Will recheck vitals. Patient provided fluids to drink. Patient educated on fall safety and to ensure care when changing positions.

## 2022-07-15 NOTE — Progress Notes (Addendum)
   07/15/22 0738  Vitals  BP 115/75  MAP (mmHg) 87  BP Method Automatic  Pulse Rate 76  Pulse Rate Source Monitor  MEWS COLOR  MEWS Score Color Green  Oxygen Therapy  SpO2 99 %  MEWS Score  MEWS Temp 0  MEWS Systolic 0  MEWS Pulse 0  MEWS RR 0  MEWS LOC 0  MEWS Score 0   Patient BP improved. Patient educated on fall safety and drinking fluids. Patient stated that when in the hospital in October for pregnancy she was prescribed medication for bp stabilization and has a hx of low bp. Vitals were rechecked and improved to 115/75 with pulse of 76. Reported to Erie Noe, Charity fundraiser and provider notified.

## 2022-07-16 NOTE — Progress Notes (Signed)
[facility] MD Progress Note  07/16/2022 2:20 PM Sharon Ward  MRN:  [id number] Principal Problem: Bipolar 2 disorder, major depressive episode (HCC) Diagnosis: Principal Problem:   Bipolar 2 disorder, major depressive episode (HCC) Active Problems:   Oppositional defiant disorder   Borderline personality disorder (HCC)   PTSD (post-traumatic stress disorder)   GAD (generalized anxiety disorder)   UTI (urinary tract infection)  Reason for admission: Sharon Ward is a 26 yo Caucasian female with a past history of bipolar d/o, GAD, ODD & borderline personality d/o who walked into this [facility] on 12/4 with worsening depressive symptoms and suicidal ideations. She denied having a plan, but self inflicted superficial cuts to b/l thighs and right wrist prior to presenting at this hospital. Pt was admitted voluntarily for treatment and stabilization of her mood.   24 hr chart review: V/S with HR elevated earlier today morning at 120, nursing asked to recheck it and it was WNL. Pt slept for a total of 6.25 hrs last night as per nursing documentation. She has remained compliant to all of her scheduled medications, and only PRN medication given last night was Trazodone 50 mg for insomnia last night. Over the past 24 hrs, pt has been visible on the unit, and has been interacting with peers appropriately and attending unit group sessions.   Patient assessment note (07/16/2022): Pt's mood today is euthymic, and affect is congruent. She continues to report feeling less depressed and less anxious as compared to time of hospitalization. Her attention to personal hygiene and grooming is good, eye contact is good, speech is clear & coherent. Thought contents are organized and logical, and pt currently denies SI/HI/AVH or paranoia. There is no evidence of delusional thoughts.    Pt continues to report that her sleep quality is good, and reports a good appetite. She denies being in any physical distress, and states that she  is continuing to tolerate current medications well with no medication related side effects. No TD/EPS type symptoms found on assessment, and pt denies any feelings of stiffness. AIMS: 0. Pt reports that the vaginal discharges that she has been having is less with the initiation of the antibiotic therapy yesterday. We will continue medications as listed below and plan to discharge pt tomorrow (12/9).  Total Time spent with patient: 45 minutes  Past Psychiatric History: Bipolar d/o  Past Medical History:  Past Medical History:  Diagnosis Date   Asthma    Bipolar 1 disorder, mixed (HCC)    Depression    Generalized anxiety disorder    Intentional drug overdose (HCC) 11/03/2020   Low-lying placenta 01/07/2022   Resolved 03/04/22   Relationship dysfunction    Suicide attempt (HCC) 11/02/2020    Past Surgical History:  Procedure Laterality Date   PILONIDAL CYST / SINUS EXCISION  09/11/2013   PILONIDAL CYST EXCISION  05/17/2014   Pilonidal cystectomy with cleft lip   Family History:  Family History  Problem Relation Age of Onset   Asthma Mother    Diabetes Mother    Healthy Mother    Hypertension Father    Asthma Father    Diabetes Father    Healthy Father    Asthma Brother    Hypertension Paternal Uncle    Diabetes Paternal Grandmother    Stroke Paternal Grandfather    Heart disease Paternal Grandfather    Hypertension Paternal Grandfather    Diabetes Paternal Grandfather    Family Psychiatric  History: As above Social History:  Social History  Substance and Sexual Activity  Alcohol Use Not Currently   Comment: occasional prior to pregnancy     Social History   Substance and Sexual Activity  Drug Use Not Currently   Frequency: 4.0 times per week   Types: Marijuana   Comment: No Delta 9 since February 2023    Social History   Socioeconomic History   Marital status: Single    Spouse name: Not on file   Number of children: 1   Years of education: 10   Highest  education level: 10th grade  Occupational History   Not on file  Tobacco Use   Smoking status: Former    Packs/day: 1.00    Years: 15.00    Total pack years: 15.00    Types: Cigarettes    Quit date: 07/14/2021    Years since quitting: 1.0   Smokeless tobacco: Never   Tobacco comments:    only smoke a "couple" of cigarettes when stressed or anxious, [facility]  Vaping Use   Vaping Use: Former   Start date: 08/10/2003   Quit date: 05/04/2021  Substance and Sexual Activity   Alcohol use: Not Currently    Comment: occasional prior to pregnancy   Drug use: Not Currently    Frequency: 4.0 times per week    Types: Marijuana    Comment: No Delta 9 since February 2023   Sexual activity: Not Currently    Partners: Male    Birth control/protection: None  Other Topics Concern   Not on file  Social History Narrative   Not on file   Social Determinants of Health   Financial Resource Strain: Not on file  Food Insecurity: Food Insecurity Present (05/29/2022)   Hunger Vital Sign    Worried About Running Out of Food in the Last Year: Sometimes true    Ran Out of Food in the Last Year: Never true  Transportation Needs: No Transportation Needs (05/29/2022)   PRAPARE - Administrator, Civil ServiceTransportation    Lack of Transportation (Medical): No    Lack of Transportation (Non-Medical): No  Physical Activity: Not on file  Stress: Not on file  Social Connections: Not on file   Additional Social History:   Sleep: Good  Appetite:  Fair  Current Medications: Current Facility-Administered Medications  Medication Dose Route Frequency Provider Last Rate Last Admin   acetaminophen (TYLENOL) tablet 650 mg  650 mg Oral Q6H PRN Ntuen, Jesusita Okaina C, FNP       alum & mag hydroxide-simeth (MAALOX/MYLANTA) 200-200-20 MG/5ML suspension 30 mL  30 mL Oral Q4H PRN Ntuen, Jesusita Okaina C, FNP       ARIPiprazole (ABILIFY) tablet 5 mg  5 mg Oral Daily Genie Wenke, NP   5 mg at 07/16/22 0733   FLUoxetine (PROZAC)  capsule 10 mg  10 mg Oral Daily Onuoha, Chinwendu V, NP   10 mg at 07/16/22 16100733   hydrOXYzine (ATARAX) tablet 25 mg  25 mg Oral TID PRN Cecilie LowersNtuen, Tina C, FNP   25 mg at 07/13/22 2139   risperiDONE (RISPERDAL M-TABS) disintegrating tablet 2 mg  2 mg Oral Q8H PRN Ntuen, Jesusita Okaina C, FNP       And   LORazepam (ATIVAN) tablet 1 mg  1 mg Oral PRN Ntuen, Jesusita Okaina C, FNP       And   ziprasidone (GEODON) injection 20 mg  20 mg Intramuscular PRN Ntuen, Jesusita Okaina C, FNP       magnesium hydroxide (MILK OF MAGNESIA) suspension 30 mL  30 mL Oral Daily PRN Ntuen, Jesusita Oka, FNP       neomycin-bacitracin-polymyxin [phone] OINT 1 Application  1 Application Topical BID Massengill, Harrold Donath, MD   1 Application at 07/16/22 0734   nicotine (NICODERM CQ - dosed in mg/24 hours) patch 14 mg  14 mg Transdermal Daily Onuoha, Chinwendu V, NP   14 mg at 07/15/22 0739   nitrofurantoin (macrocrystal-monohydrate) (MACROBID) capsule 100 mg  100 mg Oral Q12H Massengill, Harrold Donath, MD   100 mg at 07/16/22 4888   traZODone (DESYREL) tablet 50 mg  50 mg Oral QHS PRN Cecilie Lowers, FNP   50 mg at 07/15/22 2115    Lab Results:  Results for orders placed or performed during the hospital encounter of 07/12/22 (from the past 48 hour(s))  Urinalysis, Routine w reflex microscopic Urine, Clean Catch     Status: Abnormal   Collection Time: 07/14/22  3:20 PM  Result Value Ref Range   Color, Urine YELLOW YELLOW   APPearance CLOUDY (A) CLEAR   Specific Gravity, Urine 1.021 1.005 - 1.030   pH 5.0 5.0 - 8.0   Glucose, UA NEGATIVE NEGATIVE mg/dL   Hgb urine dipstick NEGATIVE NEGATIVE   Bilirubin Urine NEGATIVE NEGATIVE   Ketones, ur NEGATIVE NEGATIVE mg/dL   Protein, ur NEGATIVE NEGATIVE mg/dL   Nitrite NEGATIVE NEGATIVE   Leukocytes,Ua LARGE (A) NEGATIVE   RBC / HPF 6-10 0 - 5 RBC/hpf   WBC, UA >50 (H) 0 - 5 WBC/hpf   Bacteria, UA MANY (A) NONE SEEN   Squamous Epithelial / LPF >50 (H) 0 - 5    Comment: Performed at [facility],  [address] [place], [place] [address]    Blood Alcohol level:  Lab Results  Component Value Date   ETH <10 11/10/2021   ETH <10 05/03/2021    Metabolic Disorder Labs: Lab Results  Component Value Date   HGBA1C 5.4 07/13/2022   MPG 108 07/13/2022   MPG 91 11/10/2021   Lab Results  Component Value Date   PROLACTIN 48.1 (H) 11/10/2021   Lab Results  Component Value Date   CHOL 206 (H) 07/13/2022   TRIG 60 07/13/2022   HDL 65 07/13/2022   CHOLHDL 3.2 07/13/2022   VLDL 12 07/13/2022   LDLCALC 129 (H) 07/13/2022   LDLCALC 93 11/10/2021   Physical Findings: AIMS:0  CIWA:  n/a COWS: n/a  Musculoskeletal: Strength & Muscle Tone: within normal limits Gait & Station: normal Patient leans: N/A  Psychiatric Specialty Exam:  Presentation  General Appearance:  Appropriate for Environment; Fairly Groomed  Eye Contact: Good  Speech: Clear and Coherent  Speech Volume: Normal  Handedness: Right   Mood and Affect  Mood: Euthymic  Affect: Appropriate; Congruent   Thought Process  Thought Processes: Coherent  Descriptions of Associations:Intact  Orientation:Full (Time, Place and Person)  Thought Content:Logical  History of Schizophrenia/Schizoaffective disorder:No  Duration of Psychotic Symptoms:N/A  Hallucinations:Hallucinations: None  Ideas of Reference:None  Suicidal Thoughts:Suicidal Thoughts: No  Homicidal Thoughts:Homicidal Thoughts: No    Sensorium  Memory: Immediate Good  Judgment: Good  Insight: Good  Executive Functions  Concentration: Good  Attention Span: Good  Recall: Good  Fund of Knowledge: Good  Language: Good  Psychomotor Activity  Psychomotor Activity: Psychomotor Activity: Normal  Assets  Assets: Communication Skills; Resilience; Social Support  Sleep  Sleep: Sleep: Good  Physical Exam: Physical Exam Constitutional:      Appearance: Normal appearance.  HENT:     Nose: Nose  normal.   Eyes:     Pupils: Pupils are equal, round, and reactive to light.  Musculoskeletal:     Cervical back: Normal range of motion.  Neurological:     Mental Status: She is alert and oriented to person, place, and time.    Review of Systems  Constitutional:  Negative for fever.  HENT: Negative.  Negative for sore throat.   Eyes: Negative.   Respiratory: Negative.  Negative for cough.   Cardiovascular: Negative.   Gastrointestinal:  Negative for heartburn.  Genitourinary:  Negative for dysuria.  Musculoskeletal:  Negative for myalgias.  Skin: Negative.   Neurological: Negative.   Psychiatric/Behavioral:  Positive for depression (Symptoms have significantly decreased as per patient). Negative for hallucinations, memory loss, substance abuse and suicidal ideas. The patient has insomnia. The patient is not nervous/anxious.    Blood pressure 112/78, pulse 90, temperature 97.7 F (36.5 C), temperature source Oral, resp. rate 16, height  (1.6 m), weight 98 kg, SpO2 100 %, not currently breastfeeding. Body mass index is 38.26 kg/m.  Treatment Plan Summary: Daily contact with patient to assess and evaluate symptoms and progress in treatment and Medication management   Observation Level/Precautions:  15 minute checks  Laboratory:  Labs reviewed   Psychotherapy:  Unit Group sessions  Medications:  See [facility]  Consultations:  To be determined   Discharge Concerns:  Safety, medication compliance, mood stability  Estimated LOS: 5-7 days  Other:  N/A    Labs Reviewed on 07/13/2022: EKG with Qtc-450. Will repeat EKG in 1-2 days. CMP from 10/20 WNL. Repeated CMP and it is WNL. Lipid panel with cholesterol slightly elevated at 206 & LDL slightly elevated at 129. CBC WNL. TSH WNL. HA1C WNL. baseline UA with moderate leukocytes and many bacteria. Will repeat UA.  Beta HCG <1.   PLAN Safety and Monitoring: Voluntary admission to inpatient psychiatric unit for safety, stabilization and  treatment Daily contact with patient to assess and evaluate symptoms and progress in treatment Patient's case to be discussed in multi-disciplinary team meeting Observation Level : q15 minute checks Vital signs: q12 hours Precautions:Safety   Long Term Goal(s): Improvement in symptoms so as ready for discharge   Short Term Goals: Ability to identify changes in lifestyle to reduce recurrence of condition will improve, Ability to verbalize feelings will improve, Ability to disclose and discuss suicidal ideas, Ability to demonstrate self-control will improve, Ability to identify and develop effective coping behaviors will improve, and Compliance with prescribed medications will improve   Diagnoses  Principal Problem:   Bipolar 2 disorder, major depressive episode (HCC) Active Problems:   Oppositional defiant disorder   Borderline personality disorder (HCC)   PTSD (post-traumatic stress disorder)   GAD (generalized anxiety disorder)   Medications -Previously discontinued home Seroquel XR 50 mg nightly (lack of effectiveness) -Continue Abilify 5 mg daily for mood stabilization -Continue Prozac 10 mg daily for depressive symptoms -Continue Hydroxyzine 25 mg every 6 hours PRN for anxiety -Continue Trazodone 50 mg PRN nightly  -Continue Risperidone/Ativan/Geodon PRN for agitation-See MAR -Continue Bacitracin to b/l upper thighs and R wrist for infection prophylaxis -Continue Colace 100 mg daily for constipation -Continue Macrobid 100 mg BID x 7 days for UTI   Other PRNS -Continue Tylenol 650 mg every 6 hours PRN for mild pain -Continue Maalox 30 mg every 4 hrs PRN for indigestion -Continue Milk of Magnesia as needed every 6 hrs for constipation   Discharge Planning: Social work and case management to assist with  discharge planning and identification of hospital follow-up needs prior to discharge Estimated LOS: 5-7 days Discharge Concerns: Need to establish a safety plan; Medication  compliance and effectiveness Discharge Goals: Return home with outpatient referrals for mental health follow-up including medication management/psychotherapy  Sharon Blue, NP 07/16/2022, 2:20 PMPatient ID: [name], female   DOB: 1996/06/14, 26 y.o.   MRN: [id number] Patient ID: Sharon Ward, female   DOB: [birth year], 26 y.o.   MRN: [id number]

## 2022-07-16 NOTE — Progress Notes (Signed)
D: Pt denied SI/HI/AVH this morning. Pt rated her depression a 0/10, anxiety a 0/10, and feelings of hopelessness a 0/10. Pharmacy provided a larger tube of neosporin for pt to apply to her superficial cuts. Pt seen interacting on the unit well with patients and staff. Pt has been pleasant, calm, and cooperative throughout the shift.   A: RN provided support and encouragement to patient. Pt given scheduled medications as prescribed. Q15 min checks verified for safety.    R: Patient verbally contracts for safety. Patient compliant with medications and treatment plan. Patient is interacting well on the unit. Pt is safe on the unit.   07/16/22 1000  Psych Admission Type (Psych Patients Only)  Admission Status Voluntary  Psychosocial Assessment  Patient Complaints None  Eye Contact Fair  Facial Expression Flat  Affect Appropriate to circumstance  Speech Logical/coherent  Interaction Assertive  Motor Activity Other (Comment) (WDL)  Appearance/Hygiene Unremarkable  Behavior Characteristics Cooperative;Appropriate to situation  Mood Pleasant  Thought Process  Coherency WDL  Content WDL  Delusions None reported or observed  Perception WDL  Hallucination None reported or observed  Judgment Impaired  Confusion None  Danger to Self  Current suicidal ideation? Denies  Self-Injurious Behavior No self-injurious ideation or behavior indicators observed or expressed   Agreement Not to Harm Self Yes  Description of Agreement Pt verbally contracts for safety  Danger to Others  Danger to Others None reported or observed

## 2022-07-16 NOTE — Progress Notes (Signed)
   07/16/22 0541  Sleep  Number of Hours 6.25

## 2022-07-16 NOTE — Group Note (Signed)
Recreation Therapy Group Note   Group Topic:Stress Management  Group Date: 07/16/2022 Start Time: 0930 End Time: 1000 Facilitators: Tanishia Lemaster-McCall, LRT,CTRS Location: [address] Dayroom   Goal Area(s) Addresses:  Patient will identify positive stress management techniques. Patient will identify benefits of using stress management post d/c.   Group Description:  Relaxation.  LRT and patients discussed the importance of relaxation and how it affects you.  LRT and patients also discussed the many ways/methods you can use to relax, such as soothing music, going on a walk, watching certain types of movies, meditation, etc.  Patients were encouraged to try some of the methods mentioned to help them relax.   Affect/Mood: Appropriate   Participation Level: Engaged   Participation Quality: Independent   Behavior: Appropriate   Speech/Thought Process: Focused   Insight: Good   Judgement: Good   Modes of Intervention: Guided Discussion   Patient Response to Interventions:  Engaged   Education Outcome:  Acknowledges education and In group clarification offered    Clinical Observations/Individualized Feedback: Pt attended and participated in group session.   Plan: Continue to engage patient in RT group sessions 2-3x/week.   Leisha Trinkle-McCall, LRT,CTRS 07/16/2022 11:38 AM

## 2022-07-17 DIAGNOSIS — F3181 Bipolar II disorder: Principal | ICD-10-CM

## 2022-07-17 MED ORDER — NICOTINE 14 MG/24HR TD PT24: 14.0000 mg | MEDICATED_PATCH | Freq: Every day | TRANSDERMAL | 0 refills | Status: DC

## 2022-07-17 MED ORDER — TRAZODONE HCL 50 MG PO TABS: 50.0000 mg | ORAL_TABLET | Freq: Every evening | ORAL | 0 refills | Status: DC | PRN

## 2022-07-17 MED ORDER — FLUOXETINE HCL 10 MG PO CAPS: 10.0000 mg | ORAL_CAPSULE | Freq: Every day | ORAL | 0 refills | Status: DC

## 2022-07-17 MED ORDER — DOCUSATE SODIUM 100 MG PO CAPS: 100.0000 mg | ORAL_CAPSULE | Freq: Every day | ORAL | 2 refills | Status: DC | PRN

## 2022-07-17 MED ORDER — TRIPLE ANTIBIOTIC 3.5-400-5000 EX OINT: 1.0000 | TOPICAL_OINTMENT | Freq: Two times a day (BID) | CUTANEOUS | 0 refills | Status: DC

## 2022-07-17 MED ORDER — NITROFURANTOIN MONOHYD MACRO 100 MG PO CAPS: 100.0000 mg | ORAL_CAPSULE | Freq: Two times a day (BID) | ORAL | 0 refills | Status: DC

## 2022-07-17 MED ORDER — ARIPIPRAZOLE 5 MG PO TABS: 5.0000 mg | ORAL_TABLET | Freq: Every day | ORAL | 0 refills | Status: DC

## 2022-07-17 MED ORDER — HYDROXYZINE HCL 25 MG PO TABS: 25.0000 mg | ORAL_TABLET | Freq: Three times a day (TID) | ORAL | 0 refills | Status: DC | PRN

## 2022-07-17 NOTE — Progress Notes (Signed)
  [facility] Adult Case Management Discharge Plan :  Will you be returning to the same living situation after discharge:  Yes,  has her own home At discharge, do you have transportation home?: Yes,  given bus pass Do you have the ability to pay for your medications: Yes,  Medicaid  Release of information consent forms completed and emailed to Medical Records, then turned in to Medical Records by CSW.   Patient to Follow up at:  Follow-up Information     [facility] Follow up.   Why: You are connected with this ACTT Team. Please continue to follow mental health recommendations from this agency. Contact information: [address] [place] [address] [phone]                 Next level of care provider has access to [facility] Link:no  Safety Planning and Suicide Prevention discussed: Yes,  with ACTT Team     Has patient been referred to the Quitline?: N/A patient is not a smoker  Patient has been referred for addiction treatment: N/A  Lynnell Chad, LCSW 07/17/2022, 10:01 AM
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
## 2022-07-17 NOTE — Discharge Summary (Signed)
Physician Discharge Summary Note  Patient:  Sharon Ward is an 26 y.o., female MRN:  [id number] DOB:  1995/11/27 Patient phone:  [phone] (home)  Patient address:   [address] [place] [address],  Total Time spent with patient: 30 minutes  Date of Admission:  07/12/2022 Date of Discharge: 07/17/2022  Reason for admission: Sharon Ward is a 26 yo Caucasian female with a past history of bipolar d/o, GAD, ODD & borderline personality d/o who walked into this [facility] on 12/4 with worsening depressive symptoms and suicidal ideations. She denied having a plan, but self inflicted superficial cuts to b/l thighs and right wrist prior to presenting at this hospital. Pt was admitted voluntarily for treatment and stabilization of her mood.    Principal Problem: Bipolar 2 disorder, major depressive episode (HCC) Discharge Diagnoses: Principal Problem:   Bipolar 2 disorder, major depressive episode (HCC) Active Problems:   Oppositional defiant disorder   Borderline personality disorder (HCC)   PTSD (post-traumatic stress disorder)   GAD (generalized anxiety disorder)   UTI (urinary tract infection)  Past Psychiatric History: Bipolar d/o  Past Medical History:  Past Medical History:  Diagnosis Date   Asthma    Bipolar 1 disorder, mixed (HCC)    Depression    Generalized anxiety disorder    Intentional drug overdose (HCC) 11/03/2020   Low-lying placenta 01/07/2022   Resolved 03/04/22   Relationship dysfunction    Suicide attempt (HCC) 11/02/2020    Past Surgical History:  Procedure Laterality Date   PILONIDAL CYST / SINUS EXCISION  09/11/2013   PILONIDAL CYST EXCISION  05/17/2014   Pilonidal cystectomy with cleft lip   Family History:  Family History  Problem Relation Age of Onset   Asthma Mother    Diabetes Mother    Healthy Mother    Hypertension Father    Asthma Father    Diabetes Father    Healthy Father    Asthma Brother    Hypertension Paternal  Uncle    Diabetes Paternal Grandmother    Stroke Paternal Grandfather    Heart disease Paternal Grandfather    Hypertension Paternal Grandfather    Diabetes Paternal Grandfather    Family Psychiatric  History: See H& P Social History:  Social History   Substance and Sexual Activity  Alcohol Use Not Currently   Comment: occasional prior to pregnancy     Social History   Substance and Sexual Activity  Drug Use Not Currently   Frequency: 4.0 times per week   Types: Marijuana   Comment: No Delta 9 since February 2023    Social History   Socioeconomic History   Marital status: Single    Spouse name: Not on file   Number of children: 1   Years of education: 10   Highest education level: 10th grade  Occupational History   Not on file  Tobacco Use   Smoking status: Former    Packs/day: 1.00    Years: 15.00    Total pack years: 15.00    Types: Cigarettes    Quit date: 07/14/2021    Years since quitting: 1.0   Smokeless tobacco: Never   Tobacco comments:    only smoke a "couple" of cigarettes when stressed or anxious, socially with friends per [facility] chart  Vaping Use   Vaping Use: Former   Start date: 08/10/2003   Quit date: 05/04/2021  Substance and Sexual Activity   Alcohol use: Not Currently    Comment: occasional prior to  pregnancy   Drug use: Not Currently    Frequency: 4.0 times per week    Types: Marijuana    Comment: No Delta 9 since February 2023   Sexual activity: Not Currently    Partners: Male    Birth control/protection: None  Other Topics Concern   Not on file  Social History Narrative   Not on file   Social Determinants of Health   Financial Resource Strain: Not on file  Food Insecurity: Food Insecurity Present (05/29/2022)   Hunger Vital Sign    Worried About Running Out of Food in the Last Year: Sometimes true    Ran Out of Food in the Last Year: Never true  Transportation Needs: No Transportation Needs (05/29/2022)   PRAPARE - Therapist, art (Medical): No    Lack of Transportation (Non-Medical): No  Physical Activity: Not on file  Stress: Not on file  Social Connections: Not on file                                          HOSPITAL COURSE During the patient's hospitalization, patient had extensive initial psychiatric evaluation, and follow-up psychiatric evaluations every day. Psychiatric diagnoses provided upon initial assessment:  Principal Problem:   Bipolar 2 disorder, major depressive episode (HCC) Active Problems:   Oppositional defiant disorder   Borderline personality disorder (HCC)   PTSD (post-traumatic stress disorder)   GAD (generalized anxiety disorder)   UTI (urinary tract infection)   Patient's psychiatric medications were adjusted on admission as follows: -Discontinue home Seroquel XR 50 mg nightly (lack of effectiveness) -Start Abilify 5 mg daily for mood stabilization -Continue Prozac 10 mg daily for depressive symptoms -Continue Hydroxyzine 25 mg every 6 hours PRN for anxiety -Continue Trazodone 50 mg PRN nightly  -Continue Risperidone/Ativan/Geodon PRN for agitation-See MAR -Start Bacitracin to b/l upper thighs and R wrist for infection prophylaxis   During the hospitalization, other adjustments were made to the patient's psychiatric medication regimen. Medications at discharge are as follows: -Continue Abilify 5 mg daily for mood stabilization -Continue Prozac 10 mg daily for depressive symptoms -Continue Hydroxyzine 25 mg every 6 hours PRN for anxiety -Continue Trazodone 50 mg PRN nightly  -Continue Bacitracin to b/l upper thighs and R wrist for infection prophylaxis -Continue Colace 100 mg daily for constipation -Continue Macrobid 100 mg BID x 5 days for UTI   Patient's care was discussed during the interdisciplinary team meeting every day during the hospitalization. The patient denies having side effects to prescribed psychiatric medication. Gradually, patient started  adjusting to milieu. The patient was evaluated each day by a clinical provider to ascertain response to treatment. Improvement was noted by the patient's report of decreasing symptoms, improved sleep and appetite, affect, medication tolerance, behavior, and participation in unit programming.  Patient was asked each day to complete a self inventory noting mood, mental status, pain, new symptoms, anxiety and concerns.     Symptoms were reported as significantly decreased or resolved completely by discharge. On day of discharge, the patient reports that their mood is stable. The patient denied having suicidal thoughts for more than 48 hours prior to discharge.  Patient denies having homicidal thoughts.  Patient denies having auditory hallucinations.  Patient denies any visual hallucinations or other symptoms of psychosis. The patient was motivated to continue taking medication with a goal of continued improvement in mental  health.    The patient reports their target psychiatric symptoms of anxiety, depression, and insomnia responded well to the psychiatric medications, and the patient reports overall benefit from this psychiatric hospitalization. Supportive psychotherapy was provided to the patient. The patient also participated in regular group therapy while hospitalized. Coping skills, problem solving as well as relaxation therapies were also part of the unit programming.   Physical Findings: AIMS: 0 CIWA:  n/a COWS:  n/a  Musculoskeletal: Strength & Muscle Tone: within normal limits Gait & Station: normal Patient leans: N/A  Psychiatric Specialty Exam:  Presentation  General Appearance:  Appropriate for Environment  Eye Contact: Good  Speech: Clear and Coherent  Speech Volume: Normal  Handedness: Right   Mood and Affect  Mood: Euthymic  Affect: Congruent   Thought Process  Thought Processes: Coherent  Descriptions of Associations:Intact  Orientation:Full (Time, Place  and Person)  Thought Content:Logical  History of Schizophrenia/Schizoaffective disorder:No  Duration of Psychotic Symptoms:N/A  Hallucinations:Hallucinations: None  Ideas of Reference:None  Suicidal Thoughts:Suicidal Thoughts: No  Homicidal Thoughts:Homicidal Thoughts: No   Sensorium  Memory: Immediate Good  Judgment: Good  Insight: Good  Executive Functions  Concentration: Good  Attention Span: Good  Recall: Good  Fund of Knowledge: Good  Language: Good  Psychomotor Activity  Psychomotor Activity: Psychomotor Activity: Normal  Assets  Assets: Communication Skills  Sleep  Sleep: Sleep: Good  Physical Exam: Physical Exam Constitutional:      Appearance: Normal appearance.  HENT:     Head: Normocephalic.     Nose: Nose normal. No congestion or rhinorrhea.  Eyes:     Pupils: Pupils are equal, round, and reactive to light.  Pulmonary:     Effort: No respiratory distress.  Musculoskeletal:        General: Normal range of motion.     Cervical back: Normal range of motion.  Neurological:     Mental Status: She is alert and oriented to person, place, and time.  Psychiatric:        Thought Content: Thought content normal.    Review of Systems  Constitutional:  Negative for fever.  HENT: Negative.  Negative for hearing loss and sore throat.   Eyes: Negative.   Respiratory: Negative.  Negative for cough.   Cardiovascular: Negative.  Negative for chest pain.  Gastrointestinal: Negative.   Genitourinary: Negative.   Musculoskeletal: Negative.   Skin: Negative.   Neurological: Negative.   Psychiatric/Behavioral:  Positive for depression (Denies SI, denies HI, denies AVH, verbally contracts for safety outside of this [facility]). Negative for hallucinations, memory loss, substance abuse and suicidal ideas. The patient is nervous/anxious (Have significantly reduced, and resolving) and has insomnia (Resolving on current medications).    Blood  pressure 114/69, pulse 98, temperature 98.7 F (37.1 C), temperature source Oral, resp. rate 16, height 5\' 3"  (1.6 m), weight 98 kg, SpO2 97 %, not currently breastfeeding. Body mass index is 38.26 kg/m.   Social History   Tobacco Use  Smoking Status Former   Packs/day: 1.00   Years: 15.00   Total pack years: 15.00   Types: Cigarettes   Quit date: 07/14/2021   Years since quitting: 1.0  Smokeless Tobacco Never  Tobacco Comments   only smoke a "couple" of cigarettes when stressed or anxious, socially with friends per [facility] chart   Tobacco Cessation:  FDA smoking cessation aide prescribed at discharge  Blood Alcohol level:  Lab Results  Component Value Date   ETH <10 11/10/2021   ETH <10  99991111    Metabolic Disorder Labs:  Lab Results  Component Value Date   HGBA1C 5.4 07/13/2022   MPG 108 07/13/2022   MPG 91 11/10/2021   Lab Results  Component Value Date   PROLACTIN 48.1 (H) 11/10/2021   Lab Results  Component Value Date   CHOL 206 (H) 07/13/2022   TRIG 60 07/13/2022   HDL 65 07/13/2022   CHOLHDL 3.2 07/13/2022   VLDL 12 07/13/2022   LDLCALC 129 (H) 07/13/2022   LDLCALC 93 11/10/2021    See Psychiatric Specialty Exam and Suicide Risk Assessment completed by Attending Physician prior to discharge.  Discharge destination:  Home  Is patient on multiple antipsychotic therapies at discharge:  No   Has Patient had three or more failed trials of antipsychotic monotherapy by history:  No  Recommended Plan for Multiple Antipsychotic Therapies: NA   Allergies as of 07/17/2022       Reactions   Ascorbate Rash   Citrus Rash   Coconut Flavor Rash   Lamotrigine Rash   Orange (diagnostic) Rash   Peach Flavor Rash   Pear Rash   Pineapple Rash        Medication List     STOP taking these medications    QUEtiapine 25 MG tablet Commonly known as: SEROQUEL       TAKE these medications      Indication  ARIPiprazole 5 MG tablet Commonly known as:  ABILIFY Take 1 tablet (5 mg total) by mouth daily. Start taking on: July 18, 2022  Indication: Bipolar d/o   docusate sodium 100 MG capsule Commonly known as: Colace Take 1 capsule (100 mg total) by mouth daily as needed.  Indication: Constipation   FLUoxetine 10 MG capsule Commonly known as: PROZAC Take 1 capsule (10 mg total) by mouth daily. What changed: Another medication with the same name was removed. Continue taking this medication, and follow the directions you see here.  Indication: Depression   hydrOXYzine 25 MG tablet Commonly known as: ATARAX Take 1 tablet (25 mg total) by mouth 3 (three) times daily as needed for anxiety. What changed: when to take this  Indication: Feeling Anxious   neomycin-bacitracin-polymyxin [phone] Oint Apply 1 Application topically 2 (two) times daily.  Indication: infection prophylaxis   nicotine 14 mg/24hr patch Commonly known as: NICODERM CQ - dosed in mg/24 hours Place 1 patch (14 mg total) onto the skin daily. Start taking on: July 18, 2022  Indication: Nicotine Addiction   nitrofurantoin (macrocrystal-monohydrate) 100 MG capsule Commonly known as: MACROBID Take 1 capsule (100 mg total) by mouth every 12 (twelve) hours.  Indication: Simple Infection of the Urinary Tract   traZODone 50 MG tablet Commonly known as: DESYREL Take 1 tablet (50 mg total) by mouth at bedtime as needed for sleep.  Indication: [place] Follow up.   Why: You are connected with this ACTT Team. Please continue to follow mental health recommendations from this agency. Contact information: [address] [place] [address] [phone]                Follow-up recommendations:   Labs were reviewed with the patient, and abnormal results were discussed with the patient.   The patient is able to verbalize their individual safety plan to this provider.   #  It is recommended to the patient to continue psychiatric medications as prescribed, after discharge from the hospital.     #  It is recommended to the patient to follow up with your outpatient psychiatric provider and PCP.   # It was discussed with the patient, the impact of alcohol, drugs, tobacco have been there overall psychiatric and medical wellbeing, and total abstinence from substance use was recommended the patient.ed.   # Prescriptions provided or sent directly to preferred pharmacy at discharge. Patient agreeable to plan. Given opportunity to ask questions. Appears to feel comfortable with discharge.    # In the event of worsening symptoms, the patient is instructed to call the crisis hotline (988), 911 and or go to the nearest ED for appropriate evaluation and treatment of symptoms. To follow-up with primary care provider for other medical issues, concerns and or health care needs   # Patient was discharged home with a plan to follow up as noted above.   Signed: Nicholes Rough, NP 07/17/2022, 11:50 AM

## 2022-07-17 NOTE — Progress Notes (Signed)
Pt discharged to lobby with bus pass. Pt was stable and appreciative at that time. All papers, samples, and prescriptions were given and valuables returned. Suicide safety plan completed and reviewed. Verbal understanding expressed. Denies SI/HI and A/VH. Pt given opportunity to express concerns and ask questions.

## 2022-07-17 NOTE — Progress Notes (Signed)
Pt rates depression 0/10 and anxiety 0/10. Pt reports a fair appetite, and no physical problems. Pt denies SI/HI/AVH and verbally contracts for safety. Provided support and encouragement. Pt safe on the unit. Q 15 minute safety checks continued.   

## 2022-07-17 NOTE — Progress Notes (Signed)
   07/17/22 0900  Psych Admission Type (Psych Patients Only)  Admission Status Voluntary  Psychosocial Assessment  Patient Complaints None  Eye Contact Fair  Facial Expression Anxious  Affect Appropriate to circumstance  Speech Logical/coherent  Interaction Assertive  Motor Activity Other (Comment) (steady gait)  Appearance/Hygiene Unremarkable  Behavior Characteristics Cooperative;Appropriate to situation  Mood Pleasant  Thought Process  Coherency WDL  Content WDL  Delusions None reported or observed  Perception WDL  Hallucination None reported or observed  Judgment Impaired  Confusion None  Danger to Self  Current suicidal ideation? Denies  Self-Injurious Behavior No self-injurious ideation or behavior indicators observed or expressed   Agreement Not to Harm Self Yes  Description of Agreement verbal contract for safety  Danger to Others  Danger to Others None reported or observed

## 2022-07-19 ENCOUNTER — Ambulatory Visit: Payer: Medicaid Other | Admitting: Obstetrics and Gynecology

## 2022-07-20 ENCOUNTER — Ambulatory Visit (HOSPITAL_COMMUNITY)
Admission: EM | Admit: 2022-07-20 | Discharge: 2022-07-20 | Discharge status: Against Medical Advice | Payer: Medicaid Other

## 2022-07-21 ENCOUNTER — Ambulatory Visit (HOSPITAL_COMMUNITY)
Admission: EM | Admit: 2022-07-21 | Discharge: 2022-07-21 | Disposition: A | Discharge status: Home / Self Care | Payer: Medicaid Other | Attending: Emergency Medicine | Admitting: Emergency Medicine

## 2022-07-21 ENCOUNTER — Encounter (HOSPITAL_COMMUNITY): Payer: Self-pay

## 2022-07-21 VITALS — BP 139/80 | HR 97 | Temp 97.4°F | Resp 18

## 2022-07-21 DIAGNOSIS — U071 COVID-19: Secondary | ICD-10-CM | POA: Insufficient documentation

## 2022-07-21 DIAGNOSIS — J069 Acute upper respiratory infection, unspecified: Secondary | ICD-10-CM

## 2022-07-21 DIAGNOSIS — J4520 Mild intermittent asthma, uncomplicated: Secondary | ICD-10-CM | POA: Diagnosis not present

## 2022-07-21 LAB — SARS CORONAVIRUS 2 (TAT 6-24 HRS): SARS Coronavirus 2: POSITIVE — AB

## 2022-07-21 MED ORDER — BENZONATATE 100 MG PO CAPS: 100.0000 mg | ORAL_CAPSULE | Freq: Three times a day (TID) | ORAL | 0 refills | Status: DC

## 2022-07-21 MED ORDER — PREDNISONE 20 MG PO TABS: 40.0000 mg | ORAL_TABLET | Freq: Every day | ORAL | 0 refills | Status: DC

## 2022-07-21 MED ORDER — PROMETHAZINE-DM 6.25-15 MG/5ML PO SYRP: 5.0000 mL | ORAL_SOLUTION | Freq: Four times a day (QID) | ORAL | 0 refills | Status: DC | PRN

## 2022-07-21 NOTE — Discharge Instructions (Addendum)
Your symptoms today are most likely being caused by a virus and should steadily improve in time it can take up to 7 to 10 days before you truly start to see a turnaround however things will get better  COVID test is pending, you will be notified of positive test results only, if positive you will need to quarantine until Saturday, if still having symptoms on Saturday please wear a mask when completing day-to-day activities  As of breath and wheezing begin prednisone every morning with food for 5 days  You may use Tessalon pill every 8 hours to help calm for coughing  May use cough syrup every 6 hours as needed for additional comfort  Look for coupons for your medicine on GoodRx.com    You can take Tylenol and/or Ibuprofen as needed for fever reduction and pain relief.   For cough: honey 1/2 to 1 teaspoon (you can dilute the honey in water or another fluid).  You can also use guaifenesin and dextromethorphan for cough. You can use a humidifier for chest congestion and cough.  If you don't have a humidifier, you can sit in the bathroom with the hot shower running.      For sore throat: try warm salt water gargles, cepacol lozenges, throat spray, warm tea or water with lemon/honey, popsicles or ice, or OTC cold relief medicine for throat discomfort.   For congestion: take a daily anti-histamine like Zyrtec, Claritin, and a oral decongestant, such as pseudoephedrine.  You can also use Flonase 1-2 sprays in each nostril daily.   It is important to stay hydrated: drink plenty of fluids (water, gatorade/powerade/pedialyte, juices, or teas) to keep your throat moisturized and help further relieve irritation/discomfort.

## 2022-07-21 NOTE — ED Provider Notes (Signed)
[facility]    CSN: [id number] Arrival date & time: 07/21/22  1206      History   Chief Complaint Chief Complaint  Patient presents with   Cough   Nasal Congestion    HPI Sharon Ward is a 26 y.o. female.   Patient presents with subjective fever, chills, body aches, nasal congestion, rhinorrhea, bilateral ear pain, cough, shortness of breath and wheezing beginning 3 days ago.  Cough is productive with green sputum, noticed 2 dots of blood within sputum this morning.  Shortness of breath is with exertion, improves with rest.  Wheezing is heard throughout the day.  Known exposure to COVID and RSV.  Decreased appetite but tolerating liquids, endorses loss of taste.  Has attempted use of NyQuil which has been minimally effective.  History of asthma.   Past Medical History:  Diagnosis Date   Asthma    Bipolar 1 disorder, mixed (HCC)    Depression    Generalized anxiety disorder    Intentional drug overdose (HCC) 11/03/2020   Low-lying placenta 01/07/2022   Resolved 03/04/22   Relationship dysfunction    Suicide attempt (HCC) 11/02/2020    Patient Active Problem List   Diagnosis Date Noted   UTI (urinary tract infection) 07/15/2022   GBS bacteriuria 11/09/2021   Bipolar disorder, rapid cycling (HCC) 11/09/2021   GAD (generalized anxiety disorder) 09/24/2021   Marijuana abuse 05/05/2021   Bipolar 2 disorder, major depressive episode (HCC) 02/05/2021   Prolonged QT interval 11/02/2020   LGSIL on Pap smear of cervix 09/18/2019   Oppositional defiant disorder 09/18/2019   History of dysphagia 02/01/2018   Chronic constipation 04/25/2017   Borderline personality disorder (HCC) 10/24/2016   PTSD (post-traumatic stress disorder) 10/24/2016   Gastroesophageal reflux disease without esophagitis 10/24/2016   Mild intermittent asthma without complication 10/24/2016    Past Surgical History:  Procedure Laterality Date   PILONIDAL CYST / SINUS EXCISION  09/11/2013    PILONIDAL CYST EXCISION  05/17/2014   Pilonidal cystectomy with cleft lip    OB History     Gravida  3   Para  2   Term  2   Preterm      AB  1   Living  2      SAB  1   IAB      Ectopic      Multiple  0   Live Births  2            Home Medications    Prior to Admission medications   Medication Sig Start Date End Date Taking? Authorizing Provider  ARIPiprazole (ABILIFY) 5 MG tablet Take 1 tablet (5 mg total) by mouth daily. 07/18/22   Starleen Blue, NP  docusate sodium (COLACE) 100 MG capsule Take 1 capsule (100 mg total) by mouth daily as needed. 07/17/22 07/17/23  Starleen Blue, NP  FLUoxetine (PROZAC) 10 MG capsule Take 1 capsule (10 mg total) by mouth daily. 07/17/22   Starleen Blue, NP  hydrOXYzine (ATARAX) 25 MG tablet Take 1 tablet (25 mg total) by mouth 3 (three) times daily as needed for anxiety. 07/17/22   Starleen Blue, NP  neomycin-bacitracin-polymyxin [phone] OINT Apply 1 Application topically 2 (two) times daily. 07/17/22   Nkwenti, Tyler Aas, NP  nicotine (NICODERM CQ - DOSED IN MG/24 HOURS) 14 mg/24hr patch Place 1 patch (14 mg total) onto the skin daily. 07/18/22   Starleen Blue, NP  nitrofurantoin, macrocrystal-monohydrate, (MACROBID) 100 MG capsule Take 1 capsule (100 mg total)  by mouth every 12 (twelve) hours. 07/17/22   Starleen Blue, NP  traZODone (DESYREL) 50 MG tablet Take 1 tablet (50 mg total) by mouth at bedtime as needed for sleep. 07/17/22   Starleen Blue, NP    Family History Family History  Problem Relation Age of Onset   Asthma Mother    Diabetes Mother    Healthy Mother    Hypertension Father    Asthma Father    Diabetes Father    Healthy Father    Asthma Brother    Hypertension Paternal Uncle    Diabetes Paternal Grandmother    Stroke Paternal Grandfather    Heart disease Paternal Grandfather    Hypertension Paternal Grandfather    Diabetes Paternal Grandfather     Social History Social History   Tobacco Use    Smoking status: Former    Packs/day: 1.00    Years: 15.00    Total pack years: 15.00    Types: Cigarettes    Quit date: 07/14/2021    Years since quitting: 1.0   Smokeless tobacco: Never   Tobacco comments:    only smoke a "couple" of cigarettes when stressed or anxious, socially with friends per [facility] chart  Vaping Use   Vaping Use: Former   Start date: 08/10/2003   Quit date: 05/04/2021  Substance Use Topics   Alcohol use: Not Currently    Comment: occasional prior to pregnancy   Drug use: Not Currently    Frequency: 4.0 times per week    Types: Marijuana    Comment: No Delta 9 since February 2023     Allergies   Ascorbate, Citrus, Coconut flavor, Lamotrigine, Orange (diagnostic), Peach flavor, Pear, and Pineapple   Review of Systems Review of Systems  Constitutional:  Positive for chills and fever. Negative for activity change, appetite change, diaphoresis, fatigue and unexpected weight change.  HENT:  Positive for congestion, ear pain, rhinorrhea and sore throat. Negative for dental problem, drooling, ear discharge, facial swelling, hearing loss, mouth sores, nosebleeds, postnasal drip, sinus pressure, sinus pain, sneezing, tinnitus, trouble swallowing and voice change.   Respiratory:  Positive for cough, shortness of breath and wheezing. Negative for apnea, choking, chest tightness and stridor.   Cardiovascular: Negative.   Musculoskeletal:  Positive for myalgias. Negative for arthralgias, back pain, gait problem, joint swelling, neck pain and neck stiffness.     Physical Exam Triage Vital Signs ED Triage Vitals [07/21/22 1246]  Enc Vitals Group     BP 139/80     Pulse Rate 97     Resp 18     Temp (!) 97.4 F (36.3 C)     Temp Source Oral     SpO2 97 %     Weight      Height      Head Circumference      Peak Flow      Pain Score      Pain Loc      Pain Edu?      Excl. in GC?    No data found.  Updated Vital Signs BP 139/80 (BP Location: Left Arm)   Pulse  97   Temp (!) 97.4 F (36.3 C) (Oral)   Resp 18   LMP 07/21/2022   SpO2 97%   Visual Acuity Right Eye Distance:   Left Eye Distance:   Bilateral Distance:    Right Eye Near:   Left Eye Near:    Bilateral Near:     Physical Exam Constitutional:  Appearance: Normal appearance.  HENT:     Head: Normocephalic.     Right Ear: Tympanic membrane, ear canal and external ear normal.     Left Ear: Tympanic membrane, ear canal and external ear normal.     Nose: Congestion and rhinorrhea present.     Mouth/Throat:     Mouth: Mucous membranes are moist.     Pharynx: Oropharynx is clear. Posterior oropharyngeal erythema present.  Eyes:     Extraocular Movements: Extraocular movements intact.  Cardiovascular:     Rate and Rhythm: Normal rate and regular rhythm.     Pulses: Normal pulses.     Heart sounds: Normal heart sounds.  Pulmonary:     Effort: Pulmonary effort is normal.     Breath sounds: Normal breath sounds.  Skin:    General: Skin is warm and dry.  Neurological:     Mental Status: She is alert and oriented to person, place, and time. Mental status is at baseline.  Psychiatric:        Mood and Affect: Mood normal.        Behavior: Behavior normal.      UC Treatments / Results  Labs (all labs ordered are listed, but only abnormal results are displayed) Labs Reviewed - No data to display  EKG   Radiology No results found.  Procedures Procedures (including critical care time)  Medications Ordered in UC Medications - No data to display  Initial Impression / Assessment and Plan / UC Course  I have reviewed the triage vital signs and the nursing notes.  Pertinent labs & imaging results that were available during my care of the patient were reviewed by me and considered in my medical decision making (see chart for details).  Viral URI with cough  Vital signs are stable, lungs are clear to auscultation, O2 saturation 97% on room air okay , COVID test is  pending, will defer use of antivirals if positive, discussed quarantine per CDC guidelines, will defer RSV testing due to age, discussed with patient, prescribed prednisone, Tessalon and Promethazine DM for outpatient use based on history of asthma, may use additional over-the-counter medications as needed for supportive care, may follow-up with urgent care as needed Final Clinical Impressions(s) / UC Diagnoses   Final diagnoses:  None   Discharge Instructions   None    ED Prescriptions   None    PDMP not reviewed this encounter.   Valinda Hoar, NP 07/21/22 1342

## 2022-07-21 NOTE — ED Triage Notes (Signed)
Pt reports cough and nasal congestion x 3 days.  Pt reports she is coughing up blood.

## 2022-07-28 ENCOUNTER — Emergency Department (HOSPITAL_COMMUNITY)
Admission: EM | Admit: 2022-07-28 | Discharge: 2022-07-30 | Disposition: A | Discharge status: Other Acute Inpatient Hospital | Payer: Medicaid Other | Attending: Emergency Medicine | Admitting: Emergency Medicine

## 2022-07-28 DIAGNOSIS — R45851 Suicidal ideations: Secondary | ICD-10-CM | POA: Diagnosis not present

## 2022-07-28 DIAGNOSIS — T50902A Poisoning by unspecified drugs, medicaments and biological substances, intentional self-harm, initial encounter: Secondary | ICD-10-CM | POA: Diagnosis not present

## 2022-07-28 DIAGNOSIS — F411 Generalized anxiety disorder: Secondary | ICD-10-CM | POA: Diagnosis present

## 2022-07-28 DIAGNOSIS — Z20822 Contact with and (suspected) exposure to covid-19: Secondary | ICD-10-CM | POA: Insufficient documentation

## 2022-07-28 DIAGNOSIS — X838XXA Intentional self-harm by other specified means, initial encounter: Secondary | ICD-10-CM | POA: Diagnosis not present

## 2022-07-28 DIAGNOSIS — T50901A Poisoning by unspecified drugs, medicaments and biological substances, accidental (unintentional), initial encounter: Secondary | ICD-10-CM | POA: Diagnosis present

## 2022-07-28 LAB — I-STAT BETA HCG BLOOD, ED (MC, WL, AP ONLY): I-stat hCG, quantitative: <5 m[IU]/mL (ref <5)

## 2022-07-28 LAB — COMPREHENSIVE METABOLIC PANEL
ALT: 25 U/L (ref 0–44)
AST: 22 U/L (ref 15–41)
Albumin: 3.4 g/dL — ABNORMAL LOW (ref 3.5–5.0)
Alkaline Phosphatase: 52 U/L (ref 38–126)
Anion gap: 6 (ref 5–15)
BUN: 13 mg/dL (ref 6–20)
CO2: 21 mmol/L — ABNORMAL LOW (ref 22–32)
Calcium: 7.8 mg/dL — ABNORMAL LOW (ref 8.9–10.3)
Chloride: 114 mmol/L — ABNORMAL HIGH (ref 98–111)
Creatinine, Ser: 0.67 mg/dL (ref 0.44–1.00)
GFR, Estimated: >60 mL/min (ref >60)
Glucose, Bld: 84 mg/dL (ref 70–99)
Potassium: 3.1 mmol/L — ABNORMAL LOW (ref 3.5–5.1)
Sodium: 141 mmol/L (ref 135–145)
Total Bilirubin: 0.4 mg/dL (ref 0.3–1.2)
Total Protein: 5.7 g/dL — ABNORMAL LOW (ref 6.5–8.1)

## 2022-07-28 LAB — CBC WITH DIFFERENTIAL/PLATELET
Abs Immature Granulocytes: 0.04 10*3/uL (ref 0.00–0.07)
Basophils Absolute: 0 10*3/uL (ref 0.0–0.1)
Basophils Relative: 1 %
Eosinophils Absolute: 0.1 10*3/uL (ref 0.0–0.5)
Eosinophils Relative: 1 %
HCT: 38.4 % (ref 36.0–46.0)
Hemoglobin: 12.5 g/dL (ref 12.0–15.0)
Immature Granulocytes: 1 %
Lymphocytes Relative: 32 %
Lymphs Abs: 2.8 10*3/uL (ref 0.7–4.0)
MCH: 30.1 pg (ref 26.0–34.0)
MCHC: 32.6 g/dL (ref 30.0–36.0)
MCV: 92.5 fL (ref 80.0–100.0)
Monocytes Absolute: 0.7 10*3/uL (ref 0.1–1.0)
Monocytes Relative: 8 %
Neutro Abs: 5.1 10*3/uL (ref 1.7–7.7)
Neutrophils Relative %: 57 %
Platelets: 260 10*3/uL (ref 150–400)
RBC: 4.15 MIL/uL (ref 3.87–5.11)
RDW: 12.9 % (ref 11.5–15.5)
WBC: 8.8 10*3/uL (ref 4.0–10.5)
nRBC: 0 % (ref 0.0–0.2)

## 2022-07-28 MED ORDER — SODIUM CHLORIDE 0.9 % IV BOLUS
500.0000 mL | Freq: Once | INTRAVENOUS | Status: AC
  Administered 2022-07-28: 500 mL via INTRAVENOUS

## 2022-07-28 MED ORDER — POTASSIUM CHLORIDE 10 MEQ/100ML IV SOLN
10.0000 meq | INTRAVENOUS | Status: AC
  Administered 2022-07-28 – 2022-07-29 (×4): 10 meq via INTRAVENOUS
  Filled 2022-07-28 (×4): qty 100

## 2022-07-28 MED ORDER — MAGNESIUM SULFATE 2 GM/50ML IV SOLN
2.0000 g | Freq: Once | INTRAVENOUS | Status: AC
  Administered 2022-07-28: 2 g via INTRAVENOUS
  Filled 2022-07-28: qty 50

## 2022-07-28 NOTE — ED Provider Notes (Signed)
[facility]EM[facility]ENCY [facility]PT Provider Note   CSN: [id number] Arrival date & time: 07/28/22  2237     History {Add pertinent medical, surgical, social history, OB history to HPI:1} No chief complaint on file.   Sharon Ward is a 26 y.o. female.  She is here for an intentional overdose that occurred about an hour prior to arrival.  She had access to trazodone 50 mg 30 tablets, hydroxyzine 25 mg 30 tablets, Abilify 5mg  30 tabs, and metronidazole 500mg .  She took them intentionally for self-harm.  She called her significant other who alerted 911.  They found her altered and she had a brief episode of bradycardia.  No interventions.  Patient is drowsy and unable to give much information.  The history is provided by the patient.  Drug Overdose This is a new problem. The current episode started 1 to 2 hours ago. The problem has not changed since onset.Nothing aggravates the symptoms. Nothing relieves the symptoms. She has tried nothing for the symptoms. The treatment provided no relief.       Home Medications Prior to Admission medications   Medication Sig Start Date End Date Taking? Authorizing Provider  ARIPiprazole (ABILIFY) 5 MG tablet Take 1 tablet (5 mg total) by mouth daily. 07/18/22   , NP  benzonatate (TESSALON) 100 MG capsule Take 1 capsule (100 mg total) by mouth every 8 (eight) hours. 07/21/22   White, Starleen Blue, NP  docusate sodium (COLACE) 100 MG capsule Take 1 capsule (100 mg total) by mouth daily as needed. 07/17/22 07/17/23  14/9/23, NP  FLUoxetine (PROZAC) 10 MG capsule Take 1 capsule (10 mg total) by mouth daily. 07/17/22   Starleen Blue, NP  hydrOXYzine (ATARAX) 25 MG tablet Take 1 tablet (25 mg total) by mouth 3 (three) times daily as needed for anxiety. 07/17/22   Starleen Blue, NP  neomycin-bacitracin-polymyxin [phone] OINT Apply 1 Application topically 2 (two) times daily. 07/17/22   Nkwenti, 01-27-1989, NP  nicotine (NICODERM CQ  - DOSED IN MG/24 HOURS) 14 mg/24hr patch Place 1 patch (14 mg total) onto the skin daily. 07/18/22   Tyler Aas, NP  nitrofurantoin, macrocrystal-monohydrate, (MACROBID) 100 MG capsule Take 1 capsule (100 mg total) by mouth every 12 (twelve) hours. 07/17/22   Starleen Blue, NP  predniSONE (DELTASONE) 20 MG tablet Take 2 tablets (40 mg total) by mouth daily. 07/21/22   White, Starleen Blue, NP  promethazine-dextromethorphan (PROMETHAZINE-DM) 6.25-15 MG/5ML syrup Take 5 mLs by mouth 4 (four) times daily as needed for cough. 07/21/22   10-11-1988, NP  traZODone (DESYREL) 50 MG tablet Take 1 tablet (50 mg total) by mouth at bedtime as needed for sleep. 07/17/22   Valinda Hoar, NP      Allergies    Ascorbate, Citrus, Coconut flavor, Lamotrigine, Orange (diagnostic), Peach flavor, Pear, and Pineapple    Review of Systems   Review of Systems  Unable to perform ROS: Mental status change    Physical Exam Updated Vital Signs LMP 07/21/2022  Physical Exam Vitals and nursing note reviewed.  Constitutional:      General: She is not in acute distress.    Appearance: Normal appearance. She is well-developed.  HENT:     Head: Normocephalic and atraumatic.  Eyes:     Conjunctiva/sclera: Conjunctivae normal.  Cardiovascular:     Rate and Rhythm: Normal rate and regular rhythm.     Heart sounds: No murmur heard. Pulmonary:     Effort: Pulmonary effort is normal. No  respiratory distress.     Breath sounds: Normal breath sounds.  Abdominal:     Palpations: Abdomen is soft.     Tenderness: There is no abdominal tenderness. There is no guarding or rebound.  Musculoskeletal:        General: No deformity.     Cervical back: Neck supple.  Skin:    General: Skin is warm and dry.     Capillary Refill: Capillary refill takes less than 2 seconds.  Neurological:     General: No focal deficit present.     Mental Status: She is lethargic.     ED Results / Procedures / Treatments   Labs (all  labs ordered are listed, but only abnormal results are displayed) Labs Reviewed  COMPREHENSIVE METABOLIC PANEL  ETHANOL  RAPID URINE DRUG SCREEN, HOSP PERFORMED  CBC WITH DIFFERENTIAL/PLATELET  SALICYLATE LEVEL  ACETAMINOPHEN LEVEL  URINALYSIS, ROUTINE W REFLEX MICROSCOPIC  I-STAT BETA HCG BLOOD, ED ([facility], [facility], [facility] ONLY)    EKG None  Radiology No results found.  Procedures Procedures  {Document cardiac monitor, telemetry assessment procedure when appropriate:1}  Medications Ordered in ED Medications  sodium chloride 0.9 % bolus 500 mL (has no administration in time range)    ED Course/ Medical Decision Making/ A&P                           Medical Decision Making Amount and/or Complexity of Data Reviewed Labs: ordered.   This patient complains of ***; this involves an extensive number of treatment Options and is a complaint that carries with it a high risk of complications and morbidity. The differential includes ***  I ordered, reviewed and interpreted labs, which included *** I ordered medication *** and reviewed PMP when indicated. I ordered imaging studies which included *** and I independently    visualized and interpreted imaging which showed *** Additional history obtained from *** Previous records obtained and reviewed *** I consulted *** and discussed lab and imaging findings and discussed disposition.  Cardiac monitoring reviewed, *** Social determinants considered, *** Critical Interventions: ***  After the interventions stated above, I reevaluated the patient and found *** Admission and further testing considered, ***   {Document critical care time when appropriate:1} {Document review of labs and clinical decision tools ie heart score, Chads2Vasc2 etc:1}  {Document your independent review of radiology images, and any outside records:1} {Document your discussion with family members, caretakers, and with consultants:1} {Document social determinants of  health affecting pt's care:1} {Document your decision making why or why not admission, treatments were needed:1} Final Clinical Impression(s) / ED Diagnoses Final diagnoses:  None    Rx / DC Orders ED Discharge Orders     None

## 2022-07-28 NOTE — ED Triage Notes (Signed)
Pt here for taking Aripiprazole, Hydroxyzine, Trazadone, and Metronidazole. Pt had those refilled about a day ago so they were full. Pt brady to 50s with EMS. Pt able to open eyes and communicate. Vitals are stable with 18G in Left hand

## 2022-07-29 DIAGNOSIS — R45851 Suicidal ideations: Secondary | ICD-10-CM

## 2022-07-29 LAB — URINALYSIS, ROUTINE W REFLEX MICROSCOPIC
Bilirubin Urine: NEGATIVE
Glucose, UA: NEGATIVE mg/dL
Hgb urine dipstick: NEGATIVE
Ketones, ur: NEGATIVE mg/dL
Nitrite: NEGATIVE
Protein, ur: NEGATIVE mg/dL
Specific Gravity, Urine: 1.01 (ref 1.005–1.030)
pH: 6 (ref 5.0–8.0)

## 2022-07-29 LAB — BASIC METABOLIC PANEL
Anion gap: 7 (ref 5–15)
BUN: 13 mg/dL (ref 6–20)
CO2: 23 mmol/L (ref 22–32)
Calcium: 8.5 mg/dL — ABNORMAL LOW (ref 8.9–10.3)
Chloride: 110 mmol/L (ref 98–111)
Creatinine, Ser: 0.76 mg/dL (ref 0.44–1.00)
GFR, Estimated: >60 mL/min (ref >60)
Glucose, Bld: 100 mg/dL — ABNORMAL HIGH (ref 70–99)
Potassium: 4.1 mmol/L (ref 3.5–5.1)
Sodium: 140 mmol/L (ref 135–145)

## 2022-07-29 LAB — ACETAMINOPHEN LEVEL
Acetaminophen (Tylenol), Serum: <10 ug/mL — ABNORMAL LOW (ref 10–30)
Acetaminophen (Tylenol), Serum: <10 ug/mL — ABNORMAL LOW (ref 10–30)

## 2022-07-29 LAB — MAGNESIUM: Magnesium: 2.2 mg/dL (ref 1.7–2.4)

## 2022-07-29 LAB — RAPID URINE DRUG SCREEN, HOSP PERFORMED
Amphetamines: NOT DETECTED
Barbiturates: NOT DETECTED
Benzodiazepines: NOT DETECTED
Cocaine: NOT DETECTED
Opiates: NOT DETECTED
Tetrahydrocannabinol: NOT DETECTED

## 2022-07-29 LAB — ETHANOL: Alcohol, Ethyl (B): <10 mg/dL (ref <10)

## 2022-07-29 LAB — SALICYLATE LEVEL: Salicylate Lvl: <7 mg/dL — ABNORMAL LOW (ref 7.0–30.0)

## 2022-07-29 NOTE — ED Notes (Signed)
Pt dressed in burgundy scrubs. Pt has 1 personal belonging's bag, labeled and placed in cabinet 16-18.

## 2022-07-29 NOTE — ED Notes (Signed)
Cathlean Cower from poison controlled called to f/u on patient. She informed RN that repeat EKG and Tylenol level was not needed at this time. Patient was supposed to be monitored for 10 hours and it seems she's been under observation for 12, Cathlean Cower stated patient is cleared and for RN to contact them if anything changes, clear understanding voiced by RN.

## 2022-07-29 NOTE — ED Provider Notes (Signed)
0700 Assumed care of patient from off-going team. For more details, please see note from same day.  In brief, this is a 26 y.o. female who presented w/ ingestion of medications intentional overdose. Poison control recommended observation x 10 hours.   Plan/Dispo at time of sign-out & ED Course since sign-out: [ ]  Medically cleared at 0900  BP (!) 106/57   Pulse (!) 57   Temp 98 F (36.7 C) (Oral)   Resp 13   LMP 07/21/2022   SpO2 94%    ED Course:   Clinical Course as of 07/29/22 0912  Wed Jul 28, 2022  2328 Discussed with poison control.  They feel this is mostly to be CNS depression although cannot see QTc prolongation and seizure risk low blood pressure.  Abilify would require 10-hour observation.  Main recommendations are repeat acetaminophen level at 4 hours along with repeat EKG.  Keep potassium greater than 4 and magnesium greater than 2. [MB]  Thu Jul 29, 2022  0911 Patient's repeat acetaminophen level at 0400 is negative. K was 3.1 and repleted, Mg was also repleted. UA w/ some leukocytes/WBC/bacteria but many squamous cells so likely not infected. Patient now resting calmly with stable vitals signs. She is medically cleared and consulted to psychiatry. [HN]    Clinical Course User Index [HN] 0912, MD [MB] Loetta Rough, MD    Dispo: TTS ------------------------------- Terrilee Files, MD Emergency Medicine  This note was created using dictation software, which may contain spelling or grammatical errors.   Vivi Barrack, MD 07/29/22 [phone]

## 2022-07-29 NOTE — ED Notes (Signed)
Patient  c/o  swelling to fingers and lips denies SOB or respiratory issues at this time. Fingers are swollen, cap refill >3 sec air way patent, speech clear.  Provider made aware.

## 2022-07-29 NOTE — ED Notes (Signed)
Patient requesting to see provider, provider made aware

## 2022-07-29 NOTE — Consult Note (Signed)
[facility] ED ASSESSMENT   Reason for Consult:  Intentional Overdose Referring Physician:  Dr. Mayra Neer Patient Identification: Sharon Ward MRN:  [id number] ED Chief Complaint: Suicidal ideation  Diagnosis:  Principal Problem:   Suicidal ideation Active Problems:   Overdose   ED Assessment Time Calculation: Start Time: 1100 Stop Time: 1130 Total Time in Minutes (Assessment Completion): 30   Subjective: Sharon Ward, 26 y.o., female patient seen face to face by this provider, and chart reviewed on 07/29/22.  On evaluation Sharon Ward reports that she took an overdose on her medications, she states it was intentional due to some family issues that were going on, in which she would not go into detail. Patient stated that she just has a lot of family drama.  During evaluation Sharon Ward is laying in her hospital bed in no acute distress, she does have some multi finger swelling in which the RN has been made aware.  She is alert, oriented x 4, calm, cooperative and attentive. Her mood is euthymic with congruent/ flat affect. She has normal speech, and behavior.  Objectively there is no evidence of psychosis/mania or delusional thinking. Patient is able to converse coherently, goal directed thoughts, no distractibility, or pre-occupation.  She continues to endorse suicidal/self-harm intentions due to family issues. Denies homicidal ideation, psychosis, and paranoia. Patient answered question appropriately.      HPI:  Per admission assessment: patient here for taking aripiprazole, hydroxyzine, trazodone, and metronidazole.  Patient had those refilled about a day ago so they were full.  Patient admitted she took those for intentional self harm.  Past Psychiatric History: Bipolar disorder, borderline personality, ODD, GAD, patient has multiple mental health related hospitalizations.  Risk to Self or Others: Is the patient at risk to self? Yes Has the patient been a risk to self in the past 6  months? Yes Has the patient been a risk to self within the distant past? No Is the patient a risk to others? No Has the patient been a risk to others in the past 6 months? No Has the patient been a risk to others within the distant past? No  [place] Scale:  [place] ED from 07/21/2022 in [facility] Admission (Discharged) from OP Visit from 07/12/2022 in [place] [facility] OP Visit from 07/01/2022 in [place] No Risk High Risk Low Risk       AIMS:  , , ,  ,   ASAM:    Substance Abuse:     Past Medical History:  Past Medical History:  Diagnosis Date   Asthma    Bipolar 1 disorder, mixed ([place])    Depression    Generalized anxiety disorder    Intentional drug overdose ([place]) 11/03/2020   Low-lying placenta 01/07/2022   Resolved 03/04/22   Relationship dysfunction    Suicide attempt ([place]) 11/02/2020    Past Surgical History:  Procedure Laterality Date   PILONIDAL CYST / SINUS EXCISION  09/11/2013   PILONIDAL CYST EXCISION  05/17/2014   Pilonidal cystectomy with cleft lip   Family History:  Family History  Problem Relation Age of Onset   Asthma Mother    Diabetes Mother    Healthy Mother    Hypertension Father    Asthma Father    Diabetes Father    Healthy Father    Asthma Brother    Hypertension Paternal Uncle    Diabetes Paternal Grandmother    Stroke  Paternal Grandfather    Heart disease Paternal Grandfather    Hypertension Paternal Grandfather    Diabetes Paternal Grandfather     Social History:  Social History   Substance and Sexual Activity  Alcohol Use Not Currently   Comment: occasional prior to pregnancy     Social History   Substance and Sexual Activity  Drug Use Not Currently   Frequency: 4.0 times per week   Types: Marijuana   Comment: No Delta 9 since February 2023    Social History   Socioeconomic History   Marital  status: Single    Spouse name: Not on file   Number of children: 1   Years of education: 10   Highest education level: 10th grade  Occupational History   Not on file  Tobacco Use   Smoking status: Former    Packs/day: 1.00    Years: 15.00    Total pack years: 15.00    Types: Cigarettes    Quit date: 07/14/2021    Years since quitting: 1.0   Smokeless tobacco: Never   Tobacco comments:    only smoke a "couple" of cigarettes when stressed or anxious, socially with friends per [facility] chart  Vaping Use   Vaping Use: Former   Start date: 08/10/2003   Quit date: 05/04/2021  Substance and Sexual Activity   Alcohol use: Not Currently    Comment: occasional prior to pregnancy   Drug use: Not Currently    Frequency: 4.0 times per week    Types: Marijuana    Comment: No Delta 9 since February 2023   Sexual activity: Not Currently    Partners: Male    Birth control/protection: None  Other Topics Concern   Not on file  Social History Narrative   Not on file   Social Determinants of Health   Financial Resource Strain: Not on file  Food Insecurity: Food Insecurity Present (05/29/2022)   Hunger Vital Sign    Worried About [place] in the Last Year: Sometimes true    Ran Out of Food in the Last Year: Never true  Transportation Needs: No Transportation Needs (05/29/2022)   PRAPARE - Hydrologist (Medical): No    Lack of Transportation (Non-Medical): No  Physical Activity: Not on file  Stress: Not on file  Social Connections: Not on file      Allergies:   Allergies  Allergen Reactions   Ascorbate Rash   Citrus Rash   Coconut Flavor Rash   Lamotrigine Rash   Orange (Diagnostic) Rash   Peach Flavor Rash   Pear Rash   Pineapple Rash    Labs:  Results for orders placed or performed during the hospital encounter of 07/28/22 (from the past 48 hour(s))  Comprehensive metabolic panel     Status: Abnormal   Collection Time: 07/28/22 11:07 PM   Result Value Ref Range   Sodium 141 135 - 145 mmol/L   Potassium 3.1 (L) 3.5 - 5.1 mmol/L   Chloride 114 (H) 98 - 111 mmol/L   CO2 21 (L) 22 - 32 mmol/L   Glucose, Bld 84 70 - 99 mg/dL    Comment: Glucose reference range applies only to samples taken after fasting for at least 8 hours.   BUN 13 6 - 20 mg/dL   Creatinine, Ser 0.67 0.44 - 1.00 mg/dL   Calcium 7.8 (L) 8.9 - 10.3 mg/dL   Total Protein 5.7 (L) 6.5 - 8.1 g/dL   Albumin  3.4 (L) 3.5 - 5.0 g/dL   AST 22 15 - 41 U/L   ALT 25 0 - 44 U/L   Alkaline Phosphatase 52 38 - 126 U/L   Total Bilirubin 0.4 0.3 - 1.2 mg/dL   GFR, Estimated >62 >70 mL/min    Comment: (NOTE) Calculated using the CKD-EPI Creatinine Equation (2021)    Anion gap 6 5 - 15    Comment: Performed at [facility], [address] [place], [place] [address]  Ethanol     Status: None   Collection Time: 07/28/22 11:07 PM  Result Value Ref Range   Alcohol, Ethyl (B) <10 <10 mg/dL    Comment: (NOTE) Lowest detectable limit for serum alcohol is 10 mg/dL.  For medical purposes only. Performed at [facility], [address], [place], [place] [address]   CBC with Diff     Status: None   Collection Time: 07/28/22 11:07 PM  Result Value Ref Range   WBC 8.8 4.0 - 10.5 K/uL   RBC 4.15 3.87 - 5.11 MIL/uL   Hemoglobin 12.5 12.0 - 15.0 g/dL   HCT 99.3 71.6 - 96.7 %   MCV 92.5 80.0 - 100.0 fL   MCH 30.1 26.0 - 34.0 pg   MCHC 32.6 30.0 - 36.0 g/dL   RDW 89.3 81.0 - 17.5 %   Platelets 260 150 - 400 K/uL   nRBC 0.0 0.0 - 0.2 %   Neutrophils Relative % 57 %   Neutro Abs 5.1 1.7 - 7.7 K/uL   Lymphocytes Relative 32 %   Lymphs Abs 2.8 0.7 - 4.0 K/uL   Monocytes Relative 8 %   Monocytes Absolute 0.7 0.1 - 1.0 K/uL   Eosinophils Relative 1 %   Eosinophils Absolute 0.1 0.0 - 0.5 K/uL   Basophils Relative 1 %   Basophils Absolute 0.0 0.0 - 0.1 K/uL   Immature Granulocytes 1 %   Abs Immature Granulocytes 0.04 0.00 - 0.07 K/uL     Comment: Performed at [facility], [address] [place], [place] [address]  Salicylate level     Status: Abnormal   Collection Time: 07/28/22 11:07 PM  Result Value Ref Range   Salicylate Lvl <7.0 (L) 7.0 - 30.0 mg/dL    Comment: Performed at [facility], [address] [place], [place] [address]  Acetaminophen level     Status: Abnormal   Collection Time: 07/28/22 11:07 PM  Result Value Ref Range   Acetaminophen (Tylenol), Serum <10 (L) 10 - 30 ug/mL    Comment: (NOTE) Therapeutic concentrations vary significantly. A range of 10-30 ug/mL  may be an effective concentration for many patients. However, some  are best treated at concentrations outside of this range. Acetaminophen concentrations >150 ug/mL at 4 hours after ingestion  and >50 ug/mL at 12 hours after ingestion are often associated with  toxic reactions.  Performed at [facility], [address] [place], [place] [address]   I-Stat beta hCG blood, ED     Status: None   Collection Time: 07/28/22 11:10 PM  Result Value Ref Range   I-stat hCG, quantitative <5.0 <5 mIU/mL   Comment 3            Comment:   GEST. AGE      CONC.  (mIU/mL)   <=1 WEEK        5 - 50     2 WEEKS       50 -  500     3 WEEKS       100 - 10,000     4 WEEKS     1,000 - 30,000        FEMALE AND NON-PREGNANT FEMALE:     LESS THAN 5 mIU/mL   Magnesium     Status: None   Collection Time: 07/28/22 11:48 PM  Result Value Ref Range   Magnesium 2.2 1.7 - 2.4 mg/dL    Comment: Performed at [facility], [address], [place], [place] [address]  Urine rapid drug screen (hosp performed)     Status: None   Collection Time: 07/29/22  2:54 AM  Result Value Ref Range   Opiates NONE DETECTED NONE DETECTED   Cocaine NONE DETECTED NONE DETECTED   Benzodiazepines NONE DETECTED NONE DETECTED   Amphetamines NONE DETECTED NONE DETECTED   Tetrahydrocannabinol NONE DETECTED NONE  DETECTED   Barbiturates NONE DETECTED NONE DETECTED    Comment: (NOTE) DRUG SCREEN FOR MEDICAL PURPOSES ONLY.  IF CONFIRMATION IS NEEDED FOR ANY PURPOSE, NOTIFY LAB WITHIN 5 DAYS.  LOWEST DETECTABLE LIMITS FOR URINE DRUG SCREEN Drug Class                     Cutoff (ng/mL) Amphetamine and metabolites    1000 Barbiturate and metabolites    200 Benzodiazepine                 200 Opiates and metabolites        300 Cocaine and metabolites        300 THC                            50 Performed at [facility], [address] [place], [place] [address]   Urinalysis, Routine w reflex microscopic     Status: Abnormal   Collection Time: 07/29/22  2:54 AM  Result Value Ref Range   Color, Urine YELLOW YELLOW   APPearance HAZY (A) CLEAR   Specific Gravity, Urine 1.010 1.005 - 1.030   pH 6.0 5.0 - 8.0   Glucose, UA NEGATIVE NEGATIVE mg/dL   Hgb urine dipstick NEGATIVE NEGATIVE   Bilirubin Urine NEGATIVE NEGATIVE   Ketones, ur NEGATIVE NEGATIVE mg/dL   Protein, ur NEGATIVE NEGATIVE mg/dL   Nitrite NEGATIVE NEGATIVE   Leukocytes,Ua MODERATE (A) NEGATIVE   RBC / HPF 0-5 0 - 5 RBC/hpf   WBC, UA 11-20 0 - 5 WBC/hpf   Bacteria, UA FEW (A) NONE SEEN   Squamous Epithelial / LPF 21-50 0 - 5   Mucus PRESENT     Comment: Performed at [facility], [address] [place], [place] [address]  Basic metabolic panel     Status: Abnormal   Collection Time: 07/29/22  3:49 AM  Result Value Ref Range   Sodium 140 135 - 145 mmol/L   Potassium 4.1 3.5 - 5.1 mmol/L   Chloride 110 98 - 111 mmol/L   CO2 23 22 - 32 mmol/L   Glucose, Bld 100 (H) 70 - 99 mg/dL    Comment: Glucose reference range applies only to samples taken after fasting for at least 8 hours.   BUN 13 6 - 20 mg/dL   Creatinine, Ser 1.01 0.44 - 1.00 mg/dL   Calcium 8.5 (L) 8.9 - 10.3 mg/dL   GFR, Estimated >75 >10 mL/min    Comment: (NOTE) Calculated using the CKD-EPI Creatinine  Equation (2021)     Anion gap 7 5 - 15    Comment: Performed at [facility], [place] [address] [place], [place] [address]  Acetaminophen level     Status: Abnormal   Collection Time: 07/29/22  3:49 AM  Result Value Ref Range   Acetaminophen (Tylenol), Serum <10 (L) 10 - 30 ug/mL    Comment: (NOTE) Therapeutic concentrations vary significantly. A range of 10-30 ug/mL  may be an effective concentration for many patients. However, some  are best treated at concentrations outside of this range. Acetaminophen concentrations >150 ug/mL at 4 hours after ingestion  and >50 ug/mL at 12 hours after ingestion are often associated with  toxic reactions.  Performed at [facility], [place] [address] [place], [place] [address]     No current facility-administered medications for this encounter.   Current Outpatient Medications  Medication Sig Dispense Refill   ARIPiprazole (ABILIFY) 5 MG tablet Take 1 tablet (5 mg total) by mouth daily. 30 tablet 0   FLUoxetine (PROZAC) 10 MG capsule Take 1 capsule (10 mg total) by mouth daily. 30 capsule 0   hydrOXYzine (ATARAX) 25 MG tablet Take 1 tablet (25 mg total) by mouth 3 (three) times daily as needed for anxiety. 30 tablet 0   traZODone (DESYREL) 50 MG tablet Take 1 tablet (50 mg total) by mouth at bedtime as needed for sleep. 30 tablet 0   benzonatate (TESSALON) 100 MG capsule Take 1 capsule (100 mg total) by mouth every 8 (eight) hours. (Patient not taking: Reported on 07/29/2022) 21 capsule 0   docusate sodium (COLACE) 100 MG capsule Take 1 capsule (100 mg total) by mouth daily as needed. 30 capsule 2   neomycin-bacitracin-polymyxin 3.5-2812064291 OINT Apply 1 Application topically 2 (two) times daily. (Patient not taking: Reported on 07/29/2022) 15 g 0   nicotine (NICODERM CQ - DOSED IN MG/24 HOURS) 14 mg/24hr patch Place 1 patch (14 mg total) onto the skin daily. 28 patch 0   nitrofurantoin, macrocrystal-monohydrate, (MACROBID) 100 MG  capsule Take 1 capsule (100 mg total) by mouth every 12 (twelve) hours. (Patient not taking: Reported on 07/29/2022) 10 capsule 0   predniSONE (DELTASONE) 20 MG tablet Take 2 tablets (40 mg total) by mouth daily. (Patient not taking: Reported on 07/29/2022) 10 tablet 0   promethazine-dextromethorphan (PROMETHAZINE-DM) 6.25-15 MG/5ML syrup Take 5 mLs by mouth 4 (four) times daily as needed for cough. (Patient not taking: Reported on 07/29/2022) 118 mL 0    Musculoskeletal: Strength & Muscle Tone: within normal limits Gait & Station: normal Patient leans: N/A   Psychiatric Specialty Exam: Presentation  General Appearance:  Appropriate for Environment  Eye Contact: Good  Speech: Clear and Coherent  Speech Volume: Normal  Handedness: Right   Mood and Affect  Mood: Euthymic  Affect: Appropriate   Thought Process  Thought Processes: Coherent  Descriptions of Associations:Intact  Orientation:Full (Time, Place and Person)  Thought Content:WDL  History of Schizophrenia/Schizoaffective disorder:No  Duration of Psychotic Symptoms:N/A  Hallucinations:Hallucinations: None  Ideas of Reference:None  Suicidal Thoughts:Suicidal Thoughts: Yes, Active SI Active Intent and/or Plan: With Intent  Homicidal Thoughts:Homicidal Thoughts: No   Sensorium  Memory: Immediate Good  Judgment: Poor  Insight: Fair   Materials engineer: Fair  Attention Span: Fair  Recall: [employer] of Knowledge: Fair  Language: Good   Psychomotor Activity  Psychomotor Activity: Psychomotor Activity: Normal   Assets  Assets: Communication Skills; Housing    Sleep  Sleep:  Sleep: Fair   Physical Exam: Physical Exam HENT:     Head: Normocephalic.  Musculoskeletal:        General: Normal range of motion.     Cervical back: Normal range of motion.  Neurological:     Mental Status: She is alert.  Psychiatric:        Attention and Perception:  Attention normal.        Mood and Affect: Mood is anxious. Affect is flat.        Speech: Speech normal.        Behavior: Behavior is cooperative.        Thought Content: Thought content includes suicidal ideation.        Cognition and Memory: Cognition normal.        Judgment: Judgment is impulsive.    Review of Systems  Constitutional: Negative.   Gastrointestinal: Negative.   Musculoskeletal: Negative.   Psychiatric/Behavioral:  Positive for depression and suicidal ideas.    Blood pressure 115/62, pulse 80, temperature 98 F (36.7 C), temperature source Oral, resp. rate 17, height 5\' 3"  (1.6 m), last menstrual period 07/21/2022, SpO2 95 %, not currently breastfeeding. Body mass index is 38.26 kg/m.  Medical Decision Making: Per admission assessment: patient here for taking aripiprazole, hydroxyzine, trazodone, and metronidazole.  Patient had those refilled about a day ago so they were full.  Patient admitted she took those for intentional self harm. Recommending Inpatient admission, will talk with social work.   Disposition: Recommend psychiatric Inpatient admission when medically cleared.  Michaele Offer, PMHNP 07/29/2022 1:31 PM

## 2022-07-29 NOTE — ED Provider Notes (Signed)
12:12 AM Patient signed out to me by previous ED physician. Pt is a 26 yo female presenting for intentional OD on Aripiprazole, Hydroxyzine, Trazadone, and Metronidazole.   Need to complete IVC Poison control recommends repeat 4 hr tylenol and repeat ECG at 2 hour mark.  Receiving mag and K Monitor for 10 hrs     Physical Exam  BP 122/60   Pulse 80   Temp 98.7 F (37.1 C) (Oral)   Resp 13   LMP 07/21/2022   SpO2 95%   Physical Exam Vitals and nursing note reviewed.  Constitutional:      Appearance: Normal appearance.  HENT:     Head: Normocephalic and atraumatic.  Cardiovascular:     Rate and Rhythm: Normal rate and regular rhythm.  Pulmonary:     Effort: Pulmonary effort is normal.     Breath sounds: Normal breath sounds.  Neurological:     Mental Status: She is alert and oriented to person, place, and time.     GCS: GCS eye subscore is 4. GCS verbal subscore is 5. GCS motor subscore is 6.  Psychiatric:        Thought Content: Thought content includes suicidal ideation.     Procedures  Procedures  ED Course / MDM   Clinical Course as of 07/29/22 0012  Wed Jul 28, 2022  2328 Discussed with poison control.  They feel this is mostly to be CNS depression although cannot see QTc prolongation and seizure risk low blood pressure.  Abilify would require 10-hour observation.  Main recommendations are repeat acetaminophen level at 4 hours along with repeat EKG.  Keep potassium greater than 4 and magnesium greater than 2. [MB]    Clinical Course User Index [MB] Terrilee Files, MD   Medical Decision Making Amount and/or Complexity of Data Reviewed Labs: ordered.  Risk Prescription drug management.   IVC completed.  Repeat ECG and tylenol pending  6:10 AM Repeat tylenol <10 Repeat ECG improved T wave abnormalities. Minimal improvement of QTC form 543 to 538.  Patient observation status at 7 hours. Will continue to monitor till 9AM at 10 hour mark and medically  clear patient for psychiatric evaluation if continued stability.    Pt signed out to oncoming provider while awaiting medical clearance.     Franne Forts, DO 07/29/22 [phone]

## 2022-07-29 NOTE — ED Notes (Signed)
Security wanded pt ?

## 2022-07-29 NOTE — ED Notes (Signed)
EKG completed per poison control

## 2022-07-30 ENCOUNTER — Other Ambulatory Visit: Payer: Self-pay

## 2022-07-30 ENCOUNTER — Encounter (HOSPITAL_COMMUNITY): Payer: Self-pay | Admitting: Psychiatry

## 2022-07-30 ENCOUNTER — Inpatient Hospital Stay (HOSPITAL_COMMUNITY)
Admission: AD | Admit: 2022-07-30 | Discharge: 2022-08-05 | DRG: 885 | Disposition: A | Discharge status: Home / Self Care | Payer: Medicaid Other | Source: Intra-hospital | Attending: Psychiatry | Admitting: Psychiatry

## 2022-07-30 DIAGNOSIS — F4310 Post-traumatic stress disorder, unspecified: Secondary | ICD-10-CM | POA: Diagnosis present

## 2022-07-30 DIAGNOSIS — Z9152 Personal history of nonsuicidal self-harm: Secondary | ICD-10-CM | POA: Diagnosis not present

## 2022-07-30 DIAGNOSIS — F4312 Post-traumatic stress disorder, chronic: Secondary | ICD-10-CM | POA: Diagnosis present

## 2022-07-30 DIAGNOSIS — Z833 Family history of diabetes mellitus: Secondary | ICD-10-CM

## 2022-07-30 DIAGNOSIS — Z79899 Other long term (current) drug therapy: Secondary | ICD-10-CM

## 2022-07-30 DIAGNOSIS — Z823 Family history of stroke: Secondary | ICD-10-CM

## 2022-07-30 DIAGNOSIS — F329 Major depressive disorder, single episode, unspecified: Secondary | ICD-10-CM | POA: Insufficient documentation

## 2022-07-30 DIAGNOSIS — Z818 Family history of other mental and behavioral disorders: Secondary | ICD-10-CM

## 2022-07-30 DIAGNOSIS — J45909 Unspecified asthma, uncomplicated: Secondary | ICD-10-CM | POA: Diagnosis present

## 2022-07-30 DIAGNOSIS — F3181 Bipolar II disorder: Principal | ICD-10-CM | POA: Diagnosis present

## 2022-07-30 DIAGNOSIS — Z87891 Personal history of nicotine dependence: Secondary | ICD-10-CM

## 2022-07-30 DIAGNOSIS — Z87730 Personal history of (corrected) cleft lip and palate: Secondary | ICD-10-CM

## 2022-07-30 DIAGNOSIS — Z9151 Personal history of suicidal behavior: Secondary | ICD-10-CM

## 2022-07-30 DIAGNOSIS — Z8249 Family history of ischemic heart disease and other diseases of the circulatory system: Secondary | ICD-10-CM

## 2022-07-30 DIAGNOSIS — Z814 Family history of other substance abuse and dependence: Secondary | ICD-10-CM

## 2022-07-30 DIAGNOSIS — Z825 Family history of asthma and other chronic lower respiratory diseases: Secondary | ICD-10-CM | POA: Diagnosis not present

## 2022-07-30 DIAGNOSIS — Z62810 Personal history of physical and sexual abuse in childhood: Secondary | ICD-10-CM | POA: Diagnosis not present

## 2022-07-30 DIAGNOSIS — Z91018 Allergy to other foods: Secondary | ICD-10-CM | POA: Diagnosis not present

## 2022-07-30 DIAGNOSIS — F411 Generalized anxiety disorder: Secondary | ICD-10-CM | POA: Diagnosis present

## 2022-07-30 DIAGNOSIS — Z1152 Encounter for screening for COVID-19: Secondary | ICD-10-CM

## 2022-07-30 DIAGNOSIS — R9431 Abnormal electrocardiogram [ECG] [EKG]: Secondary | ICD-10-CM | POA: Diagnosis present

## 2022-07-30 DIAGNOSIS — R45851 Suicidal ideations: Secondary | ICD-10-CM

## 2022-07-30 LAB — RESP PANEL BY RT-PCR (RSV, FLU A&B, COVID)  RVPGX2
Influenza A by PCR: NEGATIVE
Influenza B by PCR: NEGATIVE
Resp Syncytial Virus by PCR: NEGATIVE
SARS Coronavirus 2 by RT PCR: NEGATIVE

## 2022-07-30 LAB — CBC
HCT: 43.6 % (ref 36.0–46.0)
Hemoglobin: 14 g/dL (ref 12.0–15.0)
MCH: 30 pg (ref 26.0–34.0)
MCHC: 32.1 g/dL (ref 30.0–36.0)
MCV: 93.6 fL (ref 80.0–100.0)
Platelets: 289 10*3/uL (ref 150–400)
RBC: 4.66 MIL/uL (ref 3.87–5.11)
RDW: 13.2 % (ref 11.5–15.5)
WBC: 8.8 10*3/uL (ref 4.0–10.5)
nRBC: 0 % (ref 0.0–0.2)

## 2022-07-30 LAB — TSH: TSH: 2.904 u[IU]/mL (ref 0.350–4.500)

## 2022-07-30 MED ORDER — ALUM & MAG HYDROXIDE-SIMETH 200-200-20 MG/5ML PO SUSP: 30.0000 mL | ORAL | Status: DC | PRN

## 2022-07-30 MED ORDER — ACETAMINOPHEN 325 MG PO TABS
650.0000 mg | ORAL_TABLET | Freq: Four times a day (QID) | ORAL | Status: DC | PRN
  Administered 2022-08-01: 650 mg via ORAL
  Filled 2022-07-30: qty 2

## 2022-07-30 MED ORDER — MAGNESIUM HYDROXIDE 400 MG/5ML PO SUSP: 30.0000 mL | Freq: Every day | ORAL | Status: DC | PRN

## 2022-07-30 MED ORDER — DIPHENHYDRAMINE HCL 25 MG PO CAPS
25.0000 mg | ORAL_CAPSULE | Freq: Four times a day (QID) | ORAL | Status: DC | PRN
  Administered 2022-07-31: 25 mg via ORAL
  Filled 2022-07-30: qty 1

## 2022-07-30 MED ORDER — HYDROXYZINE HCL 25 MG PO TABS: 50.0000 mg | ORAL_TABLET | Freq: Once | ORAL | Status: DC

## 2022-07-30 MED ORDER — NICOTINE 14 MG/24HR TD PT24
14.0000 mg | MEDICATED_PATCH | Freq: Every day | TRANSDERMAL | Status: DC
  Administered 2022-07-31: 14 mg via TRANSDERMAL
  Filled 2022-07-30 (×3): qty 1

## 2022-07-30 NOTE — Progress Notes (Addendum)
Pt was accepted to [facility] TODAY. Bed assignment: 305-1  Pt meets inpatient criteria per Alona Bene, PMHNP  Attending Physician will be Phineas Inches, MD  Report can be called to: -Adult unit: [phone]  Pt can arrive after 2:30 PM  Care Team Notified: [facility] Sharyne Peach, RN, Alona Bene, PMHNP, and [address], LCSWA  [place], [place]  07/30/2022 9:03 AM

## 2022-07-30 NOTE — ED Provider Notes (Addendum)
Patient medically cleared yesterday.  Was cleared for behavioral health admission.  Apparently there is a bed available for behavioral health admission today.  Dr. Jearld Fenton cleared the patient medically.   Sharon Mulders, MD 07/30/22 [phone]   Apparently patient's IVC paperwork was rejected because it was not notarized.  They are requiring that it be completely refilled out.  Will do that.  This should not interfere with her psych admission.   Sharon Mulders, MD 07/30/22 1151

## 2022-07-30 NOTE — Progress Notes (Signed)
[facility] Psych ED Progress Note  07/30/2022 10:58 AM Sharon Ward  MRN:  [id number]   Principal Problem: Suicidal ideation Diagnosis:  Principal Problem:   Suicidal ideation Active Problems:   Overdose   GAD (generalized anxiety disorder)   ED Assessment Time Calculation: Start Time: 1015 Stop Time: 1035 Total Time in Minutes (Assessment Completion): 20   HPI:  Per admission assessment: patient here for taking aripiprazole, hydroxyzine, trazodone, and metronidazole.  Patient had those refilled about a day ago so they were full.  Patient admitted she took those for intentional self harm.    Subjective: Sharon Ward, 26 y.o., female patient seen face to face by this provider, and chart reviewed on 07/30/22. On evaluation Sharon Ward denies HI/AVH, continues to endorse SI says that she received a call on Wednesday from [facility] and there is a possibility her 11 yr old daughter will not be coming back to live with patient. Patient says that the 24 yr old has been in [place] custody for a year and she and boyfriend have been compliant with the rules and DSS has been saying the daughter will come back home, now she may not. Patient became tearful and stated, "I don't understand they let me keep my 6 month old but wont give me my other daughter, she also feels the foster parents are not treating her daughter right. Patient stated after the DSS call on Wednesday her boyfriend took her 21 month old out, to give patient some time alone and that's when she says she took the pills.  During evaluation Sharon Ward is sitting on her hospital bed in no acute distress. She is alert, oriented x 4, for most part patient is calm, but when talking about her 24 yr old she has some anxiety, she is cooperative and attentive, tearful at times. Her mood is euthymic with flat/sad affect.  She has normal speech, and behavior.  Objectively there is no evidence of psychosis/mania or delusional thinking. Patient is able to  converse coherently, goal directed thoughts, no distractibility, or pre-occupation. She also denies suicidal/self-harm/homicidal ideation, psychosis, and paranoia. Patient answered question appropriately.     Past Psychiatric History: Bipolar disorder, borderline personality, ODD, GAD, patient has multiple mental health related hospitalizations.    [place] Scale:  [place] ED from 07/21/2022 in [facility] Admission (Discharged) from OP Visit from 07/12/2022 in [facility] OP Visit from 07/01/2022 in [place] No Risk High Risk Low Risk       Past Medical History:  Past Medical History:  Diagnosis Date   Asthma    Bipolar 1 disorder, mixed ([place])    Depression    Generalized anxiety disorder    Intentional drug overdose ([place]) 11/03/2020   Low-lying placenta 01/07/2022   Resolved 03/04/22   Relationship dysfunction    Suicide attempt ([place]) 11/02/2020    Past Surgical History:  Procedure Laterality Date   PILONIDAL CYST / SINUS EXCISION  09/11/2013   PILONIDAL CYST EXCISION  05/17/2014   Pilonidal cystectomy with cleft lip   Family History:  Family History  Problem Relation Age of Onset   Asthma Mother    Diabetes Mother    Healthy Mother    Hypertension Father    Asthma Father    Diabetes Father    Healthy Father    Asthma Brother    Hypertension Paternal Uncle    Diabetes Paternal Grandmother  Stroke Paternal Grandfather    Heart disease Paternal Grandfather    Hypertension Paternal Grandfather    Diabetes Paternal Grandfather      Social History:  Social History   Substance and Sexual Activity  Alcohol Use Not Currently   Comment: occasional prior to pregnancy     Social History   Substance and Sexual Activity  Drug Use Not Currently   Frequency: 4.0 times per week   Types: Marijuana   Comment: No Delta 9 since February 2023     Social History   Socioeconomic History   Marital status: Single    Spouse name: Not on file   Number of children: 1   Years of education: 10   Highest education level: 10th grade  Occupational History   Not on file  Tobacco Use   Smoking status: Former    Packs/day: 1.00    Years: 15.00    Total pack years: 15.00    Types: Cigarettes    Quit date: 07/14/2021    Years since quitting: 1.0   Smokeless tobacco: Never   Tobacco comments:    only smoke a "couple" of cigarettes when stressed or anxious, socially with friends per [facility] chart  Vaping Use   Vaping Use: Former   Start date: 08/10/2003   Quit date: 05/04/2021  Substance and Sexual Activity   Alcohol use: Not Currently    Comment: occasional prior to pregnancy   Drug use: Not Currently    Frequency: 4.0 times per week    Types: Marijuana    Comment: No Delta 9 since February 2023   Sexual activity: Not Currently    Partners: Male    Birth control/protection: None  Other Topics Concern   Not on file  Social History Narrative   Not on file   Social Determinants of Health   Financial Resource Strain: Not on file  Food Insecurity: Food Insecurity Present (05/29/2022)   Hunger Vital Sign    Worried About [place] in the Last Year: Sometimes true    Ran Out of Food in the Last Year: Never true  Transportation Needs: No Transportation Needs (05/29/2022)   PRAPARE - Hydrologist (Medical): No    Lack of Transportation (Non-Medical): No  Physical Activity: Not on file  Stress: Not on file  Social Connections: Not on file    Sleep: Fair  Appetite:  Fair  Current Medications: Current Facility-Administered Medications  Medication Dose Route Frequency Provider Last Rate Last Admin   hydrOXYzine (ATARAX) tablet 50 mg  50 mg Oral Once Motley-Mangrum, Donneta Romberg A, PMHNP       Current Outpatient Medications  Medication Sig Dispense Refill   ARIPiprazole (ABILIFY) 5 MG tablet Take 1  tablet (5 mg total) by mouth daily. 30 tablet 0   FLUoxetine (PROZAC) 10 MG capsule Take 1 capsule (10 mg total) by mouth daily. 30 capsule 0   hydrOXYzine (ATARAX) 25 MG tablet Take 1 tablet (25 mg total) by mouth 3 (three) times daily as needed for anxiety. 30 tablet 0   traZODone (DESYREL) 50 MG tablet Take 1 tablet (50 mg total) by mouth at bedtime as needed for sleep. 30 tablet 0   benzonatate (TESSALON) 100 MG capsule Take 1 capsule (100 mg total) by mouth every 8 (eight) hours. (Patient not taking: Reported on 07/29/2022) 21 capsule 0   docusate sodium (COLACE) 100 MG capsule Take 1 capsule (100 mg total) by mouth daily as needed. [place]  capsule 2   neomycin-bacitracin-polymyxin 3.5-218 [id number] OINT Apply 1 Application topically 2 (two) times daily. (Patient not taking: Reported on 07/29/2022) 15 g 0   nicotine (NICODERM CQ - DOSED IN MG/24 HOURS) 14 mg/24hr patch Place 1 patch (14 mg total) onto the skin daily. 28 patch 0   nitrofurantoin, macrocrystal-monohydrate, (MACROBID) 100 MG capsule Take 1 capsule (100 mg total) by mouth every 12 (twelve) hours. (Patient not taking: Reported on 07/29/2022) 10 capsule 0   predniSONE (DELTASONE) 20 MG tablet Take 2 tablets (40 mg total) by mouth daily. (Patient not taking: Reported on 07/29/2022) 10 tablet 0   promethazine-dextromethorphan (PROMETHAZINE-DM) 6.25-15 MG/5ML syrup Take 5 mLs by mouth 4 (four) times daily as needed for cough. (Patient not taking: Reported on 07/29/2022) 118 mL 0    Lab Results:  Results for orders placed or performed during the hospital encounter of 07/28/22 (from the past 48 hour(s))  Comprehensive metabolic panel     Status: Abnormal   Collection Time: 07/28/22 11:07 PM  Result Value Ref Range   Sodium 141 135 - 145 mmol/L   Potassium 3.1 (L) 3.5 - 5.1 mmol/L   Chloride 114 (H) 98 - 111 mmol/L   CO2 21 (L) 22 - 32 mmol/L   Glucose, Bld 84 70 - 99 mg/dL    Comment: Glucose reference range applies only to samples taken  after fasting for at least 8 hours.   BUN 13 6 - 20 mg/dL   Creatinine, Ser 0.67 0.44 - 1.00 mg/dL   Calcium 7.8 (L) 8.9 - 10.3 mg/dL   Total Protein 5.7 (L) 6.5 - 8.1 g/dL   Albumin 3.4 (L) 3.5 - 5.0 g/dL   AST 22 15 - 41 U/L   ALT 25 0 - 44 U/L   Alkaline Phosphatase 52 38 - 126 U/L   Total Bilirubin 0.4 0.3 - 1.2 mg/dL   GFR, Estimated >60 >60 mL/min    Comment: (NOTE) Calculated using the CKD-EPI Creatinine Equation (2021)    Anion gap 6 5 - 15    Comment: Performed at [facility], [place] [address] [place], [place] [address]  Ethanol     Status: None   Collection Time: 07/28/22 11:07 PM  Result Value Ref Range   Alcohol, Ethyl (B) <10 <10 mg/dL    Comment: (NOTE) Lowest detectable limit for serum alcohol is 10 mg/dL.  For medical purposes only. Performed at [facility], [place] [address], [place], [place] [address]   CBC with Diff     Status: None   Collection Time: 07/28/22 11:07 PM  Result Value Ref Range   WBC 8.8 4.0 - 10.5 K/uL   RBC 4.15 3.87 - 5.11 MIL/uL   Hemoglobin 12.5 12.0 - 15.0 g/dL   HCT 38.4 36.0 - 46.0 %   MCV 92.5 80.0 - 100.0 fL   MCH 30.1 26.0 - 34.0 pg   MCHC 32.6 30.0 - 36.0 g/dL   RDW 12.9 11.5 - 15.5 %   Platelets 260 150 - 400 K/uL   nRBC 0.0 0.0 - 0.2 %   Neutrophils Relative % 57 %   Neutro Abs 5.1 1.7 - 7.7 K/uL   Lymphocytes Relative 32 %   Lymphs Abs 2.8 0.7 - 4.0 K/uL   Monocytes Relative 8 %   Monocytes Absolute 0.7 0.1 - 1.0 K/uL   Eosinophils Relative 1 %   Eosinophils Absolute 0.1 0.0 - 0.5 K/uL   Basophils Relative 1 %   Basophils Absolute  0.0 0.0 - 0.1 K/uL   Immature Granulocytes 1 %   Abs Immature Granulocytes 0.04 0.00 - 0.07 K/uL    Comment: Performed at [facility], [place] [address] [place], [place] [id number]  Salicylate level     Status: Abnormal   Collection Time: 07/28/22 11:07 PM  Result Value Ref Range   Salicylate Lvl [id number] (L) 7.0 - 30.0 mg/dL    Comment:  Performed at [facility], [place] [address], [place], [place] [address]  Acetaminophen level     Status: Abnormal   Collection Time: 07/28/22 11:07 PM  Result Value Ref Range   Acetaminophen (Tylenol), Serum <10 (L) 10 - 30 ug/mL    Comment: (NOTE) Therapeutic concentrations vary significantly. A range of 10-30 ug/mL  may be an effective concentration for many patients. However, some  are best treated at concentrations outside of this range. Acetaminophen concentrations >150 ug/mL at 4 hours after ingestion  and >50 ug/mL at 12 hours after ingestion are often associated with  toxic reactions.  Performed at [facility], [place] [address], [place], [place] [address]   I-Stat beta hCG blood, ED     Status: None   Collection Time: 07/28/22 11:10 PM  Result Value Ref Range   I-stat hCG, quantitative <5.0 <5 mIU/mL   Comment 3            Comment:   GEST. AGE      CONC.  (mIU/mL)   <=1 WEEK        5 - 50     2 WEEKS       50 - 500     3 WEEKS       100 - 10,000     4 WEEKS     1,000 - 30,000        FEMALE AND NON-PREGNANT FEMALE:     LESS THAN 5 mIU/mL   Magnesium     Status: None   Collection Time: 07/28/22 11:48 PM  Result Value Ref Range   Magnesium 2.2 1.7 - 2.4 mg/dL    Comment: Performed at [facility], [place] [address] [place], [place] [address]  Urine rapid drug screen (hosp performed)     Status: None   Collection Time: 07/29/22  2:54 AM  Result Value Ref Range   Opiates NONE DETECTED NONE DETECTED   Cocaine NONE DETECTED NONE DETECTED   Benzodiazepines NONE DETECTED NONE DETECTED   Amphetamines NONE DETECTED NONE DETECTED   Tetrahydrocannabinol NONE DETECTED NONE DETECTED   Barbiturates NONE DETECTED NONE DETECTED    Comment: (NOTE) DRUG SCREEN FOR MEDICAL PURPOSES ONLY.  IF CONFIRMATION IS NEEDED FOR ANY PURPOSE, NOTIFY LAB WITHIN 5 DAYS.  LOWEST DETECTABLE LIMITS FOR URINE DRUG SCREEN Drug Class                      Cutoff (ng/mL) Amphetamine and metabolites    1000 Barbiturate and metabolites    200 Benzodiazepine                 200 Opiates and metabolites        300 Cocaine and metabolites        300 THC                            50 Performed at [facility], [place] [address] [place], [place] [address]   Urinalysis,  Routine w reflex microscopic     Status: Abnormal   Collection Time: 07/29/22  2:54 AM  Result Value Ref Range   Color, Urine YELLOW YELLOW   APPearance HAZY (A) CLEAR   Specific Gravity, Urine 1.010 1.005 - 1.030   pH 6.0 5.0 - 8.0   Glucose, UA NEGATIVE NEGATIVE mg/dL   Hgb urine dipstick NEGATIVE NEGATIVE   Bilirubin Urine NEGATIVE NEGATIVE   Ketones, ur NEGATIVE NEGATIVE mg/dL   Protein, ur NEGATIVE NEGATIVE mg/dL   Nitrite NEGATIVE NEGATIVE   Leukocytes,Ua MODERATE (A) NEGATIVE   RBC / HPF 0-5 0 - 5 RBC/hpf   WBC, UA 11-20 0 - 5 WBC/hpf   Bacteria, UA FEW (A) NONE SEEN   Squamous Epithelial / LPF 21-50 0 - 5   Mucus PRESENT     Comment: Performed at [facility], [address] [place], [place] [address]  Basic metabolic panel     Status: Abnormal   Collection Time: 07/29/22  3:49 AM  Result Value Ref Range   Sodium 140 135 - 145 mmol/L   Potassium 4.1 3.5 - 5.1 mmol/L   Chloride 110 98 - 111 mmol/L   CO2 23 22 - 32 mmol/L   Glucose, Bld 100 (H) 70 - 99 mg/dL    Comment: Glucose reference range applies only to samples taken after fasting for at least 8 hours.   BUN 13 6 - 20 mg/dL   Creatinine, Ser 6.60 0.44 - 1.00 mg/dL   Calcium 8.5 (L) 8.9 - 10.3 mg/dL   GFR, Estimated >63 >01 mL/min    Comment: (NOTE) Calculated using the CKD-EPI Creatinine Equation (2021)    Anion gap 7 5 - 15    Comment: Performed at [facility], [address] [place], [place] [address]  Acetaminophen level     Status: Abnormal   Collection Time: 07/29/22  3:49 AM  Result Value Ref Range   Acetaminophen (Tylenol), Serum  <10 (L) 10 - 30 ug/mL    Comment: (NOTE) Therapeutic concentrations vary significantly. A range of 10-30 ug/mL  may be an effective concentration for many patients. However, some  are best treated at concentrations outside of this range. Acetaminophen concentrations >150 ug/mL at 4 hours after ingestion  and >50 ug/mL at 12 hours after ingestion are often associated with  toxic reactions.  Performed at [facility], [address], [place], [place] [address]   Resp panel by RT-PCR (RSV, Flu A&B, Covid) Anterior Nasal Swab     Status: None   Collection Time: 07/29/22 10:47 PM   Specimen: Anterior Nasal Swab  Result Value Ref Range   SARS Coronavirus 2 by RT PCR NEGATIVE NEGATIVE    Comment: (NOTE) SARS-CoV-2 target nucleic acids are NOT DETECTED.  The SARS-CoV-2 RNA is generally detectable in upper respiratory specimens during the acute phase of infection. The lowest concentration of SARS-CoV-2 viral copies this assay can detect is 138 copies/mL. A negative result does not preclude SARS-Cov-2 infection and should not be used as the sole basis for treatment or other patient management decisions. A negative result may occur with  improper specimen collection/handling, submission of specimen other than nasopharyngeal swab, presence of viral mutation(s) within the areas targeted by this assay, and inadequate number of viral copies(<138 copies/mL). A negative result must be combined with clinical observations, patient history, and epidemiological information. The expected result is Negative.  Fact Sheet for Patients:  BloggerCourse.com  Fact Sheet for Healthcare Providers:  IncredibleEmployment.be  This test is no t yet approved or cleared by the [place] and  has been authorized for detection and/or diagnosis of SARS-CoV-2 by FDA under an Emergency Use Authorization (EUA). This EUA will remain  in effect  (meaning this test can be used) for the duration of the COVID-19 declaration under Section 564(b)(1) of the Act, 21 U.S.C.section 360bbb-3(b)(1), unless the authorization is terminated  or revoked sooner.       Influenza A by PCR NEGATIVE NEGATIVE   Influenza B by PCR NEGATIVE NEGATIVE    Comment: (NOTE) The Xpert Xpress SARS-CoV-2/FLU/RSV plus assay is intended as an aid in the diagnosis of influenza from Nasopharyngeal swab specimens and should not be used as a sole basis for treatment. Nasal washings and aspirates are unacceptable for Xpert Xpress SARS-CoV-2/FLU/RSV testing.  Fact Sheet for Patients: EntrepreneurPulse.com.au  Fact Sheet for Healthcare Providers: IncredibleEmployment.be  This test is not yet approved or cleared by the [place] FDA and has been authorized for detection and/or diagnosis of SARS-CoV-2 by FDA under an Emergency Use Authorization (EUA). This EUA will remain in effect (meaning this test can be used) for the duration of the COVID-19 declaration under Section 564(b)(1) of the Act, 21 U.S.C. section 360bbb-3(b)(1), unless the authorization is terminated or revoked.     Resp Syncytial Virus by PCR NEGATIVE NEGATIVE    Comment: (NOTE) Fact Sheet for Patients: EntrepreneurPulse.com.au  Fact Sheet for Healthcare Providers: IncredibleEmployment.be  This test is not yet approved or cleared by the [place] FDA and has been authorized for detection and/or diagnosis of SARS-CoV-2 by FDA under an Emergency Use Authorization (EUA). This EUA will remain in effect (meaning this test can be used) for the duration of the COVID-19 declaration under Section 564(b)(1) of the Act, 21 U.S.C. section 360bbb-3(b)(1), unless the authorization is terminated or revoked.  Performed at [facility], [place] [address] [place], [place] [address]     Blood Alcohol level:   Lab Results  Component Value Date   [facility] <10 07/28/2022   ETH <10 11/10/2021    Physical Findings:  CIWA:    COWS:     Musculoskeletal: Strength & Muscle Tone: within normal limits Gait & Station: normal Patient leans: N/A  Psychiatric Specialty Exam:  Presentation  General Appearance:  Appropriate for Environment  Eye Contact: Good  Speech: Clear and Coherent  Speech Volume: Normal  Handedness: Right   Mood and Affect  Mood: Anxious; Hopeless  Affect: Appropriate; Flat   Thought Process  Thought Processes: Coherent  Descriptions of Associations:Intact  Orientation:Full (Time, Place and Person)  Thought Content:WDL  History of Schizophrenia/Schizoaffective disorder:No  Duration of Psychotic Symptoms:N/A  Hallucinations:Hallucinations: None  Ideas of Reference:None  Suicidal Thoughts:Suicidal Thoughts: Yes, Passive SI Active Intent and/or Plan: Without Intent; Without Plan  Homicidal Thoughts:Homicidal Thoughts: No   Sensorium  Memory: Immediate Good; Remote Good  Judgment: Fair  Insight: Fair   Community education officer  Concentration: Good  Attention Span: Good  Recall: [place] of Knowledge: Fair  Language: Good   Psychomotor Activity  Psychomotor Activity: Psychomotor Activity: Normal   Assets  Assets: Housing; Social Support; Communication Skills   Sleep  Sleep: Sleep: Fair    Physical Exam: Physical Exam HENT:     Nose: Nose normal.  Pulmonary:     Effort: Pulmonary effort is normal.  Musculoskeletal:        General: Normal range of motion.  Neurological:     Mental Status: She  is alert.  Psychiatric:        Attention and Perception: Attention normal.        Mood and Affect: Mood is anxious. Affect is flat.        Speech: Speech normal.        Behavior: Behavior is cooperative.        Thought Content: Thought content includes suicidal ideation.        Cognition and Memory: Cognition is  impaired.        Judgment: Judgment is impulsive.    Review of Systems  Constitutional: Negative.   HENT: Negative.    Respiratory: Negative.    Gastrointestinal: Negative.   Psychiatric/Behavioral:  Positive for depression and suicidal ideas. The patient is nervous/anxious.    Blood pressure 111/70, pulse 74, temperature 98.6 F (37 C), temperature source Oral, resp. rate 18, height 5\' 3"  (1.6 m), last menstrual period 07/21/2022, SpO2 98 %, not currently breastfeeding. Body mass index is 38.26 kg/m.   Medical Decision Making: Medical Decision Making: Patient continues to require inpatient Psychiatric hospitalization. Pt accepted to [facility], room 305-01 at 230 pm 07/30/22.  Problem 1: Anxiety: Atarax 50 mg PO ordered   Disposition: Disposition: Recommend psychiatric Inpatient admission when medically cleared.    Obie Silos MOTLEY-MANGRUM, PMHNP 07/30/2022, 10:58 AM

## 2022-07-30 NOTE — Tx Team (Signed)
Initial Treatment Plan 07/30/2022 5:35 PM Sharon Ward [id number]    PATIENT STRESSORS: Marital or family conflict     PATIENT STRENGTHS: Communication skills  Physical Health    PATIENT IDENTIFIED PROBLEMS: Suicide attempt  Child custody issues                   DISCHARGE CRITERIA:  Improved stabilization in mood, thinking, and/or behavior  PRELIMINARY DISCHARGE PLAN: Outpatient therapy  PATIENT/FAMILY INVOLVEMENT: This treatment plan has been presented to and reviewed with the patient, Sharon Ward.  The patient has been given the opportunity to ask questions and make suggestions.  Garnette Scheuermann, RN 07/30/2022, 5:35 PM

## 2022-07-30 NOTE — Plan of Care (Signed)
Admission note to follow.

## 2022-07-30 NOTE — Tx Team (Signed)
Initial Treatment Plan 07/30/2022 4:04 PM Sharon Ward [id number]    PATIENT STRESSORS: Loss of custody of one daughter   Marital or family conflict     PATIENT STRENGTHS: Ability for insight  Capable of independent living  Communication skills    PATIENT IDENTIFIED PROBLEMS: Patient attempted to OD on multiple medications after life stressors r/t her 24 year old daughter possibly not coming back to live with her.                      DISCHARGE CRITERIA:  Improved stabilization in mood, thinking, and/or behavior Safe-care adequate arrangements made Verbal commitment to aftercare and medication compliance  PRELIMINARY DISCHARGE PLAN: Outpatient therapy Return to previous living arrangement Return to previous work or school arrangements  PATIENT/FAMILY INVOLVEMENT: This treatment plan has been presented to and reviewed with the patient, Sharon Ward..  The patient and family have been given the opportunity to ask questions and make suggestions.  Cherre Blanc, RN 07/30/2022, 4:04 PM

## 2022-07-31 DIAGNOSIS — F3181 Bipolar II disorder: Secondary | ICD-10-CM | POA: Diagnosis not present

## 2022-07-31 MED ORDER — ARIPIPRAZOLE 5 MG PO TABS
5.0000 mg | ORAL_TABLET | Freq: Every day | ORAL | Status: DC
  Administered 2022-07-31: 5 mg via ORAL
  Filled 2022-07-31 (×3): qty 1

## 2022-07-31 MED ORDER — HYDROXYZINE HCL 25 MG PO TABS: 25.0000 mg | ORAL_TABLET | Freq: Three times a day (TID) | ORAL | Status: DC | PRN

## 2022-07-31 MED ORDER — ALBUTEROL SULFATE HFA 108 (90 BASE) MCG/ACT IN AERS
1.0000 | INHALATION_SPRAY | RESPIRATORY_TRACT | Status: DC | PRN
  Administered 2022-08-02: 1 via RESPIRATORY_TRACT
  Filled 2022-07-31: qty 6.7

## 2022-07-31 NOTE — Progress Notes (Signed)
Adult Psychoeducational Group Note  Date:  07/31/2022 Time:  10:16 PM  Group Topic/Focus:  Wrap-Up Group:   The focus of this group is to help patients review their daily goal of treatment and discuss progress on daily workbooks.  Participation Level:  Active  Participation Quality:  Appropriate  Affect:  Appropriate  Cognitive:  Appropriate  Insight: Appropriate  Engagement in Group:  Developing/Improving  Modes of Intervention:  Discussion  Additional Comments:  Pt stated her goal for today was to focus on her treatment plan, aftercare placement set up, and support when she is discharge. Pt stated she accomplished her goals today. Pt stated she did speak with her treatment team today. Pt rated her overall day a 10. Pt stated she was able to contact her kids and her boyfriend today which improved her overall day. Pt stated she felt better about herself tonight. Pt stated she was able to attend all meals today. Pt stated she took all medications provided today. Pt stated her appetite was pretty good today. Pt rated her sleep last night was pretty good. Pt stated the goal tonight was to get some rest. Pt stated she had no physical pain tonight. Pt deny visual hallucinations and auditory issues tonight. Pt denies thoughts of harming herself or others. Pt stated she would alert staff if anything changed.  Sharon Ward 07/31/2022, 10:16 PM
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
## 2022-07-31 NOTE — [facility] Group Notes (Signed)
Adult Psychoeducational Group Note Date:  07/31/2022 Time:  0900-1000 Group Topic/Focus: PROGRESSIVE RELAXATION. A group where deep breathing is taught and tensing and relaxation muscle groups is used. Imagery is used as well.  Pts are asked to imagine 3 pillars that hold them up when they are not able to hold themselves up and to share that with the group.   Participation Level:  Active  Participation Quality:  Appropriate  Affect:  Appropriate  Cognitive:  Approprate  Insight: Improving  Engagement in Group:  Engaged  Modes of Intervention:  deep breathing, Imagery. Discussion  Additional Comments:  rates her energy at a 4/10. Pt walked out of the room and did not return.   : Dione Housekeeper

## 2022-07-31 NOTE — Progress Notes (Signed)
D-Patient alert and oriented. . Denies SI, HI, AVH, and pain. Patient denies anxiety and depression stating that she feels better.   A- Support and encouragement provided.  Routine safety checks conducted every 15 minutes.  Patient informed to notify staff with problems or concerns.  R- Patient contracts for safety at this time. Patient compliant with medications and treatment plan. Patient receptive, calm, and cooperative. Patient interacts well with others on the unit.  Patient remains safe at this time.

## 2022-07-31 NOTE — Plan of Care (Signed)
  Problem: Education: Goal: Emotional status will improve Outcome: Progressing Goal: Mental status will improve Outcome: Progressing   Problem: Activity: Goal: Sleeping patterns will improve Outcome: Progressing   Problem: Coping: Goal: Ability to demonstrate self-control will improve Outcome: Progressing   Problem: Health Behavior/Discharge Planning: Goal: Compliance with treatment plan for underlying cause of condition will improve Outcome: Progressing   

## 2022-07-31 NOTE — [facility] Suicide Risk Assessment (Signed)
[facility] Admission Suicide Risk Assessment   Nursing information obtained from:  Patient Demographic factors:  Adolescent or young adult, Caucasian, Low socioeconomic status, Living alone, Unemployed Current Mental Status:  NA Loss Factors:  Loss of significant relationship (24 year old remains in permanent custody per pt) Historical Factors:  Impulsivity, Prior suicide attempts Risk Reduction Factors:  Sense of responsibility to family, Responsible for children under 59 years of age  Total Time spent with patient: 1 hour Principal Problem: Bipolar 2 disorder, major depressive episode (HCC) Diagnosis:  Principal Problem:   Bipolar 2 disorder, major depressive episode (HCC) Active Problems:   PTSD (post-traumatic stress disorder)  Subjective Data: As per H&P  Continued Clinical Symptoms:  Alcohol Use Disorder Identification Test Final Score (AUDIT): 0 The "Alcohol Use Disorders Identification Test", Guidelines for Use in Primary Care, Second Edition.  World Science writer [facility]). Score between 0-7:  no or low risk or alcohol related problems. Score between 8-15:  moderate risk of alcohol related problems. Score between 16-19:  high risk of alcohol related problems. Score 20 or above:  warrants further diagnostic evaluation for alcohol dependence and treatment.   CLINICAL FACTORS:   Bipolar Disorder:   Depressive phase   Musculoskeletal: Strength & Muscle Tone: within normal limits Gait & Station: normal Patient leans: N/A  Psychiatric Specialty Exam:  Presentation  General Appearance:  Appropriate for Environment  Eye Contact: Good  Speech: Clear and Coherent  Speech Volume: Normal  Handedness: Right   Mood and Affect  Mood: Anxious; Hopeless  Affect: Appropriate; Flat   Thought Process  Thought Processes: Coherent  Descriptions of Associations:Intact  Orientation:Full (Time, Place and Person)  Thought Content:WDL  History of  Schizophrenia/Schizoaffective disorder:No  Duration of Psychotic Symptoms:N/A  Hallucinations:Hallucinations: None  Ideas of Reference:None  Suicidal Thoughts:Suicidal Thoughts: Yes, Passive SI Active Intent and/or Plan: Without Intent; Without Plan  Homicidal Thoughts:Homicidal Thoughts: No   Sensorium  Memory: Immediate Good; Remote Good  Judgment: Fair  Insight: Fair   Art therapist  Concentration: Good  Attention Span: Good  Recall: Fair  Fund of Knowledge: Fair  Language: Good   Psychomotor Activity  Psychomotor Activity: Psychomotor Activity: Normal   Assets  Assets: Housing; Social Support; Communication Skills   Sleep  Sleep: Sleep: Fair    Physical Exam: Physical Exam ROS Blood pressure 108/62, pulse 80, temperature 98.2 F (36.8 C), temperature source Oral, resp. rate 16, height 5\' 3"  (1.6 m), weight 97.5 kg, last menstrual period 07/21/2022, SpO2 100 %, not currently breastfeeding. Body mass index is 38.09 kg/m.   COGNITIVE FEATURES THAT CONTRIBUTE TO RISK:  None    SUICIDE RISK:   Severe:  Frequent, intense, and enduring suicidal ideation, specific plan, no subjective intent, but some objective markers of intent (i.e., choice of lethal method), the method is accessible, some limited preparatory behavior, evidence of impaired self-control, severe dysphoria/symptomatology, multiple risk factors present, and few if any protective factors, particularly a lack of social support.  PLAN OF CARE: As per H&P  I certify that inpatient services furnished can reasonably be expected to improve the patient's condition.   Sharon Ward 07/23/2022, MD 07/31/2022, 12:12 PM

## 2022-07-31 NOTE — Plan of Care (Signed)
  Problem: Education: Goal: Knowledge of [facility] Education information/materials will improve Outcome: Progressing Goal: Emotional status will improve Outcome: Progressing Goal: Mental status will improve Outcome: Progressing Goal: Verbalization of understanding the information provided will improve Outcome: Progressing   Problem: Activity: Goal: Interest or engagement in activities will improve Outcome: Progressing Goal: Sleeping patterns will improve Outcome: Progressing   Problem: Coping: Goal: Ability to verbalize frustrations and anger appropriately will improve Outcome: Progressing Goal: Ability to demonstrate self-control will improve Outcome: Progressing   Problem: Health Behavior/Discharge Planning: Goal: Identification of resources available to assist in meeting health care needs will improve Outcome: Progressing Goal: Compliance with treatment plan for underlying cause of condition will improve Outcome: Progressing   Problem: Physical Regulation: Goal: Ability to maintain clinical measurements within normal limits will improve Outcome: Progressing   Problem: Safety: Goal: Periods of time without injury will increase Outcome: Progressing   Problem: Education: Goal: Ability to make informed decisions regarding treatment will improve Outcome: Progressing   Problem: Coping: Goal: Coping ability will improve Outcome: Progressing   Problem: Health Behavior/Discharge Planning: Goal: Identification of resources available to assist in meeting health care needs will improve Outcome: Progressing   Problem: Medication: Goal: Compliance with prescribed medication regimen will improve Outcome: Progressing   Problem: Self-Concept: Goal: Ability to disclose and discuss suicidal ideas will improve Outcome: Progressing Goal: Will verbalize positive feelings about self Outcome: Progressing   

## 2022-07-31 NOTE — Group Note (Signed)
LCSW Group Therapy Note  07/31/2022      Type of Therapy and Topic:  Group Therapy: Gratitude   Description:   Group could not be held by social worker, but licensed RN did provide group.  A handout was given to each patient, with the following information:   Gratitude  "Acknowledging the good that you already have in your life is the foundation for all abundance." - Eckhart Tolle  " 'Enough' is a feast." - Buddhist Proverb  "Gratitude sweetens even the smallest moments."  "It is not joy that makes us grateful; It is gratitude that makes us joyful." - David Steindl-Rast    Put at least one response under each category of something for which you are grateful:  People:  Experiences:  Things:  Places:  Skills:  Other:  Add more responses as you get ideas from other people.   Therapeutic Modalities:   Activity  Catalino Plascencia J Grossman-Orr, LCSW   

## 2022-07-31 NOTE — Progress Notes (Signed)
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
## 2022-07-31 NOTE — Progress Notes (Signed)
Patient has been calm and cooperative this shift. She reports she slept well last night, has a good appetite, good energy level, concentration is good, rates depression 5/10, hopelessness 0/10, and anxiety 0/10. Denies any pain but reports being lightheaded, dizzy and blurred vision. MD aware. Patient stated she felt like her blood pressure was low earlier, gatorade and a water pitcher was provided. Patient has been steady on her feet and does not appear to be in any distress. Contracts for safety on unit. Denies SI/HI/AVH today. Verbal support given.

## 2022-08-01 DIAGNOSIS — F3181 Bipolar II disorder: Secondary | ICD-10-CM | POA: Diagnosis not present

## 2022-08-01 LAB — SARS CORONAVIRUS 2 BY RT PCR: SARS Coronavirus 2 by RT PCR: NEGATIVE
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
## 2022-08-01 MED ORDER — NICOTINE 14 MG/24HR TD PT24
14.0000 mg | MEDICATED_PATCH | Freq: Every day | TRANSDERMAL | Status: DC
  Administered 2022-08-01 – 2022-08-03 (×2): 14 mg via TRANSDERMAL
  Filled 2022-08-01 (×6): qty 1

## 2022-08-01 MED ORDER — LORAZEPAM 0.5 MG PO TABS
0.5000 mg | ORAL_TABLET | Freq: Once | ORAL | Status: AC
  Administered 2022-08-01: 0.5 mg via ORAL
  Filled 2022-08-01: qty 1

## 2022-08-01 MED ORDER — ARIPIPRAZOLE 5 MG PO TABS
5.0000 mg | ORAL_TABLET | Freq: Every day | ORAL | Status: DC
  Filled 2022-08-01: qty 1

## 2022-08-01 NOTE — Group Note (Signed)
Date:  08/01/2022 Time:  5:27 PM  Group Topic/Focus:  Dimensions of Wellness:   The focus of this group is to introduce the topic of wellness and discuss the role each dimension of wellness plays in total health.    Participation Level:  Active  Participation Quality:  Appropriate  Affect:  Appropriate  Cognitive:  Appropriate  Insight: Appropriate  Engagement in Group:  Engaged  Modes of Intervention:  Exploration  Additional Comments:     Reymundo Poll 08/01/2022, 5:27 PM

## 2022-08-01 NOTE — Group Note (Signed)
Date:  08/01/2022 Time:  2:00 PM  Group Topic/Focus:  Goals Group:   The focus of this group is to help patients establish daily goals to achieve during treatment and discuss how the patient can incorporate goal setting into their daily lives to aide in recovery.    Participation Level:  Active  Participation Quality:  Appropriate  Affect:  Appropriate  Cognitive:  Appropriate  Insight: Appropriate  Engagement in Group:  Engaged  Modes of Intervention:  Discussion  Additional Comments:  Pt wants to work on discharge planning  Jaquita Rector 08/01/2022, 2:00 PM

## 2022-08-01 NOTE — Progress Notes (Signed)
Lab did not come for bloodwork during day 12/24.

## 2022-08-01 NOTE — Progress Notes (Signed)
   08/01/22 0517  15 Minute Checks  Location Bedroom  Visual Appearance Calm  Behavior Sleeping  Sleep (Behavioral Health Patients Only)  Calculate sleep? (Click Yes once per 24 hr at 0600 safety check) Yes  Documented sleep last 24 hours 7

## 2022-08-01 NOTE — Group Note (Unsigned)
Date:  08/01/2022 Time:  2:09 PM  Group Topic/Focus:  Goals Group:   The focus of this group is to help patients establish daily goals to achieve during treatment and discuss how the patient can incorporate goal setting into their daily lives to aide in recovery. Orientation:   The focus of this group is to educate the patient on the purpose and policies of crisis stabilization and provide a format to answer questions about their admission.  The group details unit policies and expectations of patients while admitted.     Participation Level:  {BHH PARTICIPATION LEVEL:22264}  Participation Quality:  {BHH PARTICIPATION QUALITY:22265}  Affect:  {BHH AFFECT:22266}  Cognitive:  {BHH COGNITIVE:22267}  Insight: {BHH Insight2:20797}  Engagement in Group:  {BHH ENGAGEMENT IN GROUP:22268}  Modes of Intervention:  {BHH MODES OF INTERVENTION:22269}  Additional Comments:  ***  Ronnesha Mester Lashawn Naphtali Zywicki 08/01/2022, 2:09 PM  

## 2022-08-01 NOTE — Plan of Care (Signed)
  Problem: Education: Goal: Knowledge of [facility] Education information/materials will improve Outcome: Progressing Goal: Emotional status will improve Outcome: Progressing Note: Reports minimal stress/anxiety Goal: Mental status will improve Outcome: Progressing Note: Denies SI/HI/AVH Goal: Verbalization of understanding the information provided will improve Outcome: Progressing   Problem: Activity: Goal: Interest or engagement in activities will improve Outcome: Progressing Note: Patient is attending groups and engaging with peers Goal: Sleeping patterns will improve Outcome: Progressing

## 2022-08-01 NOTE — Progress Notes (Signed)
Adult Psychoeducational Group Note  Date:  08/01/2022 Time:  10:35 PM  Group Topic/Focus:  Wrap-Up Group:   The focus of this group is to help patients review their daily goal of treatment and discuss progress on daily workbooks.  Participation Level:  Active  Participation Quality:  Appropriate  Affect:  Appropriate  Cognitive:  Appropriate  Insight: Appropriate  Engagement in Group:  Engaged  Modes of Intervention:  Discussion, Rapport Building, and Support  Additional Comments:   Pt attended and participated in the [place] group. Pt denied SI/HI/AVH. Pt rated her day 10/10. Pt met her goal of talking with the provider about discharge. Pt identified writing, reading, talking to RN as effective coping skills.   Wetzel Bjornstad Syrus Nakama 08/01/2022, 10:35 PM
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
## 2022-08-01 NOTE — Progress Notes (Deleted)
Adult Psychoeducational Group Note  Date:  08/01/2022 Time:  9:27 PM  Group Topic/Focus:  Wrap-Up Group:   The focus of this group is to help patients review their daily goal of treatment and discuss progress on daily workbooks.  Participation Level:  Did Not Attend  Participation Quality:   n/a  Affect:   n/a  Cognitive:   n/a  Insight: None  Engagement in Group:   n/a  Modes of Intervention:   n/a  Additional Comments:   Pt did not attend the Wrap Up group.  Edmund Hilda Giovanny Dugal 08/01/2022, 9:27 PM

## 2022-08-01 NOTE — Group Note (Signed)
[facility] LCSW Group Therapy Note  Date/Time:  08/01/2022   Type of Therapy and Topic:  Group could not be held today.  A handout to read on the topic of developing a healthy support system was given to each patient.  They were encouraged to discuss this with their peers.  Handout:  A handout entitled "Developing Your Support System" was provided.  This handout discusses the benefits of a social support system, how to sustain current relationships, some ideas for building a social support system, and why it is important to cultivate your social support system now.  Therapeutic Modalities:   Handout  Aamori Mcmasters Grossman-Orr, LCSW        

## 2022-08-02 ENCOUNTER — Encounter (HOSPITAL_COMMUNITY): Payer: Self-pay

## 2022-08-02 DIAGNOSIS — F3181 Bipolar II disorder: Secondary | ICD-10-CM | POA: Diagnosis not present

## 2022-08-02 MED ORDER — ARIPIPRAZOLE 10 MG PO TABS
10.0000 mg | ORAL_TABLET | Freq: Every day | ORAL | Status: DC
  Administered 2022-08-02 – 2022-08-04 (×3): 10 mg via ORAL
  Filled 2022-08-02 (×5): qty 1

## 2022-08-02 NOTE — Progress Notes (Signed)
   08/02/22 0757  Vitals  BP 111/62  MAP (mmHg) 76  BP Location Left Arm  BP Method Automatic  Patient Position (if appropriate) Sitting  Pulse Rate 93  Pulse Rate Source Monitor  Resp 18  Level of Consciousness  Level of Consciousness Alert  Oxygen Therapy  SpO2 100 %  O2 Device Room Air   Patient vitals reassessed.

## 2022-08-02 NOTE — Progress Notes (Signed)
     08/02/22 2056  Psych Admission Type (Psych Patients Only)  Admission Status Involuntary  Psychosocial Assessment  Patient Complaints Anxiety;Depression;Sadness  Eye Contact Fair  Facial Expression Anxious  Affect Apathetic  Speech Logical/coherent  Interaction Assertive  Motor Activity Other (Comment) (WDL)  Appearance/Hygiene Unremarkable  Behavior Characteristics Anxious  Mood Depressed;Anxious  Thought Process  Coherency WDL  Content WDL  Delusions None reported or observed  Perception WDL  Hallucination None reported or observed  Judgment Poor  Confusion None  Danger to Self  Current suicidal ideation? Denies  Agreement Not to Harm Self Yes  Description of Agreement verbal  Danger to Others  Danger to Others None reported or observed

## 2022-08-02 NOTE — Plan of Care (Signed)
  Problem: Activity: Goal: Interest or engagement in activities will improve Outcome: Progressing Goal: Sleeping patterns will improve Outcome: Progressing   Problem: Coping: Goal: Ability to demonstrate self-control will improve Outcome: Progressing   Problem: Medication: Goal: Compliance with prescribed medication regimen will improve Outcome: Progressing

## 2022-08-02 NOTE — Progress Notes (Signed)
   08/02/22 0542  Sleep  Number of Hours 6.5

## 2022-08-02 NOTE — BH IP Treatment Plan (Signed)
Interdisciplinary Treatment and Diagnostic Plan Update  08/02/2022 Time of Session: 9:30AM  Sharon Ward MRN: [id number]  Principal Diagnosis: Bipolar 2 disorder, major depressive episode (HCC)  Secondary Diagnoses: Principal Problem:   Bipolar 2 disorder, major depressive episode (HCC) Active Problems:   PTSD (post-traumatic stress disorder)   Current Medications:  Current Facility-Administered Medications  Medication Dose Route Frequency Provider Last Rate Last Admin   acetaminophen (TYLENOL) tablet 650 mg  650 mg Oral Q6H PRN Sindy Guadeloupe, NP   650 mg at 08/01/22 2243   albuterol (VENTOLIN HFA) 108 (90 Base) MCG/ACT inhaler 1 puff  1 puff Inhalation Q4H PRN Abbott Pao, Nadir, MD   1 puff at 08/02/22 0703   alum & mag hydroxide-simeth (MAALOX/MYLANTA) 200-200-20 MG/5ML suspension 30 mL  30 mL Oral Q4H PRN Sindy Guadeloupe, NP       ARIPiprazole (ABILIFY) tablet 5 mg  5 mg Oral QHS Attiah, Nadir, MD       diphenhydrAMINE (BENADRYL) capsule 25 mg  25 mg Oral Q6H PRN Bobbitt, Shalon E, NP   25 mg at 07/31/22 8502   hydrOXYzine (ATARAX) tablet 25 mg  25 mg Oral TID PRN Sarita Bottom, MD       magnesium hydroxide (MILK OF MAGNESIA) suspension 30 mL  30 mL Oral Daily PRN Sindy Guadeloupe, NP       nicotine (NICODERM CQ - dosed in mg/24 hours) patch 14 mg  14 mg Transdermal Daily Attiah, Nadir, MD   14 mg at 08/01/22 1020   PTA Medications: Medications Prior to Admission  Medication Sig Dispense Refill Last Dose   ARIPiprazole (ABILIFY) 5 MG tablet Take 1 tablet (5 mg total) by mouth daily. 30 tablet 0    benzonatate (TESSALON) 100 MG capsule Take 1 capsule (100 mg total) by mouth every 8 (eight) hours. (Patient not taking: Reported on 07/29/2022) 21 capsule 0    docusate sodium (COLACE) 100 MG capsule Take 1 capsule (100 mg total) by mouth daily as needed. 30 capsule 2    FLUoxetine (PROZAC) 10 MG capsule Take 1 capsule (10 mg total) by mouth daily. 30 capsule 0    hydrOXYzine (ATARAX) 25 MG  tablet Take 1 tablet (25 mg total) by mouth 3 (three) times daily as needed for anxiety. 30 tablet 0    neomycin-bacitracin-polymyxin [phone] OINT Apply 1 Application topically 2 (two) times daily. (Patient not taking: Reported on 07/29/2022) 15 g 0    nicotine (NICODERM CQ - DOSED IN MG/24 HOURS) 14 mg/24hr patch Place 1 patch (14 mg total) onto the skin daily. 28 patch 0    nitrofurantoin, macrocrystal-monohydrate, (MACROBID) 100 MG capsule Take 1 capsule (100 mg total) by mouth every 12 (twelve) hours. (Patient not taking: Reported on 07/29/2022) 10 capsule 0    predniSONE (DELTASONE) 20 MG tablet Take 2 tablets (40 mg total) by mouth daily. (Patient not taking: Reported on 07/29/2022) 10 tablet 0    promethazine-dextromethorphan (PROMETHAZINE-DM) 6.25-15 MG/5ML syrup Take 5 mLs by mouth 4 (four) times daily as needed for cough. (Patient not taking: Reported on 07/29/2022) 118 mL 0    traZODone (DESYREL) 50 MG tablet Take 1 tablet (50 mg total) by mouth at bedtime as needed for sleep. 30 tablet 0     Patient Stressors: Marital or family conflict    Patient Strengths: Manufacturing systems engineer  Physical Health   Treatment Modalities: Medication Management, Group therapy, Case management,  1 to 1 session with clinician, Psychoeducation, Recreational therapy.   Physician Treatment Plan for Primary Diagnosis:  Bipolar 2 disorder, major depressive episode (HCC) Long Term Goal(s): Improvement in symptoms so as ready for discharge   Short Term Goals: Ability to identify changes in lifestyle to reduce recurrence of condition will improve Ability to verbalize feelings will improve Ability to disclose and discuss suicidal ideas Ability to demonstrate self-control will improve Ability to identify and develop effective coping behaviors will improve  Medication Management: Evaluate patient's response, side effects, and tolerance of medication regimen.  Therapeutic Interventions: 1 to 1 sessions,  Unit Group sessions and Medication administration.  Evaluation of Outcomes: Not Progressing  Physician Treatment Plan for Secondary Diagnosis: Principal Problem:   Bipolar 2 disorder, major depressive episode (HCC) Active Problems:   PTSD (post-traumatic stress disorder)  Long Term Goal(s): Improvement in symptoms so as ready for discharge   Short Term Goals: Ability to identify changes in lifestyle to reduce recurrence of condition will improve Ability to verbalize feelings will improve Ability to disclose and discuss suicidal ideas Ability to demonstrate self-control will improve Ability to identify and develop effective coping behaviors will improve     Medication Management: Evaluate patient's response, side effects, and tolerance of medication regimen.  Therapeutic Interventions: 1 to 1 sessions, Unit Group sessions and Medication administration.  Evaluation of Outcomes: Not Progressing   RN Treatment Plan for Primary Diagnosis: Bipolar 2 disorder, major depressive episode (HCC) Long Term Goal(s): Knowledge of disease and therapeutic regimen to maintain health will improve  Short Term Goals: Ability to remain free from injury will improve, Ability to verbalize frustration and anger appropriately will improve, Ability to demonstrate self-control, Ability to participate in decision making will improve, Ability to verbalize feelings will improve, Ability to disclose and discuss suicidal ideas, Ability to identify and develop effective coping behaviors will improve, and Compliance with prescribed medications will improve  Medication Management: RN will administer medications as ordered by provider, will assess and evaluate patient's response and provide education to patient for prescribed medication. RN will report any adverse and/or side effects to prescribing provider.  Therapeutic Interventions: 1 on 1 counseling sessions, Psychoeducation, Medication administration, Evaluate  responses to treatment, Monitor vital signs and CBGs as ordered, Perform/monitor CIWA, COWS, AIMS and Fall Risk screenings as ordered, Perform wound care treatments as ordered.  Evaluation of Outcomes: Not Progressing   LCSW Treatment Plan for Primary Diagnosis: Bipolar 2 disorder, major depressive episode (HCC) Long Term Goal(s): Safe transition to appropriate next level of care at discharge, Engage patient in therapeutic group addressing interpersonal concerns.  Short Term Goals: Engage patient in aftercare planning with referrals and resources, Increase social support, Increase ability to appropriately verbalize feelings, Increase emotional regulation, Facilitate acceptance of mental health diagnosis and concerns, Facilitate patient progression through stages of change regarding substance use diagnoses and concerns, Identify triggers associated with mental health/substance abuse issues, and Increase skills for wellness and recovery  Therapeutic Interventions: Assess for all discharge needs, 1 to 1 time with Social worker, Explore available resources and support systems, Assess for adequacy in community support network, Educate family and significant other(s) on suicide prevention, Complete Psychosocial Assessment, Interpersonal group therapy.  Evaluation of Outcomes: Not Progressing   Progress in Treatment: Attending groups: Yes. Participating in groups: Yes. Taking medication as prescribed: Yes. Toleration medication: Yes. Family/Significant other contact made: No, will contact:  children's father Lawanda Cousins [phone] Patient understands diagnosis: Yes. Discussing patient identified problems/goals with staff: Yes. Medical problems stabilized or resolved: Yes. Denies suicidal/homicidal ideation: Yes. Issues/concerns per patient self-inventory: No.   New problem(s) identified:  No, Describe:  None reported   New Short Term/Long Term Goal(s): medication stabilization, elimination  of SI thoughts, development of comprehensive mental wellness plan.    Patient Goals:  "Focus on myself , learn new coping skills, I have learned two so far, which are using a stress ball and writing "  Discharge Plan or Barriers: Patient recently admitted. CSW will continue to follow and assess for appropriate referrals and possible discharge planning.    Reason for Continuation of Hospitalization: Anxiety Depression Medication stabilization Suicidal ideation  Estimated Length of Stay:3-5 days   Last 3 [name] Suicide Severity Risk Score: Flowsheet Row [facility] (Current[facility]21/2022 in [facility] Admission (Discharged) from OP [facility]  C-SSRS RISK CATEGORY No Risk No Risk High Risk       Last PHQ 2/9 Scores:    05/26/2022    3:44 PM 05/19/2022    4:30 PM 05/13/2022    2:04 PM  Depression screen PHQ 2/9  Decreased Interest 0 0 0  Down, Depressed, Hopeless 0 1 0  PHQ - 2 Score 0 1 0  Altered sleeping 1 1 1   Tired, decreased energy 1 1 1   Change in appetite 1 1 1   Feeling bad or failure about yourself  0 0 0  Trouble concentrating 0 0 0  Moving slowly or fidgety/restless 0 0 0  Suicidal thoughts 0 0 0  PHQ-9 Score 3 4 3     Scribe for Treatment Team: , LCSWA 08/02/2022 11:05 AM

## 2022-08-02 NOTE — Progress Notes (Addendum)
   08/01/22 2058  Psych Admission Type (Psych Patients Only)  Admission Status Involuntary  Psychosocial Assessment  Patient Complaints Agitation;Irritability;Crying spells;Depression;Anxiety  Eye Contact Fair  Facial Expression Angry  Affect Irritable;Labile  Speech Argumentative;Aggressive;Loud;Pressured  Interaction Hostile  Motor Activity Restless;Fidgety  Appearance/Hygiene Unremarkable  Behavior Characteristics Agitated;Irritable  Mood Irritable;Labile  Thought Chartered certified accountant of ideas;Concrete thinking  Content Blaming others  Delusions None reported or observed  Perception WDL  Hallucination None reported or observed  Judgment Poor  Confusion None  Danger to Self  Current suicidal ideation? Denies  Self-Injurious Behavior No self-injurious ideation or behavior indicators observed or expressed   Agreement Not to Harm Self Yes  Description of Agreement Verbally contracts for safety.  Danger to Others  Danger to Others None reported or observed   Patient alert and oriented. Presenting with a labile, irritable affect and mood. Patient denies SI, HI, and AVH. Patient endorses anxiety and depression 8/10. Patient endorses abdominal/pelvic pain described as "sharp" and worsening with movement. PRN tylenol administered, per provider orders. Patient requested abilify at bed, order scheduled to start 12/25 night. RN notified NP of patient's request. Provider notes reviewed to start 12/25 due to QTc prolongation results. Patient was educated on reason for starting abilify order 12/25 night related to QTc. Patient became visibly irritable and agitated and began shouting at staff stating, "Ilsa Iha are f'ing up my medications, and some doctor who doesn't even understand my situation is telling me I can't have it after I talked to the doctor today." Patient became frustrated, loud and went to room. RN attempted to de-escalate patient and patient stated "I don't want to talk you about  this, you're messing up my medications." Patient spoke to ex-boyfriend on the phone, became agitated, and began shouting. RN notified Roselyn Bering, NP to provide additional education on QTc and EKG results. Provider came to unit to speak to patient and was able to be redirected after further education. Patient reported blurred vision, dizziness and shortness of breath to RN. Patient was assessed by RN and reported to [facility], NP. Patient denies chest pain and additional cardiac symptoms at this time. Will continue to closely monitor. Patient appears to be in no acute distress, respirations are equal and unlabored. Lorazepam, ordered once, administered to patient per provider orders. Support and encouragement provided.  Routine safety checks conducted every 15 minutes. Patient verbally contracts for safety and remains safe on the unit.
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
## 2022-08-02 NOTE — Progress Notes (Signed)
D:  Patient denied SI and HI, contracts for safety.  Denied A/V hallucinations.   A:  Medications administered per MD orders.  Emotional support and encouragement given patient. R:  Safety maintained with 15 minute checks.  

## 2022-08-02 NOTE — Plan of Care (Signed)
Nurse discussed anxiety, depression and coping skills with patient.  

## 2022-08-02 NOTE — Progress Notes (Addendum)
Patient reporting shortness of breath and chest pain on inhalation. Vitals within limits, with elevated pulse rate of 108. Patient appears to be in no acute distress. Respirations even and unlabored. Will continue to monitor and report to oncoming RN. Roselyn Bering, NP notified. PRN albuterol administered for shortness of breath.

## 2022-08-02 NOTE — BHH Group Notes (Signed)
Patient attended the AA group. 

## 2022-08-02 NOTE — Group Note (Signed)
Recreation Therapy Group Note   Group Topic:Problem Solving  Group Date: 08/02/2022 Start Time: 0930 End Time: 1000 Facilitators: Lova Urbieta-McCall, LRT,CTRS Location: [address] Dayroom   Goal Area(s) Addresses:  Patient will effectively work in a team with other group members. Patient will verbalize importance of using appropriate problem solving techniques.  Patient will identify positive change associated with effective problem solving skills.    Group Description: Christmas Trivia.  Patients were divided into two teams.  LRT conducted three rounds of trivia with the patients.  The value of each question, was written beside each line were the answer was to be written.  After each round, patients will count of their points and write them at the bottom of the sheet.  At the end of the game, patients will add the totals of all three rounds together.  The team with the most points, wins the game.       Affect/Mood: Appropriate   Participation Level: Engaged   Participation Quality: Independent   Behavior: Appropriate   Speech/Thought Process: Focused   Insight: Good   Judgement: Good   Modes of Intervention: Competitive Play   Patient Response to Interventions:  Engaged   Education Outcome:  Acknowledges education and In group clarification offered    Clinical Observations/Individualized Feedback: Pt was appropriate and worked well with peers in figuring out the answers to the questions.  Pt was bright and engaged throughout activity.     Plan: Continue to engage patient in RT group sessions 2-3x/week.   Christoher Drudge-McCall, LRT,CTRS 08/02/2022 11:33 AM

## 2022-08-02 NOTE — BHH Group Notes (Signed)
Patient attended goals group. She shared that her goal is "developing my discharge plan, what to work on, and what new coping skills I will use when I leave."

## 2022-08-03 DIAGNOSIS — F3181 Bipolar II disorder: Secondary | ICD-10-CM | POA: Diagnosis not present

## 2022-08-03 LAB — COMPREHENSIVE METABOLIC PANEL
ALT: 33 U/L (ref 0–44)
AST: 24 U/L (ref 15–41)
Albumin: 4.5 g/dL (ref 3.5–5.0)
Alkaline Phosphatase: 63 U/L (ref 38–126)
Anion gap: 6 (ref 5–15)
BUN: 12 mg/dL (ref 6–20)
CO2: 27 mmol/L (ref 22–32)
Calcium: 9.8 mg/dL (ref 8.9–10.3)
Chloride: 108 mmol/L (ref 98–111)
Creatinine, Ser: 0.91 mg/dL (ref 0.44–1.00)
GFR, Estimated: >60 mL/min (ref >60)
Glucose, Bld: 115 mg/dL — ABNORMAL HIGH (ref 70–99)
Potassium: 4.1 mmol/L (ref 3.5–5.1)
Sodium: 141 mmol/L (ref 135–145)
Total Bilirubin: 0.4 mg/dL (ref 0.3–1.2)
Total Protein: 7.2 g/dL (ref 6.5–8.1)

## 2022-08-03 NOTE — Group Note (Signed)
LCSW Group Therapy Note  Group Date: 08/03/2022 Start Time: 1100 End Time: 1200   Type of Therapy and Topic:  Group Therapy - How To Cope with Nervousness about Discharge   Participation Level:  Active   Description of Group This process group involved identification of patients' feelings about discharge. Some of them are scheduled to be discharged soon, while others are new admissions, but each of them was asked to share thoughts and feelings surrounding discharge from the hospital. One common theme was that they are excited at the prospect of going home, while another was that many of them are apprehensive about sharing why they were hospitalized. Patients were given the opportunity to discuss these feelings with their peers in preparation for discharge.  Therapeutic Goals  Patient will identify their overall feelings about pending discharge. Patient will think about how they might proactively address issues that they believe will once again arise once they get home (i.e. with parents). Patients will participate in discussion about having hope for change.   Summary of Patient Progress:  Meyli was very active throughout the session. [place] demonstrated good insight into the subject matter, and proved open to input from peers and feedback from CSW. Elverta was respectful of peers and participated throughout the entire session.   Therapeutic Modalities Cognitive Behavioral Therapy   Ane Payment, LCSW 08/03/2022  1:34 PM

## 2022-08-03 NOTE — BHH Group Notes (Signed)
Adult Psychoeducational Group Note  Date:  08/03/2022 Time:  3:06 PM  Group Topic/Focus:  Healthy Communication:   The focus of this group is to discuss communication, barriers to communication, as well as healthy ways to communicate with others.  Participation Level:  Active  Participation Quality:  Appropriate  Affect:  Appropriate  Cognitive:  Appropriate  Insight: Appropriate  Engagement in Group:  Engaged  Modes of Intervention:  Education  Additional Comments:  PT participated in group "Be impeccable with your words"  Gwinda Maine 08/03/2022, 3:06 PM
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
## 2022-08-03 NOTE — Group Note (Signed)
Date:  08/03/2022 Time:  8:45 AM  Group Topic/Focus:  Goals Group:   The focus of this group is to help patients establish daily goals to achieve during treatment and discuss how the patient can incorporate goal setting into their daily lives to aide in recovery. Orientation:   The focus of this group is to educate the patient on the purpose and policies of crisis stabilization and provide a format to answer questions about their admission.  The group details unit policies and expectations of patients while admitted.    Participation Level:  Active  Participation Quality:  Appropriate  Affect:  Appropriate  Cognitive:  Appropriate  Insight: Appropriate  Engagement in Group:  Engaged  Modes of Intervention:  Education  Additional Comments:  Goal for today is to be discharged.  Gwinda Maine 08/03/2022, 8:45 AM

## 2022-08-03 NOTE — Progress Notes (Addendum)
   On assessment, patient is alert and oriented.  Patient presents with moderate anxiety and depression.  Patient denies pain.  Patient was offered her albuterol inhaler and reports she is no longer having the symptoms from earlier (SOB, Chest pain, blurred vision).  Patient is observed in the milieu interacting with peers and attending group.  Patient denies SI/HI/AVH.  Patient contracts for safety.    Administered scheduled medications.  Patient was provided emotional support.  Patient is safe on the unit with Q 15 minute safety checks.

## 2022-08-03 NOTE — Group Note (Signed)
Recreation Therapy Group Note   Group Topic:Animal Assisted Therapy   Group Date: 08/03/2022 Start Time: 1430 End Time: 1505 Facilitators: Kashtyn Jankowski-McCall, LRT,CTRS Location: [address] Dayroom   Animal-Assisted Activity (AAA) Program Checklist/Progress Notes Patient Eligibility Criteria Checklist & Daily Group note for Rec Tx Intervention  AAA/T Program Assumption of Risk Form signed by Patient/ or Parent Legal Guardian Yes  Patient is free of allergies or severe asthma Yes  Patient reports no fear of animals Yes  Patient reports no history of cruelty to animals Yes  Patient understands his/her participation is voluntary Yes  Patient washes hands before animal contact Yes  Patient washes hands after animal contact Yes   Affect/Mood: Appropriate   Participation Level: Engaged   Participation Quality: Independent   Behavior: Appropriate    Clinical Observations/Individualized Feedback: Patient attended session and interacted appropriately with therapy dog and peers. Patient asked appropriate questions about therapy dog and his training. Patient shared stories about their pets at home with group.    Plan: Continue to engage patient in RT group sessions 2-3x/week.   Sharon Ward, LRT,CTRS 08/03/2022 3:17 PM

## 2022-08-03 NOTE — Progress Notes (Signed)
   08/03/22 0800  Psych Admission Type (Psych Patients Only)  Admission Status Involuntary  Psychosocial Assessment  Patient Complaints Anxiety  Eye Contact Fair  Facial Expression Anxious  Affect Anxious;Appropriate to circumstance  Speech Logical/coherent  Interaction Assertive  Motor Activity Other (Comment) (Unremarkable.)  Appearance/Hygiene Unremarkable  Behavior Characteristics Cooperative;Anxious  Mood Anxious;Depressed  Thought Process  Coherency WDL  Content WDL  Delusions None reported or observed  Perception WDL  Hallucination None reported or observed  Judgment Impaired  Confusion None  Danger to Self  Current suicidal ideation? Denies  Agreement Not to Harm Self Yes  Description of Agreement Verbal.  Danger to Others  Danger to Others None reported or observed

## 2022-08-03 NOTE — [facility] Suicide Risk Assessment (Signed)
[facility] INPATIENT:  Family/Significant Other Suicide Prevention Education  Suicide Prevention Education:  Education Completed; 06-03-2022,  children's father Sharon Ward [phone] has been identified by the patient with whom the patient will be residing, and identified as the person(s) who will aid the patient in the event of a mental health crisis (suicidal ideations/suicide attempt).  With written consent from the patient, the family member/significant other has been provided the following suicide prevention education, prior to the and/or following the discharge of the patient.  The suicide prevention education provided includes the following: Suicide risk factors Suicide prevention and interventions National Suicide Hotline telephone number [facility] assessment telephone number [facility] Emergency Assistance 911 [facility] and/or Residential Mobile Crisis Unit telephone number  Request made of family/significant other to: Remove weapons (e.g., guns, rifles, knives), all items previously/currently identified as safety concern.   Remove drugs/medications (over-the-counter, prescriptions, illicit drugs), all items previously/currently identified as a safety concern.  (children's father Sharon Ward [phone] verbalizes understanding of the suicide prevention education information provided.  The family member/significant other agrees to remove the items of safety concern listed above.  Sharon Ward 08/03/2022, 1:29 PM

## 2022-08-03 NOTE — Group Note (Signed)
Date:  08/03/2022 Time:  9:57 AM  Group Topic/Focus:  Goals Group:   The focus of this group is to help patients establish daily goals to achieve during treatment and discuss how the patient can incorporate goal setting into their daily lives to aide in recovery.    Participation Level:  Active  Participation Quality:  Appropriate  Affect:  Appropriate  Cognitive:  Appropriate  Insight: Appropriate  Engagement in Group:  Engaged  Modes of Intervention:  Education  Additional Comments:  PT"S goal today is to talk to Social Work.  Gwinda Maine 08/03/2022, 9:57 AM

## 2022-08-04 ENCOUNTER — Encounter (HOSPITAL_COMMUNITY): Payer: Self-pay

## 2022-08-04 DIAGNOSIS — F3181 Bipolar II disorder: Secondary | ICD-10-CM | POA: Diagnosis not present

## 2022-08-04 NOTE — [facility] Group Notes (Signed)
[facility] Group Notes:  (Nursing/MHT/Case Management/Adjunct)  Date:  08/04/2022  Time:  12:30 AM  Type of Therapy:   wrap up group  Participation Level:  Active  Participation Quality:  Appropriate  Affect:  Appropriate  Cognitive:  Alert and Appropriate  Insight:  Appropriate  Engagement in Group:  Engaged  Modes of Intervention:  Discussion  Summary of Progress/Problems:The pt. Shared with the group how she rates her day with a 7.  She was excited to continue and share how she will be discharging home tomorrow.   Annell Greening [place] 08/04/2022, 12:30 AM

## 2022-08-04 NOTE — Progress Notes (Signed)
   08/04/22 0545  15 Minute Checks  Location Bedroom  Visual Appearance Calm  Behavior Sleeping  Sleep (Behavioral Health Patients Only)  Calculate sleep? (Click Yes once per 24 hr at 0600 safety check) Yes  Documented sleep last 24 hours 7

## 2022-08-04 NOTE — Plan of Care (Signed)
  Problem: Education: Goal: Emotional status will improve Outcome: Progressing Goal: Mental status will improve Outcome: Progressing   Problem: Activity: Goal: Sleeping patterns will improve Outcome: Progressing   Problem: Coping: Goal: Ability to verbalize frustrations and anger appropriately will improve Outcome: Progressing Goal: Ability to demonstrate self-control will improve Outcome: Progressing   Problem: Health Behavior/Discharge Planning: Goal: Identification of resources available to assist in meeting health care needs will improve Outcome: Progressing

## 2022-08-04 NOTE — Group Note (Signed)
Recreation Therapy Group Note   Group Topic:Team Building  Group Date: 08/04/2022 Start Time: 0930 End Time: 0950 Facilitators: Maxine Fredman-McCall, LRT,CTRS Location: [address] Dayroom   Goal Area(s) Addresses:  Patient will effectively work with peer towards shared goal.  Patient will identify skills used to make activity successful.  Patient will share challenges and verbalize solution-driven approaches used. Patient will identify how skills used during activity can be used to reach post d/c goals.   Group Description: [employer]. Patients were provided the following materials: 5 drinking straws, 5 rubber bands, 5 paper clips, 2 index cards and 2 drinking cups. Using the provided materials patients were asked to build a launching mechanism to launch a ping pong ball across the room, approximately 10 feet. Patients were divided into teams of 3-5. Instructions required all materials be incorporated into the device, functionality of items left to the peer group's discretion.   Affect/Mood: Appropriate   Participation Level: Engaged   Participation Quality: Independent   Behavior: Appropriate   Speech/Thought Process: Focused   Insight: Good   Judgement: Good   Modes of Intervention: STEM Activity   Patient Response to Interventions:  Engaged   Education Outcome:  Acknowledges education and In group clarification offered    Clinical Observations/Individualized Feedback: Pt was engaged and worked well with peers in creating and completing the activity.     Plan: Continue to engage patient in RT group sessions 2-3x/week.   Codi Kertz-McCall, LRT,CTRS 08/04/2022 12:08 PM

## 2022-08-04 NOTE — Progress Notes (Signed)
D- Patient alert and oriented. Denies SI, HI, AVH, and pain. Patient denies anxiety and depression. Patients states, "It was a really good day" while smiling.   A- Scheduled medications administered to patient, per MAR. Support and encouragement provided.  Routine safety checks conducted every 15 minutes.  Patient informed to notify staff with problems or concerns.  R- No adverse drug reactions noted. Patient contracts for safety at this time. Patient compliant with medications and treatment plan. Patient receptive, calm, and cooperative. Patient interacts well with others on the unit.  Patient remains safe at this time.

## 2022-08-04 NOTE — Progress Notes (Addendum)
D: Pt denied SI/HI/AVH this morning. Pt denied anxiety and depression this morning. Pt has been pleasant, calm, and cooperative throughout the shift. Pt seen interacting well on the unit with staff and other patients throughout the day. Pt presents with a bright affect. Pt reports that her goal for today is "I want to continue to think positive and keep on being positive in life even when things get hard". Pt reports that she wants to be discharged tomorrow. Pt reports that she needs access to her phone tomorrow between 8am-10am to verify her appointment to see her daughter on Saturday.   A: RN provided support and encouragement to patient. Pt given scheduled medications as prescribed. Q15 min checks verified for safety.    R: Patient verbally contracts for safety. Patient compliant with medications and treatment plan. Patient is interacting well on the unit. Pt is safe on the unit.   08/04/22 1004  Psych Admission Type (Psych Patients Only)  Admission Status Voluntary  Psychosocial Assessment  Patient Complaints None  Eye Contact Fair  Facial Expression Animated  Affect Appropriate to circumstance  Speech Logical/coherent  Interaction Assertive  Motor Activity Other (Comment) (WDL)  Appearance/Hygiene Unremarkable  Behavior Characteristics Cooperative;Appropriate to situation  Mood Pleasant  Thought Process  Coherency WDL  Content WDL  Delusions None reported or observed  Perception WDL  Hallucination None reported or observed  Judgment Impaired  Confusion None  Danger to Self  Current suicidal ideation? Denies  Self-Injurious Behavior No self-injurious ideation or behavior indicators observed or expressed   Agreement Not to Harm Self Yes  Description of Agreement Pt verbally contracts for safety  Danger to Others  Danger to Others None reported or observed

## 2022-08-04 NOTE — Progress Notes (Signed)
Patient came to this RN and reported a rash on her inner thigh that she believe to be caused by chaffing. Assessment findings show a red, raised rash approximately 3 inches long and 0.5 inches wide. Provider notified. Patient educated on washing with soap and water and assuring that it is dry before dressing. Patient verbalized understanding of education.

## 2022-08-04 NOTE — BH IP Treatment Plan (Signed)
Interdisciplinary Treatment and Diagnostic Plan Update  08/04/2022 Time of Session: 9:55am  Sharon Ward MRN: [id number]  Principal Diagnosis: Bipolar 2 disorder, major depressive episode (HCC)  Secondary Diagnoses: Principal Problem:   Bipolar 2 disorder, major depressive episode (HCC) Active Problems:   PTSD (post-traumatic stress disorder)   Current Medications:  Current Facility-Administered Medications  Medication Dose Route Frequency Provider Last Rate Last Admin   acetaminophen (TYLENOL) tablet 650 mg  650 mg Oral Q6H PRN Sindy Guadeloupe, NP   650 mg at 08/01/22 2243   albuterol (VENTOLIN HFA) 108 (90 Base) MCG/ACT inhaler 1 puff  1 puff Inhalation Q4H PRN Abbott Pao, Nadir, MD   1 puff at 08/02/22 0703   alum & mag hydroxide-simeth (MAALOX/MYLANTA) 200-200-20 MG/5ML suspension 30 mL  30 mL Oral Q4H PRN Sindy Guadeloupe, NP       ARIPiprazole (ABILIFY) tablet 10 mg  10 mg Oral QHS Abbott Pao, Nadir, MD   10 mg at 08/03/22 2045   diphenhydrAMINE (BENADRYL) capsule 25 mg  25 mg Oral Q6H PRN Bobbitt, Shalon E, NP   25 mg at 07/31/22 5027   hydrOXYzine (ATARAX) tablet 25 mg  25 mg Oral TID PRN Sarita Bottom, MD       magnesium hydroxide (MILK OF MAGNESIA) suspension 30 mL  30 mL Oral Daily PRN Sindy Guadeloupe, NP       nicotine (NICODERM CQ - dosed in mg/24 hours) patch 14 mg  14 mg Transdermal Daily Attiah, Nadir, MD   14 mg at 08/03/22 0736   PTA Medications: Medications Prior to Admission  Medication Sig Dispense Refill Last Dose   ARIPiprazole (ABILIFY) 5 MG tablet Take 1 tablet (5 mg total) by mouth daily. 30 tablet 0    benzonatate (TESSALON) 100 MG capsule Take 1 capsule (100 mg total) by mouth every 8 (eight) hours. (Patient not taking: Reported on 07/29/2022) 21 capsule 0    docusate sodium (COLACE) 100 MG capsule Take 1 capsule (100 mg total) by mouth daily as needed. 30 capsule 2    FLUoxetine (PROZAC) 10 MG capsule Take 1 capsule (10 mg total) by mouth daily. 30 capsule 0     hydrOXYzine (ATARAX) 25 MG tablet Take 1 tablet (25 mg total) by mouth 3 (three) times daily as needed for anxiety. 30 tablet 0    neomycin-bacitracin-polymyxin [phone] OINT Apply 1 Application topically 2 (two) times daily. (Patient not taking: Reported on 07/29/2022) 15 g 0    nicotine (NICODERM CQ - DOSED IN MG/24 HOURS) 14 mg/24hr patch Place 1 patch (14 mg total) onto the skin daily. 28 patch 0    nitrofurantoin, macrocrystal-monohydrate, (MACROBID) 100 MG capsule Take 1 capsule (100 mg total) by mouth every 12 (twelve) hours. (Patient not taking: Reported on 07/29/2022) 10 capsule 0    predniSONE (DELTASONE) 20 MG tablet Take 2 tablets (40 mg total) by mouth daily. (Patient not taking: Reported on 07/29/2022) 10 tablet 0    promethazine-dextromethorphan (PROMETHAZINE-DM) 6.25-15 MG/5ML syrup Take 5 mLs by mouth 4 (four) times daily as needed for cough. (Patient not taking: Reported on 07/29/2022) 118 mL 0    traZODone (DESYREL) 50 MG tablet Take 1 tablet (50 mg total) by mouth at bedtime as needed for sleep. 30 tablet 0     Patient Stressors: Marital or family conflict    Patient Strengths: Manufacturing systems engineer  Physical Health   Treatment Modalities: Medication Management, Group therapy, Case management,  1 to 1 session with clinician, Psychoeducation, Recreational therapy.   Physician Treatment Plan  for Primary Diagnosis: Bipolar 2 disorder, major depressive episode (HCC) Long Term Goal(s): Improvement in symptoms so as ready for discharge   Short Term Goals: Ability to identify changes in lifestyle to reduce recurrence of condition will improve Ability to verbalize feelings will improve Ability to disclose and discuss suicidal ideas Ability to demonstrate self-control will improve Ability to identify and develop effective coping behaviors will improve  Medication Management: Evaluate patient's response, side effects, and tolerance of medication regimen.  Therapeutic  Interventions: 1 to 1 sessions, Unit Group sessions and Medication administration.  Evaluation of Outcomes: Progressing  Physician Treatment Plan for Secondary Diagnosis: Principal Problem:   Bipolar 2 disorder, major depressive episode (HCC) Active Problems:   PTSD (post-traumatic stress disorder)  Long Term Goal(s): Improvement in symptoms so as ready for discharge   Short Term Goals: Ability to identify changes in lifestyle to reduce recurrence of condition will improve Ability to verbalize feelings will improve Ability to disclose and discuss suicidal ideas Ability to demonstrate self-control will improve Ability to identify and develop effective coping behaviors will improve     Medication Management: Evaluate patient's response, side effects, and tolerance of medication regimen.  Therapeutic Interventions: 1 to 1 sessions, Unit Group sessions and Medication administration.  Evaluation of Outcomes: Progressing   RN Treatment Plan for Primary Diagnosis: Bipolar 2 disorder, major depressive episode (HCC) Long Term Goal(s): Knowledge of disease and therapeutic regimen to maintain health will improve  Short Term Goals: Ability to remain free from injury will improve, Ability to participate in decision making will improve, Ability to verbalize feelings will improve, Ability to disclose and discuss suicidal ideas, and Ability to identify and develop effective coping behaviors will improve  Medication Management: RN will administer medications as ordered by provider, will assess and evaluate patient's response and provide education to patient for prescribed medication. RN will report any adverse and/or side effects to prescribing provider.  Therapeutic Interventions: 1 on 1 counseling sessions, Psychoeducation, Medication administration, Evaluate responses to treatment, Monitor vital signs and CBGs as ordered, Perform/monitor CIWA, COWS, AIMS and Fall Risk screenings as ordered, Perform  wound care treatments as ordered.  Evaluation of Outcomes: Progressing   LCSW Treatment Plan for Primary Diagnosis: Bipolar 2 disorder, major depressive episode (HCC) Long Term Goal(s): Safe transition to appropriate next level of care at discharge, Engage patient in therapeutic group addressing interpersonal concerns.  Short Term Goals: Engage patient in aftercare planning with referrals and resources, Increase social support, Increase emotional regulation, Facilitate acceptance of mental health diagnosis and concerns, Identify triggers associated with mental health/substance abuse issues, and Increase skills for wellness and recovery  Therapeutic Interventions: Assess for all discharge needs, 1 to 1 time with Social worker, Explore available resources and support systems, Assess for adequacy in community support network, Educate family and significant other(s) on suicide prevention, Complete Psychosocial Assessment, Interpersonal group therapy.  Evaluation of Outcomes: Progressing   Progress in Treatment: Attending groups: Yes. Participating in groups: Yes. Taking medication as prescribed: Yes. Toleration medication: Yes. Family/Significant other contact made: No, will contact:  children's father Lawanda Cousins [phone] Patient understands diagnosis: Yes. Discussing patient identified problems/goals with staff: Yes. Medical problems stabilized or resolved: Yes. Denies suicidal/homicidal ideation: Yes. Issues/concerns per patient self-inventory: No.     New problem(s) identified: No, Describe:  None reported    New Short Term/Long Term Goal(s): medication stabilization, elimination of SI thoughts, development of comprehensive mental wellness plan.     Patient Goals:  "Focus on myself , learn  new coping skills, I have learned two so far, which are using a stress ball and writing "   Discharge Plan or Barriers: Patient recently admitted. CSW will continue to follow and assess for  appropriate referrals and possible discharge planning.     Reason for Continuation of Hospitalization: Anxiety Depression Medication stabilization Suicidal ideation   Estimated Length of Stay:3-5 days   Last 3 [name] Suicide Severity Risk Score: Flowsheet Row [facility] (Current[facility]2022 [facility] Admission (Discharged) from OP [facility]  C-SSRS RISK CATEGORY No Risk No Risk High Risk       Last PHQ 2/9 Scores:    05/26/2022    3:44 PM 05/19/2022    4:30 PM 05/13/2022    2:04 PM  Depression screen PHQ 2/9  Decreased Interest 0 0 0  Down, Depressed, Hopeless 0 1 0  PHQ - 2 Score 0 1 0  Altered sleeping 1 1 1   Tired, decreased energy 1 1 1   Change in appetite 1 1 1   Feeling bad or failure about yourself  0 0 0  Trouble concentrating 0 0 0  Moving slowly or fidgety/restless 0 0 0  Suicidal thoughts 0 0 0  PHQ-9 Score 3 4 3     Scribe for Treatment Team: , 08/04/2022 8:41 AM
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
## 2022-08-04 NOTE — Progress Notes (Signed)
   08/04/22 2100  Psych Admission Type (Psych Patients Only)  Admission Status Voluntary  Psychosocial Assessment  Patient Complaints None  Eye Contact Fair  Facial Expression Animated  Affect Appropriate to circumstance  Speech Logical/coherent  Interaction Assertive  Motor Activity Other (Comment) (WNL)  Appearance/Hygiene Unremarkable  Behavior Characteristics Cooperative;Appropriate to situation  Mood Pleasant  Thought Process  Coherency WDL  Content WDL  Delusions None reported or observed  Perception WDL  Hallucination None reported or observed  Judgment Impaired  Confusion None  Danger to Self  Current suicidal ideation? Denies  Self-Injurious Behavior No self-injurious ideation or behavior indicators observed or expressed   Agreement Not to Harm Self Yes  Description of Agreement verbal  Danger to Others  Danger to Others Reported or observed  Danger to Others Abnormal  Harmful Behavior to others No threats or harm toward other people

## 2022-08-04 NOTE — Group Note (Signed)
Date:  08/04/2022 Time:  9:38 AM  Group Topic/Focus:  Goals Group:   The focus of this group is to help patients establish daily goals to achieve during treatment and discuss how the patient can incorporate goal setting into their daily lives to aide in recovery. Orientation:   The focus of this group is to educate the patient on the purpose and policies of crisis stabilization and provide a format to answer questions about their admission.  The group details unit policies and expectations of patients while admitted.    Participation Level:  Active  Participation Quality:  Appropriate  Affect:  Appropriate  Cognitive:  Appropriate  Insight: Appropriate  Engagement in Group:  Engaged  Modes of Intervention:  Discussion  Additional Comments:  Pt wants to work on discharge.  Jaquita Rector 08/04/2022, 9:38 AM

## 2022-08-04 NOTE — Group Note (Signed)
Date:  08/04/2022 Time:  2:38 PM  Group Topic/Focus:  Self Care:   The focus of this group is to help patients understand the importance of self-care in order to improve or restore emotional, physical, spiritual, interpersonal, and financial health.    Participation Level:  Active  Participation Quality:  Appropriate  Affect:  Appropriate  Cognitive:  Appropriate  Insight: Appropriate  Engagement in Group:  Engaged  Modes of Intervention:  Discussion  Additional Comments:  We went over Self-Encouragement handouts  Jaquita Rector 08/04/2022, 2:38 PM

## 2022-08-05 DIAGNOSIS — F3181 Bipolar II disorder: Secondary | ICD-10-CM | POA: Diagnosis not present

## 2022-08-05 MED ORDER — ARIPIPRAZOLE 10 MG PO TABS: 10.0000 mg | ORAL_TABLET | Freq: Every day | ORAL | 0 refills | Status: DC

## 2022-08-05 MED ORDER — NICOTINE 14 MG/24HR TD PT24: 14.0000 mg | MEDICATED_PATCH | Freq: Every day | TRANSDERMAL | 0 refills | Status: DC

## 2022-08-05 NOTE — Progress Notes (Signed)
   08/05/22 0615  15 Minute Checks  Location Bathroom/Shower  Visual Appearance Calm  Behavior Composed  Sleep (Behavioral Health Patients Only)  Calculate sleep? (Click Yes once per 24 hr at 0600 safety check) Yes  Documented sleep last 24 hours 7.5
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
## 2022-08-05 NOTE — Group Note (Signed)
Date:  08/05/2022 Time:  10:01 AM  Group Topic/Focus:  Goals Group:   The focus of this group is to help patients establish daily goals to achieve during treatment and discuss how the patient can incorporate goal setting into their daily lives to aide in recovery.    Participation Level:  Active  Participation Quality:  Appropriate  Affect:  Appropriate  Cognitive:  Appropriate  Insight: Appropriate  Engagement in Group:  Engaged  Modes of Intervention:  Orientation  Additional Comments:     Reymundo Poll 08/05/2022, 10:01 AM

## 2022-08-05 NOTE — Progress Notes (Signed)
  [facility] Adult Case Management Discharge Plan :  Will you be returning to the same living situation after discharge:  Yes,    At discharge, do you have transportation home?: Yes,  Public Transportation/Bus Pass Do you have the ability to pay for your medications: Yes,  Insured  Release of information consent forms completed and in the chart;  Patient's signature needed at discharge.  Patient to Follow up at:  Follow-up Information     [facility] Follow up.   Why: Continue to follow up with your Assertive Community Treatment Team at discharge. Contact information: [address] [place] [address] [phone]         [facility]. Go in 3 week(s).   Contact information: [address] [place], [place] [address]                Next level of care provider has access to [facility] Link:yes  Safety Planning and Suicide Prevention discussed: Yes,   Lawanda Cousins [phone]      Has patient been referred to the Quitline?: Patient refused referral  Patient has been referred for addiction treatment: N/A Patient to continue working towards treatment goals after discharge. Patient no longer meets criteria for inpatient criteria per attending physician. Continue taking medications as prescribed, nursing to provide instructions at discharge. Follow up with all scheduled appointments.   Joeanne Robicheaux S Vincie Linn, LCSW 08/05/2022, 11:03 AM

## 2022-08-05 NOTE — Progress Notes (Signed)
Patient discharged from [facility] on 08/05/2022 at 11:00am. Patient denies SI, plan, and intention. Suicide safety plan completed, reviewed with this RN, given to the patient, and a copy in the chart. Patient denies HI/AVH upon discharge. Patient rates her depression a 0/10 and her anxiety a 0/10. Patient is alert, oriented, and cooperative. RN provided patient with discharge paperwork and reviewed information with patient. Patient expressed that she understood all of the discharge instructions. Pt was satisfied with belongings returned to her from the locker and at bedside. Discharged patient to [facility] waiting room. Pt walked to local bus stop upon discharge.

## 2022-08-05 NOTE — [facility] Suicide Risk Assessment (Signed)
[facility] Discharge Suicide Risk Assessment   Principal Problem: Bipolar 2 disorder, major depressive episode (HCC) Discharge Diagnoses: Principal Problem:   Bipolar 2 disorder, major depressive episode (HCC) Active Problems:   PTSD (post-traumatic stress disorder)   Total Time spent with patient: 45 minutes  Reason for admission: Sharon Ward is a 26 y.o., female with a past psychiatric history significant for bipolar disorder who presents to the [facility] from [facility] emergency room for evaluation and management of increased depression after overdose on her medications secondary to social stressors related to receiving a call from DSS that her 24 years old will continue to be in foster care.  According to outside records, the patient presented after intentional overdose secondary to family issues and social stressors.  PTA Medications:  Abilify 5 mg daily, Prozac 10 mg daily, Vistaril 25 mg as needed for anxiety and trazodone 50 mg at night as needed for sleep.   Hospital Course:   At time of admission patient regretted her suicide attempt and describes it as impulsive, she also reported Abilify was helpful for mood stability and depression but dose was low.  QTc prolongation was noted prior to admission probably related to trazodone overdose as well as Abilify overdose, QTc was noted to improve significantly and normalized by day 2 of admission, please note Abilify is least antipsychotic to cause QTc prolongation and I believe QTc prolongation prior to admission was mainly related to his overdose on trazodone.  During the patient's hospitalization, patient had extensive initial psychiatric evaluation, and follow-up psychiatric evaluations every day.  Psychiatric diagnoses provided upon initial assessment: Principal Problem:   Bipolar 2 disorder, major depressive episode (HCC) Active Problems:   PTSD (post-traumatic stress disorder)  Patient's psychiatric medications  were adjusted on admission: Restart Abilify 5 mg at bedtime and titrate to 10 mg at bedtime tomorrow night Vistaril 25 mg every 8 hours as needed for anxiety  During the hospitalization, other adjustments were made to the patient's psychiatric medication regimen: Abilify was continued for depression and mood stabilization and titrated up to 10 mg nightly with good efficacy and safety noted, patient reported good sleep without need for trazodone patient did not require any Vistaril as needed for anxiety during her hospital stay.  Patient's care was discussed during the interdisciplinary team meeting every day during the hospitalization.  The patient denied having side effects to prescribed psychiatric medication.  Gradually, patient started adjusting to milieu. The patient was evaluated each day by a clinical provider to ascertain response to treatment. Improvement was noted by the patient's report of decreasing symptoms, improved sleep and appetite, affect, medication tolerance, behavior, and participation in unit programming.  Patient was asked each day to complete a self inventory noting mood, mental status, pain, new symptoms, anxiety and concerns.    Symptoms were reported as significantly decreased or resolved completely by discharge.  During hospital stay I spoke with patient's boyfriend who is the main support and he agreed to manage patient's medication and provide her only with 4 to 5 days worth of medication appetite to avoid any overdose in the future, patient agreed with this plan  On day of discharge, patient was evaluated on 08/05/2022 the patient reports that their mood is stable. The patient denied having suicidal thoughts for more than 48 hours prior to discharge.  Patient denies having homicidal thoughts.  Patient denies having auditory hallucinations.  Patient denies any visual hallucinations or other symptoms of psychosis. The patient was motivated to continue taking  medication with  a goal of continued improvement in mental health.  Patient was able to discuss coping skills with stressors as well as crisis plan if worsening depression or recurring SI after discharge. The patient reports their target psychiatric symptoms of mood instability, increased depression, suicide attempt responded well to the psychiatric medications, and the patient reports overall benefit other psychiatric hospitalization. Supportive psychotherapy was provided to the patient. The patient also participated in regular group therapy while hospitalized. Coping skills, problem solving as well as relaxation therapies were also part of the unit programming.  Labs were reviewed with the patient, and abnormal results were discussed with the patient.  The patient is able to verbalize their individual safety plan to this provider.  Behavioral Events: None  Restraints: None  Groups: Attended and participated  Medications Changes: As above  D/C Medications: Abilify 10 mg nightly for mood stabilization and depression, nuclear 14 mg patch daily for smoking cessation  Sleep  Sleep:No data recorded  Musculoskeletal: Strength & Muscle Tone: within normal limits Gait & Station: normal Patient leans: N/A  Psychiatric Specialty Exam  General Appearance: appears at stated age, fairly dressed and groomed  Behavior: pleasant and cooperative  Psychomotor Activity:No psychomotor agitation or retardation noted   Eye Contact: good Speech: normal amount, tone, volume and latency   Mood: euthymic Affect: congruent, pleasant and interactive  Thought Process: linear, goal directed, no circumstantial or tangential thought process noted, no racing thoughts or flight of ideas Descriptions of Associations: intact Thought Content: Hallucinations: denies AH, VH , does not appear responding to stimuli Delusions: No paranoia or other delusions noted Suicidal Thoughts: denies SI, intention, plan  Homicidal  Thoughts: denies HI, intention, plan   Alertness/Orientation: alert and fully oriented  Insight: fair, improved Judgment: fair, improved  Memory: intact  Executive Functions  Concentration: intact  Attention Span: Fair Recall: intact Fund of Knowledge: fair   Community education officer  Concentration: intact Attention Span: Fair Recall: intact Fund of Knowledge: fair   Assets  Assets: Housing; Data processing manager; Armed forces logistics/support/administrative officer   Physical Exam: Physical Exam ROS Blood pressure (!) 116/93, pulse (!) 115, temperature 98.1 F (36.7 C), temperature source Oral, resp. rate 18, height 5\' 3"  (1.6 m), weight 97.5 kg, last menstrual period 07/21/2022, SpO2 100 %, not currently breastfeeding. Body mass index is 38.09 kg/m.  Mental Status Per Nursing Assessment::   On Admission:  NA  Demographic Factors:  Adolescent or young adult and Caucasian  Loss Factors: Legal issues  Historical Factors: Prior suicide attempts and Impulsivity  Risk Reduction Factors:   Positive therapeutic relationship and Positive coping skills or problem solving skills  Continued Clinical Symptoms: Improved significantly since admission Bipolar Disorder:   Depressive phase  Cognitive Features That Contribute To Risk:  None    Suicide Risk:  Minimal: No identifiable suicidal ideation.  Patients presenting with no risk factors but with morbid ruminations; may be classified as minimal risk based on the severity of the depressive symptoms   Follow-up [facility] Follow up.   Why: Continue to follow up with your Assertive Community Treatment Team at discharge. Contact information: [place] [address] [phone]         [facility]. Go in 3 week(s).   Contact information: [address] [place], [place] [address]                Plan Of Care/Follow-up recommendations:   Discharge recommendations:     Activity:  as tolerated  Diet: heart healthy  # It is recommended to the patient to continue psychiatric medications as prescribed, after discharge from the hospital.     # It is recommended to the patient to follow up with your outpatient psychiatric provider and PCP.   # It was discussed with the patient, the impact of alcohol, drugs, tobacco have been there overall psychiatric and medical wellbeing, and total abstinence from substance use was recommended the patient.ed.   # Prescriptions provided or sent directly to preferred pharmacy at discharge. Patient agreeable to plan. Given opportunity to ask questions. Appears to feel comfortable with discharge.    # In the event of worsening symptoms, the patient is instructed to call the crisis hotline, 911 and or go to the nearest ED for appropriate evaluation and treatment of symptoms. To follow-up with primary care provider for other medical issues, concerns and or health care needs   # Patient was discharged home with a plan to follow up as noted above.    Patient agrees with D/C instructions and plan.  The patient received suicide prevention pamphlet:  Yes Belongings returned:  Clothing and Valuables  Total Time Spent in Direct Patient Care:  I personally spent 45 minutes on the unit in direct patient care. The direct patient care time included face-to-face time with the patient, reviewing the patient's chart, communicating with other professionals, and coordinating care. Greater than 50% of this time was spent in counseling or coordinating care with the patient regarding goals of hospitalization, psycho-education, and discharge planning needs.   Kayti Poss 08/05/2022, 10:21 AM   Kacyn Souder Winfred Leeds, MD 08/05/2022, 10:21 AM

## 2022-08-10 ENCOUNTER — Ambulatory Visit: Payer: Medicaid Other

## 2022-08-17 ENCOUNTER — Ambulatory Visit: Payer: Medicaid Other | Admitting: Family Medicine

## 2022-08-18 ENCOUNTER — Other Ambulatory Visit (HOSPITAL_COMMUNITY)
Admission: RE | Admit: 2022-08-18 | Discharge: 2022-08-18 | Disposition: A | Discharge status: Home / Self Care | Payer: Medicaid Other | Source: Ambulatory Visit | Attending: Obstetrics and Gynecology | Admitting: Obstetrics and Gynecology

## 2022-08-18 ENCOUNTER — Ambulatory Visit (INDEPENDENT_AMBULATORY_CARE_PROVIDER_SITE_OTHER): Payer: Medicaid Other | Admitting: Obstetrics and Gynecology

## 2022-08-18 ENCOUNTER — Encounter: Payer: Self-pay | Admitting: Obstetrics and Gynecology

## 2022-08-18 VITALS — BP 103/70 | HR 59

## 2022-08-18 DIAGNOSIS — Z124 Encounter for screening for malignant neoplasm of cervix: Secondary | ICD-10-CM

## 2022-08-18 DIAGNOSIS — Z3202 Encounter for pregnancy test, result negative: Secondary | ICD-10-CM | POA: Diagnosis not present

## 2022-08-18 DIAGNOSIS — Z30430 Encounter for insertion of intrauterine contraceptive device: Secondary | ICD-10-CM

## 2022-08-18 LAB — POCT PREGNANCY, URINE: Preg Test, Ur: NEGATIVE

## 2022-08-19 ENCOUNTER — Encounter: Payer: Self-pay | Admitting: General Practice

## 2022-08-19 LAB — CYTOLOGY - PAP
Comment: NEGATIVE
Diagnosis: NEGATIVE
High risk HPV: NEGATIVE

## 2022-08-19 NOTE — Progress Notes (Signed)
GYNECOLOGY VISIT  [name] MRN [id number]  Date of birth: 10-25-1995 Chief Complaint:   IUD Insertion   History:  Sharon Ward is a 27 y.o. [id number] being seen today for IUD insertion. Would like paragard - 1st inserted incorrectly and expelled nearly immediately. 2nd inserted, heavy bleeding and removed due to incorrect insertion. Wants to avoid hormones, sure she would like to try again for IUD insertion.    Past Medical History:  Diagnosis Date   Asthma    Bipolar 1 disorder, mixed ([place])    Depression    Generalized anxiety disorder    Intentional drug overdose ([place]) 11/03/2020   Low-lying placenta 01/07/2022   Resolved 03/04/22   Relationship dysfunction    Suicide attempt ([place]) 11/02/2020    Past Surgical History:  Procedure Laterality Date   PILONIDAL CYST / SINUS EXCISION  09/11/2013   PILONIDAL CYST EXCISION  05/17/2014   Pilonidal cystectomy with cleft lip    The following portions of the patient's history were reviewed and updated as appropriate: allergies, current medications, past family history, past medical history, past social history, past surgical history and problem list.   Health Maintenance:   Last pap 05/2019. Results were: NILM w/ HRHPV negative. H/O abnormal pap: no Last mammogram: n/a   Review of Systems:  Pertinent items are noted in HPI. Comprehensive review of systems was otherwise negative.   Objective:  Physical Exam BP 103/70   Pulse (!) 59   LMP 07/21/2022    Physical Exam Vitals and nursing note reviewed. Exam conducted with a chaperone present.  Constitutional:      Appearance: Normal appearance.  HENT:     Head: Normocephalic and atraumatic.  Pulmonary:     Effort: Pulmonary effort is normal.     Breath sounds: Normal breath sounds.  Genitourinary:    General: Normal vulva.     Exam position: Lithotomy position.     Vagina: Normal.     Cervix: Normal.  Skin:    General: Skin is warm and dry.   Neurological:     General: No focal deficit present.     Mental Status: She is alert.  Psychiatric:        Mood and Affect: Mood normal.        Behavior: Behavior normal.        Thought Content: Thought content normal.        Judgment: Judgment normal.    IUD Insertion Procedure Note Patient identified, informed consent performed, consent signed.   Discussed risks of irregular bleeding, cramping, infection, malpositioning or misplacement of the IUD outside the uterus which may require further procedure such as laparoscopy. Also discussed >99% contraception efficacy, increased risk of ectopic pregnancy with failure of method.  Time out was performed.  Urine pregnancy test negative.  Speculum placed in the vagina with lidocaine jelly as lubricant.  Cervix visualized.  Cleaned with Betadine x 2.  Anterior cervix infiltrated with 0.5cc of 1% lidocaine.   Paracervical block was administered and the endocervical canal instilled with lidocaine. Disposable dilator used to sound the uterus to ~8cm.  Paragard IUD placed per manufacturer's recommendations.  Strings trimmed to 3 cm.   Patient tolerated procedure well.   Patient was given post-procedure instructions.  She was advised to have backup contraception for one week.  Patient was also asked to check IUD strings periodically and follow up prn for IUD check.    Assessment & Plan:   1. Encounter for IUD  insertion S/p uncomplicated IUD insertion. Return for string check. Counseled on anticipated menstrual changes. Reviewed alternative contraceptive options. Also noted increase risk of expulsion given hx of expulsion.   2. Screening for malignant neoplasm of cervix Routine pap collected - Cytology - PAP( [facility])   >50% of 30 min visit spent on counseling and coordination of care.   Routine preventative health maintenance measures emphasized.  Darliss Cheney, MD [facility]

## 2022-08-20 ENCOUNTER — Ambulatory Visit (HOSPITAL_COMMUNITY)
Admission: EM | Admit: 2022-08-20 | Discharge: 2022-08-21 | Disposition: A | Discharge status: Home / Self Care | Payer: Medicaid Other | Attending: Nurse Practitioner | Admitting: Nurse Practitioner

## 2022-08-20 ENCOUNTER — Encounter: Payer: Self-pay | Admitting: Obstetrics and Gynecology

## 2022-08-20 DIAGNOSIS — Z1152 Encounter for screening for COVID-19: Secondary | ICD-10-CM | POA: Insufficient documentation

## 2022-08-20 DIAGNOSIS — R45851 Suicidal ideations: Secondary | ICD-10-CM | POA: Insufficient documentation

## 2022-08-20 DIAGNOSIS — F3181 Bipolar II disorder: Secondary | ICD-10-CM | POA: Insufficient documentation
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
## 2022-08-20 NOTE — ED Triage Notes (Signed)
Pt presents to [facility] voluntarily, accompanied by GPD with complaint of suicidal thoughts with no plan. Pt reports feeling anxious and having a panic attack due to her living arrangements. Pt also reports having an argument with her child's father and DSS involvement that triggered her thoughts of wanting to hurt herself. Pt stated history of Bipolar, Depression and anxiety and is currently prescribed Abilify, in which she is compliant at this time. Pt reports having an ACTT team, but feels that her mental health is not always taken seriously. Pt has significant history of suicide attempts and prior psychiatric hospitalizations. Pt currently denies HI, AVH and substance/alcohol use.

## 2022-08-21 ENCOUNTER — Other Ambulatory Visit: Payer: Self-pay

## 2022-08-21 DIAGNOSIS — F3181 Bipolar II disorder: Secondary | ICD-10-CM | POA: Diagnosis not present

## 2022-08-21 DIAGNOSIS — R45851 Suicidal ideations: Secondary | ICD-10-CM | POA: Diagnosis not present

## 2022-08-21 DIAGNOSIS — Z1152 Encounter for screening for COVID-19: Secondary | ICD-10-CM | POA: Diagnosis not present

## 2022-08-21 LAB — CBC WITH DIFFERENTIAL/PLATELET
Abs Immature Granulocytes: 0.02 10*3/uL (ref 0.00–0.07)
Basophils Absolute: 0.1 10*3/uL (ref 0.0–0.1)
Basophils Relative: 1 %
Eosinophils Absolute: 0.3 10*3/uL (ref 0.0–0.5)
Eosinophils Relative: 4 %
HCT: 43 % (ref 36.0–46.0)
Hemoglobin: 14.1 g/dL (ref 12.0–15.0)
Immature Granulocytes: 0 %
Lymphocytes Relative: 35 %
Lymphs Abs: 2.9 10*3/uL (ref 0.7–4.0)
MCH: 30.1 pg (ref 26.0–34.0)
MCHC: 32.8 g/dL (ref 30.0–36.0)
MCV: 91.9 fL (ref 80.0–100.0)
Monocytes Absolute: 0.5 10*3/uL (ref 0.1–1.0)
Monocytes Relative: 6 %
Neutro Abs: 4.6 10*3/uL (ref 1.7–7.7)
Neutrophils Relative %: 54 %
Platelets: 306 10*3/uL (ref 150–400)
RBC: 4.68 MIL/uL (ref 3.87–5.11)
RDW: 13.4 % (ref 11.5–15.5)
WBC: 8.3 10*3/uL (ref 4.0–10.5)
nRBC: 0 % (ref 0.0–0.2)

## 2022-08-21 LAB — ETHANOL: Alcohol, Ethyl (B): <10 mg/dL (ref <10)

## 2022-08-21 LAB — POCT URINE DRUG SCREEN - MANUAL ENTRY (I-SCREEN)
POC Amphetamine UR: NOT DETECTED
POC Buprenorphine (BUP): NOT DETECTED
POC Cocaine UR: NOT DETECTED
POC Marijuana UR: NOT DETECTED
POC Methadone UR: NOT DETECTED
POC Methamphetamine UR: NOT DETECTED
POC Morphine: NOT DETECTED
POC Oxazepam (BZO): NOT DETECTED
POC Oxycodone UR: NOT DETECTED
POC Secobarbital (BAR): NOT DETECTED

## 2022-08-21 LAB — COMPREHENSIVE METABOLIC PANEL
ALT: 35 U/L (ref 0–44)
AST: 23 U/L (ref 15–41)
Albumin: 4.3 g/dL (ref 3.5–5.0)
Alkaline Phosphatase: 62 U/L (ref 38–126)
Anion gap: 11 (ref 5–15)
BUN: 10 mg/dL (ref 6–20)
CO2: 26 mmol/L (ref 22–32)
Calcium: 9.7 mg/dL (ref 8.9–10.3)
Chloride: 103 mmol/L (ref 98–111)
Creatinine, Ser: 0.82 mg/dL (ref 0.44–1.00)
GFR, Estimated: >60 mL/min (ref >60)
Glucose, Bld: 86 mg/dL (ref 70–99)
Potassium: 3.7 mmol/L (ref 3.5–5.1)
Sodium: 140 mmol/L (ref 135–145)
Total Bilirubin: 0.5 mg/dL (ref 0.3–1.2)
Total Protein: 6.8 g/dL (ref 6.5–8.1)

## 2022-08-21 LAB — POC URINE PREG, ED: Preg Test, Ur: NEGATIVE

## 2022-08-21 LAB — POCT PREGNANCY, URINE: Preg Test, Ur: NEGATIVE

## 2022-08-21 LAB — RESP PANEL BY RT-PCR (RSV, FLU A&B, COVID)  RVPGX2
Influenza A by PCR: NEGATIVE
Influenza B by PCR: NEGATIVE
Resp Syncytial Virus by PCR: NEGATIVE
SARS Coronavirus 2 by RT PCR: NEGATIVE

## 2022-08-21 LAB — POC SARS CORONAVIRUS 2 AG: SARSCOV2ONAVIRUS 2 AG: NEGATIVE

## 2022-08-21 MED ORDER — ACETAMINOPHEN 325 MG PO TABS: 650.0000 mg | ORAL_TABLET | Freq: Four times a day (QID) | ORAL | Status: DC | PRN

## 2022-08-21 MED ORDER — ALUM & MAG HYDROXIDE-SIMETH 200-200-20 MG/5ML PO SUSP: 30.0000 mL | ORAL | Status: DC | PRN

## 2022-08-21 MED ORDER — MAGNESIUM HYDROXIDE 400 MG/5ML PO SUSP: 30.0000 mL | Freq: Every day | ORAL | Status: DC | PRN

## 2022-08-21 MED ORDER — HYDROXYZINE HCL 25 MG PO TABS: 25.0000 mg | ORAL_TABLET | Freq: Three times a day (TID) | ORAL | Status: DC | PRN

## 2022-08-21 NOTE — ED Notes (Signed)
Pt continues to sit quietly in bed. No distress noted. Awaiting provider's dispo

## 2022-08-21 NOTE — ED Notes (Signed)
Pt A&O x 4, presents passive suicidal ideations, no plan noted.  Denies HI or AVH.  Pt calm & cooperative.  No distress noted.  Monitoring for safety.

## 2022-08-21 NOTE — ED Notes (Signed)
Pt resting quietly, breathing even and unlabored.  Staff will continue to monitor for safety. 

## 2022-08-21 NOTE — ED Notes (Signed)
Pt sleeping@this time. Breathing even and unlabored. Will continue to monitor for safety 

## 2022-08-21 NOTE — ED Provider Notes (Signed)
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 MIL/uL Final   Hemoglobin 07/28/2022 12.5  12.0 - 15.0 g/dL Final   HCT 40/05/2724 38.4  36.0 - 46.0 % Final   MCV  07/28/2022 92.5  80.0 - 100.0 fL Final   MCH 07/28/2022 30.1  26.0 - 34.0 pg Final   MCHC 07/28/2022 32.6  30.0 - 36.0 g/dL Final   RDW 36/64/4034 12.9  11.5 - 15.5 % Final   Platelets 07/28/2022 260  150 - 400 K/uL Final   nRBC 07/28/2022 0.0  0.0 - 0.2 % Final   Neutrophils Relative % 07/28/2022 57  % Final   Neutro Abs 07/28/2022 5.1  1.7 - 7.7 K/uL Final   Lymphocytes Relative 07/28/2022 32  % Final   Lymphs Abs 07/28/2022 2.8  0.7 -  4.0 K/uL Final   Monocytes Relative 07/28/2022 8  % Final   Monocytes Absolute 07/28/2022 0.7  0.1 - 1.0 K/uL Final   Eosinophils Relative 07/28/2022 1  % Final   Eosinophils Absolute 07/28/2022 0.1  0.0 - 0.5 K/uL Final   Basophils Relative 07/28/2022 1  % Final   Basophils Absolute 07/28/2022 0.0  0.0 - 0.1 K/uL Final   Immature Granulocytes 07/28/2022 1  % Final   Abs Immature Granulocytes 07/28/2022 0.04  0.00 - 0.07 K/uL Final   Performed at [facility], [address] [place], [place] [address]   I-stat hCG, quantitative 07/28/2022 <5.0  <5 mIU/mL Final   Comment 3 07/28/2022          Final   Comment:   GEST. AGE      CONC.  (mIU/mL)   <=1 WEEK        5 - 50     2 WEEKS       50 - 500     3 WEEKS       100 - 10,000     4 WEEKS     1,000 - 30,000        FEMALE AND NON-PREGNANT FEMALE:     LESS THAN 5 mIU/mL    Salicylate Lvl 07/28/2022 <7.0 (L)  7.0 - 30.0 mg/dL Final   Performed at [facility], [address] [place], [place] [address]   Acetaminophen (Tylenol), Serum 07/28/2022 <10 (L)  10 - 30 ug/mL Final   Comment: (NOTE) Therapeutic concentrations vary significantly. A range of 10-30 ug/mL  may be an effective concentration for many patients. However, some  are best treated at concentrations outside of this range. Acetaminophen concentrations >150 ug/mL at 4 hours after ingestion  and >50 ug/mL at 12 hours after ingestion are often associated with  toxic reactions.  Performed at [facility], [address] [place], [place] [address]    Color, Urine 07/29/2022 YELLOW  YELLOW Final   APPearance 07/29/2022 HAZY (A)  CLEAR Final   Specific Gravity, Urine 07/29/2022 1.010  1.005 - 1.030 Final   pH 07/29/2022 6.0  5.0 - 8.0 Final   Glucose, UA 07/29/2022 NEGATIVE  NEGATIVE mg/dL Final   Hgb urine dipstick 07/29/2022 NEGATIVE  NEGATIVE Final   Bilirubin Urine 07/29/2022 NEGATIVE  NEGATIVE Final   Ketones, ur 07/29/2022 NEGATIVE  NEGATIVE mg/dL Final   Protein, ur 91/47/829512/21/2023 NEGATIVE  NEGATIVE mg/dL Final   Nitrite 62/13/086512/21/2023 NEGATIVE  NEGATIVE Final   Leukocytes,Ua 07/29/2022 MODERATE (A)  NEGATIVE Final   RBC / HPF 07/29/2022 0-5  0 - 5 RBC/hpf Final   WBC, UA 07/29/2022 11-20  0 - 5 WBC/hpf Final   Bacteria, UA 07/29/2022 FEW (A)  NONE SEEN Final   Squamous Epithelial / HPF 07/29/2022 21-50  0 - 5 Final   Mucus 07/29/2022 PRESENT   Final   Performed at [facility], [address] [place], [place] [address]   Magnesium 07/28/2022 2.2  1.7 - 2.4 mg/dL Final   Performed at [facility], [address] [place], [place] [address]   Sodium 07/29/2022 140  135 - 145 mmol/L Final   Potassium 07/29/2022 4.1  3.5 - 5.1 mmol/L Final   Chloride 07/29/2022 110  98 - 111 mmol/L Final   CO2 07/29/2022 23  22 - 32 mmol/L Final   Glucose, Bld 07/29/2022 100 (H)  70 - 99 mg/dL Final  Glucose reference range applies only to samples taken after fasting for at least 8 hours.   BUN 07/29/2022 13  6 - 20 mg/dL Final   Creatinine, Ser 07/29/2022 0.76  0.44 - 1.00 mg/dL Final   Calcium 09/81/1914 8.5 (L)  8.9 - 10.3 mg/dL Final   GFR, Estimated 07/29/2022 >60  >60 mL/min Final   Comment: (NOTE) Calculated using the CKD-EPI Creatinine Equation (2021)    Anion gap 07/29/2022 7  5 - 15 Final   Performed at [facility], [address], [place], [place] [address]   Acetaminophen (Tylenol), Serum 07/29/2022 <10 (L)  10  - 30 ug/mL Final   Comment: (NOTE) Therapeutic concentrations vary significantly. A range of 10-30 ug/mL  may be an effective concentration for many patients. However, some  are best treated at concentrations outside of this range. Acetaminophen concentrations >150 ug/mL at 4 hours after ingestion  and >50 ug/mL at 12 hours after ingestion are often associated with  toxic reactions.  Performed at [facility], [address] [place], [place] [address]    SARS Coronavirus 2 by RT PCR 07/29/2022 NEGATIVE  NEGATIVE Final   Comment: (NOTE) SARS-CoV-2 target nucleic acids are NOT DETECTED.  The SARS-CoV-2 RNA is generally detectable in upper respiratory specimens during the acute phase of infection. The lowest concentration of SARS-CoV-2 viral copies this assay can detect is 138 copies/mL. A negative result does not preclude SARS-Cov-2 infection and should not be used as the sole basis for treatment or other patient management decisions. A negative result may occur with  improper specimen collection/handling, submission of specimen other than nasopharyngeal swab, presence of viral mutation(s) within the areas targeted by this assay, and inadequate number of viral copies(<138 copies/mL). A negative result must be combined with clinical observations, patient history, and epidemiological information. The expected result is Negative.  Fact Sheet for Patients:  BloggerCourse.com  Fact Sheet for Healthcare Providers:  SeriousBroker.it  This test is no                          t yet approved or cleared by the [place] FDA and  has been authorized for detection and/or diagnosis of SARS-CoV-2 by FDA under an Emergency Use Authorization (EUA). This EUA will remain  in effect (meaning this test can be used) for the duration of the COVID-19 declaration under Section 564(b)(1) of the Act, 21 U.S.C.section 360bbb-3(b)(1),  unless the authorization is terminated  or revoked sooner.       Influenza A by PCR 07/29/2022 NEGATIVE  NEGATIVE Final   Influenza B by PCR 07/29/2022 NEGATIVE  NEGATIVE Final   Comment: (NOTE) The Xpert Xpress SARS-CoV-2/FLU/RSV plus assay is intended as an aid in the diagnosis of influenza from Nasopharyngeal swab specimens and should not be used as a sole basis for treatment. Nasal washings and aspirates are unacceptable for Xpert Xpress SARS-CoV-2/FLU/RSV testing.  Fact Sheet for Patients: BloggerCourse.com  Fact Sheet for Healthcare Providers: SeriousBroker.it  This test is not yet approved or cleared by the [place] FDA and has been authorized for detection and/or diagnosis of SARS-CoV-2 by FDA under an Emergency Use Authorization (EUA). This EUA will remain in effect (meaning this test can be used) for the duration of the COVID-19 declaration under Section 564(b)(1) of the Act, 21 U.S.C. section 360bbb-3(b)(1), unless the authorization is terminated or revoked.     Resp Syncytial Virus by PCR 07/29/2022 NEGATIVE  NEGATIVE Final  Comment: (NOTE) Fact Sheet for Patients: BloggerCourse.com  Fact Sheet for Healthcare Providers: SeriousBroker.it  This test is not yet approved or cleared by the [place] FDA and has been authorized for detection and/or diagnosis of SARS-CoV-2 by FDA under an Emergency Use Authorization (EUA). This EUA will remain in effect (meaning this test can be used) for the duration of the COVID-19 declaration under Section 564(b)(1) of the Act, 21 U.S.C. section 360bbb-3(b)(1), unless the authorization is terminated or revoked.  Performed at [facility], [address] [place], [place] [address]   Admission on 07/21/2022, Discharged on 07/21/2022  Component Date Value Ref Range Status   SARS Coronavirus 2 07/21/2022  POSITIVE (A)  NEGATIVE Final   Comment: (NOTE) SARS-CoV-2 target nucleic acids are DETECTED.  The SARS-CoV-2 RNA is generally detectable in upper and lower respiratory specimens during the acute phase of infection. Positive results are indicative of the presence of SARS-CoV-2 RNA. Clinical correlation with patient history and other diagnostic information is  necessary to determine patient infection status. Positive results do not rule out bacterial infection or co-infection with other viruses.  The expected result is Negative.  Fact Sheet for Patients: HairSlick.no  Fact Sheet for Healthcare Providers: quierodirigir.com  This test is not yet approved or cleared by the [place] FDA and  has been authorized for detection and/or diagnosis of SARS-CoV-2 by FDA under an Emergency Use Authorization (EUA). This EUA will remain  in effect (meaning this test can be used) for the duration of the COVID-19 declaration under Section 564(b)(1) of the Act, 21 U.                          S.C. section 360bbb-3(b)(1), unless the authorization is terminated or revoked sooner.   Performed at [facility], [address], [place], [place] [address]   Admission on 07/12/2022, Discharged on 07/17/2022  Component Date Value Ref Range Status   SARS Coronavirus 2 by RT PCR 07/12/2022 NEGATIVE  NEGATIVE Final   Comment: (NOTE) SARS-CoV-2 target nucleic acids are NOT DETECTED.  The SARS-CoV-2 RNA is generally detectable in upper and lower respiratory specimens during the acute phase of infection. The lowest concentration of SARS-CoV-2 viral copies this assay can detect is 250 copies / mL. A negative result does not preclude SARS-CoV-2 infection and should not be used as the sole basis for treatment or other patient management decisions.  A negative result may occur with improper specimen collection / handling, submission of specimen  other than nasopharyngeal swab, presence of viral mutation(s) within the areas targeted by this assay, and inadequate number of viral copies (<250 copies / mL). A negative result must be combined with clinical observations, patient history, and epidemiological information.  Fact Sheet for Patients:   RoadLapTop.co.za  Fact Sheet for Healthcare Providers: http://kim-miller.com/  This test is not yet approved or                           cleared by the [place] FDA and has been authorized for detection and/or diagnosis of SARS-CoV-2 by FDA under an Emergency Use Authorization (EUA).  This EUA will remain in effect (meaning this test can be used) for the duration of the COVID-19 declaration under Section 564(b)(1) of the Act, 21 U.S.C. section 360bbb-3(b)(1), unless the authorization is terminated or revoked sooner.  Performed at [facility], [address] [place], [place]  [address]    WBC 07/13/2022 7.2  4.0 - 10.5 K/uL Final   RBC 07/13/2022 4.72  3.87 - 5.11 MIL/uL Final   Hemoglobin 07/13/2022 14.4  12.0 - 15.0 g/dL Final   HCT 60/45/4098 44.1  36.0 - 46.0 % Final   MCV 07/13/2022 93.4  80.0 - 100.0 fL Final   MCH 07/13/2022 30.5  26.0 - 34.0 pg Final   MCHC 07/13/2022 32.7  30.0 - 36.0 g/dL Final   RDW 11/91/4782 13.2  11.5 - 15.5 % Final   Platelets 07/13/2022 189  150 - 400 K/uL Final   nRBC 07/13/2022 0.0  0.0 - 0.2 % Final   Performed at [facility], [address] [place], [place] [address]   Hgb A1c MFr Bld 07/13/2022 5.4  4.8 - 5.6 % Final   Comment: (NOTE)         Prediabetes: 5.7 - 6.4         Diabetes: >6.4         Glycemic control for adults with diabetes: <7.0    Mean Plasma Glucose 07/13/2022 108  mg/dL Final   Comment: (NOTE) Performed At: [facility] [address] [place], [place] [id number] Jolene Schimke MD NG:[id number]    Cholesterol 07/13/2022 206  (H)  0 - 200 mg/dL Final   Triglycerides 40/05/2724 60  <150 mg/dL Final   HDL 36/64/4034 65  >40 mg/dL Final   Total CHOL/HDL Ratio 07/13/2022 3.2  RATIO Final   VLDL 07/13/2022 12  0 - 40 mg/dL Final   LDL Cholesterol 07/13/2022 129 (H)  0 - 99 mg/dL Final   Comment:        Total Cholesterol/HDL:CHD Risk Coronary Heart Disease Risk Table                     Men   Women  1/2 Average Risk   3.4   3.3  Average Risk       5.0   4.4  2 X Average Risk   9.6   7.1  3 X Average Risk  23.4   11.0        Use the calculated Patient Ratio above and the CHD Risk Table to determine the patient's CHD Risk.        ATP III CLASSIFICATION (LDL):  <100     mg/dL   Optimal  742-595  mg/dL   Near or Above                    Optimal  130-159  mg/dL   Borderline  638-756  mg/dL   High  >433     mg/dL   Very High Performed at [facility], [address] [place], [place] [address]    TSH 07/13/2022 2.213  0.350 - 4.500 uIU/mL Final   Comment: Performed by a 3rd Generation assay with a functional sensitivity of <=0.01 uIU/mL. Performed at [facility], [address] [place], [place] [address]    Color, Urine 07/13/2022 YELLOW  YELLOW Final   APPearance 07/13/2022 TURBID (A)  CLEAR Final   Specific Gravity, Urine 07/13/2022 1.025  1.005 - 1.030 Final   pH 07/13/2022 5.0  5.0 - 8.0 Final   Glucose, UA 07/13/2022 NEGATIVE  NEGATIVE mg/dL Final   Hgb urine dipstick 07/13/2022 NEGATIVE  NEGATIVE Final   Bilirubin Urine 07/13/2022 NEGATIVE  NEGATIVE Final   Ketones, ur 07/13/2022 5 (A)  NEGATIVE mg/dL Final  Protein, ur 07/13/2022 30 (A)  NEGATIVE mg/dL Final   Nitrite 07/13/2022 NEGATIVE  NEGATIVE Final   Leukocytes,Ua 07/13/2022 MODERATE (A)  NEGATIVE Final   RBC / HPF 07/13/2022 11-20  0 - 5 RBC/hpf Final   WBC, UA 07/13/2022 >50 (H)  0 - 5 WBC/hpf Final   Bacteria, UA 07/13/2022 MANY (A)  NONE SEEN Final   Squamous Epithelial / HPF 07/13/2022 >50 (H)  0  - 5 Final   Mucus 07/13/2022 PRESENT   Final   Performed at [facility], [place] [address] [place], [place] [address]   Sodium 07/14/2022 140  135 - 145 mmol/L Final   Potassium 07/14/2022 3.8  3.5 - 5.1 mmol/L Final   Chloride 07/14/2022 105  98 - 111 mmol/L Final   CO2 07/14/2022 27  22 - 32 mmol/L Final   Glucose, Bld 07/14/2022 91  70 - 99 mg/dL Final   Glucose reference range applies only to samples taken after fasting for at least 8 hours.   BUN 07/14/2022 11  6 - 20 mg/dL Final   Creatinine, Ser 07/14/2022 0.80  0.44 - 1.00 mg/dL Final   Calcium 07/14/2022 9.4  8.9 - 10.3 mg/dL Final   Total Protein 07/14/2022 7.1  6.5 - 8.1 g/dL Final   Albumin 07/14/2022 4.1  3.5 - 5.0 g/dL Final   AST 07/14/2022 25  15 - 41 U/L Final   ALT 07/14/2022 34  0 - 44 U/L Final   Alkaline Phosphatase 07/14/2022 66  38 - 126 U/L Final   Total Bilirubin 07/14/2022 0.6  0.3 - 1.2 mg/dL Final   GFR, Estimated 07/14/2022 >60  >60 mL/min Final   Comment: (NOTE) Calculated using the CKD-EPI Creatinine Equation (2021)    Anion gap 07/14/2022 8  5 - 15 Final   Performed at [facility], [place] [address] [place], [place] [address]   Preg Test, Ur 07/13/2022 NEGATIVE  NEGATIVE Final   Comment:        THE SENSITIVITY OF THIS METHODOLOGY IS >20 mIU/mL. Performed at [facility], [place] [address], [place], [place] [address]    hCG, Ollen Barges, Quant, Chauncey Cruel 07/14/2022 <1  <5 mIU/mL Final   Comment:          GEST. AGE      CONC.  (mIU/mL)   <=1 WEEK        5 - 50     2 WEEKS       50 - 500     3 WEEKS       100 - 10,000     4 WEEKS     1,000 - 30,000     5 WEEKS     3,500 - 115,000   6-8 WEEKS     12,000 - 270,000    12 WEEKS     15,000 - 220,000        FEMALE AND NON-PREGNANT FEMALE:     LESS THAN 5 mIU/mL Performed at [facility], [place] [address] [place], [place] [address]    Color, Urine 07/14/2022 YELLOW  YELLOW Final   APPearance  07/14/2022 CLOUDY (A)  CLEAR Final   Specific Gravity, Urine 07/14/2022 1.021  1.005 - 1.030 Final   pH 07/14/2022 5.0  5.0 - 8.0 Final   Glucose, UA 07/14/2022 NEGATIVE  NEGATIVE mg/dL Final   Hgb urine dipstick 07/14/2022 NEGATIVE  NEGATIVE Final   Bilirubin Urine 07/14/2022 NEGATIVE  NEGATIVE Final  Ketones, ur 07/14/2022 NEGATIVE  NEGATIVE mg/dL Final   Protein, ur 07/14/2022 NEGATIVE  NEGATIVE mg/dL Final   Nitrite 07/14/2022 NEGATIVE  NEGATIVE Final   Leukocytes,Ua 07/14/2022 LARGE (A)  NEGATIVE Final   RBC / HPF 07/14/2022 6-10  0 - 5 RBC/hpf Final   WBC, UA 07/14/2022 >50 (H)  0 - 5 WBC/hpf Final   Bacteria, UA 07/14/2022 MANY (A)  NONE SEEN Final   Squamous Epithelial / HPF 07/14/2022 >50 (H)  0 - 5 Final   Performed at [facility], [place] [address], [place], [place] [address]  Postpartum Visit on 07/09/2022  Component Date Value Ref Range Status   Neisseria Gonorrhea 07/09/2022 Negative   Final   Chlamydia 07/09/2022 Negative   Final   Trichomonas 07/09/2022 Negative   Final   Bacterial Vaginitis (gardnerella) 07/09/2022 Positive (A)   Final   Candida Vaginitis 07/09/2022 Negative   Final   Candida Glabrata 07/09/2022 Negative   Final   Comment 07/09/2022 Normal Reference Range Bacterial Vaginosis - Negative   Final   Comment 07/09/2022 Normal Reference Range Candida Species - Negative   Final   Comment 07/09/2022 Normal Reference Range Candida Galbrata - Negative   Final   Comment 07/09/2022 Normal Reference Range Trichomonas - Negative   Final   Comment 07/09/2022 Normal Reference Ranger Chlamydia - Negative   Final   Comment 07/09/2022 Normal Reference Range Neisseria Gonorrhea - Negative   Final   Preg Test, Ur 07/09/2022 NEGATIVE  NEGATIVE Final   Comment:        THE SENSITIVITY OF THIS METHODOLOGY IS >24 mIU/mL   Admission on 05/28/2022, Discharged on 05/31/2022  Component Date Value Ref Range Status   Color, Urine 05/28/2022 YELLOW  YELLOW Final    APPearance 05/28/2022 CLOUDY (A)  CLEAR Final   Specific Gravity, Urine 05/28/2022 1.008  1.005 - 1.030 Final   pH 05/28/2022 6.0  5.0 - 8.0 Final   Glucose, UA 05/28/2022 NEGATIVE  NEGATIVE mg/dL Final   Hgb urine dipstick 05/28/2022 NEGATIVE  NEGATIVE Final   Bilirubin Urine 05/28/2022 NEGATIVE  NEGATIVE Final   Ketones, ur 05/28/2022 5 (A)  NEGATIVE mg/dL Final   Protein, ur 05/28/2022 NEGATIVE  NEGATIVE mg/dL Final   Nitrite 05/28/2022 NEGATIVE  NEGATIVE Final   Leukocytes,Ua 05/28/2022 TRACE (A)  NEGATIVE Final   RBC / HPF 05/28/2022 0-5  0 - 5 RBC/hpf Final   WBC, UA 05/28/2022 6-10  0 - 5 WBC/hpf Final   Bacteria, UA 05/28/2022 MANY (A)  NONE SEEN Final   Squamous Epithelial / HPF 05/28/2022 21-50  0 - 5 Final   Performed at [facility], [address], [place], [place] [address]   Sodium 05/28/2022 140  135 - 145 mmol/L Final   Potassium 05/28/2022 3.7  3.5 - 5.1 mmol/L Final   Chloride 05/28/2022 104  98 - 111 mmol/L Final   CO2 05/28/2022 22  22 - 32 mmol/L Final   Glucose, Bld 05/28/2022 109 (H)  70 - 99 mg/dL Final   Glucose reference range applies only to samples taken after fasting for at least 8 hours.   BUN 05/28/2022 5 (L)  6 - 20 mg/dL Final   Creatinine, Ser 05/28/2022 0.55  0.44 - 1.00 mg/dL Final   Calcium 05/28/2022 9.8  8.9 - 10.3 mg/dL Final   Total Protein 05/28/2022 6.3 (L)  6.5 - 8.1 g/dL Final   Albumin 05/28/2022 3.1 (L)  3.5 - 5.0 g/dL Final  AST 05/28/2022 21  15 - 41 U/L Final   ALT 05/28/2022 14  0 - 44 U/L Final   Alkaline Phosphatase 05/28/2022 151 (H)  38 - 126 U/L Final   Total Bilirubin 05/28/2022 0.4  0.3 - 1.2 mg/dL Final   GFR, Estimated 05/28/2022 >60  >60 mL/min Final   Comment: (NOTE) Calculated using the CKD-EPI Creatinine Equation (2021)    Anion gap 05/28/2022 14  5 - 15 Final   Performed at [facility], [address], [place], [place] [address]   Creatinine, Urine 05/28/2022 47  mg/dL Final   Total Protein, Urine  05/28/2022 7  mg/dL Final   NO NORMAL RANGE ESTABLISHED FOR THIS TEST   Protein Creatinine Ratio 05/28/2022 0.15  0.00 - 0.15 mg/mg[Cre] Final   Performed at [facility], [address], [place], [place] [address]   WBC 05/28/2022 10.0  4.0 - 10.5 K/uL Final   RBC 05/28/2022 4.47  3.87 - 5.11 MIL/uL Final   Hemoglobin 05/28/2022 13.8  12.0 - 15.0 g/dL Final   HCT 09/81/1914 40.4  36.0 - 46.0 % Final   MCV 05/28/2022 90.4  80.0 - 100.0 fL Final   MCH 05/28/2022 30.9  26.0 - 34.0 pg Final   MCHC 05/28/2022 34.2  30.0 - 36.0 g/dL Final   RDW 78/29/5621 14.0  11.5 - 15.5 % Final   Platelets 05/28/2022 198  150 - 400 K/uL Final   nRBC 05/28/2022 0.0  0.0 - 0.2 % Final   Performed at [facility], [address], [place], [place] [address]   ABO/RH(D) 05/28/2022 O POS   Final   Antibody Screen 05/28/2022 NEG   Final   Sample Expiration 05/28/2022    Final                   Value:05/31/2022,2359 Performed at [facility], [address], [place], [place] [address]    RPR Ser Ql 05/28/2022 NON REACTIVE  NON REACTIVE Final   Performed at [facility], [address], [place], [place] [address]   WBC 05/29/2022 8.3  4.0 - 10.5 K/uL Final   RBC 05/29/2022 4.19  3.87 - 5.11 MIL/uL Final   Hemoglobin 05/29/2022 13.2  12.0 - 15.0 g/dL Final   HCT 84/13/2440 38.3  36.0 - 46.0 % Final   MCV 05/29/2022 91.4  80.0 - 100.0 fL Final   MCH 05/29/2022 31.5  26.0 - 34.0 pg Final   MCHC 05/29/2022 34.5  30.0 - 36.0 g/dL Final   RDW 06/05/2535 14.3  11.5 - 15.5 % Final   Platelets 05/29/2022 179  150 - 400 K/uL Final   nRBC 05/29/2022 0.0  0.0 - 0.2 % Final   Performed at [facility], [address] [place], [place] [address]   WBC 05/29/2022 10.1  4.0 - 10.5 K/uL Final   RBC 05/29/2022 4.40  3.87 - 5.11 MIL/uL Final   Hemoglobin 05/29/2022 13.8  12.0 - 15.0 g/dL Final   HCT 47/42/5956 40.8  36.0 - 46.0 % Final   MCV 05/29/2022 92.7  80.0 - 100.0 fL Final   MCH  05/29/2022 31.4  26.0 - 34.0 pg Final   MCHC 05/29/2022 33.8  30.0 - 36.0 g/dL Final   RDW 38/75/6433 14.2  11.5 - 15.5 % Final   Platelets 05/29/2022 201  150 - 400 K/uL Final   nRBC 05/29/2022 0.0  0.0 - 0.2 % Final  Performed at [facility], [address] [place], [place] [address]   WBC 05/30/2022 11.2 (H)  4.0 - 10.5 K/uL Final   RBC 05/30/2022 4.29  3.87 - 5.11 MIL/uL Final   Hemoglobin 05/30/2022 13.4  12.0 - 15.0 g/dL Final   HCT 60/45/409810/22/2023 39.5  36.0 - 46.0 % Final   MCV 05/30/2022 92.1  80.0 - 100.0 fL Final   MCH 05/30/2022 31.2  26.0 - 34.0 pg Final   MCHC 05/30/2022 33.9  30.0 - 36.0 g/dL Final   RDW 11/91/478210/22/2023 14.3  11.5 - 15.5 % Final   Platelets 05/30/2022 193  150 - 400 K/uL Final   nRBC 05/30/2022 0.0  0.0 - 0.2 % Final   Performed at [facility], [address] [place], [place] [address]  Admission on 05/23/2022, Discharged on 05/24/2022  Component Date Value Ref Range Status   POCT [place] Test 05/23/2022 Negative = intact amniotic membranes   Final   Color, Urine 05/23/2022 YELLOW  YELLOW Final   APPearance 05/23/2022 HAZY (A)  CLEAR Final   Specific Gravity, Urine 05/23/2022 1.008  1.005 - 1.030 Final   pH 05/23/2022 6.0  5.0 - 8.0 Final   Glucose, UA 05/23/2022 NEGATIVE  NEGATIVE mg/dL Final   Hgb urine dipstick 05/23/2022 NEGATIVE  NEGATIVE Final   Bilirubin Urine 05/23/2022 NEGATIVE  NEGATIVE Final   Ketones, ur 05/23/2022 NEGATIVE  NEGATIVE mg/dL Final   Protein, ur 30/86/578410/15/2023 NEGATIVE  NEGATIVE mg/dL Final   Nitrite 69/62/952810/15/2023 NEGATIVE  NEGATIVE Final   Leukocytes,Ua 05/23/2022 NEGATIVE  NEGATIVE Final   Performed at [facility], [address] [place], [place] [address]   POCT [place] Test 05/23/2022 Negative = intact amniotic membranes   Final   WBC 05/24/2022 8.6  4.0 - 10.5 K/uL Final   RBC 05/24/2022 4.37  3.87 - 5.11 MIL/uL Final   Hemoglobin 05/24/2022 13.5  12.0 - 15.0 g/dL Final   HCT 40/10/272510/16/2023 39.3  36.0 - 46.0 % Final    MCV 05/24/2022 89.9  80.0 - 100.0 fL Final   MCH 05/24/2022 30.9  26.0 - 34.0 pg Final   MCHC 05/24/2022 34.4  30.0 - 36.0 g/dL Final   RDW 36/64/403410/16/2023 14.0  11.5 - 15.5 % Final   Platelets 05/24/2022 187  150 - 400 K/uL Final   nRBC 05/24/2022 0.0  0.0 - 0.2 % Final   Performed at [facility], [address] [place], [place] [address]   Sodium 05/24/2022 136  135 - 145 mmol/L Final   Potassium 05/24/2022 3.5  3.5 - 5.1 mmol/L Final   Chloride 05/24/2022 107  98 - 111 mmol/L Final   CO2 05/24/2022 20 (L)  22 - 32 mmol/L Final   Glucose, Bld 05/24/2022 107 (H)  70 - 99 mg/dL Final   Glucose reference range applies only to samples taken after fasting for at least 8 hours.   BUN 05/24/2022 <5 (L)  6 - 20 mg/dL Final   Creatinine, Ser 05/24/2022 0.48  0.44 - 1.00 mg/dL Final   Calcium 56/38/756410/16/2023 9.0  8.9 - 10.3 mg/dL Final   Total Protein 33/29/518810/16/2023 6.1 (L)  6.5 - 8.1 g/dL Final   Albumin 41/66/063010/16/2023 2.9 (L)  3.5 - 5.0 g/dL Final   AST 16/01/093210/16/2023 19  15 - 41 U/L Final   ALT 05/24/2022 14  0 - 44 U/L Final   Alkaline Phosphatase 05/24/2022 137 (H)  38 - 126 U/L Final   Total  Bilirubin 05/24/2022 0.3  0.3 - 1.2 mg/dL Final   GFR, Estimated 05/24/2022 >60  >60 mL/min Final   Comment: (NOTE) Calculated using the CKD-EPI Creatinine Equation (2021)    Anion gap 05/24/2022 9  5 - 15 Final   Performed at [facility], [address], [place], [place] [address]   Creatinine, Urine 05/23/2022 48  mg/dL Final   Total Protein, Urine 05/23/2022 6  mg/dL Final   NO NORMAL RANGE ESTABLISHED FOR THIS TEST   Protein Creatinine Ratio 05/23/2022 0.13  0.00 - 0.15 mg/mg[Cre] Final   Performed at [facility], [address] [place], [place] [address]  There may be more visits with results that are not included.    Allergies: Ascorbate, Citrus, Coconut flavor, Lamotrigine, Orange (diagnostic), Peach flavor, Pear, and Pineapple  PTA Medications:  No current facility-administered  medications on file prior to encounter.   Current Outpatient Medications on File Prior to Encounter  Medication Sig Dispense Refill   ARIPiprazole (ABILIFY) 10 MG tablet Take 1 tablet (10 mg total) by mouth at bedtime. 30 tablet 0   nicotine (NICODERM CQ - DOSED IN MG/24 HOURS) 14 mg/24hr patch Place 1 patch (14 mg total) onto the skin daily. 28 patch 0     Medical Decision Making  Ruhama Lehew is a 27 y/o female with a history of Bipolar Disorder and PTSD presenting to [facility] for suicidal ideations with thoughts of taking pills to overdose.     Recommendations  Based on my evaluation the patient does not appear to have an emergency medical condition. Patient will be admitted to [facility] for continuous assessment.  Jasper Riling, NP 08/21/22  6:59 AM

## 2022-08-21 NOTE — ED Notes (Signed)
Pt is awake and alert.  States that she is " feeling better" but asked " if I am to go to a hospital, I only want to go to [place]".  Pt currently denies SI, HI or AVH.  She was instructed that the provider will be with her shortly.

## 2022-08-21 NOTE — ED Provider Notes (Signed)
FBC/OBS ASAP Discharge Summary  Date and Time: 08/21/2022 12:50 PM  Name: Sharon Ward  MRN:  [id number]   Discharge Diagnoses:  Final diagnoses:  Bipolar II disorder ([place])  Suicidal ideation    Subjective: Patient seen and evaluated face-to-face by this provider, chart reviewed and case discussed with Dr. Lovette Cliche. On evaluation, patient is alert and oriented x 4. Her thought process is linear and speech is clear and coherent at a moderate tone. Her mood is euthymic and affect is congruent.  Patient is requesting to be discharged. She states that she feels a lot better today. She denies suicidal ideations. She denies homicidal ideations. She denies auditory or visual hallucinations. There is no objective evidence that the patient is currently responding to internal or external stimuli. She states that sometimes she feels like her medications are not working and she experiences mood swings of anger, irritability, and sadness. She states that she is followed by the ACT team and they come out to her house at least 2 or 3 times per week. She states that she asked the psychiatrist to increase or change the Abilify because she feels like it is not effective.  However, the psychiatrist refuses to change her medication. She states that she plans to follow-up with [facility] for medication management. She states that she does not have any medications in her home because the ACT team has removed all medications. She denies access to weapons in the home. She states that she gets weekend custody visits with her 63- year old daughter and she's scheduled for a custody visit today. She identifies her 81-month-old child's father as her support person. She gave verbal consent for this provider to contact her child's father Veatrice Bourbon [phone]. I contacted Mr. Sherlynn Carbon via phone, no answer, left HIPAA voicemail. I contacted [facility] and spoke to [facility], peer support specialist who  states that the earliest home visit would be on Tuesday at 11 AM. Follow-up appointment with the ACT team was conveyed to the patient. She verbalizes understanding. She requested a bus pass for transportation home.  Stay Summary:  Per TTS note, Sharon Ward is a 27 year old female presenting voluntary to [facility] due to [facility] with plan to overdose. Patient denied HI, psychosis and alcohol/drug usage. Patient was inpatient at [facility] from 07/30/22-08/05/22 after attempted overdose.   Patient reported having a manic episode earlier today. Patient reported during manic episode that she was feeling suicidal and wanting to overdose which was approx 4 hours ago. Patient reported she is no longer having suicidal thoughts. Patient reported current stressors include, her 2 year daughter being in [place] custody, her continued manic episodes, not having proper ACT Team and not being with 57 month old baby. Patient currently has weekend visits with 25 year old daughter and day visits and video calls with 82 month old daughter. Patient reported worsening depressive symptoms. Patient reported insomnia and hasn't slept in 2 days. Patient reported poor appetite.  Patient was admitted to the [facility] behavioral health continuous assessment unit for overnight observation. Patient observed on the unit without any aggressive, disruptive, psychotic, manic, or self-harm behaviors.   Total Time spent with patient: 30 minutes  Past Psychiatric History: a history of Bipolar Disorder, Borderline Personality Disorder, GAD and PTSD  Past Medical History:  Past Medical History:  Diagnosis Date   Asthma    Bipolar 1 disorder, mixed ([place])    Depression    Generalized anxiety disorder  Intentional drug overdose ([place]) 11/03/2020   Low-lying placenta 01/07/2022   Resolved 03/04/22   Relationship dysfunction    Suicide attempt ([place]) 11/02/2020    Past Surgical History:  Procedure Laterality  Date   PILONIDAL CYST / SINUS EXCISION  09/11/2013   PILONIDAL CYST EXCISION  05/17/2014   Pilonidal cystectomy with cleft lip   Family History:  Family History  Problem Relation Age of Onset   Asthma Mother    Diabetes Mother    Healthy Mother    Hypertension Father    Asthma Father    Diabetes Father    Healthy Father    Asthma Brother    Hypertension Paternal Uncle    Diabetes Paternal Grandmother    Stroke Paternal Grandfather    Heart disease Paternal Grandfather    Hypertension Paternal Grandfather    Diabetes Paternal Grandfather    Family Psychiatric History: No history reported. Social History:  Social History   Substance and Sexual Activity  Alcohol Use Not Currently   Comment: occasional prior to pregnancy     Social History   Substance and Sexual Activity  Drug Use Not Currently   Frequency: 4.0 times per week   Types: Marijuana   Comment: No Delta 9 since February 2023    Social History   Socioeconomic History   Marital status: Single    Spouse name: Not on file   Number of children: 1   Years of education: 10   Highest education level: 10th grade  Occupational History   Not on file  Tobacco Use   Smoking status: Former    Packs/day: 1.00    Years: 15.00    Total pack years: 15.00    Types: Cigarettes    Quit date: 07/14/2021    Years since quitting: 1.1   Smokeless tobacco: Never   Tobacco comments:    only smoke a "couple" of cigarettes when stressed or anxious, socially with friends per [facility] chart  Vaping Use   Vaping Use: Former   Start date: 08/10/2003   Quit date: 05/04/2021  Substance and Sexual Activity   Alcohol use: Not Currently    Comment: occasional prior to pregnancy   Drug use: Not Currently    Frequency: 4.0 times per week    Types: Marijuana    Comment: No Delta 9 since February 2023   Sexual activity: Not Currently    Partners: Male    Birth control/protection: None  Other Topics Concern   Not on file  Social  History Narrative   Not on file   Social Determinants of Health   Financial Resource Strain: Not on file  Food Insecurity: No Food Insecurity (07/30/2022)   Hunger Vital Sign    Worried About [place] in the Last Year: Never true    [place] in the Last Year: Never true  Recent Concern: Food Insecurity - Food Insecurity Present (05/29/2022)   Hunger Vital Sign    Worried About [place] in the Last Year: Sometimes true    Ran Out of Food in the Last Year: Never true  Transportation Needs: No Transportation Needs (07/30/2022)   PRAPARE - Hydrologist (Medical): No    Lack of Transportation (Non-Medical): No  Physical Activity: Not on file  Stress: Not on file  Social Connections: Not on file   SDOH:  [place]: No Food Insecurity (07/30/2022)  Recent Concern: Food Insecurity -  Food Insecurity Present (05/29/2022)  Housing: Low Risk  (07/30/2022)  Transportation Needs: No Transportation Needs (07/30/2022)  Utilities: Not At Risk (07/30/2022)  Alcohol Screen: Low Risk  (07/30/2022)  Depression (PHQ2-9): Medium Risk (08/21/2022)  Tobacco Use: Medium Risk (08/18/2022)    Tobacco Cessation:  N/A, patient does not currently use tobacco products  Current Medications:  Current Facility-Administered Medications  Medication Dose Route Frequency Provider Last Rate Last Admin   acetaminophen (TYLENOL) tablet 650 mg  650 mg Oral Q6H PRN Bobbitt, Shalon E, NP       alum & mag hydroxide-simeth (MAALOX/MYLANTA) 200-200-20 MG/5ML suspension 30 mL  30 mL Oral Q4H PRN Bobbitt, Shalon E, NP       hydrOXYzine (ATARAX) tablet 25 mg  25 mg Oral TID PRN Bobbitt, Shalon E, NP       magnesium hydroxide (MILK OF MAGNESIA) suspension 30 mL  30 mL Oral Daily PRN Bobbitt, Shalon E, NP       Current Outpatient Medications  Medication Sig Dispense Refill   ARIPiprazole (ABILIFY) 10 MG tablet Take 1 tablet (10 mg total) by  mouth at bedtime. 30 tablet 0    PTA Medications: (Not in a hospital admission)      08/21/2022   12:43 AM 08/18/2022    3:46 PM 05/26/2022    3:44 PM  Depression screen PHQ 2/9  Decreased Interest 2 2 0  Down, Depressed, Hopeless 2 2 0  PHQ - 2 Score 4 4 0  Altered sleeping 1 3 1   Tired, decreased energy 1 2 1   Change in appetite 1 3 1   Feeling bad or failure about yourself  1 3 0  Trouble concentrating 1 0 0  Moving slowly or fidgety/restless 0 0 0  Suicidal thoughts 1 0 0  PHQ-9 Score 10 15 3   Difficult doing work/chores Very difficult      Flowsheet Row ED from 08/20/2022 in [facility]Dis[facility]) [facility] from [facility]21/2022 in [facility] RISK CATEGORY High Risk No Risk No Risk       Musculoskeletal  Strength & Muscle Tone: within normal limits Gait & Station: normal Patient leans: N/A  Psychiatric Specialty Exam  Presentation  General Appearance:  Appropriate for Environment  Eye Contact: Fair  Speech: Clear and Coherent  Speech Volume: Normal  Handedness: Right   Mood and Affect  Mood: Euthymic  Affect: Congruent   Thought Process  Thought Processes: Coherent  Descriptions of Associations:Intact  Orientation:Full (Time, Place and Person)  Thought Content:Logical  Diagnosis of Schizophrenia or Schizoaffective disorder in past: No    Hallucinations:Hallucinations: None  Ideas of Reference:None  Suicidal Thoughts:Suicidal Thoughts: No SI Active Intent and/or Plan: With Intent; With Plan  Homicidal Thoughts:Homicidal Thoughts: No   Sensorium  Memory: Immediate Fair; Recent Fair; Remote Fair  Judgment: Fair  Insight: Fair   08/01/2022: Fair  Attention Span: Fair  Recall: 07/23/2022 of Knowledge: Fair  Language: Fair   Psychomotor Activity  Psychomotor  Activity: Psychomotor Activity: Normal   Assets  Assets: [facility]; Desire for Improvement; Financial Resources/Insurance; Housing; Leisure Time; Physical Health; Social Support   Sleep  Sleep: Sleep: Fair Number of Hours of Sleep: 8   Nutritional Assessment (For OBS and FBC admissions only) Has the patient had a weight loss or gain of 10 pounds or more in the last 3 months?: No Has the patient  had a decrease in food intake/or appetite?: Yes Does the patient have dental problems?: No Does the patient have eating habits or behaviors that may be indicators of an eating disorder including binging or inducing vomiting?: Yes Has the patient recently lost weight without trying?: 0 Has the patient been eating poorly because of a decreased appetite?: 1 Malnutrition Screening Tool Score: 1    Physical Exam  Physical Exam HENT:     Nose: Nose normal.  Cardiovascular:     Rate and Rhythm: Normal rate.  Pulmonary:     Effort: Pulmonary effort is normal.  Musculoskeletal:        General: Normal range of motion.     Cervical back: Normal range of motion.  Neurological:     Mental Status: She is alert and oriented to person, place, and time.    Review of Systems  Constitutional: Negative.   HENT: Negative.    Eyes: Negative.   Respiratory: Negative.    Cardiovascular: Negative.   Gastrointestinal: Negative.   Genitourinary: Negative.   Musculoskeletal: Negative.   Neurological: Negative.   Endo/Heme/Allergies: Negative.    Blood pressure (!) 99/51, pulse 66, temperature 99.3 F (37.4 C), temperature source Oral, resp. rate 18, SpO2 98 %, not currently breastfeeding. There is no height or weight on file to calculate BMI.  Demographic Factors:  Adolescent or young adult, Caucasian, Low socioeconomic status, and Living alone  Loss Factors: Legal issues and Financial problems/change in socioeconomic status  Historical Factors: Impulsivity  Risk Reduction  Factors:   Responsible for children under 15 years of age, Sense of responsibility to family, and Positive social support  Continued Clinical Symptoms:  Previous Psychiatric Diagnoses and Treatments  Cognitive Features That Contribute To Risk:  None    Suicide Risk:  Minimal: No identifiable suicidal ideation.  Patients presenting with no risk factors but with morbid ruminations; may be classified as minimal risk based on the severity of the depressive symptoms  Plan Of Care/Follow-up recommendations:   Medications: Patient is to take medications as prescribed. No medication changes were made during your visit. The patient or patient's guardian is to contact a medical professional and/or outpatient provider to address any new side effects that develop. The patient or the patient's guardian should update outpatient providers of any new medications and/or medication changes.   Outpatient Follow up: Please follow up with [facility] for medication management and peer support. Alyse Low, peer support specialist or Abby, therapist will follow up on Tuesday, 08/24/22, around 11:00 am for a home follow up visit.    Therapy: We recommend that patient participate in individual therapy to address mental health concerns.  Safety:   The following safety precautions should be taken:   No sharp objects. This includes scissors, razors, scrapers, and putty knives.   Chemicals should be removed and locked up.   Medications should be removed and locked up.   Weapons should be removed and locked up. This includes firearms, knives and instruments that can be used to cause injury.   The patient should abstain from use of illicit substances/drugs and abuse of any medications.  If symptoms worsen or do not continue to improve or if the patient becomes actively suicidal or homicidal then it is recommended that the patient return to the closest hospital emergency department, the [facility], or call 911 for further evaluation and treatment. National Suicide Prevention Lifeline 1-800-SUICIDE or (850)095-3304.  About 988 988 offers 24/7 access to trained crisis  counselors who can help people experiencing mental health-related distress. People can call or text 988 or chat 988lifeline.org for themselves or if they are worried about a loved one who may need crisis support.    Disposition: Discharge.   Marissa Calamity, NP 08/21/2022, 12:50 PM

## 2022-08-21 NOTE — Discharge Instructions (Addendum)
Discharge recommendations:   Medications: Patient is to take medications as prescribed. No medication changes were made during your visit. The patient or patient's guardian is to contact a medical professional and/or outpatient provider to address any new side effects that develop. The patient or the patient's guardian should update outpatient providers of any new medications and/or medication changes.   Outpatient Follow up: Please follow up with [facility] for medication management and peer support. Alyse Low, peer support specialist or Abby, therapist will follow up on Tuesday, 08/24/22, around 11:00 am for a home follow up visit.    Therapy: We recommend that patient participate in individual therapy to address mental health concerns.  Safety:   The following safety precautions should be taken:   No sharp objects. This includes scissors, razors, scrapers, and putty knives.   Chemicals should be removed and locked up.   Medications should be removed and locked up.   Weapons should be removed and locked up. This includes firearms, knives and instruments that can be used to cause injury.   The patient should abstain from use of illicit substances/drugs and abuse of any medications.  If symptoms worsen or do not continue to improve or if the patient becomes actively suicidal or homicidal then it is recommended that the patient return to the closest hospital emergency department, the [facility], or call 911 for further evaluation and treatment. National Suicide Prevention Lifeline 1-800-SUICIDE or (930)469-7216.  About 988 988 offers 24/7 access to trained crisis counselors who can help people experiencing mental health-related distress. People can call or text 988 or chat 988lifeline.org for themselves or if they are worried about a loved one who may need crisis support.

## 2022-08-30 ENCOUNTER — Emergency Department (EMERGENCY_DEPARTMENT_HOSPITAL)
Admission: EM | Admit: 2022-08-30 | Discharge: 2022-08-30 | Disposition: A | Discharge status: Other Acute Inpatient Hospital | Payer: Medicaid Other | Source: Home / Self Care | Attending: Emergency Medicine | Admitting: Emergency Medicine

## 2022-08-30 ENCOUNTER — Inpatient Hospital Stay (HOSPITAL_COMMUNITY)
Admission: AD | Admit: 2022-08-30 | Discharge: 2022-09-03 | DRG: 885 | Disposition: A | Discharge status: Home / Self Care | Payer: Medicaid Other | Source: Intra-hospital | Attending: Psychiatry | Admitting: Psychiatry

## 2022-08-30 ENCOUNTER — Encounter (HOSPITAL_COMMUNITY): Payer: Self-pay | Admitting: Emergency Medicine

## 2022-08-30 DIAGNOSIS — Z62810 Personal history of physical and sexual abuse in childhood: Secondary | ICD-10-CM | POA: Diagnosis not present

## 2022-08-30 DIAGNOSIS — G4700 Insomnia, unspecified: Secondary | ICD-10-CM | POA: Diagnosis present

## 2022-08-30 DIAGNOSIS — Z91018 Allergy to other foods: Secondary | ICD-10-CM | POA: Diagnosis not present

## 2022-08-30 DIAGNOSIS — F319 Bipolar disorder, unspecified: Secondary | ICD-10-CM | POA: Diagnosis present

## 2022-08-30 DIAGNOSIS — Z825 Family history of asthma and other chronic lower respiratory diseases: Secondary | ICD-10-CM

## 2022-08-30 DIAGNOSIS — R45850 Homicidal ideations: Secondary | ICD-10-CM | POA: Diagnosis present

## 2022-08-30 DIAGNOSIS — F411 Generalized anxiety disorder: Secondary | ICD-10-CM | POA: Diagnosis present

## 2022-08-30 DIAGNOSIS — F3130 Bipolar disorder, current episode depressed, mild or moderate severity, unspecified: Principal | ICD-10-CM | POA: Diagnosis present

## 2022-08-30 DIAGNOSIS — F3181 Bipolar II disorder: Secondary | ICD-10-CM | POA: Insufficient documentation

## 2022-08-30 DIAGNOSIS — R45851 Suicidal ideations: Secondary | ICD-10-CM | POA: Insufficient documentation

## 2022-08-30 DIAGNOSIS — Z9151 Personal history of suicidal behavior: Secondary | ICD-10-CM

## 2022-08-30 DIAGNOSIS — Z888 Allergy status to other drugs, medicaments and biological substances status: Secondary | ICD-10-CM | POA: Diagnosis not present

## 2022-08-30 DIAGNOSIS — Z1152 Encounter for screening for COVID-19: Secondary | ICD-10-CM | POA: Insufficient documentation

## 2022-08-30 DIAGNOSIS — Z833 Family history of diabetes mellitus: Secondary | ICD-10-CM | POA: Diagnosis not present

## 2022-08-30 DIAGNOSIS — Z818 Family history of other mental and behavioral disorders: Secondary | ICD-10-CM

## 2022-08-30 DIAGNOSIS — Z79899 Other long term (current) drug therapy: Secondary | ICD-10-CM

## 2022-08-30 DIAGNOSIS — J45909 Unspecified asthma, uncomplicated: Secondary | ICD-10-CM | POA: Insufficient documentation

## 2022-08-30 DIAGNOSIS — Z9102 Food additives allergy status: Secondary | ICD-10-CM

## 2022-08-30 DIAGNOSIS — F4310 Post-traumatic stress disorder, unspecified: Secondary | ICD-10-CM | POA: Diagnosis present

## 2022-08-30 DIAGNOSIS — Z823 Family history of stroke: Secondary | ICD-10-CM | POA: Diagnosis not present

## 2022-08-30 DIAGNOSIS — F6089 Other specific personality disorders: Secondary | ICD-10-CM | POA: Diagnosis present

## 2022-08-30 DIAGNOSIS — F314 Bipolar disorder, current episode depressed, severe, without psychotic features: Secondary | ICD-10-CM | POA: Diagnosis present

## 2022-08-30 DIAGNOSIS — Z8249 Family history of ischemic heart disease and other diseases of the circulatory system: Secondary | ICD-10-CM

## 2022-08-30 DIAGNOSIS — Z9152 Personal history of nonsuicidal self-harm: Secondary | ICD-10-CM | POA: Diagnosis not present

## 2022-08-30 DIAGNOSIS — I10 Essential (primary) hypertension: Secondary | ICD-10-CM | POA: Diagnosis present

## 2022-08-30 DIAGNOSIS — Z87891 Personal history of nicotine dependence: Secondary | ICD-10-CM | POA: Insufficient documentation

## 2022-08-30 DIAGNOSIS — F17210 Nicotine dependence, cigarettes, uncomplicated: Secondary | ICD-10-CM | POA: Diagnosis present

## 2022-08-30 LAB — RESP PANEL BY RT-PCR (RSV, FLU A&B, COVID)  RVPGX2
Influenza A by PCR: NEGATIVE
Influenza B by PCR: NEGATIVE
Resp Syncytial Virus by PCR: NEGATIVE
SARS Coronavirus 2 by RT PCR: NEGATIVE

## 2022-08-30 LAB — COMPREHENSIVE METABOLIC PANEL
ALT: 36 U/L (ref 0–44)
AST: 29 U/L (ref 15–41)
Albumin: 4.3 g/dL (ref 3.5–5.0)
Alkaline Phosphatase: 73 U/L (ref 38–126)
Anion gap: 7 (ref 5–15)
BUN: 12 mg/dL (ref 6–20)
CO2: 26 mmol/L (ref 22–32)
Calcium: 9.3 mg/dL (ref 8.9–10.3)
Chloride: 105 mmol/L (ref 98–111)
Creatinine, Ser: 0.75 mg/dL (ref 0.44–1.00)
GFR, Estimated: >60 mL/min (ref >60)
Glucose, Bld: 96 mg/dL (ref 70–99)
Potassium: 3.7 mmol/L (ref 3.5–5.1)
Sodium: 138 mmol/L (ref 135–145)
Total Bilirubin: 0.3 mg/dL (ref 0.3–1.2)
Total Protein: 7.8 g/dL (ref 6.5–8.1)

## 2022-08-30 LAB — RAPID URINE DRUG SCREEN, HOSP PERFORMED
Amphetamines: NOT DETECTED
Barbiturates: NOT DETECTED
Benzodiazepines: NOT DETECTED
Cocaine: NOT DETECTED
Opiates: NOT DETECTED
Tetrahydrocannabinol: NOT DETECTED

## 2022-08-30 LAB — CBC
HCT: 39.7 % (ref 36.0–46.0)
Hemoglobin: 13.2 g/dL (ref 12.0–15.0)
MCH: 30.7 pg (ref 26.0–34.0)
MCHC: 33.2 g/dL (ref 30.0–36.0)
MCV: 92.3 fL (ref 80.0–100.0)
Platelets: 269 10*3/uL (ref 150–400)
RBC: 4.3 MIL/uL (ref 3.87–5.11)
RDW: 13.3 % (ref 11.5–15.5)
WBC: 8.1 10*3/uL (ref 4.0–10.5)
nRBC: 0 % (ref 0.0–0.2)

## 2022-08-30 LAB — SALICYLATE LEVEL: Salicylate Lvl: <7 mg/dL — ABNORMAL LOW (ref 7.0–30.0)

## 2022-08-30 LAB — PREGNANCY, URINE: Preg Test, Ur: NEGATIVE

## 2022-08-30 LAB — ETHANOL: Alcohol, Ethyl (B): <10 mg/dL (ref <10)

## 2022-08-30 LAB — ACETAMINOPHEN LEVEL: Acetaminophen (Tylenol), Serum: <10 ug/mL — ABNORMAL LOW (ref 10–30)

## 2022-08-30 MED ORDER — ARIPIPRAZOLE 10 MG PO TABS
10.0000 mg | ORAL_TABLET | Freq: Every day | ORAL | Status: DC
  Administered 2022-08-30 – 2022-09-02 (×4): 10 mg via ORAL
  Filled 2022-08-30 (×9): qty 1

## 2022-08-30 MED ORDER — AMLODIPINE BESYLATE 5 MG PO TABS
5.0000 mg | ORAL_TABLET | Freq: Every day | ORAL | Status: DC
  Administered 2022-08-31 – 2022-09-03 (×4): 5 mg via ORAL
  Filled 2022-08-30 (×5): qty 1

## 2022-08-30 MED ORDER — MAGNESIUM HYDROXIDE 400 MG/5ML PO SUSP: 30.0000 mL | Freq: Every day | ORAL | Status: DC | PRN

## 2022-08-30 MED ORDER — MELATONIN 5 MG PO TABS
10.0000 mg | ORAL_TABLET | Freq: Every day | ORAL | Status: DC
  Administered 2022-08-30 – 2022-09-02 (×4): 10 mg via ORAL
  Filled 2022-08-30 (×8): qty 2

## 2022-08-30 MED ORDER — ACETAMINOPHEN 325 MG PO TABS
650.0000 mg | ORAL_TABLET | Freq: Four times a day (QID) | ORAL | Status: DC | PRN
  Administered 2022-08-31 – 2022-09-02 (×4): 650 mg via ORAL
  Filled 2022-08-30 (×4): qty 2

## 2022-08-30 MED ORDER — ALUM & MAG HYDROXIDE-SIMETH 200-200-20 MG/5ML PO SUSP: 30.0000 mL | ORAL | Status: DC | PRN

## 2022-08-30 NOTE — ED Provider Notes (Signed)
  [facility] Emergency Department Provider Note MRN:  [id number]  Arrival date & time: 08/30/22     Chief Complaint   Suicidal Thoughts  History of Present Illness   Sharon Ward is a 27 y.o. year-old female presents to the ED with chief complaint of suicidal and homicidal thoughts.  She states that she has been feeling very depressed.  States that she wants to take a bunch of pills and overdose and kill herself.  Reports using ETOH but no drugs.  She denies recent illnesses.  Denies being in any pain.  History provided by patient.   Review of Systems  Pertinent positive and negative review of systems noted in HPI.    Physical Exam   Vitals:   08/30/22 0129  BP: 119/74  Pulse: 90  Resp: 18  Temp: 98.2 F (36.8 C)  SpO2: 99%    CONSTITUTIONAL:  non toxic-appearing, NAD NEURO:  Alert and oriented x 3, CN 3-12 grossly intact EYES:  eyes equal and reactive ENT/NECK:  Supple, no stridor  CARDIO:  normal rate, appears well-perfused  PULM:  No respiratory distress,  GI/GU:  non-distended,  MSK/SPINE:  No gross deformities, no edema, moves all extremities  SKIN:  no rash, atraumatic   *Additional and/or pertinent findings included in MDM below  Diagnostic and Interventional Summary    EKG Interpretation  Date/Time:    Ventricular Rate:    PR Interval:    QRS Duration:   QT Interval:    QTC Calculation:   R Axis:     Text Interpretation:         Labs Reviewed  SALICYLATE LEVEL - Abnormal; Notable for the following components:      Result Value   Salicylate Lvl <9.9 (*)    All other components within normal limits  ACETAMINOPHEN LEVEL - Abnormal; Notable for the following components:   Acetaminophen (Tylenol), Serum <10 (*)    All other components within normal limits  COMPREHENSIVE METABOLIC PANEL  ETHANOL  CBC  RAPID URINE DRUG SCREEN, HOSP PERFORMED  PREGNANCY, URINE  I-STAT BETA HCG BLOOD, ED ([facility], [facility], [facility] ONLY)    No orders  to display    Medications - No data to display   Procedures  /  Critical Care Procedures  ED Course and Medical Decision Making  I have reviewed the triage vital signs, the nursing notes, and pertinent available records from the EMR.  Social Determinants Affecting Complexity of Care: Patient has no clinically significant social determinants affecting this chief complaint..   ED Course:    Medical Decision Making Here with suicidal thoughts and worsening depression.  Amount and/or Complexity of Data Reviewed Labs: ordered.    Details: Preg negative, otherwise unremarkable     Consultants: TTS consult pending   Treatment and Plan: Disposition per TTS consult.    Final Clinical Impressions(s) / ED Diagnoses     ICD-10-CM   1. Suicidal thoughts  R45.851       ED Discharge Orders     None         Discharge Instructions Discussed with and Provided to Patient:   Discharge Instructions   None      Montine Circle, PA-C 08/30/22 0339    Shanon Rosser, MD 08/30/22 [phone]

## 2022-08-30 NOTE — ED Triage Notes (Signed)
Pt here from home with c/o SI and HI thoughts m hx of same , states that she has some pills that she was going to take
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
## 2022-08-30 NOTE — ED Notes (Signed)
Patient currently speaking on the unit's portable phone.

## 2022-08-30 NOTE — ED Notes (Addendum)
Pt transfer to [facility] via  [employer]. 1 large bag of belongings given to driver

## 2022-08-30 NOTE — Progress Notes (Signed)
Pt was accepted to [facility] 08/30/2022, pending negative covid and signed voluntary consent faxed to [phone]. Bed assignment: 975-8  Pt meets inpatient criteria per Charmaine Downs, NP  Attending Physician will be Janine Limbo, MD  Report can be called to: Adult unit: [phone]  Pt can arrive after 2000  Care Team Notified: Lynnda Shields, RN, Scharlene Gloss, RN, and Charmaine Downs, NP  Denna Haggard, [place]  08/30/2022 2:25 PM

## 2022-08-30 NOTE — ED Notes (Signed)
Pt is on phone yelling and explained that the time allocated for phone call has been allowed and she needs to get off phone pt states well no one told me that. Explained rules to pt and that I would allow 1 more min. Pt states to person on phone "well there pissing me off and I am about to flip my shit"

## 2022-08-30 NOTE — ED Notes (Signed)
Pt changed into purple scrubs, pt wanded by security. Belongings in pt's bag and placed in cabinet in triage. Pt tolerated well, agreeable to plan.

## 2022-08-31 ENCOUNTER — Encounter (HOSPITAL_COMMUNITY): Payer: Self-pay | Admitting: Nurse Practitioner

## 2022-08-31 ENCOUNTER — Other Ambulatory Visit: Payer: Self-pay

## 2022-08-31 DIAGNOSIS — F319 Bipolar disorder, unspecified: Secondary | ICD-10-CM | POA: Diagnosis not present

## 2022-08-31 MED ORDER — NICOTINE 14 MG/24HR TD PT24
MEDICATED_PATCH | TRANSDERMAL | Status: AC
  Filled 2022-08-31: qty 1

## 2022-08-31 MED ORDER — NICOTINE 14 MG/24HR TD PT24
14.0000 mg | MEDICATED_PATCH | Freq: Every day | TRANSDERMAL | Status: DC
  Administered 2022-08-31 – 2022-09-01 (×2): 14 mg via TRANSDERMAL
  Filled 2022-08-31 (×4): qty 1

## 2022-08-31 MED ORDER — WHITE PETROLATUM EX OINT
TOPICAL_OINTMENT | CUTANEOUS | Status: AC
  Filled 2022-08-31: qty 5

## 2022-08-31 MED ORDER — FLUOXETINE HCL 20 MG PO CAPS
20.0000 mg | ORAL_CAPSULE | Freq: Every day | ORAL | Status: DC
  Administered 2022-08-31 – 2022-09-03 (×4): 20 mg via ORAL
  Filled 2022-08-31 (×5): qty 1

## 2022-08-31 NOTE — Progress Notes (Signed)
D: Patient alert and oriented. Affect/mood reported as improving. Denies current SI, HI, AVH, and pain. Patient reports she has been depressed since she was here in December and struggling with increasing thoughts of HI. She contracts for safety on unit.   A: Scheduled medication administered to patient, per MD orders. Support and encouragement provided. Routine safety checks conducted every 15 minutes. Patient informed to notify staff with problems or concerns.   R: No adverse drug reactions noted. Patient contracts for safety at this time. Patient compliant with medications and treatment plan. Patient receptive, calm and cooperative. Patient interacts well with others on unit. Patient remains safe at this time.
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
## 2022-08-31 NOTE — Group Note (Signed)
Date:  08/31/2022 Time:  10:14 AM  Group Topic/Focus:  Orientation:   The focus of this group is to educate the patient on the purpose and policies of crisis stabilization and provide a format to answer questions about their admission.  The group details unit policies and expectations of patients while admitted.    Participation Level:  Active  Participation Quality:  Appropriate  Affect:  Appropriate  Cognitive:  Appropriate  Insight: Appropriate  Engagement in Group:  Engaged  Modes of Intervention:  Education  Additional Comments:     Jerrye Beavers 08/31/2022, 10:14 AM

## 2022-08-31 NOTE — Progress Notes (Signed)
Adult Psychoeducational Group Note  Date:  08/31/2022 Time:  9:46 PM  Group Topic/Focus:  Wrap-Up Group:   The focus of this group is to help patients review their daily goal of treatment and discuss progress on daily workbooks.  Participation Level:  Active  Participation Quality:  Appropriate  Affect:  Appropriate  Cognitive:  Appropriate  Insight: Appropriate  Engagement in Group:  Engaged  Modes of Intervention:  Discussion  Additional Comments:   Pt states that she has had a good day but has had some trouble feeling comfortable with socializing. Pt endorsed some anxious and depression feelings but states most of it is from being home sick. Pt wants to work on developing better coping skills while she's here.   Gerhard Perches 08/31/2022, 9:46 PM

## 2022-08-31 NOTE — Tx Team (Addendum)
Initial Treatment Plan 08/31/2022 2:51 AM Sharon Ward [id number]    PATIENT STRESSORS: Legal issue   Marital or family conflict   Traumatic event   Other: Legal custody battle for her kids     PATIENT STRENGTHS: Physical Health    PATIENT IDENTIFIED PROBLEMS: Legal Custody Battle for kids                     DISCHARGE CRITERIA:  Improved stabilization in mood, thinking, and/or behavior  PRELIMINARY DISCHARGE PLAN: Return to previous work or school arrangements  PATIENT/FAMILY INVOLVEMENT: This treatment plan has been presented to and reviewed with the patient, Sharon Ward, and/or family member, .  The patient and family have been given the opportunity to ask questions and make suggestions.  Cherly Beach, RN 08/31/2022, 2:51 AM
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
## 2022-08-31 NOTE — Plan of Care (Signed)
  Problem: Education: Goal: Knowledge of [facility] Education information/materials will improve Outcome: Progressing Goal: Emotional status will improve Outcome: Progressing Goal: Mental status will improve Outcome: Progressing Goal: Verbalization of understanding the information provided will improve Outcome: Progressing   
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
## 2022-08-31 NOTE — Progress Notes (Signed)
Admission Note: report received from [place] patient is a Vol 27 year old fe/female. Pt was calm and cooperative during assessment. Denies SI/HI/AVH. Admission plan of care reviewed with pt, consent signed.  Personal belongings/skin assessment completed.   No contraband found.  Patient oriented to the unit, staff and room.  Routine safety checks initiated.  Verbalizes understanding of unit rules/protocols.   Patient is presently safe on the unit. No unsafe behaviors noted.  Q 15 minute safety checks maintained per unit protocol.
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
## 2022-08-31 NOTE — Group Note (Signed)
[facility] LCSW Group Therapy Note   Group Date: 08/31/2022 Start Time: 1100 End Time: 1200   Type of Therapy/Topic:  Group Therapy:  Balance in Life  Participation Level:  Minimal   Description of Group:    This group will address the concept of balance and how it feels and looks when one is unbalanced. Patients will be encouraged to process areas in their lives that are out of balance, and identify reasons for remaining unbalanced. Facilitators will guide patients utilizing problem- solving interventions to address and correct the stressor making their life unbalanced. Understanding and applying boundaries will be explored and addressed for obtaining  and maintaining a balanced life. Patients will be encouraged to explore ways to assertively make their unbalanced needs known to significant others in their lives, using other group members and facilitator for support and feedback.  Therapeutic Goals: Patient will identify two or more emotions or situations they have that consume much of in their lives. Patient will identify signs/triggers that life has become out of balance:  Patient will identify two ways to set boundaries in order to achieve balance in their lives:  Patient will demonstrate ability to communicate their needs through discussion and/or role plays  Summary of Patient Progress:        Therapeutic Modalities:   Cognitive Behavioral Therapy Solution-Focused Therapy Assertiveness Training   [place], LCSW

## 2022-08-31 NOTE — Group Note (Signed)
Recreation Therapy Group Note   Group Topic:Animal Assisted Therapy   Group Date: 08/31/2022 Start Time: 1430 End Time: 1508 Facilitators: Yukari Flax-McCall, LRT,CTRS Location: [address] Dayroom   Animal-Assisted Activity (AAA) Program Checklist/Progress Notes Patient Eligibility Criteria Checklist & Daily Group note for Rec Tx Intervention  AAA/T Program Assumption of Risk Form signed by Patient/ or Parent Legal Guardian Yes  Patient is free of allergies or severe asthma Yes  Patient reports no fear of animals Yes  Patient reports no history of cruelty to animals Yes  Patient understands his/her participation is voluntary Yes  Patient washes hands before animal contact Yes  Patient washes hands after animal contact Yes  Affect/Mood: Appropriate   Participation Level: Engaged   Participation Quality: Independent   Behavior: Appropriate     Clinical Observations/Individualized Feedback: Patient attended session and interacted appropriately with therapy dog and peers. Patient asked appropriate questions about therapy dog and his training. Patient shared stories about their pets at home with group.     Plan: Continue to engage patient in RT group sessions 2-3x/week.   Jill Stopka-McCall, LRT,CTRS  08/31/2022 3:29 PM

## 2022-09-01 ENCOUNTER — Encounter (HOSPITAL_COMMUNITY): Payer: Self-pay

## 2022-09-01 DIAGNOSIS — F319 Bipolar disorder, unspecified: Secondary | ICD-10-CM | POA: Diagnosis not present

## 2022-09-01 MED ORDER — HYDROXYZINE HCL 25 MG PO TABS
25.0000 mg | ORAL_TABLET | Freq: Three times a day (TID) | ORAL | Status: DC | PRN
  Administered 2022-09-01: 25 mg via ORAL
  Filled 2022-09-01: qty 1

## 2022-09-01 NOTE — Progress Notes (Signed)
[facility] MD Progress Note  09/01/2022 1:25 PM Sharon Ward  MRN:  [id number]   Reason for admission: 27 year old Caucasian female. Sharon Ward was recently admitted, treated, stabilized & discharged from this [facility] last month. At the time, she was admitted for worsening symptoms of depression after an overdose incident. She cited as her stress then a call she received from the DSS that her children will remain in the foster care system.   Daily notes: Sharon Ward is seen, chart reviewed. The chart findings discussed with the treatment team. She presents alert, oriented & aware of situation. She presents with an improving affect, good eye contact & verbally responsive. She reports, "My mood is much better today than I felt yesterday. I have been going to the group sessions, socializing with the other patients. I'm learning coping skills. My mood is a lot calmer today. The racing thoughts are going down as well including the homicidal thoughts. I felt homicidal after I had a fight with my child's father. I feel okay now.  I slept well last night. I'm learning how to talk to other people without being aggressive especially towards the father of my children. I'm taking & tolerating my medications. I have no side effects to report". Sharon Ward currently denies any SIHI, AVH, delusional thoughts or paranoia. She does not appear to be responding to any internal stimuli. She says the homicidal thoughts were a spur of the moment. She says she will never harm her family because she loves them so much. There are currently no changes made on her current plan of care. Will continue as already in progress.  Principal Problem: Bipolar 1 disorder, depressed ([place])  Diagnosis: Principal Problem:   Bipolar 1 disorder, depressed ([place])  Total Time spent with patient:  35 minutes  Past Psychiatric History: Bipolar 1 disorder.  Past Medical History:  Past Medical History:  Diagnosis Date   Asthma    Bipolar 1 disorder, mixed ([place])     Depression    Generalized anxiety disorder    Intentional drug overdose ([place]) 11/03/2020   Low-lying placenta 01/07/2022   Resolved 03/04/22   Relationship dysfunction    Suicide attempt ([place]) 11/02/2020    Past Surgical History:  Procedure Laterality Date   PILONIDAL CYST / SINUS EXCISION  09/11/2013   PILONIDAL CYST EXCISION  05/17/2014   Pilonidal cystectomy with cleft lip   Family History:  Family History  Problem Relation Age of Onset   Asthma Mother    Diabetes Mother    Healthy Mother    Hypertension Father    Asthma Father    Diabetes Father    Healthy Father    Asthma Brother    Hypertension Paternal Uncle    Diabetes Paternal Grandmother    Stroke Paternal Grandfather    Heart disease Paternal Grandfather    Hypertension Paternal Grandfather    Diabetes Paternal Grandfather    Family Psychiatric  History: See H&P.  Social History:  Social History   Substance and Sexual Activity  Alcohol Use Not Currently   Comment: occasional prior to pregnancy     Social History   Substance and Sexual Activity  Drug Use Not Currently   Frequency: 4.0 times per week   Types: Marijuana   Comment: No Delta 9 since February 2023    Social History   Socioeconomic History   Marital status: Single    Spouse name: Not on file   Number of children: 1   Years of education: 66  Highest education level: 10th grade  Occupational History   Not on file  Tobacco Use   Smoking status: Former    Packs/day: 1.00    Years: 15.00    Total pack years: 15.00    Types: Cigarettes    Quit date: 07/14/2021    Years since quitting: 1.1   Smokeless tobacco: Never   Tobacco comments:    only smoke a "couple" of cigarettes when stressed or anxious, socially with friends per [facility] chart  Vaping Use   Vaping Use: Former   Start date: 08/10/2003   Quit date: 05/04/2021  Substance and Sexual Activity   Alcohol use: Not Currently    Comment: occasional prior to pregnancy   Drug use:  Not Currently    Frequency: 4.0 times per week    Types: Marijuana    Comment: No Delta 9 since February 2023   Sexual activity: Not Currently    Partners: Male    Birth control/protection: None  Other Topics Concern   Not on file  Social History Narrative   Not on file   Social Determinants of Health   Financial Resource Strain: Not on file  Food Insecurity: No Food Insecurity (08/31/2022)   Hunger Vital Sign    Worried About Running Out of Food in the Last Year: Never true    [place] in the Last Year: Never true  Transportation Needs: No Transportation Needs (08/31/2022)   PRAPARE - Hydrologist (Medical): No    Lack of Transportation (Non-Medical): No  Physical Activity: Not on file  Stress: Not on file  Social Connections: Not on file   Additional Social History:   Sleep: Good  Appetite:  Good  Current Medications: Current Facility-Administered Medications  Medication Dose Route Frequency Provider Last Rate Last Admin   acetaminophen (TYLENOL) tablet 650 mg  650 mg Oral Q6H PRN Charmaine Downs C, NP   650 mg at 08/31/22 2037   alum & mag hydroxide-simeth (MAALOX/MYLANTA) 200-200-20 MG/5ML suspension 30 mL  30 mL Oral Q4H PRN Charmaine Downs C, NP       amLODipine (NORVASC) tablet 5 mg  5 mg Oral Daily Onuoha, Josephine C, NP   5 mg at 09/01/22 0746   ARIPiprazole (ABILIFY) tablet 10 mg  10 mg Oral QHS Onuoha, Josephine C, NP   10 mg at 08/31/22 2133   FLUoxetine (PROZAC) capsule 20 mg  20 mg Oral Daily Vanetta Rule, Herbert Pun I, NP   20 mg at 09/01/22 0746   magnesium hydroxide (MILK OF MAGNESIA) suspension 30 mL  30 mL Oral Daily PRN Charmaine Downs C, NP       melatonin tablet 10 mg  10 mg Oral QHS Onuoha, Josephine C, NP   10 mg at 08/31/22 2133   nicotine (NICODERM CQ - dosed in mg/24 hours) patch 14 mg  14 mg Transdermal Daily Massengill, Ovid Curd, MD   14 mg at 09/01/22 [id number]   Lab Results:  Results for orders placed or performed  during the hospital encounter of 08/30/22 (from the past 48 hour(s))  Resp panel by RT-PCR (RSV, Flu A&B, Covid) Anterior Nasal Swab     Status: None   Collection Time: 08/30/22  2:27 PM   Specimen: Anterior Nasal Swab  Result Value Ref Range   SARS Coronavirus 2 by RT PCR NEGATIVE NEGATIVE    Comment: (NOTE) SARS-CoV-2 target nucleic acids are NOT DETECTED.  The SARS-CoV-2 RNA is generally detectable in upper respiratory specimens  during the acute phase of infection. The lowest concentration of SARS-CoV-2 viral copies this assay can detect is 138 copies/mL. A negative result does not preclude SARS-Cov-2 infection and should not be used as the sole basis for treatment or other patient management decisions. A negative result may occur with  improper specimen collection/handling, submission of specimen other than nasopharyngeal swab, presence of viral mutation(s) within the areas targeted by this assay, and inadequate number of viral copies(<138 copies/mL). A negative result must be combined with clinical observations, patient history, and epidemiological information. The expected result is Negative.  Fact Sheet for Patients:  BloggerCourse.com  Fact Sheet for Healthcare Providers:  SeriousBroker.it  This test is no t yet approved or cleared by the [place] FDA and  has been authorized for detection and/or diagnosis of SARS-CoV-2 by FDA under an Emergency Use Authorization (EUA). This EUA will remain  in effect (meaning this test can be used) for the duration of the COVID-19 declaration under Section 564(b)(1) of the Act, 21 U.S.C.section 360bbb-3(b)(1), unless the authorization is terminated  or revoked sooner.       Influenza A by PCR NEGATIVE NEGATIVE   Influenza B by PCR NEGATIVE NEGATIVE    Comment: (NOTE) The Xpert Xpress SARS-CoV-2/FLU/RSV plus assay is intended as an aid in the diagnosis of influenza from  Nasopharyngeal swab specimens and should not be used as a sole basis for treatment. Nasal washings and aspirates are unacceptable for Xpert Xpress SARS-CoV-2/FLU/RSV testing.  Fact Sheet for Patients: BloggerCourse.com  Fact Sheet for Healthcare Providers: SeriousBroker.it  This test is not yet approved or cleared by the [place] FDA and has been authorized for detection and/or diagnosis of SARS-CoV-2 by FDA under an Emergency Use Authorization (EUA). This EUA will remain in effect (meaning this test can be used) for the duration of the COVID-19 declaration under Section 564(b)(1) of the Act, 21 U.S.C. section 360bbb-3(b)(1), unless the authorization is terminated or revoked.     Resp Syncytial Virus by PCR NEGATIVE NEGATIVE    Comment: (NOTE) Fact Sheet for Patients: BloggerCourse.com  Fact Sheet for Healthcare Providers: SeriousBroker.it  This test is not yet approved or cleared by the [place] FDA and has been authorized for detection and/or diagnosis of SARS-CoV-2 by FDA under an Emergency Use Authorization (EUA). This EUA will remain in effect (meaning this test can be used) for the duration of the COVID-19 declaration under Section 564(b)(1) of the Act, 21 U.S.C. section 360bbb-3(b)(1), unless the authorization is terminated or revoked.  Performed at [facility], [address] [place], [place] [address]    Blood Alcohol level:  Lab Results  Component Value Date   [facility] <10 08/30/2022   ETH <10 08/21/2022   Metabolic Disorder Labs: Lab Results  Component Value Date   HGBA1C 5.4 07/13/2022   MPG 108 07/13/2022   MPG 91 11/10/2021   Lab Results  Component Value Date   PROLACTIN 48.1 (H) 11/10/2021   Lab Results  Component Value Date   CHOL 206 (H) 07/13/2022   TRIG 60 07/13/2022   HDL 65 07/13/2022   CHOLHDL 3.2 07/13/2022    VLDL 12 07/13/2022   LDLCALC 129 (H) 07/13/2022   LDLCALC 93 11/10/2021   Physical Findings: AIMS: Facial and Oral Movements Muscles of Facial Expression: None, normal Lips and Perioral Area: None, normal Jaw: None, normal Tongue: None, normal,Extremity Movements Upper (arms, wrists, hands, fingers): None, normal Lower (legs, knees, ankles, toes): None, normal, Trunk Movements Neck, shoulders, hips:  None, normal, Overall Severity Severity of abnormal movements (highest score from questions above): None, normal Incapacitation due to abnormal movements: None, normal Patient's awareness of abnormal movements (rate only patient's report): No Awareness, Dental Status Current problems with teeth and/or dentures?: No Does patient usually wear dentures?: No  CIWA:    COWS:     Musculoskeletal: Strength & Muscle Tone: within normal limits Gait & Station: normal Patient leans: N/A  Psychiatric Specialty Exam:  Presentation  General Appearance:  Appropriate for Environment; Casual; Fairly Groomed  Eye Contact: Good  Speech: Clear and Coherent; Normal Rate  Speech Volume: Normal  Handedness: Left  Mood and Affect  Mood: -- ("Improving".)  Affect: Appropriate; Congruent  Thought Process  Thought Processes: Coherent; Goal Directed; Linear  Descriptions of Associations:Intact  Orientation:Full (Time, Place and Person)  Thought Content:Logical  History of Schizophrenia/Schizoaffective disorder:No  Duration of Psychotic Symptoms:N/A  Hallucinations:Hallucinations: None  Ideas of Reference:None  Suicidal Thoughts:Suicidal Thoughts: No SI Active Intent and/or Plan: Without Intent; Without Plan; Without Means to Carry Out; Without Access to Means  Homicidal Thoughts:Homicidal Thoughts: No  Sensorium  Memory: Immediate Good; Recent Good; Remote Good  Judgment: Fair  Insight: Present  Executive Functions  Concentration: Fair  Attention  Span: Fair  Recall: [place] of Knowledge: Fair  Language: Good  Psychomotor Activity  Psychomotor Activity:Psychomotor Activity: Normal  Assets  Assets: Communication Skills; Desire for Improvement; Financial Resources/Insurance; Housing; Physical Health; Resilience; Social Support  Sleep  Sleep:Sleep: Good Number of Hours of Sleep: 6.5  Physical Exam: Physical Exam Vitals and nursing note reviewed.  HENT:     Head: Normocephalic.     Nose: Nose normal.     Mouth/Throat:     Pharynx: Oropharynx is clear.  Cardiovascular:     Rate and Rhythm: Normal rate.     Pulses: Normal pulses.  Pulmonary:     Effort: Pulmonary effort is normal.  Genitourinary:    Comments: Deferred. Musculoskeletal:        General: Normal range of motion.     Cervical back: Normal range of motion.  Skin:    General: Skin is warm and dry.  Neurological:     General: No focal deficit present.     Mental Status: She is alert and oriented to person, place, and time.    Review of Systems  Constitutional:  Negative for chills, diaphoresis and fever.  HENT:  Negative for congestion and sore throat.   Eyes:  Negative for blurred vision.  Respiratory:  Negative for cough, shortness of breath and wheezing.   Cardiovascular:  Negative for chest pain and palpitations.  Gastrointestinal:  Negative for abdominal pain, constipation, diarrhea, heartburn, nausea and vomiting.  Genitourinary:  Negative for dysuria.  Musculoskeletal:  Negative for joint pain and myalgias.  Skin:  Negative for itching and rash.  Neurological:  Negative for dizziness, tingling, tremors, sensory change, speech change, focal weakness, seizures, loss of consciousness, weakness and headaches.  Endo/Heme/Allergies:        See allergies list.  Psychiatric/Behavioral:  Positive for depression. Negative for suicidal ideas.    Blood pressure 110/70, pulse 100, temperature (!) 97.5 F (36.4 C), temperature source Oral, resp.  rate 20, height 5\' 3"  (1.6 m), weight 97.5 kg, SpO2 99 %, not currently breastfeeding. Body mass index is 38.08 kg/m.  Treatment Plan Summary: Daily contact with patient to assess and evaluate symptoms and progress in treatment and Medication management.  Continue inpatient hospitalization.  Will continue today 09/01/2022 plan as  below except where it is noted.   Principal/active diagnoses.  Bipolar 1 disorder, depressed ([place])  Plan: -Continue Abilify 10 mg po daily for mood control. -Continue Fluoxetine 20 mg po daily for depression. -Continue Melatonin 10 mg po Q hs for insomnia.  -Continue Nicotine patch 14 mg trans-dermally Q 24 hrs for nicotine withdrawal symptoms.    Other medical issues.  -Continue amlodipine 5 mg po daily for HTN.   Other PRNS -Continue Tylenol 650 mg every 6 hours PRN for mild pain -Continue Maalox 30 ml Q 4 hrs PRN for indigestion -Continue MOM 30 ml po Q 6 hrs for constipation   Safety and Monitoring: Voluntary admission to inpatient psychiatric unit for safety, stabilization and treatment Daily contact with patient to assess and evaluate symptoms and progress in treatment Patient's case to be discussed in multi-disciplinary team meeting Observation Level : q15 minute checks Vital signs: q12 hours Precautions: Safety   Discharge Planning: Social work and case management to assist with discharge planning and identification of hospital follow-up needs prior to discharge Estimated LOS: 5-7 days Discharge Concerns: Need to establish a safety plan; Medication compliance and effectiveness Discharge Goals: Return home with outpatient referrals for mental health follow-up including medication management/psychotherapy  Lindell Spar, NP, pmhnp, fnp-bc. 09/01/2022, 1:25 PM

## 2022-09-01 NOTE — Group Note (Signed)
Recreation Therapy Group Note   Group Topic:Problem Solving  Group Date: 09/01/2022 Start Time: 0930 End Time: 0950 Facilitators: Filip Luten-McCall, LRT,CTRS Location: [address]   Goal Area(s) Addresses:  Patient will effectively work with peer towards shared goal.  Patient will identify skill used to make activity successful.  Patient will identify how skills used during activity can be used to reach post d/c goals.    Group Description: Patient(s) were given a set of 10 solo cups, a rubber band, and some tied strings. The objective is to build a pyramid with the cups by only using the rubber band and string to move the cups. After the activity the patient(s) are LRT debriefed and discussed what strategies worked, what didn't, and what lessons they can take from the activity and use in life post discharge.      Affect/Mood: Appropriate   Participation Level: Engaged   Participation Quality: Independent   Behavior: Appropriate   Speech/Thought Process: Focused   Insight: Good   Judgement: Good   Modes of Intervention: Group work   Patient Response to Interventions:  Engaged   Education Outcome:  Acknowledges education and In group clarification offered    Clinical Observations/Individualized Feedback: Pt attended and participated in group session.  Pt worked well with peers in completing task.  Pt made suggestions for the group as well as listened to and incorporated the suggestions of others in completing the pyramid.      Plan: Continue to engage patient in RT group sessions 2-3x/week.   Tayla Panozzo-McCall, LRT,CTRS 09/01/2022 12:22 PM

## 2022-09-01 NOTE — Progress Notes (Signed)
D: Patient alert and oriented, able to make needs known. Denies SI/HI, AVH at present. Denies pain at present. Patient goal today "talk to the doc about my safety plan/find new coping skills for when I have racing thoughts/feeling depressed or anxiety." Rates depression 2/10, hopelessness 0/10, and anxiety 0/10.  Patient reports energy level as normal. She reports she slept good last night. Patient does not request any PRN medication at this time.   A: Scheduled medications administered to patient per MD order. Support and encouragement provided. Routine safety checks conducted every fifteen minutes. Patient informed to notify staff with problems or concerns. Frequent verbal contact made.   R: No adverse drug reactions noted. Patient contracts for safety at this time. Patient is compliant with medications and treatment plan. Patient receptive, calm and cooperative. Patient interacts with others appropriately on unit at present. Patient remains safe at present.

## 2022-09-01 NOTE — BH IP Treatment Plan (Signed)
Interdisciplinary Treatment and Diagnostic Plan Update  09/01/2022 Time of Session: 9:50am  Sharon Ward MRN: [id number]  Principal Diagnosis: Bipolar 1 disorder, depressed ([place])  Secondary Diagnoses: Principal Problem:   Bipolar 1 disorder, depressed ([place])   Current Medications:  Current Facility-Administered Medications  Medication Dose Route Frequency Provider Last Rate Last Admin   acetaminophen (TYLENOL) tablet 650 mg  650 mg Oral Q6H PRN Charmaine Downs C, NP   650 mg at 08/31/22 2037   alum & mag hydroxide-simeth (MAALOX/MYLANTA) 200-200-20 MG/5ML suspension 30 mL  30 mL Oral Q4H PRN Charmaine Downs C, NP       amLODipine (NORVASC) tablet 5 mg  5 mg Oral Daily Onuoha, Josephine C, NP   5 mg at 09/01/22 0746   ARIPiprazole (ABILIFY) tablet 10 mg  10 mg Oral QHS Onuoha, Josephine C, NP   10 mg at 08/31/22 2133   FLUoxetine (PROZAC) capsule 20 mg  20 mg Oral Daily Nwoko, Herbert Pun I, NP   20 mg at 09/01/22 0746   magnesium hydroxide (MILK OF MAGNESIA) suspension 30 mL  30 mL Oral Daily PRN Charmaine Downs C, NP       melatonin tablet 10 mg  10 mg Oral QHS Onuoha, Josephine C, NP   10 mg at 08/31/22 2133   nicotine (NICODERM CQ - dosed in mg/24 hours) patch 14 mg  14 mg Transdermal Daily Massengill, Ovid Curd, MD   14 mg at 09/01/22 0746   PTA Medications: Medications Prior to Admission  Medication Sig Dispense Refill Last Dose   amLODipine (NORVASC) 5 MG tablet Take 5 mg by mouth daily.      ARIPiprazole (ABILIFY) 10 MG tablet Take 1 tablet (10 mg total) by mouth at bedtime. 30 tablet 0    Melatonin 10 MG TABS Take 10 mg by mouth at bedtime.       Patient Stressors: Legal issue   Marital or family conflict   Traumatic event   Other: Legal custody battle for her kids    Patient Strengths: Physical Health   Treatment Modalities: Medication Management, Group therapy, Case management,  1 to 1 session with clinician, Psychoeducation, Recreational therapy.   Physician  Treatment Plan for Primary Diagnosis: Bipolar 1 disorder, depressed ([place]) Long Term Goal(s): Improvement in symptoms so as ready for discharge   Short Term Goals: Ability to identify and develop effective coping behaviors will improve Ability to maintain clinical measurements within normal limits will improve Ability to identify triggers associated with substance abuse/mental health issues will improve Ability to identify changes in lifestyle to reduce recurrence of condition will improve Ability to verbalize feelings will improve Ability to disclose and discuss suicidal ideas Ability to demonstrate self-control will improve  Medication Management: Evaluate patient's response, side effects, and tolerance of medication regimen.  Therapeutic Interventions: 1 to 1 sessions, Unit Group sessions and Medication administration.  Evaluation of Outcomes: Not Met  Physician Treatment Plan for Secondary Diagnosis: Principal Problem:   Bipolar 1 disorder, depressed ([place])  Long Term Goal(s): Improvement in symptoms so as ready for discharge   Short Term Goals: Ability to identify and develop effective coping behaviors will improve Ability to maintain clinical measurements within normal limits will improve Ability to identify triggers associated with substance abuse/mental health issues will improve Ability to identify changes in lifestyle to reduce recurrence of condition will improve Ability to verbalize feelings will improve Ability to disclose and discuss suicidal ideas Ability to demonstrate self-control will improve     Medication Management: Evaluate patient's  response, side effects, and tolerance of medication regimen.  Therapeutic Interventions: 1 to 1 sessions, Unit Group sessions and Medication administration.  Evaluation of Outcomes: Not Met   RN Treatment Plan for Primary Diagnosis: Bipolar 1 disorder, depressed ([place]) Long Term Goal(s): Knowledge of disease and therapeutic  regimen to maintain health will improve  Short Term Goals: Ability to remain free from injury will improve, Ability to participate in decision making will improve, Ability to verbalize feelings will improve, Ability to disclose and discuss suicidal ideas, and Ability to identify and develop effective coping behaviors will improve  Medication Management: RN will administer medications as ordered by provider, will assess and evaluate patient's response and provide education to patient for prescribed medication. RN will report any adverse and/or side effects to prescribing provider.  Therapeutic Interventions: 1 on 1 counseling sessions, Psychoeducation, Medication administration, Evaluate responses to treatment, Monitor vital signs and CBGs as ordered, Perform/monitor CIWA, COWS, AIMS and Fall Risk screenings as ordered, Perform wound care treatments as ordered.  Evaluation of Outcomes: Not Met   LCSW Treatment Plan for Primary Diagnosis: Bipolar 1 disorder, depressed ([place]) Long Term Goal(s): Safe transition to appropriate next level of care at discharge, Engage patient in therapeutic group addressing interpersonal concerns.  Short Term Goals: Engage patient in aftercare planning with referrals and resources, Increase social support, Increase emotional regulation, Facilitate acceptance of mental health diagnosis and concerns, Identify triggers associated with mental health/substance abuse issues, and Increase skills for wellness and recovery  Therapeutic Interventions: Assess for all discharge needs, 1 to 1 time with Social worker, Explore available resources and support systems, Assess for adequacy in community support network, Educate family and significant other(s) on suicide prevention, Complete Psychosocial Assessment, Interpersonal group therapy.  Evaluation of Outcomes: Not Met   Progress in Treatment: Attending groups: Yes. Participating in groups: Yes. Taking medication as prescribed:  Yes. Toleration medication: Yes. Family/Significant other contact made: Yes, individual(s) contacted:  Child's father  Patient understands diagnosis: No. Discussing patient identified problems/goals with staff: Yes. Medical problems stabilized or resolved: Yes. Denies suicidal/homicidal ideation: Yes. Issues/concerns per patient self-inventory: No.   New problem(s) identified: No, Describe:  None   New Short Term/Long Term Goal(s): medication stabilization, elimination of SI thoughts, development of comprehensive mental wellness plan.   Patient Goals: "Safety planning and coping skills"  Discharge Plan or Barriers: Patient recently admitted. CSW will continue to follow and assess for appropriate referrals and possible discharge planning.   Reason for Continuation of Hospitalization: Anxiety Depression Medication stabilization Suicidal ideation  Estimated Length of Stay: 3 to 7 days   Last [place] Suicide Severity Risk Score: [place] Admission (Current) from 08/30/2022 in [place] Most recent reading at 08/31/2022  2:00 AM ED from 08/30/2022 in [facility] Most recent reading at 08/30/2022  1:29 AM ED from 08/20/2022 in [facility] Most recent reading at 08/21/2022  1:58 AM  C-SSRS RISK CATEGORY No Risk High Risk High Risk       Last PHQ 2/9 Scores:    08/21/2022   12:43 AM 08/18/2022    3:46 PM 05/26/2022    3:44 PM  Depression screen PHQ 2/9  Decreased Interest 2 2 0  Down, Depressed, Hopeless 2 2 0  PHQ - 2 Score 4 4 0  Altered sleeping 1 3 1   Tired, decreased energy 1 2 1   Change in appetite 1 3 1   Feeling bad or failure  about yourself  1 3 0  Trouble concentrating 1 0 0  Moving slowly or fidgety/restless 0 0 0  Suicidal thoughts 1 0 0  PHQ-9 Score 10 15 3   Difficult doing work/chores Very difficult      Scribe for Treatment Team: ,  Aram Beecham 09/01/2022 1:45 PM

## 2022-09-01 NOTE — Plan of Care (Signed)
  Problem: Safety: Goal: Periods of time without injury will increase Outcome: Progressing   

## 2022-09-01 NOTE — Progress Notes (Signed)
   09/01/22 0200  Psych Admission Type (Psych Patients Only)  Admission Status Voluntary  Psychosocial Assessment  Patient Complaints None  Eye Contact Fair  Facial Expression Anxious  Affect Anxious  Speech Logical/coherent  Interaction Assertive  Appearance/Hygiene Unremarkable  Behavior Characteristics Cooperative  Mood Pleasant  Thought Process  Coherency WDL  Content WDL  Delusions None reported or observed  Perception WDL  Hallucination None reported or observed  Judgment Limited  Confusion None  Danger to Self  Current suicidal ideation? Denies  Agreement Not to Harm Self Yes  Description of Agreement verbal  Danger to Others  Danger to Others None reported or observed   Alert/oriented. Makes needs/concerns known to staff. Pleasant cooperative with staff. Denies SI/HI/A/V hallucinations. Med compliant. PRN med given with good effect. Patient states went to group. Will encourage continue compliance and progression towards goals. Verbally contracted for safety. Will continue to monitor.

## 2022-09-01 NOTE — Plan of Care (Signed)
  Problem: Education: Goal: Knowledge of [facility] Education information/materials will improve Outcome: Progressing Goal: Emotional status will improve Outcome: Progressing Goal: Mental status will improve Outcome: Progressing Goal: Verbalization of understanding the information provided will improve Outcome: Progressing   Problem: Coping: Goal: Ability to verbalize frustrations and anger appropriately will improve Outcome: Progressing Goal: Ability to demonstrate self-control will improve Outcome: Progressing   Problem: Health Behavior/Discharge Planning: Goal: Identification of resources available to assist in meeting health care needs will improve Outcome: Progressing Goal: Compliance with treatment plan for underlying cause of condition will improve Outcome: Progressing   Problem: Physical Regulation: Goal: Ability to maintain clinical measurements within normal limits will improve Outcome: Progressing   Problem: Safety: Goal: Periods of time without injury will increase Outcome: Progressing

## 2022-09-02 DIAGNOSIS — F319 Bipolar disorder, unspecified: Secondary | ICD-10-CM | POA: Diagnosis not present

## 2022-09-02 NOTE — Progress Notes (Signed)
   09/01/22 2109  Psych Admission Type (Psych Patients Only)  Admission Status Voluntary  Psychosocial Assessment  Patient Complaints Anxiety  Eye Contact Fair  Facial Expression Anxious  Affect Anxious  Speech Logical/coherent  Interaction Assertive  Appearance/Hygiene Unremarkable  Behavior Characteristics Anxious  Mood Anxious  Thought Process  Coherency WDL  Content WDL  Delusions None reported or observed  Perception WDL  Hallucination None reported or observed  Judgment Limited  Confusion None  Danger to Self  Current suicidal ideation? Denies  Agreement Not to Harm Self Yes  Description of Agreement verbal  Danger to Others  Danger to Others None reported or observed  Danger to Others Abnormal  Harmful Behavior to others No threats or harm toward other people   Alert/oriented. Patient received bad news about her child, requesting to leave the hospital tonight to deal with emergency. [place] NP notified. [place] ordered prn anxiety.  Prn med given for anxiety. Will monitor effectiveness. Denies SI/HI/A/V/H. Patient went to group, encouraged to use coping strategies when dealing with anxiety. Verbally contracted for safety.

## 2022-09-02 NOTE — Progress Notes (Signed)
D) Pt received calm, visible, participating in milieu, and in no acute distress. Pt A & O x4. Pt denies SI, HI, A/ V H, depression, anxiety and pain at this time. A) Pt encouraged to drink fluids. Pt encouraged to come to staff with needs. Pt encouraged to attend and participate in groups. Pt encouraged to set reachable goals.  R) Pt remained safe on unit, in no acute distress, will continue to assess.     09/02/22 2100  Psych Admission Type (Psych Patients Only)  Admission Status Voluntary  Psychosocial Assessment  Patient Complaints Anxiety  Eye Contact Fair  Facial Expression Anxious  Affect Appropriate to circumstance;Anxious  Speech Logical/coherent  Interaction Assertive  Motor Activity  (wnl)  Appearance/Hygiene Unremarkable  Behavior Characteristics Anxious  Mood Anxious  Thought Process  Coherency WDL  Content WDL  Delusions None reported or observed  Perception WDL  Hallucination None reported or observed  Judgment Limited  Confusion None  Danger to Self  Current suicidal ideation? Denies  Self-Injurious Behavior No self-injurious ideation or behavior indicators observed or expressed   Agreement Not to Harm Self Yes  Description of Agreement verbal  Danger to Others  Danger to Others None reported or observed  Danger to Others Abnormal  Harmful Behavior to others No threats or harm toward other people

## 2022-09-02 NOTE — [facility] Group Notes (Signed)
Pt attended wrap up group 

## 2022-09-02 NOTE — Progress Notes (Signed)
D: Patient is alert, oriented, pleasant, and cooperative. Denies SI, HI, AVH, and verbally contracts for safety. Patient reports she slept good last night without sleeping medication. Patient reports her appetite as good, energy level as normal, and concentration as good. Patient rates her depression 0/10, hopelessness 0/10, and anxiety 2/10. Patient denies physical symptoms/pain. Patient states his/her goal is "to talk to the doctor about discharge".    A: Scheduled medications administered per MD order. Support provided. Patient educated on safety on the unit and medications. Routine safety checks every 15 minutes. Patient stated understanding to tell nurse about any new physical symptoms. Patient understands to tell staff of any needs.     R: No adverse drug reactions noted. Patient verbally contracts for safety. Patient remains safe at this time and will continue to monitor.    09/02/22 0900  Psych Admission Type (Psych Patients Only)  Admission Status Voluntary  Psychosocial Assessment  Patient Complaints Anxiety  Eye Contact Fair  Facial Expression Anxious  Affect Anxious;Appropriate to circumstance  Speech Logical/coherent  Interaction Assertive  Appearance/Hygiene Unremarkable  Behavior Characteristics Anxious  Mood Anxious  Aggressive Behavior  Effect No apparent injury  Thought Process  Coherency WDL  Content WDL  Delusions None reported or observed  Perception WDL  Hallucination None reported or observed  Judgment Limited  Confusion None  Danger to Self  Current suicidal ideation? Denies  Agreement Not to Harm Self Yes  Description of Agreement verbal  Danger to Others  Danger to Others None reported or observed  Danger to Others Abnormal  Harmful Behavior to others No threats or harm toward other people

## 2022-09-02 NOTE — Group Note (Signed)
[facility] LCSW Group Therapy Note   Group Date: 09/02/2022 Start Time: 1100 End Time: 1200   Type of Therapy and Topic: Group Therapy: Avoiding Self-Sabotaging and Enabling Behaviors  Participation Level: Active  Mood:  Description of Group:  In this group, patients will learn how to identify obstacles, self-sabotaging and enabling behaviors, as well as: what are they, why do we do them and what needs these behaviors meet. Discuss unhealthy relationships and how to have positive healthy boundaries with those that sabotage and enable. Explore aspects of self-sabotage and enabling in yourself and how to limit these self-destructive behaviors in everyday life.   Therapeutic Goals: 1. Patient will identify one obstacle that relates to self-sabotage and enabling behaviors 2. Patient will identify one personal self-sabotaging or enabling behavior they did prior to admission 3. Patient will state a plan to change the above identified behavior 4. Patient will demonstrate ability to communicate their needs through discussion and/or role play.    Summary of Patient Progress: Pt provided good insight   Therapeutic Modalities:  Cognitive Behavioral Therapy Person-Centered Therapy Motivational Interviewing    Jezelle Gullick S Calvina Liptak, LCSW

## 2022-09-02 NOTE — Progress Notes (Signed)
[facility] MD Progress Note  09/02/2022 4:48 PM Sharon Ward  MRN:  [id number]   Reason for admission: 27 year old Caucasian female. Sharon Ward was recently admitted, treated, stabilized & discharged from this [facility] last month. At the time, she was admitted for worsening symptoms of depression after an overdose incident. She cited as her stress then a call she received from the DSS that her children will remain in the foster care system.   Daily notes: Sharon Ward is seen, chart reviewed. The chart findings discussed with the treatment team. She presents alert, oriented & aware of situation. She presents with a worried affect, good eye contact & verbally responsive. She reports, "I'm doing well. My mood is good. I'm doing well on my medicines. I have no side effects. I'm sleeping well & I'm no longer depressed. I had a DSS worker came by here last night to inform me that my daughter has been taken away from her father & placed in a foster care system. And because I have not talked to my lawyer, I refused to talk to the Sharon Ward worker. But I'm worried about my child. I need to go home & see what to do about getting her out of the foster care system. This is a serious matter. The information I got from my baby's father was that the baby was taken away from him because I am in the Ward. I also learned that my 29 year old daughter has also be taken away from her foster care parents to another foster home because her current foster parents broke the arm of a 48 month old baby under their care. I have got to go home. Sharon Ward continues to denies any SIHI, AVH, delusional thoughts or paranoia. 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 She adds that this well documented. The therapist stated that whenever Sharon Ward calls to talk to her baby's father who has the custody of her last baby, and if this man does not want to  engage in any conversation with Sharon Ward, that [place] would think that he is with another woman & will start threatening SIHI. The therapist went further to say that [facility] does not have custody of any of her children, that chances of her hurting any of them including her baby's father is very low. She adds that they are developing a plan/coping skills with Sharon Ward on how to manage & control her impulsive thinking pattern.  This case was discussed with the attending psychiatrist, it was decided that Sharon Ward is currently at her baseline at this time & is doing well on her medications. She is likely to be discharged tomorrow morning if still stable.  Principal Problem: Bipolar 1 disorder, depressed ([place])  Diagnosis: Principal Problem:   Bipolar 1 disorder, depressed ([place])  Total Time spent with patient:  35 minutes  Past Psychiatric History: Bipolar 1 disorder.  Past Medical History:  Past Medical History:  Diagnosis Date   Asthma    Bipolar 1 disorder, mixed ([place])    Depression    Generalized anxiety disorder    Intentional drug overdose ([place]) 11/03/2020   Low-lying placenta 01/07/2022   Resolved 03/04/22   Relationship dysfunction    Suicide attempt ([place]) 11/02/2020    Past Surgical History:  Procedure Laterality Date   PILONIDAL CYST / SINUS EXCISION  09/11/2013   PILONIDAL CYST EXCISION  05/17/2014   Pilonidal cystectomy  with cleft lip   Family History:  Family History  Problem Relation Age of Onset   Asthma Mother    Diabetes Mother    Healthy Mother    Hypertension Father    Asthma Father    Diabetes Father    Healthy Father    Asthma Brother    Hypertension Paternal Uncle    Diabetes Paternal Grandmother    Stroke Paternal Grandfather    Heart disease Paternal Grandfather    Hypertension Paternal Grandfather    Diabetes Paternal Grandfather    Family Psychiatric  History: See H&P.  Social History:  Social History   Substance and Sexual Activity  Alcohol Use  Not Currently   Comment: occasional prior to pregnancy     Social History   Substance and Sexual Activity  Drug Use Not Currently   Frequency: 4.0 times per week   Types: Marijuana   Comment: No Delta 9 since February 2023    Social History   Socioeconomic History   Marital status: Single    Spouse name: Not on file   Number of children: 1   Years of education: 10   Highest education level: 10th grade  Occupational History   Not on file  Tobacco Use   Smoking status: Former    Packs/day: 1.00    Years: 15.00    Total pack years: 15.00    Types: Cigarettes    Quit date: 07/14/2021    Years since quitting: 1.1   Smokeless tobacco: Never   Tobacco comments:    only smoke a "couple" of cigarettes when stressed or anxious, socially with friends per [facility] chart  Vaping Use   Vaping Use: Former   Start date: 08/10/2003   Quit date: 05/04/2021  Substance and Sexual Activity   Alcohol use: Not Currently    Comment: occasional prior to pregnancy   Drug use: Not Currently    Frequency: 4.0 times per week    Types: Marijuana    Comment: No Delta 9 since February 2023   Sexual activity: Not Currently    Partners: Male    Birth control/protection: None  Other Topics Concern   Not on file  Social History Narrative   Not on file   Social Determinants of Health   Financial Resource Strain: Not on file  Food Insecurity: No Food Insecurity (08/31/2022)   Hunger Vital Sign    Worried About Running Out of Food in the Last Year: Never true    Ran Out of Food in the Last Year: Never true  Transportation Needs: No Transportation Needs (08/31/2022)   PRAPARE - Administrator, Civil Service (Medical): No    Lack of Transportation (Non-Medical): No  Physical Activity: Not on file  Stress: Not on file  Social Connections: Not on file   Additional Social History:   Sleep: Good  Appetite:  Good  Current Medications: Current Facility-Administered Medications  Medication  Dose Route Frequency Provider Last Rate Last Admin   acetaminophen (TYLENOL) tablet 650 mg  650 mg Oral Q6H PRN Dahlia Byes C, NP   650 mg at 09/02/22 0917   alum & mag hydroxide-simeth (MAALOX/MYLANTA) 200-200-20 MG/5ML suspension 30 mL  30 mL Oral Q4H PRN Dahlia Byes C, NP       amLODipine (NORVASC) tablet 5 mg  5 mg Oral Daily Onuoha, Josephine C, NP   5 mg at 09/02/22 0752   ARIPiprazole (ABILIFY) tablet 10 mg  10 mg Oral QHS Dahlia Byes  C, NP   10 mg at 09/01/22 2027   FLUoxetine (PROZAC) capsule 20 mg  20 mg Oral Daily Armandina Stammer I, NP   20 mg at 09/02/22 7989   hydrOXYzine (ATARAX) tablet 25 mg  25 mg Oral TID PRN Onuoha, Chinwendu V, NP   25 mg at 09/01/22 2139   magnesium hydroxide (MILK OF MAGNESIA) suspension 30 mL  30 mL Oral Daily PRN Dahlia Byes C, NP       melatonin tablet 10 mg  10 mg Oral QHS Onuoha, Josephine C, NP   10 mg at 09/01/22 2138   nicotine (NICODERM CQ - dosed in mg/24 hours) patch 14 mg  14 mg Transdermal Daily Massengill, Harrold Donath, MD   14 mg at 09/01/22 0746   Lab Results:  No results found for this or any previous visit (from the past 48 hour(s)).  Blood Alcohol level:  Lab Results  Component Value Date   ETH <10 08/30/2022   ETH <10 08/21/2022   Metabolic Disorder Labs: Lab Results  Component Value Date   HGBA1C 5.4 07/13/2022   MPG 108 07/13/2022   MPG 91 11/10/2021   Lab Results  Component Value Date   PROLACTIN 48.1 (H) 11/10/2021   Lab Results  Component Value Date   CHOL 206 (H) 07/13/2022   TRIG 60 07/13/2022   HDL 65 07/13/2022   CHOLHDL 3.2 07/13/2022   VLDL 12 07/13/2022   LDLCALC 129 (H) 07/13/2022   LDLCALC 93 11/10/2021   Physical Findings: AIMS: Facial and Oral Movements Muscles of Facial Expression: None, normal Lips and Perioral Area: None, normal Jaw: None, normal Tongue: None, normal,Extremity Movements Upper (arms, wrists, hands, fingers): None, normal Lower (legs, knees, ankles, toes): None,  normal, Trunk Movements Neck, shoulders, hips: None, normal, Overall Severity Severity of abnormal movements (highest score from questions above): None, normal Incapacitation due to abnormal movements: None, normal Patient's awareness of abnormal movements (rate only patient's report): No Awareness, Dental Status Current problems with teeth and/or dentures?: No Does patient usually wear dentures?: No  CIWA:    COWS:     Musculoskeletal: Strength & Muscle Tone: within normal limits Gait & Station: normal Patient leans: N/A  Psychiatric Specialty Exam:  Presentation  General Appearance:  Appropriate for Environment; Casual; Fairly Groomed  Eye Contact: Good  Speech: Clear and Coherent; Normal Rate  Speech Volume: Normal  Handedness: Left  Mood and Affect  Mood: -- ("Improving".)  Affect: Appropriate; Congruent  Thought Process  Thought Processes: Coherent; Goal Directed; Linear  Descriptions of Associations:Intact  Orientation:Full (Time, Place and Person)  Thought Content:Logical  History of Schizophrenia/Schizoaffective disorder:No  Duration of Psychotic Symptoms:N/A  Hallucinations:Hallucinations: None  Ideas of Reference:None  Suicidal Thoughts:Suicidal Thoughts: No SI Active Intent and/or Plan: Without Intent; Without Plan; Without Means to Carry Out; Without Access to Means  Homicidal Thoughts:Homicidal Thoughts: No  Sensorium  Memory: Immediate Good; Recent Good; Remote Good  Judgment: Fair  Insight: Present  Executive Functions  Concentration: Fair  Attention Span: Fair  Recall: Fair  Fund of Knowledge: Fair  Language: Good  Psychomotor Activity  Psychomotor Activity:Psychomotor Activity: Normal  Assets  Assets: Communication Skills; Desire for Improvement; Financial Resources/Insurance; Housing; Physical Health; Resilience; Social Support  Sleep  Sleep:Sleep: Good Number of Hours of Sleep: 6.5  Physical  Exam: Physical Exam Vitals and nursing note reviewed.  HENT:     Head: Normocephalic.     Nose: Nose normal.     Mouth/Throat:     Pharynx: Oropharynx  is clear.  Cardiovascular:     Rate and Rhythm: Normal rate.     Pulses: Normal pulses.  Pulmonary:     Effort: Pulmonary effort is normal.  Genitourinary:    Comments: Deferred. Musculoskeletal:        General: Normal range of motion.     Cervical back: Normal range of motion.  Skin:    General: Skin is warm and dry.  Neurological:     General: No focal deficit present.     Mental Status: She is alert and oriented to person, place, and time.    Review of Systems  Constitutional:  Negative for chills, diaphoresis and fever.  HENT:  Negative for congestion and sore throat.   Eyes:  Negative for blurred vision.  Respiratory:  Negative for cough, shortness of breath and wheezing.   Cardiovascular:  Negative for chest pain and palpitations.  Gastrointestinal:  Negative for abdominal pain, constipation, diarrhea, heartburn, nausea and vomiting.  Genitourinary:  Negative for dysuria.  Musculoskeletal:  Negative for joint pain and myalgias.  Skin:  Negative for itching and rash.  Neurological:  Negative for dizziness, tingling, tremors, sensory change, speech change, focal weakness, seizures, loss of consciousness, weakness and headaches.  Endo/Heme/Allergies:        See allergies list.  Psychiatric/Behavioral:  Positive for depression. Negative for suicidal ideas.    Blood pressure 132/61, pulse 85, temperature 98.4 F (36.9 C), temperature source Oral, resp. rate 20, height 5\' 3"  (1.6 m), weight 97.5 kg, SpO2 98 %, not currently breastfeeding. Body mass index is 38.08 kg/m.  Treatment Plan Summary: Daily contact with patient to assess and evaluate symptoms and progress in treatment and Medication management.  Continue inpatient hospitalization.  Will continue today 09/02/2022 plan as below except where it is noted.    Principal/active diagnoses.  Bipolar 1 disorder, depressed ([place])  Plan: -Continue Abilify 10 mg po daily for mood control. -Continue Fluoxetine 20 mg po daily for depression. -Continue Melatonin 10 mg po Q hs for insomnia.  -Continue Nicotine patch 14 mg trans-dermally Q 24 hrs for nicotine withdrawal symptoms.    Other medical issues.  -Continue amlodipine 5 mg po daily for HTN.   Other PRNS -Continue Tylenol 650 mg every 6 hours PRN for mild pain -Continue Maalox 30 ml Q 4 hrs PRN for indigestion -Continue MOM 30 ml po Q 6 hrs for constipation   Safety and Monitoring: Voluntary admission to inpatient psychiatric unit for safety, stabilization and treatment Daily contact with patient to assess and evaluate symptoms and progress in treatment Patient's case to be discussed in multi-disciplinary team meeting Observation Level : q15 minute checks Vital signs: q12 hours Precautions: Safety   Discharge Planning: Social work and case management to assist with discharge planning and identification of Ward follow-up needs prior to discharge Estimated LOS: 5-7 days Discharge Concerns: Need to establish a safety plan; Medication compliance and effectiveness Discharge Goals: Return home with outpatient referrals for mental health follow-up including medication management/psychotherapy  Lindell Spar, NP, pmhnp, fnp-bc. 09/02/2022, 4:48 PMPatient ID: Sharon Ward, female   DOB: 1996/04/14, 27 y.o.   MRN: [id number]

## 2022-09-03 DIAGNOSIS — F319 Bipolar disorder, unspecified: Secondary | ICD-10-CM

## 2022-09-03 MED ORDER — ARIPIPRAZOLE 10 MG PO TABS: 10.0000 mg | ORAL_TABLET | Freq: Every day | ORAL | 0 refills | Status: DC

## 2022-09-03 MED ORDER — HYDROXYZINE HCL 25 MG PO TABS: 25.0000 mg | ORAL_TABLET | Freq: Three times a day (TID) | ORAL | 0 refills | Status: DC | PRN

## 2022-09-03 MED ORDER — NICOTINE 14 MG/24HR TD PT24: 14.0000 mg | MEDICATED_PATCH | Freq: Every day | TRANSDERMAL | 0 refills | Status: DC

## 2022-09-03 MED ORDER — AMLODIPINE BESYLATE 5 MG PO TABS: 5.0000 mg | ORAL_TABLET | Freq: Every day | ORAL | 0 refills | Status: DC

## 2022-09-03 MED ORDER — FLUOXETINE HCL 20 MG PO CAPS: 20.0000 mg | ORAL_CAPSULE | Freq: Every day | ORAL | 0 refills | Status: DC

## 2022-09-03 MED ORDER — MELATONIN 10 MG PO TABS: 10.0000 mg | ORAL_TABLET | Freq: Every day | ORAL | 0 refills | Status: DC

## 2022-09-03 NOTE — [facility] Suicide Risk Assessment (Signed)
Suicide Risk Assessment  Discharge Assessment    [facility] Discharge Suicide Risk Assessment   Principal Problem: Bipolar 1 disorder, depressed ([place])  Discharge Diagnoses: Principal Problem:   Bipolar 1 disorder, depressed ([place])  Total Time spent with patient:  Greater than 30 minutes  Musculoskeletal: Strength & Muscle Tone: within normal limits Gait & Station: normal Patient leans: N/A  Psychiatric Specialty Exam  Presentation  General Appearance:  Appropriate for Environment; Casual; Fairly Groomed  Eye Contact: Good  Speech: Clear and Coherent; Normal Rate  Speech Volume: Normal  Handedness: Right   Mood and Affect  Mood: Euthymic  Duration of Depression Symptoms: Greater than two weeks  Affect: Appropriate; Congruent   Thought Process  Thought Processes: Coherent; Goal Directed; Linear  Descriptions of Associations:Intact  Orientation:Full (Time, Place and Person)  Thought Content:Logical  History of Schizophrenia/Schizoaffective disorder:No  Duration of Psychotic Symptoms:N/A  Hallucinations:Hallucinations: None  Ideas of Reference:None  Suicidal Thoughts:Suicidal Thoughts: No SI Active Intent and/or Plan: Without Intent; Without Plan; Without Means to Carry Out; Without Access to Means  Homicidal Thoughts:No data recorded  Sensorium  Memory: Immediate Good; Recent Good; Remote Good  Judgment: Good  Insight: Good   Executive Functions  Concentration: Good  Attention Span: Good  Recall: Good  Fund of Knowledge: Good  Language: Good   Psychomotor Activity  Psychomotor Activity:Psychomotor Activity: Normal   Assets  Assets: Communication Skills; Desire for Improvement; Housing; Physical Health; Resilience; Social Support   Sleep  Sleep:Sleep: Good Number of Hours of Sleep: 7.5   Physical Exam: See H&P.  Blood pressure 110/76, pulse 85, temperature 98.5 F (36.9 C), temperature source Oral, resp. rate 20,  height 5\' 3"  (1.6 m), weight 97.5 kg, SpO2 100 %, not currently breastfeeding. Body mass index is 38.08 kg/m.  Mental Status Per Nursing Assessment::   On Admission:  NA  Demographic Factors:  Adolescent or young adult, Caucasian, and Living alone  Loss Factors: Currently dealing child being in custody of DSS.  Historical Factors: Prior suicide attempts and Impulsivity  Risk Reduction Factors:   Responsible for children under 30 years of age, Sense of responsibility to family, Positive social support, Positive therapeutic relationship, and Positive coping skills or problem solving skills  Continued Clinical Symptoms:  Depression:   Impulsivity Previous Psychiatric Diagnoses and Treatments  Cognitive Features That Contribute To Risk:  Closed-mindedness, Polarized thinking, and Thought constriction (tunnel vision)    Suicide Risk:  Minimal: No identifiable suicidal ideation.  Patients presenting with no risk factors but with morbid ruminations; may be classified as minimal risk based on the severity of the depressive symptoms   [place]. Call.   Specialty: Behavioral Health Why: Please call to confirm an appointment with this provider on  09/06/22 at 12:30 pm. Contact information: [place] [address] [phone]         [facility] Follow up on 09/04/2022.   Why: Please follow-up with your ACT Team after discharge. You may also contact the Crisis Line at any time. Contact information: [place] [address] [phone]                Plan Of Care/Follow-up recommendations:  See the discharge recommendations above.  Lindell Spar, NP, pmhnp, fnp-bc. 09/03/2022, 10:03 AM

## 2022-09-03 NOTE — Discharge Summary (Signed)
Physician Discharge Summary Note  Patient:  Sharon Ward is an 27 y.o., female  MRN:  [id number]  DOB:  [birth year]  Patient phone:  [phone] (home)   Patient address:   [address] [place] [address],   Total Time spent with patient:  Greater than 30 minutes  Date of Admission:  08/30/2022  Date of Discharge: 09-03-22.  Reason for Admission: Worsening suicidal & homicidal thoughts towards her husband & her children.  Principal Problem: Bipolar 1 disorder, depressed (HCC)  Discharge Diagnoses: Principal Problem:   Bipolar 1 disorder, depressed (HCC)  Past Psychiatric History: Bipolar disorder, depressed.  Past Medical History:  Past Medical History:  Diagnosis Date   Asthma    Bipolar 1 disorder, mixed (HCC)    Depression    Generalized anxiety disorder    Intentional drug overdose (HCC) 11/03/2020   Low-lying placenta 01/07/2022   Resolved 03/04/22   Relationship dysfunction    Suicide attempt (HCC) 11/02/2020    Past Surgical History:  Procedure Laterality Date   PILONIDAL CYST / SINUS EXCISION  09/11/2013   PILONIDAL CYST EXCISION  05/17/2014   Pilonidal cystectomy with cleft lip   Family History:  Family History  Problem Relation Age of Onset   Asthma Mother    Diabetes Mother    Healthy Mother    Hypertension Father    Asthma Father    Diabetes Father    Healthy Father    Asthma Brother    Hypertension Paternal Uncle    Diabetes Paternal Grandmother    Stroke Paternal Grandfather    Heart disease Paternal Grandfather    Hypertension Paternal Grandfather    Diabetes Paternal Grandfather    Family Psychiatric  History: See H&P.  Social History:  Social History   Substance and Sexual Activity  Alcohol Use Not Currently   Comment: occasional prior to pregnancy     Social History   Substance and Sexual Activity  Drug Use Not Currently   Frequency: 4.0 times per week   Types: Marijuana   Comment: No Delta 9 since  February 2023    Social History   Socioeconomic History   Marital status: Single    Spouse name: Not on file   Number of children: 1   Years of education: 10   Highest education level: 10th grade  Occupational History   Not on file  Tobacco Use   Smoking status: Former    Packs/day: 1.00    Years: 15.00    Total pack years: 15.00    Types: Cigarettes    Quit date: 07/14/2021    Years since quitting: 1.1   Smokeless tobacco: Never   Tobacco comments:    only smoke a "couple" of cigarettes when stressed or anxious, socially with friends per [facility] chart  Vaping Use   Vaping Use: Former   Start date: 08/10/2003   Quit date: 05/04/2021  Substance and Sexual Activity   Alcohol use: Not Currently    Comment: occasional prior to pregnancy   Drug use: Not Currently    Frequency: 4.0 times per week    Types: Marijuana    Comment: No Delta 9 since February 2023   Sexual activity: Not Currently    Partners: Male    Birth control/protection: None  Other Topics Concern   Not on file  Social History Narrative   Not on file   Social Determinants of Health   Financial Resource Strain: Not on file  Food Insecurity: No  Food Insecurity (08/31/2022)   Hunger Vital Sign    Worried About Running Out of Food in the Last Year: Never true    Ran Out of Food in the Last Year: Never true  Transportation Needs: No Transportation Needs (08/31/2022)   PRAPARE - Administrator, Civil Service (Medical): No    Lack of Transportation (Non-Medical): No  Physical Activity: Not on file  Stress: Not on file  Social Connections: Not on file   Hospital Course: (Per admission evaluation lists): 27 year old Caucasian female. Sharon Ward was recently admitted, treated, stabilized & discharged from this [facility] last month. At the time, she was admitted for worsening symptoms of depression after an overdose incident. She cited as her stress then a call she received from the DSS that her children will remain in  the foster care system. She is admitted to the [facility] this time around with complaint of worsening suicidal & homicidal thoughts towards her husband & her children. She reported that her plan was to overdose on her medications. After medical evaluation/clearance, Sharon Ward was transferred to the [facility] for further evaluation/treatments.   This is one of several psychiatric discharge summaries for this 27 year old Caucasian female with hx of chronic depression, multiple psychiatric admissions & probable cannabis use disorder She is known in this [facility] for worsening symptoms of bipolar disorder, suicidal ideations & multiple suicide attempts. Sharon Ward was brought to the [facility] this time around for evaluation & treatment for worsening suicidal & homicidal thoughts towards her husband & her children. Patient has been receiving mental health care on an outpatient basis, has an Act Team & a therapist. 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  After evaluation of her presenting symptoms, Sharon Ward was recommended for mood stabilization treatments. The medication regimen for her presenting symptoms were discussed & with her consent initiated. She received, stabilized & was discharged on the medications as listed below on her discharge medication lists. She was also enrolled & participated in the group counseling sessions being offered & held on this unit. She learned coping skills. She presented other pre-existing medical condition that required treatment & monitoring. She was resumed/discharged on all her pertinent home medications for those health issues. She tolerated her treatment regimen without any adverse effects or reactions reported.  Sharon Ward's symptoms responded well to her treatment regime warranting this discharge. During the course of her hospitalization, the 15-minute checks were adequate to ensure Sharon Ward's safety.  Patient did not display any dangerous, violent or suicidal behavior on the  unit.  She interacted with patients & staff appropriately, participated appropriately in the group sessions/therapies. Her medications were addressed & adjusted to meet her needs. She was recommended for outpatient follow-up care & medication management with her Act Team upon discharge to assure her continuity of care.  At the time of discharge patient is not reporting any acute suicidal/homicidal ideations. She stated that she is happy to go home to see about her new born daughter that has been placed in a foster care system. She currently denies any new issues or concerns. Education and supportive counseling provided throughout her hospital stay & upon discharge.  Today upon her discharge evaluation with her treatment team, Maitri shares she is doing well. She denies any other specific concerns. She is sleeping well. Her appetite is good. She denies other physical complaints. She denies AH/VH, delusional thoughts or paranoia. She does not appear to be responding to any internal stimuli. She feels that  her medications have been helpful & is in agreement to continue her current treatment regimen as recommended. She was able to engage in safety planning including plan to return to [facility] or contact emergency services if she feels unable to maintain her own safety or the safety of others. Pt had no further questions, comments, or concerns. She left [facility] with all personal belongings in no apparent distress. Transportation per the city bus. [place] assisted with bus pass..  Physical Findings: AIMS: Facial and Oral Movements Muscles of Facial Expression: None, normal Lips and Perioral Area: None, normal Jaw: None, normal Tongue: None, normal,Extremity Movements Upper (arms, wrists, hands, fingers): None, normal Lower (legs, knees, ankles, toes): None, normal, Trunk Movements Neck, shoulders, hips: None, normal, Overall Severity Severity of abnormal movements (highest score from questions above): None,  normal Incapacitation due to abnormal movements: None, normal Patient's awareness of abnormal movements (rate only patient's report): No Awareness, Dental Status Current problems with teeth and/or dentures?: No Does patient usually wear dentures?: No  CIWA:    COWS:     Musculoskeletal: Strength & Muscle Tone: within normal limits Gait & Station: normal Patient leans: N/A   Psychiatric Specialty Exam: Presentation  General Appearance:  Appropriate for Environment; Casual; Fairly Groomed  Eye Contact: Good  Speech: Clear and Coherent; Normal Rate  Speech Volume: Normal  Handedness: Right   Mood and Affect  Mood: Euthymic  Affect: Appropriate; Congruent  Thought Process  Thought Processes: Coherent; Goal Directed; Linear  Descriptions of Associations:Intact  Orientation:Full (Time, Place and Person)  Thought Content:Logical  History of Schizophrenia/Schizoaffective disorder:No  Duration of Psychotic Symptoms:N/A  Hallucinations:Hallucinations: None  Ideas of Reference:None  Suicidal Thoughts:Suicidal Thoughts: No SI Active Intent and/or Plan: Without Intent; Without Plan; Without Means to Carry Out; Without Access to Means  Homicidal Thoughts:No data recorded  Sensorium  Memory: Immediate Good; Recent Good; Remote Good  Judgment: Good  Insight: Good   Executive Functions  Concentration: Good  Attention Span: Good  Recall: Good  Fund of Knowledge: Good  Language: Good  Psychomotor Activity  Psychomotor Activity: Psychomotor Activity: Normal  Assets  Assets: Communication Skills; Desire for Improvement; Housing; Physical Health; Resilience; Social Support  Sleep  Sleep: Sleep: Good Number of Hours of Sleep: 7.5  Physical Exam: Physical Exam Vitals and nursing note reviewed.  HENT:     Head: Normocephalic.     Nose: Nose normal.     Mouth/Throat:     Pharynx: Oropharynx is clear.  Eyes:     Pupils: Pupils are  equal, round, and reactive to light.  Cardiovascular:     Rate and Rhythm: Normal rate.     Pulses: Normal pulses.  Pulmonary:     Effort: Pulmonary effort is normal.  Genitourinary:    Comments: Deferred Musculoskeletal:        General: Normal range of motion.     Cervical back: Normal range of motion.  Skin:    General: Skin is warm and dry.  Neurological:     General: No focal deficit present.     Mental Status: She is alert and oriented to person, place, and time. Mental status is at baseline.    Review of Systems  Constitutional:  Negative for chills, diaphoresis and fever.  HENT:  Negative for congestion and sore throat.   Eyes:  Negative for blurred vision.  Respiratory:  Negative for cough, shortness of breath and wheezing.   Cardiovascular:  Negative for chest pain and palpitations.  Gastrointestinal:  Negative for  abdominal pain, constipation, diarrhea, heartburn, nausea and vomiting.  Genitourinary:  Negative for dysuria.  Musculoskeletal:  Negative for joint pain and myalgias.  Skin:  Negative for itching and rash.  Neurological:  Negative for dizziness, tingling, tremors, sensory change, speech change, focal weakness, seizures, loss of consciousness, weakness and headaches.  Endo/Heme/Allergies:        See allergy lists.  Psychiatric/Behavioral:  Positive for depression (Hx of (stable on medication).). Negative for hallucinations, memory loss, substance abuse and suicidal ideas (Hx. chronic suicidality - stable upon discharge.). The patient has insomnia (Hx of (stable on medication).). The patient is not nervous/anxious (Stable upon discharge.).    Blood pressure 110/76, pulse 85, temperature 98.5 F (36.9 C), temperature source Oral, resp. rate 20, height 5\' 3"  (1.6 m), weight 97.5 kg, SpO2 100 %, not currently breastfeeding. Body mass index is 38.08 kg/m.   Social History   Tobacco Use  Smoking Status Former   Packs/day: 1.00   Years: 15.00   Total pack  years: 15.00   Types: Cigarettes   Quit date: 07/14/2021   Years since quitting: 1.1  Smokeless Tobacco Never  Tobacco Comments   only smoke a "couple" of cigarettes when stressed or anxious, socially with friends per [facility] chart   Tobacco Cessation:  An FDA-approved tobacco cessation medication recommended at discharge  Blood Alcohol level:  Lab Results  Component Value Date   [facility] <10 08/30/2022   ETH <10 08/21/2022   Metabolic Disorder Labs:  Lab Results  Component Value Date   HGBA1C 5.4 07/13/2022   MPG 108 07/13/2022   MPG 91 11/10/2021   Lab Results  Component Value Date   PROLACTIN 48.1 (H) 11/10/2021   Lab Results  Component Value Date   CHOL 206 (H) 07/13/2022   TRIG 60 07/13/2022   HDL 65 07/13/2022   CHOLHDL 3.2 07/13/2022   VLDL 12 07/13/2022   LDLCALC 129 (H) 07/13/2022   LDLCALC 93 11/10/2021   See Psychiatric Specialty Exam and Suicide Risk Assessment completed by Attending Physician prior to discharge.  Discharge destination:  Home  Is patient on multiple antipsychotic therapies at discharge:  No   Has Patient had three or more failed trials of antipsychotic monotherapy by history:  No  Recommended Plan for Multiple Antipsychotic Therapies: NA  Allergies as of 09/03/2022       Reactions   Ascorbate Rash   Citrus Rash   Coconut Flavor Rash   Lamotrigine Rash   Orange (diagnostic) Rash   Peach Flavor Rash   Pear Rash   Pineapple Rash        Medication List     TAKE these medications      Indication  amLODipine 5 MG tablet Commonly known as: NORVASC Take 1 tablet (5 mg total) by mouth daily. For high blood pressure What changed: additional instructions  Indication: High Blood Pressure Disorder   ARIPiprazole 10 MG tablet Commonly known as: ABILIFY Take 1 tablet (10 mg total) by mouth at bedtime. For mood stability What changed: additional instructions  Indication: MIXED BIPOLAR AFFECTIVE DISORDER   FLUoxetine 20 MG  capsule Commonly known as: PROZAC Take 1 capsule (20 mg total) by mouth daily. For depression Start taking on: September 04, 2022  Indication: Generalized Anxiety Disorder, Major Depressive Disorder   hydrOXYzine 25 MG tablet Commonly known as: ATARAX Take 1 tablet (25 mg total) by mouth 3 (three) times daily as needed for anxiety.  Indication: Feeling Anxious   Melatonin 10 MG Tabs Take  10 mg by mouth at bedtime. For sleep What changed: additional instructions  Indication: Insomnia.   nicotine 14 mg/24hr patch Commonly known as: NICODERM CQ - dosed in mg/24 hours Place 1 patch (14 mg total) onto the skin daily. (May buy from over the counter): For smoking cessation. Start taking on: September 04, 2022  Indication: Nicotine Addiction        [place]. Call.   Specialty: Behavioral Health Why: Please call to confirm an appointment with this provider on  09/06/22 at 12:30 pm. Contact information: [place] [address] [phone]         [facility] Follow up on 09/04/2022.   Why: Please follow-up with your ACT Team after discharge. You may also contact the Crisis Line at any time. Contact information: [place] [address] [phone]                Follow-up recommendations: Activity:  As tolerated Diet: As recommended by your primary care doctor. Keep all scheduled follow-up appointments as recommended.   Comments: Comments: Patient is recommended to follow-up care on an outpatient basis as noted above. Prescriptions sent to pt's pharmacy of choice at discharge.   Patient agreeable to plan.   Given opportunity to ask questions.   Appears to feel comfortable with discharge denies any current suicidal or homicidal thought. Patient is also instructed prior to discharge to: Take all medications as prescribed by his/her mental healthcare  provider. Report any adverse effects and or reactions from the medicines to his/her outpatient provider promptly. Patient has been instructed & cautioned: To not engage in alcohol and or illegal drug use while on prescription medicines. In the event of worsening symptoms, patient is instructed to call the crisis hotline, 911 and or go to the nearest ED for appropriate evaluation and treatment of symptoms. To follow-up with his/her primary care provider for your other medical issues, concerns and or health care needs.  Signed: Lindell Spar, NP, pmhnp, fnp-bc. 09/03/2022, 10:16 AM

## 2022-09-03 NOTE — Progress Notes (Signed)
  [facility] Adult Case Management Discharge Plan :  Will you be returning to the same living situation after discharge:  Yes,  Own home  At discharge, do you have transportation home?: Yes,  Bus Passes Do you have the ability to pay for your medications: Yes,  Medicaid   Release of information consent forms completed and in the chart;  Patient's signature needed at discharge.  Patient to Follow up at:  Follow-up [place], [facility]. Call.   Specialty: Behavioral Health Why: Please call to confirm an appointment with this provider on  09/06/22 at 12:30 pm. Contact information: [place] [address] [phone]         [facility] Follow up on 09/04/2022.   Why: Please follow-up with your ACT Team after discharge. You may also contact the Crisis Line at any time. Contact information: [place] [address] [phone]                 Next level of care provider has access to [place] and Suicide Prevention discussed: Yes,  with patient and child's father      Has patient been referred to the Quitline?: N/A patient is not a smoker  Patient has been referred for addiction treatment: N/A  Darleen Crocker, [place] 09/03/2022, 9:55 AM

## 2022-09-03 NOTE — Progress Notes (Signed)
Patient verbalizes readiness for discharge. All patient belongings returned to patient. Discharge instructions read and discussed with patient (appointments, medications, resources). Patient expressed gratitude for care provided. Patient discharged to lobby at 1125. 

## 2022-09-03 NOTE — BHH Counselor (Signed)
CSW contacted the Pt's ACT Team with [facility] and informed them that the Pt is discharging today.  The ACT Team states that they will follow-up with the Pt in her home after discharge.

## 2022-09-16 ENCOUNTER — Ambulatory Visit (INDEPENDENT_AMBULATORY_CARE_PROVIDER_SITE_OTHER): Payer: Medicaid Other | Admitting: Obstetrics and Gynecology

## 2022-09-16 VITALS — BP 108/45 | HR 53 | Wt 213.4 lb

## 2022-09-16 DIAGNOSIS — I998 Other disorder of circulatory system: Secondary | ICD-10-CM

## 2022-09-16 DIAGNOSIS — Z975 Presence of (intrauterine) contraceptive device: Secondary | ICD-10-CM | POA: Diagnosis not present

## 2022-09-16 DIAGNOSIS — R9431 Abnormal electrocardiogram [ECG] [EKG]: Secondary | ICD-10-CM | POA: Diagnosis not present

## 2022-09-16 NOTE — Progress Notes (Signed)
GYNECOLOGY VISIT  [name] MRN [id number]  Date of birth: Sep 18, 1995 Chief Complaint:   Follow-up  History:  Sharon Ward is a 27 y.o. [id number] being seen today for follow up of IUD. Has not had issues with IUD in place. Had a menses and it was not too bad. Feeling well with IUD in place.     Past Medical History:  Diagnosis Date   Asthma    Bipolar 1 disorder, mixed ([place])    Depression    Generalized anxiety disorder    Intentional drug overdose ([place]) 11/03/2020   Low-lying placenta 01/07/2022   Resolved 03/04/22   Relationship dysfunction    Suicide attempt ([place]) 11/02/2020    Past Surgical History:  Procedure Laterality Date   PILONIDAL CYST / SINUS EXCISION  09/11/2013   PILONIDAL CYST EXCISION  05/17/2014   Pilonidal cystectomy with cleft lip    The following portions of the patient's history were reviewed and updated as appropriate: allergies, current medications, past family history, past medical history, past social history, past surgical history and problem list.   Health Maintenance:   Last pap     Component Value Date/Time   DIAGPAP  08/18/2022 1523    - Negative for intraepithelial lesion or malignancy (NILM)   [place] Negative 08/18/2022 1523   ADEQPAP  08/18/2022 1523    Satisfactory for evaluation; transformation zone component PRESENT.   Last mammogram: n/a   Review of Systems:  Pertinent items are noted in HPI. Comprehensive review of systems was otherwise negative.   Objective:  Physical Exam BP (!) 108/45   Pulse (!) 53   Wt 213 lb 6.4 oz (96.8 kg)   BMI 37.80 kg/m    Physical Exam Vitals and nursing note reviewed. Exam conducted with a chaperone present.  Constitutional:      Appearance: Normal appearance.  HENT:     Head: Normocephalic and atraumatic.  Pulmonary:     Effort: Pulmonary effort is normal.     Breath sounds: Normal breath sounds.  Genitourinary:    General: Normal vulva.     Exam position:  Lithotomy position.     Vagina: Normal.     Cervix: Normal.     Comments: IUD strings visualized Skin:    General: Skin is warm and dry.  Neurological:     General: No focal deficit present.     Mental Status: She is alert.  Psychiatric:        Mood and Affect: Mood normal.        Behavior: Behavior normal.        Thought Content: Thought content normal.        Judgment: Judgment normal.      Labs and Imaging No results found.     Assessment & Plan:   1. IUD (intrauterine device) in place IUD remains in place, no evidence of expulsion at this time. IUD to remain in place until time for removal or sooner. Reviewed there is still possibility of expulsion due to history and to continue to monitor, but reassuring IUD remains in place as prior IUDs expelled by this point previously.   2. Blood pressure instability Reports hx of issues with blood pressure and previously followed by a provider but no longer has a PCP - requesting PCP referral to follow up.   3. Prolonged QT interval - Ambulatory Referral to Primary Care  Routine preventative health maintenance measures emphasized.  Darliss Cheney, MD [facility]  for [employer], [place]

## 2022-10-04 ENCOUNTER — Ambulatory Visit (HOSPITAL_COMMUNITY)
Admission: EM | Admit: 2022-10-04 | Discharge: 2022-10-05 | Disposition: A | Discharge status: Home / Self Care | Payer: Medicaid Other | Attending: Psychiatry | Admitting: Psychiatry

## 2022-10-04 DIAGNOSIS — R4589 Other symptoms and signs involving emotional state: Secondary | ICD-10-CM | POA: Diagnosis not present

## 2022-10-04 DIAGNOSIS — F339 Major depressive disorder, recurrent, unspecified: Secondary | ICD-10-CM | POA: Insufficient documentation

## 2022-10-04 DIAGNOSIS — Z1152 Encounter for screening for COVID-19: Secondary | ICD-10-CM

## 2022-10-04 DIAGNOSIS — F410 Panic disorder [episodic paroxysmal anxiety] without agoraphobia: Secondary | ICD-10-CM

## 2022-10-04 LAB — POCT URINE DRUG SCREEN - MANUAL ENTRY (I-SCREEN)
POC Amphetamine UR: NOT DETECTED
POC Buprenorphine (BUP): NOT DETECTED
POC Cocaine UR: NOT DETECTED
POC Marijuana UR: NOT DETECTED
POC Methadone UR: NOT DETECTED
POC Methamphetamine UR: NOT DETECTED
POC Morphine: NOT DETECTED
POC Oxazepam (BZO): NOT DETECTED
POC Oxycodone UR: NOT DETECTED
POC Secobarbital (BAR): NOT DETECTED

## 2022-10-04 LAB — COMPREHENSIVE METABOLIC PANEL
ALT: 26 U/L (ref 0–44)
AST: 23 U/L (ref 15–41)
Albumin: 4.2 g/dL (ref 3.5–5.0)
Alkaline Phosphatase: 63 U/L (ref 38–126)
Anion gap: 11 (ref 5–15)
BUN: 11 mg/dL (ref 6–20)
CO2: 26 mmol/L (ref 22–32)
Calcium: 10.1 mg/dL (ref 8.9–10.3)
Chloride: 103 mmol/L (ref 98–111)
Creatinine, Ser: 0.84 mg/dL (ref 0.44–1.00)
GFR, Estimated: >60 mL/min (ref >60)
Glucose, Bld: 95 mg/dL (ref 70–99)
Potassium: 4.1 mmol/L (ref 3.5–5.1)
Sodium: 140 mmol/L (ref 135–145)
Total Bilirubin: 0.3 mg/dL (ref 0.3–1.2)
Total Protein: 6.9 g/dL (ref 6.5–8.1)

## 2022-10-04 LAB — CBC WITH DIFFERENTIAL/PLATELET
Abs Immature Granulocytes: 0.01 10*3/uL (ref 0.00–0.07)
Basophils Absolute: 0.1 10*3/uL (ref 0.0–0.1)
Basophils Relative: 1 %
Eosinophils Absolute: 0.1 10*3/uL (ref 0.0–0.5)
Eosinophils Relative: 1 %
HCT: 40.3 % (ref 36.0–46.0)
Hemoglobin: 13.9 g/dL (ref 12.0–15.0)
Immature Granulocytes: 0 %
Lymphocytes Relative: 30 %
Lymphs Abs: 2.5 10*3/uL (ref 0.7–4.0)
MCH: 31 pg (ref 26.0–34.0)
MCHC: 34.5 g/dL (ref 30.0–36.0)
MCV: 89.8 fL (ref 80.0–100.0)
Monocytes Absolute: 0.6 10*3/uL (ref 0.1–1.0)
Monocytes Relative: 7 %
Neutro Abs: 5.2 10*3/uL (ref 1.7–7.7)
Neutrophils Relative %: 61 %
Platelets: 255 10*3/uL (ref 150–400)
RBC: 4.49 MIL/uL (ref 3.87–5.11)
RDW: 12.3 % (ref 11.5–15.5)
WBC: 8.6 10*3/uL (ref 4.0–10.5)
nRBC: 0 % (ref 0.0–0.2)

## 2022-10-04 LAB — RESP PANEL BY RT-PCR (RSV, FLU A&B, COVID)  RVPGX2
Influenza A by PCR: NEGATIVE
Influenza B by PCR: NEGATIVE
Resp Syncytial Virus by PCR: NEGATIVE
SARS Coronavirus 2 by RT PCR: NEGATIVE

## 2022-10-04 LAB — TSH: TSH: 2.637 u[IU]/mL (ref 0.350–4.500)

## 2022-10-04 LAB — POC SARS CORONAVIRUS 2 AG: SARSCOV2ONAVIRUS 2 AG: NEGATIVE

## 2022-10-04 LAB — POCT PREGNANCY, URINE: Preg Test, Ur: NEGATIVE

## 2022-10-04 MED ORDER — OLANZAPINE 5 MG PO TBDP: 5.0000 mg | ORAL_TABLET | Freq: Three times a day (TID) | ORAL | Status: DC | PRN

## 2022-10-04 MED ORDER — HYDROXYZINE HCL 25 MG PO TABS
25.0000 mg | ORAL_TABLET | Freq: Three times a day (TID) | ORAL | Status: DC | PRN
  Administered 2022-10-04: 25 mg via ORAL
  Filled 2022-10-04: qty 1

## 2022-10-04 MED ORDER — ACETAMINOPHEN 325 MG PO TABS: 650.0000 mg | ORAL_TABLET | Freq: Four times a day (QID) | ORAL | Status: DC | PRN

## 2022-10-04 MED ORDER — FLUOXETINE HCL 20 MG PO CAPS
20.0000 mg | ORAL_CAPSULE | Freq: Every day | ORAL | Status: DC
  Administered 2022-10-04: 20 mg via ORAL
  Filled 2022-10-04: qty 1

## 2022-10-04 MED ORDER — MELATONIN 5 MG PO TABS
10.0000 mg | ORAL_TABLET | Freq: Every day | ORAL | Status: DC
  Administered 2022-10-04: 10 mg via ORAL
  Filled 2022-10-04: qty 2

## 2022-10-04 MED ORDER — NICOTINE 14 MG/24HR TD PT24
14.0000 mg | MEDICATED_PATCH | Freq: Every day | TRANSDERMAL | Status: DC
  Filled 2022-10-04: qty 1

## 2022-10-04 MED ORDER — ALUM & MAG HYDROXIDE-SIMETH 200-200-20 MG/5ML PO SUSP: 30.0000 mL | ORAL | Status: DC | PRN

## 2022-10-04 MED ORDER — LORAZEPAM 1 MG PO TABS: 1.0000 mg | ORAL_TABLET | ORAL | Status: DC | PRN

## 2022-10-04 MED ORDER — AMLODIPINE BESYLATE 5 MG PO TABS
5.0000 mg | ORAL_TABLET | Freq: Every day | ORAL | Status: DC
  Administered 2022-10-04: 5 mg via ORAL
  Filled 2022-10-04: qty 1

## 2022-10-04 MED ORDER — ARIPIPRAZOLE 10 MG PO TABS
10.0000 mg | ORAL_TABLET | Freq: Every day | ORAL | Status: DC
  Administered 2022-10-04: 10 mg via ORAL
  Filled 2022-10-04: qty 1

## 2022-10-04 MED ORDER — ZIPRASIDONE MESYLATE 20 MG IM SOLR: 20.0000 mg | INTRAMUSCULAR | Status: DC | PRN

## 2022-10-04 MED ORDER — MAGNESIUM HYDROXIDE 400 MG/5ML PO SUSP: 30.0000 mL | Freq: Every day | ORAL | Status: DC | PRN

## 2022-10-04 NOTE — ED Notes (Signed)
Pt is in assessment room 134.
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
## 2022-10-04 NOTE — ED Notes (Signed)
Pt admitted to [facility] endorsing panic attacks and racing thoughts of harming herself. Denies HI, AVH, ETOH abuse and opioid  abuse.Patient was cooperative during the admission assessment but tearful. Skin assessment complete. Belongings inventoried. Patient oriented to unit and unit rules. Pt refused meal offered. Patient verbalized agreement to treatment plans. Patient verbally contracts for safety while hospitalized. Will monitor for safety.

## 2022-10-04 NOTE — ED Provider Notes (Signed)
[facility] Urgent Care Continuous Assessment Admission H&P  Date: 10/05/22 Patient Name: Nyanna Baab MRN: [id number] Chief Complaint: increase Panic attack  Diagnoses:  Final diagnoses:  Panic attack  Recurrent major depressive disorder, remission status unspecified ([place])  Ineffective coping    HPI: [name],  27 y/o female with a history of bipolar disorder, intentional overdose, suicidal ideation.  Presented to [facility] via [facility] voluntarily.  Per the patient she has been having increased anxiety especially today.  When asked what is increasing her anxiety patient says she does not know according to patient she is going through a lot.  Per the patient her kids were taken from her by DSS a month ago because her boyfriend left the kids and went across the street while she was in the hospital.  Patient report she does take her medicine every day during the evenings.  Patient currently lives alone.  Per the patient her boyfriend is currently mad at her.  Patient also stated she does have suicidal thoughts.  Copied from triage notes: Pt presents to [facility] voluntarily, accompanied by [employer] with complaint of suicidal thoughts with no plan. Pt reports feeling anxious and having a panic attack due to her living arrangements. Pt also reports having an argument with her child's father and DSS involvement that triggered her thoughts of wanting to hurt herself. Pt stated history of Bipolar, Depression and anxiety and is currently prescribed Abilify, in which she is compliant at this time. Pt reports having an ACTT team, but feels that her mental health is not always taken seriously. Pt has significant history of suicide attempts and prior psychiatric hospitalizations. Pt currently denies HI, AVH and substance/alcohol use.   Face-to-face observation of patient, patient is alert and oriented x 4, speech is clear, maintaining eye contact.  Patient can become tearful at times.  Patient appearance is casual, patient mood is  depressed, affect is flat congruent with mood.  Patient endorsed suicide thoughts but she had some suicidal thoughts today.  When asked if she she has any plans patient stated no.  Patient stated she wanted to check herself in the hospital because this is too much for her.  Patient denies HI, AVH or paranoia at this time.  Patient reports she last drink probably a month ago, patient reports she does not smoke or use any illicit drugs recently.  According to patient the only thing she use a week ago was delta 8.   Recommend inpatient observation  Total Time spent with patient: 20 minutes  Musculoskeletal  Strength & Muscle Tone: within normal limits Gait & Station: normal Patient leans: N/A  Psychiatric Specialty Exam  Presentation General Appearance:  Casual  Eye Contact: Good  Speech: Clear and Coherent  Speech Volume: Normal  Handedness: Right   Mood and Affect  Mood: Anxious; Depressed; Hopeless; Worthless  Affect: Appropriate   Thought Process  Thought Processes: Coherent  Descriptions of Associations:Circumstantial  Orientation:Full (Time, Place and Person)  Thought Content:Logical  Diagnosis of Schizophrenia or Schizoaffective disorder in past: No   Hallucinations:Hallucinations: None  Ideas of Reference:None  Suicidal Thoughts:Suicidal Thoughts: Yes, Passive SI Active Intent and/or Plan: Without Intent; Without Plan SI Passive Intent and/or Plan: Without Plan; Without Intent  Homicidal Thoughts:Homicidal Thoughts: No   Sensorium  Memory: Immediate Good  Judgment: Fair  Insight: Fair   Executive Functions  Concentration: Good  Attention Span: Good  Recall: Good  Fund of Knowledge: Good  Language: Good   Psychomotor Activity  Psychomotor Activity: Psychomotor Activity:  Normal   Assets  Assets: Desire for Improvement   Sleep  Sleep: Sleep: Fair Number of Hours of Sleep: 7   Nutritional Assessment (For OBS and  FBC admissions only) Has the patient had a weight loss or gain of 10 pounds or more in the last 3 months?: No Has the patient had a decrease in food intake/or appetite?: No Does the patient have dental problems?: No Does the patient have eating habits or behaviors that may be indicators of an eating disorder including binging or inducing vomiting?: No Has the patient recently lost weight without trying?: 0 Has the patient been eating poorly because of a decreased appetite?: 0 Malnutrition Screening Tool Score: 0    Physical Exam HENT:     Head: Normocephalic.     Nose: Nose normal.  Cardiovascular:     Rate and Rhythm: Normal rate.  Pulmonary:     Effort: Pulmonary effort is normal.  Musculoskeletal:        General: Normal range of motion.     Cervical back: Normal range of motion.  Skin:    General: Skin is warm.  Neurological:     General: No focal deficit present.     Mental Status: She is alert.  Psychiatric:        Mood and Affect: Mood normal.        Behavior: Behavior normal.        Thought Content: Thought content normal.        Judgment: Judgment normal.    Review of Systems  Constitutional: Negative.   HENT: Negative.    Skin: Negative.     Blood pressure (!) 139/97, pulse 85, temperature 98.9 F (37.2 C), temperature source Oral, resp. rate 20, SpO2 98 %, not currently breastfeeding. There is no height or weight on file to calculate BMI.  Past Psychiatric History: Bipolar disorder, depression, suicidal ideation, OD.  Is the patient at risk to self? Yes  Has the patient been a risk to self in the past 6 months? Yes .    Has the patient been a risk to self within the distant past? Yes   Is the patient a risk to others? No   Has the patient been a risk to others in the past 6 months? No   Has the patient been a risk to others within the distant past? No   Past Medical History: see chart   Family History: unknown   Social History: unknown`  Last Labs:   Admission on 10/04/2022  Component Date Value Ref Range Status   SARS Coronavirus 2 by RT PCR 10/04/2022 NEGATIVE  NEGATIVE Final   Influenza A by PCR 10/04/2022 NEGATIVE  NEGATIVE Final   Influenza B by PCR 10/04/2022 NEGATIVE  NEGATIVE Final   Comment: (NOTE) The Xpert Xpress SARS-CoV-2/FLU/RSV plus assay is intended as an aid in the diagnosis of influenza from Nasopharyngeal swab specimens and should not be used as a sole basis for treatment. Nasal washings and aspirates are unacceptable for Xpert Xpress SARS-CoV-2/FLU/RSV testing.  Fact Sheet for Patients: EntrepreneurPulse.com.au  Fact Sheet for Healthcare Providers: IncredibleEmployment.be  This test is not yet approved or cleared by the [place] FDA and has been authorized for detection and/or diagnosis of SARS-CoV-2 by FDA under an Emergency Use Authorization (EUA). This EUA will remain in effect (meaning this test can be used) for the duration of the COVID-19 declaration under Section 564(b)(1) of the Act, 21 U.S.C. section 360bbb-3(b)(1), unless the authorization is terminated or  revoked.     Resp Syncytial Virus by PCR 10/04/2022 NEGATIVE  NEGATIVE Final   Comment: (NOTE) Fact Sheet for Patients: EntrepreneurPulse.com.au  Fact Sheet for Healthcare Providers: IncredibleEmployment.be  This test is not yet approved or cleared by the [place] FDA and has been authorized for detection and/or diagnosis of SARS-CoV-2 by FDA under an Emergency Use Authorization (EUA). This EUA will remain in effect (meaning this test can be used) for the duration of the COVID-19 declaration under Section 564(b)(1) of the Act, 21 U.S.C. section 360bbb-3(b)(1), unless the authorization is terminated or revoked.  Performed at [facility], [place] [address] [place], [place] [address]    WBC 10/04/2022 8.6  4.0 - 10.5 K/uL Final   RBC 10/04/2022 4.49   3.87 - 5.11 MIL/uL Final   Hemoglobin 10/04/2022 13.9  12.0 - 15.0 g/dL Final   HCT 10/04/2022 40.3  36.0 - 46.0 % Final   MCV 10/04/2022 89.8  80.0 - 100.0 fL Final   MCH 10/04/2022 31.0  26.0 - 34.0 pg Final   MCHC 10/04/2022 34.5  30.0 - 36.0 g/dL Final   RDW 10/04/2022 12.3  11.5 - 15.5 % Final   Platelets 10/04/2022 255  150 - 400 K/uL Final   nRBC 10/04/2022 0.0  0.0 - 0.2 % Final   Neutrophils Relative % 10/04/2022 61  % Final   Neutro Abs 10/04/2022 5.2  1.7 - 7.7 K/uL Final   Lymphocytes Relative 10/04/2022 30  % Final   Lymphs Abs 10/04/2022 2.5  0.7 - 4.0 K/uL Final   Monocytes Relative 10/04/2022 7  % Final   Monocytes Absolute 10/04/2022 0.6  0.1 - 1.0 K/uL Final   Eosinophils Relative 10/04/2022 1  % Final   Eosinophils Absolute 10/04/2022 0.1  0.0 - 0.5 K/uL Final   Basophils Relative 10/04/2022 1  % Final   Basophils Absolute 10/04/2022 0.1  0.0 - 0.1 K/uL Final   Immature Granulocytes 10/04/2022 0  % Final   Abs Immature Granulocytes 10/04/2022 0.01  0.00 - 0.07 K/uL Final   Performed at [facility], [place] [address], [place], [place] [address]   Sodium 10/04/2022 140  135 - 145 mmol/L Final   Potassium 10/04/2022 4.1  3.5 - 5.1 mmol/L Final   Chloride 10/04/2022 103  98 - 111 mmol/L Final   CO2 10/04/2022 26  22 - 32 mmol/L Final   Glucose, Bld 10/04/2022 95  70 - 99 mg/dL Final   Glucose reference range applies only to samples taken after fasting for at least 8 hours.   BUN 10/04/2022 11  6 - 20 mg/dL Final   Creatinine, Ser 10/04/2022 0.84  0.44 - 1.00 mg/dL Final   Calcium 10/04/2022 10.1  8.9 - 10.3 mg/dL Final   Total Protein 10/04/2022 6.9  6.5 - 8.1 g/dL Final   Albumin 10/04/2022 4.2  3.5 - 5.0 g/dL Final   AST 10/04/2022 23  15 - 41 U/L Final   ALT 10/04/2022 26  0 - 44 U/L Final   Alkaline Phosphatase 10/04/2022 63  38 - 126 U/L Final   Total Bilirubin 10/04/2022 0.3  0.3 - 1.2 mg/dL Final   GFR, Estimated 10/04/2022 >60  >60 mL/min Final    Comment: (NOTE) Calculated using the CKD-EPI Creatinine Equation (2021)    Anion gap 10/04/2022 11  5 - 15 Final   Performed at [facility], [place] [address], [place], [place] [address]   TSH 10/04/2022 2.637  0.350 -  4.500 uIU/mL Final   Comment: Performed by a 3rd Generation assay with a functional sensitivity of <=0.01 uIU/mL. Performed at [facility], [place] [address], [place], [place] [address]    POC Amphetamine UR 10/04/2022 None Detected  NONE DETECTED (Cut Off Level 1000 ng/mL) Final   POC Secobarbital (BAR) 10/04/2022 None Detected  NONE DETECTED (Cut Off Level 300 ng/mL) Final   POC Buprenorphine (BUP) 10/04/2022 None Detected  NONE DETECTED (Cut Off Level 10 ng/mL) Final   POC Oxazepam (BZO) 10/04/2022 None Detected  NONE DETECTED (Cut Off Level 300 ng/mL) Final   POC Cocaine UR 10/04/2022 None Detected  NONE DETECTED (Cut Off Level 300 ng/mL) Final   POC Methamphetamine UR 10/04/2022 None Detected  NONE DETECTED (Cut Off Level 1000 ng/mL) Final   POC Morphine 10/04/2022 None Detected  NONE DETECTED (Cut Off Level 300 ng/mL) Final   POC Methadone UR 10/04/2022 None Detected  NONE DETECTED (Cut Off Level 300 ng/mL) Final   POC Oxycodone UR 10/04/2022 None Detected  NONE DETECTED (Cut Off Level 100 ng/mL) Final   POC Marijuana UR 10/04/2022 None Detected  NONE DETECTED (Cut Off Level 50 ng/mL) Final   SARSCOV2ONAVIRUS 2 AG 10/04/2022 NEGATIVE  NEGATIVE Final   Comment: (NOTE) SARS-CoV-2 antigen NOT DETECTED.   Negative results are presumptive.  Negative results do not preclude SARS-CoV-2 infection and should not be used as the sole basis for treatment or other patient management decisions, including infection  control decisions, particularly in the presence of clinical signs and  symptoms consistent with COVID-19, or in those who have been in contact with the virus.  Negative results must be combined with clinical observations, patient history, and  epidemiological information. The expected result is Negative.  Fact Sheet for Patients: HandmadeRecipes.com.cy  Fact Sheet for Healthcare Providers: FuneralLife.at  This test is not yet approved or cleared by the [place] FDA and  has been authorized for detection and/or diagnosis of SARS-CoV-2 by FDA under an Emergency Use Authorization (EUA).  This EUA will remain in effect (meaning this test can be used) for the duration of  the COV                          ID-19 declaration under Section 564(b)(1) of the Act, 21 U.S.C. section 360bbb-3(b)(1), unless the authorization is terminated or revoked sooner.     Preg Test, Ur 10/04/2022 NEGATIVE  NEGATIVE Final   Comment:        THE SENSITIVITY OF THIS METHODOLOGY IS >24 mIU/mL   Admission on 08/30/2022, Discharged on 08/30/2022  Component Date Value Ref Range Status   Sodium 08/30/2022 138  135 - 145 mmol/L Final   Potassium 08/30/2022 3.7  3.5 - 5.1 mmol/L Final   Chloride 08/30/2022 105  98 - 111 mmol/L Final   CO2 08/30/2022 26  22 - 32 mmol/L Final   Glucose, Bld 08/30/2022 96  70 - 99 mg/dL Final   Glucose reference range applies only to samples taken after fasting for at least 8 hours.   BUN 08/30/2022 12  6 - 20 mg/dL Final   Creatinine, Ser 08/30/2022 0.75  0.44 - 1.00 mg/dL Final   Calcium 08/30/2022 9.3  8.9 - 10.3 mg/dL Final   Total Protein 08/30/2022 7.8  6.5 - 8.1 g/dL Final   Albumin 08/30/2022 4.3  3.5 - 5.0 g/dL Final   AST 08/30/2022 29  15 - 41 U/L Final   ALT 08/30/2022  36  0 - 44 U/L Final   Alkaline Phosphatase 08/30/2022 73  38 - 126 U/L Final   Total Bilirubin 08/30/2022 0.3  0.3 - 1.2 mg/dL Final   GFR, Estimated 08/30/2022 >60  >60 mL/min Final   Comment: (NOTE) Calculated using the CKD-EPI Creatinine Equation (2021)    Anion gap 08/30/2022 7  5 - 15 Final   Performed at [facility], [place] [address], [place], [place] [address]    Alcohol, Ethyl (B) 08/30/2022 <10  <10 mg/dL Final   Comment: (NOTE) Lowest detectable limit for serum alcohol is 10 mg/dL.  For medical purposes only. Performed at [facility], [place] [address], [place], [place] [id number]    Salicylate Lvl [id number] <7.0 (L)  7.0 - 30.0 mg/dL Final   Performed at [place] [address], [place], [place] [address]   Acetaminophen (Tylenol), Serum 08/30/2022 <10 (L)  10 - 30 ug/mL Final   Comment: (NOTE) Therapeutic concentrations vary significantly. A range of 10-30 ug/mL  may be an effective concentration for many patients. However, some  are best treated at concentrations outside of this range. Acetaminophen concentrations >150 ug/mL at 4 hours after ingestion  and >50 ug/mL at 12 hours after ingestion are often associated with  toxic reactions.  Performed at [facility], [place] [address] [place], [place] [address]    WBC 08/30/2022 8.1  4.0 - 10.5 K/uL Final   RBC 08/30/2022 4.30  3.87 - 5.11 MIL/uL Final   Hemoglobin 08/30/2022 13.2  12.0 - 15.0 g/dL Final   HCT 08/30/2022 39.7  36.0 - 46.0 % Final   MCV 08/30/2022 92.3  80.0 - 100.0 fL Final   MCH 08/30/2022 30.7  26.0 - 34.0 pg Final   MCHC 08/30/2022 33.2  30.0 - 36.0 g/dL Final   RDW 08/30/2022 13.3  11.5 - 15.5 % Final   Platelets 08/30/2022 269  150 - 400 K/uL Final   nRBC 08/30/2022 0.0  0.0 - 0.2 % Final   Performed at [facility], [place] [address], [place], [place] [address]   Opiates 08/30/2022 NONE DETECTED  NONE DETECTED Final   Cocaine 08/30/2022 NONE DETECTED  NONE DETECTED Final   Benzodiazepines 08/30/2022 NONE DETECTED  NONE DETECTED Final   Amphetamines 08/30/2022 NONE DETECTED  NONE DETECTED Final   Tetrahydrocannabinol 08/30/2022 NONE DETECTED  NONE DETECTED Final   Barbiturates 08/30/2022 NONE DETECTED  NONE DETECTED Final   Comment: (NOTE) DRUG SCREEN FOR MEDICAL PURPOSES ONLY.  IF  CONFIRMATION IS NEEDED FOR ANY PURPOSE, NOTIFY LAB WITHIN 5 DAYS.  LOWEST DETECTABLE LIMITS FOR URINE DRUG SCREEN Drug Class                     Cutoff (ng/mL) Amphetamine and metabolites    1000 Barbiturate and metabolites    200 Benzodiazepine                 200 Opiates and metabolites        300 Cocaine and metabolites        300 THC                            50 Performed at [facility], [place] [address] [place],  [address]    Preg Test, Ur 08/30/2022 NEGATIVE  NEGATIVE Final   Comment:        THE SENSITIVITY OF  THIS METHODOLOGY IS >20 mIU/mL. Performed at [facility], [place] [address], [place], [place] [address]    SARS Coronavirus 2 by RT PCR 08/30/2022 NEGATIVE  NEGATIVE Final   Comment: (NOTE) SARS-CoV-2 target nucleic acids are NOT DETECTED.  The SARS-CoV-2 RNA is generally detectable in upper respiratory specimens during the acute phase of infection. The lowest concentration of SARS-CoV-2 viral copies this assay can detect is 138 copies/mL. A negative result does not preclude SARS-Cov-2 infection and should not be used as the sole basis for treatment or other patient management decisions. A negative result may occur with  improper specimen collection/handling, submission of specimen other than nasopharyngeal swab, presence of viral mutation(s) within the areas targeted by this assay, and inadequate number of viral copies(<138 copies/mL). A negative result must be combined with clinical observations, patient history, and epidemiological information. The expected result is Negative.  Fact Sheet for Patients:  EntrepreneurPulse.com.au  Fact Sheet for Healthcare Providers:  IncredibleEmployment.be  This test is no                          t yet approved or cleared by the [place] FDA and  has been authorized for detection and/or diagnosis of SARS-CoV-2 by FDA under an Emergency  Use Authorization (EUA). This EUA will remain  in effect (meaning this test can be used) for the duration of the COVID-19 declaration under Section 564(b)(1) of the Act, 21 U.S.C.section 360bbb-3(b)(1), unless the authorization is terminated  or revoked sooner.       Influenza A by PCR 08/30/2022 NEGATIVE  NEGATIVE Final   Influenza B by PCR 08/30/2022 NEGATIVE  NEGATIVE Final   Comment: (NOTE) The Xpert Xpress SARS-CoV-2/FLU/RSV plus assay is intended as an aid in the diagnosis of influenza from Nasopharyngeal swab specimens and should not be used as a sole basis for treatment. Nasal washings and aspirates are unacceptable for Xpert Xpress SARS-CoV-2/FLU/RSV testing.  Fact Sheet for Patients: EntrepreneurPulse.com.au  Fact Sheet for Healthcare Providers: IncredibleEmployment.be  This test is not yet approved or cleared by the [place] FDA and has been authorized for detection and/or diagnosis of SARS-CoV-2 by FDA under an Emergency Use Authorization (EUA). This EUA will remain in effect (meaning this test can be used) for the duration of the COVID-19 declaration under Section 564(b)(1) of the Act, 21 U.S.C. section 360bbb-3(b)(1), unless the authorization is terminated or revoked.     Resp Syncytial Virus by PCR 08/30/2022 NEGATIVE  NEGATIVE Final   Comment: (NOTE) Fact Sheet for Patients: EntrepreneurPulse.com.au  Fact Sheet for Healthcare Providers: IncredibleEmployment.be  This test is not yet approved or cleared by the [place] FDA and has been authorized for detection and/or diagnosis of SARS-CoV-2 by FDA under an Emergency Use Authorization (EUA). This EUA will remain in effect (meaning this test can be used) for the duration of the COVID-19 declaration under Section 564(b)(1) of the Act, 21 U.S.C. section 360bbb-3(b)(1), unless the authorization is terminated or revoked.  Performed  at [facility], [place] [address] [place], [place] [address]   Admission on 08/20/2022, Discharged on 08/21/2022  Component Date Value Ref Range Status   SARS Coronavirus 2 by RT PCR 08/21/2022 NEGATIVE  NEGATIVE Final   Comment: (NOTE) SARS-CoV-2 target nucleic acids are NOT DETECTED.  The SARS-CoV-2 RNA is generally detectable in upper respiratory specimens during the acute phase of infection. The lowest concentration of SARS-CoV-2 viral copies this assay can detect is 138  copies/mL. A negative result does not preclude SARS-Cov-2 infection and should not be used as the sole basis for treatment or other patient management decisions. A negative result may occur with  improper specimen collection/handling, submission of specimen other than nasopharyngeal swab, presence of viral mutation(s) within the areas targeted by this assay, and inadequate number of viral copies(<138 copies/mL). A negative result must be combined with clinical observations, patient history, and epidemiological information. The expected result is Negative.  Fact Sheet for Patients:  EntrepreneurPulse.com.au  Fact Sheet for Healthcare Providers:  IncredibleEmployment.be  This test is no                          t yet approved or cleared by the [place] FDA and  has been authorized for detection and/or diagnosis of SARS-CoV-2 by FDA under an Emergency Use Authorization (EUA). This EUA will remain  in effect (meaning this test can be used) for the duration of the COVID-19 declaration under Section 564(b)(1) of the Act, 21 U.S.C.section 360bbb-3(b)(1), unless the authorization is terminated  or revoked sooner.       Influenza A by PCR 08/21/2022 NEGATIVE  NEGATIVE Final   Influenza B by PCR 08/21/2022 NEGATIVE  NEGATIVE Final   Comment: (NOTE) The Xpert Xpress SARS-CoV-2/FLU/RSV plus assay is intended as an aid in the diagnosis of influenza from  Nasopharyngeal swab specimens and should not be used as a sole basis for treatment. Nasal washings and aspirates are unacceptable for Xpert Xpress SARS-CoV-2/FLU/RSV testing.  Fact Sheet for Patients: EntrepreneurPulse.com.au  Fact Sheet for Healthcare Providers: IncredibleEmployment.be  This test is not yet approved or cleared by the [place] FDA and has been authorized for detection and/or diagnosis of SARS-CoV-2 by FDA under an Emergency Use Authorization (EUA). This EUA will remain in effect (meaning this test can be used) for the duration of the COVID-19 declaration under Section 564(b)(1) of the Act, 21 U.S.C. section 360bbb-3(b)(1), unless the authorization is terminated or revoked.     Resp Syncytial Virus by PCR 08/21/2022 NEGATIVE  NEGATIVE Final   Comment: (NOTE) Fact Sheet for Patients: EntrepreneurPulse.com.au  Fact Sheet for Healthcare Providers: IncredibleEmployment.be  This test is not yet approved or cleared by the [place] FDA and has been authorized for detection and/or diagnosis of SARS-CoV-2 by FDA under an Emergency Use Authorization (EUA). This EUA will remain in effect (meaning this test can be used) for the duration of the COVID-19 declaration under Section 564(b)(1) of the Act, 21 U.S.C. section 360bbb-3(b)(1), unless the authorization is terminated or revoked.  Performed at [facility], [place] [address] [place], [place] [address]    WBC 08/21/2022 8.3  4.0 - 10.5 K/uL Final   RBC 08/21/2022 4.68  3.87 - 5.11 MIL/uL Final   Hemoglobin 08/21/2022 14.1  12.0 - 15.0 g/dL Final   HCT 08/21/2022 43.0  36.0 - 46.0 % Final   MCV 08/21/2022 91.9  80.0 - 100.0 fL Final   MCH 08/21/2022 30.1  26.0 - 34.0 pg Final   MCHC 08/21/2022 32.8  30.0 - 36.0 g/dL Final   RDW 08/21/2022 13.4  11.5 - 15.5 % Final   Platelets 08/21/2022 306  150 - 400 K/uL Final   nRBC 08/21/2022  0.0  0.0 - 0.2 % Final   Neutrophils Relative % 08/21/2022 54  % Final   Neutro Abs 08/21/2022 4.6  1.7 - 7.7 K/uL Final   Lymphocytes Relative 08/21/2022 35  % Final  Lymphs Abs 08/21/2022 2.9  0.7 - 4.0 K/uL Final   Monocytes Relative 08/21/2022 6  % Final   Monocytes Absolute 08/21/2022 0.5  0.1 - 1.0 K/uL Final   Eosinophils Relative 08/21/2022 4  % Final   Eosinophils Absolute 08/21/2022 0.3  0.0 - 0.5 K/uL Final   Basophils Relative 08/21/2022 1  % Final   Basophils Absolute 08/21/2022 0.1  0.0 - 0.1 K/uL Final   Immature Granulocytes 08/21/2022 0  % Final   Abs Immature Granulocytes 08/21/2022 0.02  0.00 - 0.07 K/uL Final   Performed at [facility], [place] [address], [place], [place] [address]   Sodium 08/21/2022 140  135 - 145 mmol/L Final   Potassium 08/21/2022 3.7  3.5 - 5.1 mmol/L Final   Chloride 08/21/2022 103  98 - 111 mmol/L Final   CO2 08/21/2022 26  22 - 32 mmol/L Final   Glucose, Bld 08/21/2022 86  70 - 99 mg/dL Final   Glucose reference range applies only to samples taken after fasting for at least 8 hours.   BUN 08/21/2022 10  6 - 20 mg/dL Final   Creatinine, Ser 08/21/2022 0.82  0.44 - 1.00 mg/dL Final   Calcium 08/21/2022 9.7  8.9 - 10.3 mg/dL Final   Total Protein 08/21/2022 6.8  6.5 - 8.1 g/dL Final   Albumin 08/21/2022 4.3  3.5 - 5.0 g/dL Final   AST 08/21/2022 23  15 - 41 U/L Final   ALT 08/21/2022 35  0 - 44 U/L Final   Alkaline Phosphatase 08/21/2022 62  38 - 126 U/L Final   Total Bilirubin 08/21/2022 0.5  0.3 - 1.2 mg/dL Final   GFR, Estimated 08/21/2022 >60  >60 mL/min Final   Comment: (NOTE) Calculated using the CKD-EPI Creatinine Equation (2021)    Anion gap 08/21/2022 11  5 - 15 Final   Performed at [facility] [address] [place], [place] [address]   Alcohol, Ethyl (B) 08/21/2022 <10  <10 mg/dL Final   Comment: (NOTE) Lowest detectable limit for serum alcohol is 10 mg/dL.  For medical purposes only. Performed at [facility], [place] [address] [place], [place] [address]    POC Amphetamine UR 08/21/2022 None Detected  NONE DETECTED (Cut Off Level 1000 ng/mL) Preliminary   POC Secobarbital (BAR) 08/21/2022 None Detected  NONE DETECTED (Cut Off Level 300 ng/mL) Preliminary   POC Buprenorphine (BUP) 08/21/2022 None Detected  NONE DETECTED (Cut Off Level 10 ng/mL) Preliminary   POC Oxazepam (BZO) 08/21/2022 None Detected  NONE DETECTED (Cut Off Level 300 ng/mL) Preliminary   POC Cocaine UR 08/21/2022 None Detected  NONE DETECTED (Cut Off Level 300 ng/mL) Preliminary   POC Methamphetamine UR 08/21/2022 None Detected  NONE DETECTED (Cut Off Level 1000 ng/mL) Preliminary   POC Morphine 08/21/2022 None Detected  NONE DETECTED (Cut Off Level 300 ng/mL) Preliminary   POC Methadone UR 08/21/2022 None Detected  NONE DETECTED (Cut Off Level 300 ng/mL) Preliminary   POC Oxycodone UR 08/21/2022 None Detected  NONE DETECTED (Cut Off Level 100 ng/mL) Preliminary   POC Marijuana UR 08/21/2022 None Detected  NONE DETECTED (Cut Off Level 50 ng/mL) Preliminary   Preg Test, Ur 08/21/2022 Negative  Negative Preliminary   Preg Test, Ur 08/21/2022 NEGATIVE  NEGATIVE Final   Comment:        THE SENSITIVITY OF THIS METHODOLOGY IS >24 mIU/mL   Hospital Outpatient Visit on 08/18/2022  Component Date Value Ref Range Status  SARSCOV2ONAVIRUS 2 AG 08/21/2022 NEGATIVE  NEGATIVE Final   Comment: (NOTE) SARS-CoV-2 antigen NOT DETECTED.   Negative results are presumptive.  Negative results do not preclude SARS-CoV-2 infection and should not be used as the sole basis for treatment or other patient management decisions, including infection  control decisions, particularly in the presence of clinical signs and  symptoms consistent with COVID-19, or in those who have been in contact with the virus.  Negative results must be combined with clinical observations, patient history, and epidemiological information. The expected result is  Negative.  Fact Sheet for Patients: HandmadeRecipes.com.cy  Fact Sheet for Healthcare Providers: FuneralLife.at  This test is not yet approved or cleared by the [place] FDA and  has been authorized for detection and/or diagnosis of SARS-CoV-2 by FDA under an Emergency Use Authorization (EUA).  This EUA will remain in effect (meaning this test can be used) for the duration of  the COV                          ID-19 declaration under Section 564(b)(1) of the Act, 21 U.S.C. section 360bbb-3(b)(1), unless the authorization is terminated or revoked sooner.    Office Visit on 08/18/2022  Component Date Value Ref Range Status   Preg Test, Ur 08/18/2022 NEGATIVE  NEGATIVE Final   Comment:        THE SENSITIVITY OF THIS METHODOLOGY IS >24 mIU/mL    High risk HPV 08/18/2022 Negative   Final   Adequacy 08/18/2022 Satisfactory for evaluation; transformation zone component PRESENT.   Final   Diagnosis 08/18/2022 - Negative for intraepithelial lesion or malignancy (NILM)   Final   Comment 08/18/2022 Normal Reference Range HPV - Negative   Final  Admission on 07/30/2022, Discharged on 08/05/2022  Component Date Value Ref Range Status   WBC 07/30/2022 8.8  4.0 - 10.5 K/uL Final   RBC 07/30/2022 4.66  3.87 - 5.11 MIL/uL Final   Hemoglobin 07/30/2022 14.0  12.0 - 15.0 g/dL Final   HCT 07/30/2022 43.6  36.0 - 46.0 % Final   MCV 07/30/2022 93.6  80.0 - 100.0 fL Final   MCH 07/30/2022 30.0  26.0 - 34.0 pg Final   MCHC 07/30/2022 32.1  30.0 - 36.0 g/dL Final   RDW 07/30/2022 13.2  11.5 - 15.5 % Final   Platelets 07/30/2022 289  150 - 400 K/uL Final   nRBC 07/30/2022 0.0  0.0 - 0.2 % Final   Performed at [facility], [place] [address] [place], [place] [address]   TSH 07/30/2022 2.904  0.350 - 4.500 uIU/mL Final   Comment: Performed by a 3rd Generation assay with a functional sensitivity of <=0.01 uIU/mL. Performed at [facility], [place] [address], [place], [place] [address]    SARS Coronavirus 2 by RT PCR 08/01/2022 NEGATIVE  NEGATIVE Final   Comment: (NOTE) SARS-CoV-2 target nucleic acids are NOT DETECTED.  The SARS-CoV-2 RNA is generally detectable in upper and lower respiratory specimens during the acute phase of infection. The lowest concentration of SARS-CoV-2 viral copies this assay can detect is 250 copies / mL. A negative result does not preclude SARS-CoV-2 infection and should not be used as the sole basis for treatment or other patient management decisions.  A negative result may occur with improper specimen collection / handling, submission of specimen other than nasopharyngeal swab, presence of viral mutation(s) within the areas targeted by this assay, and inadequate number of viral  copies (<250 copies / mL). A negative result must be combined with clinical observations, patient history, and epidemiological information.  Fact Sheet for Patients:   https://www.patel.info/  Fact Sheet for Healthcare Providers: https://hall.com/  This test is not yet approved or                           cleared by the [place] FDA and has been authorized for detection and/or diagnosis of SARS-CoV-2 by FDA under an Emergency Use Authorization (EUA).  This EUA will remain in effect (meaning this test can be used) for the duration of the COVID-19 declaration under Section 564(b)(1) of the Act, 21 U.S.C. section 360bbb-3(b)(1), unless the authorization is terminated or revoked sooner.  Performed at [facility], [place] [address], [place], [place] [address]    Sodium 08/03/2022 141  135 - 145 mmol/L Final   Potassium 08/03/2022 4.1  3.5 - 5.1 mmol/L Final   Chloride 08/03/2022 108  98 - 111 mmol/L Final   CO2 08/03/2022 27  22 - 32 mmol/L Final   Glucose, Bld 08/03/2022 115 (H)  70 - 99 mg/dL Final   Glucose reference range  applies only to samples taken after fasting for at least 8 hours.   BUN 08/03/2022 12  6 - 20 mg/dL Final   Creatinine, Ser 08/03/2022 0.91  0.44 - 1.00 mg/dL Final   Calcium 08/03/2022 9.8  8.9 - 10.3 mg/dL Final   Total Protein 08/03/2022 7.2  6.5 - 8.1 g/dL Final   Albumin 08/03/2022 4.5  3.5 - 5.0 g/dL Final   AST 08/03/2022 24  15 - 41 U/L Final   ALT 08/03/2022 33  0 - 44 U/L Final   Alkaline Phosphatase 08/03/2022 63  38 - 126 U/L Final   Total Bilirubin 08/03/2022 0.4  0.3 - 1.2 mg/dL Final   GFR, Estimated 08/03/2022 >60  >60 mL/min Final   Comment: (NOTE) Calculated using the CKD-EPI Creatinine Equation (2021)    Anion gap 08/03/2022 6  5 - 15 Final   Performed at [facility], [place] [address] [place], [place] [address]  Admission on 07/28/2022, Discharged on 07/30/2022  Component Date Value Ref Range Status   Sodium 07/28/2022 141  135 - 145 mmol/L Final   Potassium 07/28/2022 3.1 (L)  3.5 - 5.1 mmol/L Final   Chloride 07/28/2022 114 (H)  98 - 111 mmol/L Final   CO2 07/28/2022 21 (L)  22 - 32 mmol/L Final   Glucose, Bld 07/28/2022 84  70 - 99 mg/dL Final   Glucose reference range applies only to samples taken after fasting for at least 8 hours.   BUN 07/28/2022 13  6 - 20 mg/dL Final   Creatinine, Ser 07/28/2022 0.67  0.44 - 1.00 mg/dL Final   Calcium 07/28/2022 7.8 (L)  8.9 - 10.3 mg/dL Final   Total Protein 07/28/2022 5.7 (L)  6.5 - 8.1 g/dL Final   Albumin 07/28/2022 3.4 (L)  3.5 - 5.0 g/dL Final   AST 07/28/2022 22  15 - 41 U/L Final   ALT 07/28/2022 25  0 - 44 U/L Final   Alkaline Phosphatase 07/28/2022 52  38 - 126 U/L Final   Total Bilirubin 07/28/2022 0.4  0.3 - 1.2 mg/dL Final   GFR, Estimated 07/28/2022 >60  >60 mL/min Final   Comment: (NOTE) Calculated using the CKD-EPI Creatinine Equation (2021)    Anion gap 07/28/2022 6  5 - 15 Final  Performed at [facility], [place] [address] [place], [place] [address]   Alcohol,  Ethyl (B) 07/28/2022 <10  <10 mg/dL Final   Comment: (NOTE) Lowest detectable limit for serum alcohol is 10 mg/dL.  For medical purposes only. Performed at [facility], [place] [address] [place], [place] [address]    Opiates 07/29/2022 NONE DETECTED  NONE DETECTED Final   Cocaine 07/29/2022 NONE DETECTED  NONE DETECTED Final   Benzodiazepines 07/29/2022 NONE DETECTED  NONE DETECTED Final   Amphetamines 07/29/2022 NONE DETECTED  NONE DETECTED Final   Tetrahydrocannabinol 07/29/2022 NONE DETECTED  NONE DETECTED Final   Barbiturates 07/29/2022 NONE DETECTED  NONE DETECTED Final   Comment: (NOTE) DRUG SCREEN FOR MEDICAL PURPOSES ONLY.  IF CONFIRMATION IS NEEDED FOR ANY PURPOSE, NOTIFY LAB WITHIN 5 DAYS.  LOWEST DETECTABLE LIMITS FOR URINE DRUG SCREEN Drug Class                     Cutoff (ng/mL) Amphetamine and metabolites    1000 Barbiturate and metabolites    200 Benzodiazepine                 200 Opiates and metabolites        300 Cocaine and metabolites        300 THC                            50 Performed at [facility], [place] [address] [place], [place] [address]    WBC 07/28/2022 8.8  4.0 - 10.5 K/uL Final   RBC 07/28/2022 4.15  3.87 - 5.11 MIL/uL Final   Hemoglobin 07/28/2022 12.5  12.0 - 15.0 g/dL Final   HCT 07/28/2022 38.4  36.0 - 46.0 % Final   MCV 07/28/2022 92.5  80.0 - 100.0 fL Final   MCH 07/28/2022 30.1  26.0 - 34.0 pg Final   MCHC 07/28/2022 32.6  30.0 - 36.0 g/dL Final   RDW 07/28/2022 12.9  11.5 - 15.5 % Final   Platelets 07/28/2022 260  150 - 400 K/uL Final   nRBC 07/28/2022 0.0  0.0 - 0.2 % Final   Neutrophils Relative % 07/28/2022 57  % Final   Neutro Abs 07/28/2022 5.1  1.7 - 7.7 K/uL Final   Lymphocytes Relative 07/28/2022 32  % Final   Lymphs Abs 07/28/2022 2.8  0.7 - 4.0 K/uL Final   Monocytes Relative 07/28/2022 8  % Final   Monocytes Absolute 07/28/2022 0.7  0.1 - 1.0 K/uL Final   Eosinophils Relative  07/28/2022 1  % Final   Eosinophils Absolute 07/28/2022 0.1  0.0 - 0.5 K/uL Final   Basophils Relative 07/28/2022 1  % Final   Basophils Absolute 07/28/2022 0.0  0.0 - 0.1 K/uL Final   Immature Granulocytes 07/28/2022 1  % Final   Abs Immature Granulocytes 07/28/2022 0.04  0.00 - 0.07 K/uL Final   Performed at [facility], [place] [address], [place], [place] [address]   I-stat hCG, quantitative 07/28/2022 <5.0  <5 mIU/mL Final   Comment 3 07/28/2022          Final   Comment:   GEST. AGE      CONC.  (mIU/mL)   <=1 WEEK        5 - 50     2 WEEKS       50 - 500     3 WEEKS  100 - 10,000     4 WEEKS     1,000 - 30,000        FEMALE AND NON-PREGNANT FEMALE:     LESS THAN 5 mIU/mL    Salicylate Lvl [id number] <7.0 (L)  7.0 - 30.0 mg/dL Final   Performed at [facility], [place] [address] [place], [place] [address]   Acetaminophen (Tylenol), Serum 07/28/2022 <10 (L)  10 - 30 ug/mL Final   Comment: (NOTE) Therapeutic concentrations vary significantly. A range of 10-30 ug/mL  may be an effective concentration for many patients. However, some  are best treated at concentrations outside of this range. Acetaminophen concentrations >150 ug/mL at 4 hours after ingestion  and >50 ug/mL at 12 hours after ingestion are often associated with  toxic reactions.  Performed at [facility], [place], [place] [address], [place], [place] [address]    Color, Urine 07/29/2022 YELLOW  YELLOW Final   APPearance 07/29/2022 HAZY (A)  CLEAR Final   Specific Gravity, Urine 07/29/2022 1.010  1.005 - 1.030 Final   pH 07/29/2022 6.0  5.0 - 8.0 Final   Glucose, UA 07/29/2022 NEGATIVE  NEGATIVE mg/dL Final   Hgb urine dipstick 07/29/2022 NEGATIVE  NEGATIVE Final   Bilirubin Urine 07/29/2022 NEGATIVE  NEGATIVE Final   Ketones, ur 07/29/2022 NEGATIVE  NEGATIVE mg/dL Final   Protein, ur 07/29/2022 NEGATIVE  NEGATIVE mg/dL Final   Nitrite 07/29/2022 NEGATIVE  NEGATIVE Final    Leukocytes,Ua 07/29/2022 MODERATE (A)  NEGATIVE Final   RBC / HPF 07/29/2022 0-5  0 - 5 RBC/hpf Final   WBC, UA 07/29/2022 11-20  0 - 5 WBC/hpf Final   Bacteria, UA 07/29/2022 FEW (A)  NONE SEEN Final   Squamous Epithelial / HPF 07/29/2022 21-50  0 - 5 Final   Mucus 07/29/2022 PRESENT   Final   Performed at [facility], [place] [address], [place], [place] [address]   Magnesium 07/28/2022 2.2  1.7 - 2.4 mg/dL Final   Performed at [place] [address], [place], [place] [address]   Sodium 07/29/2022 140  135 - 145 mmol/L Final   Potassium 07/29/2022 4.1  3.5 - 5.1 mmol/L Final   Chloride 07/29/2022 110  98 - 111 mmol/L Final   CO2 07/29/2022 23  22 - 32 mmol/L Final   Glucose, Bld 07/29/2022 100 (H)  70 - 99 mg/dL Final   Glucose reference range applies only to samples taken after fasting for at least 8 hours.   BUN 07/29/2022 13  6 - 20 mg/dL Final   Creatinine, Ser 07/29/2022 0.76  0.44 - 1.00 mg/dL Final   Calcium 07/29/2022 8.5 (L)  8.9 - 10.3 mg/dL Final   GFR, Estimated 07/29/2022 >60  >60 mL/min Final   Comment: (NOTE) Calculated using the CKD-EPI Creatinine Equation (2021)    Anion gap 07/29/2022 7  5 - 15 Final   Performed at [facility], [place] [address] [place], [place] [address]   Acetaminophen (Tylenol), Serum 07/29/2022 <10 (L)  10 - 30 ug/mL Final   Comment: (NOTE) Therapeutic concentrations vary significantly. A range of 10-30 ug/mL  may be an effective concentration for many patients. However, some  are best treated at concentrations outside of this range. Acetaminophen concentrations >150 ug/mL at 4 hours after ingestion  and >50 ug/mL at 12 hours after ingestion are often associated with  toxic reactions.  Performed at [facility], [place] [address] [place], [place]  [address]    SARS Coronavirus 2 by RT PCR 07/29/2022 NEGATIVE  NEGATIVE Final   Comment: (NOTE) SARS-CoV-2 target  nucleic acids are NOT DETECTED.  The SARS-CoV-2 RNA is generally detectable in upper respiratory specimens during the acute phase of infection. The lowest concentration of SARS-CoV-2 viral copies this assay can detect is 138 copies/mL. A negative result does not preclude SARS-Cov-2 infection and should not be used as the sole basis for treatment or other patient management decisions. A negative result may occur with  improper specimen collection/handling, submission of specimen other than nasopharyngeal swab, presence of viral mutation(s) within the areas targeted by this assay, and inadequate number of viral copies(<138 copies/mL). A negative result must be combined with clinical observations, patient history, and epidemiological information. The expected result is Negative.  Fact Sheet for Patients:  EntrepreneurPulse.com.au  Fact Sheet for Healthcare Providers:  IncredibleEmployment.be  This test is no                          t yet approved or cleared by the [place] FDA and  has been authorized for detection and/or diagnosis of SARS-CoV-2 by FDA under an Emergency Use Authorization (EUA). This EUA will remain  in effect (meaning this test can be used) for the duration of the COVID-19 declaration under Section 564(b)(1) of the Act, 21 U.S.C.section 360bbb-3(b)(1), unless the authorization is terminated  or revoked sooner.       Influenza A by PCR 07/29/2022 NEGATIVE  NEGATIVE Final   Influenza B by PCR 07/29/2022 NEGATIVE  NEGATIVE Final   Comment: (NOTE) The Xpert Xpress SARS-CoV-2/FLU/RSV plus assay is intended as an aid in the diagnosis of influenza from Nasopharyngeal swab specimens and should not be used as a sole basis for treatment. Nasal washings and aspirates are unacceptable for Xpert Xpress SARS-CoV-2/FLU/RSV testing.  Fact Sheet for Patients: EntrepreneurPulse.com.au  Fact Sheet for Healthcare  Providers: IncredibleEmployment.be  This test is not yet approved or cleared by the [place] FDA and has been authorized for detection and/or diagnosis of SARS-CoV-2 by FDA under an Emergency Use Authorization (EUA). This EUA will remain in effect (meaning this test can be used) for the duration of the COVID-19 declaration under Section 564(b)(1) of the Act, 21 U.S.C. section 360bbb-3(b)(1), unless the authorization is terminated or revoked.     Resp Syncytial Virus by PCR 07/29/2022 NEGATIVE  NEGATIVE Final   Comment: (NOTE) Fact Sheet for Patients: EntrepreneurPulse.com.au  Fact Sheet for Healthcare Providers: IncredibleEmployment.be  This test is not yet approved or cleared by the [place] FDA and has been authorized for detection and/or diagnosis of SARS-CoV-2 by FDA under an Emergency Use Authorization (EUA). This EUA will remain in effect (meaning this test can be used) for the duration of the COVID-19 declaration under Section 564(b)(1) of the Act, 21 U.S.C. section 360bbb-3(b)(1), unless the authorization is terminated or revoked.  Performed at [facility], [place] [address] [place], [place] [address]   Admission on 07/21/2022, Discharged on 07/21/2022  Component Date Value Ref Range Status   SARS Coronavirus 2 07/21/2022 POSITIVE (A)  NEGATIVE Final   Comment: (NOTE) SARS-CoV-2 target nucleic acids are DETECTED.  The SARS-CoV-2 RNA is generally detectable in upper and lower respiratory specimens during the acute phase of infection. Positive results are indicative of the presence of SARS-CoV-2 RNA. Clinical correlation with patient history and other diagnostic information is  necessary to determine patient infection status. Positive results do not  rule out bacterial infection or co-infection with other viruses.  The expected result is Negative.  Fact Sheet for  Patients: SugarRoll.be  Fact Sheet for Healthcare Providers: https://www.woods-mathews.com/  This test is not yet approved or cleared by the [place] FDA and  has been authorized for detection and/or diagnosis of SARS-CoV-2 by FDA under an Emergency Use Authorization (EUA). This EUA will remain  in effect (meaning this test can be used) for the duration of the COVID-19 declaration under Section 564(b)(1) of the Act, 21 U.                          S.C. section 360bbb-3(b)(1), unless the authorization is terminated or revoked sooner.   Performed at [facility], [place] [address] [place], [place] [address]   Admission on 07/12/2022, Discharged on 07/17/2022  Component Date Value Ref Range Status   SARS Coronavirus 2 by RT PCR 07/12/2022 NEGATIVE  NEGATIVE Final   Comment: (NOTE) SARS-CoV-2 target nucleic acids are NOT DETECTED.  The SARS-CoV-2 RNA is generally detectable in upper and lower respiratory specimens during the acute phase of infection. The lowest concentration of SARS-CoV-2 viral copies this assay can detect is 250 copies / mL. A negative result does not preclude SARS-CoV-2 infection and should not be used as the sole basis for treatment or other patient management decisions.  A negative result may occur with improper specimen collection / handling, submission of specimen other than nasopharyngeal swab, presence of viral mutation(s) within the areas targeted by this assay, and inadequate number of viral copies (<250 copies / mL). A negative result must be combined with clinical observations, patient history, and epidemiological information.  Fact Sheet for Patients:   https://www.patel.info/  Fact Sheet for Healthcare Providers: https://hall.com/  This test is not yet approved or                           cleared by the [place] FDA and has been authorized for  detection and/or diagnosis of SARS-CoV-2 by FDA under an Emergency Use Authorization (EUA).  This EUA will remain in effect (meaning this test can be used) for the duration of the COVID-19 declaration under Section 564(b)(1) of the Act, 21 U.S.C. section 360bbb-3(b)(1), unless the authorization is terminated or revoked sooner.  Performed at [facility], [place] [address], [place], [place] [address]    WBC 07/13/2022 7.2  4.0 - 10.5 K/uL Final   RBC 07/13/2022 4.72  3.87 - 5.11 MIL/uL Final   Hemoglobin 07/13/2022 14.4  12.0 - 15.0 g/dL Final   HCT 07/13/2022 44.1  36.0 - 46.0 % Final   MCV 07/13/2022 93.4  80.0 - 100.0 fL Final   MCH 07/13/2022 30.5  26.0 - 34.0 pg Final   MCHC 07/13/2022 32.7  30.0 - 36.0 g/dL Final   RDW 07/13/2022 13.2  11.5 - 15.5 % Final   Platelets 07/13/2022 189  150 - 400 K/uL Final   nRBC 07/13/2022 0.0  0.0 - 0.2 % Final   Performed at [facility], [place] [address] [place], [place] [address]   Hgb A1c MFr Bld 07/13/2022 5.4  4.8 - 5.6 % Final   Comment: (NOTE)         Prediabetes: 5.7 - 6.4         Diabetes: >6.4         Glycemic control for adults with diabetes: <7.0  Mean Plasma Glucose 07/13/2022 108  mg/dL Final   Comment: (NOTE) Performed At: [facility] [place], [place] [id number] Rush Farmer MD [id number]    Cholesterol 07/13/2022 206 (H)  0 - 200 mg/dL Final   Triglycerides 07/13/2022 60  <150 mg/dL Final   HDL 07/13/2022 65  >40 mg/dL Final   Total CHOL/HDL Ratio 07/13/2022 3.2  RATIO Final   VLDL 07/13/2022 12  0 - 40 mg/dL Final   LDL Cholesterol 07/13/2022 129 (H)  0 - 99 mg/dL Final   Comment:        Total Cholesterol/HDL:CHD Risk Coronary Heart Disease Risk Table                     Men   Women  1/2 Average Risk   3.4   3.3  Average Risk       5.0   4.4  2 X Average Risk   9.6   7.1  3 X Average Risk  23.4   11.0        Use the calculated Patient Ratio above  and the CHD Risk Table to determine the patient's CHD Risk.        ATP III CLASSIFICATION (LDL):  <100     mg/dL   Optimal  100-129  mg/dL   Near or Above                    Optimal  130-159  mg/dL   Borderline  160-189  mg/dL   High  >190     mg/dL   Very High Performed at [place] [address] [place], [place] [address]    TSH 07/13/2022 2.213  0.350 - 4.500 uIU/mL Final   Comment: Performed by a 3rd Generation assay with a functional sensitivity of <=0.01 uIU/mL. Performed at [facility], [place] [address] [place], [place] [address]    Color, Urine 07/13/2022 YELLOW  YELLOW Final   APPearance 07/13/2022 TURBID (A)  CLEAR Final   Specific Gravity, Urine 07/13/2022 1.025  1.005 - 1.030 Final   pH 07/13/2022 5.0  5.0 - 8.0 Final   Glucose, UA 07/13/2022 NEGATIVE  NEGATIVE mg/dL Final   Hgb urine dipstick 07/13/2022 NEGATIVE  NEGATIVE Final   Bilirubin Urine 07/13/2022 NEGATIVE  NEGATIVE Final   Ketones, ur 07/13/2022 5 (A)  NEGATIVE mg/dL Final   Protein, ur 07/13/2022 30 (A)  NEGATIVE mg/dL Final   Nitrite 07/13/2022 NEGATIVE  NEGATIVE Final   Leukocytes,Ua 07/13/2022 MODERATE (A)  NEGATIVE Final   RBC / HPF 07/13/2022 11-20  0 - 5 RBC/hpf Final   WBC, UA 07/13/2022 >50 (H)  0 - 5 WBC/hpf Final   Bacteria, UA 07/13/2022 MANY (A)  NONE SEEN Final   Squamous Epithelial / HPF 07/13/2022 >50 (H)  0 - 5 Final   Mucus 07/13/2022 PRESENT   Final   Performed at [facility], [place] [address] [place], [place] [address]   Sodium 07/14/2022 140  135 - 145 mmol/L Final   Potassium 07/14/2022 3.8  3.5 - 5.1 mmol/L Final   Chloride 07/14/2022 105  98 - 111 mmol/L Final   CO2 07/14/2022 27  22 - 32 mmol/L Final   Glucose, Bld 07/14/2022 91  70 - 99 mg/dL Final   Glucose reference range applies only to samples taken after fasting for at least 8 hours.   BUN 07/14/2022 11  6 - 20 mg/dL Final  Creatinine, Ser 07/14/2022 0.80  0.44  - 1.00 mg/dL Final   Calcium 07/14/2022 9.4  8.9 - 10.3 mg/dL Final   Total Protein 07/14/2022 7.1  6.5 - 8.1 g/dL Final   Albumin 07/14/2022 4.1  3.5 - 5.0 g/dL Final   AST 07/14/2022 25  15 - 41 U/L Final   ALT 07/14/2022 34  0 - 44 U/L Final   Alkaline Phosphatase 07/14/2022 66  38 - 126 U/L Final   Total Bilirubin 07/14/2022 0.6  0.3 - 1.2 mg/dL Final   GFR, Estimated 07/14/2022 >60  >60 mL/min Final   Comment: (NOTE) Calculated using the CKD-EPI Creatinine Equation (2021)    Anion gap 07/14/2022 8  5 - 15 Final   Performed at [facility], [place] [address], [place], [place] [address]   Preg Test, Ur 07/13/2022 NEGATIVE  NEGATIVE Final   Comment:        THE SENSITIVITY OF THIS METHODOLOGY IS >20 mIU/mL. Performed at [facility], [place] [address] [place], [place] [address]    hCG, Ollen Barges, Quant, Chauncey Cruel 07/14/2022 <1  <5 mIU/mL Final   Comment:          GEST. AGE      CONC.  (mIU/mL)   <=1 WEEK        5 - 50     2 WEEKS       50 - 500     3 WEEKS       100 - 10,000     4 WEEKS     1,000 - 30,000     5 WEEKS     3,500 - 115,000   6-8 WEEKS     12,000 - 270,000    12 WEEKS     15,000 - 220,000        FEMALE AND NON-PREGNANT FEMALE:     LESS THAN 5 mIU/mL Performed at [facility], [place] [address] [place], [place] [address]    Color, Urine 07/14/2022 YELLOW  YELLOW Final   APPearance 07/14/2022 CLOUDY (A)  CLEAR Final   Specific Gravity, Urine 07/14/2022 1.021  1.005 - 1.030 Final   pH 07/14/2022 5.0  5.0 - 8.0 Final   Glucose, UA 07/14/2022 NEGATIVE  NEGATIVE mg/dL Final   Hgb urine dipstick 07/14/2022 NEGATIVE  NEGATIVE Final   Bilirubin Urine 07/14/2022 NEGATIVE  NEGATIVE Final   Ketones, ur 07/14/2022 NEGATIVE  NEGATIVE mg/dL Final   Protein, ur 07/14/2022 NEGATIVE  NEGATIVE mg/dL Final   Nitrite 07/14/2022 NEGATIVE  NEGATIVE Final   Leukocytes,Ua 07/14/2022 LARGE (A)  NEGATIVE Final   RBC / HPF 07/14/2022 6-10  0 -  5 RBC/hpf Final   WBC, UA 07/14/2022 >50 (H)  0 - 5 WBC/hpf Final   Bacteria, UA 07/14/2022 MANY (A)  NONE SEEN Final   Squamous Epithelial / HPF 07/14/2022 >50 (H)  0 - 5 Final   Performed at [facility], [place] [address], [place], [place] [address]  Postpartum Visit on 07/09/2022  Component Date Value Ref Range Status   Neisseria Gonorrhea 07/09/2022 Negative   Final   Chlamydia 07/09/2022 Negative   Final   Trichomonas 07/09/2022 Negative   Final   Bacterial Vaginitis (gardnerella) 07/09/2022 Positive (A)   Final   Candida Vaginitis 07/09/2022 Negative   Final   Candida Glabrata 07/09/2022 Negative   Final   Comment 07/09/2022 Normal Reference Range Bacterial Vaginosis - Negative   Final   Comment 07/09/2022 Normal  Reference Range Candida Species - Negative   Final   Comment 07/09/2022 Normal Reference Range Candida Galbrata - Negative   Final   Comment 07/09/2022 Normal Reference Range Trichomonas - Negative   Final   Comment 07/09/2022 Normal Reference Ranger Chlamydia - Negative   Final   Comment 07/09/2022 Normal Reference Range Neisseria Gonorrhea - Negative   Final   Preg Test, Ur 07/09/2022 NEGATIVE  NEGATIVE Final   Comment:        THE SENSITIVITY OF THIS METHODOLOGY IS >24 mIU/mL   There may be more visits with results that are not included.    Allergies: Ascorbate, Citrus, Coconut flavor, Lamotrigine, Orange (diagnostic), Peach flavor, Pear, and Pineapple  Medications:  Facility Ordered Medications  Medication   acetaminophen (TYLENOL) tablet 650 mg   alum & mag hydroxide-simeth (MAALOX/MYLANTA) 200-200-20 MG/5ML suspension 30 mL   magnesium hydroxide (MILK OF MAGNESIA) suspension 30 mL   OLANZapine zydis (ZYPREXA) disintegrating tablet 5 mg   And   LORazepam (ATIVAN) tablet 1 mg   And   ziprasidone (GEODON) injection 20 mg   amLODipine (NORVASC) tablet 5 mg   ARIPiprazole (ABILIFY) tablet 10 mg   FLUoxetine (PROZAC) capsule 20 mg    hydrOXYzine (ATARAX) tablet 25 mg   melatonin tablet 10 mg   nicotine (NICODERM CQ - dosed in mg/24 hours) patch 14 mg   PTA Medications  Medication Sig   amLODipine (NORVASC) 5 MG tablet Take 1 tablet (5 mg total) by mouth daily. For high blood pressure   FLUoxetine (PROZAC) 20 MG capsule Take 1 capsule (20 mg total) by mouth daily. For depression   hydrOXYzine (ATARAX) 25 MG tablet Take 1 tablet (25 mg total) by mouth 3 (three) times daily as needed for anxiety.   nicotine (NICODERM CQ - DOSED IN MG/24 HOURS) 14 mg/24hr patch Place 1 patch (14 mg total) onto the skin daily. (May buy from over the counter): For smoking cessation.   Melatonin 10 MG TABS Take 10 mg by mouth at bedtime. For sleep   ARIPiprazole (ABILIFY) 10 MG tablet Take 1 tablet (10 mg total) by mouth at bedtime. For mood stability    Medical Decision Making  Inpatient observation    Lab Orders         Resp panel by RT-PCR (RSV, Flu A&B, Covid) Anterior Nasal Swab         CBC with Differential/Platelet         Comprehensive metabolic panel         Hemoglobin A1c         TSH         POC urine preg, ED         POCT Urine Drug Screen - (I-Screen)         POC SARS Coronavirus 2 Ag         Pregnancy, urine POC      Meds ordered this encounter  Medications   acetaminophen (TYLENOL) tablet 650 mg   alum & mag hydroxide-simeth (MAALOX/MYLANTA) 200-200-20 MG/5ML suspension 30 mL   magnesium hydroxide (MILK OF MAGNESIA) suspension 30 mL   AND Linked Order Group    OLANZapine zydis (ZYPREXA) disintegrating tablet 5 mg    LORazepam (ATIVAN) tablet 1 mg    ziprasidone (GEODON) injection 20 mg   amLODipine (NORVASC) tablet 5 mg   ARIPiprazole (ABILIFY) tablet 10 mg   FLUoxetine (PROZAC) capsule 20 mg   hydrOXYzine (ATARAX) tablet 25 mg   melatonin tablet 10 mg  nicotine (NICODERM CQ - dosed in mg/24 hours) patch 14 mg     Recommendations  Based on my evaluation the patient appears to have an emergency medical  condition for which I recommend the patient be transferred to the emergency department for further evaluation.  Evette Georges, NP 10/05/22  5:29 AM

## 2022-10-04 NOTE — ED Notes (Signed)
Pt is with staff receiving an EKG ordered by provider.

## 2022-10-04 NOTE — ED Triage Notes (Signed)
Pt presents to [facility] voluntarily, accompanied by [employer] with complaint of anxiety and panic attacks. Pt reports her ACTT team calling the police tonight to assist her. Pt reports having racing thoughts, suicidal thoughts (no plan), and feeling paranoid like someone is out to get her. Pt states diagnosis of anxiety and depression and takes medication, but was unable to remember what she is prescribed. Pt has significant history of suicide attempts and prior psychiatric hospitalizations. Pt denies HI, AVH and substance/alcohol use.

## 2022-10-04 NOTE — ED Notes (Signed)
Pt is in assessment room.

## 2022-10-05 NOTE — ED Notes (Signed)
Patient alert and oriented x 3. Denies SI/HI/AVH. Denies intent or plan to harm self or others. Routine conducted according to faculty protocol. Encourage patient to notify staff with any needs or concerns. Patient verbalized agreement and understanding. Will continue to monitor for safety. 

## 2022-10-05 NOTE — ED Provider Notes (Signed)
FBC/OBS ASAP Discharge Summary  Date and Time: 10/05/2022 9:17 AM  Name: Sharon Ward  MRN:  [id number]   Discharge Diagnoses:  Final diagnoses:  Panic attack  Recurrent major depressive disorder, remission status unspecified ([place])  Ineffective coping   Subjective:   On reassessment, pt denies SI/VI/HI, AVH, paranoia. She verbally contracts to safety. She denies use of alcohol, nicotine, marijuana, crack/cocaine,  methamphetamines, other substances. UDS on 10/04/22 is negative. Discussed w/ pt referral to php/iop programming, which she agrees with. She confirms contact information for referral, [phone], [name]. She states her plan today is to go to [place] to be with family. Her grandparents, father, and brother live in [place]. She gives verbal consent to contact her ACT team which is with [facility]. She is hoping to meet with ACT before going to [place] today. She states ACT team gives her medications in weekly amounts. Called [phone] to reach ACT team, no one picked up. Called [phone], ACT crisis line, and spoke with Adonis Brook. Discussed plan for discharge today w/ php/iop referral. Discussed pt is hoping to meet with ACT today prior to leaving to [place]. [name] requests pt call ACT upon discharge to coordinate this. Adonis Brook confirms ACT is providing weekly supply of medication. Spoke with pt and she agrees to contact ACT at discharge. Pt agrees to follow up with ACT team for continued services. Pt agrees to call 911/EMS, go to the nearest emergency room or crisis center, call ACT crisis hotline for any safety concerns including SI/HI/VI, AVH, paranoia.  Stay Summary:  Pt is a 27 y/o female with reported history of depression, anxiety, bipolar II, postpartum depression presenting to [facility] on 10/04/22 voluntarily with [employer]. On reassessment, pt denies SI/VI/HI, AVH, paranoia. She is being discharged to follow up with ACT team.   Total Time spent with patient: 45 minutes  Past  Psychiatric History: Reports history of depression, anxiety, bipolar II, postpartum depression Past Medical History: Reports history of asthma Family History: Reports history of cardiac issues, diabetes Family Psychiatric History: Reports history of schizophrenia, depression, anxiety, bipolar II, anger Social History: Living alone, unemployed, highest level of education 10th grade, with [employer] [facility] [phone]; [phone]) Tobacco Cessation:  N/A, patient does not currently use tobacco products  Current Medications:  Current Facility-Administered Medications  Medication Dose Route Frequency Provider Last Rate Last Admin   acetaminophen (TYLENOL) tablet 650 mg  650 mg Oral Q6H PRN Evette Georges, NP       alum & mag hydroxide-simeth (MAALOX/MYLANTA) 200-200-20 MG/5ML suspension 30 mL  30 mL Oral Q4H PRN Evette Georges, NP       amLODipine (NORVASC) tablet 5 mg  5 mg Oral Daily Evette Georges, NP   5 mg at 10/04/22 2254   ARIPiprazole (ABILIFY) tablet 10 mg  10 mg Oral Dorie Rank, NP   10 mg at 10/04/22 2258   FLUoxetine (PROZAC) capsule 20 mg  20 mg Oral Daily Evette Georges, NP   20 mg at 10/04/22 2254   hydrOXYzine (ATARAX) tablet 25 mg  25 mg Oral TID PRN Evette Georges, NP   25 mg at 10/04/22 2254   OLANZapine zydis (ZYPREXA) disintegrating tablet 5 mg  5 mg Oral Q8H PRN Evette Georges, NP       And   LORazepam (ATIVAN) tablet 1 mg  1 mg Oral PRN Evette Georges, NP       And   ziprasidone (GEODON) injection 20 mg  20 mg Intramuscular PRN Evette Georges, NP  magnesium hydroxide (MILK OF MAGNESIA) suspension 30 mL  30 mL Oral Daily PRN Evette Georges, NP       melatonin tablet 10 mg  10 mg Oral QHS Evette Georges, NP   10 mg at 10/04/22 2254   nicotine (NICODERM CQ - dosed in mg/24 hours) patch 14 mg  14 mg Transdermal Daily Evette Georges, NP       Current Outpatient Medications  Medication Sig Dispense Refill   amLODipine (NORVASC) 5 MG tablet Take 1 tablet (5 mg total) by mouth daily.  For high blood pressure 15 tablet 0   ARIPiprazole (ABILIFY) 10 MG tablet Take 1 tablet (10 mg total) by mouth at bedtime. For mood stability 30 tablet 0   FLUoxetine (PROZAC) 20 MG capsule Take 1 capsule (20 mg total) by mouth daily. For depression 30 capsule 0   hydrOXYzine (ATARAX) 25 MG tablet Take 1 tablet (25 mg total) by mouth 3 (three) times daily as needed for anxiety. 75 tablet 0   Melatonin 10 MG TABS Take 10 mg by mouth at bedtime. For sleep 30 tablet 0   nicotine (NICODERM CQ - DOSED IN MG/24 HOURS) 14 mg/24hr patch Place 1 patch (14 mg total) onto the skin daily. (May buy from over the counter): For smoking cessation. 28 patch 0    PTA Medications:  PTA Medications  Medication Sig   amLODipine (NORVASC) 5 MG tablet Take 1 tablet (5 mg total) by mouth daily. For high blood pressure   FLUoxetine (PROZAC) 20 MG capsule Take 1 capsule (20 mg total) by mouth daily. For depression   hydrOXYzine (ATARAX) 25 MG tablet Take 1 tablet (25 mg total) by mouth 3 (three) times daily as needed for anxiety.   nicotine (NICODERM CQ - DOSED IN MG/24 HOURS) 14 mg/24hr patch Place 1 patch (14 mg total) onto the skin daily. (May buy from over the counter): For smoking cessation.   Melatonin 10 MG TABS Take 10 mg by mouth at bedtime. For sleep   ARIPiprazole (ABILIFY) 10 MG tablet Take 1 tablet (10 mg total) by mouth at bedtime. For mood stability   Facility Ordered Medications  Medication   acetaminophen (TYLENOL) tablet 650 mg   alum & mag hydroxide-simeth (MAALOX/MYLANTA) 200-200-20 MG/5ML suspension 30 mL   magnesium hydroxide (MILK OF MAGNESIA) suspension 30 mL   OLANZapine zydis (ZYPREXA) disintegrating tablet 5 mg   And   LORazepam (ATIVAN) tablet 1 mg   And   ziprasidone (GEODON) injection 20 mg   amLODipine (NORVASC) tablet 5 mg   ARIPiprazole (ABILIFY) tablet 10 mg   FLUoxetine (PROZAC) capsule 20 mg   hydrOXYzine (ATARAX) tablet 25 mg   melatonin tablet 10 mg   nicotine (NICODERM  CQ - dosed in mg/24 hours) patch 14 mg       09/16/2022    4:15 PM 08/21/2022   12:43 AM 08/18/2022    3:46 PM  Depression screen PHQ 2/9  Decreased Interest '3 2 2  '$ Down, Depressed, Hopeless '3 2 2  '$ PHQ - 2 Score '6 4 4  '$ Altered sleeping '3 1 3  '$ Tired, decreased energy '3 1 2  '$ Change in appetite '3 1 3  '$ Feeling bad or failure about yourself  '3 1 3  '$ Trouble concentrating 3 1 0  Moving slowly or fidgety/restless 0 0 0  Suicidal thoughts 0 1 0  PHQ-9 Score '21 10 15  '$ Difficult doing work/chores  Very difficult     Flowsheet Row ED from 10/04/2022 in [place]  [facility] Most recent reading at 10/05/2022  8:41 AM Admission (Discharged) from 08/30/2022 in [facility] Most recent reading at 08/31/2022  2:00 AM ED from 08/30/2022 in [facility] Emergency Department at [facility] Most recent reading at 08/30/2022  1:29 AM  C-SSRS RISK CATEGORY No Risk No Risk High Risk       Musculoskeletal  Strength & Muscle Tone: within normal limits Gait & Station: normal Patient leans: N/A  Psychiatric Specialty Exam  Presentation  General Appearance:  Appropriate for Environment; Casual; Fairly Groomed  Eye Contact: Good  Speech: Clear and Coherent; Normal Rate  Speech Volume: Normal  Handedness: Right   Mood and Affect  Mood: Anxious; Depressed  Affect: Flat   Thought Process  Thought Processes: Coherent; Goal Directed; Linear  Descriptions of Associations:Intact  Orientation:Full (Time, Place and Person)  Thought Content:Logical  Diagnosis of Schizophrenia or Schizoaffective disorder in past: No    Hallucinations:Hallucinations: None  Ideas of Reference:None  Suicidal Thoughts:Suicidal Thoughts: No SI Active Intent and/or Plan: Without Intent; Without Plan SI Passive Intent and/or Plan: Without Plan; Without Intent  Homicidal Thoughts:Homicidal Thoughts: No   Sensorium  Memory: Immediate  Good  Judgment: Fair  Insight: Fair   Executive Functions  Concentration: Good  Attention Span: Good  Recall: Good  Fund of Knowledge: Good  Language: Good   Psychomotor Activity  Psychomotor Activity: Psychomotor Activity: Normal   Assets  Assets: Communication Skills; Desire for Improvement; Financial Resources/Insurance; Housing; Physical Health; Social Support   Sleep  Sleep: Sleep: Poor Number of Hours of Sleep: 7   Nutritional Assessment (For OBS and FBC admissions only) Has the patient had a weight loss or gain of 10 pounds or more in the last 3 months?: No Has the patient had a decrease in food intake/or appetite?: No Does the patient have dental problems?: No Does the patient have eating habits or behaviors that may be indicators of an eating disorder including binging or inducing vomiting?: No Has the patient recently lost weight without trying?: 0 Has the patient been eating poorly because of a decreased appetite?: 0 Malnutrition Screening Tool Score: 0    Physical Exam  Physical Exam Constitutional:      General: She is not in acute distress.    Appearance: She is not ill-appearing, toxic-appearing or diaphoretic.  Eyes:     General: No scleral icterus. Cardiovascular:     Rate and Rhythm: Normal rate.  Pulmonary:     Effort: Pulmonary effort is normal. No respiratory distress.  Skin:    General: Skin is warm and dry.  Neurological:     Mental Status: She is alert and oriented to person, place, and time.  Psychiatric:        Attention and Perception: Attention and perception normal.        Mood and Affect: Mood is anxious and depressed. Affect is flat.        Speech: Speech normal.        Behavior: Behavior normal. Behavior is cooperative.        Thought Content: Thought content normal.        Cognition and Memory: Cognition and memory normal.        Judgment: Judgment normal.    Review of Systems  Constitutional:  Negative  for chills and fever.  Respiratory:  Negative for shortness of breath.   Cardiovascular:  Negative for chest pain and palpitations.  Gastrointestinal:  Negative for abdominal pain.  Neurological:  Negative for headaches.  Psychiatric/Behavioral:  Positive for depression. The patient is nervous/anxious.    Blood pressure 104/64, pulse 66, temperature 98.6 F (37 C), temperature source Oral, resp. rate 18, SpO2 98 %, not currently breastfeeding. There is no height or weight on file to calculate BMI.  Demographic Factors:  Adolescent or young adult, Caucasian, and Living alone  Loss Factors: NA  Historical Factors: Prior suicide attempts and Family history of mental illness or substance abuse  Risk Reduction Factors:   Sense of responsibility to family  Continued Clinical Symptoms:  Previous Psychiatric Diagnoses and Treatments  Cognitive Features That Contribute To Risk:  None    Suicide Risk:  Mild:  Suicidal ideation of limited frequency, intensity, duration, and specificity.  There are no identifiable plans, no associated intent, mild dysphoria and related symptoms, good self-control (both objective and subjective assessment), few other risk factors, and identifiable protective factors, including available and accessible social support.  Plan Of Care/Follow-up recommendations:  Follow up with outpatient psychiatry and counseling Follow up with ACT team Referred to PHP/IOP programming  Disposition:  Discharge  Tharon Aquas, NP 10/05/2022, 9:17 AM

## 2022-10-05 NOTE — Discharge Instructions (Addendum)
Please follow up with your ACT team for continued behavioral health services.  Additionally, you are being referred to the partial hospitalization/intensive outpatient program. A staff member should be reaching out to you shortly to schedule an intake.  Patient is instructed prior to discharge to: Take all medications as prescribed by his/her mental healthcare provider. Report any adverse effects and or reactions from the medicines to his/her outpatient provider promptly. Keep all scheduled appointments, to ensure that you are getting refills on time and to avoid any interruption in your medication.  If you are unable to keep an appointment call to reschedule.  Be sure to follow-up with resources and follow-up appointments provided.  Patient has been instructed & cautioned: To not engage in alcohol and or illegal drug use while on prescription medicines. In the event of worsening symptoms, patient is instructed to call the crisis hotline, 911 and or go to the nearest ED for appropriate evaluation and treatment of symptoms. To follow-up with his/her primary care provider for your other medical issues, concerns and or health care needs.  Information: -National Suicide Prevention Lifeline 1-800-SUICIDE or (506)457-0380.  -988 offers 24/7 access to trained crisis counselors who can help people experiencing mental health-related distress. People can call or text 988 or chat 988lifeline.org for themselves or if they are worried about a loved one who may need crisis support.

## 2022-10-05 NOTE — ED Notes (Signed)
Patient A&O x 4, ambulatory. Patient discharged in no acute distress. Patient denied SI/HI, A/VH upon discharge. Patient verbalized understanding of all discharge instructions explained by staff, to include follow up appointments, RX's and safety plan. Patient reported mood 10/10.  Pt belongings returned to patient from locker #  29 intact. Patient escorted to lobby via staff for transport to destination. Safety maintained. Bus pass was given.

## 2022-10-05 NOTE — ED Notes (Addendum)
Pt is in the bed sleeping. Respirations are even and unlabored. No acute distress noted. Will continue to monitor for safety. 

## 2022-10-05 NOTE — ED Notes (Signed)
Patient  sleeping in no acute stress. RR even and unlabored .Environment secured .Will continue to monitor for safely. 

## 2022-10-06 LAB — HEMOGLOBIN A1C
Hgb A1c MFr Bld: 5 % (ref 4.8–5.6)
Mean Plasma Glucose: 97 mg/dL

## 2022-10-07 ENCOUNTER — Telehealth (HOSPITAL_COMMUNITY): Payer: Self-pay | Admitting: Licensed Clinical Social Worker

## 2022-10-16 ENCOUNTER — Other Ambulatory Visit: Payer: Self-pay

## 2022-10-16 ENCOUNTER — Emergency Department (EMERGENCY_DEPARTMENT_HOSPITAL)
Admission: EM | Admit: 2022-10-16 | Discharge: 2022-10-18 | Disposition: A | Discharge status: Other Acute Inpatient Hospital | Payer: Medicaid Other | Source: Home / Self Care | Attending: Emergency Medicine | Admitting: Emergency Medicine

## 2022-10-16 ENCOUNTER — Encounter (HOSPITAL_COMMUNITY): Payer: Self-pay

## 2022-10-16 DIAGNOSIS — F430 Acute stress reaction: Secondary | ICD-10-CM

## 2022-10-16 DIAGNOSIS — T50902A Poisoning by unspecified drugs, medicaments and biological substances, intentional self-harm, initial encounter: Secondary | ICD-10-CM

## 2022-10-16 DIAGNOSIS — F319 Bipolar disorder, unspecified: Secondary | ICD-10-CM | POA: Diagnosis present

## 2022-10-16 DIAGNOSIS — X838XXA Intentional self-harm by other specified means, initial encounter: Secondary | ICD-10-CM | POA: Insufficient documentation

## 2022-10-16 DIAGNOSIS — Z20822 Contact with and (suspected) exposure to covid-19: Secondary | ICD-10-CM | POA: Insufficient documentation

## 2022-10-16 DIAGNOSIS — F3160 Bipolar disorder, current episode mixed, unspecified: Secondary | ICD-10-CM | POA: Insufficient documentation

## 2022-10-16 DIAGNOSIS — T50901A Poisoning by unspecified drugs, medicaments and biological substances, accidental (unintentional), initial encounter: Secondary | ICD-10-CM | POA: Diagnosis present

## 2022-10-16 DIAGNOSIS — R45851 Suicidal ideations: Secondary | ICD-10-CM

## 2022-10-16 DIAGNOSIS — R4589 Other symptoms and signs involving emotional state: Secondary | ICD-10-CM

## 2022-10-16 LAB — COMPREHENSIVE METABOLIC PANEL
ALT: 21 U/L (ref 0–44)
AST: 19 U/L (ref 15–41)
Albumin: 4.6 g/dL (ref 3.5–5.0)
Alkaline Phosphatase: 64 U/L (ref 38–126)
Anion gap: 6 (ref 5–15)
BUN: 16 mg/dL (ref 6–20)
CO2: 24 mmol/L (ref 22–32)
Calcium: 9.2 mg/dL (ref 8.9–10.3)
Chloride: 106 mmol/L (ref 98–111)
Creatinine, Ser: 0.88 mg/dL (ref 0.44–1.00)
GFR, Estimated: >60 mL/min (ref >60)
Glucose, Bld: 103 mg/dL — ABNORMAL HIGH (ref 70–99)
Potassium: 3.6 mmol/L (ref 3.5–5.1)
Sodium: 136 mmol/L (ref 135–145)
Total Bilirubin: 0.5 mg/dL (ref 0.3–1.2)
Total Protein: 7.6 g/dL (ref 6.5–8.1)

## 2022-10-16 LAB — CBC
HCT: 40.5 % (ref 36.0–46.0)
Hemoglobin: 13.5 g/dL (ref 12.0–15.0)
MCH: 30.5 pg (ref 26.0–34.0)
MCHC: 33.3 g/dL (ref 30.0–36.0)
MCV: 91.4 fL (ref 80.0–100.0)
Platelets: 274 10*3/uL (ref 150–400)
RBC: 4.43 MIL/uL (ref 3.87–5.11)
RDW: 12 % (ref 11.5–15.5)
WBC: 9.2 10*3/uL (ref 4.0–10.5)
nRBC: 0 % (ref 0.0–0.2)

## 2022-10-16 LAB — RAPID URINE DRUG SCREEN, HOSP PERFORMED
Amphetamines: NOT DETECTED
Barbiturates: NOT DETECTED
Benzodiazepines: NOT DETECTED
Cocaine: NOT DETECTED
Opiates: NOT DETECTED
Tetrahydrocannabinol: NOT DETECTED

## 2022-10-16 LAB — I-STAT BETA HCG BLOOD, ED (MC, WL, AP ONLY): I-stat hCG, quantitative: <5 m[IU]/mL (ref <5)

## 2022-10-16 LAB — ACETAMINOPHEN LEVEL: Acetaminophen (Tylenol), Serum: <10 ug/mL — ABNORMAL LOW (ref 10–30)

## 2022-10-16 LAB — MAGNESIUM: Magnesium: 2.3 mg/dL (ref 1.7–2.4)

## 2022-10-16 LAB — ETHANOL: Alcohol, Ethyl (B): <10 mg/dL (ref <10)

## 2022-10-16 LAB — SALICYLATE LEVEL: Salicylate Lvl: <7 mg/dL — ABNORMAL LOW (ref 7.0–30.0)

## 2022-10-16 MED ORDER — MAGNESIUM SULFATE 2 GM/50ML IV SOLN
2.0000 g | Freq: Once | INTRAVENOUS | Status: DC
  Administered 2022-10-16: 2 g via INTRAVENOUS
  Filled 2022-10-16: qty 50

## 2022-10-16 MED ORDER — POTASSIUM CHLORIDE CRYS ER 20 MEQ PO TBCR
40.0000 meq | EXTENDED_RELEASE_TABLET | Freq: Once | ORAL | Status: AC
  Administered 2022-10-16: 40 meq via ORAL
  Filled 2022-10-16: qty 2

## 2022-10-16 NOTE — ED Provider Notes (Signed)
Care assumed form Dr. Melina Copa, patient with overdose with suicidal intent. Will need to be observed until 0600, at which time she will be medically cleared. Will need repeat ECG (QT interval) and acetaminophen level.  6:22 AM Repeat ECG shows stable QT interval which is mildly prolonged.  Patient was observed overnight and is hemodynamically stable and now medically cleared for psychiatric evaluation.   Delora Fuel, [place] [id number] [phone]

## 2022-10-16 NOTE — ED Triage Notes (Signed)
Pt BIB EMS. About an hr ago pt took her prescription pills. 20 pills in 2 of the bottles and 30 pills in 2 of the bottles were ingested. Unknown which had 20 or 30. Bottles brought in are hydroxyzine 25 mg each, trazodone 50 mg each, fluoxetine 20 mg each, and aripiprazole 10 mg each. This all started due to some family drama and the pt left her kids father a note.

## 2022-10-16 NOTE — ED Provider Notes (Signed)
[place] EMERGENCY DEPARTMENT AT [facility] Provider Note   CSN: [id number] Arrival date & time: 10/16/22  1951     History {Add pertinent medical, surgical, social history, OB history to HPI:1} Chief Complaint  Patient presents with   Suicide Attempt   Drug Overdose    Sharon Ward is a 27 y.o. female.  She has a history of depression and bipolar.  She called the ambulance tonight after she took multiple of her home medications in an attempt to hurt herself.  She had access to hydroxyzine 25 mg, trazodone 50 mg, Ariprazole 10 mg, fluoxetine 20 mg.  Possibly 30 tablets in each bottle.  This occurred about an hour prior to arrival.  She denies any complaints but is rather sleepy.  The history is provided by the patient and the EMS personnel.  Drug Overdose This is a new problem. The current episode started 1 to 2 hours ago. The problem has not changed since onset.Pertinent negatives include no chest pain, no abdominal pain, no headaches and no shortness of breath. Nothing aggravates the symptoms. Nothing relieves the symptoms. She has tried nothing for the symptoms. The treatment provided no relief.       Home Medications Prior to Admission medications   Medication Sig Start Date End Date Taking? Authorizing Provider  amLODipine (NORVASC) 5 MG tablet Take 1 tablet (5 mg total) by mouth daily. For high blood pressure 09/03/22   Nwoko, Herbert Pun I, NP  ARIPiprazole (ABILIFY) 10 MG tablet Take 1 tablet (10 mg total) by mouth at bedtime. For mood stability 09/03/22   Lindell Spar I, NP  FLUoxetine (PROZAC) 20 MG capsule Take 1 capsule (20 mg total) by mouth daily. For depression 09/04/22   Lindell Spar I, NP  hydrOXYzine (ATARAX) 25 MG tablet Take 1 tablet (25 mg total) by mouth 3 (three) times daily as needed for anxiety. 09/03/22   Lindell Spar I, NP  Melatonin 10 MG TABS Take 10 mg by mouth at bedtime. For sleep 09/03/22   Lindell Spar I, NP  nicotine (NICODERM CQ - DOSED IN  MG/24 HOURS) 14 mg/24hr patch Place 1 patch (14 mg total) onto the skin daily. (May buy from over the counter): For smoking cessation. 09/04/22   Encarnacion Slates, NP      Allergies    Ascorbate, Citrus, Coconut flavor, Lamotrigine, Orange (diagnostic), Peach flavor, Pear, and Pineapple    Review of Systems   Review of Systems  Constitutional:  Negative for fever.  Respiratory:  Negative for shortness of breath.   Cardiovascular:  Negative for chest pain.  Gastrointestinal:  Negative for abdominal pain.  Neurological:  Negative for headaches.    Physical Exam Updated Vital Signs BP 113/61 (BP Location: Left Arm)   Pulse 65   Temp 98.1 F (36.7 C) (Oral)   Resp 11   Ht '5\' 3"'$  (1.6 m)   Wt 96.8 kg   SpO2 96%   BMI 37.80 kg/m  Physical Exam Vitals and nursing note reviewed.  Constitutional:      General: She is not in acute distress.    Appearance: Normal appearance. She is well-developed.  HENT:     Head: Normocephalic and atraumatic.  Eyes:     Conjunctiva/sclera: Conjunctivae normal.  Cardiovascular:     Rate and Rhythm: Normal rate and regular rhythm.     Heart sounds: No murmur heard. Pulmonary:     Effort: Pulmonary effort is normal. No respiratory distress.     Breath sounds:  Normal breath sounds.  Abdominal:     Palpations: Abdomen is soft.     Tenderness: There is no abdominal tenderness. There is no guarding or rebound.  Musculoskeletal:        General: No swelling.     Cervical back: Neck supple.  Skin:    General: Skin is warm and dry.     Capillary Refill: Capillary refill takes less than 2 seconds.  Neurological:     General: No focal deficit present.     Mental Status: She is lethargic.     ED Results / Procedures / Treatments   Labs (all labs ordered are listed, but only abnormal results are displayed) Labs Reviewed  COMPREHENSIVE METABOLIC PANEL  ETHANOL  SALICYLATE LEVEL  ACETAMINOPHEN LEVEL  CBC  RAPID URINE DRUG SCREEN, HOSP PERFORMED   I-STAT BETA HCG BLOOD, ED ([facility], [facility], [facility] ONLY)    EKG None  Radiology No results found.  Procedures Procedures  {Document cardiac monitor, telemetry assessment procedure when appropriate:1}  Medications Ordered in ED Medications - No data to display  ED Course/ Medical Decision Making/ A&P   {   Click here for ABCD2, HEART and other calculatorsREFRESH Note before signing :1}                          Medical Decision Making Amount and/or Complexity of Data Reviewed Labs: ordered.   This patient complains of ***; this involves an extensive number of treatment Options and is a complaint that carries with it a high risk of complications and morbidity. The differential includes ***  I ordered, reviewed and interpreted labs, which included *** I ordered medication *** and reviewed PMP when indicated. I ordered imaging studies which included *** and I independently    visualized and interpreted imaging which showed *** Additional history obtained from *** Previous records obtained and reviewed *** I consulted *** and discussed lab and imaging findings and discussed disposition.  Cardiac monitoring reviewed, *** Social determinants considered, *** Critical Interventions: ***  After the interventions stated above, I reevaluated the patient and found *** Admission and further testing considered, ***   {Document critical care time when appropriate:1} {Document review of labs and clinical decision tools ie heart score, Chads2Vasc2 etc:1}  {Document your independent review of radiology images, and any outside records:1} {Document your discussion with family members, caretakers, and with consultants:1} {Document social determinants of health affecting pt's care:1} {Document your decision making why or why not admission, treatments were needed:1} Final Clinical Impression(s) / ED Diagnoses Final diagnoses:  None    Rx / DC Orders ED Discharge Orders     None

## 2022-10-17 DIAGNOSIS — T50902A Poisoning by unspecified drugs, medicaments and biological substances, intentional self-harm, initial encounter: Secondary | ICD-10-CM

## 2022-10-17 DIAGNOSIS — F319 Bipolar disorder, unspecified: Secondary | ICD-10-CM

## 2022-10-17 DIAGNOSIS — R45851 Suicidal ideations: Secondary | ICD-10-CM

## 2022-10-17 DIAGNOSIS — F430 Acute stress reaction: Secondary | ICD-10-CM

## 2022-10-17 LAB — RESP PANEL BY RT-PCR (RSV, FLU A&B, COVID)  RVPGX2
Influenza A by PCR: NEGATIVE
Influenza B by PCR: NEGATIVE
Resp Syncytial Virus by PCR: NEGATIVE
SARS Coronavirus 2 by RT PCR: NEGATIVE

## 2022-10-17 LAB — POTASSIUM: Potassium: 4.2 mmol/L (ref 3.5–5.1)

## 2022-10-17 LAB — ACETAMINOPHEN LEVEL: Acetaminophen (Tylenol), Serum: <10 ug/mL — ABNORMAL LOW (ref 10–30)

## 2022-10-17 MED ORDER — AMLODIPINE BESYLATE 5 MG PO TABS
5.0000 mg | ORAL_TABLET | Freq: Every day | ORAL | Status: DC
  Administered 2022-10-17 – 2022-10-18 (×2): 5 mg via ORAL
  Filled 2022-10-17 (×2): qty 1

## 2022-10-17 MED ORDER — SODIUM CHLORIDE 0.9 % IV BOLUS
1000.0000 mL | Freq: Once | INTRAVENOUS | Status: AC
  Administered 2022-10-17: 1000 mL via INTRAVENOUS

## 2022-10-17 MED ORDER — FLUOXETINE HCL 20 MG PO CAPS
20.0000 mg | ORAL_CAPSULE | Freq: Every day | ORAL | Status: DC
  Administered 2022-10-17 – 2022-10-18 (×2): 20 mg via ORAL
  Filled 2022-10-17 (×2): qty 1

## 2022-10-17 MED ORDER — NICOTINE 14 MG/24HR TD PT24: 14.0000 mg | MEDICATED_PATCH | Freq: Every day | TRANSDERMAL | Status: DC

## 2022-10-17 MED ORDER — MELATONIN 5 MG PO TABS
10.0000 mg | ORAL_TABLET | Freq: Every day | ORAL | Status: DC
  Administered 2022-10-17: 10 mg via ORAL
  Filled 2022-10-17: qty 2

## 2022-10-17 MED ORDER — LACTATED RINGERS IV BOLUS
1000.0000 mL | Freq: Once | INTRAVENOUS | Status: AC
  Administered 2022-10-17: 1000 mL via INTRAVENOUS

## 2022-10-17 MED ORDER — HYDROXYZINE HCL 25 MG PO TABS: 25.0000 mg | ORAL_TABLET | Freq: Three times a day (TID) | ORAL | Status: DC | PRN

## 2022-10-17 MED ORDER — ACETAMINOPHEN 325 MG PO TABS: 650.0000 mg | ORAL_TABLET | ORAL | Status: DC | PRN

## 2022-10-17 MED ORDER — ALUM & MAG HYDROXIDE-SIMETH 200-200-20 MG/5ML PO SUSP: 30.0000 mL | Freq: Four times a day (QID) | ORAL | Status: DC | PRN

## 2022-10-17 MED ORDER — ONDANSETRON HCL 4 MG PO TABS: 4.0000 mg | ORAL_TABLET | Freq: Three times a day (TID) | ORAL | Status: DC | PRN

## 2022-10-17 MED ORDER — ARIPIPRAZOLE 10 MG PO TABS
10.0000 mg | ORAL_TABLET | Freq: Every day | ORAL | Status: DC
  Administered 2022-10-17: 10 mg via ORAL
  Filled 2022-10-17: qty 1

## 2022-10-17 NOTE — Consult Note (Signed)
[facility] ED ASSESSMENT   Reason for Consult:  Psych Consult Referring Physician:  Dr. Roxanne Mins Patient Identification: Sharon Ward MRN:  [id number] ED Chief Complaint: Overdose  Diagnosis:  Principal Problem:   Overdose Active Problems:   Bipolar disorder, rapid cycling ([place])   Suicidal ideation   ED Assessment Time Calculation: Start Time: 1100 Stop Time: 1145 Total Time in Minutes (Assessment Completion): 45   HPI:  Per Triage Note "Pt BIB EMS. About an hr ago pt took her prescription pills. 20 pills in 2 of the bottles and 30 pills in 2 of the bottles were ingested. Unknown which had 20 or 30. Bottles brought in are hydroxyzine 25 mg each, trazodone 50 mg each, fluoxetine 20 mg each, and aripiprazole 10 mg each. This all started due to some family drama and the pt left her kids father a note."   Subjective: Sharon Ward, 27 y.o., female patient seen face to face by this provider, consulted with Dr. Dwyane Dee; and chart reviewed on 10/17/22.  On evaluation Sharon Ward reports that she has some family issues going on and a lot of other, and I just cannot take it anymore.  Patient states that she has 2 daughters who are in [place] custody, which is extremely hard on her, also she and the baby's daddy are not getting along currently.  She stated that she went to take her children's father's some food yesterday because he said he had no money when she went back to his house because another girl and there, and he told patient she could not stay.  Patient states she lives by herself and does not work.  States that she has family support, of her grandparents, and and they live in [place], [place].  Provider asked patient about the suicidal note she left for daughters, patient just nodded her head and said yeah, and did not want to talk about it.  Patient denies using any illicit drugs or alcohol, states she smokes cigarettes only.  Patient currently denies SI/HI/AVH.  She states her appetite and sleep are  fair, due to anxiety and racing thoughts.  Patient states that she has a psychiatric diagnosis of bipolar, and she takes Abilify, Prozac, Ativan, and melatonin which she is compliant with.  Patient does have a history of being admitted to inpatient psychiatric treatment facilities.  Patient rates her depression as 10 out of 10 with 10 being severe depression.  Patient appears to be very guarded about her upbringing, and about why she does not have custody of her children.  During evaluation Sharon Ward is laying in her hospital bed in no acute distress. She is alert, oriented x 4, calm, cooperative and attentive. Her mood is sad with congruent affect. She has normal speech, and behavior.  Objectively there is no evidence of psychosis/mania or delusional thinking.  Patient is able to converse coherently, goal directed thoughts, no distractibility, or pre-occupation.  She currently denies suicidal/self-harm/homicidal ideation, psychosis, and paranoia.  Patient appears to be very guarded about her upbringing, and about why she does not have custody of her children, but does talk about with provider, how her aunt may be able to adopt her 2 children, which patient is excited about.  She states that her and grandmother are good people and supportive of her, and will be a good home for her daughters.  She states she just became upset because she thought she and boyfriend were trying to work on things and their relationship.  Past Psychiatric History: hx significant  for Bipolar disorder, depressed type, Suicide attempts, self harm behaviors and PTSD. Multiple inpatient Psychiatric Hospitalizations, Suicide attempt.    Risk to Self or Others: Is the patient at risk to self? Yes Has the patient been a risk to self in the past 6 months? Yes Has the patient been a risk to self within the distant past? Yes Is the patient a risk to others? No Has the patient been a risk to others in the past 6 months? No Has the  patient been a risk to others within the distant past? Yes  [place] Scale:  [place] ED from 10/16/2022 in [facility] Emergency Department at [facility] ED from 10/04/2022 in [facility] Admission (Discharged) from 08/30/2022 in [place] [facility]  C-SSRS RISK CATEGORY High Risk No Risk No Risk       AIMS:  , , ,  ,   ASAM:    Substance Abuse:     Past Medical History:  Past Medical History:  Diagnosis Date   Asthma    Bipolar 1 disorder, mixed ([place])    Depression    Generalized anxiety disorder    Intentional drug overdose ([place]) 11/03/2020   Low-lying placenta 01/07/2022   Resolved 03/04/22   Relationship dysfunction    Suicide attempt ([place]) 11/02/2020    Past Surgical History:  Procedure Laterality Date   PILONIDAL CYST / SINUS EXCISION  09/11/2013   PILONIDAL CYST EXCISION  05/17/2014   Pilonidal cystectomy with cleft lip   Family History:  Family History  Problem Relation Age of Onset   Asthma Mother    Diabetes Mother    Healthy Mother    Hypertension Father    Asthma Father    Diabetes Father    Healthy Father    Asthma Brother    Hypertension Paternal Uncle    Diabetes Paternal Grandmother    Stroke Paternal Grandfather    Heart disease Paternal Grandfather    Hypertension Paternal Grandfather    Diabetes Paternal Grandfather     Social History:  Social History   Substance and Sexual Activity  Alcohol Use Not Currently   Comment: occasional prior to pregnancy     Social History   Substance and Sexual Activity  Drug Use Not Currently   Frequency: 4.0 times per week   Types: Marijuana   Comment: No Delta 9 since February 2023    Social History   Socioeconomic History   Marital status: Single    Spouse name: Not on file   Number of children: 1   Years of education: 10   Highest education level: 10th grade  Occupational History   Not on file  Tobacco Use   Smoking  status: Former    Packs/day: 1.00    Years: 15.00    Total pack years: 15.00    Types: Cigarettes    Quit date: 07/14/2021    Years since quitting: 1.2   Smokeless tobacco: Never   Tobacco comments:    only smoke a "couple" of cigarettes when stressed or anxious, socially with friends per [facility] chart  Vaping Use   Vaping Use: Former   Start date: 08/10/2003   Quit date: 05/04/2021  Substance and Sexual Activity   Alcohol use: Not Currently    Comment: occasional prior to pregnancy   Drug use: Not Currently    Frequency: 4.0 times per week    Types: Marijuana    Comment: No Delta 9 since  February 2023   Sexual activity: Not Currently    Partners: Male    Birth control/protection: None  Other Topics Concern   Not on file  Social History Narrative   Not on file   Social Determinants of Health   Financial Resource Strain: Not on file  Food Insecurity: No Food Insecurity (08/31/2022)   Hunger Vital Sign    Worried About Running Out of Food in the Last Year: Never true    Ran Out of Food in the Last Year: Never true  Transportation Needs: No Transportation Needs (08/31/2022)   PRAPARE - Hydrologist (Medical): No    Lack of Transportation (Non-Medical): No  Physical Activity: Not on file  Stress: Not on file  Social Connections: Not on file   Additional Social History: Patient lives alone, smokes cigarettes.    Allergies:   Allergies  Allergen Reactions   Ascorbate Rash   Citrus Rash   Coconut Flavor Rash   Lamotrigine Rash   Orange (Diagnostic) Rash   Peach Flavor Rash   Pear Rash   Pineapple Rash    Labs:  Results for orders placed or performed during the hospital encounter of 10/16/22 (from the past 48 hour(s))  Comprehensive metabolic panel     Status: Abnormal   Collection Time: 10/16/22  8:00 PM  Result Value Ref Range   Sodium 136 135 - 145 mmol/L   Potassium 3.6 3.5 - 5.1 mmol/L   Chloride 106 98 - 111 mmol/L   CO2 24 22 - 32  mmol/L   Glucose, Bld 103 (H) 70 - 99 mg/dL    Comment: Glucose reference range applies only to samples taken after fasting for at least 8 hours.   BUN 16 6 - 20 mg/dL   Creatinine, Ser 0.88 0.44 - 1.00 mg/dL   Calcium 9.2 8.9 - 10.3 mg/dL   Total Protein 7.6 6.5 - 8.1 g/dL   Albumin 4.6 3.5 - 5.0 g/dL   AST 19 15 - 41 U/L   ALT 21 0 - 44 U/L   Alkaline Phosphatase 64 38 - 126 U/L   Total Bilirubin 0.5 0.3 - 1.2 mg/dL   GFR, Estimated >60 >60 mL/min    Comment: (NOTE) Calculated using the CKD-EPI Creatinine Equation (2021)    Anion gap 6 5 - 15    Comment: Performed at [facility], [place] [address], [place], [place] [address]  Ethanol     Status: None   Collection Time: 10/16/22  8:00 PM  Result Value Ref Range   Alcohol, Ethyl (B) <10 <10 mg/dL    Comment: (NOTE) Lowest detectable limit for serum alcohol is 10 mg/dL.  For medical purposes only. Performed at [facility], [place] [address] [place],  [id number]   Salicylate level     Status: Abnormal   Collection Time: 10/16/22  8:00 PM  Result Value Ref Range   Salicylate Lvl Q000111Q (L) 7.0 - 30.0 mg/dL    Comment: Performed at [facility], [place] [address], [place], [place] [address]  Acetaminophen level     Status: Abnormal   Collection Time: 10/16/22  8:00 PM  Result Value Ref Range   Acetaminophen (Tylenol), Serum <10 (L) 10 - 30 ug/mL    Comment: (NOTE) Therapeutic concentrations vary significantly. A range of 10-30 ug/mL  may be an effective concentration for many patients. However, some  are best treated at concentrations outside of this range. Acetaminophen concentrations >  150 ug/mL at 4 hours after ingestion  and >50 ug/mL at 12 hours after ingestion are often associated with  toxic reactions.  Performed at [place], [place] [address] [place], [place] [address]   cbc     Status: None   Collection Time: 10/16/22  8:00 PM  Result  Value Ref Range   WBC 9.2 4.0 - 10.5 K/uL   RBC 4.43 3.87 - 5.11 MIL/uL   Hemoglobin 13.5 12.0 - 15.0 g/dL   HCT 40.5 36.0 - 46.0 %   MCV 91.4 80.0 - 100.0 fL   MCH 30.5 26.0 - 34.0 pg   MCHC 33.3 30.0 - 36.0 g/dL   RDW 12.0 11.5 - 15.5 %   Platelets 274 150 - 400 K/uL   nRBC 0.0 0.0 - 0.2 %    Comment: Performed at [facility], [place] [address], [place], [place] [address]  Rapid urine drug screen (hospital performed)     Status: None   Collection Time: 10/16/22  8:00 PM  Result Value Ref Range   Opiates NONE DETECTED NONE DETECTED   Cocaine NONE DETECTED NONE DETECTED   Benzodiazepines NONE DETECTED NONE DETECTED   Amphetamines NONE DETECTED NONE DETECTED   Tetrahydrocannabinol NONE DETECTED NONE DETECTED   Barbiturates NONE DETECTED NONE DETECTED    Comment: (NOTE) DRUG SCREEN FOR MEDICAL PURPOSES ONLY.  IF CONFIRMATION IS NEEDED FOR ANY PURPOSE, NOTIFY LAB WITHIN 5 DAYS.  LOWEST DETECTABLE LIMITS FOR URINE DRUG SCREEN Drug Class                     Cutoff (ng/mL) Amphetamine and metabolites    1000 Barbiturate and metabolites    200 Benzodiazepine                 200 Opiates and metabolites        300 Cocaine and metabolites        300 THC                            50 Performed at [facility], [place] [address], [place], [place] [address]   Magnesium     Status: None   Collection Time: 10/16/22  8:00 PM  Result Value Ref Range   Magnesium 2.3 1.7 - 2.4 mg/dL    Comment: Performed at [facility], [place] [address], [place], [place] [address]  I-Stat beta hCG blood, ED     Status: None   Collection Time: 10/16/22  8:09 PM  Result Value Ref Range   I-stat hCG, quantitative <5.0 <5 mIU/mL   Comment 3            Comment:   GEST. AGE      CONC.  (mIU/mL)   <=1 WEEK        5 - 50     2 WEEKS       50 - 500     3 WEEKS       100 - 10,000     4 WEEKS     1,000 - 30,000        FEMALE AND NON-PREGNANT FEMALE:     LESS  THAN 5 mIU/mL   Acetaminophen level     Status: Abnormal   Collection Time: 10/16/22 11:59 PM  Result Value Ref Range   Acetaminophen (Tylenol), Serum <10 (L) 10 - 30 ug/mL    Comment: (NOTE)  Therapeutic concentrations vary significantly. A range of 10-30 ug/mL  may be an effective concentration for many patients. However, some  are best treated at concentrations outside of this range. Acetaminophen concentrations >150 ug/mL at 4 hours after ingestion  and >50 ug/mL at 12 hours after ingestion are often associated with  toxic reactions.  Performed at [facility], [place] [address], [place], [place] [address]   Potassium     Status: None   Collection Time: 10/17/22  9:37 AM  Result Value Ref Range   Potassium 4.2 3.5 - 5.1 mmol/L    Comment: Performed at [facility], [place] [address] [place], [place] [address]    Current Facility-Administered Medications  Medication Dose Route Frequency Provider Last Rate Last Admin   acetaminophen (TYLENOL) tablet 650 mg  650 mg Oral [id number] PRN Delora Fuel, MD       alum & mag hydroxide-simeth (MAALOX/MYLANTA) 200-200-20 MG/5ML suspension 30 mL  30 mL Oral [id number] PRN Delora Fuel, MD       amLODipine (NORVASC) tablet 5 mg  5 mg Oral Daily Delora Fuel, MD   5 mg at 10/17/22 [id number]   ARIPiprazole (ABILIFY) tablet 10 mg  10 mg Oral QHS Delora Fuel, MD       FLUoxetine (PROZAC) capsule 20 mg  20 mg Oral Daily Delora Fuel, MD   20 mg at 10/17/22 [id number]   hydrOXYzine (ATARAX) tablet 25 mg  25 mg Oral TID PRN Delora Fuel, MD       melatonin tablet 10 mg  10 mg Oral QHS Delora Fuel, MD       nicotine (NICODERM CQ - dosed in mg/24 hours) patch 14 mg  14 mg Transdermal Daily Delora Fuel, MD       ondansetron [facility]) tablet 4 mg  4 mg Oral [id number] PRN Delora Fuel, MD       Current Outpatient Medications  Medication Sig Dispense Refill   amLODipine (NORVASC) 5 MG tablet Take 1 tablet (5 mg total) by mouth daily. For high blood  pressure 15 tablet 0   ARIPiprazole (ABILIFY) 10 MG tablet Take 1 tablet (10 mg total) by mouth at bedtime. For mood stability 30 tablet 0   FLUoxetine (PROZAC) 20 MG capsule Take 1 capsule (20 mg total) by mouth daily. For depression 30 capsule 0   hydrOXYzine (ATARAX) 25 MG tablet Take 1 tablet (25 mg total) by mouth 3 (three) times daily as needed for anxiety. 75 tablet 0   Melatonin 10 MG TABS Take 10 mg by mouth at bedtime. For sleep 30 tablet 0   nicotine (NICODERM CQ - DOSED IN MG/24 HOURS) 14 mg/24hr patch Place 1 patch (14 mg total) onto the skin daily. (May buy from over the counter): For smoking cessation. 28 patch 0    Musculoskeletal:  Patient observed resting in bed.   Psychiatric Specialty Exam: Presentation  General Appearance:  Appropriate for Environment  Eye Contact: Good  Speech: Clear and Coherent  Speech Volume: Normal  Handedness: Right   Mood and Affect  Mood: Anxious; Depressed  Affect: Flat   Thought Process  Thought Processes: Coherent  Descriptions of Associations:Intact  Orientation:Full (Time, Place and Person)  Thought Content:Logical  History of Schizophrenia/Schizoaffective disorder:No  Duration of Psychotic Symptoms:N/A  Hallucinations:Hallucinations: None  Ideas of Reference:None  Suicidal Thoughts:Suicidal Thoughts: Yes, Passive  Homicidal Thoughts:Homicidal Thoughts: No   Sensorium  Memory: Immediate Good; Remote Good; Recent Good  Judgment: Fair  Insight: Radio broadcast assistant  Functions  Concentration: Good  Attention Span: Good  Recall: Good  Fund of Knowledge: Good  Language: Good   Psychomotor Activity  Psychomotor Activity: Psychomotor Activity: Normal   Assets  Assets: Communication Skills; Housing; Social Support    Sleep  Sleep:No data recorded  Physical Exam: Physical Exam Vitals and nursing note reviewed. Exam conducted with a chaperone present.  Pulmonary:     Effort:  Pulmonary effort is normal.  Neurological:     Mental Status: She is alert.  Psychiatric:        Attention and Perception: Attention normal.        Mood and Affect: Mood is depressed. Affect is flat.        Speech: Speech normal.        Behavior: Behavior is cooperative.        Thought Content: Thought content includes suicidal ideation. Thought content includes suicidal plan.        Cognition and Memory: Memory normal.        Judgment: Judgment is impulsive.    Review of Systems  Constitutional: Negative.   HENT: Negative.    Psychiatric/Behavioral:  Positive for depression and suicidal ideas.    Blood pressure 101/63, pulse 77, temperature 97.9 F (36.6 C), temperature source Oral, resp. rate (!) 21, height '5\' 3"'$  (1.6 m), weight 96.8 kg, SpO2 95 %, not currently breastfeeding. Body mass index is 37.8 kg/m.  Medical Decision Making: Patient will be considered for inpatient Psychiatry care when Medically cleared. Patient BP decreased @ , will talk with EDP.  Patient is a young, female who is going through the trauma of losing her two children and suffering from depression and Bipolar disorder. She has several risk factors, multiple admissions, she also left a suicidal note for daughters. We will resume home medications, after EKG is obtained to evaluate QTC Interval.  [facility] is reviewing patient once medically cleared.   Problem 1: Suicide ideation   Problem 2: Bipolar 2 disorder, depressed       Disposition: Recommend psychiatric Inpatient admission when medically cleared.  Sharon Ward MOTLEY-MANGRUM, PMHNP 10/17/2022 2:05 PM

## 2022-10-17 NOTE — ED Notes (Signed)
Pt ate 75% of her breakfast.

## 2022-10-17 NOTE — ED Notes (Signed)
Pt belonging bags placed in cabinet 16-18. There is one bag placed in the patient belonging bags and are labeled with a pt sticker.

## 2022-10-17 NOTE — ED Provider Notes (Signed)
Notified pt's bp is 101/63.  Currently s/p overdose, waiting for psych admission.  Will give a fluid bolus but pt is taking in po, food and fluids.  Medically stable.   Dorie Rank, MD 10/17/22 1640

## 2022-10-17 NOTE — Progress Notes (Addendum)
UPDATE- Pt has been is under review to be ACCEPTED to [place] 10/18/22 per [facility] AC. [facility] AC will coordinate with care team in the morning.  Pt is under review per [place], RN. [facility] AC will follow and coordinate with care team once pt becomes more stable. Pt meets inpatient behavioral health placement per Michaele Offer, PMHNP   Care Team notified: [facility] Oris Drone, RN, [place], PMHNP, Catha Nottingham, RN, Catha Nottingham, RN  Benjaman Kindler, MSW, [facility] 10/17/2022 4:23 PM

## 2022-10-17 NOTE — ED Notes (Signed)
Pt's one belongings bag moved to [place] number 28.

## 2022-10-17 NOTE — Progress Notes (Signed)
Per [facility]: Robeline, RN-Pt accepted to [facility] 10/18/22; Bed Assignment 404-02.   Room will be ready in AM at 9:30. Please call report to [phone] at that time. Attending physician is Dr. Winfred Leeds. Dx Is Bipolar d/o. Provider, please include admission orders and agitation protocol. Registration, please pre-admit.    Benjaman Kindler, MSW, [facility] 10/17/2022 5:15 PM

## 2022-10-17 NOTE — ED Notes (Signed)
Report given to [facility], RN in [place].

## 2022-10-17 NOTE — ED Notes (Signed)
Pt got up to wash herself off stated she felt dizzy while standing up. Nurse made aware vitals taken blood pressure 93/44

## 2022-10-18 ENCOUNTER — Encounter (HOSPITAL_COMMUNITY): Payer: Self-pay | Admitting: Psychiatry

## 2022-10-18 ENCOUNTER — Other Ambulatory Visit: Payer: Self-pay

## 2022-10-18 ENCOUNTER — Inpatient Hospital Stay (HOSPITAL_COMMUNITY)
Admission: AD | Admit: 2022-10-18 | Discharge: 2022-10-21 | DRG: 885 | Disposition: A | Discharge status: Home / Self Care | Payer: Medicaid Other | Source: Other Acute Inpatient Hospital | Attending: Psychiatry | Admitting: Psychiatry

## 2022-10-18 DIAGNOSIS — R45851 Suicidal ideations: Secondary | ICD-10-CM | POA: Diagnosis not present

## 2022-10-18 DIAGNOSIS — Z9151 Personal history of suicidal behavior: Secondary | ICD-10-CM

## 2022-10-18 DIAGNOSIS — F17210 Nicotine dependence, cigarettes, uncomplicated: Secondary | ICD-10-CM | POA: Diagnosis present

## 2022-10-18 DIAGNOSIS — F430 Acute stress reaction: Secondary | ICD-10-CM | POA: Diagnosis not present

## 2022-10-18 DIAGNOSIS — F3160 Bipolar disorder, current episode mixed, unspecified: Secondary | ICD-10-CM | POA: Diagnosis present

## 2022-10-18 DIAGNOSIS — T43212A Poisoning by selective serotonin and norepinephrine reuptake inhibitors, intentional self-harm, initial encounter: Secondary | ICD-10-CM | POA: Diagnosis present

## 2022-10-18 DIAGNOSIS — F411 Generalized anxiety disorder: Secondary | ICD-10-CM | POA: Diagnosis present

## 2022-10-18 DIAGNOSIS — T43222A Poisoning by selective serotonin reuptake inhibitors, intentional self-harm, initial encounter: Secondary | ICD-10-CM | POA: Diagnosis present

## 2022-10-18 DIAGNOSIS — T43592A Poisoning by other antipsychotics and neuroleptics, intentional self-harm, initial encounter: Secondary | ICD-10-CM | POA: Diagnosis present

## 2022-10-18 DIAGNOSIS — F3181 Bipolar II disorder: Principal | ICD-10-CM | POA: Diagnosis present

## 2022-10-18 DIAGNOSIS — Z818 Family history of other mental and behavioral disorders: Secondary | ICD-10-CM

## 2022-10-18 DIAGNOSIS — F319 Bipolar disorder, unspecified: Secondary | ICD-10-CM | POA: Diagnosis not present

## 2022-10-18 DIAGNOSIS — G4700 Insomnia, unspecified: Secondary | ICD-10-CM | POA: Diagnosis present

## 2022-10-18 DIAGNOSIS — Z20822 Contact with and (suspected) exposure to covid-19: Secondary | ICD-10-CM | POA: Diagnosis present

## 2022-10-18 DIAGNOSIS — F3163 Bipolar disorder, current episode mixed, severe, without psychotic features: Secondary | ICD-10-CM | POA: Diagnosis present

## 2022-10-18 DIAGNOSIS — T50902A Poisoning by unspecified drugs, medicaments and biological substances, intentional self-harm, initial encounter: Secondary | ICD-10-CM | POA: Diagnosis not present

## 2022-10-18 DIAGNOSIS — F603 Borderline personality disorder: Secondary | ICD-10-CM | POA: Diagnosis present

## 2022-10-18 DIAGNOSIS — Z87730 Personal history of (corrected) cleft lip and palate: Secondary | ICD-10-CM

## 2022-10-18 DIAGNOSIS — J45909 Unspecified asthma, uncomplicated: Secondary | ICD-10-CM | POA: Diagnosis present

## 2022-10-18 DIAGNOSIS — Z79899 Other long term (current) drug therapy: Secondary | ICD-10-CM | POA: Diagnosis not present

## 2022-10-18 MED ORDER — TRAZODONE HCL 50 MG PO TABS
50.0000 mg | ORAL_TABLET | Freq: Every evening | ORAL | Status: DC | PRN
  Administered 2022-10-19 – 2022-10-20 (×2): 50 mg via ORAL
  Filled 2022-10-18 (×2): qty 1

## 2022-10-18 MED ORDER — DIPHENHYDRAMINE HCL 50 MG/ML IJ SOLN: 50.0000 mg | Freq: Three times a day (TID) | INTRAMUSCULAR | Status: DC | PRN

## 2022-10-18 MED ORDER — HYDROXYZINE HCL 25 MG PO TABS
25.0000 mg | ORAL_TABLET | Freq: Three times a day (TID) | ORAL | Status: DC | PRN
  Administered 2022-10-18: 25 mg via ORAL
  Filled 2022-10-18: qty 1

## 2022-10-18 MED ORDER — DIPHENHYDRAMINE HCL 25 MG PO CAPS: 50.0000 mg | ORAL_CAPSULE | Freq: Three times a day (TID) | ORAL | Status: DC | PRN

## 2022-10-18 MED ORDER — MAGNESIUM HYDROXIDE 400 MG/5ML PO SUSP: 30.0000 mL | Freq: Every day | ORAL | Status: DC | PRN

## 2022-10-18 MED ORDER — ACETAMINOPHEN 325 MG PO TABS
650.0000 mg | ORAL_TABLET | Freq: Four times a day (QID) | ORAL | Status: DC | PRN
  Administered 2022-10-21: 650 mg via ORAL
  Filled 2022-10-18 (×2): qty 2

## 2022-10-18 MED ORDER — LORAZEPAM 2 MG/ML IJ SOLN: 2.0000 mg | Freq: Three times a day (TID) | INTRAMUSCULAR | Status: DC | PRN

## 2022-10-18 MED ORDER — AMLODIPINE BESYLATE 5 MG PO TABS
5.0000 mg | ORAL_TABLET | Freq: Every day | ORAL | Status: DC
  Filled 2022-10-18: qty 1

## 2022-10-18 MED ORDER — MELATONIN 5 MG PO TABS
10.0000 mg | ORAL_TABLET | Freq: Every day | ORAL | Status: DC
  Filled 2022-10-18: qty 2

## 2022-10-18 MED ORDER — FLUOXETINE HCL 20 MG PO CAPS
20.0000 mg | ORAL_CAPSULE | Freq: Every day | ORAL | Status: DC
  Filled 2022-10-18: qty 1

## 2022-10-18 MED ORDER — ALUM & MAG HYDROXIDE-SIMETH 200-200-20 MG/5ML PO SUSP: 30.0000 mL | ORAL | Status: DC | PRN

## 2022-10-18 MED ORDER — ARIPIPRAZOLE 10 MG PO TABS
10.0000 mg | ORAL_TABLET | Freq: Every day | ORAL | Status: DC
  Filled 2022-10-18: qty 1

## 2022-10-18 MED ORDER — ALBUTEROL SULFATE HFA 108 (90 BASE) MCG/ACT IN AERS: 1.0000 | INHALATION_SPRAY | Freq: Four times a day (QID) | RESPIRATORY_TRACT | Status: DC | PRN

## 2022-10-18 MED ORDER — LORAZEPAM 1 MG PO TABS: 2.0000 mg | ORAL_TABLET | Freq: Three times a day (TID) | ORAL | Status: DC | PRN

## 2022-10-18 MED ORDER — NICOTINE POLACRILEX 2 MG MT GUM
2.0000 mg | CHEWING_GUM | OROMUCOSAL | Status: DC | PRN
  Administered 2022-10-19 (×2): 2 mg via ORAL
  Filled 2022-10-18: qty 1
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
## 2022-10-18 NOTE — Progress Notes (Signed)
   10/18/22 2200  Psych Admission Type (Psych Patients Only)  Admission Status Voluntary  Psychosocial Assessment  Patient Complaints Anxiety;Depression;Crying spells  Eye Contact Fair  Facial Expression Sad  Affect Appropriate to circumstance;Sad  Speech Logical/coherent  Interaction Assertive  Motor Activity Slow  Appearance/Hygiene Unremarkable  Behavior Characteristics Cooperative;Unable to participate;Anxious  Mood Sad;Depressed;Anxious;Pleasant  Thought Process  Coherency WDL  Content WDL  Delusions None reported or observed  Perception WDL  Hallucination None reported or observed  Judgment WDL  Confusion None  Danger to Self  Current suicidal ideation? Denies  Self-Injurious Behavior No self-injurious ideation or behavior indicators observed or expressed   Agreement Not to Harm Self Yes  Description of Agreement verbal  Danger to Others  Danger to Others None reported or observed  Danger to Others Abnormal  Harmful Behavior to others No threats or harm toward other people  Destructive Behavior No threats or harm toward property

## 2022-10-18 NOTE — ED Provider Notes (Signed)
Emergency Medicine Observation Re-evaluation Note  Sharon Ward is a 27 y.o. female, seen on rounds today.  Pt initially presented to the ED for complaints of depression.  Pt has been accepted to [facility]. No new c/o this AM.   Physical Exam  BP 109/64   Pulse (!) 53   Temp 97.8 F (36.6 C) (Oral)   Resp 14   Ht 1.6 m ('5\' 3"'$ )   Wt 96.8 kg   SpO2 97%   BMI 37.80 kg/m  Physical Exam General: resting.  Cardiac: regular rate.  Lungs: breathing comfortably. Psych: resting, calm.   ED Course / MDM    I have reviewed the labs performed to date as well as medications administered while in observation.  Recent changes in the last 24 hours include ED obs, reassessment.   Plan  Pt has been accepted at [facility], Dr Winfred Leeds.  Pt currently appears stable for admit/transfer.     Lajean Saver, MD 10/18/22 [phone]

## 2022-10-18 NOTE — H&P (Signed)
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 times daily as needed for anxiety. 75 tablet 0    Melatonin 10 MG TABS Take 10 mg by mouth at bedtime. For sleep 30 tablet 0    nicotine (NICODERM CQ - DOSED IN MG/24 HOURS) 14 mg/24hr patch Place 1 patch (14 mg total) onto the skin daily. (May buy from over the counter): For smoking cessation. 28 patch 0     Musculoskeletal: Strength & Muscle Tone: within normal limits Gait & Station: normal Patient leans:  N/A            Psychiatric Specialty Exam:  Presentation  General Appearance:  Fairly Groomed  Eye Contact: Good  Speech: Clear and Coherent  Speech Volume: Normal  Handedness: Right  Mood and Affect  Mood: Anxious; Depressed  Affect: Congruent  Thought Process  Thought Processes: Coherent  Duration of Psychotic Symptoms:N/A Past Diagnosis of Schizophrenia or Psychoactive disorder: No  Descriptions of Associations:Intact  Orientation:Full (Time, Place and Person)  Thought Content:Logical  Hallucinations:Hallucinations: None  Ideas of Reference:None  Suicidal Thoughts:Suicidal Thoughts: No  Homicidal Thoughts:Homicidal Thoughts: No  Sensorium  Memory: Immediate Good  Judgment: Fair  Insight: Fair  Community education officer  Concentration: Fair  Attention Span: Fair  Recall: [place] of Knowledge: Fair  Language: Fair  Psychomotor Activity  Psychomotor Activity: Psychomotor Activity: Normal  Assets  Assets: Communication Skills  Sleep  Sleep:Sleep: Poor   Physical Exam: Physical Exam Constitutional:      Appearance: Normal appearance.  HENT:     Head: Normocephalic.     Nose: Nose normal.  Eyes:     Pupils: Pupils are equal, round, and reactive to light.  Neurological:     Mental Status: She is alert and oriented to person, place, and time.    Review of Systems  Constitutional:  Negative for fever.  HENT:  Negative for hearing loss.   Eyes: Negative.  Negative for blurred vision.  Respiratory: Negative.  Negative for cough.   Cardiovascular: Negative.  Negative for chest pain.  Gastrointestinal:  Negative for heartburn.  Genitourinary: Negative.   Musculoskeletal: Negative.  Negative for myalgias.  Skin:  Negative for rash.  Neurological:  Negative for dizziness.  Psychiatric/Behavioral:  Positive for depression. Negative for hallucinations, memory loss, substance abuse and suicidal ideas. The patient is  nervous/anxious and has insomnia.    Blood pressure (!) 98/58, pulse 70, temperature 97.7 F (36.5 C), temperature source Oral, resp. rate 16, height '5\' 2"'$  (1.575 m), weight 96.2 kg, SpO2 98 %, not currently breastfeeding. Body mass index is 38.78 kg/m.  Treatment Plan Summary: Daily contact with patient to assess and evaluate symptoms and progress in treatment and Medication management  Observation Level/Precautions:  15 minute checks  Laboratory:  Labs reviewed   Psychotherapy:  Unit Group sessions  Medications:  See [facility]  Consultations:  To be determined   Discharge Concerns:  Safety, medication compliance, mood stability  Estimated LOS: 5-7 days  Other:  N/A   Labs Reviewed: EKG with QTC prolonged at 488, needs to be repeated tomorrow, 10/19/2022.  PLAN Safety and Monitoring: Voluntary admission to inpatient psychiatric unit for safety, stabilization and treatment Daily contact with patient to assess and evaluate symptoms and progress in treatment Patient's case to be discussed in multi-disciplinary team meeting Observation Level : q15 minute checks Vital signs: q12 hours Precautions: Safety  Long Term Goal(s): Improvement in symptoms so as ready for discharge  Short Term Goals: Ability to identify changes in lifestyle to reduce recurrence of condition will improve, Ability to disclose and discuss suicidal ideas, Ability to demonstrate self-control will improve, Ability to identify and develop effective coping behaviors will improve, Ability to maintain clinical measurements within normal limits will improve, Compliance with prescribed medications will improve, and Ability to identify triggers associated with substance abuse/mental health issues will improve  Diagnoses:  Principal Problem:   Bipolar 2 disorder, major depressive episode ([place]) Active Problems:   Asthma   GAD (generalized anxiety disorder)   Insomnia  Medications  -Discontinue Prozac-patient states she  overdosed on this medication, risk of NMS and serotonin syndrome -Discontinue Abilify due to patient's recent overdose on this medication-we will allow for washout period. -Discontinue Norvasc-hypotension has been persistent due to recent overdose on medication -Continue trazodone 50 mg as needed nightly for sleep (hold if continues to be hypotensive) -Continue Ativan 2 mg p.o. or IM 3 times daily as needed for agitation   PRNS -Start Albuterol inhaler PRN for wheezing/SOB Q 6 H -Start Nicorette gum PRN Q 2 H -Continue Tylenol 650 mg every 6 hours PRN for mild pain -Continue Maalox 30 mg every 4 hrs PRN for indigestion -Continue Milk of Magnesia as needed every 6 hrs for constipation   Patient educated about all medication rationales, benefits and possible side effects, verbalizes understanding.  Discharge Planning: Social work and case management to assist with discharge planning and identification of hospital follow-up needs prior to discharge Estimated LOS: 5-7 days Discharge Concerns: Need to establish a safety plan; Medication compliance and effectiveness Discharge Goals: Return home with outpatient referrals for mental health follow-up including medication management/psychotherapy  I certify that inpatient services furnished can reasonably be expected to improve the patient's condition.    Nicholes Rough, [place] 3/11/20247:04 PM

## 2022-10-18 NOTE — BHH Group Notes (Signed)
Spiritual care group on grief and loss facilitated by Chaplain Janne Napoleon, Bcc and Lysle Morales, counseling intern.  Group Goal: Support / Education around grief and loss  Members engage in facilitated group support and psycho-social education.  Group Description:  Following introductions and group rules, group members engaged in facilitated group dialogue and support around topic of loss, with particular support around experiences of loss in their lives. Group Identified types of loss (relationships / self / things) and identified patterns, circumstances, and changes that precipitate losses. Reflected on thoughts / feelings around loss, normalized grief responses, and recognized variety in grief experience. Group encouraged individual reflection on safe space and on the coping skills that they are already utilizing.  Group drew on Adlerian / Rogerian and narrative framework  Patient Progress: Sharon Ward attended the last part of group.  She was engaged and participated in the group activities and conversation.  [address] [place] Pager, [phone]

## 2022-10-18 NOTE — Progress Notes (Signed)
   10/18/22 1218  Psych Admission Type (Psych Patients Only)  Admission Status Voluntary  Psychosocial Assessment  Patient Complaints Anxiety  Eye Contact Fair  Facial Expression Sad  Affect Flat  Speech Logical/coherent  Interaction Assertive  Motor Activity Other (Comment) (WNL)  Behavior Characteristics Cooperative;Appropriate to situation  Mood Pleasant  Thought Process  Coherency WDL  Content WDL  Delusions None reported or observed  Perception WDL  Hallucination None reported or observed  Judgment WDL  Confusion WDL  Danger to Self  Current suicidal ideation? Denies  Self-Injurious Behavior No self-injurious ideation or behavior indicators observed or expressed   Agreement Not to Harm Self Yes  Description of Agreement verbal  Danger to Others  Danger to Others None reported or observed  Danger to Others Abnormal  Harmful Behavior to others No threats or harm toward other people  Destructive Behavior No threats or harm toward property

## 2022-10-18 NOTE — Group Note (Signed)
Occupational Therapy Group Note  Group Topic:Coping Skills  Group Date: 10/18/2022 Start Time: 1430 End Time: 1500 Facilitators: Brantley Stage, OT   Group Description: Group encouraged increased engagement and participation through discussion and activity focused on "Coping Ahead." Patients were split up into teams and selected a card from a stack of positive coping strategies. Patients were instructed to act out/charade the coping skill for other peers to guess and receive points for their team. Discussion followed with a focus on identifying additional positive coping strategies and patients shared how they were going to cope ahead over the weekend while continuing hospitalization stay.  Therapeutic Goal(s): Identify positive vs negative coping strategies. Identify coping skills to be used during hospitalization vs coping skills outside of hospital/at home Increase participation in therapeutic group environment and promote engagement in treatment   Participation Level: Engaged   Participation Quality: Independent   Behavior: Appropriate   Speech/Thought Process: Focused   Affect/Mood: Appropriate   Insight: Fair   Judgement: Fair   Individualization: pt was engaged in their participation of group discussion/activity. New skills were identified  Modes of Intervention: Education  Patient Response to Interventions:  Attentive   Plan: Continue to engage patient in OT groups 2 - 3x/week.  10/18/2022  Brantley Stage, OT Cornell Barman, OT

## 2022-10-18 NOTE — BHH Suicide Risk Assessment (Signed)
Suicide Risk Assessment  Admission Assessment    [facility] Admission Suicide Risk Assessment   Nursing information obtained from:  Patient Demographic factors:  Caucasian, Adolescent or young adult, Living alone Current Mental Status:  Suicidal ideation indicated by others Loss Factors:  Loss of significant relationship Historical Factors:  Impulsivity (Kids in DSS custody) Risk Reduction Factors:  Positive social support  Total Time spent with patient: 1.5 hours Principal Problem: Bipolar 2 disorder, major depressive episode ([place]) Diagnosis:  Principal Problem:   Bipolar 2 disorder, major depressive episode ([place]) Active Problems:   Asthma   GAD (generalized anxiety disorder)   Insomnia  Subjective Data: "I overdosed on all of my medications".   Continued Clinical Symptoms: Extremely depressed mood due to multiple stressors. in need of continuous hospitalization for treatment and stabilization.  Alcohol Use Disorder Identification Test Final Score (AUDIT): 0 The "Alcohol Use Disorders Identification Test", Guidelines for Use in Primary Care, Second Edition.  World Pharmacologist [facility]). Score between 0-7:  no or low risk or alcohol related problems. Score between 8-15:  moderate risk of alcohol related problems. Score between 16-19:  high risk of alcohol related problems. Score 20 or above:  warrants further diagnostic evaluation for alcohol dependence and treatment.  CLINICAL FACTORS:   Bipolar Disorder:   Depressive phase Depression:   Anhedonia Hopelessness Impulsivity Insomnia Severe  Musculoskeletal: Strength & Muscle Tone: within normal limits Gait & Station: normal Patient leans: N/A  Psychiatric Specialty Exam:  Presentation  General Appearance:  Fairly Groomed  Eye Contact: Good  Speech: Clear and Coherent  Speech Volume: Normal  Handedness: Right   Mood and Affect  Mood: Anxious; Depressed  Affect: Congruent   Thought Process  Thought  Processes: Coherent  Descriptions of Associations:Intact  Orientation:Full (Time, Place and Person)  Thought Content:Logical  History of Schizophrenia/Schizoaffective disorder:No  Duration of Psychotic Symptoms:N/A  Hallucinations:Hallucinations: None  Ideas of Reference:None  Suicidal Thoughts:Suicidal Thoughts: No  Homicidal Thoughts:Homicidal Thoughts: No   Sensorium  Memory: Immediate Good  Judgment: Fair  Insight: Fair   Community education officer  Concentration: Fair  Attention Span: Fair  Recall: [employer] of Knowledge: Fair  Language: Fair   Psychomotor Activity  Psychomotor Activity: Psychomotor Activity: Normal   Assets  Assets: Communication Skills   Sleep  Sleep: Sleep: Poor    Physical Exam: Physical Exam Constitutional:      Appearance: Normal appearance.  Musculoskeletal:        General: Normal range of motion.  Neurological:     Mental Status: She is alert.    Review of Systems  Constitutional: Negative.   HENT: Negative.    Eyes: Negative.   Respiratory: Negative.    Cardiovascular: Negative.   Genitourinary: Negative.   Musculoskeletal: Negative.   Skin: Negative.   Neurological: Negative.   Psychiatric/Behavioral:  Positive for depression. Negative for hallucinations, memory loss, substance abuse and suicidal ideas. The patient is nervous/anxious and has insomnia.    Blood pressure (!) 98/58, pulse 70, temperature 97.7 F (36.5 C), temperature source Oral, resp. rate 16, height '5\' 2"'$  (1.575 m), weight 96.2 kg, SpO2 98 %, not currently breastfeeding. Body mass index is 38.78 kg/m.   COGNITIVE FEATURES THAT CONTRIBUTE TO RISK:  None    SUICIDE RISK:    Moderate: Currently denies SI, is impulsive and recent suicide attempt prior to admission, along with the loss of her protective factors to DSS (her children) & her impulsive nature place, along with lack of close family support person  patient at a moderate risk  of suicide.    PLAN OF CARE: Please see H & P  I certify that inpatient services furnished can reasonably be expected to improve the patient's condition.   Nicholes Rough, NP 10/18/2022, 7:10 PM

## 2022-10-19 LAB — TSH: TSH: 5.03 u[IU]/mL — ABNORMAL HIGH (ref 0.350–4.500)

## 2022-10-19 LAB — LIPID PANEL
Cholesterol: 185 mg/dL (ref 0–200)
HDL: 61 mg/dL (ref >40)
LDL Cholesterol: 101 mg/dL — ABNORMAL HIGH (ref 0–99)
Total CHOL/HDL Ratio: 3 RATIO
Triglycerides: 117 mg/dL (ref <150)
VLDL: 23 mg/dL (ref 0–40)

## 2022-10-19 LAB — HEMOGLOBIN A1C
Hgb A1c MFr Bld: 5.2 % (ref 4.8–5.6)
Mean Plasma Glucose: 103 mg/dL

## 2022-10-19 MED ORDER — HYDROXYZINE HCL 25 MG PO TABS
25.0000 mg | ORAL_TABLET | Freq: Three times a day (TID) | ORAL | Status: DC | PRN
  Administered 2022-10-19 – 2022-10-20 (×2): 25 mg via ORAL
  Filled 2022-10-19 (×2): qty 1

## 2022-10-19 MED ORDER — ARIPIPRAZOLE 2 MG PO TABS
2.0000 mg | ORAL_TABLET | Freq: Every day | ORAL | Status: DC
  Administered 2022-10-19: 2 mg via ORAL
  Filled 2022-10-19 (×2): qty 1

## 2022-10-19 MED ORDER — WHITE PETROLATUM EX OINT
TOPICAL_OINTMENT | CUTANEOUS | Status: AC
  Administered 2022-10-19: 1
  Filled 2022-10-19: qty 5

## 2022-10-19 NOTE — BHH Suicide Risk Assessment (Signed)
[facility] INPATIENT:  Family/Significant Other Suicide Prevention Education  Suicide Prevention Education:  Education Completed; [facility] [phone] has been identified by the patient as the family member/significant other with whom the patient will be residing, and identified as the person(s) who will aid the patient in the event of a mental health crisis (suicidal ideations/suicide attempt).  With written consent from the patient, the family member/significant other has been provided the following suicide prevention education, prior to the and/or following the discharge of the patient.  The suicide prevention education provided includes the following: Suicide risk factors Suicide prevention and interventions National Suicide Hotline telephone number [facility] assessment telephone number [facility] Emergency Assistance [place] and/or Residential Mobile Crisis Unit telephone number  Request made of family/significant other to: Remove weapons (e.g., guns, rifles, knives), all items previously/currently identified as safety concern.   Remove drugs/medications (over-the-counter, prescriptions, illicit drugs), all items previously/currently identified as a safety concern.  The family member/significant other verbalizes understanding of the suicide prevention education information provided.  The family member/significant other agrees to remove the items of safety concern listed above.  CSW spoke with a representative of [facility] who states that the Pt has been referred to them from her previous ACT team.  They state that the Pt has begun the process of changing to their ACT services but has not been able to finalize that process.  They state that they will contact the Pt while she is in the hospital by phone or in person to complete this process so the Pt can have resources upon discharge.  Once established the Pt would have therapy, medication management, and peer  support through the ACT Team.  CSW will follow up with [facility] prior to discharge.   Frutoso Chase Tawan Corkern 10/19/2022, 1:19 PM

## 2022-10-19 NOTE — Progress Notes (Signed)
   10/19/22 2300  Psych Admission Type (Psych Patients Only)  Admission Status Voluntary/72 hour document signed  Date 72 hour document signed  10/19/22  Time 72 hour document signed  1831  Psychosocial Assessment  Patient Complaints Anxiety;Depression  Eye Contact Fair  Facial Expression Animated;Sad  Affect Appropriate to circumstance;Sad  Speech Logical/coherent  Interaction Needy;Attention-seeking;Assertive  Motor Activity Slow  Appearance/Hygiene Revealing clothes/seductive clothing  Behavior Characteristics Cooperative;Anxious  Mood Depressed;Sad;Pleasant  Thought Process  Coherency WDL  Content WDL  Delusions None reported or observed  Perception WDL  Hallucination None reported or observed  Judgment Poor  Confusion None  Danger to Self  Current suicidal ideation? Denies  Self-Injurious Behavior No self-injurious ideation or behavior indicators observed or expressed   Agreement Not to Harm Self Yes  Description of Agreement verbal  Danger to Others  Danger to Others None reported or observed  Danger to Others Abnormal  Harmful Behavior to others No threats or harm toward other people  Destructive Behavior No threats or harm toward property

## 2022-10-19 NOTE — Group Note (Signed)
Recreation Therapy Group Note   Group Topic:Animal Assisted Therapy   Group Date: 10/19/2022 Start Time: [id number] End Time: 1033 Facilitators: Dedria Endres-McCall, LRT,CTRS Location: [address] Dayroom   Animal-Assisted Activity (AAA) Program Checklist/Progress Notes Patient Eligibility Criteria Checklist & Daily Group note for Rec Tx Intervention  AAA/T Program Assumption of Risk Form signed by Patient/ or Parent Legal Guardian Yes  Patient is free of allergies or severe asthma Yes  Patient reports no fear of animals Yes  Patient reports no history of cruelty to animals Yes  Patient understands his/her participation is voluntary Yes  Patient washes hands before animal contact Yes  Patient washes hands after animal contact Yes   Affect/Mood: Appropriate   Participation Level: Engaged   Participation Quality: Independent   Behavior: Appropriate    Clinical Observations/Individualized Feedback: Patient attended session and interacted appropriately with therapy dog and peers. Patient asked appropriate questions about therapy dog and his training. Patient shared stories about their pets at home with group.     Plan: Continue to engage patient in RT group sessions 2-3x/week.   Sharon Ward, LRT,CTRS  10/19/2022 1:14 PM

## 2022-10-19 NOTE — Progress Notes (Signed)
   10/19/22 6203  15 Minute Checks  Location Dayroom  Visual Appearance Calm  Behavior Composed  Sleep (Behavioral Health Patients Only)  Calculate sleep? (Click Yes once per 24 hr at 0600 safety check) Yes  Documented sleep last 24 hours 7.5

## 2022-10-19 NOTE — Group Note (Signed)
LCSW Group Therapy Note   Group Date: 10/19/2022 Start Time: 1100 End Time: 1200  Type of Therapy and Topic: Group Therapy: Effective Communication   Participation Level: Active   Description of Group:  In this group patients will be asked to identify their own styles of communication as well as defining and identifying passive, assertive, and aggressive styles of communication. Participants will identify strategies to communicate in a more assertive manner in an effort to appropriately meet their needs. This group will be process-oriented, with patients participating in exploration of their own experiences as well as giving and receiving support and challenge from other group members.   Therapeutic Goals: 1. Patient will identify their personal communication style. 2. Patient will identify passive, assertive, and aggressive forms of communication. 3. Patient will identify strategies for developing more effective communication to appropriately meet their needs.    Summary of Patient Progress: The Pt attended group and remained there the entire time. The Pt accepted all worksheets and materials that were provided.  The Pt was appropriate with their peers and staff.  The Pt demonstrated understanding of the topic being discussed by asking questions and sharing their thoughts and feelings with their peers and staff.      Therapeutic Modalities:  Communication Skills Solution Focused Therapy Motivational Interviewing  Darleen Crocker, [place] 10/19/2022  1:09 PM

## 2022-10-19 NOTE — Progress Notes (Deleted)
   10/19/22 2300  Psych Admission Type (Psych Patients Only)  Admission Status Voluntary  Psychosocial Assessment  Patient Complaints Anxiety;Depression  Eye Contact Fair  Facial Expression Animated;Sad  Affect Appropriate to circumstance;Sad  Speech Logical/coherent  Interaction Needy;Attention-seeking;Assertive  Motor Activity Slow  Appearance/Hygiene Revealing clothes/seductive clothing  Behavior Characteristics Cooperative;Anxious  Mood Depressed;Sad;Pleasant  Thought Process  Coherency WDL  Content WDL  Delusions None reported or observed  Perception WDL  Hallucination None reported or observed  Judgment Poor  Confusion None  Danger to Self  Current suicidal ideation? Denies  Self-Injurious Behavior No self-injurious ideation or behavior indicators observed or expressed   Agreement Not to Harm Self Yes  Description of Agreement verbal  Danger to Others  Danger to Others None reported or observed  Danger to Others Abnormal  Harmful Behavior to others No threats or harm toward other people  Destructive Behavior No threats or harm toward property
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
## 2022-10-19 NOTE — Progress Notes (Signed)
Pt signed a 72-hour request for discharge form on 10/19/22 at 1831.

## 2022-10-19 NOTE — Progress Notes (Addendum)
Pt denied SI/HI/AVH today. Pt interacting well on the unit, interacting with peers and staff. Pt complained about "having a lot of thoughts about the past" following lunch. RN provided support and encouragement to patient. Pt given scheduled medications as prescribed. Q15 min checks verified for safety. Patient verbally contracts for safety. Patient compliant with medications and treatment plan. Pt is safe on the unit.   10/19/22 1000  Psych Admission Type (Psych Patients Only)  Admission Status Voluntary  Psychosocial Assessment  Patient Complaints Anxiety;Depression  Eye Contact Fair  Facial Expression Sad  Affect Appropriate to circumstance  Speech Logical/coherent  Interaction Assertive;Needy  Motor Activity Slow  Appearance/Hygiene Unremarkable  Behavior Characteristics Cooperative;Appropriate to situation  Mood Depressed;Sad  Thought Process  Coherency WDL  Content WDL  Delusions None reported or observed  Perception WDL  Hallucination None reported or observed  Judgment Poor  Confusion None  Danger to Self  Current suicidal ideation? Denies  Description of Suicide Plan No plan  Self-Injurious Behavior No self-injurious ideation or behavior indicators observed or expressed   Agreement Not to Harm Self Yes  Description of Agreement Pt verbally contracts for safety  Danger to Others  Danger to Others None reported or observed  Danger to Others Abnormal  Harmful Behavior to others No threats or harm toward other people  Destructive Behavior No threats or harm toward property

## 2022-10-19 NOTE — Progress Notes (Signed)
Psychoeducational Group Note  Date:  10/19/2022 Time:  2100  Group Topic/Focus:  Wrap-Up Group:   The focus of this group is to help patients review their daily goal of treatment and discuss progress on daily workbooks.  Participation Level: Did Not Attend  Participation Quality:  Not Applicable  Affect:  Not Applicable  Cognitive:  Not Applicable  Insight:  Not Applicable  Engagement in Group: Not Applicable  Additional Comments:  The patient did not attend group this evening.   Jeneva Schweizer S 10/19/2022, 9:00 PM

## 2022-10-20 ENCOUNTER — Encounter (HOSPITAL_COMMUNITY): Payer: Self-pay

## 2022-10-20 MED ORDER — ARIPIPRAZOLE 5 MG PO TABS
5.0000 mg | ORAL_TABLET | Freq: Every day | ORAL | Status: DC
  Administered 2022-10-20: 5 mg via ORAL
  Filled 2022-10-20 (×3): qty 1

## 2022-10-20 NOTE — BH IP Treatment Plan (Signed)
Interdisciplinary Treatment and Diagnostic Plan Update  10/20/2022 Time of Session: 9:40am  Sharon Ward MRN: [id number]  Principal Diagnosis: Bipolar 2 disorder, major depressive episode ([place])  Secondary Diagnoses: Principal Problem:   Bipolar 2 disorder, major depressive episode ([place]) Active Problems:   Asthma   GAD (generalized anxiety disorder)   Insomnia   Current Medications:  Current Facility-Administered Medications  Medication Dose Route Frequency Provider Last Rate Last Admin   acetaminophen (TYLENOL) tablet 650 mg  650 mg Oral Q6H PRN Nkwenti, Doris, NP       albuterol (VENTOLIN HFA) 108 (90 Base) MCG/ACT inhaler 1 puff  1 puff Inhalation Q6H PRN Nkwenti, Doris, NP       alum & mag hydroxide-simeth (MAALOX/MYLANTA) 200-200-20 MG/5ML suspension 30 mL  30 mL Oral Q4H PRN Motley-Mangrum, Jadeka A, PMHNP       ARIPiprazole (ABILIFY) tablet 5 mg  5 mg Oral QHS Leevy-Johnson, Brooke A, NP       hydrOXYzine (ATARAX) tablet 25 mg  25 mg Oral TID PRN Janine Limbo, MD   25 mg at 10/19/22 2035   LORazepam (ATIVAN) tablet 2 mg  2 mg Oral TID PRN Motley-Mangrum, Donneta Romberg A, PMHNP       Or   LORazepam (ATIVAN) injection 2 mg  2 mg Intramuscular TID PRN Motley-Mangrum, Jadeka A, PMHNP       magnesium hydroxide (MILK OF MAGNESIA) suspension 30 mL  30 mL Oral Daily PRN Motley-Mangrum, Jadeka A, PMHNP       nicotine polacrilex (NICORETTE) gum 2 mg  2 mg Oral PRN Nicholes Rough, NP   2 mg at 10/19/22 1640   traZODone (DESYREL) tablet 50 mg  50 mg Oral QHS PRN Motley-Mangrum, Jadeka A, PMHNP   50 mg at 10/19/22 2035   PTA Medications: Medications Prior to Admission  Medication Sig Dispense Refill Last Dose   amLODipine (NORVASC) 5 MG tablet Take 1 tablet (5 mg total) by mouth daily. For high blood pressure 15 tablet 0    ARIPiprazole (ABILIFY) 10 MG tablet Take 1 tablet (10 mg total) by mouth at bedtime. For mood stability 30 tablet 0    FLUoxetine (PROZAC) 20 MG capsule Take 1  capsule (20 mg total) by mouth daily. For depression 30 capsule 0    hydrOXYzine (ATARAX) 25 MG tablet Take 1 tablet (25 mg total) by mouth 3 (three) times daily as needed for anxiety. 75 tablet 0    Melatonin 10 MG TABS Take 10 mg by mouth at bedtime. For sleep 30 tablet 0    nicotine (NICODERM CQ - DOSED IN MG/24 HOURS) 14 mg/24hr patch Place 1 patch (14 mg total) onto the skin daily. (May buy from over the counter): For smoking cessation. 28 patch 0     Patient Stressors:    Patient Strengths:    Treatment Modalities: Medication Management, Group therapy, Case management,  1 to 1 session with clinician, Psychoeducation, Recreational therapy.   Physician Treatment Plan for Primary Diagnosis: Bipolar 2 disorder, major depressive episode ([place]) Long Term Goal(s): Improvement in symptoms so as ready for discharge   Short Term Goals: Ability to identify changes in lifestyle to reduce recurrence of condition will improve Ability to disclose and discuss suicidal ideas Ability to demonstrate self-control will improve Ability to identify and develop effective coping behaviors will improve Ability to maintain clinical measurements within normal limits will improve Compliance with prescribed medications will improve Ability to identify triggers associated with substance abuse/mental health issues will improve  Medication Management:  Evaluate patient's response, side effects, and tolerance of medication regimen.  Therapeutic Interventions: 1 to 1 sessions, Unit Group sessions and Medication administration.  Evaluation of Outcomes: Not Met  Physician Treatment Plan for Secondary Diagnosis: Principal Problem:   Bipolar 2 disorder, major depressive episode ([place]) Active Problems:   Asthma   GAD (generalized anxiety disorder)   Insomnia  Long Term Goal(s): Improvement in symptoms so as ready for discharge   Short Term Goals: Ability to identify changes in lifestyle to reduce recurrence of  condition will improve Ability to disclose and discuss suicidal ideas Ability to demonstrate self-control will improve Ability to identify and develop effective coping behaviors will improve Ability to maintain clinical measurements within normal limits will improve Compliance with prescribed medications will improve Ability to identify triggers associated with substance abuse/mental health issues will improve     Medication Management: Evaluate patient's response, side effects, and tolerance of medication regimen.  Therapeutic Interventions: 1 to 1 sessions, Unit Group sessions and Medication administration.  Evaluation of Outcomes: Not Met   RN Treatment Plan for Primary Diagnosis: Bipolar 2 disorder, major depressive episode ([place]) Long Term Goal(s): Knowledge of disease and therapeutic regimen to maintain health will improve  Short Term Goals: Ability to remain free from injury will improve, Ability to participate in decision making will improve, Ability to verbalize feelings will improve, Ability to disclose and discuss suicidal ideas, and Ability to identify and develop effective coping behaviors will improve  Medication Management: RN will administer medications as ordered by provider, will assess and evaluate patient's response and provide education to patient for prescribed medication. RN will report any adverse and/or side effects to prescribing provider.  Therapeutic Interventions: 1 on 1 counseling sessions, Psychoeducation, Medication administration, Evaluate responses to treatment, Monitor vital signs and CBGs as ordered, Perform/monitor CIWA, COWS, AIMS and Fall Risk screenings as ordered, Perform wound care treatments as ordered.  Evaluation of Outcomes: Not Met   LCSW Treatment Plan for Primary Diagnosis: Bipolar 2 disorder, major depressive episode ([place]) Long Term Goal(s): Safe transition to appropriate next level of care at discharge, Engage patient in therapeutic group  addressing interpersonal concerns.  Short Term Goals: Engage patient in aftercare planning with referrals and resources, Increase social support, Increase emotional regulation, Facilitate acceptance of mental health diagnosis and concerns, Identify triggers associated with mental health/substance abuse issues, and Increase skills for wellness and recovery  Therapeutic Interventions: Assess for all discharge needs, 1 to 1 time with Social worker, Explore available resources and support systems, Assess for adequacy in community support network, Educate family and significant other(s) on suicide prevention, Complete Psychosocial Assessment, Interpersonal group therapy.  Evaluation of Outcomes: Not Met   Progress in Treatment: Attending groups: Yes. Participating in groups: Yes. Taking medication as prescribed: Yes. Toleration medication: Yes. Family/Significant other contact made: Yes, individual(s) contacted:  [facility]  Patient understands diagnosis: Yes. Discussing patient identified problems/goals with staff: Yes. Medical problems stabilized or resolved: Yes. Denies suicidal/homicidal ideation: Yes. Issues/concerns per patient self-inventory: No.   New problem(s) identified: No, Describe:  None   New Short Term/Long Term Goal(s): medication stabilization, elimination of SI thoughts, development of comprehensive mental wellness plan.   Patient Goals: "Focus on myself and my mental health by learning additional coping skills"   Discharge Plan or Barriers: Patient recently admitted. CSW will continue to follow and assess for appropriate referrals and possible discharge planning.   Reason for Continuation of Hospitalization: Anxiety Depression Medication stabilization Suicidal ideation  Estimated Length of  Stay: 3 to 7 days   Last 3 [place] Suicide Severity Risk Score: Flowsheet Row Admission (Current) from 10/18/2022 in [facility] ED from  10/16/2022 in [facility] Emergency Department at [facility] ED from 10/04/2022 in [place] No Risk High Risk No Risk       Last [facility] 2/9 Scores:    09/16/2022    4:15 PM 08/21/2022   12:43 AM 08/18/2022    3:46 PM  Depression screen PHQ 2/9  Decreased Interest '3 2 2  '$ Down, Depressed, Hopeless '3 2 2  '$ PHQ - 2 Score '6 4 4  '$ Altered sleeping '3 1 3  '$ Tired, decreased energy '3 1 2  '$ Change in appetite '3 1 3  '$ Feeling bad or failure about yourself  '3 1 3  '$ Trouble concentrating 3 1 0  Moving slowly or fidgety/restless 0 0 0  Suicidal thoughts 0 1 0  PHQ-9 Score '21 10 15  '$ Difficult doing work/chores  Very difficult     Scribe for Treatment Team: Darleen Crocker, Latanya Presser 10/20/2022 1:25 PM

## 2022-10-20 NOTE — Progress Notes (Signed)
   10/20/22 1642  Vital Signs  Pulse Rate 73  Pulse Rate Source Monitor  BP (!) 128/48  BP Method Automatic  Oxygen Therapy  SpO2 99 %   Patient's diastolic BP low. Patient has no complaints of dizziness or weakness. No s/s of distress noted at this time. Patient provided with Gatorade.

## 2022-10-20 NOTE — Progress Notes (Signed)
[facility] MD Progress Note  10/20/2022 2:52 PM Sharon Ward  MRN:  [id number] Subjective:   Principal Problem: Bipolar 2 disorder, major depressive episode ([place]) Diagnosis: Principal Problem:   Bipolar 2 disorder, major depressive episode ([place]) Active Problems:   Asthma   GAD (generalized anxiety disorder)   Insomnia  HPI: Sharon Ward is a 27 yo Caucasian female with a past psychiatric history of bipolar 2 disorder, borderline personality disorder, GAD, ODD, multiple suicide attempts, and multiple psychiatric admissions who presented to the [facility] ER on 10/16/22 via ambulance after intentionally overdosing on home medications: Hydroxyzine, trazodone, fluoxetine and Abilify in the context of multiple stressors. Pt was placed under involuntarily commitment and medically stabilized prior to being transferred to [facility] for further treatment and stabilization. Of note patient's last admission to this hospital was on 08/31/2022.  24 hour chart review: Patient vital signs remains stable. Patient currently taking Abilify 2 mg daily; plan to increase to 5 mg. No PRNs given. She has remained visible in milieu participating in unit groups and activities. Slept overnight without any issues.   Assessment: Patient observed in milieu interacting with peers and staff, participating in unit activities and groups. She is casually dressed. Alert and oriented. Calm and cooperative. Euthymic mood. Congruent affect. Thought content linear and logical. She reports 'feeling much better and embarrassed about what happened the other day. I really shouldn't have done that. It was really impulsive'. Provider and patient spoke at length about her goals and ACTT services in which she expressed plan to continue with therapy appointments, taking my medication 'like I should' and utilize ACTT services for additional support. She denies any bad dreams last night. She reports improved mood; denies any anxiety or depression today.  Smiles throughout assessment. Continues to identify various family members as supports and her children as her protective factors. She is denies any active SI/HI/AVH and contracts for safety. No delusional thought content or paranoia noted. She remains pleasant and futuristic thinking making plans for continued care post-discharge. Smiles throughout assessment. TSH was slightly elevated; t4 ordered, results pending. Patient denies any history of thyroid disease.   Recent Medication Trials:  Abilify, Zyprexa, Vraylar, Zoloft, Lexapro, Wellbutrin, and as per chart review, she has had trials of Latuda & Geodon. Medications discontinued on this admission: - Prozac 20 mg daily -patient states she overdosed on this medication, risk of NMS and serotonin syndrome -Abilify 10 mg daily HS due to patient's recent overdose on this medication-we will allow for washout period. -Norvasc 5 mg -hypotension has been persistent due to recent overdose on medication  Total Time spent with patient: 30 minutes  Past Psychiatric History: see H&P  Past Medical History:  Past Medical History:  Diagnosis Date   Asthma    Bipolar 1 disorder, mixed ([place])    Depression    Generalized anxiety disorder    Intentional drug overdose ([place]) 11/03/2020   Low-lying placenta 01/07/2022   Resolved 03/04/22   Relationship dysfunction    Suicide attempt ([place]) 11/02/2020    Past Surgical History:  Procedure Laterality Date   PILONIDAL CYST / SINUS EXCISION  09/11/2013   PILONIDAL CYST EXCISION  05/17/2014   Pilonidal cystectomy with cleft lip   Family History:  Family History  Problem Relation Age of Onset   Asthma Mother    Diabetes Mother    Healthy Mother    Hypertension Father    Asthma Father    Diabetes Father    Healthy Father    Asthma  Brother    Hypertension Paternal Uncle    Diabetes Paternal Grandmother    Stroke Paternal Grandfather    Heart disease Paternal Grandfather    Hypertension Paternal  Grandfather    Diabetes Paternal Grandfather    Family Psychiatric  History: see H&P Social History:  Social History   Substance and Sexual Activity  Alcohol Use Not Currently   Comment: occasional prior to pregnancy     Social History   Substance and Sexual Activity  Drug Use Not Currently   Frequency: 4.0 times per week   Types: Marijuana   Comment: No Delta 9 since February 2023    Social History   Socioeconomic History   Marital status: Single    Spouse name: Not on file   Number of children: 1   Years of education: 10   Highest education level: 10th grade  Occupational History   Not on file  Tobacco Use   Smoking status: Former    Packs/day: 1.00    Years: 15.00    Total pack years: 15.00    Types: Cigarettes    Quit date: 07/14/2021    Years since quitting: 1.2   Smokeless tobacco: Never   Tobacco comments:    only smoke a "couple" of cigarettes when stressed or anxious, socially with friends per [facility] chart  Vaping Use   Vaping Use: Former   Start date: 08/10/2003   Quit date: 05/04/2021  Substance and Sexual Activity   Alcohol use: Not Currently    Comment: occasional prior to pregnancy   Drug use: Not Currently    Frequency: 4.0 times per week    Types: Marijuana    Comment: No Delta 9 since February 2023   Sexual activity: Not Currently    Partners: Male    Birth control/protection: None  Other Topics Concern   Not on file  Social History Narrative   Not on file   Social Determinants of Health   Financial Resource Strain: Not on file  Food Insecurity: No Food Insecurity (10/18/2022)   Hunger Vital Sign    Worried About Running Out of Food in the Last Year: Never true    Ran Out of Food in the Last Year: Never true  Transportation Needs: No Transportation Needs (10/18/2022)   PRAPARE - Hydrologist (Medical): No    Lack of Transportation (Non-Medical): No  Physical Activity: Not on file  Stress: Not on file  Social  Connections: Not on file   Additional Social History:   Sleep: Good  Appetite:  Good  Current Medications: Current Facility-Administered Medications  Medication Dose Route Frequency Provider Last Rate Last Admin   acetaminophen (TYLENOL) tablet 650 mg  650 mg Oral Q6H PRN Nkwenti, Doris, NP       albuterol (VENTOLIN HFA) 108 (90 Base) MCG/ACT inhaler 1 puff  1 puff Inhalation Q6H PRN Nkwenti, Doris, NP       alum & mag hydroxide-simeth (MAALOX/MYLANTA) 200-200-20 MG/5ML suspension 30 mL  30 mL Oral Q4H PRN Motley-Mangrum, Jadeka A, PMHNP       ARIPiprazole (ABILIFY) tablet 5 mg  5 mg Oral QHS Leevy-Johnson, Sricharan [place] A, NP       hydrOXYzine (ATARAX) tablet 25 mg  25 mg Oral TID PRN Janine Limbo, MD   25 mg at 10/19/22 2035   LORazepam (ATIVAN) tablet 2 mg  2 mg Oral TID PRN Motley-Mangrum, Al Pimple, PMHNP       Or   LORazepam (ATIVAN)  injection 2 mg  2 mg Intramuscular TID PRN Motley-Mangrum, Jadeka A, PMHNP       magnesium hydroxide (MILK OF MAGNESIA) suspension 30 mL  30 mL Oral Daily PRN Motley-Mangrum, Jadeka A, PMHNP       nicotine polacrilex (NICORETTE) gum 2 mg  2 mg Oral PRN Nicholes Rough, NP   2 mg at 10/19/22 1640   traZODone (DESYREL) tablet 50 mg  50 mg Oral QHS PRN Motley-Mangrum, Al Pimple, PMHNP   50 mg at 10/19/22 2035    Lab Results:  Results for orders placed or performed during the hospital encounter of 10/18/22 (from the past 48 hour(s))  Hemoglobin A1c     Status: None   Collection Time: 10/19/22  6:30 AM  Result Value Ref Range   Hgb A1c MFr Bld 5.2 4.8 - 5.6 %    Comment: (NOTE)         Prediabetes: 5.7 - 6.4         Diabetes: >6.4         Glycemic control for adults with diabetes: <7.0    Mean Plasma Glucose 103 mg/dL    Comment: (NOTE) Performed At: [facility] [place] [facility] [place], [place] [id number] Rush Farmer MD [id number]   Lipid panel     Status: Abnormal   Collection Time: 10/19/22  6:30 AM  Result Value Ref Range    Cholesterol 185 0 - 200 mg/dL   Triglycerides 117 <150 mg/dL   HDL 61 >40 mg/dL   Total CHOL/HDL Ratio 3.0 RATIO   VLDL 23 0 - 40 mg/dL   LDL Cholesterol 101 (H) 0 - 99 mg/dL    Comment:        Total Cholesterol/HDL:CHD Risk Coronary Heart Disease Risk Table                     Men   Women  1/2 Average Risk   3.4   3.3  Average Risk       5.0   4.4  2 X Average Risk   9.6   7.1  3 X Average Risk  23.4   11.0        Use the calculated Patient Ratio above and the CHD Risk Table to determine the patient's CHD Risk.        ATP III CLASSIFICATION (LDL):  <100     mg/dL   Optimal  100-129  mg/dL   Near or Above                    Optimal  130-159  mg/dL   Borderline  160-189  mg/dL   High  >190     mg/dL   Very High Performed at [facility] [address], [place], [place] [address]   TSH     Status: Abnormal   Collection Time: 10/19/22  6:30 AM  Result Value Ref Range   TSH 5.030 (H) 0.350 - 4.500 uIU/mL    Comment: Performed by a 3rd Generation assay with a functional sensitivity of <=0.01 uIU/mL. Performed at [facility], [place] [address] [place], [place] [address]     Blood Alcohol level:  Lab Results  Component Value Date   [facility] <10 10/16/2022   ETH <10 [id number]    Metabolic Disorder Labs: Lab Results  Component Value Date   HGBA1C 5.2 10/19/2022   MPG 103 10/19/2022   MPG 97 10/04/2022   Lab Results  Component  Value Date   PROLACTIN 48.1 (H) 11/10/2021   Lab Results  Component Value Date   CHOL 185 10/19/2022   TRIG 117 10/19/2022   HDL 61 10/19/2022   CHOLHDL 3.0 10/19/2022   VLDL 23 10/19/2022   LDLCALC 101 (H) 10/19/2022   LDLCALC 129 (H) 07/13/2022    Physical Findings: AIMS:  , ,  ,  ,    CIWA:    COWS:     Musculoskeletal: Strength & Muscle Tone: within normal limits Gait & Station: normal Patient leans: N/A  Psychiatric Specialty Exam:  Presentation  General Appearance:  Casual  Eye  Contact: Good  Speech: Clear and Coherent  Speech Volume: Normal  Handedness: Right   Mood and Affect  Mood: Euthymic  Affect: Appropriate; Congruent   Thought Process  Thought Processes: Coherent  Descriptions of Associations:Intact  Orientation:Full (Time, Place and Person)  Thought Content:Logical  History of Schizophrenia/Schizoaffective disorder:No  Duration of Psychotic Symptoms:N/A  Hallucinations:Hallucinations: None  Ideas of Reference:None  Suicidal Thoughts:Suicidal Thoughts: No  Homicidal Thoughts:Homicidal Thoughts: No   Sensorium  Memory: Immediate Good; Recent Good  Judgment: Fair  Insight: Present  Executive Functions  Concentration: Fair  Attention Span: Fair  Recall: [employer] of Knowledge: Fair  Language: Fair  Psychomotor Activity  Psychomotor Activity: Psychomotor Activity: Normal  Assets  Assets: Communication Skills; Desire for Improvement; Housing; Physical Health; Resilience; Social Support  Sleep  Sleep: Sleep: Fair  Physical Exam: Physical Exam Vitals and nursing note reviewed.  Constitutional:      Appearance: She is obese. She is not ill-appearing or toxic-appearing.  HENT:     Head: Normocephalic.     Nose: Nose normal.     Mouth/Throat:     Mouth: Mucous membranes are moist.     Pharynx: Oropharynx is clear.  Eyes:     Pupils: Pupils are equal, round, and reactive to light.  Cardiovascular:     Rate and Rhythm: Normal rate.     Pulses: Normal pulses.  Pulmonary:     Effort: Pulmonary effort is normal.  Abdominal:     Palpations: Abdomen is soft.  Musculoskeletal:        General: Normal range of motion.  Skin:    General: Skin is dry.  Neurological:     Mental Status: She is oriented to person, place, and time.  Psychiatric:        Attention and Perception: She does not perceive auditory or visual hallucinations.        Mood and Affect: Mood and affect normal.        Speech:  Speech normal.        Behavior: Behavior is cooperative.        Thought Content: Thought content normal. Thought content is not paranoid or delusional. Thought content does not include homicidal or suicidal ideation. Thought content does not include homicidal or suicidal plan.        Cognition and Memory: Cognition and memory normal.        Judgment: Judgment normal.    Review of Systems  Psychiatric/Behavioral:  Positive for depression. Negative for hallucinations and suicidal ideas.    Blood pressure 106/75, pulse 93, temperature (!) 97.4 F (36.3 C), temperature source Oral, resp. rate 16, height '5\' 2"'$  (1.575 m), weight 96.2 kg, SpO2 96 %, not currently breastfeeding. Body mass index is 38.78 kg/m.  Treatment Plan Summary: Daily contact with patient to assess and evaluate symptoms and progress in treatment, Medication management, and Plan  PLAN Safety and Monitoring: Voluntary admission to inpatient psychiatric unit for safety, stabilization and treatment Daily contact with patient to assess and evaluate symptoms and progress in treatment Patient's case to be discussed in multi-disciplinary team meeting Observation Level : q15 minute checks Vital signs: q12 hours Precautions: Safety  Diagnoses:  Principal Problem:   Bipolar 2 disorder, major depressive episode ([place]) Active Problems:   Asthma   GAD (generalized anxiety disorder)   Insomnia  Labs: TSH 5.030; T4 ordered for 10/20/22  Medications  -Increase - Abilify 5 mg PO daily at bedtime     PRNs - Albuterol inhaler PRN for wheezing/SOB Q 6 hr - Nicorette gum PRN Q 2 hr - Tylenol 650 mg every 6 hours PRN for mild pain - Maalox 30 mg every 4 hrs PRN for indigestion - Milk of Magnesia as needed every 6 hrs for constipation -Trazodone 50 mg as needed nightly for sleep (hold if continues to be hypotensive) - Ativan 2 mg p.o. or IM 3 times daily as needed for agitation   Patient educated about all medication  rationales, benefits and possible side effects, verbalizes understanding.   Discharge Planning: Social work and case management to assist with discharge planning and identification of hospital follow-up needs prior to discharge Estimated LOS: 5-7 days Discharge Concerns: Need to establish a safety plan; Medication compliance and effectiveness Discharge Goals: Return home with outpatient referrals for mental health follow-up including medication management/psychotherapy   I certify that inpatient services furnished can reasonably be expected to improve the patient's condition.    Inda Merlin, NP 10/20/2022, 2:52 PMPatient ID: Sharon Ward, female   DOB: 04-22-1996, 27 y.o.   MRN: [id number]

## 2022-10-20 NOTE — Progress Notes (Addendum)
Pt denies SI/HI/AVH this morning. Pt reports that she wants to leave by Friday and has repeatedly asked this RN about when she will be discharging throughout the day. Pt reports that she broke her ankle in 2021 and is having some pain and tingling in her ankle associated with it today. RN assessed ankle, no complications observed. Pt provided with heating pack and did not report any more pain following heat application.Q15 min checks verified for safety. Patient compliant with medications and treatment plan. Patient is interacting well on the unit. Pt is safe on the unit.   10/20/22 0900  Psych Admission Type (Psych Patients Only)  Admission Status Voluntary/72 hour document signed  Date 72 hour document signed  10/19/22  Time 72 hour document signed  1831  Provider Notified (First and Last Name) (see details for LINK to note) Massengill, MD  Psychosocial Assessment  Patient Complaints None  Eye Contact Fair  Facial Expression Animated;Sad  Affect Appropriate to circumstance  Speech Logical/coherent  Interaction Needy;Attention-seeking  Motor Activity Slow  Appearance/Hygiene Unremarkable  Behavior Characteristics Cooperative;Appropriate to situation  Mood Depressed;Anxious  Thought Process  Coherency WDL  Content WDL  Delusions None reported or observed  Perception WDL  Hallucination None reported or observed  Judgment Impaired  Confusion None  Danger to Self  Current suicidal ideation? Denies  Description of Suicide Plan No plan  Self-Injurious Behavior No self-injurious ideation or behavior indicators observed or expressed   Agreement Not to Harm Self Yes  Description of Agreement Verbal  Danger to Others  Danger to Others None reported or observed  Danger to Others Abnormal  Harmful Behavior to others No threats or harm toward other people  Destructive Behavior No threats or harm toward property

## 2022-10-20 NOTE — Progress Notes (Signed)
   10/20/22 2200  Psych Admission Type (Psych Patients Only)  Admission Status Voluntary/72 hour document signed  Date 72 hour document signed  10/19/22  Time 72 hour document signed  1831  Provider Notified (First and Last Name) (see details for LINK to note) Massengill, MD  Psychosocial Assessment  Patient Complaints None  Eye Contact Fair  Facial Expression Animated  Affect Appropriate to circumstance  Speech Logical/coherent  Interaction Needy  Motor Activity Slow  Appearance/Hygiene Unremarkable  Behavior Characteristics Cooperative;Appropriate to situation  Mood Pleasant  Thought Process  Coherency WDL  Content WDL  Delusions None reported or observed  Perception WDL  Hallucination None reported or observed  Judgment Impaired  Confusion None  Danger to Self  Current suicidal ideation? Denies  Self-Injurious Behavior No self-injurious ideation or behavior indicators observed or expressed   Agreement Not to Harm Self Yes  Description of Agreement verbal  Danger to Others  Danger to Others None reported or observed  Danger to Others Abnormal  Harmful Behavior to others No threats or harm toward other people  Destructive Behavior No threats or harm toward property

## 2022-10-20 NOTE — Progress Notes (Signed)
   10/20/22 0615  15 Minute Checks  Location Bedroom  Visual Appearance Calm  Behavior Composed  Sleep (Behavioral Health Patients Only)  Calculate sleep? (Click Yes once per 24 hr at 0600 safety check) Yes  Documented sleep last 24 hours 5.75

## 2022-10-20 NOTE — Progress Notes (Unsigned)
  Subjective:    Sharon Ward - 27 y.o. female MRN [id number]  Date of birth: November 08, 1995  HPI  Sharon Ward is to establish care.   Current issues and/or concerns:  ROS per HPI     Health Maintenance:  Health Maintenance Due  Topic Date Due   COVID-19 Vaccine (1) Never done     Past Medical History: Patient Active Problem List   Diagnosis Date Noted   Insomnia 10/18/2022   Bipolar 1 disorder, depressed (HCC) 08/30/2022   MDD (major depressive disorder) 07/30/2022   Suicidal ideation 07/29/2022   UTI (urinary tract infection) 07/15/2022   GBS bacteriuria 11/09/2021   Bipolar disorder, rapid cycling (HCC) 11/09/2021   GAD (generalized anxiety disorder) 09/24/2021   Marijuana abuse 05/05/2021   Bipolar 2 disorder, major depressive episode (HCC) 02/05/2021   Overdose 11/03/2020   Prolonged QT interval 11/02/2020   Asthma 09/18/2019   LGSIL on Pap smear of cervix 09/18/2019   Oppositional defiant disorder 09/18/2019   History of dysphagia 02/01/2018   Chronic constipation 04/25/2017   Borderline personality disorder (HCC) 10/24/2016   PTSD (post-traumatic stress disorder) 10/24/2016   Gastroesophageal reflux disease without esophagitis 10/24/2016   Mild intermittent asthma without complication 10/24/2016      Social History   reports that she quit smoking about 15 months ago. Her smoking use included cigarettes. She has a 15.00 pack-year smoking history. She has never used smokeless tobacco. She reports that she does not currently use alcohol. She reports that she does not currently use drugs after having used the following drugs: Marijuana. Frequency: 4.00 times per week.   Family History  family history includes Asthma in her brother, father, and mother; Diabetes in her father, mother, paternal grandfather, and paternal grandmother; Healthy in her father and mother; Heart disease in her paternal grandfather; Hypertension in her father, paternal grandfather, and  paternal uncle; Stroke in her paternal grandfather.   Medications: reviewed and updated   Objective:   Physical Exam There were no vitals taken for this visit. Physical Exam      Assessment & Plan:         Patient was given clear instructions to go to Emergency Department or return to medical center if symptoms don't improve, worsen, or new problems develop.The patient verbalized understanding.  I discussed the assessment and treatment plan with the patient. The patient was provided an opportunity to ask questions and all were answered. The patient agreed with the plan and demonstrated an understanding of the instructions.   The patient was advised to call back or seek an in-person evaluation if the symptoms worsen or if the condition fails to improve as anticipated.    Ricky Stabs, NP 10/20/2022, 8:16 AM Primary Care at [facility]

## 2022-10-20 NOTE — [facility] Group Notes (Signed)
[facility] Group Notes:  (Nursing/MHT/Case Management/Adjunct)  Date:  10/20/2022  Time:  9:19 AM  Type of Therapy:  Group Topic/ Focus: Goals Group: The focus of this group is to help patients establish daily goals to achieve during treatment and discuss how the patient can incorporate goal setting into their daily lives to aide in recovery.   Participation Level:  Active  Participation Quality:  Appropriate  Affect:  Appropriate  Cognitive:  Appropriate  Insight:  Appropriate  Engagement in Group:  Engaged  Modes of Intervention:  Discussion  Summary of Progress/Problems:  Patient attended and participated in orientation/ goals group today. Patient's goal for today is to start working on discharge and not letting people get to her.   Elza Rafter 10/20/2022, 9:19 AM

## 2022-10-21 ENCOUNTER — Encounter: Payer: Medicaid Other | Admitting: Family

## 2022-10-21 DIAGNOSIS — Z7689 Persons encountering health services in other specified circumstances: Secondary | ICD-10-CM

## 2022-10-21 MED ORDER — NICOTINE POLACRILEX 2 MG MT GUM: 2.0000 mg | CHEWING_GUM | OROMUCOSAL | 0 refills | Status: DC | PRN

## 2022-10-21 MED ORDER — NICOTINE 14 MG/24HR TD PT24: 14.0000 mg | MEDICATED_PATCH | Freq: Every day | TRANSDERMAL | 0 refills | Status: DC

## 2022-10-21 MED ORDER — ARIPIPRAZOLE 5 MG PO TABS: 5.0000 mg | ORAL_TABLET | Freq: Every day | ORAL | 0 refills | Status: DC

## 2022-10-21 MED ORDER — TRAZODONE HCL 50 MG PO TABS: 50.0000 mg | ORAL_TABLET | Freq: Every evening | ORAL | 0 refills | Status: DC | PRN

## 2022-10-21 MED ORDER — ALBUTEROL SULFATE HFA 108 (90 BASE) MCG/ACT IN AERS: 1.0000 | INHALATION_SPRAY | Freq: Four times a day (QID) | RESPIRATORY_TRACT | Status: DC | PRN

## 2022-10-21 MED ORDER — HYDROXYZINE HCL 25 MG PO TABS: 25.0000 mg | ORAL_TABLET | Freq: Three times a day (TID) | ORAL | 0 refills | Status: DC | PRN

## 2022-10-21 NOTE — Progress Notes (Signed)
Patient ID: Sharon Ward, female   DOB: [birth year], 27 y.o.   MRN: [id number]   Pt ambulatory, alert, and oriented X4 on and off the unit. Education, support, and encouragement provided. Discharge summary/AVS, prescriptions, medications, and follow up appointments reviewed with pt and a copy of the AVS was given to pt. Medications "next dose" was also reviewed with pt and marked/notated on pt's med list on AVS. Suicide safety plan completed, reviewed with this RN, given to the patient, and a copy was placed in the chart. Suicide prevention resources also provided. Pt's belongings in locker #35 returned and belongings sheet signed. Pt denies SI/HI (plan and intention), and AVH. Pt denies any concerns at this time. Pt discharged to [facility] in lobby to be transported to her destination

## 2022-10-21 NOTE — Discharge Instructions (Signed)
-  Follow-up with your outpatient psychiatric provider -instructions on appointment date, time, and address (location) are provided to you in discharge paperwork.  -you will be given Rx for 7 days, printed   -Take your psychiatric medications as prescribed at discharge - instructions are provided to you in the discharge paperwork  -Follow-up with outpatient primary care doctor and other specialists -for management of preventative medicine and any chronic medical disease.  -Recommend abstinence from alcohol, tobacco, and other illicit drug use at discharge.   -If your psychiatric symptoms recur, worsen, or if you have side effects to your psychiatric medications, call your outpatient psychiatric provider, 911, [phone] or go to the nearest emergency department.  -If suicidal thoughts occur, call your outpatient psychiatric provider, 911, [phone] or go to the nearest emergency department.  Naloxone (Narcan) can help reverse an overdose when given to the victim quickly.  [facility] offers free naloxone kits and instructions/training on its use.  Add naloxone to your first aid kit and you can help save a life.   Pick up your free kit at the following locations:   [place]:  [address], [place] [address] [phone]) [facility] [place] [id number] [phone]) [facility] [place]  [place]: [place] [address] [place] [address] [phone]) [facility] [place] [address] [phone])

## 2022-10-21 NOTE — Progress Notes (Signed)
   10/21/22 0640  15 Minute Checks  Location Hallway  Visual Appearance Calm  Behavior Composed  Sleep (Behavioral Health Patients Only)  Calculate sleep? (Click Yes once per 24 hr at 0600 safety check) Yes  Documented sleep last 24 hours 7.75

## 2022-10-21 NOTE — Discharge Summary (Signed)
Physician Discharge Summary Note  Patient:  Sharon Ward is an 27 y.o., female MRN:  [id number] DOB:  June 15, 1996 Patient phone:  [phone] (home)  Patient address:   [address] [place] [address],  Total Time spent with patient: 15 minutes  Date of Admission:  10/18/2022 Date of Discharge: 10-21-2022  Reason for Admission:    Sharon Ward is a 27 yo Caucasian female with a past psychiatric history of bipolar 2 disorder, borderline personality disorder, GAD, ODD, multiple suicide attempts, and multiple psychiatric admissions who presented to the [facility] ER on 10/16/22 via ambulance after intentionally overdosing on home medications: Hydroxyzine, trazodone, fluoxetine and Abilify in the context of multiple stressors. Pt was placed under involuntarily commitment and medically stabilized prior to being transferred to [facility] for further treatment and stabilization. Of note patient's last admission to this hospital was on 08/31/2022.     Principal Problem: Bipolar 2 disorder, major depressive episode [facility]) Discharge Diagnoses: Principal Problem:   Bipolar 2 disorder, major depressive episode ([place]) Active Problems:   Asthma   GAD (generalized anxiety disorder)   Insomnia   Past Psychiatric History:  Previous Psych Diagnoses: ODD, bipolar 2 disorder, GAD, borderline personality.   Prior inpatient treatment: Starting at age 89 years old due to anger outburst.Reports fist hospitalization to be at [facility]. Pt reports multiple mental health hospitalizations around [place] at various hospitalizations. [facility] admissions are: 10/01/2022, 07/12/2022,11/09/2021, 05/04/2021, 04/02/2021, 01/12/2021, 10/25/2020. Pt also has multiple ER visits at several hospitals [place] related to suicidal ideations.    Current/prior outpatient treatment:Has an Assertive community treatment team (ACTT).  Unable to recall name, states that she wants another  psychiatrist.   Prior rehab hx: Denies Psychotherapy hx: Prior to counseling Associates, [facility]".   History of suicide attempts: Reports a history of multiple suicidal attempts via overdosing on medications. States that her ACTT only provides 7 days supply of medications at a time due to history of multiple overdoses.    History of homicide or aggression: anger outbursts in childhood and adolescence. Denies homicides.    Psychiatric medication history: States that she has been on most medications including Abilify, Zyprexa, Vraylar, Zoloft, Lexapro, Wellbutrin, and as per chart review, she has had trials of [facility].    Psychiatric medication compliance history: Reports compliance Neuromodulation history: Denies Current Psychiatrist: ACT team Current therapist: See above    Past Medical History:  Past Medical History:  Diagnosis Date   Asthma    Bipolar 1 disorder, mixed ([place])    Depression    Generalized anxiety disorder    Intentional drug overdose ([place]) 11/03/2020   Low-lying placenta 01/07/2022   Resolved 03/04/22   Relationship dysfunction    Suicide attempt ([place]) 11/02/2020    Past Surgical History:  Procedure Laterality Date   PILONIDAL CYST / SINUS EXCISION  09/11/2013   PILONIDAL CYST EXCISION  05/17/2014   Pilonidal cystectomy with cleft lip   Family History:  Family History  Problem Relation Age of Onset   Asthma Mother    Diabetes Mother    Healthy Mother    Hypertension Father    Asthma Father    Diabetes Father    Healthy Father    Asthma Brother    Hypertension Paternal Uncle    Diabetes Paternal Grandmother    Stroke Paternal Grandfather    Heart disease Paternal Grandfather    Hypertension Paternal Grandfather    Diabetes Paternal Merchant navy officer  Family Psychiatric  History: Medical:  Psych:Paternal grandfather with MDD & GAD. Father with Schizophrenia, MDD & GAD. Brother with MDD, "anger issues", and GAD. Paternal cousin with MDD &  GAD. Paternal uncle with PTSD & MDD, Paternal aunt with MDD & Bipolar d/o.  Paternal great grandfather completed suicide by shooting himself with a gun.   Psych Rx: Denies SA/HA:Denies Substance use family hx: Denies    Social History:  Social History   Substance and Sexual Activity  Alcohol Use Not Currently   Comment: occasional prior to pregnancy     Social History   Substance and Sexual Activity  Drug Use Not Currently   Frequency: 4.0 times per week   Types: Marijuana   Comment: No Delta 9 since February 2023    Social History   Socioeconomic History   Marital status: Single    Spouse name: Not on file   Number of children: 1   Years of education: 10   Highest education level: 10th grade  Occupational History   Not on file  Tobacco Use   Smoking status: Former    Packs/day: 1.00    Years: 15.00    Additional pack years: 0.00    Total pack years: 15.00    Types: Cigarettes    Quit date: 07/14/2021    Years since quitting: 1.2   Smokeless tobacco: Never   Tobacco comments:    only smoke a "couple" of cigarettes when stressed or anxious, socially with friends per [facility] chart  Vaping Use   Vaping Use: Former   Start date: 08/10/2003   Quit date: 05/04/2021  Substance and Sexual Activity   Alcohol use: Not Currently    Comment: occasional prior to pregnancy   Drug use: Not Currently    Frequency: 4.0 times per week    Types: Marijuana    Comment: No Delta 9 since February 2023   Sexual activity: Not Currently    Partners: Male    Birth control/protection: None  Other Topics Concern   Not on file  Social History Narrative   Not on file   Social Determinants of Health   Financial Resource Strain: Not on file  Food Insecurity: No Food Insecurity (10/18/2022)   Hunger Vital Sign    Worried About Running Out of Food in the Last Year: Never true    [place] in the Last Year: Never true  Transportation Needs: No Transportation Needs (10/18/2022)    PRAPARE - Hydrologist (Medical): No    Lack of Transportation (Non-Medical): No  Physical Activity: Not on file  Stress: Not on file  Social Connections: Not on file    Hospital Course:     During the patient's hospitalization, patient had extensive initial psychiatric evaluation, and follow-up psychiatric evaluations every day.  Psychiatric diagnoses provided upon initial assessment:  Bipolar 2 disorder, major depressive episode ([place])   Asthma   GAD (generalized anxiety disorder)   Insomnia  Patient's psychiatric medications were adjusted on admission:  -Discontinue Prozac-patient states she overdosed on this medication, risk of NMS and serotonin syndrome -Discontinue Abilify due to patient's recent overdose on this medication-we will allow for washout period. -Discontinue Norvasc-hypotension has been persistent due to recent overdose on medication  During the hospitalization, other adjustments were made to the patient's psychiatric medication regimen:  -restarted abilify and titrated to 50 mg once daily - for bipolar disorder  Patient's care was discussed during the interdisciplinary team meeting every day  during the hospitalization.  The patient denied having side effects to prescribed psychiatric medication.  Gradually, patient started adjusting to milieu. The patient was evaluated each day by a clinical provider to ascertain response to treatment. Improvement was noted by the patient's report of decreasing symptoms, improved sleep and appetite, affect, medication tolerance, behavior, and participation in unit programming.  Patient was asked each day to complete a self inventory noting mood, mental status, pain, new symptoms, anxiety and concerns.    Symptoms were reported as significantly decreased or resolved completely by discharge.   On day of discharge, the patient reports that their mood is stable. The patient denied having suicidal thoughts  for more than 48 hours prior to discharge.  Patient denies having homicidal thoughts.  Patient denies having auditory hallucinations.  Patient denies any visual hallucinations or other symptoms of psychosis. The patient was motivated to continue taking medication with a goal of continued improvement in mental health.   The patient reports their target psychiatric symptoms of depression and suicidal thoughts, all responded well to the psychiatric medications, and the patient reports overall benefit other psychiatric hospitalization. Supportive psychotherapy was provided to the patient. The patient also participated in regular group therapy while hospitalized. Coping skills, problem solving as well as relaxation therapies were also part of the unit programming.  Labs were reviewed with the patient, and abnormal results were discussed with the patient.  The patient is able to verbalize their individual safety plan to this provider.  # It is recommended to the patient to continue psychiatric medications as prescribed, after discharge from the hospital.    # It is recommended to the patient to follow up with your outpatient psychiatric provider and PCP.  # It was discussed with the patient, the impact of alcohol, drugs, tobacco have been there overall psychiatric and medical wellbeing, and total abstinence from substance use was recommended the patient.ed.  # Prescriptions provided or sent directly to preferred pharmacy at discharge. Patient agreeable to plan. Given opportunity to ask questions. Appears to feel comfortable with discharge.    # In the event of worsening symptoms, the patient is instructed to call the crisis hotline, 911 and or go to the nearest ED for appropriate evaluation and treatment of symptoms. To follow-up with primary care provider for other medical issues, concerns and or health care needs  # Patient was discharged home, to care of ACT team with a plan to follow up as noted  below.   Physical Findings: AIMS:  , ,  ,  ,    CIWA:    COWS:     Aims score zero on my exam. No eps on my exam.   Musculoskeletal: Strength & Muscle Tone: within normal limits Gait & Station: normal Patient leans: N/A   Psychiatric Specialty Exam:  Presentation  General Appearance:  Appropriate for Environment; Casual; Fairly Groomed  Eye Contact: Good  Speech: Normal Rate; Clear and Coherent  Speech Volume: Normal  Handedness: Right   Mood and Affect  Mood: Euthymic  Affect: Appropriate; Congruent; Full Range   Thought Process  Thought Processes: Linear  Descriptions of Associations:Intact  Orientation:Full (Time, Place and Person)  Thought Content:Logical  History of Schizophrenia/Schizoaffective disorder:No  Duration of Psychotic Symptoms:N/A  Hallucinations:Hallucinations: None  Ideas of Reference:None  Suicidal Thoughts:Suicidal Thoughts: No  Homicidal Thoughts:Homicidal Thoughts: No   Sensorium  Memory: Immediate Good; Recent Good; Remote Good  Judgment: Fair  Insight: Fair   Community education officer  Concentration: Fair  Attention Span:  Fair  Recall: Roel Cluck of Knowledge: Good  Language: Good   Psychomotor Activity  Psychomotor Activity: Psychomotor Activity: Normal   Assets  Assets: Communication Skills; Desire for Improvement; Housing; Physical Health; Resilience; Social Support   Sleep  Sleep: Sleep: Fair    Physical Exam: Physical Exam Vitals reviewed.  Pulmonary:     Effort: Pulmonary effort is normal.  Neurological:     Mental Status: She is alert.     Motor: No weakness.  Psychiatric:        Mood and Affect: Mood normal.        Behavior: Behavior normal.        Thought Content: Thought content normal.        Judgment: Judgment normal.    Review of Systems  Constitutional:  Negative for chills and fever.  Cardiovascular:  Negative for chest pain and palpitations.  Neurological:   Negative for dizziness, tingling, tremors and headaches.  Psychiatric/Behavioral:  Negative for depression, hallucinations, memory loss, substance abuse and suicidal ideas. The patient is not nervous/anxious and does not have insomnia.   All other systems reviewed and are negative.  Blood pressure 100/70, pulse 93, temperature 97.9 F (36.6 C), temperature source Oral, resp. rate 16, height '5\' 2"'$  (1.575 m), weight 96.2 kg, SpO2 99 %, not currently breastfeeding. Body mass index is 38.78 kg/m.   Social History   Tobacco Use  Smoking Status Former   Packs/day: 1.00   Years: 15.00   Additional pack years: 0.00   Total pack years: 15.00   Types: Cigarettes   Quit date: 07/14/2021   Years since quitting: 1.2  Smokeless Tobacco Never  Tobacco Comments   only smoke a "couple" of cigarettes when stressed or anxious, socially with friends per [facility] chart   Tobacco Cessation:  A prescription for an FDA-approved tobacco cessation medication provided at discharge   Blood Alcohol level:  Lab Results  Component Value Date   ETH <10 10/16/2022   ETH <10 [id number]    Metabolic Disorder Labs:  Lab Results  Component Value Date   HGBA1C 5.2 10/19/2022   MPG 103 10/19/2022   MPG 97 10/04/2022   Lab Results  Component Value Date   PROLACTIN 48.1 (H) 11/10/2021   Lab Results  Component Value Date   CHOL 185 10/19/2022   TRIG 117 10/19/2022   HDL 61 10/19/2022   CHOLHDL 3.0 10/19/2022   VLDL 23 10/19/2022   LDLCALC 101 (H) 10/19/2022   LDLCALC 129 (H) 07/13/2022    See Psychiatric Specialty Exam and Suicide Risk Assessment completed by Attending Physician prior to discharge.  Discharge destination:  Home  Is patient on multiple antipsychotic therapies at discharge:  No   Has Patient had three or more failed trials of antipsychotic monotherapy by history:  No  Recommended Plan for Multiple Antipsychotic Therapies: NA  Discharge Instructions     Diet - low sodium heart  healthy   Complete by: As directed    Increase activity slowly   Complete by: As directed       Allergies as of 10/21/2022       Reactions   Ascorbate Rash   Citrus Rash   Coconut Flavor Rash   Lamotrigine Rash   Orange (diagnostic) Rash   Peach Flavor Rash   Pear Rash   Pineapple Rash        Medication List     STOP taking these medications    amLODipine 5 MG tablet Commonly known as:  NORVASC   FLUoxetine 20 MG capsule Commonly known as: PROZAC   Melatonin 10 MG Tabs       TAKE these medications      Indication  albuterol 108 (90 Base) MCG/ACT inhaler Commonly known as: VENTOLIN HFA Inhale 1 puff into the lungs every 6 (six) hours as needed for wheezing or shortness of breath.  Indication: Asthma   ARIPiprazole 5 MG tablet Commonly known as: ABILIFY Take 1 tablet (5 mg total) by mouth at bedtime for 7 days. What changed:  medication strength how much to take additional instructions  Indication: MIXED BIPOLAR AFFECTIVE DISORDER   hydrOXYzine 25 MG tablet Commonly known as: ATARAX Take 1 tablet (25 mg total) by mouth 3 (three) times daily as needed for up to 7 days for anxiety.  Indication: Feeling Anxious   nicotine 14 mg/24hr patch Commonly known as: NICODERM CQ - dosed in mg/24 hours Place 1 patch (14 mg total) onto the skin daily. (May buy from over the counter): For smoking cessation.  Indication: Nicotine Addiction   nicotine polacrilex 2 MG gum Commonly known as: NICORETTE Take 1 each (2 mg total) by mouth as needed for smoking cessation.  Indication: Nicotine Addiction   traZODone 50 MG tablet Commonly known as: DESYREL Take 1 tablet (50 mg total) by mouth at bedtime as needed for up to 7 days for sleep.  Indication: [place]. Go on 11/18/2022.   Specialty: Behavioral Health Why: You have an appointment for therapy services on 11/18/22 at 7:45 am with  Vivette.  This appointment will be held in person.  This is the soonest available appointment with your provider, but you may call to change the appointment. Contact information: [address] [place] [address] [phone]         [facility] Follow [facility].   Specialty: Professional Counselor Why: A referral has been made on your behalf to this provider for the DBT therapy services program. Contact information: [place] [address] [phone]         [place]. Go on 11/12/2022.   Why: You have an appointment for medication management services on 11/12/22 at 11:00 am.  This appointment will be held in person. Contact information: [place] [address] [phone]                 Follow-up recommendations:    Activity: as tolerated  Diet: heart healthy  Other: -Follow-up with your outpatient psychiatric provider -instructions on appointment date, time, and address (location) are provided to you in discharge paperwork.  -Take your psychiatric medications as prescribed at discharge - instructions are provided to you in the discharge paperwork  -Follow-up with outpatient primary care doctor and other specialists -for management of preventative medicine and chronic medical disease and foot pain.  You stated that you had an appointment with the foot doctor at 1 pm today, it is important to go to this appt.   -Recommend abstinence from alcohol, tobacco, and other illicit drug use at discharge.   -If your psychiatric symptoms recur, worsen, or if you have side effects to your psychiatric medications, call your outpatient psychiatric provider, 911, [phone] or go to the nearest emergency department.  -If suicidal thoughts recur, call your outpatient psychiatric provider, 911, [phone] or go to the nearest emergency department.   Signed: Ovid Curd  Junita Push, MD 10/21/2022, 10:08 AM  Total Time  Spent in Direct Patient Care:  I personally spent 35 minutes on the unit in direct patient care. The direct patient care time included face-to-face time with the patient, reviewing the patient's chart, communicating with other professionals, and coordinating care. Greater than 50% of this time was spent in counseling or coordinating care with the patient regarding goals of hospitalization, psycho-education, and discharge planning needs.   Janine Limbo, MD Psychiatrist

## 2022-10-21 NOTE — Progress Notes (Signed)
  [facility] Adult Case Management Discharge Plan :  Will you be returning to the same living situation after discharge:  Yes,  Home At discharge, do you have transportation home?: Yes,  Taxi  Do you have the ability to pay for your medications: Yes,  Medicaid   Release of information consent forms completed and in the chart;  Patient's signature needed at discharge.  Patient to Follow up at:  Follow-up [place]. Go on 11/18/2022.   Specialty: Behavioral Health Why: You have an appointment for therapy services on 11/18/22 at 7:45 am with Vivette.  This appointment will be held in person.  This is the soonest available appointment with your provider, but you may call to change the appointment. Contact information: [address] [place] [address] [phone]         [facility].   Specialty: Professional Counselor Why: A referral has been made on your behalf to this provider for the DBT therapy services program. Contact information: [name] [address] [phone]         [place]. Go on 11/12/2022.   Why: You have an appointment for medication management services on 11/12/22 at 11:00 am.  This appointment will be held in person. Contact information: [place] [address] [phone]                 Next level of care provider has access to [place] and Suicide Prevention discussed: Yes,  With patient and [facility]      Has patient been referred to the Quitline?: Patient refused referral  Patient has been referred for addiction treatment: Pt. refused referral  Darleen Crocker, [place] 10/21/2022, 10:20 AM

## 2022-10-21 NOTE — Group Note (Signed)
Date:  10/21/2022 Time:  10:14 AM  Group Topic/Focus:  Orientation:   The focus of this group is to educate the patient on the purpose and policies of crisis stabilization and provide a format to answer questions about their admission.  The group details unit policies and expectations of patients while admitted.    Participation Level:  Active  Participation Quality:  Appropriate  Affect:  Appropriate  Cognitive:  Appropriate  Insight: Appropriate  Engagement in Group:  Engaged  Modes of Intervention:  Discussion  Additional Comments:     Jerrye Beavers 10/21/2022, 10:14 AM

## 2022-10-21 NOTE — [facility] Suicide Risk Assessment (Signed)
[facility] Discharge Suicide Risk Assessment   Principal Problem: Bipolar 2 disorder, major depressive episode ([place]) Discharge Diagnoses: Principal Problem:   Bipolar 2 disorder, major depressive episode ([place]) Active Problems:   Asthma   GAD (generalized anxiety disorder)   Insomnia   Total Time spent with patient: 15 minutes  Sharon Ward is a 27 yo Caucasian female with a past psychiatric history of bipolar 2 disorder, borderline personality disorder, GAD, ODD, multiple suicide attempts, and multiple psychiatric admissions who presented to the [facility] ER on 10/16/22 via ambulance after intentionally overdosing on home medications: Hydroxyzine, trazodone, fluoxetine and Abilify in the context of multiple stressors. Pt was placed under involuntarily commitment and medically stabilized prior to being transferred to [facility] for further treatment and stabilization. Of note patient's last admission to this hospital was on 08/31/2022.    During the patient's hospitalization, patient had extensive initial psychiatric evaluation, and follow-up psychiatric evaluations every day.   Psychiatric diagnoses provided upon initial assessment:  Bipolar 2 disorder, major depressive episode ([place])   Asthma   GAD (generalized anxiety disorder)   Insomnia   Patient's psychiatric medications were adjusted on admission:  -Discontinue Prozac-patient states she overdosed on this medication, risk of NMS and serotonin syndrome -Discontinue Abilify due to patient's recent overdose on this medication-we will allow for washout period. -Discontinue Norvasc-hypotension has been persistent due to recent overdose on medication   During the hospitalization, other adjustments were made to the patient's psychiatric medication regimen:  -restarted abilify and titrated to 50 mg once daily - for bipolar disorder   Patient's care was discussed during the interdisciplinary team meeting every day during the hospitalization.   The  patient denied having side effects to prescribed psychiatric medication.   Gradually, patient started adjusting to milieu. The patient was evaluated each day by a clinical provider to ascertain response to treatment. Improvement was noted by the patient's report of decreasing symptoms, improved sleep and appetite, affect, medication tolerance, behavior, and participation in unit programming.  Patient was asked each day to complete a self inventory noting mood, mental status, pain, new symptoms, anxiety and concerns.     Symptoms were reported as significantly decreased or resolved completely by discharge.    On day of discharge, the patient reports that their mood is stable. The patient denied having suicidal thoughts for more than 48 hours prior to discharge.  Patient denies having homicidal thoughts.  Patient denies having auditory hallucinations.  Patient denies any visual hallucinations or other symptoms of psychosis. The patient was motivated to continue taking medication with a goal of continued improvement in mental health.    The patient reports their target psychiatric symptoms of depression and suicidal thoughts, all responded well to the psychiatric medications, and the patient reports overall benefit other psychiatric hospitalization. Supportive psychotherapy was provided to the patient. The patient also participated in regular group therapy while hospitalized. Coping skills, problem solving as well as relaxation therapies were also part of the unit programming.   Labs were reviewed with the patient, and abnormal results were discussed with the patient.   The patient is able to verbalize their individual safety plan to this provider.   # It is recommended to the patient to continue psychiatric medications as prescribed, after discharge from the hospital.     # It is recommended to the patient to follow up with your outpatient psychiatric provider and PCP.   # It was discussed with the  patient, the impact of alcohol, drugs, tobacco have  been there overall psychiatric and medical wellbeing, and total abstinence from substance use was recommended the patient.ed.   # Prescriptions provided or sent directly to preferred pharmacy at discharge. Patient agreeable to plan. Given opportunity to ask questions. Appears to feel comfortable with discharge.    # In the event of worsening symptoms, the patient is instructed to call the crisis hotline, 911 and or go to the nearest ED for appropriate evaluation and treatment of symptoms. To follow-up with primary care provider for other medical issues, concerns and or health care needs   # Patient was discharged home, to care of ACT team with a plan to follow up as noted below.     Psychiatric Specialty Exam  Presentation  General Appearance:  Appropriate for Environment; Casual; Fairly Groomed  Eye Contact: Good  Speech: Normal Rate; Clear and Coherent  Speech Volume: Normal  Handedness: Right   Mood and Affect  Mood: Euthymic  Duration of Depression Symptoms: Greater than two weeks  Affect: Appropriate; Congruent; Full Range   Thought Process  Thought Processes: Linear  Descriptions of Associations:Intact  Orientation:Full (Time, Place and Person)  Thought Content:Logical  History of Schizophrenia/Schizoaffective disorder:No  Duration of Psychotic Symptoms:N/A  Hallucinations:Hallucinations: None  Ideas of Reference:None  Suicidal Thoughts:Suicidal Thoughts: No  Homicidal Thoughts:Homicidal Thoughts: No   Sensorium  Memory: Immediate Good; Recent Good; Remote Good  Judgment: Fair  Insight: Fair   Community education officer  Concentration: Fair  Attention Span: Fair  Recall: Good  Fund of Knowledge: Good  Language: Good   Psychomotor Activity  Psychomotor Activity: Psychomotor Activity: Normal   Assets  Assets: Communication Skills; Desire for Improvement; Housing; Physical  Health; Resilience; Social Support   Sleep  Sleep: Sleep: Fair   Physical Exam: Physical Exam See discharge summary  ROS See discharge summary  Blood pressure 100/70, pulse 93, temperature 97.9 F (36.6 C), temperature source Oral, resp. rate 16, height '5\' 2"'$  (1.575 m), weight 96.2 kg, SpO2 99 %, not currently breastfeeding. Body mass index is 38.78 kg/m.  Mental Status Per Nursing Assessment::   On Admission:  Suicidal ideation indicated by others  Demographic factors:  Caucasian, Adolescent or young adult, Living alone Loss Factors:  Loss of significant relationship Historical Factors:  Impulsivity (Kids in DSS custody) Risk Reduction Factors:  Positive social support    Continued Clinical Symptoms:  Bipolar d/o - mood is stable. Denying any SI, HI.  Borderline PD.   Cognitive Features That Contribute To Risk:  None    Suicide Risk:  Mild:  There are no identifiable suicide plans, no associated intent, mild dysphoria and related symptoms, good self-control (both objective and subjective assessment), few other risk factors, and identifiable protective factors, including available and accessible social support.    Follow-up [place]. Go on 11/18/2022.   Specialty: Behavioral Health Why: You have an appointment for therapy services on 11/18/22 at 7:45 am with Viv[facility].  This appointment will be held in person.  This is the soonest available appointment with your provider, but you may call to change the appointment. Contact information: [address] [place] [address] [phone]         [facility].   Specialty: Professional Counselor Why: A referral has been made on your behalf to this provider for the DBT therapy services program. Contact information: [place] [address] [phone]         [facility] Go on 11/12/2022.   Why: You have an  appointment for medication management services on 11/12/22 at 11:00 am.  This appointment will be held in person. Contact information: [place] [address] [phone]                 Plan Of Care/Follow-up recommendations:    Activity: as tolerated   Diet: heart healthy   Other: -Follow-up with your outpatient psychiatric provider -instructions on appointment date, time, and address (location) are provided to you in discharge paperwork.   -Take your psychiatric medications as prescribed at discharge - instructions are provided to you in the discharge paperwork   -Follow-up with outpatient primary care doctor and other specialists -for management of preventative medicine and chronic medical disease and foot pain.  You stated that you had an appointment with the foot doctor at 1 pm today, it is important to go to this appt.    -Recommend abstinence from alcohol, tobacco, and other illicit drug use at discharge.    -If your psychiatric symptoms recur, worsen, or if you have side effects to your psychiatric medications, call your outpatient psychiatric provider, 911, [phone] or go to the nearest emergency department.   -If suicidal thoughts recur, call your outpatient psychiatric provider, 911, [phone] or go to the nearest emergency department.    Christoper Allegra, MD 10/21/2022, 10:13 AM

## 2022-10-22 LAB — T4: T4, Total: 9.1 ug/dL (ref 4.5–12.0)

## 2022-10-26 ENCOUNTER — Ambulatory Visit (INDEPENDENT_AMBULATORY_CARE_PROVIDER_SITE_OTHER): Payer: Medicaid Other | Admitting: Psychiatry

## 2022-10-26 VITALS — BP 110/64 | HR 62

## 2022-10-26 DIAGNOSIS — F603 Borderline personality disorder: Secondary | ICD-10-CM

## 2022-10-26 DIAGNOSIS — F3181 Bipolar II disorder: Secondary | ICD-10-CM

## 2022-10-26 MED ORDER — TRAZODONE HCL 50 MG PO TABS: 50.0000 mg | ORAL_TABLET | Freq: Every evening | ORAL | 5 refills | Status: DC | PRN

## 2022-10-26 MED ORDER — ARIPIPRAZOLE 5 MG PO TABS: 5.0000 mg | ORAL_TABLET | Freq: Every day | ORAL | 5 refills | Status: DC

## 2022-10-26 MED ORDER — HYDROXYZINE HCL 25 MG PO TABS: 25.0000 mg | ORAL_TABLET | Freq: Three times a day (TID) | ORAL | 5 refills | Status: DC | PRN

## 2022-10-26 NOTE — Patient Instructions (Signed)
Follow-up in 4 weeks
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
## 2022-11-16 ENCOUNTER — Encounter (HOSPITAL_COMMUNITY): Payer: Self-pay | Admitting: Psychiatry

## 2022-11-16 ENCOUNTER — Ambulatory Visit (INDEPENDENT_AMBULATORY_CARE_PROVIDER_SITE_OTHER): Payer: Medicaid Other | Admitting: Psychiatry

## 2022-11-16 VITALS — BP 124/76 | HR 55 | Wt 216.1 lb

## 2022-11-16 DIAGNOSIS — F411 Generalized anxiety disorder: Secondary | ICD-10-CM

## 2022-11-16 DIAGNOSIS — F3181 Bipolar II disorder: Secondary | ICD-10-CM

## 2022-11-16 DIAGNOSIS — F603 Borderline personality disorder: Secondary | ICD-10-CM | POA: Diagnosis not present

## 2022-11-16 DIAGNOSIS — Z79899 Other long term (current) drug therapy: Secondary | ICD-10-CM

## 2022-11-16 DIAGNOSIS — Z758 Other problems related to medical facilities and other health care: Secondary | ICD-10-CM | POA: Diagnosis not present

## 2022-11-16 MED ORDER — TRAZODONE HCL 50 MG PO TABS: 50.0000 mg | ORAL_TABLET | Freq: Every evening | ORAL | 1 refills | Status: DC | PRN

## 2022-11-16 MED ORDER — HYDROXYZINE HCL 25 MG PO TABS: 25.0000 mg | ORAL_TABLET | Freq: Three times a day (TID) | ORAL | 6 refills | Status: DC | PRN

## 2022-11-16 MED ORDER — HYDROXYZINE HCL 25 MG PO TABS: 25.0000 mg | ORAL_TABLET | Freq: Three times a day (TID) | ORAL | 1 refills | Status: DC | PRN

## 2022-11-16 MED ORDER — TRAZODONE HCL 50 MG PO TABS: 50.0000 mg | ORAL_TABLET | Freq: Every evening | ORAL | 6 refills | Status: DC | PRN

## 2022-11-16 NOTE — Progress Notes (Signed)
BH MD/PA/NP OP Progress Note  11/16/2022 1:03 PM Sharon Ward  MRN:  [id number]  Chief Complaint:  Chief Complaint  Patient presents with   Follow-up   HPI: Sharon Ward is a 27 yo Caucasian female with a history of bipolar 2 disorder, borderline personality disorder, GAD, ODD, multiple past psychiatric hospitalizations, multiple suicidal attempts by overdosing who presented to [facility] outpatient clinic for medication management follow-up.   Pt reports that her mood is " depressed". She reports that she was not able to refill hydroxyzine and trazodone as the pharmacy was not giving her 7 days supply. She has not been taking trazodone and hydroxyzine since last visit.  She got 30 days Rx for Abilify and has been taking it regularly.  She is feeling depressed and anxious and has a lot of mood swings.  She reports that her mood is labile and sometimes she is crying and then she feels depressed depressed and anxious.  She has not been sleeping well and sleeps only for 3 to 5 hours. She reports variable appetite and sometimes she does not eat much and sometimes she eats excessively. She does not eat to the point of vomit or nausea.  She does not have any body image issues.  Currently, she denies active or passive suicidal ideations, homicidal ideations, auditory and visual hallucinations.  She denies any self-injurious behaviors. She denies any medication side effects and has been tolerating it well. She reports no change in her current stressors.  Her kids are still with DSS.  She reports that she has done necessary anger management training, parental crossing and domestic violence classes also but still DSS is planning to give her kids for adoption due to her unstable mental health.  She reports that her relationship with kid's dad is much better and they are supporting each other as their kids are with DSS.  She spent 2 week at her boyfriend's house recently. She denies drinking alcohol  and denies using  any illicit drugs.  Patient was not able to do EKG as she does not have a primary care.  Discussed the importance of getting EKG ASAP patient has h/o of elevated QTc in the past.  She agrees to go to an urgent care to do an EKG.  Will also put referral for PCP. Patient is alert and oriented x 4,  calm, pleasant, cooperative, and fully engaged in conversation during the encounter.  Her thought process is linear with coherent speech . She does not appear to be responding to internal/external stimuli.    Visit Diagnosis:    ICD-10-CM   1. Bipolar 2 disorder, major depressive episode  F31.81 traZODone (DESYREL) 50 MG tablet    hydrOXYzine (ATARAX) 25 MG tablet    DISCONTINUED: traZODone (DESYREL) 50 MG tablet    DISCONTINUED: hydrOXYzine (ATARAX) 25 MG tablet    2. Borderline personality disorder  F60.3     3. GAD (generalized anxiety disorder)  F41.1     4. Does not have primary care provider  Z75.8 Ambulatory referral to Internal Medicine    5. Long term current use of antipsychotic medication  Z79.899 EKG 12-Lead      Past Psychiatric History: Previous Psych Diagnoses: ODD, bipolar 2 disorder, GAD, borderline personality.   Prior inpatient treatment: Starting at age 24 years old due to anger outburst.Reports fist hospitalization to be at [facility]. Pt reports multiple mental health hospitalizations around [place] at various hospitalizations. [facility] admissions are: 10/01/2022, 07/12/2022,11/09/2021, 05/04/2021, 04/02/2021, 01/12/2021,  10/25/2020. Pt also has multiple ER visits at several hospitals in the [place] related to suicidal ideations.    Current/prior outpatient treatment: Patient an Assertive community treatment team (ACTT) psychotherapeutic services but she did not like them and now looking for a new ACT team.  At this time, she does not have any ACT services available. Prior rehab hx: Denies Psychotherapy hx: Prior to [facility], [facility]  [facility]   History of suicide attempts: Reports a history of multiple suicidal attempts via overdosing on medications. States that her ACTT was only providing 7 days supply of medications at a time due to history of multiple overdoses.    History of homicide or aggression: anger outbursts in childhood and adolescence. Denies homicides.    Psychiatric medication history: States that she has been on most medications including Abilify, Zyprexa, Vraylar, Zoloft, Lexapro, Wellbutrin, and as per chart review, she has had trials of Latuda & Geodon.    Psychiatric medication compliance history: Reports compliance Neuromodulation history: Denies Current therapist: none  Past Medical History:  Past Medical History:  Diagnosis Date   Asthma    Bipolar 1 disorder, mixed    Depression    Generalized anxiety disorder    Intentional drug overdose 11/03/2020   Low-lying placenta 01/07/2022   Resolved 03/04/22   Relationship dysfunction    Suicide attempt 11/02/2020    Past Surgical History:  Procedure Laterality Date   PILONIDAL CYST / SINUS EXCISION  09/11/2013   PILONIDAL CYST EXCISION  05/17/2014   Pilonidal cystectomy with cleft lip    Family Psychiatric History: Psych:Paternal grandfather with MDD & GAD. Father with Schizophrenia, MDD & GAD. Brother with MDD, "anger issues", and GAD. Paternal cousin with MDD & GAD. Paternal uncle with PTSD & MDD, Paternal aunt with MDD & Bipolar d/o.  Paternal great grandfather completed suicide by shooting himself with a gun.   SA/HA: Great grandfather committed suicide. Substance use family hx: Denies  Family History:  Family History  Problem Relation Age of Onset   Asthma Mother    Diabetes Mother    Healthy Mother    Hypertension Father    Asthma Father    Diabetes Father    Healthy Father    Asthma Brother    Hypertension Paternal Uncle    Diabetes Paternal Grandmother    Stroke Paternal Grandfather    Heart  disease Paternal Grandfather    Hypertension Paternal Grandfather    Diabetes Paternal Grandfather     Social History:  Social History   Socioeconomic History   Marital status: Single    Spouse name: Not on file   Number of children: 1   Years of education: 10   Highest education level: 10th grade  Occupational History   Not on file  Tobacco Use   Smoking status: Former    Packs/day: 1.00    Years: 15.00    Additional pack years: 0.00    Total pack years: 15.00    Types: Cigarettes    Quit date: 07/14/2021    Years since quitting: 1.3   Smokeless tobacco: Never   Tobacco comments:    only smoke a "couple" of cigarettes when stressed or anxious, socially with friends per [facility] chart  Vaping Use   Vaping Use: Former   Start date: 08/10/2003   Quit date: 05/04/2021  Substance and Sexual Activity   Alcohol use: Not Currently    Comment: occasional prior to pregnancy   Drug  use: Not Currently    Frequency: 4.0 times per week    Types: Marijuana    Comment: No Delta 9 since February 2023   Sexual activity: Not Currently    Partners: Male    Birth control/protection: None  Other Topics Concern   Not on file  Social History Narrative   Not on file   Social Determinants of Health   Financial Resource Strain: Not on file  Food Insecurity: No Food Insecurity (10/18/2022)   Hunger Vital Sign    Worried About Running Out of Food in the Last Year: Never true    Ran Out of Food in the Last Year: Never true  Transportation Needs: No Transportation Needs (10/18/2022)   PRAPARE - Administrator, Civil ServiceTransportation    Lack of Transportation (Medical): No    Lack of Transportation (Non-Medical): No  Physical Activity: Not on file  Stress: Not on file  Social Connections: Not on file    Allergies:  Allergies  Allergen Reactions   Ascorbate Rash   Citrus Rash   Coconut Flavor Rash   Lamotrigine Rash   Orange (Diagnostic) Rash   Peach Flavor Rash   Pear Rash   Pineapple Rash    Metabolic  Disorder Labs: Lab Results  Component Value Date   HGBA1C 5.2 10/19/2022   MPG 103 10/19/2022   MPG 97 10/04/2022   Lab Results  Component Value Date   PROLACTIN 48.1 (H) 11/10/2021   Lab Results  Component Value Date   CHOL 185 10/19/2022   TRIG 117 10/19/2022   HDL 61 10/19/2022   CHOLHDL 3.0 10/19/2022   VLDL 23 10/19/2022   LDLCALC 101 (H) 10/19/2022   LDLCALC 129 (H) 07/13/2022   Lab Results  Component Value Date   TSH 5.030 (H) 10/19/2022   TSH 2.637 10/04/2022    Therapeutic Level Labs: No results found for: "LITHIUM" No results found for: "VALPROATE" No results found for: "CBMZ"  Current Medications: Current Outpatient Medications  Medication Sig Dispense Refill   albuterol (VENTOLIN HFA) 108 (90 Base) MCG/ACT inhaler Inhale 1 puff into the lungs every 6 (six) hours as needed for wheezing or shortness of breath.     ARIPiprazole (ABILIFY) 5 MG tablet Take 1 tablet (5 mg total) by mouth at bedtime. 7 tablet 5   hydrOXYzine (ATARAX) 25 MG tablet Take 1 tablet (25 mg total) by mouth 3 (three) times daily as needed for anxiety. Only refill 7 days at a time. 21 tablet 6   nicotine (NICODERM CQ - DOSED IN MG/24 HOURS) 14 mg/24hr patch Place 1 patch (14 mg total) onto the skin daily. (May buy from over the counter): For smoking cessation. 28 patch 0   nicotine polacrilex (NICORETTE) 2 MG gum Take 1 each (2 mg total) by mouth as needed for smoking cessation. 100 tablet 0   traZODone (DESYREL) 50 MG tablet Take 1 tablet (50 mg total) by mouth at bedtime as needed for sleep. Only refill 7 days at a time. 7 tablet 6   No current facility-administered medications for this visit.     Musculoskeletal: Strength & Muscle Tone: within normal limits Gait & Station: normal Patient leans: N/A  Psychiatric Specialty Exam: Review of Systems  Blood pressure 124/76, pulse (!) 55, weight 216 lb 1.3 oz (98 kg), SpO2 100 %, not currently breastfeeding.Body mass index is 39.52 kg/m.   General Appearance: Well Groomed  Eye Contact:  Good  Speech:  Clear and Coherent and Normal Rate  Volume:  Normal  Mood:  Anxious and Dysphoric  Affect:  Full Range  Thought Process:  Coherent and Linear  Orientation:  Full (Time, Place, and Person)  Thought Content: Logical   Suicidal Thoughts:  No  Homicidal Thoughts:  No  Memory:  intact  Judgement:  Fair  Insight:  Good  Psychomotor Activity:  Normal  Concentration:  Concentration: Good and Attention Span: Good  Recall:  Good  Fund of Knowledge: Good  Language: Good  Akathisia:  No  Handed:    AIMS (if indicated): not done  Assets:  Communication Skills Desire for Improvement Housing Resilience Social Support  ADL's:  Intact  Cognition: WNL  Sleep:  Fair   Screenings: AIMS    Flowsheet Row [facility]) [facility] Ad[facility] (Discharged) from OP [facility] (Discharged) [facility] (Dis[facility]) [facility]  AIMS Total Score 0 0 0 0 0      AUDIT    Flowsheet Row [facility]) [facility] Ad[facility] (Discharged[facility] Ad[facility] (Discharged) [facility] Admission (Discharged) from OP Visit [facility] Admission (Discharged) from OP Visit [facility]  Alcohol Use Disorder Identification Test Final Score (AUDIT) 0 0 0 0 0      GAD-7    Flowsheet Row Office Visit from 09/16/2022 in [facility] Office Visit from 08/18/2022 in [facility] [employer] [facility] Routine Prenatal from 05/26/2022 in [facility] [employer] Routine Prenatal from 05/19/2022 in [facility] Routine Prenatal from 05/13/2022 in [facility]  Total GAD-7 Score 17 15 4 6 4       PHQ2-9    Flowsheet Row [facility] ED from 08/20/2022 in [facility] Office Visit from 08/18/2022 in [facility] Routine Prenatal from 05/26/2022 in [facility] Routine Prenatal from 05/19/2022 in [facility]  PHQ-2 Total Score 6 4 4  0 1  PHQ-9 Total Score 21 10 15 3 4       Flowsheet Row [facility]) [facility] from 10/16/2022 in [facility] ED from 10/04/2022 in [facility]  C-SSRS RISK CATEGORY No Risk High Risk No Risk        Assessment and Plan: Sharon Ward is a 27 yo Caucasian female with a history of bipolar 2 disorder, borderline personality disorder, GAD, ODD, multiple past psychiatric hospitalizations, multiple suicidal attempts by overdosing who presented to [facility] outpatient clinic for medication management follow-up.  Patient was admitted to [facility] after a suicide attempt by overdosing on her medications.  She was discharged from [facility] on 10/21/2022 on Abilify 5 mg nightly, hydroxyzine 25  mg 3 times daily as needed for anxiety, trazodone 50 mg nightly as needed for sleep, NicoDerm patch 14 mg daily and nicotine gum 2 mg as needed for nicotine  dependence. Patient reports that she was able to refill her trazodone and hydroxyzine and has been off these medications.  She has been taking Abilify regularly.  She is complaining of depression, anxiety , poor sleep and mood swings.  Her last EKG shows Qtc 488 which was done 2 days after she overdosed on Abilify, Trazodone and Prozac. Abilify is least antipsychotic to cause QTc prolongation.  Considered increasing Abilify to 10 mg but patient has not had repeat EKG yet as she does not have any PCP.  Patient is planning to get repeat EKG from urgent care this week.  Will continue current medications at the same dose until she gets repeat EKG to prevent deterioration of her symptoms. Will give 7 days supply of meds at a time due to high risk of overdose.   Called pharmacy and they'll be able to fill Rx for 7 days at a time.    1. Bipolar 2 disorder, major depressive episode (HCC)   -Restart traZODone (DESYREL) 50 MG tablet; Take 1 tablet (50 mg total) by mouth at bedtime as needed for sleep.  7 days supply at a time with refills.  -Restart  hydrOXYzine (ATARAX) 25 MG tablet; Take 1 tablet (25 mg total) by mouth 3 (three) times daily as needed for anxiety.   7 days supply at a time with refills.  -Continue ARIPiprazole (ABILIFY) 5 MG tablet; Take 1 tablet (5 mg total) by mouth at bedtime. Patient has enough for now.  - Get repeat EKG 12-Lead at urgent care.  Her last EKG shows QTc 488 which was done 2 days after she overdosed on Abilify, Trazodone and Prozac. Will need repeat EKG to increase dose of Abilify.    2.  Borderline personality disorder Continue psychotherapy at [facility].      Follow-up in 6 weeks.  Collaboration of Care: Collaboration of Care: Other Dr Josephina Shih  Patient/Guardian was advised Release of Information must be obtained prior to any record release in order to collaborate their care with an outside provider. Patient/Guardian was advised if they  have not already done so to contact the registration department to sign all necessary forms in order for [place] to release information regarding their care.   Consent: Patient/Guardian gives verbal consent for treatment and assignment of benefits for services provided during this visit. Patient/Guardian expressed understanding and agreed to proceed.    Karsten Ro, MD 11/16/2022, 1:03 PM

## 2022-11-24 ENCOUNTER — Telehealth: Payer: Self-pay

## 2022-11-25 ENCOUNTER — Telehealth (HOSPITAL_COMMUNITY): Payer: Self-pay | Admitting: Psychiatry

## 2022-11-25 ENCOUNTER — Encounter (HOSPITAL_COMMUNITY): Payer: No Payment, Other | Admitting: Psychiatry

## 2022-11-25 NOTE — Telephone Encounter (Signed)
Reached out to patient regarding her appointment today and following up on EKG. Patient hasn't had EKG yet but planning to do it on Monday. Patient reports that she found a new ACT team and will be following up with them for med management and therapy.  Karsten Ro, MD PGY3 Psychiatry Resident  [facility]

## 2022-11-30 NOTE — Telephone Encounter (Signed)
Called and was not able to reach patient to discuss concerns. She did not answer. LM for patient to call the office to discuss her concerns.

## 2022-12-02 ENCOUNTER — Emergency Department (HOSPITAL_COMMUNITY)
Admission: EM | Admit: 2022-12-02 | Discharge: 2022-12-03 | Disposition: A | Discharge status: Home / Self Care | Payer: Medicaid Other | Attending: Student | Admitting: Student

## 2022-12-02 ENCOUNTER — Other Ambulatory Visit: Payer: Self-pay

## 2022-12-02 ENCOUNTER — Emergency Department (HOSPITAL_COMMUNITY): Payer: Medicaid Other

## 2022-12-02 ENCOUNTER — Encounter (HOSPITAL_COMMUNITY): Payer: Self-pay

## 2022-12-02 DIAGNOSIS — S0990XA Unspecified injury of head, initial encounter: Secondary | ICD-10-CM

## 2022-12-02 DIAGNOSIS — S0083XA Contusion of other part of head, initial encounter: Secondary | ICD-10-CM | POA: Diagnosis not present

## 2022-12-02 MED ORDER — NAPROXEN 250 MG PO TABS
500.0000 mg | ORAL_TABLET | Freq: Once | ORAL | Status: AC
  Administered 2022-12-03: 500 mg via ORAL
  Filled 2022-12-02: qty 2

## 2022-12-02 MED ORDER — NAPROXEN 500 MG PO TABS: 500.0000 mg | ORAL_TABLET | Freq: Two times a day (BID) | ORAL | 0 refills | Status: DC | PRN
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
## 2022-12-02 NOTE — Discharge Instructions (Addendum)
Take Naproxen, as prescribed, for pain. Follow up with a primary care doctor as needed, should symptoms persist.
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
## 2022-12-03 NOTE — ED Notes (Signed)
Pt provided with AVS.  Education complete; all questions answered.    Pt provided with bus pass per request.  Pt leaving ED in stable condition at this time, ambulatory with all belongings and bus pass in hand.

## 2022-12-08 ENCOUNTER — Encounter: Payer: Self-pay | Admitting: Family Medicine

## 2022-12-19 ENCOUNTER — Ambulatory Visit (HOSPITAL_COMMUNITY)
Admission: EM | Admit: 2022-12-19 | Discharge: 2022-12-20 | Disposition: A | Discharge status: Other Acute Inpatient Hospital | Payer: Medicaid Other | Attending: Urology | Admitting: Urology

## 2022-12-19 DIAGNOSIS — F3181 Bipolar II disorder: Secondary | ICD-10-CM | POA: Diagnosis not present

## 2022-12-19 DIAGNOSIS — F603 Borderline personality disorder: Secondary | ICD-10-CM | POA: Insufficient documentation

## 2022-12-19 DIAGNOSIS — Z91148 Patient's other noncompliance with medication regimen for other reason: Secondary | ICD-10-CM | POA: Insufficient documentation

## 2022-12-19 DIAGNOSIS — R45851 Suicidal ideations: Secondary | ICD-10-CM | POA: Insufficient documentation

## 2022-12-19 LAB — POCT URINE DRUG SCREEN - MANUAL ENTRY (I-SCREEN)
POC Amphetamine UR: NOT DETECTED
POC Buprenorphine (BUP): NOT DETECTED
POC Cocaine UR: NOT DETECTED
POC Marijuana UR: POSITIVE — AB
POC Methadone UR: NOT DETECTED
POC Methamphetamine UR: NOT DETECTED
POC Morphine: NOT DETECTED
POC Oxazepam (BZO): NOT DETECTED
POC Oxycodone UR: NOT DETECTED
POC Secobarbital (BAR): NOT DETECTED

## 2022-12-19 LAB — POCT PREGNANCY, URINE: Preg Test, Ur: NEGATIVE

## 2022-12-19 MED ORDER — HYDROXYZINE HCL 25 MG PO TABS
25.0000 mg | ORAL_TABLET | Freq: Three times a day (TID) | ORAL | Status: DC | PRN
  Administered 2022-12-19: 25 mg via ORAL
  Filled 2022-12-19: qty 1

## 2022-12-19 MED ORDER — MAGNESIUM HYDROXIDE 400 MG/5ML PO SUSP: 30.0000 mL | Freq: Every day | ORAL | Status: DC | PRN

## 2022-12-19 MED ORDER — TRAZODONE HCL 50 MG PO TABS
50.0000 mg | ORAL_TABLET | Freq: Every evening | ORAL | Status: DC | PRN
  Administered 2022-12-19: 50 mg via ORAL
  Filled 2022-12-19: qty 1

## 2022-12-19 MED ORDER — ALUM & MAG HYDROXIDE-SIMETH 200-200-20 MG/5ML PO SUSP: 30.0000 mL | ORAL | Status: DC | PRN

## 2022-12-19 MED ORDER — ACETAMINOPHEN 325 MG PO TABS: 650.0000 mg | ORAL_TABLET | Freq: Four times a day (QID) | ORAL | Status: DC | PRN
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
## 2022-12-19 NOTE — ED Provider Notes (Incomplete)
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 [address]    WBC 10/04/2022 8.6  4.0 - 10.5 K/uL Final   RBC 10/04/2022 4.49  3.87 - 5.11 MIL/uL Final   Hemoglobin 10/04/2022 13.9  12.0 - 15.0 g/dL Final   HCT 60/45/4098 40.3  36.0 - 46.0 % Final   MCV 10/04/2022 89.8  80.0 - 100.0 fL Final   MCH 10/04/2022 31.0  26.0 - 34.0 pg Final   MCHC 10/04/2022 34.5  30.0 - 36.0 g/dL Final   RDW 11/91/4782 12.3  11.5 - 15.5 % Final   Platelets 10/04/2022 255  150 - 400 K/uL Final   nRBC 10/04/2022 0.0  0.0 - 0.2 % Final   Neutrophils Relative % 10/04/2022 61  % Final   Neutro Abs 10/04/2022 5.2  1.7 - 7.7 K/uL Final   Lymphocytes Relative 10/04/2022 30  % Final   Lymphs Abs 10/04/2022 2.5  0.7 - 4.0 K/uL Final   Monocytes Relative 10/04/2022 7  % Final   Monocytes Absolute 10/04/2022 0.6  0.1 - 1.0 K/uL Final   Eosinophils Relative 10/04/2022 1  % Final   Eosinophils Absolute 10/04/2022 0.1  0.0 - 0.5 K/uL Final   Basophils Relative 10/04/2022 1  % Final   Basophils Absolute 10/04/2022 0.1  0.0 - 0.1 K/uL Final   Immature Granulocytes 10/04/2022 0  % Final   Abs Immature Granulocytes 10/04/2022 0.01  0.00 - 0.07 K/uL Final   Performed at [facility], [address], [place], [place] [address]   Sodium 10/04/2022 140  135 - 145 mmol/L Final   Potassium 10/04/2022 4.1  3.5 - 5.1 mmol/L Final   Chloride 10/04/2022 103  98 - 111 mmol/L Final   CO2 10/04/2022 26  22 - 32 mmol/L Final   Glucose, Bld 10/04/2022 95  70 - 99 mg/dL Final   Glucose reference range applies only to samples taken after fasting for at least 8 hours.   BUN 10/04/2022 11  6 - 20 mg/dL Final   Creatinine, Ser 10/04/2022 0.84  0.44 - 1.00 mg/dL Final   Calcium 30/86/5784 10.1  8.9 - 10.3 mg/dL Final   Total Protein 69/62/9528 6.9  6.5 - 8.1 g/dL Final   Albumin 41/32/4401 4.2  3.5 - 5.0 g/dL Final   AST 02/72/5366 23  15 - 41 U/L Final   ALT 10/04/2022 26  0 - 44 U/L Final   Alkaline Phosphatase 10/04/2022 63  38 - 126 U/L Final   Total  Bilirubin 10/04/2022 0.3  0.3 - 1.2 mg/dL Final   GFR, Estimated 10/04/2022 >60  >60 mL/min Final   Comment: (NOTE) Calculated using the CKD-EPI Creatinine Equation (2021)    Anion gap 10/04/2022 11  5 - 15 Final   Performed at [facility], [address] [place], [place] [address]   Hgb A1c MFr Bld 10/04/2022 5.0  4.8 - 5.6 % Final   Comment: (NOTE)         Prediabetes: 5.7 - 6.4         Diabetes: >6.4         Glycemic control for adults with diabetes: <7.0    Mean Plasma Glucose 10/04/2022 97  mg/dL Final   Comment: (NOTE) Performed At: [facility] [address] [place], [place] [id number] Jolene Schimke MD VF:[id number]    TSH 10/04/2022 2.637  0.350 - 4.500 uIU/mL Final  Comment: Performed by a 3rd Generation assay with a functional sensitivity of <=0.01 uIU/mL. Performed at [facility], [address], [place], [place] [address]    POC Amphetamine UR 10/04/2022 None Detected  NONE DETECTED (Cut Off Level 1000 ng/mL) Final   POC Secobarbital (BAR) 10/04/2022 None Detected  NONE DETECTED (Cut Off Level 300 ng/mL) Final   POC Buprenorphine (BUP) 10/04/2022 None Detected  NONE DETECTED (Cut Off Level 10 ng/mL) Final   POC Oxazepam (BZO) 10/04/2022 None Detected  NONE DETECTED (Cut Off Level 300 ng/mL) Final   POC Cocaine UR 10/04/2022 None Detected  NONE DETECTED (Cut Off Level 300 ng/mL) Final   POC Methamphetamine UR 10/04/2022 None Detected  NONE DETECTED (Cut Off Level 1000 ng/mL) Final   POC Morphine 10/04/2022 None Detected  NONE DETECTED (Cut Off Level 300 ng/mL) Final   POC Methadone UR 10/04/2022 None Detected  NONE DETECTED (Cut Off Level 300 ng/mL) Final   POC Oxycodone UR 10/04/2022 None Detected  NONE DETECTED (Cut Off Level 100 ng/mL) Final   POC Marijuana UR 10/04/2022 None Detected  NONE DETECTED (Cut Off Level 50 ng/mL) Final   SARSCOV2ONAVIRUS 2 AG 10/04/2022 NEGATIVE  NEGATIVE Final   Comment: (NOTE) SARS-CoV-2 antigen NOT DETECTED.    Negative results are presumptive.  Negative results do not preclude SARS-CoV-2 infection and should not be used as the sole basis for treatment or other patient management decisions, including infection  control decisions, particularly in the presence of clinical signs and  symptoms consistent with COVID-19, or in those who have been in contact with the virus.  Negative results must be combined with clinical observations, patient history, and epidemiological information. The expected result is Negative.  Fact Sheet for Patients: https://www.jennings-kim.com/  Fact Sheet for Healthcare Providers: https://alexander-rogers.biz/  This test is not yet approved or cleared by the [place] FDA and  has been authorized for detection and/or diagnosis of SARS-CoV-2 by FDA under an Emergency Use Authorization (EUA).  This EUA will remain in effect (meaning this test can be used) for the duration of  the COV                          ID-19 declaration under Section 564(b)(1) of the Act, 21 U.S.C. section 360bbb-3(b)(1), unless the authorization is terminated or revoked sooner.     Preg Test, Ur 10/04/2022 NEGATIVE  NEGATIVE Final   Comment:        THE SENSITIVITY OF THIS METHODOLOGY IS >24 mIU/mL   Admission on 08/30/2022, Discharged on 08/30/2022  Component Date Value Ref Range Status   Sodium 08/30/2022 138  135 - 145 mmol/L Final   Potassium 08/30/2022 3.7  3.5 - 5.1 mmol/L Final   Chloride 08/30/2022 105  98 - 111 mmol/L Final   CO2 08/30/2022 26  22 - 32 mmol/L Final   Glucose, Bld 08/30/2022 96  70 - 99 mg/dL Final   Glucose reference range applies only to samples taken after fasting for at least 8 hours.   BUN 08/30/2022 12  6 - 20 mg/dL Final   Creatinine, Ser 08/30/2022 0.75  0.44 - 1.00 mg/dL Final   Calcium 60/45/4098 9.3  8.9 - 10.3 mg/dL Final   Total Protein 11/91/4782 7.8  6.5 - 8.1 g/dL Final   Albumin 95/62/1308 4.3  3.5 - 5.0 g/dL Final    AST 65/78/4696 29  15 - 41 U/L Final   ALT 08/30/2022 36  0 -  44 U/L Final   Alkaline Phosphatase 08/30/2022 73  38 - 126 U/L Final   Total Bilirubin 08/30/2022 0.3  0.3 - 1.2 mg/dL Final   GFR, Estimated 08/30/2022 >60  >60 mL/min Final   Comment: (NOTE) Calculated using the CKD-EPI Creatinine Equation (2021)    Anion gap 08/30/2022 7  5 - 15 Final   Performed at [facility], [address], [place], [place] [address]   Alcohol, Ethyl (B) 08/30/2022 <10  <10 mg/dL Final   Comment: (NOTE) Lowest detectable limit for serum alcohol is 10 mg/dL.  For medical purposes only. Performed at [facility], [address], [place], [place] [address]    Salicylate Lvl 08/30/2022 <7.0 (L)  7.0 - 30.0 mg/dL Final   Performed at [facility], [address] [place], [place] [address]   Acetaminophen (Tylenol), Serum 08/30/2022 <10 (L)  10 - 30 ug/mL Final   Comment: (NOTE) Therapeutic concentrations vary significantly. A range of 10-30 ug/mL  may be an effective concentration for many patients. However, some  are best treated at concentrations outside of this range. Acetaminophen concentrations >150 ug/mL at 4 hours after ingestion  and >50 ug/mL at 12 hours after ingestion are often associated with  toxic reactions.  Performed at [facility], [address], [place], [place] [address]    WBC 08/30/2022 8.1  4.0 - 10.5 K/uL Final   RBC 08/30/2022 4.30  3.87 - 5.11 MIL/uL Final   Hemoglobin 08/30/2022 13.2  12.0 - 15.0 g/dL Final   HCT 29/56/2130 39.7  36.0 - 46.0 % Final   MCV 08/30/2022 92.3  80.0 - 100.0 fL Final   MCH 08/30/2022 30.7  26.0 - 34.0 pg Final   MCHC 08/30/2022 33.2  30.0 - 36.0 g/dL Final   RDW 86/57/8469 13.3  11.5 - 15.5 % Final   Platelets 08/30/2022 269  150 - 400 K/uL Final   nRBC 08/30/2022 0.0  0.0 - 0.2 % Final   Performed at [facility], [address],  [place], [place] [address]   Opiates 08/30/2022 NONE DETECTED  NONE DETECTED Final   Cocaine 08/30/2022 NONE DETECTED  NONE DETECTED Final   Benzodiazepines 08/30/2022 NONE DETECTED  NONE DETECTED Final   Amphetamines 08/30/2022 NONE DETECTED  NONE DETECTED Final   Tetrahydrocannabinol 08/30/2022 NONE DETECTED  NONE DETECTED Final   Barbiturates 08/30/2022 NONE DETECTED  NONE DETECTED Final   Comment: (NOTE) DRUG SCREEN FOR MEDICAL PURPOSES ONLY.  IF CONFIRMATION IS NEEDED FOR ANY PURPOSE, NOTIFY LAB WITHIN 5 DAYS.  LOWEST DETECTABLE LIMITS FOR URINE DRUG SCREEN Drug Class                     Cutoff (ng/mL) Amphetamine and metabolites    1000 Barbiturate and metabolites    200 Benzodiazepine                 200 Opiates and metabolites        300 Cocaine and metabolites        300 THC                            50 Performed at [facility], [address], [place], [place] [address]    Preg Test, Ur 08/30/2022 NEGATIVE  NEGATIVE Final   Comment:        THE SENSITIVITY OF THIS METHODOLOGY IS >20 mIU/mL.  Performed at [facility], [address] [place], [place] [address]    SARS Coronavirus 2 by RT PCR 08/30/2022 NEGATIVE  NEGATIVE Final   Comment: (NOTE) SARS-CoV-2 target nucleic acids are NOT DETECTED.  The SARS-CoV-2 RNA is generally detectable in upper respiratory specimens during the acute phase of infection. The lowest concentration of SARS-CoV-2 viral copies this assay can detect is 138 copies/mL. A negative result does not preclude SARS-Cov-2 infection and should not be used as the sole basis for treatment or other patient management decisions. A negative result may occur with  improper specimen collection/handling, submission of specimen other than nasopharyngeal swab, presence of viral mutation(s) within the areas targeted by this assay, and inadequate number of viral copies(<138 copies/mL). A negative result must be combined  with clinical observations, patient history, and epidemiological information. The expected result is Negative.  Fact Sheet for Patients:  BloggerCourse.com  Fact Sheet for Healthcare Providers:  SeriousBroker.it  This test is no                          t yet approved or cleared by the [place] FDA and  has been authorized for detection and/or diagnosis of SARS-CoV-2 by FDA under an Emergency Use Authorization (EUA). This EUA will remain  in effect (meaning this test can be used) for the duration of the COVID-19 declaration under Section 564(b)(1) of the Act, 21 U.S.C.section 360bbb-3(b)(1), unless the authorization is terminated  or revoked sooner.       Influenza A by PCR 08/30/2022 NEGATIVE  NEGATIVE Final   Influenza B by PCR 08/30/2022 NEGATIVE  NEGATIVE Final   Comment: (NOTE) The Xpert Xpress SARS-CoV-2/FLU/RSV plus assay is intended as an aid in the diagnosis of influenza from Nasopharyngeal swab specimens and should not be used as a sole basis for treatment. Nasal washings and aspirates are unacceptable for Xpert Xpress SARS-CoV-2/FLU/RSV testing.  Fact Sheet for Patients: BloggerCourse.com  Fact Sheet for Healthcare Providers: SeriousBroker.it  This test is not yet approved or cleared by the [place] FDA and has been authorized for detection and/or diagnosis of SARS-CoV-2 by FDA under an Emergency Use Authorization (EUA). This EUA will remain in effect (meaning this test can be used) for the duration of the COVID-19 declaration under Section 564(b)(1) of the Act, 21 U.S.C. section 360bbb-3(b)(1), unless the authorization is terminated or revoked.     Resp Syncytial Virus by PCR 08/30/2022 NEGATIVE  NEGATIVE Final   Comment: (NOTE) Fact Sheet for Patients: BloggerCourse.com  Fact Sheet for Healthcare  Providers: SeriousBroker.it  This test is not yet approved or cleared by the [place] FDA and has been authorized for detection and/or diagnosis of SARS-CoV-2 by FDA under an Emergency Use Authorization (EUA). This EUA will remain in effect (meaning this test can be used) for the duration of the COVID-19 declaration under Section 564(b)(1) of the Act, 21 U.S.C. section 360bbb-3(b)(1), unless the authorization is terminated or revoked.  Performed at [facility], [address], [place], [place] [address]   Admission on 08/20/2022, Discharged on 08/21/2022  Component Date Value Ref Range Status   SARS Coronavirus 2 by RT PCR 08/21/2022 NEGATIVE  NEGATIVE Final   Comment: (NOTE) SARS-CoV-2 target nucleic acids are NOT DETECTED.  The SARS-CoV-2 RNA is generally detectable in upper respiratory specimens during the acute phase of infection. The lowest concentration of SARS-CoV-2 viral copies this assay can detect is 138 copies/mL. A negative result does  not preclude SARS-Cov-2 infection and should not be used as the sole basis for treatment or other patient management decisions. A negative result may occur with  improper specimen collection/handling, submission of specimen other than nasopharyngeal swab, presence of viral mutation(s) within the areas targeted by this assay, and inadequate number of viral copies(<138 copies/mL). A negative result must be combined with clinical observations, patient history, and epidemiological information. The expected result is Negative.  Fact Sheet for Patients:  BloggerCourse.com  Fact Sheet for Healthcare Providers:  SeriousBroker.it  This test is no                          t yet approved or cleared by the [place] FDA and  has been authorized for detection and/or diagnosis of SARS-CoV-2 by FDA under an Emergency Use Authorization (EUA).  This EUA will remain  in effect (meaning this test can be used) for the duration of the COVID-19 declaration under Section 564(b)(1) of the Act, 21 U.S.C.section 360bbb-3(b)(1), unless the authorization is terminated  or revoked sooner.       Influenza A by PCR 08/21/2022 NEGATIVE  NEGATIVE Final   Influenza B by PCR 08/21/2022 NEGATIVE  NEGATIVE Final   Comment: (NOTE) The Xpert Xpress SARS-CoV-2/FLU/RSV plus assay is intended as an aid in the diagnosis of influenza from Nasopharyngeal swab specimens and should not be used as a sole basis for treatment. Nasal washings and aspirates are unacceptable for Xpert Xpress SARS-CoV-2/FLU/RSV testing.  Fact Sheet for Patients: BloggerCourse.com  Fact Sheet for Healthcare Providers: SeriousBroker.it  This test is not yet approved or cleared by the [place] FDA and has been authorized for detection and/or diagnosis of SARS-CoV-2 by FDA under an Emergency Use Authorization (EUA). This EUA will remain in effect (meaning this test can be used) for the duration of the COVID-19 declaration under Section 564(b)(1) of the Act, 21 U.S.C. section 360bbb-3(b)(1), unless the authorization is terminated or revoked.     Resp Syncytial Virus by PCR 08/21/2022 NEGATIVE  NEGATIVE Final   Comment: (NOTE) Fact Sheet for Patients: BloggerCourse.com  Fact Sheet for Healthcare Providers: SeriousBroker.it  This test is not yet approved or cleared by the [place] FDA and has been authorized for detection and/or diagnosis of SARS-CoV-2 by FDA under an Emergency Use Authorization (EUA). This EUA will remain in effect (meaning this test can be used) for the duration of the COVID-19 declaration under Section 564(b)(1) of the Act, 21 U.S.C. section 360bbb-3(b)(1), unless the authorization is terminated or revoked.  Performed at [facility], [address] [place], [place] [address]    WBC 08/21/2022 8.3  4.0 - 10.5 K/uL Final   RBC 08/21/2022 4.68  3.87 - 5.11 MIL/uL Final   Hemoglobin 08/21/2022 14.1  12.0 - 15.0 g/dL Final   HCT 60/45/4098 43.0  36.0 - 46.0 % Final   MCV 08/21/2022 91.9  80.0 - 100.0 fL Final   MCH 08/21/2022 30.1  26.0 - 34.0 pg Final   MCHC 08/21/2022 32.8  30.0 - 36.0 g/dL Final   RDW 11/91/4782 13.4  11.5 - 15.5 % Final   Platelets 08/21/2022 306  150 - 400 K/uL Final   nRBC 08/21/2022 0.0  0.0 - 0.2 % Final   Neutrophils Relative % 08/21/2022 54  % Final   Neutro Abs 08/21/2022 4.6  1.7 - 7.7 K/uL Final   Lymphocytes Relative 08/21/2022 35  % Final   Lymphs Abs 08/21/2022  2.9  0.7 - 4.0 K/uL Final   Monocytes Relative 08/21/2022 6  % Final   Monocytes Absolute 08/21/2022 0.5  0.1 - 1.0 K/uL Final   Eosinophils Relative 08/21/2022 4  % Final   Eosinophils Absolute 08/21/2022 0.3  0.0 - 0.5 K/uL Final   Basophils Relative 08/21/2022 1  % Final   Basophils Absolute 08/21/2022 0.1  0.0 - 0.1 K/uL Final   Immature Granulocytes 08/21/2022 0  % Final   Abs Immature Granulocytes 08/21/2022 0.02  0.00 - 0.07 K/uL Final   Performed at [facility], [address], [place], [place] [address]   Sodium 08/21/2022 140  135 - 145 mmol/L Final   Potassium 08/21/2022 3.7  3.5 - 5.1 mmol/L Final   Chloride 08/21/2022 103  98 - 111 mmol/L Final   CO2 08/21/2022 26  22 - 32 mmol/L Final   Glucose, Bld 08/21/2022 86  70 - 99 mg/dL Final   Glucose reference range applies only to samples taken after fasting for at least 8 hours.   BUN 08/21/2022 10  6 - 20 mg/dL Final   Creatinine, Ser 08/21/2022 0.82  0.44 - 1.00 mg/dL Final   Calcium 69/62/9528 9.7  8.9 - 10.3 mg/dL Final   Total Protein 41/32/4401 6.8  6.5 - 8.1 g/dL Final   Albumin 02/72/5366 4.3  3.5 - 5.0 g/dL Final   AST 44/10/4740 23  15 - 41 U/L Final   ALT 08/21/2022 35  0 - 44 U/L Final   Alkaline Phosphatase 08/21/2022 62  38 - 126 U/L Final    Total Bilirubin 08/21/2022 0.5  0.3 - 1.2 mg/dL Final   GFR, Estimated 08/21/2022 >60  >60 mL/min Final   Comment: (NOTE) Calculated using the CKD-EPI Creatinine Equation (2021)    Anion gap 08/21/2022 11  5 - 15 Final   Performed at [facility], [address], [place], [place] [address]   Alcohol, Ethyl (B) 08/21/2022 <10  <10 mg/dL Final   Comment: (NOTE) Lowest detectable limit for serum alcohol is 10 mg/dL.  For medical purposes only. Performed at [facility], [address], [place], [place] [address]    POC Amphetamine UR 08/21/2022 None Detected  NONE DETECTED (Cut Off Level 1000 ng/mL) Preliminary   POC Secobarbital (BAR) 08/21/2022 None Detected  NONE DETECTED (Cut Off Level 300 ng/mL) Preliminary   POC Buprenorphine (BUP) 08/21/2022 None Detected  NONE DETECTED (Cut Off Level 10 ng/mL) Preliminary   POC Oxazepam (BZO) 08/21/2022 None Detected  NONE DETECTED (Cut Off Level 300 ng/mL) Preliminary   POC Cocaine UR 08/21/2022 None Detected  NONE DETECTED (Cut Off Level 300 ng/mL) Preliminary   POC Methamphetamine UR 08/21/2022 None Detected  NONE DETECTED (Cut Off Level 1000 ng/mL) Preliminary   POC Morphine 08/21/2022 None Detected  NONE DETECTED (Cut Off Level 300 ng/mL) Preliminary   POC Methadone UR 08/21/2022 None Detected  NONE DETECTED (Cut Off Level 300 ng/mL) Preliminary   POC Oxycodone UR 08/21/2022 None Detected  NONE DETECTED (Cut Off Level 100 ng/mL) Preliminary   POC Marijuana UR 08/21/2022 None Detected  NONE DETECTED (Cut Off Level 50 ng/mL) Preliminary   Preg Test, Ur 08/21/2022 Negative  Negative Preliminary   Preg Test, Ur 08/21/2022 NEGATIVE  NEGATIVE Final   Comment:        THE SENSITIVITY OF THIS METHODOLOGY IS >24 mIU/mL   Hospital Outpatient Visit on 08/18/2022  Component Date Value Ref Range Status   SARSCOV2ONAVIRUS 2  AG 08/21/2022 NEGATIVE  NEGATIVE Final   Comment: (NOTE) SARS-CoV-2 antigen NOT DETECTED.   Negative results are  presumptive.  Negative results do not preclude SARS-CoV-2 infection and should not be used as the sole basis for treatment or other patient management decisions, including infection  control decisions, particularly in the presence of clinical signs and  symptoms consistent with COVID-19, or in those who have been in contact with the virus.  Negative results must be combined with clinical observations, patient history, and epidemiological information. The expected result is Negative.  Fact Sheet for Patients: https://www.jennings-kim.com/  Fact Sheet for Healthcare Providers: https://alexander-rogers.biz/  This test is not yet approved or cleared by the [place] FDA and  has been authorized for detection and/or diagnosis of SARS-CoV-2 by FDA under an Emergency Use Authorization (EUA).  This EUA will remain in effect (meaning this test can be used) for the duration of  the COV                          ID-19 declaration under Section 564(b)(1) of the Act, 21 U.S.C. section 360bbb-3(b)(1), unless the authorization is terminated or revoked sooner.    Office Visit on 08/18/2022  Component Date Value Ref Range Status   Preg Test, Ur 08/18/2022 NEGATIVE  NEGATIVE Final   Comment:        THE SENSITIVITY OF THIS METHODOLOGY IS >24 mIU/mL    High risk HPV 08/18/2022 Negative   Final   Adequacy 08/18/2022 Satisfactory for evaluation; transformation zone component PRESENT.   Final   Diagnosis 08/18/2022 - Negative for intraepithelial lesion or malignancy (NILM)   Final   Comment 08/18/2022 Normal Reference Range HPV - Negative   Final  Admission on 07/30/2022, Discharged on 08/05/2022  Component Date Value Ref Range Status   WBC 07/30/2022 8.8  4.0 - 10.5 K/uL Final   RBC 07/30/2022 4.66  3.87 - 5.11 MIL/uL Final   Hemoglobin 07/30/2022 14.0  12.0 - 15.0 g/dL Final   HCT 78/29/5621 43.6  36.0 - 46.0 % Final   MCV 07/30/2022 93.6  80.0 - 100.0 fL Final   MCH  07/30/2022 30.0  26.0 - 34.0 pg Final   MCHC 07/30/2022 32.1  30.0 - 36.0 g/dL Final   RDW 30/86/5784 13.2  11.5 - 15.5 % Final   Platelets 07/30/2022 289  150 - 400 K/uL Final   nRBC 07/30/2022 0.0  0.0 - 0.2 % Final   Performed at [facility], [address] [place], [place] [address]   TSH 07/30/2022 2.904  0.350 - 4.500 uIU/mL Final   Comment: Performed by a 3rd Generation assay with a functional sensitivity of <=0.01 uIU/mL. Performed at [facility], [address] [place], [place] [address]    SARS Coronavirus 2 by RT PCR 08/01/2022 NEGATIVE  NEGATIVE Final   Comment: (NOTE) SARS-CoV-2 target nucleic acids are NOT DETECTED.  The SARS-CoV-2 RNA is generally detectable in upper and lower respiratory specimens during the acute phase of infection. The lowest concentration of SARS-CoV-2 viral copies this assay can detect is 250 copies / mL. A negative result does not preclude SARS-CoV-2 infection and should not be used as the sole basis for treatment or other patient management decisions.  A negative result may occur with improper specimen collection / handling, submission of specimen other than nasopharyngeal swab, presence of viral mutation(s) within the areas targeted by this assay, and inadequate number of viral copies (<250  copies / mL). A negative result must be combined with clinical observations, patient history, and epidemiological information.  Fact Sheet for Patients:   RoadLapTop.co.za  Fact Sheet for Healthcare Providers: http://kim-miller.com/  This test is not yet approved or                           cleared by the [place] FDA and has been authorized for detection and/or diagnosis of SARS-CoV-2 by FDA under an Emergency Use Authorization (EUA).  This EUA will remain in effect (meaning this test can be used) for the duration of the COVID-19 declaration under Section  564(b)(1) of the Act, 21 U.S.C. section 360bbb-3(b)(1), unless the authorization is terminated or revoked sooner.  Performed at [facility], [address], [place], [place] [address]    Sodium 08/03/2022 141  135 - 145 mmol/L Final   Potassium 08/03/2022 4.1  3.5 - 5.1 mmol/L Final   Chloride 08/03/2022 108  98 - 111 mmol/L Final   CO2 08/03/2022 27  22 - 32 mmol/L Final   Glucose, Bld 08/03/2022 115 (H)  70 - 99 mg/dL Final   Glucose reference range applies only to samples taken after fasting for at least 8 hours.   BUN 08/03/2022 12  6 - 20 mg/dL Final   Creatinine, Ser 08/03/2022 0.91  0.44 - 1.00 mg/dL Final   Calcium 60/45/4098 9.8  8.9 - 10.3 mg/dL Final   Total Protein 11/91/4782 7.2  6.5 - 8.1 g/dL Final   Albumin 95/62/1308 4.5  3.5 - 5.0 g/dL Final   AST 65/78/4696 24  15 - 41 U/L Final   ALT 08/03/2022 33  0 - 44 U/L Final   Alkaline Phosphatase 08/03/2022 63  38 - 126 U/L Final   Total Bilirubin 08/03/2022 0.4  0.3 - 1.2 mg/dL Final   GFR, Estimated 08/03/2022 >60  >60 mL/min Final   Comment: (NOTE) Calculated using the CKD-EPI Creatinine Equation (2021)    Anion gap 08/03/2022 6  5 - 15 Final   Performed at [facility], [address], [place], [place] [address]  Admission on 07/28/2022, Discharged on 07/30/2022  Component Date Value Ref Range Status   Sodium 07/28/2022 141  135 - 145 mmol/L Final   Potassium 07/28/2022 3.1 (L)  3.5 - 5.1 mmol/L Final   Chloride 07/28/2022 114 (H)  98 - 111 mmol/L Final   CO2 07/28/2022 21 (L)  22 - 32 mmol/L Final   Glucose, Bld 07/28/2022 84  70 - 99 mg/dL Final   Glucose reference range applies only to samples taken after fasting for at least 8 hours.   BUN 07/28/2022 13  6 - 20 mg/dL Final   Creatinine, Ser 07/28/2022 0.67  0.44 - 1.00 mg/dL Final   Calcium 41/32/4401 7.8 (L)  8.9 - 10.3 mg/dL Final   Total Protein 02/72/5366 5.7 (L)  6.5 - 8.1 g/dL Final   Albumin 44/10/4740 3.4 (L)   3.5 - 5.0 g/dL Final   AST 59/56/3875 22  15 - 41 U/L Final   ALT 07/28/2022 25  0 - 44 U/L Final   Alkaline Phosphatase 07/28/2022 52  38 - 126 U/L Final   Total Bilirubin 07/28/2022 0.4  0.3 - 1.2 mg/dL Final   GFR, Estimated 07/28/2022 >60  >60 mL/min Final   Comment: (NOTE) Calculated using the CKD-EPI Creatinine Equation (2021)    Anion gap 07/28/2022 6  5 - 15 Final  Performed at [facility], [address] [place], [place] [address]   Alcohol, Ethyl (B) 07/28/2022 <10  <10 mg/dL Final   Comment: (NOTE) Lowest detectable limit for serum alcohol is 10 mg/dL.  For medical purposes only. Performed at [facility], [address], [place], [place] [address]    Opiates 07/29/2022 NONE DETECTED  NONE DETECTED Final   Cocaine 07/29/2022 NONE DETECTED  NONE DETECTED Final   Benzodiazepines 07/29/2022 NONE DETECTED  NONE DETECTED Final   Amphetamines 07/29/2022 NONE DETECTED  NONE DETECTED Final   Tetrahydrocannabinol 07/29/2022 NONE DETECTED  NONE DETECTED Final   Barbiturates 07/29/2022 NONE DETECTED  NONE DETECTED Final   Comment: (NOTE) DRUG SCREEN FOR MEDICAL PURPOSES ONLY.  IF CONFIRMATION IS NEEDED FOR ANY PURPOSE, NOTIFY LAB WITHIN 5 DAYS.  LOWEST DETECTABLE LIMITS FOR URINE DRUG SCREEN Drug Class                     Cutoff (ng/mL) Amphetamine and metabolites    1000 Barbiturate and metabolites    200 Benzodiazepine                 200 Opiates and metabolites        300 Cocaine and metabolites        300 THC                            50 Performed at [facility], [address], [place], [place] [address]    WBC 07/28/2022 8.8  4.0 - 10.5 K/uL Final   RBC 07/28/2022 4.15  3.87 - 5.11 MIL/uL Final   Hemoglobin 07/28/2022 12.5  12.0 - 15.0 g/dL Final   HCT 91/47/8295 38.4  36.0 - 46.0 % Final   MCV 07/28/2022 92.5  80.0 - 100.0 fL Final   MCH 07/28/2022 30.1  26.0 - 34.0 pg Final   MCHC 07/28/2022 32.6   30.0 - 36.0 g/dL Final   RDW 62/13/0865 12.9  11.5 - 15.5 % Final   Platelets 07/28/2022 260  150 - 400 K/uL Final   nRBC 07/28/2022 0.0  0.0 - 0.2 % Final   Neutrophils Relative % 07/28/2022 57  % Final   Neutro Abs 07/28/2022 5.1  1.7 - 7.7 K/uL Final   Lymphocytes Relative 07/28/2022 32  % Final   Lymphs Abs 07/28/2022 2.8  0.7 - 4.0 K/uL Final   Monocytes Relative 07/28/2022 8  % Final   Monocytes Absolute 07/28/2022 0.7  0.1 - 1.0 K/uL Final   Eosinophils Relative 07/28/2022 1  % Final   Eosinophils Absolute 07/28/2022 0.1  0.0 - 0.5 K/uL Final   Basophils Relative 07/28/2022 1  % Final   Basophils Absolute 07/28/2022 0.0  0.0 - 0.1 K/uL Final   Immature Granulocytes 07/28/2022 1  % Final   Abs Immature Granulocytes 07/28/2022 0.04  0.00 - 0.07 K/uL Final   Performed at [facility], [address], [place], [place] [address]   I-stat hCG, quantitative 07/28/2022 <5.0  <5 mIU/mL Final   Comment 3 07/28/2022          Final   Comment:   GEST. AGE      CONC.  (mIU/mL)   <=1 WEEK        5 - 50     2 WEEKS       50 - 500     3 WEEKS  100 - 10,000     4 WEEKS     1,000 - 30,000        FEMALE AND NON-PREGNANT FEMALE:     LESS THAN 5 mIU/mL    Salicylate Lvl 07/28/2022 <7.0 (L)  7.0 - 30.0 mg/dL Final   Performed at [facility], [address], [place], [place] [address]   Acetaminophen (Tylenol), Serum 07/28/2022 <10 (L)  10 - 30 ug/mL Final   Comment: (NOTE) Therapeutic concentrations vary significantly. A range of 10-30 ug/mL  may be an effective concentration for many patients. However, some  are best treated at concentrations outside of this range. Acetaminophen concentrations >150 ug/mL at 4 hours after ingestion  and >50 ug/mL at 12 hours after ingestion are often associated with  toxic reactions.  Performed at [facility], [address] [place], [place] [address]    Color, Urine 07/29/2022 YELLOW  YELLOW  Final   APPearance 07/29/2022 HAZY (A)  CLEAR Final   Specific Gravity, Urine 07/29/2022 1.010  1.005 - 1.030 Final   pH 07/29/2022 6.0  5.0 - 8.0 Final   Glucose, UA 07/29/2022 NEGATIVE  NEGATIVE mg/dL Final   Hgb urine dipstick 07/29/2022 NEGATIVE  NEGATIVE Final   Bilirubin Urine 07/29/2022 NEGATIVE  NEGATIVE Final   Ketones, ur 07/29/2022 NEGATIVE  NEGATIVE mg/dL Final   Protein, ur 09/81/1914 NEGATIVE  NEGATIVE mg/dL Final   Nitrite 78/29/5621 NEGATIVE  NEGATIVE Final   Leukocytes,Ua 07/29/2022 MODERATE (A)  NEGATIVE Final   RBC / HPF 07/29/2022 0-5  0 - 5 RBC/hpf Final   WBC, UA 07/29/2022 11-20  0 - 5 WBC/hpf Final   Bacteria, UA 07/29/2022 FEW (A)  NONE SEEN Final   Squamous Epithelial / HPF 07/29/2022 21-50  0 - 5 Final   Mucus 07/29/2022 PRESENT   Final   Performed at [facility], [address], [place], [place] [address]   Magnesium 07/28/2022 2.2  1.7 - 2.4 mg/dL Final   Performed at [facility], [address] [place], [place] [address]   Sodium 07/29/2022 140  135 - 145 mmol/L Final   Potassium 07/29/2022 4.1  3.5 - 5.1 mmol/L Final   Chloride 07/29/2022 110  98 - 111 mmol/L Final   CO2 07/29/2022 23  22 - 32 mmol/L Final   Glucose, Bld 07/29/2022 100 (H)  70 - 99 mg/dL Final   Glucose reference range applies only to samples taken after fasting for at least 8 hours.   BUN 07/29/2022 13  6 - 20 mg/dL Final   Creatinine, Ser 07/29/2022 0.76  0.44 - 1.00 mg/dL Final   Calcium 62/95/2841 8.5 (L)  8.9 - 10.3 mg/dL Final   GFR, Estimated 07/29/2022 >60  >60 mL/min Final   Comment: (NOTE) Calculated using the CKD-EPI Creatinine Equation (2021)    Anion gap 07/29/2022 7  5 - 15 Final   Performed at [facility], [address], [place], [place] [address]   Acetaminophen (Tylenol), Serum 07/29/2022 <10 (L)  10 - 30 ug/mL Final   Comment: (NOTE) Therapeutic concentrations vary significantly. A range of 10-30 ug/mL   may be an effective concentration for many patients. However, some  are best treated at concentrations outside of this range. Acetaminophen concentrations >150 ug/mL at 4 hours after ingestion  and >50 ug/mL at 12 hours after ingestion are often associated with  toxic reactions.  Performed at [facility], [address] [place],  [place] [address]    SARS Coronavirus 2 by RT PCR 07/29/2022 NEGATIVE  NEGATIVE Final   Comment: (NOTE) SARS-CoV-2 target nucleic acids are NOT DETECTED.  The SARS-CoV-2 RNA is generally detectable in upper respiratory specimens during the acute phase of infection. The lowest concentration of SARS-CoV-2 viral copies this assay can detect is 138 copies/mL. A negative result does not preclude SARS-Cov-2 infection and should not be used as the sole basis for treatment or other patient management decisions. A negative result may occur with  improper specimen collection/handling, submission of specimen other than nasopharyngeal swab, presence of viral mutation(s) within the areas targeted by this assay, and inadequate number of viral copies(<138 copies/mL). A negative result must be combined with clinical observations, patient history, and epidemiological information. The expected result is Negative.  Fact Sheet for Patients:  BloggerCourse.com  Fact Sheet for Healthcare Providers:  SeriousBroker.it  This test is no                          t yet approved or cleared by the [place] FDA and  has been authorized for detection and/or diagnosis of SARS-CoV-2 by FDA under an Emergency Use Authorization (EUA). This EUA will remain  in effect (meaning this test can be used) for the duration of the COVID-19 declaration under Section 564(b)(1) of the Act, 21 U.S.C.section 360bbb-3(b)(1), unless the authorization is terminated  or revoked sooner.       Influenza A by PCR 07/29/2022  NEGATIVE  NEGATIVE Final   Influenza B by PCR 07/29/2022 NEGATIVE  NEGATIVE Final   Comment: (NOTE) The Xpert Xpress SARS-CoV-2/FLU/RSV plus assay is intended as an aid in the diagnosis of influenza from Nasopharyngeal swab specimens and should not be used as a sole basis for treatment. Nasal washings and aspirates are unacceptable for Xpert Xpress SARS-CoV-2/FLU/RSV testing.  Fact Sheet for Patients: BloggerCourse.com  Fact Sheet for Healthcare Providers: SeriousBroker.it  This test is not yet approved or cleared by the [place] FDA and has been authorized for detection and/or diagnosis of SARS-CoV-2 by FDA under an Emergency Use Authorization (EUA). This EUA will remain in effect (meaning this test can be used) for the duration of the COVID-19 declaration under Section 564(b)(1) of the Act, 21 U.S.C. section 360bbb-3(b)(1), unless the authorization is terminated or revoked.     Resp Syncytial Virus by PCR 07/29/2022 NEGATIVE  NEGATIVE Final   Comment: (NOTE) Fact Sheet for Patients: BloggerCourse.com  Fact Sheet for Healthcare Providers: SeriousBroker.it  This test is not yet approved or cleared by the [place] FDA and has been authorized for detection and/or diagnosis of SARS-CoV-2 by FDA under an Emergency Use Authorization (EUA). This EUA will remain in effect (meaning this test can be used) for the duration of the COVID-19 declaration under Section 564(b)(1) of the Act, 21 U.S.C. section 360bbb-3(b)(1), unless the authorization is terminated or revoked.  Performed at [facility], [address], [place], [place] [address]   There may be more visits with results that are not included.    Allergies: Ascorbate, Citrus, Coconut flavor, Lamotrigine, Orange (diagnostic), Peach flavor, Pear, and Pineapple  Medications:  Facility Ordered  Medications  Medication   acetaminophen (TYLENOL) tablet 650 mg   alum & mag hydroxide-simeth (MAALOX/MYLANTA) 200-200-20 MG/5ML suspension 30 mL   magnesium hydroxide (MILK OF MAGNESIA) suspension 30 mL   hydrOXYzine (ATARAX) tablet 25 mg   traZODone (DESYREL) tablet 50 mg   PTA Medications  Medication Sig   nicotine (NICODERM CQ - DOSED IN MG/24 HOURS) 14 mg/24hr patch Place 1 patch (14 mg total) onto the skin daily. (May buy from over the counter): For smoking cessation.   nicotine polacrilex (NICORETTE) 2 MG gum Take 1 each (2 mg total) by mouth as needed for smoking cessation.   albuterol (VENTOLIN HFA) 108 (90 Base) MCG/ACT inhaler Inhale 1 puff into the lungs every 6 (six) hours as needed for wheezing or shortness of breath.   ARIPiprazole (ABILIFY) 5 MG tablet Take 1 tablet (5 mg total) by mouth at bedtime.   traZODone (DESYREL) 50 MG tablet Take 1 tablet (50 mg total) by mouth at bedtime as needed for sleep. Only refill 7 days at a time.   hydrOXYzine (ATARAX) 25 MG tablet Take 1 tablet (25 mg total) by mouth 3 (three) times daily as needed for anxiety. Only refill 7 days at a time.   naproxen (NAPROSYN) 500 MG tablet Take 1 tablet (500 mg total) by mouth every 12 (twelve) hours as needed for mild pain or moderate pain.      Medical Decision Making  Patient is criteria for inpatient psychiatric admission; she will be admitted to [facility] for continuous assessment while in waiting for inpatient psychiatric bed availability. -Admission labs -EKG -     Recommendations  Based on my evaluation the patient does not appear to have an emergency medical condition.  Maricela Bo, NP 12/20/22  5:40 AM

## 2022-12-19 NOTE — BH Assessment (Signed)
Comprehensive Clinical Assessment (CCA) Note  12/19/2022 Sharon Ward [id number]  Disposition: CCA completed alongside Sharon Asper, NP who completed MSE and recommends inpatient psychiatric admission. [facility] AC has been requested to review.  The patient demonstrates the following risk factors for suicide: Chronic risk factors for suicide include: psychiatric disorder of bipolar II, borderline personality, PTSD and social anxiety . Acute risk factors for suicide include: family or marital conflict. Protective factors for this patient include: responsibility to others (children, family) and hope for the future. Considering these factors, the overall suicide risk at this point appears to be high. Patient is not appropriate for outpatient follow up.  Sharon Ward is a 27 year old single female who presents voluntarily to [facility] due to recent manic episodes, HI, and suicidal ideations, with a plan to overdose on medications. Patient reports a diagnosis of bipolar II, borderline personality, depression, PTSD, and social anxiety. Patient states she has been having manic episodes for the past three weeks, which consists of trying to hurt and kill herself. Patient says she had one last night while at the father Sharon Ward [phone]) of her children's home. Patient states she had to be restrained by him. Patient endorses depressive symptoms to include decreased energy, sleeping all day, hopelessness, isolation and feeling worthless. Patient reports at least ten suicide attempts in the past by overdose. Patient states she has been having homicidal ideations today with no plan or identified person. Patient denies any auditory or visual hallucinations. Patient reports she has been drinking alcohol and liquor. She says she drank three beers today. Patient reports she can drink a bottle of vodka in one night. Patient states she has also done [place] 9, as recent as Thursday. Patient denies access to  guns or weapons.   When asked about current stressors, patient stated "I have a lot going on and counseling is not helping". Patient declined to talk about the stressors. Patient lives alone. Patient reports she has 20-year-old and 66-month-old daughters who are in DSS custody and being put up for adoption. Patient did not elaborate on the reasoning behind not having custody of her children. Patient has been receiving disability since she was a child due to her mental health. Patient states she does not have supports. Patient denies current legal problems.  Patient states she has an ACTT team with Sharon Ward and a therapist with [facility]. Patient reports she saw her therapist two weeks ago and her ACTT team last week. Patient is prescribed Trazadone, Abilify and Hydroxyzine, however patient says she does not take the medications consistently. She was unable to give an explanation on why she doesn't take the medications. Patient was hospitalized at [facility] October 18, 2022, thru March 14. Patient has more than ten inpatient hospitalizations over the past couple of years.  Patient is dressed casually, alert, and oriented x4 wit soft speech. Patient has a flat affect and depressed mood. Patient makes good eye contact and there is no indication she is responding to internal stimuli. Patient's thought process is coherent, and she was cooperative throughout the assessment. Patient can not contract for safety. Patient requested the father of her children to be contacted for collateral.   Sharon Ward was contacted to obtain collateral, as requested by patient. Sharon Ward reports patient has a lot going on with her family. He says patient has been manic and saying she cannot remember what happened after the episodes. Sharon Ward says he had to stop patient from cutting herself last night. He does not  feel patient can be trust alone, due to her history of suicide attempts.   Chief Complaint:  Chief Complaint   Patient presents with   Suicidal ideation   Manic Behavior   Visit Diagnosis: Bipolar II Borderline personality disorder   CCA Screening, Triage and Referral (STR)  Patient Reported Information How did you hear about [place]? Self  What Is the Reason for Your Visit/Call Today? Pt presents to [facility] voluntarily, unaccompanied at this time due to manic episode, suicidal ideation with a plan to overdose on medications and HI (no plan or intent). Pt reports that she had a manic episode last night that resulted in her destroying items in the home, extremely agitated and drinking more heavily. Pt reports diagnosis of Bipolar II and Borderline Personality Disorder and is currently prescribed Trazadone, Abilify, and Hydroxyzine. Pt reports she is non-compliant with her medications and her boyfriend did give her a dose this morning because he keeps them put away. Pt is followed by [facility] and also receives ACTT team services through [facility]. Pt currently denies AVH.  How Long Has This Been Causing You Problems? <Week  What Do You Feel Would Help You the Most Today? Treatment for Depression or other mood problem; Alcohol or Drug Use Treatment   Have You Recently Had Any Thoughts About Hurting Yourself? Yes  Are You Planning to Commit Suicide/Harm Yourself At This time? Yes   Flowsheet Row ED from 12/19/2022 in [facility] ED from 12/02/2022 in [facility] Department at [facility] (Discharged[facility]  C-SSRS RISK CATEGORY High Risk No Risk No Risk       Have you Recently Had Thoughts About Hurting Someone Sharon Ward? Yes  Are You Planning to Harm Someone at This Time? No  Explanation: Patient has no plan or identified person.   Have You Used Any Alcohol or Drugs in the Past 24 Hours? Yes  What Did You Use and How Much? Patient reprots drinking 3-4 beers.   Do  You Currently Have a Therapist/Psychiatrist? Yes  Name of Therapist/Psychiatrist:    Have You Been Recently Discharged From Any Office Practice or Programs? Yes  Explanation of Discharge From Practice/Program: [facility] was discharged 09/03/22     CCA Screening Triage Referral Assessment Type of Contact: Face-to-Face  Telemedicine Service Delivery:   Is this Initial or Reassessment?   Date Telepsych consult ordered in [facility]:    Time Telepsych consult ordered in [facility]:    Location of Assessment: [facility] Assessment Services  Provider Location: [facility] Assessment Services   Collateral Involvement: none reported   Does Patient Have a Automotive engineer Guardian? No  Legal Guardian Contact Information: No guardian.  Copy of Legal Guardianship Form: -- (Pt does not have a guardian.)  Legal Guardian Notified of Arrival: -- (Pt does not have a guardian.)  Legal Guardian Notified of Pending Discharge: -- (Pt does not have a guardian.)  If Minor and Not Living with Parent(s), Who has Custody? Pt is an adult  Is CPS involved or ever been involved? In the Past (Both of her children are in DSS custody.)  Is APS involved or ever been involved? Never   Patient Determined To Be At Risk for Harm To Self or Others Based on Review of Patient Reported Information or Presenting Complaint? Yes, for Self-Harm  Method: No Plan (Pt has had previous attempts.)  Availability of Means: No access or  NA  Intent: Vague intent or NA  Notification Required: No need or identified person  Additional Information for Danger to Others Potential: Previous attempts  Additional Comments for Danger to Others Potential: none reported  Are There Guns or Other Weapons in Your Home? No  Types of Guns/Weapons: None reported  Are These Weapons Safely Secured?                            -- (No weapon to safely secure)  Who Could Verify You Are Able To Have These Secured: No guns in the home.  Do You Have  any Outstanding Charges, Pending Court Dates, Parole/Probation? None reported  Contacted To Inform of Risk of Harm To Self or Others: Other: Comment (No others to report)    Does Patient Present under Involuntary Commitment? No    [place] of Residence: [place]   Patient Currently Receiving the Following Services: ACTT Psychologist, educational) ([facility])   Determination of Need: Emergent (2 hours)   Options For Referral: Other: Comment; BH Urgent Care; Outpatient Therapy; Medication Management; Inpatient Hospitalization     CCA Biopsychosocial Patient Reported Schizophrenia/Schizoaffective Diagnosis in Past: No   Strengths: Pt cannot think of any strengths at this time.   Mental Health Symptoms Depression:   Hopelessness; Worthlessness; Change in energy/activity; Tearfulness; Fatigue; Increase/decrease in appetite; Weight gain/loss   Duration of Depressive symptoms:    Mania:   Change in energy/activity; Racing thoughts   Anxiety:    Irritability; Sleep; Tension; Worrying (Pt has had multiple panic attacks over the last few days.)   Psychosis:   None   Duration of Psychotic symptoms:    Trauma:   Difficulty staying/falling asleep; Irritability/anger; Avoids reminders of event   Obsessions:   None   Compulsions:   None   Inattention:   None   Hyperactivity/Impulsivity:   None   Oppositional/Defiant Behaviors:   None   Emotional Irregularity:   Mood lability; Potentially harmful impulsivity; Recurrent suicidal behaviors/gestures/threats; Intense/unstable relationships; Intense/inappropriate anger   Other Mood/Personality Symptoms:   None noted    Mental Status Exam Appearance and self-care  Stature:   Average   Weight:   Average weight   Clothing:   Casual   Grooming:   Normal   Cosmetic use:   Age appropriate   Posture/gait:   Normal   Motor activity:   Not Remarkable   Sensorium  Attention:   Normal   Concentration:    Normal   Orientation:   X5   Recall/memory:   Normal   Affect and Mood  Affect:   Anxious; Congruent; Tearful; Depressed   Mood:   Depressed; Anxious; Worthless   Relating  Eye contact:   Normal   Facial expression:   Depressed; Anxious; Sad   Attitude toward examiner:   Cooperative   Thought and Language  Speech flow:  Normal   Thought content:   Appropriate to Mood and Circumstances   Preoccupation:   None   Hallucinations:   None   Organization:   Coherent; Engineer, site of Knowledge:   Average   Intelligence:   Average   Abstraction:   Normal   Judgement:   Poor   Reality Testing:   Adequate   Insight:   Lacking   Decision Making:   Impulsive   Social Functioning  Social Maturity:   Impulsive   Social Judgement:   Normal   Stress  Stressors:  Family conflict; Relationship; Financial   Coping Ability:   Overwhelmed   Skill Deficits:   Decision making; Self-control; Activities of daily living   Supports:   Support needed; Friends/Service system     Religion:    Leisure/Recreation:    Exercise/Diet:     CCA Employment/Education Employment/Work Situation:    Education:     CCA Family/Childhood History Family and Relationship History:    Childhood History:          CCA Substance Use Alcohol/Drug Use:                           ASAM's:  Six Dimensions of Multidimensional Assessment  Dimension 1:  Acute Intoxication and/or Withdrawal Potential:      Dimension 2:  Biomedical Conditions and Complications:      Dimension 3:  Emotional, Behavioral, or Cognitive Conditions and Complications:     Dimension 4:  Readiness to Change:     Dimension 5:  Relapse, Continued use, or Continued Problem Potential:     Dimension 6:  Recovery/Living Environment:     ASAM Severity Score:    ASAM Recommended Level of Treatment:     Substance use Disorder (SUD)     Recommendations for Services/Supports/Treatments:    Discharge Disposition:    DSM5 Diagnoses: Patient Active Problem List   Diagnosis Date Noted   Insomnia 10/18/2022   Bipolar 1 disorder, depressed (HCC) 08/30/2022   MDD (major depressive disorder) 07/30/2022   Suicidal ideation 07/29/2022   UTI (urinary tract infection) 07/15/2022   GBS bacteriuria 11/09/2021   Bipolar disorder, rapid cycling (HCC) 11/09/2021   GAD (generalized anxiety disorder) 09/24/2021   Marijuana abuse 05/05/2021   Bipolar 2 disorder, major depressive episode (HCC) 02/05/2021   Overdose 11/03/2020   Prolonged QT interval 11/02/2020   Asthma 09/18/2019   LGSIL on Pap smear of cervix 09/18/2019   Oppositional defiant disorder 09/18/2019   History of dysphagia 02/01/2018   Chronic constipation 04/25/2017   Borderline personality disorder (HCC) 10/24/2016   PTSD (post-traumatic stress disorder) 10/24/2016   Gastroesophageal reflux disease without esophagitis 10/24/2016   Mild intermittent asthma without complication 10/24/2016     Referrals to Alternative Service(s): Referred to Alternative Service(s):   Place:   Date:   Time:    Referred to Alternative Service(s):   Place:   Date:   Time:    Referred to Alternative Service(s):   Place:   Date:   Time:    Referred to Alternative Service(s):   Place:   Date:   Time:     Cleda Clarks, LCSW

## 2022-12-19 NOTE — ED Notes (Signed)
Pt admitted to unit for obs. She endorses SI but contracts for safety. Denies HI/AVH. She is cooperative, flat affect, sad and guarded. PRN meds given per order. No noted distress. Will continue to monitor for safety

## 2022-12-20 ENCOUNTER — Other Ambulatory Visit: Payer: Self-pay

## 2022-12-20 ENCOUNTER — Inpatient Hospital Stay (HOSPITAL_COMMUNITY)
Admission: AD | Admit: 2022-12-20 | Discharge: 2022-12-24 | DRG: 885 | Disposition: A | Discharge status: Home / Self Care | Payer: Medicaid Other | Source: Intra-hospital | Attending: Psychiatry | Admitting: Psychiatry

## 2022-12-20 ENCOUNTER — Encounter (HOSPITAL_COMMUNITY): Payer: Self-pay | Admitting: Urology

## 2022-12-20 DIAGNOSIS — F3181 Bipolar II disorder: Secondary | ICD-10-CM | POA: Diagnosis present

## 2022-12-20 DIAGNOSIS — Z9151 Personal history of suicidal behavior: Secondary | ICD-10-CM

## 2022-12-20 DIAGNOSIS — R45850 Homicidal ideations: Secondary | ICD-10-CM | POA: Diagnosis present

## 2022-12-20 DIAGNOSIS — F4310 Post-traumatic stress disorder, unspecified: Secondary | ICD-10-CM | POA: Diagnosis present

## 2022-12-20 DIAGNOSIS — Z818 Family history of other mental and behavioral disorders: Secondary | ICD-10-CM | POA: Diagnosis not present

## 2022-12-20 DIAGNOSIS — F17210 Nicotine dependence, cigarettes, uncomplicated: Secondary | ICD-10-CM | POA: Diagnosis present

## 2022-12-20 DIAGNOSIS — F1210 Cannabis abuse, uncomplicated: Secondary | ICD-10-CM | POA: Diagnosis present

## 2022-12-20 DIAGNOSIS — J45909 Unspecified asthma, uncomplicated: Secondary | ICD-10-CM | POA: Diagnosis present

## 2022-12-20 DIAGNOSIS — F1010 Alcohol abuse, uncomplicated: Secondary | ICD-10-CM | POA: Diagnosis present

## 2022-12-20 DIAGNOSIS — F1290 Cannabis use, unspecified, uncomplicated: Secondary | ICD-10-CM | POA: Diagnosis present

## 2022-12-20 DIAGNOSIS — F411 Generalized anxiety disorder: Secondary | ICD-10-CM | POA: Diagnosis present

## 2022-12-20 DIAGNOSIS — Z5986 Financial insecurity: Secondary | ICD-10-CM | POA: Diagnosis not present

## 2022-12-20 DIAGNOSIS — R45851 Suicidal ideations: Secondary | ICD-10-CM | POA: Diagnosis present

## 2022-12-20 DIAGNOSIS — F603 Borderline personality disorder: Secondary | ICD-10-CM | POA: Diagnosis present

## 2022-12-20 DIAGNOSIS — F4312 Post-traumatic stress disorder, chronic: Secondary | ICD-10-CM | POA: Diagnosis present

## 2022-12-20 LAB — COMPREHENSIVE METABOLIC PANEL
ALT: 18 U/L (ref 0–44)
AST: 17 U/L (ref 15–41)
Albumin: 4.4 g/dL (ref 3.5–5.0)
Alkaline Phosphatase: 66 U/L (ref 38–126)
Anion gap: 11 (ref 5–15)
BUN: 12 mg/dL (ref 6–20)
CO2: 26 mmol/L (ref 22–32)
Calcium: 9.8 mg/dL (ref 8.9–10.3)
Chloride: 102 mmol/L (ref 98–111)
Creatinine, Ser: 0.85 mg/dL (ref 0.44–1.00)
GFR, Estimated: >60 mL/min (ref >60)
Glucose, Bld: 85 mg/dL (ref 70–99)
Potassium: 4 mmol/L (ref 3.5–5.1)
Sodium: 139 mmol/L (ref 135–145)
Total Bilirubin: 0.4 mg/dL (ref 0.3–1.2)
Total Protein: 7.4 g/dL (ref 6.5–8.1)

## 2022-12-20 LAB — CBC WITH DIFFERENTIAL/PLATELET
Abs Immature Granulocytes: 0.02 10*3/uL (ref 0.00–0.07)
Basophils Absolute: 0.1 10*3/uL (ref 0.0–0.1)
Basophils Relative: 1 %
Eosinophils Absolute: 0.2 10*3/uL (ref 0.0–0.5)
Eosinophils Relative: 2 %
HCT: 41.9 % (ref 36.0–46.0)
Hemoglobin: 13.7 g/dL (ref 12.0–15.0)
Immature Granulocytes: 0 %
Lymphocytes Relative: 40 %
Lymphs Abs: 3.1 10*3/uL (ref 0.7–4.0)
MCH: 29 pg (ref 26.0–34.0)
MCHC: 32.7 g/dL (ref 30.0–36.0)
MCV: 88.8 fL (ref 80.0–100.0)
Monocytes Absolute: 0.6 10*3/uL (ref 0.1–1.0)
Monocytes Relative: 8 %
Neutro Abs: 3.8 10*3/uL (ref 1.7–7.7)
Neutrophils Relative %: 49 %
Platelets: 313 10*3/uL (ref 150–400)
RBC: 4.72 MIL/uL (ref 3.87–5.11)
RDW: 12.7 % (ref 11.5–15.5)
WBC: 7.8 10*3/uL (ref 4.0–10.5)
nRBC: 0 % (ref 0.0–0.2)

## 2022-12-20 LAB — LIPID PANEL
Cholesterol: 190 mg/dL (ref 0–200)
HDL: 65 mg/dL (ref >40)
LDL Cholesterol: 107 mg/dL — ABNORMAL HIGH (ref 0–99)
Total CHOL/HDL Ratio: 2.9 RATIO
Triglycerides: 92 mg/dL (ref <150)
VLDL: 18 mg/dL (ref 0–40)

## 2022-12-20 LAB — ETHANOL: Alcohol, Ethyl (B): <10 mg/dL (ref <10)

## 2022-12-20 LAB — TSH: TSH: 3.133 u[IU]/mL (ref 0.350–4.500)

## 2022-12-20 MED ORDER — ACETAMINOPHEN 325 MG PO TABS: 650.0000 mg | ORAL_TABLET | Freq: Four times a day (QID) | ORAL | Status: DC | PRN

## 2022-12-20 MED ORDER — TRAZODONE HCL 50 MG PO TABS
50.0000 mg | ORAL_TABLET | Freq: Every evening | ORAL | Status: DC | PRN
  Administered 2022-12-20 – 2022-12-22 (×3): 50 mg via ORAL
  Filled 2022-12-20 (×3): qty 1

## 2022-12-20 MED ORDER — HYDROXYZINE HCL 25 MG PO TABS: 50.0000 mg | ORAL_TABLET | Freq: Once | ORAL | Status: DC

## 2022-12-20 MED ORDER — MAGNESIUM HYDROXIDE 400 MG/5ML PO SUSP: 30.0000 mL | Freq: Every day | ORAL | Status: DC | PRN

## 2022-12-20 MED ORDER — ALUM & MAG HYDROXIDE-SIMETH 200-200-20 MG/5ML PO SUSP: 30.0000 mL | ORAL | Status: DC | PRN

## 2022-12-20 MED ORDER — NICOTINE 14 MG/24HR TD PT24
14.0000 mg | MEDICATED_PATCH | Freq: Every day | TRANSDERMAL | Status: DC
  Filled 2022-12-20 (×3): qty 1

## 2022-12-20 MED ORDER — HYDROXYZINE HCL 25 MG PO TABS
25.0000 mg | ORAL_TABLET | Freq: Three times a day (TID) | ORAL | Status: DC | PRN
  Administered 2022-12-20 – 2022-12-22 (×2): 25 mg via ORAL
  Filled 2022-12-20 (×2): qty 1

## 2022-12-20 NOTE — Discharge Instructions (Addendum)
Transfer to [facility] for inpatient psychiatric admission.  Dr. Sherron Flemings is the accepting MD
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
## 2022-12-20 NOTE — ED Provider Notes (Signed)
FBC/OBS ASAP Discharge Summary  Date and Time: 12/20/2022 9:58 AM  Name: Sharon Ward  MRN:  [id number]   Discharge Diagnoses:  Final diagnoses:  Bipolar II disorder [facility])  HPI: Sharon Ward, 27 year old female patient initially presented to [facility] on 12/19/2022 with reported manic episodes and suicidal ideations with a plan to overdose.  She was admitted to the continuous assessment unit and recommended for inpatient psychiatric admission.  Sharon Ward, 27 y.o., female patient seen face to face by this provider, consulted with Dr. Lucianne Muss; and chart reviewed on 12/20/22.  Per chart review patient has a past psychiatric history of bipolar 2, borderline personality disorder, depression, PTSD, and social anxiety.  Patient has counseling services through [facility] and services with [facility] ACT team.  Patient reports noncompliance with her medications.  She is prescribed Abilify 5 mg a day and hydroxyzine 25 mg 3 times daily as needed and trazodone 50 mg nightly as needed.  UDS on admission is positive for marijuana and EtOH is less than 10.  Subjective:   During evaluation Sharon Ward is observed laying in her bed asleep.  She is easily awakened.  She is alert/oriented x 4, cooperative, and attentive.  She has normal speech and behavior.  She continues to endorse depression and has a flat affect.  She continues to endorse suicidal ideations with a plan to overdose and she cannot contract for safety.  When asked if she is feeling homicidal she states, "not today".  Patient endorses manic episodes and when asked to describe what she states she has been extremely irritable over the past 3 weeks and impulsive.  She denies auditory/visual hallucinations.  She does not appear to be responding to internal/external stimuli.  She is able to answer questions appropriately.  She denies any alcohol withdrawal symptoms at this time.  Stay Summary: Patient remained calm and cooperative while on the  unit.  She was appropriate with staff and other patients.  She was compliant with medications.  She required no as needed medications for agitation while on the unit.  She continues to meet criteria for inpatient psychiatric admission.  [facility] notified and patient has been accepted.  Total Time spent with patient: 20 minutes  Past Psychiatric History: as documented in H&P Past Medical History: as documented in H&P Family History: as documented in H&P Family Psychiatric History: as documented in H&P Social History: as documented in H&P Tobacco Cessation:  Prescription not provided because: pt being transferred to [facility] for IP admission  Current Medications:  No current facility-administered medications for this encounter.   Current Outpatient Medications  Medication Sig Dispense Refill   ARIPiprazole (ABILIFY) 10 MG tablet Take 10 mg by mouth at bedtime.     doxycycline (VIBRAMYCIN) 100 MG capsule Take 100 mg by mouth 2 (two) times daily. Take for 10 days starting on 12/11/22.     hydrOXYzine (ATARAX) 25 MG tablet Take 1 tablet (25 mg total) by mouth 3 (three) times daily as needed for anxiety. Only refill 7 days at a time. 21 tablet 6   traZODone (DESYREL) 50 MG tablet Take 1 tablet (50 mg total) by mouth at bedtime as needed for sleep. Only refill 7 days at a time. (Patient taking differently: Take 50 mg by mouth at bedtime. Only refill 7 days at a time.) 7 tablet 6   albuterol (VENTOLIN HFA) 108 (90 Base) MCG/ACT inhaler Inhale 1 puff into the lungs every 6 (six) hours as needed for wheezing or  shortness of breath.      PTA Medications:  PTA Medications  Medication Sig   traZODone (DESYREL) 50 MG tablet Take 1 tablet (50 mg total) by mouth at bedtime as needed for sleep. Only refill 7 days at a time. (Patient taking differently: Take 50 mg by mouth at bedtime. Only refill 7 days at a time.)   hydrOXYzine (ATARAX) 25 MG tablet Take 1 tablet (25 mg total) by mouth 3 (three) times daily  as needed for anxiety. Only refill 7 days at a time.   ARIPiprazole (ABILIFY) 10 MG tablet Take 10 mg by mouth at bedtime.   doxycycline (VIBRAMYCIN) 100 MG capsule Take 100 mg by mouth 2 (two) times daily. Take for 10 days starting on 12/11/22.   albuterol (VENTOLIN HFA) 108 (90 Base) MCG/ACT inhaler Inhale 1 puff into the lungs every 6 (six) hours as needed for wheezing or shortness of breath.       09/16/2022    4:15 PM 08/21/2022   12:43 AM 08/18/2022    3:46 PM  Depression screen PHQ 2/9  Decreased Interest 3 2 2   Down, Depressed, Hopeless 3 2 2   PHQ - 2 Score 6 4 4   Altered sleeping 3 1 3   Tired, decreased energy 3 1 2   Change in appetite 3 1 3   Feeling bad or failure about yourself  3 1 3   Trouble concentrating 3 1 0  Moving slowly or fidgety/restless 0 0 0  Suicidal thoughts 0 1 0  PHQ-9 Score 21 10 15   Difficult doing work/chores  Very difficult     Flowsheet Row ED from 12/19/2022 in [facility] ED from 12/02/2022 in [facility] at [facility] Admission (Discharged[facility]  C-SSRS RISK CATEGORY High Risk No Risk No Risk       Musculoskeletal  Strength & Muscle Tone: within normal limits Gait & Station: normal Patient leans: N/A  Psychiatric Specialty Exam  Presentation  General Appearance:  Appropriate for Environment  Eye Contact: Good  Speech: Clear and Coherent  Speech Volume: Normal  Handedness: Right   Mood and Affect  Mood: Depressed  Affect: Congruent   Thought Process  Thought Processes: Coherent  Descriptions of Associations:Intact  Orientation:Full (Time, Place and Person)  Thought Content:WDL  Diagnosis of Schizophrenia or Schizoaffective disorder in past: No    Hallucinations:Hallucinations: None  Ideas of Reference:None  Suicidal Thoughts:Suicidal Thoughts: Yes, Active SI Active Intent and/or Plan: With Plan;  With Intent  Homicidal Thoughts:Homicidal Thoughts: Yes, Active HI Active Intent and/or Plan: Without Plan; Without Intent   Sensorium  Memory: Immediate Good; Recent Good; Remote Fair  Judgment: Fair  Insight: Fair   Art therapist  Concentration: Fair  Attention Span: Fair  Recall: Fair  Fund of Knowledge: Good  Language: Good   Psychomotor Activity  Psychomotor Activity: Psychomotor Activity: Normal   Assets  Assets: Desire for Improvement; Communication Skills; Housing; Social Support   Sleep  Sleep: Sleep: Good Number of Hours of Sleep: 18   Nutritional Assessment (For OBS and FBC admissions only) Has the patient had a weight loss or gain of 10 pounds or more in the last 3 months?: No Has the patient had a decrease in food intake/or appetite?: Yes Does the patient have dental problems?: No Does the patient have eating habits or behaviors that may be indicators of an eating disorder including binging or inducing vomiting?: No Has the patient recently  lost weight without trying?: 0 Has the patient been eating poorly because of a decreased appetite?: 0 Malnutrition Screening Tool Score: 0    Physical Exam  Physical Exam Vitals and nursing note reviewed.  Constitutional:      General: She is not in acute distress.    Appearance: Normal appearance. She is not ill-appearing.  HENT:     Head: Normocephalic.  Eyes:     General:        Right eye: No discharge.        Left eye: No discharge.  Cardiovascular:     Rate and Rhythm: Normal rate.  Pulmonary:     Effort: Pulmonary effort is normal.  Musculoskeletal:        General: Normal range of motion.     Cervical back: Normal range of motion.  Skin:    Coloration: Skin is not jaundiced or pale.  Neurological:     Mental Status: She is alert and oriented to person, place, and time.  Psychiatric:        Attention and Perception: Attention and perception normal.        Mood and Affect:  Affect normal. Mood is depressed.        Speech: Speech normal.        Behavior: Behavior normal. Behavior is cooperative.        Thought Content: Thought content includes suicidal ideation. Thought content includes suicidal plan.        Cognition and Memory: Cognition normal.        Judgment: Judgment is impulsive.    Review of Systems  Constitutional: Negative.   HENT: Negative.    Eyes: Negative.   Respiratory: Negative.    Cardiovascular: Negative.   Musculoskeletal: Negative.   Skin: Negative.   Neurological: Negative.   Psychiatric/Behavioral:  Positive for depression and suicidal ideas.    Blood pressure (!) 99/42, pulse 61, temperature 98.2 F (36.8 C), temperature source Oral, resp. rate 18, SpO2 100 %, not currently breastfeeding. There is no height or weight on file to calculate BMI.   Disposition:   Discharge patient and readmit patient to [facility] for inpatient admission.  Ardis Hughs, NP 12/20/2022, 9:58 AM

## 2022-12-20 NOTE — ED Notes (Signed)
Report called to Lurena Joiner RN at [facility] and safe transport called.

## 2022-12-20 NOTE — ED Notes (Signed)
Pt observed/assessed  sleeping. RR even and unlabored, appearing in no noted distress. Environmental check complete, will continue to monitor for safety 
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
## 2022-12-20 NOTE — Progress Notes (Signed)
Sharon Ward, Chaplain Spiritual care group on Spiritual Resources facilitated by [employer], [facility] and Chaplain Gayla Medicus  Group Goal: Support / Education around Sharon Ward - Strength  Members engage in facilitated group support and psycho-social education.  Group Description:  Following introductions and group rules, group members engaged in facilitated group dialogue and support around the spiritual resource strength.Group members engaged in an activity of identifying their strengths and sharing experiences of how they have used them. Participants also shared what helps them when they don't feel strong and who/what helps them in those challenging circumstances. Facilitators normalize experiences and assisted in helping participants identify ways to tap into their strengths. Group encouraged individual reflection on gaining strength and using their resources to gain other perspectives on what having strength means.   Group drew on Adlerian / Rogerian and narrative framework  Patient Progress: Patient attended group but did not participate.    Chaplain Gayla Medicus

## 2022-12-20 NOTE — Progress Notes (Signed)
Agitation protocol was not ordered for this patient due to patient having a history of QT prolongation.  Most recent QT interval was 506.

## 2022-12-20 NOTE — Progress Notes (Signed)
Pt was accepted to [facility] TODAY 12/20/2022. Bed assignment: 402-1  Pt meets inpatient criteria per Vernard Gambles, NP  Attending Physician will be Phineas Inches, MD  Report can be called to: - Adult unit: [phone]  Care Team Notified: [facility] Rona Ravens, RN and Vernard Gambles, NP  [place], [place]  12/20/2022 9:28 AM

## 2022-12-20 NOTE — ED Notes (Signed)
Discharged via Safe transport to [facility]. Belongings and AVS with patient.

## 2022-12-20 NOTE — Progress Notes (Signed)
Adult Psychoeducational Group Note  Date:  12/20/2022 Time:  11:49 PM  Group Topic/Focus:  Wrap-Up Group:   The focus of this group is to help patients review their daily goal of treatment and discuss progress on daily workbooks.  Participation Level:  Active  Participation Quality:  Appropriate  Affect:  Appropriate  Cognitive:  Appropriate  Insight: Appropriate  Engagement in Group:  Engaged  Modes of Intervention:  Education  Additional Comments:  Pt did attend group and actively participated.  Wynema Birch D 12/20/2022, 11:49 PM

## 2022-12-21 DIAGNOSIS — F4310 Post-traumatic stress disorder, unspecified: Secondary | ICD-10-CM

## 2022-12-21 DIAGNOSIS — F1210 Cannabis abuse, uncomplicated: Secondary | ICD-10-CM

## 2022-12-21 DIAGNOSIS — F603 Borderline personality disorder: Secondary | ICD-10-CM

## 2022-12-21 DIAGNOSIS — F411 Generalized anxiety disorder: Secondary | ICD-10-CM

## 2022-12-21 MED ORDER — DOXYCYCLINE HYCLATE 100 MG PO TABS
100.0000 mg | ORAL_TABLET | Freq: Two times a day (BID) | ORAL | Status: DC
  Administered 2022-12-22 – 2022-12-24 (×5): 100 mg via ORAL
  Filled 2022-12-21 (×8): qty 1

## 2022-12-21 MED ORDER — ZIPRASIDONE MESYLATE 20 MG IM SOLR: 20.0000 mg | INTRAMUSCULAR | Status: DC | PRN

## 2022-12-21 MED ORDER — LOPERAMIDE HCL 2 MG PO CAPS: 2.0000 mg | ORAL_CAPSULE | ORAL | Status: DC | PRN

## 2022-12-21 MED ORDER — ARIPIPRAZOLE 5 MG PO TABS
5.0000 mg | ORAL_TABLET | Freq: Every day | ORAL | Status: DC
  Administered 2022-12-21 – 2022-12-23 (×3): 5 mg via ORAL
  Filled 2022-12-21 (×5): qty 1

## 2022-12-21 MED ORDER — THIAMINE HCL 100 MG/ML IJ SOLN: 100.0000 mg | Freq: Once | INTRAMUSCULAR | Status: DC

## 2022-12-21 MED ORDER — LORAZEPAM 1 MG PO TABS: 1.0000 mg | ORAL_TABLET | Freq: Four times a day (QID) | ORAL | Status: DC | PRN

## 2022-12-21 MED ORDER — LORAZEPAM 1 MG PO TABS: 1.0000 mg | ORAL_TABLET | ORAL | Status: DC | PRN

## 2022-12-21 MED ORDER — RISPERIDONE 2 MG PO TBDP: 2.0000 mg | ORAL_TABLET | Freq: Three times a day (TID) | ORAL | Status: DC | PRN

## 2022-12-21 MED ORDER — ONDANSETRON 4 MG PO TBDP: 4.0000 mg | ORAL_TABLET | Freq: Four times a day (QID) | ORAL | Status: DC | PRN

## 2022-12-21 MED ORDER — LORAZEPAM 1 MG PO TABS
1.0000 mg | ORAL_TABLET | Freq: Four times a day (QID) | ORAL | Status: DC | PRN
Start: 2022-12-21 — End: 2022-12-21

## 2022-12-21 MED ORDER — NICOTINE POLACRILEX 2 MG MT GUM
2.0000 mg | CHEWING_GUM | OROMUCOSAL | Status: DC | PRN
  Administered 2022-12-21 – 2022-12-24 (×5): 2 mg via ORAL
  Filled 2022-12-21: qty 1

## 2022-12-21 NOTE — Progress Notes (Signed)
Adult Psychoeducational Group Note  Date:  12/21/2022 Time:  8:49 PM  Group Topic/Focus:  Wrap-Up Group:   The focus of this group is to help patients review their daily goal of treatment and discuss progress on daily workbooks.  Participation Level:  Active  Participation Quality:  Appropriate  Affect:  Appropriate  Cognitive:  Appropriate  Insight: Appropriate  Engagement in Group:  Engaged  Modes of Intervention:  Discussion  Additional Comments:  Sharon Ward said the color describe her overall day burgnady  [name] 12/21/2022, 8:49 PM

## 2022-12-21 NOTE — Progress Notes (Signed)
   12/21/22 1610  Psych Admission Type (Psych Patients Only)  Admission Status Voluntary  Psychosocial Assessment  Patient Complaints None  Eye Contact Fair  Facial Expression Anxious  Affect Sad;Anxious  Speech Logical/coherent  Interaction Assertive  Motor Activity Other (Comment) (WDL)  Appearance/Hygiene Unremarkable  Behavior Characteristics Cooperative  Mood Anxious  Thought Process  Coherency WDL  Content WDL  Delusions None reported or observed  Perception WDL  Hallucination None reported or observed  Judgment WDL  Confusion None  Danger to Self  Current suicidal ideation? Denies  Agreement Not to Harm Self Yes  Description of Agreement verbal  Danger to Others  Danger to Others None reported or observed   Pt denies SI/HI/AVH.

## 2022-12-21 NOTE — Progress Notes (Signed)
CIWA assessment refused

## 2022-12-21 NOTE — Group Note (Signed)
Date:  12/21/2022 Time:  11:10 AM  Group Topic/Focus:  Developing a Wellness Toolbox:   The focus of this group is to help patients develop a "wellness toolbox" with skills and strategies to promote recovery upon discharge.    Participation Level:  Active  Participation Quality:  Appropriate  Affect:  Appropriate  Cognitive:  Alert  Insight: Appropriate  Engagement in Group:  Engaged  Modes of Intervention:  Activity  Additional Comments:  Pt participated in Social Wellness group. Today's lesson was Communication and pt participated in Communication Activity with other group members.   Sharon Ward N Quadarius Henton 12/21/2022, 11:10 AM  

## 2022-12-21 NOTE — Group Note (Signed)
Date:  12/21/2022 Time:  10:00 AM  Group Topic/Focus:  Goals Group:   The focus of this group is to help patients establish daily goals to achieve during treatment and discuss how the patient can incorporate goal setting into their daily lives to aide in recovery.    Participation Level:  Active  Participation Quality:  Appropriate  Affect:  Appropriate  Cognitive:  Appropriate  Insight: Appropriate  Engagement in Group:  Engaged  Modes of Intervention:  Discussion  Additional Comments:  Pt stated that her goal for today is to talk to the doctors and get better with taking her medications again and to stay taking them them. Pt also stated that she wanted to stay away from alcohol when she discharges from here. Another goal was to learn how to communicate when things are bothering her.   Beckie Busing 12/21/2022, 10:00 AM

## 2022-12-21 NOTE — Group Note (Signed)
Recreation Therapy Group Note   Group Topic:Animal Assisted Therapy   Group Date: 12/21/2022 Start Time: 0945 End Time: 1030 Facilitators: Mekiah Wahler-McCall, LRT,CTRS Location: [address] Dayroom   Animal-Assisted Activity (AAA) Program Checklist/Progress Notes Patient Eligibility Criteria Checklist & Daily Group note for Rec Tx Intervention  AAA/T Program Assumption of Risk Form signed by Patient/ or Parent Legal Guardian Yes  Patient is free of allergies or severe asthma Yes  Patient reports no fear of animals Yes  Patient reports no history of cruelty to animals Yes  Patient understands his/her participation is voluntary Yes  Patient washes hands before animal contact Yes  Patient washes hands after animal contact Yes   Affect/Mood: Appropriate and Flat   Participation Level: Moderate   Participation Quality: Independent   Behavior: Attentive    Speech/Thought Process: Focused   Insight: Good   Judgement: Good   Modes of Intervention: Teaching laboratory technician   Patient Response to Interventions:  Attentive   Education Outcome:  Acknowledges education   Clinical Observations/Individualized Feedback:    Plan: Continue to engage patient in RT group sessions 2-3x/week.   Mckenzye Cutright-McCall, LRT,CTRS  12/21/2022 1:06 PM
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
## 2022-12-21 NOTE — Progress Notes (Signed)
Report given to Jane RN

## 2022-12-21 NOTE — [facility] Suicide Risk Assessment (Signed)
[facility] Admission Suicide Risk Assessment   Nursing information obtained from:  Patient Demographic factors:  Female Current Mental Status:  Suicidal ideation indicated by patient, Self-harm behaviors Loss Factors:  Loss of significant relationship Historical Factors:  Prior suicide attempts, Impulsivity Risk Reduction Factors:  Living with another person, especially a relative  Total Time spent with patient: 30 minutes Principal Problem: Bipolar 2 disorder, major depressive episode (HCC) Diagnosis:  Principal Problem:   Bipolar 2 disorder, major depressive episode (HCC) Active Problems:   Marijuana abuse   Borderline personality disorder (HCC)   PTSD (post-traumatic stress disorder)   GAD (generalized anxiety disorder)  Subjective Data: See H&P   Continued Clinical Symptoms:  Alcohol Use Disorder Identification Test Final Score (AUDIT): 10 The "Alcohol Use Disorders Identification Test", Guidelines for Use in Primary Care, Second Edition.  World Science writer [facility]). Score between 0-7:  no or low risk or alcohol related problems. Score between 8-15:  moderate risk of alcohol related problems. Score between 16-19:  high risk of alcohol related problems. Score 20 or above:  warrants further diagnostic evaluation for alcohol dependence and treatment.   CLINICAL FACTORS:   Severe Anxiety and/or Agitation Panic Attacks Bipolar Disorder:   Depressive phase Personality Disorders:   Cluster B More than one psychiatric diagnosis Unstable or Poor Therapeutic Relationship Previous Psychiatric Diagnoses and Treatments    Psychiatric Specialty Exam:  Presentation  General Appearance:  Casual  Eye Contact: Good  Speech: Normal Rate  Speech Volume: Normal  Handedness: Right   Mood and Affect  Mood: Depressed; Anxious  Affect: Restricted   Thought Process  Thought Processes: Linear  Descriptions of Associations:Intact  Orientation:Full (Time, Place and  Person)  Thought Content:Logical  History of Schizophrenia/Schizoaffective disorder:No  Duration of Psychotic Symptoms:N/A  Hallucinations:Hallucinations: None  Ideas of Reference:None  Suicidal Thoughts:Suicidal Thoughts: Yes, Passive SI Active Intent and/or Plan: Without Intent; Without Plan  Homicidal Thoughts:Homicidal Thoughts: No   Sensorium  Memory: Immediate Good; Recent Good; Remote Good  Judgment: Impaired  Insight: Lacking   Executive Functions  Concentration: Fair  Attention Span: Fair  Recall: Good  Fund of Knowledge: Good  Language: Good   Psychomotor Activity  Psychomotor Activity: Psychomotor Activity: Normal   Assets  Assets: Communication Skills; Desire for Improvement; Financial Resources/Insurance; Physical Health; Resilience; Social Support   Sleep  Sleep: Sleep: Good    Physical Exam:  Physical Exam See H&P  ROS See H&P  Blood pressure (!) 100/47, pulse 61, temperature 97.8 F (36.6 C), temperature source Oral, resp. rate 13, height 5\' 3"  (1.6 m), weight 98.4 kg, SpO2 98 %, not currently breastfeeding. Body mass index is 38.44 kg/m.   COGNITIVE FEATURES THAT CONTRIBUTE TO RISK:  None    SUICIDE RISK:   Moderate:  Frequent suicidal ideation with limited intensity, and duration, some specificity in terms of plans, no associated intent, good self-control, limited dysphoria/symptomatology, some risk factors present, and identifiable protective factors, including available and accessible social support.  PLAN OF CARE: See H&P   I certify that inpatient services furnished can reasonably be expected to improve the patient's condition.   Cristy Hilts, MD 12/21/2022, 5:24 PM

## 2022-12-21 NOTE — Progress Notes (Signed)
   12/20/22 2030  Psych Admission Type (Psych Patients Only)  Admission Status Voluntary  Psychosocial Assessment  Patient Complaints Sadness  Eye Contact Fair  Facial Expression Anxious  Affect Anxious;Sad  Speech Slow  Interaction Guarded  Motor Activity Slow  Appearance/Hygiene Poor hygiene  Behavior Characteristics Cooperative;Appropriate to situation  Mood Anxious;Depressed  Thought Process  Coherency WDL  Content Preoccupation  Delusions None reported or observed  Perception WDL  Hallucination None reported or observed  Judgment WDL  Danger to Self  Current suicidal ideation? Denies  Agreement Not to Harm Self Yes  Description of Agreement verbal

## 2022-12-22 ENCOUNTER — Encounter (HOSPITAL_COMMUNITY): Payer: Self-pay

## 2022-12-22 NOTE — Progress Notes (Signed)
Writer explain to [place] when group is going on Patients are supposed to be in their room or group and not the hallway sitting. Kennadi appeared to be very upset and angry. Georgiann said, "why am I the only one saying something about her sitting on the hallway." Writer explain I am the tech on the hall and this is why I have to remind the patients. RN notify

## 2022-12-22 NOTE — BH IP Treatment Plan (Signed)
Interdisciplinary Treatment and Diagnostic Plan Update  12/22/2022 Time of Session: 11:30 AM  Sharon Ward MRN: [id number]  Principal Diagnosis: Bipolar 2 disorder, major depressive episode (HCC)  Secondary Diagnoses: Principal Problem:   Bipolar 2 disorder, major depressive episode (HCC) Active Problems:   Marijuana abuse   Borderline personality disorder (HCC)   PTSD (post-traumatic stress disorder)   GAD (generalized anxiety disorder)   Current Medications:  Current Facility-Administered Medications  Medication Dose Route Frequency Provider Last Rate Last Admin   acetaminophen (TYLENOL) tablet 650 mg  650 mg Oral Q6H PRN Nwoko, Uchenna E, PA       alum & mag hydroxide-simeth (MAALOX/MYLANTA) 200-200-20 MG/5ML suspension 30 mL  30 mL Oral Q4H PRN Nwoko, Uchenna E, PA       ARIPiprazole (ABILIFY) tablet 5 mg  5 mg Oral QHS Massengill, Harrold Donath, MD   5 mg at 12/21/22 2143   doxycycline (VIBRA-TABS) tablet 100 mg  100 mg Oral BID WC Massengill, Harrold Donath, MD   100 mg at 12/22/22 0750   hydrOXYzine (ATARAX) tablet 25 mg  25 mg Oral TID PRN Karel Jarvis E, PA   25 mg at 12/20/22 2124   loperamide (IMODIUM) capsule 2-4 mg  2-4 mg Oral PRN Massengill, Harrold Donath, MD       LORazepam (ATIVAN) tablet 1 mg  1 mg Oral Q6H PRN Massengill, Nathan, MD       magnesium hydroxide (MILK OF MAGNESIA) suspension 30 mL  30 mL Oral Daily PRN Nwoko, Uchenna E, PA       nicotine polacrilex (NICORETTE) gum 2 mg  2 mg Oral PRN Massengill, Harrold Donath, MD   2 mg at 12/22/22 0813   ondansetron (ZOFRAN-ODT) disintegrating tablet 4 mg  4 mg Oral Q6H PRN Massengill, Harrold Donath, MD       risperiDONE (RISPERDAL M-TABS) disintegrating tablet 2 mg  2 mg Oral Q8H PRN Massengill, Nathan, MD       And   ziprasidone (GEODON) injection 20 mg  20 mg Intramuscular PRN Massengill, Harrold Donath, MD       traZODone (DESYREL) tablet 50 mg  50 mg Oral QHS PRN Nwoko, Uchenna E, PA   50 mg at 12/21/22 2143   PTA Medications: Medications Prior to  Admission  Medication Sig Dispense Refill Last Dose   ARIPiprazole (ABILIFY) 10 MG tablet Take 10 mg by mouth at bedtime.      doxycycline (VIBRAMYCIN) 100 MG capsule Take 100 mg by mouth 2 (two) times daily. Take for 10 days starting on 12/11/22.      hydrOXYzine (ATARAX) 25 MG tablet Take 1 tablet (25 mg total) by mouth 3 (three) times daily as needed for anxiety. Only refill 7 days at a time. 21 tablet 6    traZODone (DESYREL) 50 MG tablet Take 1 tablet (50 mg total) by mouth at bedtime as needed for sleep. Only refill 7 days at a time. (Patient taking differently: Take 50 mg by mouth at bedtime. Only refill 7 days at a time.) 7 tablet 6     Patient Stressors:    Patient Strengths:    Treatment Modalities: Medication Management, Group therapy, Case management,  1 to 1 session with clinician, Psychoeducation, Recreational therapy.   Physician Treatment Plan for Primary Diagnosis: Bipolar 2 disorder, major depressive episode (HCC) Long Term Goal(s): Improvement in symptoms so as ready for discharge   Short Term Goals: Ability to identify changes in lifestyle to reduce recurrence of condition will improve Ability to verbalize feelings will improve Ability to  disclose and discuss suicidal ideas Ability to demonstrate self-control will improve Ability to identify and develop effective coping behaviors will improve Ability to maintain clinical measurements within normal limits will improve Compliance with prescribed medications will improve Ability to identify triggers associated with substance abuse/mental health issues will improve  Medication Management: Evaluate patient's response, side effects, and tolerance of medication regimen.  Therapeutic Interventions: 1 to 1 sessions, Unit Group sessions and Medication administration.  Evaluation of Outcomes: Not Progressing  Physician Treatment Plan for Secondary Diagnosis: Principal Problem:   Bipolar 2 disorder, major depressive episode  (HCC) Active Problems:   Marijuana abuse   Borderline personality disorder (HCC)   PTSD (post-traumatic stress disorder)   GAD (generalized anxiety disorder)  Long Term Goal(s): Improvement in symptoms so as ready for discharge   Short Term Goals: Ability to identify changes in lifestyle to reduce recurrence of condition will improve Ability to verbalize feelings will improve Ability to disclose and discuss suicidal ideas Ability to demonstrate self-control will improve Ability to identify and develop effective coping behaviors will improve Ability to maintain clinical measurements within normal limits will improve Compliance with prescribed medications will improve Ability to identify triggers associated with substance abuse/mental health issues will improve     Medication Management: Evaluate patient's response, side effects, and tolerance of medication regimen.  Therapeutic Interventions: 1 to 1 sessions, Unit Group sessions and Medication administration.  Evaluation of Outcomes: Not Progressing   RN Treatment Plan for Primary Diagnosis: Bipolar 2 disorder, major depressive episode (HCC) Long Term Goal(s): Knowledge of disease and therapeutic regimen to maintain health will improve  Short Term Goals: Ability to remain free from injury will improve, Ability to verbalize frustration and anger appropriately will improve, Ability to demonstrate self-control, Ability to participate in decision making will improve, Ability to verbalize feelings will improve, Ability to disclose and discuss suicidal ideas, Ability to identify and develop effective coping behaviors will improve, and Compliance with prescribed medications will improve  Medication Management: RN will administer medications as ordered by provider, will assess and evaluate patient's response and provide education to patient for prescribed medication. RN will report any adverse and/or side effects to prescribing  provider.  Therapeutic Interventions: 1 on 1 counseling sessions, Psychoeducation, Medication administration, Evaluate responses to treatment, Monitor vital signs and CBGs as ordered, Perform/monitor CIWA, COWS, AIMS and Fall Risk screenings as ordered, Perform wound care treatments as ordered.  Evaluation of Outcomes: Not Progressing   LCSW Treatment Plan for Primary Diagnosis: Bipolar 2 disorder, major depressive episode (HCC) Long Term Goal(s): Safe transition to appropriate next level of care at discharge, Engage patient in therapeutic group addressing interpersonal concerns.  Short Term Goals: Engage patient in aftercare planning with referrals and resources, Increase social support, Increase ability to appropriately verbalize feelings, Increase emotional regulation, Facilitate acceptance of mental health diagnosis and concerns, Facilitate patient progression through stages of change regarding substance use diagnoses and concerns, Identify triggers associated with mental health/substance abuse issues, and Increase skills for wellness and recovery  Therapeutic Interventions: Assess for all discharge needs, 1 to 1 time with Social worker, Explore available resources and support systems, Assess for adequacy in community support network, Educate family and significant other(s) on suicide prevention, Complete Psychosocial Assessment, Interpersonal group therapy.  Evaluation of Outcomes: Not Progressing   Progress in Treatment: Attending groups: Yes. Participating in groups: Yes. Taking medication as prescribed: Yes. Toleration medication: Yes. Family/Significant other contact made: No, will contact:  [facility] team  Patient understands diagnosis: Yes.  Discussing patient identified problems/goals with staff: Yes. Medical problems stabilized or resolved: Yes. Denies suicidal/homicidal ideation: Yes. Issues/concerns per patient self-inventory: No.   New problem(s) identified: No,  Describe:  None Reported   New Short Term/Long Term Goal(s):detox, medication management for mood stabilization; elimination of SI thoughts; development of comprehensive mental wellness/sobriety plan  Patient Goals:  " Follow up with NA/AA classes, stay away from domestic violence and focus on my mental health, get back into hobbies I enjoy, stay on my medications, and reach out to [facility] for my mental health needs"   Discharge Plan or Barriers: Patient recently admitted. CSW will continue to follow and assess for appropriate referrals and possible discharge planning.    Reason for Continuation of Hospitalization: Anxiety Depression Homicidal ideation Medication stabilization Suicidal ideation Withdrawal symptoms  Estimated Length of Stay: 3-5 days   Last 3 [name] Suicide Severity Risk Score: Flowsheet Row Ad[facility] (Current) [facility] from 12[facility]19/2022 in [facility] ED from 12/02/2022 in [facility] Department [facility]  C-SSRS RISK CATEGORY High Risk High Risk No Risk       Last [facility] 2/9 Scores:    09/16/2022    4:15 PM 08/21/2022   12:43 AM 08/18/2022    3:46 PM  Depression screen PHQ 2/9  Decreased Interest 3 2 2   Down, Depressed, Hopeless 3 2 2   PHQ - 2 Score 6 4 4   Altered sleeping 3 1 3   Tired, decreased energy 3 1 2   Change in appetite 3 1 3   Feeling bad or failure about yourself  3 1 3   Trouble concentrating 3 1 0  Moving slowly or fidgety/restless 0 0 0  Suicidal thoughts 0 1 0  PHQ-9 Score 21 10 15   Difficult doing work/chores  Very difficult     Scribe for Treatment Team: Isabella Bowens, LCSWA 12/22/2022 11:50 AM

## 2022-12-22 NOTE — Progress Notes (Signed)
[facility] MD Progress Note  12/22/2022 6:39 PM Kadisha Wolfenbarger  MRN:  [id number]  Reason for admission: 27 year old female with a psychiatric history of bipolar disorder, depression, anxiety disorder, presented to the psychiatric unit for worsening depression, suicidal thoughts and explosive outburst where she expressed having homicidal thoughts. Prior to admission outpatient psychiatric medications: Patient reports stopping medications about 1 to 2 weeks ago "because I was feeling depressed" Before that she was taking Abilify ?mg  Daily notes: Maecy is seen, chart reviewed. The chart findings discussed with the treatment team. She presents alert, oriented & aware of situation. She presents with a bright affect, good eye contact & verbally responsive. She reports, "I came here voluntarily on Monday because I had a manic episodes that went on for two days. Then I became suicidal, so I took the bus to the hospital. I'm feeling better now that I have had time to reflect on stuff. I have also been able to talk to some people here, which is comforting to me. I have also restarted my medicines which I had stopped taking two weeks ago because I thought that I did not need them any more. I have been attending group sessions. I feel alright". Marda currently denies any SIHI, AVH, delusional thoughts or paranoia. She does not appear to be responding to any internal stimuli. Discussed this case with the attending psychiatrist. There are no changes made on the current plan of care. Will continue as already in progress. Reviewed vital signs, stable.  Principal Problem: Bipolar 2 disorder, major depressive episode (HCC)  Diagnosis: Principal Problem:   Bipolar 2 disorder, major depressive episode (HCC) Active Problems:   Marijuana abuse   Borderline personality disorder (HCC)   PTSD (post-traumatic stress disorder)   GAD (generalized anxiety disorder)  Total Time spent with patient:  35 minutes  Past Psychiatric  History: See H&P  Past Medical History:  Past Medical History:  Diagnosis Date   Asthma    Bipolar 1 disorder, mixed (HCC)    Depression    Generalized anxiety disorder    Intentional drug overdose (HCC) 11/03/2020   Low-lying placenta 01/07/2022   Resolved 03/04/22   Relationship dysfunction    Suicide attempt (HCC) 11/02/2020    Past Surgical History:  Procedure Laterality Date   PILONIDAL CYST / SINUS EXCISION  09/11/2013   PILONIDAL CYST EXCISION  05/17/2014   Pilonidal cystectomy with cleft lip   Family History:  Family History  Problem Relation Age of Onset   Asthma Mother    Diabetes Mother    Healthy Mother    Hypertension Father    Asthma Father    Diabetes Father    Healthy Father    Asthma Brother    Hypertension Paternal Uncle    Diabetes Paternal Grandmother    Stroke Paternal Grandfather    Heart disease Paternal Grandfather    Hypertension Paternal Grandfather    Diabetes Paternal Grandfather    Family Psychiatric  History: See H&P  Social History:  Social History   Substance and Sexual Activity  Alcohol Use Not Currently   Comment: occasional prior to pregnancy     Social History   Substance and Sexual Activity  Drug Use Not Currently   Frequency: 4.0 times per week   Types: Marijuana   Comment: No Delta 9 since February 2023    Social History   Socioeconomic History   Marital status: Single    Spouse name: Not on file   Number of children: 1  Years of education: 10   Highest education level: 10th grade  Occupational History   Not on file  Tobacco Use   Smoking status: Former    Packs/day: 1.00    Years: 15.00    Additional pack years: 0.00    Total pack years: 15.00    Types: Cigarettes    Quit date: 07/14/2021    Years since quitting: 1.4   Smokeless tobacco: Never   Tobacco comments:    only smoke a "couple" of cigarettes when stressed or anxious, socially with friends per [facility] chart  Vaping Use   Vaping Use: Former    Start date: 08/10/2003   Quit date: 05/04/2021  Substance and Sexual Activity   Alcohol use: Not Currently    Comment: occasional prior to pregnancy   Drug use: Not Currently    Frequency: 4.0 times per week    Types: Marijuana    Comment: No Delta 9 since February 2023   Sexual activity: Not Currently    Partners: Male    Birth control/protection: None  Other Topics Concern   Not on file  Social History Narrative   Not on file   Social Determinants of Health   Financial Resource Strain: Not on file  Food Insecurity: No Food Insecurity (12/20/2022)   Hunger Vital Sign    Worried About Running Out of Food in the Last Year: Never true    Ran Out of Food in the Last Year: Never true  Transportation Needs: No Transportation Needs (12/20/2022)   PRAPARE - Administrator, Civil Service (Medical): No    Lack of Transportation (Non-Medical): No  Physical Activity: Not on file  Stress: Not on file  Social Connections: Not on file   Additional Social History:   Sleep: Good  Appetite:  Good  Current Medications: Current Facility-Administered Medications  Medication Dose Route Frequency Provider Last Rate Last Admin   acetaminophen (TYLENOL) tablet 650 mg  650 mg Oral Q6H PRN Wise Fees, Uchenna E, PA       alum & mag hydroxide-simeth (MAALOX/MYLANTA) 200-200-20 MG/5ML suspension 30 mL  30 mL Oral Q4H PRN Onyekachi Gathright, Uchenna E, PA       ARIPiprazole (ABILIFY) tablet 5 mg  5 mg Oral QHS Massengill, Nathan, MD   5 mg at 12/21/22 2143   doxycycline (VIBRA-TABS) tablet 100 mg  100 mg Oral BID WC Massengill, Harrold Donath, MD   100 mg at 12/22/22 1759   hydrOXYzine (ATARAX) tablet 25 mg  25 mg Oral TID PRN Christle Nolting, Uchenna E, PA   25 mg at 12/20/22 2124   loperamide (IMODIUM) capsule 2-4 mg  2-4 mg Oral PRN Massengill, Harrold Donath, MD       LORazepam (ATIVAN) tablet 1 mg  1 mg Oral Q6H PRN Massengill, Nathan, MD       magnesium hydroxide (MILK OF MAGNESIA) suspension 30 mL  30 mL Oral Daily PRN Varvara Legault,  Uchenna E, PA       nicotine polacrilex (NICORETTE) gum 2 mg  2 mg Oral PRN Massengill, Harrold Donath, MD   2 mg at 12/22/22 0813   ondansetron (ZOFRAN-ODT) disintegrating tablet 4 mg  4 mg Oral Q6H PRN Massengill, Harrold Donath, MD       risperiDONE (RISPERDAL M-TABS) disintegrating tablet 2 mg  2 mg Oral Q8H PRN Massengill, Nathan, MD       And   ziprasidone (GEODON) injection 20 mg  20 mg Intramuscular PRN Massengill, Harrold Donath, MD       traZODone (DESYREL) tablet  50 mg  50 mg Oral QHS PRN Yury Schaus, Uchenna E, PA   50 mg at 12/21/22 2143    Lab Results: No results found for this or any previous visit (from the past 48 hour(s)).  Blood Alcohol level:  Lab Results  Component Value Date   ETH <10 12/19/2022   ETH <10 10/16/2022    Metabolic Disorder Labs: Lab Results  Component Value Date   HGBA1C 5.2 10/19/2022   MPG 103 10/19/2022   MPG 97 10/04/2022   Lab Results  Component Value Date   PROLACTIN 48.1 (H) 11/10/2021   Lab Results  Component Value Date   CHOL 190 12/19/2022   TRIG 92 12/19/2022   HDL 65 12/19/2022   CHOLHDL 2.9 12/19/2022   VLDL 18 12/19/2022   LDLCALC 107 (H) 12/19/2022   LDLCALC 101 (H) 10/19/2022    Physical Findings: AIMS:  , ,  ,  ,    CIWA:  CIWA-Ar Total: 0 COWS:     Musculoskeletal: Strength & Muscle Tone: within normal limits Gait & Station: normal Patient leans: N/A  Psychiatric Specialty Exam:  Presentation  General Appearance:  Casual  Eye Contact: Good  Speech: Normal Rate  Speech Volume: Normal  Handedness: Right   Mood and Affect  Mood: Depressed; Anxious  Affect: Restricted   Thought Process  Thought Processes: Linear  Descriptions of Associations:Intact  Orientation:Full (Time, Place and Person)  Thought Content:Logical  History of Schizophrenia/Schizoaffective disorder:No  Duration of Psychotic Symptoms:N/A  Hallucinations:Hallucinations: None  Ideas of Reference:None  Suicidal Thoughts:Suicidal Thoughts:  Yes, Passive SI Active Intent and/or Plan: Without Intent; Without Plan  Homicidal Thoughts:Homicidal Thoughts: No   Sensorium  Memory: Immediate Good; Recent Good; Remote Good  Judgment: Impaired  Insight: Lacking   Executive Functions  Concentration: Fair  Attention Span: Fair  Recall: Good  Fund of Knowledge: Good  Language: Good   Psychomotor Activity  Psychomotor Activity: Psychomotor Activity: Normal  Assets  Assets: Communication Skills; Desire for Improvement; Financial Resources/Insurance; Physical Health; Resilience; Social Support  Sleep  Sleep: Sleep: Good  Physical Exam: Physical Exam Vitals and nursing note reviewed.    Review of Systems  Constitutional:  Negative for chills, diaphoresis and fever.  HENT:  Negative for congestion and sore throat.   Respiratory:  Negative for cough, shortness of breath and wheezing.   Cardiovascular:  Negative for chest pain and palpitations.  Gastrointestinal:  Negative for abdominal pain, constipation, diarrhea, heartburn, nausea and vomiting.  Neurological:  Negative for dizziness, tingling, tremors, sensory change, speech change, focal weakness, seizures, loss of consciousness, weakness and headaches.  Endo/Heme/Allergies:        See the allergy lists.   Blood pressure 99/65, pulse 74, temperature 97.8 F (36.6 C), temperature source Oral, resp. rate 13, height 5\' 3"  (1.6 m), weight 98.4 kg, SpO2 98 %, not currently breastfeeding. Body mass index is 38.44 kg/m.  Treatment Plan Summary: Daily contact with patient to assess and evaluate symptoms and progress in treatment and Medication management.   Continue inpatient hospitalization.  Will continue today 12/22/2022 plan as below except where it is noted.   ASSESSMENT:   Diagnoses / Active Problems: Bipolar disorder, type II, current episode depressed GAD PTSD Cannabis abuse Alcohol abuse   PLAN: Safety and Monitoring:             --   Voluntary admission to inpatient psychiatric unit for safety, stabilization and treatment             --  Daily contact with patient to assess and evaluate symptoms and progress in treatment             -- Patient's case to be discussed in multi-disciplinary team meeting             -- Observation Level : q15 minute checks             -- Vital signs:  q12 hours             -- Precautions: suicide, elopement, and assault   2. Psychiatric Diagnoses and Treatment:               -Continue Abilify 5 mg at bedtime - for bipolar disorder   -Continue CIWA monitoring with as needed Ativan for any alcohol withdrawal symptoms   -Continue doxycycline 100 mg twice daily x 5 days to complete the course for spider bite    The risks/benefits/side-effects/alternatives to this medication were discussed in detail with the patient and time was given for questions. The patient consents to medication trial.                -- Metabolic profile and EKG monitoring obtained while on an atypical antipsychotic (BMI: Lipid Panel: HbgA1c: QTc:)              -- Encouraged patient to participate in unit milieu and in scheduled group therapies              -- Short Term Goals: Ability to identify changes in lifestyle to reduce recurrence of condition will improve, Ability to verbalize feelings will improve, Ability to disclose and discuss suicidal ideas, Ability to demonstrate self-control will improve, Ability to identify and develop effective coping behaviors will improve, Ability to maintain clinical measurements within normal limits will improve, Compliance with prescribed medications will improve, and Ability to identify triggers associated with substance abuse/mental health issues will improve             -- Long Term Goals: Improvement in symptoms so as ready for discharge                3. Medical Issues Being Addressed:              Tobacco Use Disorder             -- Nicotine gum as needed             -- Smoking  cessation encouraged   4. Discharge Planning:              -- Social work and case management to assist with discharge planning and identification of hospital follow-up needs prior to discharge             -- Estimated LOS: 5-7 days             -- Discharge Concerns: Need to establish a safety plan; Medication compliance and effectiveness             -- Discharge Goals: Return home with outpatient referrals for mental health follow-up including medication management/psychotherapy   Armandina Stammer, NP, pmhnp, fnp-bc. 12/22/2022, 6:39 PM

## 2022-12-22 NOTE — Progress Notes (Signed)
   12/21/22 2300  Psych Admission Type (Psych Patients Only)  Admission Status Voluntary  Psychosocial Assessment  Patient Complaints None  Eye Contact Fair  Facial Expression Animated  Affect Appropriate to circumstance  Speech Logical/coherent  Interaction Assertive  Motor Activity Slow  Appearance/Hygiene Unremarkable  Behavior Characteristics Cooperative;Appropriate to situation  Mood Pleasant  Thought Process  Coherency WDL  Content WDL  Delusions None reported or observed  Perception WDL  Hallucination None reported or observed  Judgment Poor  Confusion None  Danger to Self  Current suicidal ideation? Denies  Agreement Not to Harm Self Yes  Description of Agreement verbal  Danger to Others  Danger to Others None reported or observed

## 2022-12-22 NOTE — Progress Notes (Signed)
   12/22/22 0800  Psych Admission Type (Psych Patients Only)  Admission Status Voluntary  Psychosocial Assessment  Patient Complaints None  Eye Contact Fair  Facial Expression Animated  Affect Appropriate to circumstance  Speech Logical/coherent  Interaction Assertive  Motor Activity Slow  Appearance/Hygiene Unremarkable  Behavior Characteristics Cooperative;Appropriate to situation  Mood Depressed;Pleasant  Thought Process  Coherency WDL  Content WDL  Delusions None reported or observed  Perception WDL  Hallucination None reported or observed  Judgment Limited  Confusion None  Danger to Self  Current suicidal ideation? Denies  Agreement Not to Harm Self Yes  Description of Agreement verbal  Danger to Others  Danger to Others None reported or observed
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
## 2022-12-22 NOTE — [facility] Group Notes (Signed)
[facility] Group Notes:  (Nursing/MHT/Case Management/Adjunct)  Date:  12/22/2022  Time:  9:35 PM  Type of Therapy:   NA group  Participation Level:  Active  Participation Quality:  Appropriate  Affect:  Appropriate  Cognitive:  Appropriate  Insight:  Appropriate  Engagement in Group:  Engaged  Modes of Intervention:  Education  Summary of Progress/Problems: Attended NA group.  Sharon Ward 12/22/2022, 9:35 PM

## 2022-12-23 ENCOUNTER — Institutional Professional Consult (permissible substitution): Payer: Medicaid Other | Admitting: Nurse Practitioner

## 2022-12-23 NOTE — Plan of Care (Signed)
  Problem: Education: Goal: Emotional status will improve Outcome: Progressing Goal: Mental status will improve Outcome: Progressing Goal: Verbalization of understanding the information provided will improve Outcome: Progressing   Problem: Activity: Goal: Interest or engagement in activities will improve Outcome: Progressing Goal: Sleeping patterns will improve Outcome: Progressing   Problem: Coping: Goal: Ability to verbalize frustrations and anger appropriately will improve Outcome: Progressing Goal: Ability to demonstrate self-control will improve Outcome: Progressing   Problem: Health Behavior/Discharge Planning: Goal: Identification of resources available to assist in meeting health care needs will improve Outcome: Progressing   

## 2022-12-23 NOTE — Group Note (Signed)
Date:  12/23/2022 Time:  10:40 AM  Group Topic/Focus:  Goals Group:   The focus of this group is to help patients establish daily goals to achieve during treatment and discuss how the patient can incorporate goal setting into their daily lives to aide in recovery. Orientation:   The focus of this group is to educate the patient on the purpose and policies of crisis stabilization and provide a format to answer questions about their admission.  The group details unit policies and expectations of patients while admitted.    Participation Level:  Active  Participation Quality:  Attentive  Affect:  Appropriate  Cognitive:  Appropriate  Insight: Appropriate  Engagement in Group:  Engaged  Modes of Intervention:  Discussion  Additional Comments:  Patient attended goals group and was attentive the duration of it. Patient's goal was to attend all groups.   Sharon Ward T Sharon Ward 12/23/2022, 10:40 AM

## 2022-12-23 NOTE — Plan of Care (Signed)
  Problem: Education: Goal: Emotional status will improve Outcome: Progressing   Problem: Health Behavior/Discharge Planning: Goal: Compliance with treatment plan for underlying cause of condition will improve Outcome: Progressing   Problem: Safety: Goal: Periods of time without injury will increase Outcome: Progressing   Problem: Health Behavior/Discharge Planning: Goal: Identification of resources available to assist in meeting health care needs will improve Outcome: Progressing

## 2022-12-23 NOTE — Progress Notes (Signed)
[facility] MD Progress Note  12/23/2022 6:08 PM Sharon Ward  MRN:  [id number]  Reason for admission: 27 year old female with a psychiatric history of bipolar disorder, depression, anxiety disorder, presented to the psychiatric unit for worsening depression, suicidal thoughts and explosive outburst where she expressed having homicidal thoughts. Prior to admission outpatient psychiatric medications: Patient reports stopping medications about 1 to 2 weeks ago "because I was feeling depressed" Before that she was taking Abilify ?mg  Daily notes: Sharon Ward is seen, chart reviewed. The chart findings discussed with the treatment team. She presents alert, oriented & aware of situation. She presents with a good/reactive affect, good eye contact & verbally responsive. She reports, "I'm feeling pretty good today. I think I'm finally doing a lot better now that I'm back on my medicines. I'm thinking about getting discharged. I slept the whole night without waking up until this morning. Groups has been helpful". Sharon Ward currently denies any SIHI, AVH, delusional thoughts or paranoia. She does not appear to be responding to any internal stimuli. If patient maintains stability by tomorrow morning, she will be discharged. No changes made on her current plan of care.  Principal Problem: Bipolar 2 disorder, major depressive episode (HCC)  Diagnosis: Principal Problem:   Bipolar 2 disorder, major depressive episode (HCC) Active Problems:   Marijuana abuse   Borderline personality disorder (HCC)   PTSD (post-traumatic stress disorder)   GAD (generalized anxiety disorder)  Total Time spent with patient:  35 minutes  Past Psychiatric History: See H&P  Past Medical History:  Past Medical History:  Diagnosis Date   Asthma    Bipolar 1 disorder, mixed (HCC)    Depression    Generalized anxiety disorder    Intentional drug overdose (HCC) 11/03/2020   Low-lying placenta 01/07/2022   Resolved 03/04/22   Relationship  dysfunction    Suicide attempt (HCC) 11/02/2020    Past Surgical History:  Procedure Laterality Date   PILONIDAL CYST / SINUS EXCISION  09/11/2013   PILONIDAL CYST EXCISION  05/17/2014   Pilonidal cystectomy with cleft lip   Family History:  Family History  Problem Relation Age of Onset   Asthma Mother    Diabetes Mother    Healthy Mother    Hypertension Father    Asthma Father    Diabetes Father    Healthy Father    Asthma Brother    Hypertension Paternal Uncle    Diabetes Paternal Grandmother    Stroke Paternal Grandfather    Heart disease Paternal Grandfather    Hypertension Paternal Grandfather    Diabetes Paternal Grandfather    Family Psychiatric  History: See H&P  Social History:  Social History   Substance and Sexual Activity  Alcohol Use Not Currently   Comment: occasional prior to pregnancy     Social History   Substance and Sexual Activity  Drug Use Not Currently   Frequency: 4.0 times per week   Types: Marijuana   Comment: No Delta 9 since February 2023    Social History   Socioeconomic History   Marital status: Single    Spouse name: Not on file   Number of children: 1   Years of education: 10   Highest education level: 10th grade  Occupational History   Not on file  Tobacco Use   Smoking status: Former    Packs/day: 1.00    Years: 15.00    Additional pack years: 0.00    Total pack years: 15.00    Types: Cigarettes  Quit date: 07/14/2021    Years since quitting: 1.4   Smokeless tobacco: Never   Tobacco comments:    only smoke a "couple" of cigarettes when stressed or anxious, socially with friends per [facility] chart  Vaping Use   Vaping Use: Former   Start date: 08/10/2003   Quit date: 05/04/2021  Substance and Sexual Activity   Alcohol use: Not Currently    Comment: occasional prior to pregnancy   Drug use: Not Currently    Frequency: 4.0 times per week    Types: Marijuana    Comment: No Delta 9 since February 2023   Sexual activity:  Not Currently    Partners: Male    Birth control/protection: None  Other Topics Concern   Not on file  Social History Narrative   Not on file   Social Determinants of Health   Financial Resource Strain: Not on file  Food Insecurity: No Food Insecurity (12/20/2022)   Hunger Vital Sign    Worried About Running Out of Food in the Last Year: Never true    Ran Out of Food in the Last Year: Never true  Transportation Needs: No Transportation Needs (12/20/2022)   PRAPARE - Administrator, Civil Service (Medical): No    Lack of Transportation (Non-Medical): No  Physical Activity: Not on file  Stress: Not on file  Social Connections: Not on file   Additional Social History:   Sleep: Good  Appetite:  Good  Current Medications: Current Facility-Administered Medications  Medication Dose Route Frequency Provider Last Rate Last Admin   acetaminophen (TYLENOL) tablet 650 mg  650 mg Oral Q6H PRN Eston Heslin, Uchenna E, PA       alum & mag hydroxide-simeth (MAALOX/MYLANTA) 200-200-20 MG/5ML suspension 30 mL  30 mL Oral Q4H PRN Dawid Dupriest, Uchenna E, PA       ARIPiprazole (ABILIFY) tablet 5 mg  5 mg Oral QHS Massengill, Harrold Donath, MD   5 mg at 12/22/22 2059   doxycycline (VIBRA-TABS) tablet 100 mg  100 mg Oral BID WC Massengill, Harrold Donath, MD   100 mg at 12/23/22 1800   hydrOXYzine (ATARAX) tablet 25 mg  25 mg Oral TID PRN Tyner Codner, Uchenna E, PA   25 mg at 12/22/22 2059   loperamide (IMODIUM) capsule 2-4 mg  2-4 mg Oral PRN Massengill, Harrold Donath, MD       LORazepam (ATIVAN) tablet 1 mg  1 mg Oral Q6H PRN Massengill, Nathan, MD       magnesium hydroxide (MILK OF MAGNESIA) suspension 30 mL  30 mL Oral Daily PRN Kelsie Kramp, Uchenna E, PA       nicotine polacrilex (NICORETTE) gum 2 mg  2 mg Oral PRN Massengill, Harrold Donath, MD   2 mg at 12/23/22 0743   ondansetron (ZOFRAN-ODT) disintegrating tablet 4 mg  4 mg Oral Q6H PRN Massengill, Harrold Donath, MD       risperiDONE (RISPERDAL M-TABS) disintegrating tablet 2 mg  2 mg Oral  Q8H PRN Massengill, Nathan, MD       And   ziprasidone (GEODON) injection 20 mg  20 mg Intramuscular PRN Massengill, Harrold Donath, MD       traZODone (DESYREL) tablet 50 mg  50 mg Oral QHS PRN Raliyah Montella, Uchenna E, PA   50 mg at 12/22/22 2059    Lab Results: No results found for this or any previous visit (from the past 48 hour(s)).  Blood Alcohol level:  Lab Results  Component Value Date   ETH <10 12/19/2022   ETH <10 10/16/2022  Metabolic Disorder Labs: Lab Results  Component Value Date   HGBA1C 5.2 10/19/2022   MPG 103 10/19/2022   MPG 97 10/04/2022   Lab Results  Component Value Date   PROLACTIN 48.1 (H) 11/10/2021   Lab Results  Component Value Date   CHOL 190 12/19/2022   TRIG 92 12/19/2022   HDL 65 12/19/2022   CHOLHDL 2.9 12/19/2022   VLDL 18 12/19/2022   LDLCALC 107 (H) 12/19/2022   LDLCALC 101 (H) 10/19/2022    Physical Findings: AIMS:  , ,  ,  ,    CIWA:  CIWA-Ar Total: 1 COWS:     Musculoskeletal: Strength & Muscle Tone: within normal limits Gait & Station: normal Patient leans: N/A  Psychiatric Specialty Exam:  Presentation  General Appearance:  Casual  Eye Contact: Good  Speech: Normal Rate  Speech Volume: Normal  Handedness: Right   Mood and Affect  Mood: Depressed; Anxious  Affect: Restricted   Thought Process  Thought Processes: Linear  Descriptions of Associations:Intact  Orientation:Full (Time, Place and Person)  Thought Content:Logical  History of Schizophrenia/Schizoaffective disorder:No  Duration of Psychotic Symptoms:N/A  Hallucinations:No data recorded  Ideas of Reference:None  Suicidal Thoughts:No data recorded  Homicidal Thoughts:No data recorded   Sensorium  Memory: Immediate Good; Recent Good; Remote Good  Judgment: Impaired  Insight: Lacking   Executive Functions  Concentration: Fair  Attention Span: Fair  Recall: Good  Fund of Knowledge: Good  Language: Good   Psychomotor  Activity  Psychomotor Activity: No data recorded  Assets  Assets: Communication Skills; Desire for Improvement; Financial Resources/Insurance; Physical Health; Resilience; Social Support  Sleep  Sleep: No data recorded  Physical Exam: Physical Exam Vitals and nursing note reviewed.  Cardiovascular:     Rate and Rhythm: Normal rate.     Pulses: Normal pulses.  Musculoskeletal:        General: Normal range of motion.  Neurological:     General: No focal deficit present.     Mental Status: She is oriented to person, place, and time.    Review of Systems  Constitutional:  Negative for chills, diaphoresis and fever.  HENT:  Negative for congestion and sore throat.   Respiratory:  Negative for cough, shortness of breath and wheezing.   Cardiovascular:  Negative for chest pain and palpitations.  Gastrointestinal:  Negative for abdominal pain, constipation, diarrhea, heartburn, nausea and vomiting.  Neurological:  Negative for dizziness, tingling, tremors, sensory change, speech change, focal weakness, seizures, loss of consciousness, weakness and headaches.  Endo/Heme/Allergies:        See the allergy lists.  Psychiatric/Behavioral:  Positive for depression (Improving) and substance abuse. Negative for hallucinations, memory loss and suicidal ideas. The patient is not nervous/anxious and does not have insomnia.    Blood pressure (!) 102/56, pulse 67, temperature 97.7 F (36.5 C), temperature source Oral, resp. rate 13, height 5\' 3"  (1.6 m), weight 98.4 kg, SpO2 100 %, not currently breastfeeding. Body mass index is 38.44 kg/m.  Treatment Plan Summary: Daily contact with patient to assess and evaluate symptoms and progress in treatment and Medication management.   Continue inpatient hospitalization.  Will continue today 12/23/2022 plan as below except where it is noted.   ASSESSMENT:   Diagnoses / Active Problems: Bipolar disorder, type II, current episode  depressed GAD PTSD Cannabis abuse Alcohol abuse   PLAN: Safety and Monitoring:             --  Voluntary admission to inpatient psychiatric unit for  safety, stabilization and treatment             -- Daily contact with patient to assess and evaluate symptoms and progress in treatment             -- Patient's case to be discussed in multi-disciplinary team meeting             -- Observation Level : q15 minute checks             -- Vital signs:  q12 hours             -- Precautions: suicide, elopement, and assault   2. Psychiatric Diagnoses and Treatment:               -Continue Abilify 5 mg at bedtime - for bipolar disorder   -Continue CIWA monitoring with as needed Ativan for any alcohol withdrawal symptoms   -Continue doxycycline 100 mg twice daily x 5 days to complete the course for spider bite    The risks/benefits/side-effects/alternatives to this medication were discussed in detail with the patient and time was given for questions. The patient consents to medication trial.                -- Metabolic profile and EKG monitoring obtained while on an atypical antipsychotic (BMI: Lipid Panel: HbgA1c: QTc:)              -- Encouraged patient to participate in unit milieu and in scheduled group therapies              -- Short Term Goals: Ability to identify changes in lifestyle to reduce recurrence of condition will improve, Ability to verbalize feelings will improve, Ability to disclose and discuss suicidal ideas, Ability to demonstrate self-control will improve, Ability to identify and develop effective coping behaviors will improve, Ability to maintain clinical measurements within normal limits will improve, Compliance with prescribed medications will improve, and Ability to identify triggers associated with substance abuse/mental health issues will improve             -- Long Term Goals: Improvement in symptoms so as ready for discharge                3. Medical Issues Being  Addressed:              Tobacco Use Disorder             -- Nicotine gum as needed             -- Smoking cessation encouraged   4. Discharge Planning:              -- Social work and case management to assist with discharge planning and identification of hospital follow-up needs prior to discharge             -- Estimated LOS: 5-7 days             -- Discharge Concerns: Need to establish a safety plan; Medication compliance and effectiveness             -- Discharge Goals: Return home with outpatient referrals for mental health follow-up including medication management/psychotherapy   Armandina Stammer, NP, pmhnp, fnp-bc. 12/23/2022, 6:08 PMPatient ID: Sharon Ward, female   DOB: 10/17/1995, 27 y.o.   MRN: [id number]

## 2022-12-23 NOTE — Progress Notes (Signed)
   12/23/22 2000  Psych Admission Type (Psych Patients Only)  Admission Status Voluntary  Psychosocial Assessment  Patient Complaints None  Eye Contact Fair  Facial Expression Animated  Affect Appropriate to circumstance  Speech Logical/coherent  Interaction Assertive  Motor Activity Slow  Appearance/Hygiene Unremarkable  Behavior Characteristics Cooperative;Appropriate to situation  Mood Pleasant  Thought Process  Coherency WDL  Content WDL  Delusions None reported or observed  Perception WDL  Hallucination None reported or observed  Judgment Poor  Confusion None  Danger to Self  Current suicidal ideation? Denies  Agreement Not to Harm Self Yes  Description of Agreement verbal  Danger to Others  Danger to Others None reported or observed

## 2022-12-23 NOTE — Progress Notes (Signed)
Patient appears in improved mood and observed socializing appropriately with peers and attending groups. Patient Is med compliant. Denies SI,HI, and A/V/H with no plan or intent. Patient reports her depression is a 1 out of 10 and anxiety/hopelessness a 0 out of 10. Patient states she feels ready for discharge and has been thinking about her plans for when she leaves here. Patient cooperative in unit and in no current distress.

## 2022-12-23 NOTE — Progress Notes (Signed)
   12/22/22 1959  Psych Admission Type (Psych Patients Only)  Admission Status Voluntary  Psychosocial Assessment  Patient Complaints None  Eye Contact Fair  Facial Expression Animated  Affect Appropriate to circumstance  Speech Logical/coherent  Interaction Assertive  Motor Activity Slow  Appearance/Hygiene Unremarkable  Behavior Characteristics Cooperative;Appropriate to situation  Mood Depressed;Sad  Thought Process  Coherency WDL  Content WDL  Delusions None reported or observed  Perception WDL  Hallucination None reported or observed  Judgment Limited  Confusion None  Danger to Self  Current suicidal ideation? Denies  Agreement Not to Harm Self Yes  Description of Agreement verbal  Danger to Others  Danger to Others None reported or observed

## 2022-12-23 NOTE — [facility] Group Notes (Signed)
[facility] Group Notes:  (Nursing/MHT/Case Management/Adjunct)  Date:  12/23/2022  Time:  9:50 PM  Type of Therapy:   Wrap-up group  Participation Level:  Active  Participation Quality:  Appropriate  Affect:  Appropriate  Cognitive:  Appropriate  Insight:  Appropriate  Engagement in Group:  Engaged  Modes of Intervention:  Education  Summary of Progress/Problems: Goal to talk to Dr. About D/C. Day 9/10.  Sharon Ward 12/23/2022, 9:50 PM

## 2022-12-24 DIAGNOSIS — F3181 Bipolar II disorder: Principal | ICD-10-CM

## 2022-12-24 MED ORDER — DOXYCYCLINE HYCLATE 100 MG PO TABS: 100.0000 mg | ORAL_TABLET | Freq: Two times a day (BID) | ORAL | 0 refills | Status: AC

## 2022-12-24 MED ORDER — HYDROXYZINE HCL 25 MG PO TABS: 25.0000 mg | ORAL_TABLET | Freq: Three times a day (TID) | ORAL | 0 refills | Status: DC | PRN

## 2022-12-24 MED ORDER — TRAZODONE HCL 50 MG PO TABS: 50.0000 mg | ORAL_TABLET | Freq: Every evening | ORAL | 0 refills | Status: DC | PRN

## 2022-12-24 MED ORDER — ARIPIPRAZOLE 5 MG PO TABS: 5.0000 mg | ORAL_TABLET | Freq: Every day | ORAL | 0 refills | Status: DC

## 2022-12-24 MED ORDER — NICOTINE POLACRILEX 2 MG MT GUM: 2.0000 mg | CHEWING_GUM | OROMUCOSAL | 0 refills | Status: DC | PRN

## 2022-12-24 NOTE — [facility] Suicide Risk Assessment (Signed)
Suicide Risk Assessment  Discharge Assessment    [facility] Discharge Suicide Risk Assessment   Principal Problem: Bipolar 2 disorder, major depressive episode (HCC)  Discharge Diagnoses: Principal Problem:   Bipolar 2 disorder, major depressive episode (HCC) Active Problems:   Marijuana abuse   Borderline personality disorder (HCC)   PTSD (post-traumatic stress disorder)   GAD (generalized anxiety disorder)  Total Time spent with patient:  Greater than 30 minutes  Musculoskeletal: Strength & Muscle Tone: within normal limits Gait & Station: normal Patient leans: N/A  Psychiatric Specialty Exam  Presentation  General Appearance:  Appropriate for Environment; Casual; Fairly Groomed  Eye Contact: Good  Speech: Normal Rate; Clear and Coherent  Speech Volume: Normal  Handedness: Right   Mood and Affect  Mood: Euthymic  Duration of Depression Symptoms: Greater than two weeks  Affect: Appropriate; Congruent; Full Range   Thought Process  Thought Processes: Linear  Descriptions of Associations:Intact  Orientation:Full (Time, Place and Person)  Thought Content:Logical  History of Schizophrenia/Schizoaffective disorder:No  Duration of Psychotic Symptoms:N/A  Hallucinations:Hallucinations: None  Ideas of Reference:None  Suicidal Thoughts:Suicidal Thoughts: No  Homicidal Thoughts:Homicidal Thoughts: No   Sensorium  Memory: Immediate Good; Recent Good; Remote Good  Judgment: Fair  Insight: Fair   Art therapist  Concentration: Fair  Attention Span: Fair  Recall: Good  Fund of Knowledge: Good  Language: Good   Psychomotor Activity  Psychomotor Activity: Psychomotor Activity: Normal   Assets  Assets: Communication Skills; Desire for Improvement; Financial Resources/Insurance; Physical Health; Resilience; Social Support   Sleep  Sleep: Sleep: Fair   Physical Exam:  Blood pressure 116/60, pulse 77, temperature (!)  97.5 F (36.4 C), temperature source Oral, resp. rate 16, height 5\' 3"  (1.6 m), weight 98.4 kg, SpO2 99 %, not currently breastfeeding. Body mass index is 38.44 kg/m.  Mental Status Per Nursing Assessment::   On Admission:  Suicidal ideation indicated by patient, Self-harm behaviors  Demographic Factors:  Adolescent or young adult, Caucasian, and Unemployed  Loss Factors: NA  Historical Factors: Prior suicide attempts and Impulsivity  Risk Reduction Factors:   Responsible for children under 57 years of age, Sense of responsibility to family, Positive social support, Positive therapeutic relationship, and Positive coping skills or problem solving skills  Continued Clinical Symptoms:  Bipolar Disorder:   Bipolar II Alcohol/Substance Abuse/Dependencies Unstable or Poor Therapeutic Relationship Previous Psychiatric Diagnoses and Treatments  Cognitive Features That Contribute To Risk:  Closed-mindedness, Polarized thinking, and Thought constriction (tunnel vision)    Suicide Risk:  Minimal: No identifiable suicidal ideation.  Patients presenting with no risk factors but with morbid ruminations; may be classified as minimal risk based on the severity of the depressive symptoms   Follow-up Information     [facility].   Why: Please follow up with your [facility] for ongoing mental health follow up. Contact information: [address]  [address] [place] [address] [phone]                Plan Of Care/Follow-up recommendations:  See the discharge recommendation above.  Armandina Stammer, NP 12/24/2022, 11:01 AM

## 2022-12-24 NOTE — Discharge Summary (Signed)
Physician Discharge Summary Note  Patient:  Sharon Ward is an 27 y.o., female  MRN:  [id number]  DOB:  1996/03/20  Patient phone:  [phone] (home)   Patient address:   [address] [place] [address],   Total Time spent with patient:  Greater than 30 minutes  Date of Admission:  12/20/2022  Date of Discharge: 12-24-22.  Reason for Admission: Worsening depression, suicidal thoughts and explosive outburst where she expressed having homicidal thoughts.  Principal Problem: Bipolar 2 disorder, major depressive episode [facility])  Discharge Diagnoses: Principal Problem:   Bipolar 2 disorder, major depressive episode (HCC) Active Problems:   Marijuana abuse   Borderline personality disorder (HCC)   PTSD (post-traumatic stress disorder)   GAD (generalized anxiety disorder)  Past Psychiatric History: Bipolar disorder, depressed.  Past Medical History:  Past Medical History:  Diagnosis Date   Asthma    Bipolar 1 disorder, mixed (HCC)    Depression    Generalized anxiety disorder    Intentional drug overdose (HCC) 11/03/2020   Low-lying placenta 01/07/2022   Resolved 03/04/22   Relationship dysfunction    Suicide attempt (HCC) 11/02/2020    Past Surgical History:  Procedure Laterality Date   PILONIDAL CYST / SINUS EXCISION  09/11/2013   PILONIDAL CYST EXCISION  05/17/2014   Pilonidal cystectomy with cleft lip   Family History:  Family History  Problem Relation Age of Onset   Asthma Mother    Diabetes Mother    Healthy Mother    Hypertension Father    Asthma Father    Diabetes Father    Healthy Father    Asthma Brother    Hypertension Paternal Uncle    Diabetes Paternal Grandmother    Stroke Paternal Grandfather    Heart disease Paternal Grandfather    Hypertension Paternal Grandfather    Diabetes Paternal Grandfather    Family Psychiatric  History: See H&P.  Social History:  Social History   Substance and Sexual Activity  Alcohol Use  Not Currently   Comment: occasional prior to pregnancy     Social History   Substance and Sexual Activity  Drug Use Not Currently   Frequency: 4.0 times per week   Types: Marijuana   Comment: No Delta 9 since February 2023    Social History   Socioeconomic History   Marital status: Single    Spouse name: Not on file   Number of children: 1   Years of education: 10   Highest education level: 10th grade  Occupational History   Not on file  Tobacco Use   Smoking status: Former    Packs/day: 1.00    Years: 15.00    Additional pack years: 0.00    Total pack years: 15.00    Types: Cigarettes    Quit date: 07/14/2021    Years since quitting: 1.4   Smokeless tobacco: Never   Tobacco comments:    only smoke a "couple" of cigarettes when stressed or anxious, socially with friends per [facility] chart  Vaping Use   Vaping Use: Former   Start date: 08/10/2003   Quit date: 05/04/2021  Substance and Sexual Activity   Alcohol use: Not Currently    Comment: occasional prior to pregnancy   Drug use: Not Currently    Frequency: 4.0 times per week    Types: Marijuana    Comment: No Delta 9 since February 2023   Sexual activity: Not Currently    Partners: Male    Birth control/protection:  None  Other Topics Concern   Not on file  Social History Narrative   Not on file   Social Determinants of Health   Financial Resource Strain: Not on file  Food Insecurity: No Food Insecurity (12/20/2022)   Hunger Vital Sign    Worried About Running Out of Food in the Last Year: Never true    Ran Out of Food in the Last Year: Never true  Transportation Needs: No Transportation Needs (12/20/2022)   PRAPARE - Administrator, Civil Service (Medical): No    Lack of Transportation (Non-Medical): No  Physical Activity: Not on file  Stress: Not on file  Social Connections: Not on file   Hospital Course: (Per admission evaluation lists):  27 year old female with a psychiatric history of bipolar  disorder, depression, anxiety disorder, presented to the psychiatric unit for worsening depression, suicidal thoughts and explosive outburst where she expressed having homicidal thoughts. Prior to admission outpatient psychiatric medications: Patient reports stopping medications about 1 to 2 weeks ago "because I was feeling depressed"  Before that she was taking Abilify ?mg  Upon the decision to discharge Miles today, she was seen & evaluated  by her treatment team for mental health stability. The current laboratory findings were reviewed. The nurses notes & vital signs were reviewed as well. All are stable. At this present time, there are no current mental health or medical issues that should prevent this discharge at this time. Patient is being discharged to continue mental health care & medication management as noted below.   Sharon Ward has been a patient in this [facility] x numerous times. In each of her admissions, her presentations were all with similar complaints. She was admitted to the [facility] this time around for crisis management due to worsening symptoms she has been dealing with her chronic mental health issues.And that was worsening depression, suicidal thoughts & explosive outburst where she expressed having homicidal thoughts.   Patient is from [place], [place]. After her evaluation at the [facility], she was brought to the [facility] for further psychiatric evaluation & treatments.   After her arrival to the [facility] & after evaluation of her presenting symptoms, Sharon Ward was recommended for mood stabilization treatments. And with her consent, she was treated, stabilized & discharged on the medications as listed below on his discharge medication lists. These medications with their indications & side effects were explained to the patient. She given time to ask any questions she may have at the time & throughout this hospitalization. Her other pre-existing medical conditions were identified & treated accordingly by  resuming/discharging her on all those medications used for those medical issues. She tolerated her treatment regimen without any adverse effects or reactions reported. Cyndee's symptoms responded well to her treatment regimen warranting this discharge. She is currently mentally & medically stable to be discharged to continue routine mental health care & medication management as recommended below. She is agreeable to this discharge as well.  During the course of this hospitalization, the 15-minute checks were adequate to ensure Cayenne's safety.  Patient did not display any dangerous violent or suicidal behavior on the unit.  She interacted with the other patients & staff appropriately, participated appropriately in the group sessions/therapies. Her medications were addressed & adjusted to meet her needs. She was recommended for outpatient follow-up care & medication management upon discharge to assure continuity of care & mood stability.  At the time of discharge patient is not reporting any acute suicidal/homicidal ideations. She currently denies any new  issues or concerns. Education and supportive counseling provided throughout his hospital stay & upon discharge.  Today upon her discharge evaluation with his treatment team, Lorey shares she is doing well. She denies any other specific concerns. She is sleeping well. Her appetite is good. She denies other physical complaints. She denies AH/VH, delusional thoughts or paranoia. She feels that her medications have been helpful & is in agreement to continue her current treatment regimen. She was able to engage in safety planning including plan to return to [facility] or contact emergency services if she feels unable to maintain her own safety or the safety of others. Pt had no further questions, comments, or concerns. She left [facility] with all personal belongings in no apparent distress. Transportation per the city bus. [facility] assisted with bus pass.     Physical  Findings: AIMS:  , ,  ,  ,    CIWA:  CIWA-Ar Total: 1 COWS:     Musculoskeletal: Strength & Muscle Tone: within normal limits Gait & Station: normal Patient leans: N/A  Psychiatric Specialty Exam: Presentation  General Appearance:  Appropriate for Environment; Casual; Fairly Groomed  Eye Contact: Good  Speech: Normal Rate; Clear and Coherent  Speech Volume: Normal  Handedness: Right   Mood and Affect  Mood: Euthymic  Affect: Appropriate; Congruent; Full Range  Thought Process  Thought Processes: Linear  Descriptions of Associations:Intact  Orientation:Full (Time, Place and Person)  Thought Content:Logical  History of Schizophrenia/Schizoaffective disorder:No  Duration of Psychotic Symptoms:N/A  Hallucinations:Hallucinations: None  Ideas of Reference:None  Suicidal Thoughts:Suicidal Thoughts: No  Homicidal Thoughts:Homicidal Thoughts: No   Sensorium  Memory: Immediate Good; Recent Good; Remote Good  Judgment: Fair  Insight: Fair   Art therapist  Concentration: Fair  Attention Span: Fair  Recall: Good  Fund of Knowledge: Good  Language: Good  Psychomotor Activity  Psychomotor Activity: Psychomotor Activity: Normal  Assets  Assets: Communication Skills; Desire for Improvement; Financial Resources/Insurance; Physical Health; Resilience; Social Support  Sleep  Sleep: Sleep: Fair  Physical Exam: Physical Exam Vitals and nursing note reviewed.  HENT:     Head: Normocephalic.     Nose: Nose normal.     Mouth/Throat:     Pharynx: Oropharynx is clear.  Eyes:     Pupils: Pupils are equal, round, and reactive to light.  Cardiovascular:     Rate and Rhythm: Normal rate.     Pulses: Normal pulses.  Pulmonary:     Effort: Pulmonary effort is normal.  Genitourinary:    Comments: Deferred Musculoskeletal:        General: Normal range of motion.     Cervical back: Normal range of motion.  Skin:    General: Skin  is warm and dry.  Neurological:     General: No focal deficit present.     Mental Status: She is alert and oriented to person, place, and time. Mental status is at baseline.    Review of Systems  Constitutional:  Negative for chills, diaphoresis and fever.  HENT:  Negative for congestion and sore throat.   Eyes:  Negative for blurred vision.  Respiratory:  Negative for cough, shortness of breath and wheezing.   Cardiovascular:  Negative for chest pain and palpitations.  Gastrointestinal:  Negative for abdominal pain, constipation, diarrhea, heartburn, nausea and vomiting.  Genitourinary:  Negative for dysuria.  Musculoskeletal:  Negative for joint pain and myalgias.  Skin:  Negative for itching and rash.  Neurological:  Negative for dizziness, tingling, tremors, sensory change, speech change,  focal weakness, seizures, loss of consciousness, weakness and headaches.  Endo/Heme/Allergies:        See allergy lists.  Psychiatric/Behavioral:  Positive for depression (Hx of (stable on medication).). Negative for hallucinations, memory loss, substance abuse and suicidal ideas (Hx. chronic suicidality - stable upon discharge.). The patient has insomnia (Hx of (stable on medication).). The patient is not nervous/anxious (Stable upon discharge.).    Blood pressure 116/60, pulse 77, temperature (!) 97.5 F (36.4 C), temperature source Oral, resp. rate 16, height 5\' 3"  (1.6 m), weight 98.4 kg, SpO2 99 %, not currently breastfeeding. Body mass index is 38.44 kg/m.   Social History   Tobacco Use  Smoking Status Former   Packs/day: 1.00   Years: 15.00   Additional pack years: 0.00   Total pack years: 15.00   Types: Cigarettes   Quit date: 07/14/2021   Years since quitting: 1.4  Smokeless Tobacco Never  Tobacco Comments   only smoke a "couple" of cigarettes when stressed or anxious, socially with friends per [facility] chart   Tobacco Cessation:  An FDA-approved tobacco cessation medication  recommended at discharge  Blood Alcohol level:  Lab Results  Component Value Date   ETH <10 12/19/2022   ETH <10 10/16/2022   Metabolic Disorder Labs:  Lab Results  Component Value Date   HGBA1C 5.2 10/19/2022   MPG 103 10/19/2022   MPG 97 10/04/2022   Lab Results  Component Value Date   PROLACTIN 48.1 (H) 11/10/2021   Lab Results  Component Value Date   CHOL 190 12/19/2022   TRIG 92 12/19/2022   HDL 65 12/19/2022   CHOLHDL 2.9 12/19/2022   VLDL 18 12/19/2022   LDLCALC 107 (H) 12/19/2022   LDLCALC 101 (H) 10/19/2022   See Psychiatric Specialty Exam and Suicide Risk Assessment completed by Attending Physician prior to discharge.  Discharge destination:  Home  Is patient on multiple antipsychotic therapies at discharge:  No   Has Patient had three or more failed trials of antipsychotic monotherapy by history:  No  Recommended Plan for Multiple Antipsychotic Therapies: NA Discharge Instructions     Diet - low sodium heart healthy   Complete by: As directed    Increase activity slowly   Complete by: As directed       Allergies as of 12/24/2022       Reactions   Ascorbate Rash   Citrus Rash   Coconut Flavor Rash   Lamotrigine Rash   Orange (diagnostic) Rash   Peach Flavor Rash   Pear Rash   Pineapple Rash        Medication List     STOP taking these medications    doxycycline 100 MG capsule Commonly known as: VIBRAMYCIN Replaced by: doxycycline 100 MG tablet       TAKE these medications      Indication  ARIPiprazole 5 MG tablet Commonly known as: ABILIFY Take 1 tablet (5 mg total) by mouth at bedtime. What changed:  medication strength how much to take  Indication: MIXED BIPOLAR AFFECTIVE DISORDER   doxycycline 100 MG tablet Commonly known as: VIBRA-TABS Take 1 tablet (100 mg total) by mouth 2 (two) times daily with a meal for 2 days. Replaces: doxycycline 100 MG capsule  Indication: spider bite   hydrOXYzine 25 MG tablet Commonly  known as: ATARAX Take 1 tablet (25 mg total) by mouth 3 (three) times daily as needed for anxiety. What changed: additional instructions  Indication: Feeling Anxious   nicotine polacrilex 2 MG  gum Commonly known as: NICORETTE Take 1 each (2 mg total) by mouth as needed for smoking cessation.  Indication: Nicotine Addiction   traZODone 50 MG tablet Commonly known as: DESYREL Take 1 tablet (50 mg total) by mouth at bedtime as needed for sleep. What changed: additional instructions  Indication: Trouble Sleeping        Follow-up Information     [facility]. Go to.   Why: Please follow up with your ACTT for ongoing mental health follow up. Contact information: [address]  [address] [place] [address] [phone]                Follow-up recommendations: Activity:  As tolerated Diet: As recommended by your primary care doctor. Keep all scheduled follow-up appointments as recommended.   Comments: Comments: Patient is recommended to follow-up care on an outpatient basis as noted above. Prescriptions sent to pt's pharmacy of choice at discharge.   Patient agreeable to plan.   Given opportunity to ask questions.   Appears to feel comfortable with discharge denies any current suicidal or homicidal thought. Patient is also instructed prior to discharge to: Take all medications as prescribed by his/her mental healthcare provider. Report any adverse effects and or reactions from the medicines to his/her outpatient provider promptly. Patient has been instructed & cautioned: To not engage in alcohol and or illegal drug use while on prescription medicines. In the event of worsening symptoms, patient is instructed to call the crisis hotline, 911 and or go to the nearest ED for appropriate evaluation and treatment of symptoms. To follow-up with his/her primary care provider for your other medical issues, concerns and or health care needs.  Signed: Armandina Stammer, NP, pmhnp,  fnp-bc. 12/24/2022, 9:39 AM

## 2022-12-24 NOTE — [facility] Group Notes (Signed)
The focus of this group is to help patients establish daily goals to achieve during treatment and discuss how the patient can incorporate goal setting into their daily lives to aide in recovery.   9 out of 10  Goal "Work on myself and my mental health"

## 2022-12-24 NOTE — Group Note (Signed)
Recreation Therapy Group Note   Group Topic:Leisure Education  Group Date: 12/24/2022 Start Time: 0935 End Time: 1009 Facilitators: Meko Masterson-McCall, LRT,CTRS Location: [address] Day[address]   Goal Area(s) Addresses:  Patient will identify positive leisure and recreation activities.  Patient will identify one positive benefit of participation in leisure activities.   Group Description: Leisure Charades. Patients were divided in to 2 groups for game play. LRT used small strips of paper with 1 listed leisure or recreation activity. Patients took turns randomly drawing a slip of paper and acting out the identified activity without using words or sounds. Points were awarded to the team with the first correct guess. After several rounds of game play, team with the most points were declared the winners.   Affect/Mood: Appropriate   Participation Level: Engaged   Participation Quality: Independent   Behavior: Appropriate   Speech/Thought Process: Focused   Insight: Good   Judgement: Good   Modes of Intervention: Competitive Play   Patient Response to Interventions:  Engaged   Education Outcome:  Acknowledges education   Clinical Observations/Individualized Feedback: Pt attended and participated in group.     Plan: Continue to engage patient in RT group sessions 2-3x/week.   Cash Duce-McCall, LRT,CTRS 12/24/2022 11:53 AM

## 2022-12-24 NOTE — Progress Notes (Signed)
Discharge Note:  Patient discharged home.  Patient denied SI and HI.  Denied A/V hallucinations.  Patient stated she received all her belongings, clothing, toiletries, misc items.  All required discharge information given.

## 2022-12-24 NOTE — Discharge Instructions (Signed)
-  Follow-up with your outpatient psychiatric provider -instructions on appointment date, time, and address (location) are provided to you in discharge paperwork.  -Take your psychiatric medications as prescribed at discharge - instructions are provided to you in the discharge paperwork  -Follow-up with outpatient primary care doctor and other specialists -for management of preventative medicine and any chronic medical disease.  -Recommend abstinence from alcohol, tobacco, and other illicit drug use at discharge.   -If your psychiatric symptoms recur, worsen, or if you have side effects to your psychiatric medications, call your outpatient psychiatric provider, 911, [phone] or go to the nearest emergency department.  -If suicidal thoughts occur, call your outpatient psychiatric provider, 911, [phone] or go to the nearest emergency department.  Naloxone (Narcan) can help reverse an overdose when given to the victim quickly.  [place] offers free naloxone kits and instructions/training on its use.  Add naloxone to your first aid kit and you can help save a life.   Pick up your free kit at the following locations:   [place]:  [facility], [address] [place] [address] ([phone]) [facility] [address] [place] [address] ([phone]) [facility] [address] [place] [address]  [place]: [facility] [address] [place] [address] ([phone]) [facility] [address] [place] [address] ([phone])  

## 2022-12-24 NOTE — Progress Notes (Signed)
  [facility] Adult Case Management Discharge Plan :  Will you be returning to the same living situation after discharge:  Yes,  Pt will return to her apartment and contact the [facility] At discharge, do you have transportation home?: Yes,  Pt was provided with a bus pass Do you have the ability to pay for your medications: Yes,  Insured  Release of information consent forms completed and in the chart;  Patient's signature needed at discharge.  Patient to Follow up at:  Follow-up Information     [facility]. Go to.   Why: Please follow up with your ACTT for ongoing mental health follow up. Contact information: [address]  [address] [place] [address] [phone]                 Next level of care provider has access to [facility] Link:yes  Safety Planning and Suicide Prevention discussed: Yes,  [facility] 336 [phone]     Has patient been referred to the Quitline?: Patient refused referral for treatment  Patient has been referred for addiction treatment: Yes, referral information given but appointment not made [facility] (list facility). Patient to continue working towards treatment goals after discharge. Patient no longer meets criteria for inpatient criteria per attending physician. Continue taking medications as prescribed, nursing to provide instructions at discharge. Follow up with all scheduled appointments.   Harlo Jaso S Amelya Mabry, LCSW 12/24/2022, 9:58 AM

## 2022-12-28 ENCOUNTER — Encounter (HOSPITAL_COMMUNITY): Payer: No Payment, Other | Admitting: Psychiatry

## 2022-12-31 ENCOUNTER — Emergency Department (EMERGENCY_DEPARTMENT_HOSPITAL)
Admission: EM | Admit: 2022-12-31 | Discharge: 2023-01-01 | Disposition: A | Discharge status: Other Acute Inpatient Hospital | Payer: Medicaid Other | Source: Home / Self Care | Attending: Emergency Medicine | Admitting: Emergency Medicine

## 2022-12-31 DIAGNOSIS — F4312 Post-traumatic stress disorder, chronic: Secondary | ICD-10-CM | POA: Diagnosis present

## 2022-12-31 DIAGNOSIS — T1491XA Suicide attempt, initial encounter: Secondary | ICD-10-CM

## 2022-12-31 DIAGNOSIS — F3181 Bipolar II disorder: Secondary | ICD-10-CM | POA: Diagnosis present

## 2022-12-31 DIAGNOSIS — F3130 Bipolar disorder, current episode depressed, mild or moderate severity, unspecified: Secondary | ICD-10-CM | POA: Diagnosis present

## 2022-12-31 DIAGNOSIS — F332 Major depressive disorder, recurrent severe without psychotic features: Secondary | ICD-10-CM | POA: Diagnosis present

## 2022-12-31 DIAGNOSIS — Z87891 Personal history of nicotine dependence: Secondary | ICD-10-CM | POA: Insufficient documentation

## 2022-12-31 DIAGNOSIS — F319 Bipolar disorder, unspecified: Secondary | ICD-10-CM | POA: Diagnosis present

## 2022-12-31 DIAGNOSIS — T50902A Poisoning by unspecified drugs, medicaments and biological substances, intentional self-harm, initial encounter: Secondary | ICD-10-CM | POA: Insufficient documentation

## 2022-12-31 DIAGNOSIS — Z9151 Personal history of suicidal behavior: Secondary | ICD-10-CM

## 2022-12-31 DIAGNOSIS — J45909 Unspecified asthma, uncomplicated: Secondary | ICD-10-CM | POA: Insufficient documentation

## 2022-12-31 DIAGNOSIS — F411 Generalized anxiety disorder: Secondary | ICD-10-CM | POA: Diagnosis present

## 2022-12-31 DIAGNOSIS — F329 Major depressive disorder, single episode, unspecified: Secondary | ICD-10-CM | POA: Diagnosis present

## 2022-12-31 DIAGNOSIS — Z1152 Encounter for screening for COVID-19: Secondary | ICD-10-CM | POA: Insufficient documentation

## 2022-12-31 DIAGNOSIS — F4310 Post-traumatic stress disorder, unspecified: Secondary | ICD-10-CM | POA: Insufficient documentation

## 2022-12-31 DIAGNOSIS — F314 Bipolar disorder, current episode depressed, severe, without psychotic features: Secondary | ICD-10-CM | POA: Diagnosis present

## 2022-12-31 DIAGNOSIS — T50901A Poisoning by unspecified drugs, medicaments and biological substances, accidental (unintentional), initial encounter: Secondary | ICD-10-CM | POA: Diagnosis present

## 2022-12-31 DIAGNOSIS — T71162A Asphyxiation due to hanging, intentional self-harm, initial encounter: Secondary | ICD-10-CM | POA: Insufficient documentation

## 2022-12-31 DIAGNOSIS — R45851 Suicidal ideations: Secondary | ICD-10-CM | POA: Insufficient documentation

## 2022-12-31 HISTORY — DX: Personal history of suicidal behavior: Z91.51

## 2022-12-31 LAB — RAPID URINE DRUG SCREEN, HOSP PERFORMED
Amphetamines: NOT DETECTED
Barbiturates: NOT DETECTED
Benzodiazepines: NOT DETECTED
Cocaine: NOT DETECTED
Opiates: NOT DETECTED
Tetrahydrocannabinol: NOT DETECTED

## 2022-12-31 LAB — CBC WITH DIFFERENTIAL/PLATELET
Abs Immature Granulocytes: 0.01 10*3/uL (ref 0.00–0.07)
Basophils Absolute: 0 10*3/uL (ref 0.0–0.1)
Basophils Relative: 1 %
Eosinophils Absolute: 0.1 10*3/uL (ref 0.0–0.5)
Eosinophils Relative: 2 %
HCT: 38 % (ref 36.0–46.0)
Hemoglobin: 12.5 g/dL (ref 12.0–15.0)
Immature Granulocytes: 0 %
Lymphocytes Relative: 40 %
Lymphs Abs: 2.3 10*3/uL (ref 0.7–4.0)
MCH: 29.5 pg (ref 26.0–34.0)
MCHC: 32.9 g/dL (ref 30.0–36.0)
MCV: 89.6 fL (ref 80.0–100.0)
Monocytes Absolute: 0.5 10*3/uL (ref 0.1–1.0)
Monocytes Relative: 10 %
Neutro Abs: 2.8 10*3/uL (ref 1.7–7.7)
Neutrophils Relative %: 47 %
Platelets: 222 10*3/uL (ref 150–400)
RBC: 4.24 MIL/uL (ref 3.87–5.11)
RDW: 13.2 % (ref 11.5–15.5)
WBC: 5.7 10*3/uL (ref 4.0–10.5)
nRBC: 0 % (ref 0.0–0.2)

## 2022-12-31 LAB — URINALYSIS, ROUTINE W REFLEX MICROSCOPIC
Bilirubin Urine: NEGATIVE
Glucose, UA: NEGATIVE mg/dL
Hgb urine dipstick: NEGATIVE
Ketones, ur: NEGATIVE mg/dL
Nitrite: NEGATIVE
Protein, ur: 30 mg/dL — AB
Specific Gravity, Urine: 1.018 (ref 1.005–1.030)
Squamous Epithelial / HPF: >50 /HPF (ref 0–5)
WBC, UA: >50 WBC/hpf (ref 0–5)
pH: 5 (ref 5.0–8.0)

## 2022-12-31 LAB — COMPREHENSIVE METABOLIC PANEL
ALT: 17 U/L (ref 0–44)
AST: 18 U/L (ref 15–41)
Albumin: 4.2 g/dL (ref 3.5–5.0)
Alkaline Phosphatase: 57 U/L (ref 38–126)
Anion gap: 12 (ref 5–15)
BUN: 10 mg/dL (ref 6–20)
CO2: 23 mmol/L (ref 22–32)
Calcium: 9.5 mg/dL (ref 8.9–10.3)
Chloride: 103 mmol/L (ref 98–111)
Creatinine, Ser: 0.91 mg/dL (ref 0.44–1.00)
GFR, Estimated: >60 mL/min (ref >60)
Glucose, Bld: 101 mg/dL — ABNORMAL HIGH (ref 70–99)
Potassium: 3.6 mmol/L (ref 3.5–5.1)
Sodium: 138 mmol/L (ref 135–145)
Total Bilirubin: 0.7 mg/dL (ref 0.3–1.2)
Total Protein: 7.1 g/dL (ref 6.5–8.1)

## 2022-12-31 LAB — ETHANOL: Alcohol, Ethyl (B): <10 mg/dL (ref <10)

## 2022-12-31 LAB — MAGNESIUM: Magnesium: 2 mg/dL (ref 1.7–2.4)

## 2022-12-31 LAB — ACETAMINOPHEN LEVEL
Acetaminophen (Tylenol), Serum: <10 ug/mL — ABNORMAL LOW (ref 10–30)
Acetaminophen (Tylenol), Serum: <10 ug/mL — ABNORMAL LOW (ref 10–30)

## 2022-12-31 LAB — I-STAT BETA HCG BLOOD, ED (MC, WL, AP ONLY): I-stat hCG, quantitative: <5 m[IU]/mL (ref <5)

## 2022-12-31 LAB — SALICYLATE LEVEL
Salicylate Lvl: <7 mg/dL — ABNORMAL LOW (ref 7.0–30.0)
Salicylate Lvl: <7 mg/dL — ABNORMAL LOW (ref 7.0–30.0)

## 2022-12-31 LAB — SARS CORONAVIRUS 2 BY RT PCR: SARS Coronavirus 2 by RT PCR: NEGATIVE

## 2022-12-31 MED ORDER — ACETAMINOPHEN 325 MG PO TABS: 650.0000 mg | ORAL_TABLET | Freq: Four times a day (QID) | ORAL | Status: DC | PRN

## 2022-12-31 MED ORDER — LACTATED RINGERS IV BOLUS: 1000.0000 mL | Freq: Once | INTRAVENOUS | Status: DC

## 2022-12-31 MED ORDER — IBUPROFEN 400 MG PO TABS
600.0000 mg | ORAL_TABLET | Freq: Three times a day (TID) | ORAL | Status: DC | PRN
  Administered 2022-12-31: 600 mg via ORAL
  Filled 2022-12-31: qty 1

## 2022-12-31 NOTE — ED Provider Notes (Signed)
Recheck at 11:30 AM.  Patient is somnolent but arousable answers questions appropriately.  It is been 10 hours since her ingestion and she is medically cleared per poison control.  Repeat acetaminophen level and salicylate levels are undetectable.  TTS consult to be placed.  Holding home meds at this time given overdose.   Glynn Octave, MD 12/31/22 [phone]

## 2022-12-31 NOTE — ED Notes (Signed)
Per [place] Poison Control case has been closed per [place].

## 2022-12-31 NOTE — ED Notes (Signed)
Arvilla Market NP assessing pt at this time

## 2022-12-31 NOTE — Progress Notes (Signed)
Patient has been denied by [facility] due to no appropriate beds available. Patient meets [facility] inpatient criteria per Ophelia Shoulder, NP. Patient has been faxed out to the following facilities:   [facility]  [address], [place] [address] [phone]  [facility] [address]  [address], [place] [address] [phone]  [facility]  [address] [place] [address] [phone]  [facility]  [address] [place] [address] [phone]  [facility]  [address] [place] [address] [phone]  [facility]  [address] [place] [address] [phone]  [facility]  [address] [place] [address] [phone]  [facility]  [address] [place] [address] [phone]  [facility]  [address], [place] [address] [phone]  [facility]  [address], [place] [address] [phone]  [facility]  [address] [place], [place] [address] [phone]  [facility]  [address] [place], [place] [address] [phone]  [facility]  [address] [place] [address] [phone]  [facility]  [address], [place] [address] [phone]  [facility]  [address] [place] [address] [phone]   Damita Dunnings, MSW, LCSW-A  7:46 PM 12/31/2022

## 2022-12-31 NOTE — ED Notes (Signed)
PT has been stating that she has been feeling dizzy.Last BP was 106/52.

## 2022-12-31 NOTE — Consult Note (Signed)
Telepsych Consultation   Reason for Consult:  Suicide Attempt via pill overdose Referring Physician:  Glynn Octave, MD  Location of Patient:    [facility] Location of Provider: Other: virtual home office  Patient Identification: Toy Cumpton MRN:  [id number] Principal Diagnosis: Suicide attempt by drug ingestion [facility]) Diagnosis:  Principal Problem:   Suicide attempt by drug ingestion [facility]) Active Problems:   Overdose   Bipolar 2 disorder, major depressive episode (HCC)   PTSD (post-traumatic stress disorder)   GAD (generalized anxiety disorder)   Bipolar disorder, rapid cycling (HCC)   MDD (major depressive disorder)   Bipolar 1 disorder, depressed (HCC)   Total Time spent with patient: 30 minutes  Subjective:   Breion Jeppsen is a 27 y.o. female patient presented to the [facility] emergency department via ambulance after intentional overdose on psychiatric medications, trazodone, hydroxyzine and aripiprazole.  Per records, it's unclear how much she consumed but she does endorse suicide attempt.    HPI:   Patient seen via telepsych by this provider; chart reviewed and consulted with Dr. Lucianne Muss on 12/31/22.  On evaluation Elvi Ludwig is seen laying in bed, hob elevated.  Pt greeted and anticipatory guidance given. She appears tired, her hair is colored red with black roots; she is dressed in hospital scrubs.  Pt has difficulty keeping her eyes open but is cooperative and agrees to the assessment.  Her speech is slow and she speaks in a low tone. She is alert and oriented x4; Reports she 's here because she overdosed on pills last night with the intent of ending her life.  Pt states she was suicidal at that time but no longer wants to end her life.  She reports she was triggered by an argument with her kid's father.  Pt states he told her something upsetting but she does not elaborate on content.  Pt  reports she has 2 children who are currently in DSS custody.  Pt reports limited  social support and does not name protective factors against self harm.   Pt was recently admitted to [facility] one week prior for similar symptoms.  She reports she was "doing better" at the time of discharge, was and was medication compliant.  She states the argument yesterday was hurtful and she was unable to use coping skills.    Labs: CMP is within normal limits with exception of elevated blood sugar at 101mg /dl but do not think she was fasting CBC is within normal limits UDS is negative EKG shows borderline prolonged QT/QTC intervals of 486/486 with NSR On admission poison control was contacted; pt was observed for 10 hrs as recommended, acetaminophen levels were within normal limits at <7.  Per documentation she is medically cleared.    During evaluation Zyanne Fackelman is laying in bed, HOB elevated; she is alert/oriented x 4; but appears tired, and sad, frequently closes her eyes during assessment but answers questions and is cooperative;  and mood congruent with affect.  Patient speech is clear. Slow rate and low tone congruent with her reports of being "tired." Her eye contact is minimum.  Her thought process is coherent.  There is no indication that she is currently responding to internal/external stimuli or experiencing delusional thought content.  Patient attempted suicide via pill overdose but currently denies SI.  She denies homicidal ideation, psychosis, and paranoia.  Patient has remained cooperative throughout assessment and has answered questions appropriately.     Per ED Provider Admission Assessment  History   Chief complaint:  Drug overdose   Aleria Alexanian is a 27 y.o. female.   The history is provided by the EMS personnel and the patient.  She has history of bipolar disorder, asthma and is brought in by ambulance following an intentional drug overdose at home.  She admits to suicidal intent and states she is still feeling suicidal and has had suicidal thoughts for at least  several months.  It is not clear how many pills she took, but she has prescriptions for hydroxyzine, aripiprazole, trazodone.  She admits to crying spells, early morning wakening, anhedonia.  She denies hallucinations.  She denies ethanol and drug use.  Past Psychiatric History: as outlined in HPI;  Pt has hx for bipolar 1 disorder, mixed, GAD, MDD, intentional drug overdose and prior suicide attempts.  She was recently discharged from [facility] on 12/24/2022 for bipolar mood and depressive symptoms.  Risk to Self:  yes Risk to Others:  no Prior Inpatient Therapy: yes  Prior Outpatient Therapy:  yes  Past Medical History:  Past Medical History:  Diagnosis Date   Asthma    Bipolar 1 disorder, mixed (HCC)    Depression    Generalized anxiety disorder    Intentional drug overdose (HCC) 11/03/2020   Low-lying placenta 01/07/2022   Resolved 03/04/22   Relationship dysfunction    Suicide attempt (HCC) 11/02/2020    Past Surgical History:  Procedure Laterality Date   PILONIDAL CYST / SINUS EXCISION  09/11/2013   PILONIDAL CYST EXCISION  05/17/2014   Pilonidal cystectomy with cleft lip   Family History:  Family History  Problem Relation Age of Onset   Asthma Mother    Diabetes Mother    Healthy Mother    Hypertension Father    Asthma Father    Diabetes Father    Healthy Father    Asthma Brother    Hypertension Paternal Uncle    Diabetes Paternal Grandmother    Stroke Paternal Grandfather    Heart disease Paternal Grandfather    Hypertension Paternal Grandfather    Diabetes Paternal Grandfather    Family Psychiatric  History: deferred Social History:  Social History   Substance and Sexual Activity  Alcohol Use Not Currently   Comment: occasional prior to pregnancy     Social History   Substance and Sexual Activity  Drug Use Not Currently   Frequency: 4.0 times per week   Types: Marijuana   Comment: No Delta 9 since February 2023    Social History   Socioeconomic  History   Marital status: Single    Spouse name: Not on file   Number of children: 1   Years of education: 10   Highest education level: 10th grade  Occupational History   Not on file  Tobacco Use   Smoking status: Former    Packs/day: 1.00    Years: 15.00    Additional pack years: 0.00    Total pack years: 15.00    Types: Cigarettes    Quit date: 07/14/2021    Years since quitting: 1.4   Smokeless tobacco: Never   Tobacco comments:    only smoke a "couple" of cigarettes when stressed or anxious, socially with friends per [facility] chart  Vaping Use   Vaping Use: Former   Start date: 08/10/2003   Quit date: 05/04/2021  Substance and Sexual Activity   Alcohol use: Not Currently    Comment: occasional prior to pregnancy   Drug use: Not Currently    Frequency: 4.0 times per week  Types: Marijuana    Comment: No Delta 9 since February 2023   Sexual activity: Not Currently    Partners: Male    Birth control/protection: None  Other Topics Concern   Not on file  Social History Narrative   Not on file   Social Determinants of Health   Financial Resource Strain: Not on file  Food Insecurity: No Food Insecurity (12/20/2022)   Hunger Vital Sign    Worried About Running Out of Food in the Last Year: Never true    Ran Out of Food in the Last Year: Never true  Transportation Needs: No Transportation Needs (12/20/2022)   PRAPARE - Administrator, Civil Service (Medical): No    Lack of Transportation (Non-Medical): No  Physical Activity: Not on file  Stress: Not on file  Social Connections: Not on file   Additional Social History:    Allergies:   Allergies  Allergen Reactions   Ascorbate Rash   Citrus Rash   Coconut Flavor Rash   Lamotrigine Rash   Orange (Diagnostic) Rash   Peach Flavor Rash   Pear Rash   Pineapple Rash    Labs:  Results for orders placed or performed during the hospital encounter of 12/31/22 (from the past 48 hour(s))  Comprehensive  metabolic panel     Status: Abnormal   Collection Time: 12/31/22  1:50 AM  Result Value Ref Range   Sodium 138 135 - 145 mmol/L   Potassium 3.6 3.5 - 5.1 mmol/L   Chloride 103 98 - 111 mmol/L   CO2 23 22 - 32 mmol/L   Glucose, Bld 101 (H) 70 - 99 mg/dL    Comment: Glucose reference range applies only to samples taken after fasting for at least 8 hours.   BUN 10 6 - 20 mg/dL   Creatinine, Ser 1.61 0.44 - 1.00 mg/dL   Calcium 9.5 8.9 - 09.6 mg/dL   Total Protein 7.1 6.5 - 8.1 g/dL   Albumin 4.2 3.5 - 5.0 g/dL   AST 18 15 - 41 U/L   ALT 17 0 - 44 U/L   Alkaline Phosphatase 57 38 - 126 U/L   Total Bilirubin 0.7 0.3 - 1.2 mg/dL   GFR, Estimated >04 >54 mL/min    Comment: (NOTE) Calculated using the CKD-EPI Creatinine Equation (2021)    Anion gap 12 5 - 15    Comment: Performed at [facility], [address], [place], [place] [address]  Ethanol     Status: None   Collection Time: 12/31/22  1:50 AM  Result Value Ref Range   Alcohol, Ethyl (B) <10 <10 mg/dL    Comment: (NOTE) Lowest detectable limit for serum alcohol is 10 mg/dL.  For medical purposes only. Performed at [facility], [address], [place], [place] [address]   Salicylate level     Status: Abnormal   Collection Time: 12/31/22  1:50 AM  Result Value Ref Range   Salicylate Lvl <7.0 (L) 7.0 - 30.0 mg/dL    Comment: Performed at [facility], [address], [place], [place] [address]  Acetaminophen level     Status: Abnormal   Collection Time: 12/31/22  1:50 AM  Result Value Ref Range   Acetaminophen (Tylenol), Serum <10 (L) 10 - 30 ug/mL    Comment: (NOTE) Therapeutic concentrations vary significantly. A range of 10-30 ug/mL  may be an effective concentration for many patients. However, some  are best treated at concentrations outside  of this range. Acetaminophen concentrations >150 ug/mL at 4 hours after ingestion  and >50 ug/mL at 12 hours after ingestion are often associated with  toxic  reactions.  Performed at [facility], [address] [place], [place] [address]   Magnesium     Status: None   Collection Time: 12/31/22  1:50 AM  Result Value Ref Range   Magnesium 2.0 1.7 - 2.4 mg/dL    Comment: Performed at [facility], [address] [place], [place] [address]  I-Stat beta hCG blood, ED     Status: None   Collection Time: 12/31/22  1:56 AM  Result Value Ref Range   I-stat hCG, quantitative <5.0 <5 mIU/mL   Comment 3            Comment:   GEST. AGE      CONC.  (mIU/mL)   <=1 WEEK        5 - 50     2 WEEKS       50 - 500     3 WEEKS       100 - 10,000     4 WEEKS     1,000 - 30,000        FEMALE AND NON-PREGNANT FEMALE:     LESS THAN 5 mIU/mL   Salicylate level     Status: Abnormal   Collection Time: 12/31/22  6:40 AM  Result Value Ref Range   Salicylate Lvl <7.0 (L) 7.0 - 30.0 mg/dL    Comment: Performed at [facility], [address] [name], [place] [address]  Acetaminophen level     Status: Abnormal   Collection Time: 12/31/22  6:40 AM  Result Value Ref Range   Acetaminophen (Tylenol), Serum <10 (L) 10 - 30 ug/mL    Comment: (NOTE) Therapeutic concentrations vary significantly. A range of 10-30 ug/mL  may be an effective concentration for many patients. However, some  are best treated at concentrations outside of this range. Acetaminophen concentrations >150 ug/mL at 4 hours after ingestion  and >50 ug/mL at 12 hours after ingestion are often associated with  toxic reactions.  Performed at [facility], [address] [place], [place] [address]   CBC with Differential     Status: None   Collection Time: 12/31/22  6:40 AM  Result Value Ref Range   WBC 5.7 4.0 - 10.5 K/uL   RBC 4.24 3.87 - 5.11 MIL/uL   Hemoglobin 12.5 12.0 - 15.0 g/dL   HCT 28.4 13.2 - 44.0 %   MCV 89.6 80.0 - 100.0 fL   MCH 29.5 26.0 - 34.0 pg   MCHC 32.9 30.0 - 36.0 g/dL   RDW 10.2 72.5 - 36.6 %   Platelets 222 150 - 400 K/uL   nRBC 0.0 0.0 -  0.2 %   Neutrophils Relative % 47 %   Neutro Abs 2.8 1.7 - 7.7 K/uL   Lymphocytes Relative 40 %   Lymphs Abs 2.3 0.7 - 4.0 K/uL   Monocytes Relative 10 %   Monocytes Absolute 0.5 0.1 - 1.0 K/uL   Eosinophils Relative 2 %   Eosinophils Absolute 0.1 0.0 - 0.5 K/uL   Basophils Relative 1 %   Basophils Absolute 0.0 0.0 - 0.1 K/uL   Immature Granulocytes 0 %   Abs Immature Granulocytes 0.01 0.00 - 0.07 K/uL    Comment: Performed at [facility], [address], [place], [place] [address]  Urinalysis,  Routine w reflex microscopic -Urine, Clean Catch     Status: Abnormal   Collection Time: 12/31/22 10:55 AM  Result Value Ref Range   Color, Urine AMBER (A) YELLOW    Comment: BIOCHEMICALS MAY BE AFFECTED BY COLOR   APPearance TURBID (A) CLEAR   Specific Gravity, Urine 1.018 1.005 - 1.030   pH 5.0 5.0 - 8.0   Glucose, UA NEGATIVE NEGATIVE mg/dL   Hgb urine dipstick NEGATIVE NEGATIVE   Bilirubin Urine NEGATIVE NEGATIVE   Ketones, ur NEGATIVE NEGATIVE mg/dL   Protein, ur 30 (A) NEGATIVE mg/dL   Nitrite NEGATIVE NEGATIVE   Leukocytes,Ua MODERATE (A) NEGATIVE   RBC / HPF 11-20 0 - 5 RBC/hpf   WBC, UA >50 0 - 5 WBC/hpf   Bacteria, UA MANY (A) NONE SEEN   Squamous Epithelial / HPF >50 0 - 5 /HPF   Mucus PRESENT    Non Squamous Epithelial 0-5 (A) NONE SEEN    Comment: Performed at [facility], [address], [place], [place] [address]  Urine rapid drug screen (hosp performed)     Status: None   Collection Time: 12/31/22 10:55 AM  Result Value Ref Range   Opiates NONE DETECTED NONE DETECTED   Cocaine NONE DETECTED NONE DETECTED   Benzodiazepines NONE DETECTED NONE DETECTED   Amphetamines NONE DETECTED NONE DETECTED   Tetrahydrocannabinol NONE DETECTED NONE DETECTED   Barbiturates NONE DETECTED NONE DETECTED    Comment: (NOTE) DRUG SCREEN FOR MEDICAL PURPOSES ONLY.  IF CONFIRMATION IS NEEDED FOR ANY PURPOSE, NOTIFY LAB WITHIN 5 DAYS.  LOWEST DETECTABLE LIMITS FOR URINE  DRUG SCREEN Drug Class                     Cutoff (ng/mL) Amphetamine and metabolites    1000 Barbiturate and metabolites    200 Benzodiazepine                 200 Opiates and metabolites        300 Cocaine and metabolites        300 THC                            50 Performed at [facility], [address] [place], [place] [address]     Medications:  No current facility-administered medications for this encounter.   Current Outpatient Medications  Medication Sig Dispense Refill   ARIPiprazole (ABILIFY) 5 MG tablet Take 1 tablet (5 mg total) by mouth at bedtime. 30 tablet 0   gabapentin (NEURONTIN) 100 MG capsule Take 300 mg by mouth 3 (three) times daily.     hydrOXYzine (ATARAX) 25 MG tablet Take 1 tablet (25 mg total) by mouth 3 (three) times daily as needed for anxiety. 30 tablet 0   traZODone (DESYREL) 50 MG tablet Take 1 tablet (50 mg total) by mouth at bedtime as needed for sleep. 30 tablet 0   nicotine polacrilex (NICORETTE) 2 MG gum Take 1 each (2 mg total) by mouth as needed for smoking cessation. (Patient not taking: Reported on 12/31/2022) 100 tablet 0    Musculoskeletal: pt moves all extremities and ambulates independently.  Strength & Muscle Tone: within normal limits Gait & Station: normal Patient leans: N/A   Psychiatric Specialty Exam:  Presentation  General Appearance:  Fairly Groomed; Appropriate for Environment  Eye Contact: Minimal  Speech: Slow  Speech Volume: Decreased  Handedness: Right   Mood and  Affect  Mood: Depressed  Affect: Congruent; Depressed; Blunt   Thought Process  Thought Processes: Coherent  Descriptions of Associations:Intact  Orientation:Full (Time, Place and Person)  Thought Content:Logical (in that she wanted to end her life)  History of Schizophrenia/Schizoaffective disorder:No  Duration of Psychotic Symptoms:N/A  Hallucinations:Hallucinations: None  Ideas of Reference:None  Suicidal  Thoughts:Suicidal Thoughts: Yes, Active SI Active Intent and/or Plan: With Intent; With Plan; With Means to Carry Out; With Access to Means  Homicidal Thoughts:Homicidal Thoughts: No   Sensorium  Memory: Immediate Good; Recent Good; Remote Good  Judgment: Poor  Insight: Poor   Executive Functions  Concentration: Fair  Attention Span: Fair  Recall: Good  Fund of Knowledge: Fair  Language: Good   Psychomotor Activity  Psychomotor Activity:Psychomotor Activity: Normal   Assets  Assets: Communication Skills; Desire for Improvement; Financial Resources/Insurance   Sleep  Sleep:Sleep: Fair Number of Hours of Sleep: 4    Physical Exam: Physical Exam Cardiovascular:     Rate and Rhythm: Normal rate.     Pulses: Normal pulses.  Pulmonary:     Effort: Pulmonary effort is normal.  Musculoskeletal:        General: Normal range of motion.     Cervical back: Normal range of motion.  Neurological:     Mental Status: She is alert and oriented to person, place, and time.  Psychiatric:        Attention and Perception: She is inattentive (pt participates in interview and answers questions but appears tired).        Mood and Affect: Mood is depressed. Affect is blunt.        Speech: Speech is delayed.        Behavior: Behavior is slowed. Behavior is cooperative.        Thought Content: Thought content is not paranoid or delusional. Thought content includes suicidal ideation. Thought content does not include homicidal ideation. Thought content includes suicidal plan. Thought content does not include homicidal plan.        Cognition and Memory: Cognition and memory normal.        Judgment: Judgment is impulsive and inappropriate.    Review of Systems  Constitutional: Negative.   HENT: Negative.    Eyes: Negative.   Respiratory: Negative.    Cardiovascular: Negative.   Gastrointestinal: Negative.   Genitourinary: Negative.   Musculoskeletal: Negative.   Skin:  Negative.   Neurological: Negative.   Endo/Heme/Allergies: Negative.   Psychiatric/Behavioral:  Positive for depression and suicidal ideas. Negative for hallucinations and memory loss. The patient is nervous/anxious. The patient does not have insomnia.    Blood pressure (!) 114/53, pulse 77, temperature 98.2 F (36.8 C), temperature source Oral, resp. rate 15, height 5\' 3"  (1.6 m), weight 98.4 kg, SpO2 96 %, not currently breastfeeding. Body mass index is 38.43 kg/m.  Treatment Plan Summary: Pt presents via EMS after intentionally overdosing on psychiatric medications, triggered by an argument with her kids father.  During assessment today, Pt states she intended to end her life, but no longer feels this way.  She has a hx for bipolar 1 disorder, anxiety,marijuana and alcohol usage.  Pt presents sad, depressed and endorses feeling of hopelessness and despair.  Considering her recent attempt, she is a high safety risk and recommended for inpatient admission.  This was discussed with patient who accepts voluntary admission.  Pt meets criteria for IVC should she decide she wants to leave without treatment.    Daily contact with patient to assess  and evaluate symptoms and progress in treatment and Medication management  EKG shows borderline prolonged QT intervals 486/486 with NSR.    Disposition: Recommend psychiatric Inpatient admission when medically cleared.  Spoke with Dr. Sherlie Ban, EDP; Rosey Bath, [facility], [place], RN informed of above recommendation and disposition via secure messaging,  This service was provided via telemedicine using a 2-way, interactive audio and video technology.  Names of all persons participating in this telemedicine service and their role in this encounter. Name: [name] Role: Patient  Name: Ophelia Shoulder Role: PMHNP  Name: Nelly Rout Role: Psychiatrist   Chales Abrahams, NP 12/31/2022 4:46 PM

## 2022-12-31 NOTE — ED Provider Notes (Signed)
EMERGENCY DEPARTMENT AT [facility] Provider Note   CSN: [id number] Arrival date & time: 12/31/22  0141     History  Chief complaint: Drug overdose  Sharon Ward is a 27 y.o. female.  The history is provided by the EMS personnel and the patient.  She has history of bipolar disorder, asthma and is brought in by ambulance following an intentional drug overdose at home.  She admits to suicidal intent and states she is still feeling suicidal and has had suicidal thoughts for at least several months.  It is not clear how many pills she took, but she has prescriptions for hydroxyzine, aripiprazole, trazodone.  She admits to crying spells, early morning wakening, anhedonia.  She denies hallucinations.  She denies ethanol and drug use.   Home Medications Prior to Admission medications   Medication Sig Start Date End Date Taking? Authorizing Provider  ARIPiprazole (ABILIFY) 5 MG tablet Take 1 tablet (5 mg total) by mouth at bedtime. 12/24/22 01/23/23  Massengill, Harrold Donath, MD  hydrOXYzine (ATARAX) 25 MG tablet Take 1 tablet (25 mg total) by mouth 3 (three) times daily as needed for anxiety. 12/24/22 01/23/23  Massengill, Harrold Donath, MD  nicotine polacrilex (NICORETTE) 2 MG gum Take 1 each (2 mg total) by mouth as needed for smoking cessation. 12/24/22   Massengill, Harrold Donath, MD  traZODone (DESYREL) 50 MG tablet Take 1 tablet (50 mg total) by mouth at bedtime as needed for sleep. 12/24/22   Massengill, Harrold Donath, MD      Allergies    Ascorbate, Citrus, Coconut flavor, Lamotrigine, Orange (diagnostic), Peach flavor, Pear, and Pineapple    Review of Systems   Review of Systems  All other systems reviewed and are negative.   Physical Exam Updated Vital Signs BP 112/69   Pulse (!) 56   Temp 97.9 F (36.6 C) (Oral)   Resp 19   SpO2 97%  Physical Exam Vitals and nursing note reviewed.   27 year old female, resting comfortably and in no acute distress. Vital signs are significant  for borderline slow heart rate. Oxygen saturation is 97%, which is normal. Head is normocephalic and atraumatic. PERRLA, EOMI. Oropharynx is clear. Neck is nontender and supple without adenopathy. Lungs are clear without rales, wheezes, or rhonchi. Chest is nontender. Heart has regular rate and rhythm without murmur. Abdomen is soft, flat, nontender. Extremities have no cyanosis or edema, full range of motion is present. Skin is warm and dry without rash. Neurologic: Awake and alert, cranial nerves are intact, moves all extremities equally. Psychiatric: Depressed affect.  Makes poor eye contact and speaks slowly and with a monotone.  ED Results / Procedures / Treatments   Labs (all labs ordered are listed, but only abnormal results are displayed) Labs Reviewed  COMPREHENSIVE METABOLIC PANEL - Abnormal; Notable for the following components:      Result Value   Glucose, Bld 101 (*)    All other components within normal limits  SALICYLATE LEVEL - Abnormal; Notable for the following components:   Salicylate Lvl <7.0 (*)    All other components within normal limits  ACETAMINOPHEN LEVEL - Abnormal; Notable for the following components:   Acetaminophen (Tylenol), Serum <10 (*)    All other components within normal limits  ETHANOL  MAGNESIUM  URINALYSIS, ROUTINE W REFLEX MICROSCOPIC  RAPID URINE DRUG SCREEN, HOSP PERFORMED  ACETAMINOPHEN LEVEL  SALICYLATE LEVEL  I-STAT BETA HCG BLOOD, ED ([facility], [facility], [facility] ONLY)    EKG EKG Interpretation  Date/Time:  Friday  Dec 31 2022 01:46:25 EDT Ventricular Rate:  60 PR Interval:  159 QRS Duration: 96 QT Interval:  447 QTC Calculation: 447 R Axis:   21 Text Interpretation: Sinus rhythm Normal ECG When compared with ECG of 12/19/2022, No significant change was found Confirmed by Dione Booze ([id number]) on 12/31/2022 1:56:49 AM  Procedures Procedures  Cardiac monitor shows normal sinus rhythm, per my interpretation.  Medications Ordered in  ED Medications - No data to display  ED Course/ Medical Decision Making/ A&P                             Medical Decision Making Amount and/or Complexity of Data Reviewed Labs: ordered.   Intentional drug overdose with suicidal intent.  I have ordered screening labs of CBC, comprehensive metabolic panel, ethanol level, salicylate level, acetaminophen level, magnesium, urine drug screen.  Poison control has been consulted and recommends close monitoring for 10 hours with main concern being sedation.  I have reviewed and interpreted her electrocardiogram, and my interpretation is normal ECG with normal QT interval.  Poison control recommends repeat ECG in 6 hours and repeat acetaminophen and salicylate levels in 4 hours.  I have reviewed her past records, and she was seen in emergency on 10/16/2022 with an intentional drug overdose, admitted to [facility] behavioral health from 10/18/2022 through 10/21/2022.  Patient currently states that she is agreeable to voluntary admission.  However, should she change her mind, she would need to be involuntarily committed.  6:17 AM Patient has been observed and remains hemodynamically stable, no signs of toxicity to this point.  I have reviewed and interpreted her laboratory tests, my interpretation is minimally elevated random glucose level, undetectable ethanol and salicylate and acetaminophen, normal magnesium.  Repeat salicylate and acetaminophen levels have been ordered.  She will be medically cleared if no signs of toxicity by 11:30 AM.  Case is signed out to Dr. Manus Gunning, oncoming physician.  Final Clinical Impression(s) / ED Diagnoses Final diagnoses:  Intentional drug overdose, initial encounter [facility])    Rx / DC Orders ED Discharge Orders     None         Dione Booze, MD 12/31/22 [phone]

## 2022-12-31 NOTE — ED Notes (Addendum)
Poison Control contacted by this RN. Per their recommendations, and based upon the medications she ingested: - watch for prolonged QT interval - repeat EKG and Tylenol level q4h - lethargy and hypotension is to be expected - observe for 10hrs

## 2022-12-31 NOTE — ED Provider Notes (Signed)
9:09 PM Patient is complaining of some dizziness and her BP is 90/50. Has had some BPs in 90s during her 19 hour stay on chart review. Will get some basic labs to ensure no change, give some fluids.  10:02 PM I went to examine patient. No labs had been obtained and no fluids given. HR better. Normal strength in all 4 extremities and normal finger to nose. She is now asymptomatic. Unclear what happened, but will hold off on labs/fluids for now.   Pricilla Loveless, MD 12/31/22 2203

## 2022-12-31 NOTE — ED Notes (Signed)
Pt's valuables inventoried and placed in Purple zone locker small locker #3. Medications taken to pharmacy and stored.
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
## 2023-01-01 ENCOUNTER — Inpatient Hospital Stay (HOSPITAL_COMMUNITY)
Admission: EM | Admit: 2023-01-01 | Discharge: 2023-01-06 | DRG: 885 | Disposition: A | Discharge status: Home / Self Care | Payer: Medicaid Other | Source: Intra-hospital | Attending: Psychiatry | Admitting: Psychiatry

## 2023-01-01 ENCOUNTER — Other Ambulatory Visit: Payer: Self-pay

## 2023-01-01 ENCOUNTER — Encounter (HOSPITAL_COMMUNITY): Payer: Self-pay | Admitting: Adult Health

## 2023-01-01 DIAGNOSIS — Z87730 Personal history of (corrected) cleft lip and palate: Secondary | ICD-10-CM | POA: Diagnosis not present

## 2023-01-01 DIAGNOSIS — J45909 Unspecified asthma, uncomplicated: Secondary | ICD-10-CM | POA: Diagnosis present

## 2023-01-01 DIAGNOSIS — Z9151 Personal history of suicidal behavior: Secondary | ICD-10-CM

## 2023-01-01 DIAGNOSIS — F6089 Other specific personality disorders: Secondary | ICD-10-CM | POA: Diagnosis present

## 2023-01-01 DIAGNOSIS — F4010 Social phobia, unspecified: Secondary | ICD-10-CM | POA: Diagnosis present

## 2023-01-01 DIAGNOSIS — F4310 Post-traumatic stress disorder, unspecified: Secondary | ICD-10-CM | POA: Diagnosis present

## 2023-01-01 DIAGNOSIS — Z79899 Other long term (current) drug therapy: Secondary | ICD-10-CM | POA: Diagnosis not present

## 2023-01-01 DIAGNOSIS — F411 Generalized anxiety disorder: Secondary | ICD-10-CM | POA: Diagnosis present

## 2023-01-01 DIAGNOSIS — F332 Major depressive disorder, recurrent severe without psychotic features: Secondary | ICD-10-CM | POA: Diagnosis not present

## 2023-01-01 DIAGNOSIS — F3181 Bipolar II disorder: Secondary | ICD-10-CM | POA: Diagnosis present

## 2023-01-01 DIAGNOSIS — F329 Major depressive disorder, single episode, unspecified: Secondary | ICD-10-CM | POA: Diagnosis present

## 2023-01-01 DIAGNOSIS — Z87891 Personal history of nicotine dependence: Secondary | ICD-10-CM | POA: Diagnosis not present

## 2023-01-01 DIAGNOSIS — F331 Major depressive disorder, recurrent, moderate: Secondary | ICD-10-CM | POA: Diagnosis not present

## 2023-01-01 DIAGNOSIS — T50902A Poisoning by unspecified drugs, medicaments and biological substances, intentional self-harm, initial encounter: Secondary | ICD-10-CM | POA: Diagnosis present

## 2023-01-01 DIAGNOSIS — R45851 Suicidal ideations: Secondary | ICD-10-CM | POA: Diagnosis not present

## 2023-01-01 DIAGNOSIS — F3130 Bipolar disorder, current episode depressed, mild or moderate severity, unspecified: Secondary | ICD-10-CM | POA: Diagnosis not present

## 2023-01-01 DIAGNOSIS — Z1152 Encounter for screening for COVID-19: Secondary | ICD-10-CM | POA: Diagnosis not present

## 2023-01-01 DIAGNOSIS — T1491XA Suicide attempt, initial encounter: Secondary | ICD-10-CM | POA: Diagnosis not present

## 2023-01-01 MED ORDER — TRAZODONE HCL 50 MG PO TABS
50.0000 mg | ORAL_TABLET | Freq: Every evening | ORAL | Status: DC | PRN
  Administered 2023-01-01 – 2023-01-05 (×5): 50 mg via ORAL
  Filled 2023-01-01 (×5): qty 1

## 2023-01-01 MED ORDER — IBUPROFEN 600 MG PO TABS: 600.0000 mg | ORAL_TABLET | Freq: Three times a day (TID) | ORAL | Status: DC | PRN

## 2023-01-01 MED ORDER — ALUM & MAG HYDROXIDE-SIMETH 200-200-20 MG/5ML PO SUSP: 30.0000 mL | ORAL | Status: DC | PRN

## 2023-01-01 MED ORDER — DIPHENHYDRAMINE HCL 50 MG/ML IJ SOLN: 50.0000 mg | Freq: Three times a day (TID) | INTRAMUSCULAR | Status: DC | PRN

## 2023-01-01 MED ORDER — ZIPRASIDONE MESYLATE 20 MG IM SOLR: 10.0000 mg | Freq: Three times a day (TID) | INTRAMUSCULAR | Status: DC | PRN

## 2023-01-01 MED ORDER — GABAPENTIN 300 MG PO CAPS
300.0000 mg | ORAL_CAPSULE | Freq: Three times a day (TID) | ORAL | Status: DC
  Administered 2023-01-02 – 2023-01-06 (×13): 300 mg via ORAL
  Filled 2023-01-01 (×17): qty 1

## 2023-01-01 MED ORDER — LORAZEPAM 1 MG PO TABS: 1.0000 mg | ORAL_TABLET | Freq: Three times a day (TID) | ORAL | Status: DC | PRN

## 2023-01-01 MED ORDER — ACETAMINOPHEN 325 MG PO TABS: 650.0000 mg | ORAL_TABLET | Freq: Four times a day (QID) | ORAL | Status: DC | PRN

## 2023-01-01 MED ORDER — LORAZEPAM 2 MG/ML IJ SOLN: 2.0000 mg | Freq: Three times a day (TID) | INTRAMUSCULAR | Status: DC | PRN

## 2023-01-01 MED ORDER — NICOTINE 14 MG/24HR TD PT24
14.0000 mg | MEDICATED_PATCH | Freq: Every day | TRANSDERMAL | Status: DC
  Filled 2023-01-01 (×3): qty 1

## 2023-01-01 MED ORDER — LORAZEPAM 1 MG PO TABS: 2.0000 mg | ORAL_TABLET | Freq: Three times a day (TID) | ORAL | Status: DC | PRN

## 2023-01-01 MED ORDER — MAGNESIUM HYDROXIDE 400 MG/5ML PO SUSP: 30.0000 mL | Freq: Every day | ORAL | Status: DC | PRN

## 2023-01-01 MED ORDER — DIPHENHYDRAMINE HCL 25 MG PO CAPS: 50.0000 mg | ORAL_CAPSULE | Freq: Three times a day (TID) | ORAL | Status: DC | PRN

## 2023-01-01 NOTE — ED Notes (Signed)
Patient becoming agitated because transport is taking too long. Patient informed that calls have been made for transport and we are awaiting a response for an ETA of when transport will arrive. Patient became agitated and walked back into room and started to push table against the wall, slammed the door, swearing at staff. Patient informed that this behavior would not be tolerated moving forward. Patient is now resting in bed, patient was asked if there was anything we can do to help her calm down at this time, patient states "just get the fucking transport here and get me out of this room." Patient informed again that transport is based on the sheriff office and they have not returned our call yet to give [place] an ETA for transport.

## 2023-01-01 NOTE — ED Notes (Signed)
Patient asked RN what facility she would be going to. Patient was informed that she has a bed at [facility] in [place], [place]. Patient stating that she does not want to go to this facility and would prefer to stay in the [place] area. Patient now stating that she is "pissed off" about going to this facility and pacing around room, stating that she is anxious. Patient informed that social work would be contacted to inquire about placement. [facility] Social work contacted, they informed me that they have a meeting at 0900 and will discuss this patient's case during this time.

## 2023-01-01 NOTE — ED Notes (Signed)
Patient currently calm and cooperative, resting in bed. Provided with snack.

## 2023-01-01 NOTE — ED Provider Notes (Signed)
Patient was accepted to [facility] for inpatient psychiatric admission.  When informed of this, she had worsening anxiety and impulsive behavior.  She remains suicidal.  IVC paperwork was completed.   Gloris Manchester, MD 01/01/23 [phone]

## 2023-01-01 NOTE — Progress Notes (Signed)
[facility] LCSW Progress Note   01/01/2023    10:37 AM  Sharon Ward   [id number]   Type of Contact and Topic:  Psychiatric Bed Placement   Pt accepted to [facility] 402-02   Patient meets inpatient criteria per Ophelia Shoulder, NP  The attending provider will be Dr. Sherron Flemings  Call report to [phone]    [name], RN @ [facility] ED notified.     Pt scheduled  to arrive at [facility] TODAY, the bed is currently available.    Damita Dunnings, MSW, LCSW-A  10:39 AM 01/01/2023

## 2023-01-01 NOTE — ED Notes (Signed)
Called safe transport to transport patient to [facility]. Will be after shift change at 7am.

## 2023-01-01 NOTE — ED Notes (Signed)
IVC completed EXP: 01/08/23  [place] magistrate folder, 1 copy labeled in medrec, 3 copies on clipboard in purple with pt

## 2023-01-01 NOTE — ED Notes (Addendum)
Pt hypotensive earlier in shift upon waking. MD notified & orders placed. Pt given PO fluids with improvement in V/S. Stated her B/P is always very low when she wakes up. Pt remains stable throughout shift. Pt accepted to [facility], Safe Transport requested.

## 2023-01-01 NOTE — ED Provider Notes (Signed)
Emergency Medicine Observation Re-evaluation Note  Sharon Ward is a 27 y.o. female, seen on rounds today.  Pt initially presented to the ED for complaints of Suicide Attempt (Patient to ED via EMS with complaint of suicide attempt by taking according to patient and EMS 52 trazodone, 52 hydroxyzine and 52 gabapentin. Patient reports desire to kill herself. Patient reports recently attempting suicide within a month.) Currently, the patient is resting, no distress became anxious earlier when told that she was going to be admitted to inpatient psychiatric facility and IVC paperwork was completed.Marland Kitchen  Physical Exam  BP (!) 110/50 (BP Location: Left Arm)   Pulse 80   Temp 97.8 F (36.6 C) (Oral)   Resp 15   Ht 5\' 3"  (1.6 m)   Wt 98.4 kg   SpO2 98%   BMI 38.43 kg/m  Physical Exam General: Cooperative Cardiac: Well-perfused Lungs: No increased work of breathing Psych: Calm  ED Course / MDM  EKG:EKG Interpretation  Date/Time:  Friday Dec 31 2022 06:59:33 EDT Ventricular Rate:  60 PR Interval:  171 QRS Duration: 97 QT Interval:  486 QTC Calculation: 486 R Axis:   16 Text Interpretation: Sinus rhythm Borderline prolonged QT interval QT slightly longer Confirmed by Glynn Octave [phone]) on 12/31/2022 7:36:40 AM  I have reviewed the labs performed to date as well as medications administered while in observation.  Recent changes in the last 24 hours include accepted inpatient psychiatric facility.  Plan  Current plan is for psychiatric placement today.  IVC paperwork completed.Glynn Octave, MD 01/01/23 1121

## 2023-01-01 NOTE — ED Notes (Signed)
Sheriff [employer] called Data processing manager for [employer]

## 2023-01-02 DIAGNOSIS — F331 Major depressive disorder, recurrent, moderate: Secondary | ICD-10-CM | POA: Diagnosis not present

## 2023-01-02 MED ORDER — QUETIAPINE FUMARATE 50 MG PO TABS
50.0000 mg | ORAL_TABLET | Freq: Every day | ORAL | Status: DC
  Administered 2023-01-02 – 2023-01-05 (×4): 50 mg via ORAL
  Filled 2023-01-02 (×7): qty 1

## 2023-01-02 MED ORDER — DULOXETINE HCL 30 MG PO CPEP
30.0000 mg | ORAL_CAPSULE | Freq: Every day | ORAL | Status: DC
  Administered 2023-01-03 – 2023-01-04 (×2): 30 mg via ORAL
  Filled 2023-01-02 (×4): qty 1

## 2023-01-02 MED ORDER — SODIUM CHLORIDE 0.9 % IN NEBU
INHALATION_SOLUTION | RESPIRATORY_TRACT | Status: AC
  Filled 2023-01-02: qty 6

## 2023-01-02 NOTE — H&P (Signed)
Psychiatric Admission Assessment Adult  Patient Identification: Sharon Ward MRN:  [id number] Date of Evaluation:  01/02/2023 Chief Complaint:  MDD (major depressive disorder) [F32.9] Principal Diagnosis: MDD (major depressive disorder) Diagnosis:  Principal Problem:   MDD (major depressive disorder) Identifying information and reason for admission: The patient is a 27 year old Caucasian female with a long history of bipolar depression and cluster B personality disorder who was seen in the ED after she took an overdose in a suicide attempt.  She continued to have persistent suicidal thoughts and was unable to contract for safety.  She was sent to [facility] behavioral health on an IVC which was upheld. History of Present Illness:  The patient reports that she was recently discharged from this facility 2 weeks ago.  Upon discharge patient was referred back to her ACT team for follow-up.  She reported that she did fairly well with the ACT team follow-ups with [facility] until she started talking to her children's father.  While on the phone with him, she claims that he told her to go out and kill herself.  She decided to take a bunch of pills in a suicidal attempt and was seen in the ED where she remained labile, depressed and unable to contract for safety.  She is also quite concerned about her 2 children who are currently with DSS.  The children are age 67 years and 7 months.  She does have visitation but because she missed several, she was strongly advised to keep her visitations.  She is hoping for a short length of stay so she can go back to visiting the children. On examination today: The patient was seen and evaluated today and an IVC was upheld.  She remains anxious and depressed but is fairly cooperative to the formal mental status examination.  She endorses feeling fairly well after discharge with her regular medications and follow-up with the [facility] ACT team until the event.  She states that she is  hoping to go home by Friday or Saturday because she has visitation next week.  She currently denies any active suicidal or homicidal ideations and she denies any psychosis.  She is contracting for safety and would like to start back on her regular medications which apparently has been helping her.  Today she is denying active SI/HI/AVH.  However she does admit to at least a week long symptoms of depression which apparently got worse after the trigger with the phone call. Formulation: Patient believes that her suicide attempt was impulsive and was triggered by an argument with ex-boyfriend.  She did well with the bipolar type II and close panic disorder with Abilify 5 mg a day however incidental finding of prolonged QTc interval with associated discontinuation of Abilify prior to her admission here.  Based on her symptoms will consider a trial of Cymbalta 30 mg a day and Seroquel 50 mg at night.   Associated Signs/Symptoms: Depression Symptoms:  depressed mood, anhedonia, psychomotor agitation, feelings of worthlessness/guilt, difficulty concentrating, recurrent thoughts of death, suicidal attempt, (Hypo) Manic Symptoms:  Distractibility, Impulsivity, Irritable Mood, Anxiety Symptoms:  Social Anxiety, Psychotic Symptoms:   Denies paranoia. PTSD Symptoms: Negative Total Time spent with patient: 30 minutes  Past Psychiatric History: Patient has a long history of bipolar disorder and depressive symptoms and has had multiple suicidal attempts in the past.  History is well documented in the records.  She was discharged from home [facility] on 12/24/2022  Is the patient at risk to self? Yes.    Has the  patient been a risk to self in the past 6 months? Yes.    Has the patient been a risk to self within the distant past? Yes.    Is the patient a risk to others? No.  Has the patient been a risk to others in the past 6 months? No.  Has the patient been a risk to others within the distant past? No.    [name] Scale:  Flowsheet Row [facility] ([facility]) [facility] from 12/31/2022 in [facility] Emergency Department at [facility] Admission (Discharged) from [facility]  C-SSRS RISK CATEGORY High Risk High Risk High Risk        Prior Inpatient Therapy: Yes.   If yes, describe multiple prior hospitalizations. Prior Outpatient Therapy: Yes.   If yes, describe [facility] ACT team.  Alcohol Screening: 1. How often do you have a drink containing alcohol?: 2 to 3 times a week 2. How many drinks containing alcohol do you have on a typical day when you are drinking?: 5 or 6 3. How often do you have six or more drinks on one occasion?: Daily or almost daily AUDIT-C Score: 9 4. How often during the last year have you found that you were not able to stop drinking once you had started?: Never 5. How often during the last year have you failed to do what was normally expected from you because of drinking?: Never 6. How often during the last year have you needed a first drink in the morning to get yourself going after a heavy drinking session?: Never 7. How often during the last year have you had a feeling of guilt of remorse after drinking?: Never 8. How often during the last year have you been unable to remember what happened the night before because you had been drinking?: Never 9. Have you or someone else been injured as a result of your drinking?: No 10. Has a relative or friend or a doctor or another health worker been concerned about your drinking or suggested you cut down?: No Alcohol Use Disorder Identification Test Final Score (AUDIT): 9 Alcohol Brief Interventions/Follow-up: Alcohol education/Brief advice Substance Abuse History in the last 12 months:  No.  But positive for marijuana. Consequences of Substance Abuse: Negative Previous Psychotropic Medications: Yes  Psychological  Evaluations: No  Past Medical History:  Past Medical History:  Diagnosis Date   Asthma    Bipolar 1 disorder, mixed (HCC)    Depression    Generalized anxiety disorder    Intentional drug overdose (HCC) 11/03/2020   Low-lying placenta 01/07/2022   Resolved 03/04/22   Relationship dysfunction    Suicide attempt (HCC) 11/02/2020    Past Surgical History:  Procedure Laterality Date   PILONIDAL CYST / SINUS EXCISION  09/11/2013   PILONIDAL CYST EXCISION  05/17/2014   Pilonidal cystectomy with cleft lip   Family History:  Family History  Problem Relation Age of Onset   Asthma Mother    Diabetes Mother    Healthy Mother    Hypertension Father    Asthma Father    Diabetes Father    Healthy Father    Asthma Brother    Hypertension Paternal Uncle    Diabetes Paternal Grandmother    Stroke Paternal Grandfather    Heart disease Paternal Grandfather    Hypertension Paternal Grandfather    Diabetes Paternal Grandfather    Family Psychiatric  History: Unknown at this time.  Tobacco Screening:  Social History   Tobacco Use  Smoking Status Former   Packs/day: 1.00   Years: 15.00   Additional pack years: 0.00   Total pack years: 15.00   Types: Cigarettes   Quit date: 07/14/2021   Years since quitting: 1.4  Smokeless Tobacco Never  Tobacco Comments   only smoke a "couple" of cigarettes when stressed or anxious, socially with friends per [facility] chart    BH Tobacco Counseling     Are you interested in Tobacco Cessation Medications?  Yes, implement Nicotene Replacement Protocol Counseled patient on smoking cessation:  Yes Reason Tobacco Screening Not Completed: Patient Refused Screening       Social History:  Social History   Substance and Sexual Activity  Alcohol Use Not Currently   Comment: occasional prior to pregnancy     Social History   Substance and Sexual Activity  Drug Use Not Currently   Frequency: 4.0 times per week   Types: Marijuana   Comment: No Delta 9  since February 2023    Additional Social History:                           Allergies:   Allergies  Allergen Reactions   Ascorbate Rash   Citrus Rash   Coconut Flavor Rash   Lamotrigine Rash   Orange (Diagnostic) Rash   Peach Flavor Rash   Pear Rash   Pineapple Rash   Lab Results:  Results for orders placed or performed during the hospital encounter of 12/31/22 (from the past 48 hour(s))  SARS Coronavirus 2 by RT PCR (hospital order, performed in [facility] hospital lab) *cepheid single result test* Anterior Nasal Swab     Status: None   Collection Time: 12/31/22 10:06 PM   Specimen: Anterior Nasal Swab  Result Value Ref Range   SARS Coronavirus 2 by RT PCR NEGATIVE NEGATIVE    Comment: Performed at [facility], [address] [place], [place] [address]    Blood Alcohol level:  Lab Results  Component Value Date   ETH <10 12/31/2022   ETH <10 12/19/2022    Metabolic Disorder Labs:  Lab Results  Component Value Date   HGBA1C 5.2 10/19/2022   MPG 103 10/19/2022   MPG 97 10/04/2022   Lab Results  Component Value Date   PROLACTIN 48.1 (H) 11/10/2021   Lab Results  Component Value Date   CHOL 190 12/19/2022   TRIG 92 12/19/2022   HDL 65 12/19/2022   CHOLHDL 2.9 12/19/2022   VLDL 18 12/19/2022   LDLCALC 107 (H) 12/19/2022   LDLCALC 101 (H) 10/19/2022    Current Medications: Current Facility-Administered Medications  Medication Dose Route Frequency Provider Last Rate Last Admin   acetaminophen (TYLENOL) tablet 650 mg  650 mg Oral Q6H PRN Massengill, Harrold Donath, MD       acetaminophen (TYLENOL) tablet 650 mg  650 mg Oral Q6H PRN Massengill, Harrold Donath, MD       alum & mag hydroxide-simeth (MAALOX/MYLANTA) 200-200-20 MG/5ML suspension 30 mL  30 mL Oral Q4H PRN Massengill, Harrold Donath, MD       diphenhydrAMINE (BENADRYL) capsule 50 mg  50 mg Oral TID PRN Massengill, Harrold Donath, MD       Or   diphenhydrAMINE (BENADRYL) injection 50 mg  50 mg Intramuscular TID  PRN Massengill, Harrold Donath, MD       ziprasidone (GEODON) injection 10 mg  10 mg Intramuscular TID PRN Phineas Inches, MD  And   diphenhydrAMINE (BENADRYL) injection 50 mg  50 mg Intramuscular TID PRN Massengill, Harrold Donath, MD       gabapentin (NEURONTIN) capsule 300 mg  300 mg Oral TID Phineas Inches, MD   300 mg at 01/02/23 0825   ibuprofen (ADVIL) tablet 600 mg  600 mg Oral Q8H PRN Massengill, Harrold Donath, MD       LORazepam (ATIVAN) tablet 2 mg  2 mg Oral TID PRN Phineas Inches, MD       Or   LORazepam (ATIVAN) injection 2 mg  2 mg Intramuscular TID PRN Massengill, Harrold Donath, MD       LORazepam (ATIVAN) tablet 1 mg  1 mg Oral TID PRN Massengill, Harrold Donath, MD       magnesium hydroxide (MILK OF MAGNESIA) suspension 30 mL  30 mL Oral Daily PRN Massengill, Harrold Donath, MD       nicotine (NICODERM CQ - dosed in mg/24 hours) patch 14 mg  14 mg Transdermal Daily Massengill, Nathan, MD       traZODone (DESYREL) tablet 50 mg  50 mg Oral QHS PRN Phineas Inches, MD   50 mg at 01/01/23 2302   PTA Medications: Medications Prior to Admission  Medication Sig Dispense Refill Last Dose   ARIPiprazole (ABILIFY) 5 MG tablet Take 1 tablet (5 mg total) by mouth at bedtime. 30 tablet 0    gabapentin (NEURONTIN) 100 MG capsule Take 300 mg by mouth 3 (three) times daily.      hydrOXYzine (ATARAX) 25 MG tablet Take 1 tablet (25 mg total) by mouth 3 (three) times daily as needed for anxiety. 30 tablet 0    nicotine polacrilex (NICORETTE) 2 MG gum Take 1 each (2 mg total) by mouth as needed for smoking cessation. (Patient not taking: Reported on 12/31/2022) 100 tablet 0    traZODone (DESYREL) 50 MG tablet Take 1 tablet (50 mg total) by mouth at bedtime as needed for sleep. 30 tablet 0     Musculoskeletal: Strength & Muscle Tone: within normal limits Gait & Station: normal Patient leans: N/A            Psychiatric Specialty Exam:  Presentation  General Appearance:  Casual  Eye  Contact: Fair  Speech: Slow  Speech Volume: Decreased  Handedness: Right   Mood and Affect  Mood: Anxious; Depressed  Affect: Constricted   Thought Process  Thought Processes: Coherent  Duration of Psychotic Symptoms:N/A Past Diagnosis of Schizophrenia or Psychoactive disorder: No  Descriptions of Associations:Intact  Orientation:Full (Time, Place and Person)  Thought Content:Perseveration; Rumination  Hallucinations:Hallucinations: None  Ideas of Reference:None  Suicidal Thoughts:Suicidal Thoughts: Yes, Passive SI Active Intent and/or Plan: With Intent; With Plan  Homicidal Thoughts:Homicidal Thoughts: No   Sensorium  Memory: Immediate Fair; Remote Fair; Recent Fair  Judgment: Fair  Insight: Fair   Chartered certified accountant: Fair  Attention Span: Fair  Recall: [employer] of Knowledge: Fair  Language: Fair   Psychomotor Activity  Psychomotor Activity: Psychomotor Activity: Normal   Assets  Assets: Manufacturing systems engineer; Desire for Improvement   Sleep  Sleep: Sleep: Fair    Physical Exam: Physical Exam Constitutional:      Appearance: Normal appearance.  HENT:     Head: Normocephalic.  Neurological:     Mental Status: She is alert and oriented to person, place, and time. Mental status is at baseline.    Review of Systems  Psychiatric/Behavioral:  Positive for depression and suicidal ideas.   All other systems reviewed and are negative.  Blood pressure 102/60, pulse (!) 104, temperature 98.3 F (36.8 C), temperature source Oral, resp. rate 16, height 5\' 3"  (1.6 m), weight 97.5 kg, SpO2 99 %, not currently breastfeeding. Body mass index is 38.09 kg/m.  Treatment Plan Summary: Daily contact with patient to assess and evaluate symptoms and progress in treatment and Medication management  Observation Level/Precautions:  15 minute checks  Laboratory:   As indicated.  Psychotherapy: Individual and group  therapy.  Medications: Restart home medications that include Gabapentin 300 mg 3 times a day Abilify was held because of prolonged QTc interval.  Current QTc is 486.  But was noted to be as spine was 6. We will begin Seroquel 50 mg at night Cymbalta 30 mg every day Trazodone 50 mg as needed for sleep  Consultations: As indicated  Discharge Concerns: Reestablish contact with the ACT team  Estimated LOS: 5 days  Other:     Physician Treatment Plan for Primary Diagnosis: MDD (major depressive disorder) Long Term Goal(s): Improvement in symptoms so as ready for discharge  Short Term Goals: Ability to verbalize feelings will improve, Ability to disclose and discuss suicidal ideas, and Ability to demonstrate self-control will improve  Physician Treatment Plan for Secondary Diagnosis: Principal Problem:   MDD (major depressive disorder)  Long Term Goal(s): Improvement in symptoms so as ready for discharge  Short Term Goals: Ability to verbalize feelings will improve, Ability to disclose and discuss suicidal ideas, and Compliance with prescribed medications will improve  I certify that inpatient services furnished can reasonably be expected to improve the patient's condition.    Rex Kras, MD 5/26/202411:11 AM

## 2023-01-02 NOTE — Progress Notes (Signed)
MHT gave note to nurse that pt wrote to him expressing her affection for him and giving him her phone number encouraging him to reach out to her.

## 2023-01-02 NOTE — Progress Notes (Signed)
Pt denies suicidal thoughts today. Pt states she regrets overdose, noting "My childs father told me to kill myself so I did it." Pt focused on belly button piercing and requesting saline solution. No signs of infection noted to piercing. Q 15 minute checks ongoing for safety.

## 2023-01-02 NOTE — Progress Notes (Signed)
   01/02/23 0550  15 Minute Checks  Location Bedroom  Visual Appearance Calm  Behavior Sleeping  Sleep (Behavioral Health Patients Only)  Calculate sleep? (Click Yes once per 24 hr at 0600 safety check) Yes  Documented sleep last 24 hours 5.25

## 2023-01-02 NOTE — BHH Group Notes (Signed)
Adult Psychoeducational Group Note  Date:  01/02/2023 Time:  9:22 PM  Group Topic/Focus:  Wrap-Up Group:   The focus of this group is to help patients review their daily goal of treatment and discuss progress on daily workbooks.  Participation Level:  Active  Participation Quality:  Appropriate  Affect:  Appropriate  Cognitive:  Appropriate  Insight: Appropriate  Engagement in Group:  Engaged  Modes of Intervention:  Activity and Discussion  Additional Comments:  Discussed healthy coping skills and transitioned to a karaoke activity.   Osa Craver 01/02/2023, 9:22 PM

## 2023-01-02 NOTE — Group Note (Signed)
Date:  01/02/2023 Time:  1:35 PM  Group Topic/Focus:  Orientation:   The focus of this group is to educate the patient on the purpose and policies of crisis stabilization and provide a format to answer questions about their admission.  The group details unit policies and expectations of patients while admitted.    Participation Level:  Active  Participation Quality:  Appropriate  Affect:  Appropriate  Cognitive:  Appropriate  Insight: Appropriate  Engagement in Group:  Engaged  Modes of Intervention:  Discussion  Additional Comments:     Reymundo Poll 01/02/2023, 1:35 PM
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
## 2023-01-03 ENCOUNTER — Encounter (HOSPITAL_COMMUNITY): Payer: Self-pay

## 2023-01-03 DIAGNOSIS — F331 Major depressive disorder, recurrent, moderate: Secondary | ICD-10-CM | POA: Diagnosis not present

## 2023-01-03 MED ORDER — NICOTINE POLACRILEX 2 MG MT GUM
2.0000 mg | CHEWING_GUM | OROMUCOSAL | Status: DC | PRN
  Administered 2023-01-03 – 2023-01-06 (×5): 2 mg via ORAL
  Filled 2023-01-03: qty 1

## 2023-01-03 NOTE — BHH Group Notes (Signed)
Spirituality Group facilitated by Kathleen Argue, [facility]  Group focused on topic of strength. Group members reflected on what thoughts and feelings emerge when they hear this topic. They then engaged in facilitated dialog around how strength is present in their lives. This dialog focused on representing what strength had been to them in their lives (images and patterns given) and what they saw as helpful in their life now (what they needed / wanted).  Activity drew on narrative framework.  Patient Progress:  Sharon Ward attended group and actively engaged and participated in group conversation. She shared about needing to be strong for her children and also that her children give her strength.

## 2023-01-03 NOTE — Progress Notes (Signed)
   01/03/23 0530  15 Minute Checks  Location Bedroom  Visual Appearance Calm  Behavior Sleeping  Sleep (Behavioral Health Patients Only)  Calculate sleep? (Click Yes once per 24 hr at 0600 safety check) Yes  Documented sleep last 24 hours 8

## 2023-01-03 NOTE — Progress Notes (Signed)
Pt denied SI/HI/AVH this morning. Pt rated her depression a 0/10, anxiety a 0/10, and feelings of hopelessness a 0/10. Pt reports that she slept "good" last night. Pt denies having any physical pain. Pt reports that her goal for today is "to focus on myself and write 5 positive things I like about myself". Pt has been pleasant, calm, and cooperative throughout the shift. RN provided support and encouragement to patient. Pt given scheduled medications as prescribed. Q15 min checks verified for safety. Patient verbally contracts for safety. Patient compliant with medications and treatment plan. Patient is interacting well on the unit. Pt is safe on the unit.   01/03/23 0900  Psych Admission Type (Psych Patients Only)  Admission Status Involuntary  Psychosocial Assessment  Patient Complaints None  Eye Contact Fair  Facial Expression Flat  Affect Appropriate to circumstance  Speech Logical/coherent  Interaction Assertive  Motor Activity Other (Comment) (WDL)  Appearance/Hygiene Unremarkable  Behavior Characteristics Cooperative;Appropriate to situation  Mood Pleasant  Thought Process  Coherency WDL  Content WDL  Delusions None reported or observed  Perception WDL  Hallucination None reported or observed  Judgment Impaired  Confusion None  Danger to Self  Current suicidal ideation? Denies  Agreement Not to Harm Self No  Description of Agreement Pt verbally contracts for safety  Danger to Others  Danger to Others None reported or observed

## 2023-01-03 NOTE — Progress Notes (Signed)
   01/03/23 2000  Psych Admission Type (Psych Patients Only)  Admission Status Involuntary  Psychosocial Assessment  Patient Complaints None  Eye Contact Fair  Facial Expression Animated  Affect Appropriate to circumstance  Speech Logical/coherent  Interaction Assertive  Motor Activity Slow  Appearance/Hygiene Unremarkable  Behavior Characteristics Cooperative;Appropriate to situation  Mood Pleasant  Thought Process  Coherency WDL  Content WDL  Delusions None reported or observed  Perception WDL  Hallucination None reported or observed  Judgment Impaired  Confusion None  Danger to Self  Current suicidal ideation? Denies  Agreement Not to Harm Self Yes  Description of Agreement verbal  Danger to Others  Danger to Others None reported or observed

## 2023-01-03 NOTE — Progress Notes (Signed)
The patient attended the evening group and was appropriate.  
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
## 2023-01-03 NOTE — Group Note (Signed)
Date:  01/03/2023 Time:  11:56 AM  Group Topic/Focus:  Goals Group:   The focus of this group is to help patients establish daily goals to achieve during treatment and discuss how the patient can incorporate goal setting into their daily lives to aide in recovery.    Participation Level:  Active  Participation Quality:  Appropriate  Affect:  Angry  Cognitive:  Alert  Insight: Appropriate  Engagement in Group:  Engaged  Modes of Intervention:  Clarification and Discussion  Additional Comments:  \  Beckie Busing 01/03/2023, 11:56 AM

## 2023-01-03 NOTE — Group Note (Signed)
Recreation Therapy Group Note   Group Topic:Other  Group Date: 01/03/2023 Start Time: 0935 End Time: 1005 Facilitators: Lis Savitt-McCall, LRT,CTRS Location: [address] Dayroom   Goal Area(s) Addresses:  Patient will work together to answer trivia questions.   Patient will be respectful of others throughout activity.  Group Description: Music Trivia.  Patients were partnered up compete in activity.  LRT read trivia questions that covered different styles, types and genres of music.  The team with the highest score wins the game.    Affect/Mood: N/A   Participation Level: Did not attend    Clinical Observations/Individualized Feedback:     Plan: Continue to engage patient in RT group sessions 2-3x/week.   Melitza Metheny-McCall, LRT,CTRS  01/03/2023 12:20 PM

## 2023-01-03 NOTE — Progress Notes (Signed)
Psychiatric progress note  Patient Identification: Sharon Ward MRN:  [id number] Date of Evaluation:  01/03/2023 Chief Complaint:  MDD (major depressive disorder) [F32.9] Principal Diagnosis: MDD (major depressive disorder) Diagnosis:  Principal Problem:   MDD (major depressive disorder)  Reason for admission   The patient is a 27 year old Caucasian female with a long history of bipolar depression and cluster B personality disorder who was seen in the ED after she took an overdose in a suicide attempt.  She continued to have persistent suicidal thoughts and was unable to contract for safety.  She was sent to [facility] behavioral health on an IVC which was upheld.  Chart review from last 24 hours   Staff reports that the patient is compliant with treatment.  She slept 8 hours. She took the scheduled medications as prescribed She received as needed trazodone 50 mg at night   Yesterday, the psychiatry team made the following recommendations:  Begin duloxetine 30 mg a day Gabapentin 300 mg 3 times a day Seroquel 50 mg at night  Information obtained during interview   The patient was seen and evaluated and she was also seen in treatment team.  She continues to minimize symptoms and rates her depression at a 0/10 and anxiety at a 0/10 and denies any feelings of helplessness or hopelessness.  She took an overdose of her medications while on the phone with her children's father and regrets it.  She expressed anxiety about staying beyond Saturday because of fear of losing visitation with her children.  Her 2 children with the DSS and her next visitation is on Saturday.  Apparently she was told that the respect of the reason if she misses those visitations she will not have any further visitations.  She is contracting for safety and would like to be discharge before Saturday.  Formulation: Patient believes that her suicide attempt was impulsive and was triggered by an argument with ex-boyfriend.  She  did well with the bipolar type II and close panic disorder with Abilify 5 mg a day however incidental finding of prolonged QTc interval with associated discontinuation of Abilify prior to her admission here. Patient was started on a trial of Cymbalta 30 mg a day and Seroquel 50 mg at night.  She slept through the night and reports no side effects.   Associated Signs/Symptoms: Depression Symptoms:  depressed mood, anhedonia, psychomotor agitation, feelings of worthlessness/guilt, difficulty concentrating, recurrent thoughts of death, suicidal attempt, (Hypo) Manic Symptoms:  Distractibility, Impulsivity, Irritable Mood, Anxiety Symptoms:  Social Anxiety, Psychotic Symptoms:   Denies paranoia. PTSD Symptoms: Negative Total Time spent with patient: 30 minutes  Past Psychiatric History: Patient has a long history of bipolar disorder and depressive symptoms and has had multiple suicidal attempts in the past.  History is well documented in the records.  She was discharged from home [facility] on 12/24/2022  Is the patient at risk to self? Yes.    Has the patient been a risk to self in the past 6 months? Yes.    Has the patient been a risk to self within the distant past? Yes.    Is the patient a risk to others? No.  Has the patient been a risk to others in the past 6 months? No.  Has the patient been a risk to others within the distant past? No.   [name] Scale:  Flowsheet Row [facility]) [facility] from [facility]31/2022 in [facility]  Admission (Discharged) [facility]  C-SSRS RISK CATEGORY High Risk High Risk High Risk        Prior Inpatient Therapy: Yes.   If yes, describe multiple prior hospitalizations. Prior Outpatient Therapy: Yes.   If yes, describe [facility] ACT team.  Alcohol Screening: 1. How often do you have a drink containing  alcohol?: 2 to 3 times a week 2. How many drinks containing alcohol do you have on a typical day when you are drinking?: 5 or 6 3. How often do you have six or more drinks on one occasion?: Daily or almost daily AUDIT-C Score: 9 4. How often during the last year have you found that you were not able to stop drinking once you had started?: Never 5. How often during the last year have you failed to do what was normally expected from you because of drinking?: Never 6. How often during the last year have you needed a first drink in the morning to get yourself going after a heavy drinking session?: Never 7. How often during the last year have you had a feeling of guilt of remorse after drinking?: Never 8. How often during the last year have you been unable to remember what happened the night before because you had been drinking?: Never 9. Have you or someone else been injured as a result of your drinking?: No 10. Has a relative or friend or a doctor or another health worker been concerned about your drinking or suggested you cut down?: No Alcohol Use Disorder Identification Test Final Score (AUDIT): 9 Alcohol Brief Interventions/Follow-up: Alcohol education/Brief advice Substance Abuse History in the last 12 months:  No.  But positive for marijuana. Consequences of Substance Abuse: Negative Previous Psychotropic Medications: Yes  Psychological Evaluations: No  Past Medical History:  Past Medical History:  Diagnosis Date   Asthma    Bipolar 1 disorder, mixed (HCC)    Depression    Generalized anxiety disorder    Intentional drug overdose (HCC) 11/03/2020   Low-lying placenta 01/07/2022   Resolved 03/04/22   Relationship dysfunction    Suicide attempt (HCC) 11/02/2020    Past Surgical History:  Procedure Laterality Date   PILONIDAL CYST / SINUS EXCISION  09/11/2013   PILONIDAL CYST EXCISION  05/17/2014   Pilonidal cystectomy with cleft lip   Family History:  Family History  Problem  Relation Age of Onset   Asthma Mother    Diabetes Mother    Healthy Mother    Hypertension Father    Asthma Father    Diabetes Father    Healthy Father    Asthma Brother    Hypertension Paternal Uncle    Diabetes Paternal Grandmother    Stroke Paternal Grandfather    Heart disease Paternal Grandfather    Hypertension Paternal Grandfather    Diabetes Paternal Grandfather    Family Psychiatric  History: Unknown at this time. Tobacco Screening:  Social History   Tobacco Use  Smoking Status Former   Packs/day: 1.00   Years: 15.00   Additional pack years: 0.00   Total pack years: 15.00   Types: Cigarettes   Quit date: 07/14/2021   Years since quitting: 1.4  Smokeless Tobacco Never  Tobacco Comments   only smoke a "couple" of cigarettes when stressed or anxious, socially with friends per [facility] chart    BH Tobacco Counseling     Are you interested in Tobacco Cessation Medications?  Yes, implement Nicotene Replacement Protocol Counseled patient  on smoking cessation:  Yes Reason Tobacco Screening Not Completed: Patient Refused Screening       Social History:  Social History   Substance and Sexual Activity  Alcohol Use Not Currently   Comment: occasional prior to pregnancy     Social History   Substance and Sexual Activity  Drug Use Not Currently   Frequency: 4.0 times per week   Types: Marijuana   Comment: No Delta 9 since February 2023    Additional Social History:                           Allergies:   Allergies  Allergen Reactions   Ascorbate Rash   Citrus Rash   Coconut Flavor Rash   Lamotrigine Rash   Orange (Diagnostic) Rash   Peach Flavor Rash   Pear Rash   Pineapple Rash   Lab Results:  No results found for this or any previous visit (from the past 48 hour(s)).   Blood Alcohol level:  Lab Results  Component Value Date   ETH <10 12/31/2022   ETH <10 12/19/2022    Metabolic Disorder Labs:  Lab Results  Component Value Date    HGBA1C 5.2 10/19/2022   MPG 103 10/19/2022   MPG 97 10/04/2022   Lab Results  Component Value Date   PROLACTIN 48.1 (H) 11/10/2021   Lab Results  Component Value Date   CHOL 190 12/19/2022   TRIG 92 12/19/2022   HDL 65 12/19/2022   CHOLHDL 2.9 12/19/2022   VLDL 18 12/19/2022   LDLCALC 107 (H) 12/19/2022   LDLCALC 101 (H) 10/19/2022    Current Medications: Current Facility-Administered Medications  Medication Dose Route Frequency Provider Last Rate Last Admin   acetaminophen (TYLENOL) tablet 650 mg  650 mg Oral Q6H PRN Massengill, Harrold Donath, MD       acetaminophen (TYLENOL) tablet 650 mg  650 mg Oral Q6H PRN Massengill, Harrold Donath, MD       alum & mag hydroxide-simeth (MAALOX/MYLANTA) 200-200-20 MG/5ML suspension 30 mL  30 mL Oral Q4H PRN Massengill, Harrold Donath, MD       diphenhydrAMINE (BENADRYL) capsule 50 mg  50 mg Oral TID PRN Massengill, Harrold Donath, MD       Or   diphenhydrAMINE (BENADRYL) injection 50 mg  50 mg Intramuscular TID PRN Massengill, Harrold Donath, MD       ziprasidone (GEODON) injection 10 mg  10 mg Intramuscular TID PRN Massengill, Harrold Donath, MD       And   diphenhydrAMINE (BENADRYL) injection 50 mg  50 mg Intramuscular TID PRN Massengill, Harrold Donath, MD       DULoxetine (CYMBALTA) DR capsule 30 mg  30 mg Oral Daily Rex Kras, MD   30 mg at 01/03/23 0855   gabapentin (NEURONTIN) capsule 300 mg  300 mg Oral TID Phineas Inches, MD   300 mg at 01/03/23 0855   ibuprofen (ADVIL) tablet 600 mg  600 mg Oral Q8H PRN Massengill, Harrold Donath, MD       LORazepam (ATIVAN) tablet 2 mg  2 mg Oral TID PRN Phineas Inches, MD       Or   LORazepam (ATIVAN) injection 2 mg  2 mg Intramuscular TID PRN Massengill, Harrold Donath, MD       LORazepam (ATIVAN) tablet 1 mg  1 mg Oral TID PRN Massengill, Harrold Donath, MD       magnesium hydroxide (MILK OF MAGNESIA) suspension 30 mL  30 mL Oral Daily PRN Phineas Inches, MD  nicotine polacrilex (NICORETTE) gum 2 mg  2 mg Oral PRN Massengill, Harrold Donath, MD   2 mg at  01/03/23 1032   QUEtiapine (SEROQUEL) tablet 50 mg  50 mg Oral QHS Rex Kras, MD   50 mg at 01/02/23 2113   traZODone (DESYREL) tablet 50 mg  50 mg Oral QHS PRN Phineas Inches, MD   50 mg at 01/02/23 2113   PTA Medications: Medications Prior to Admission  Medication Sig Dispense Refill Last Dose   ARIPiprazole (ABILIFY) 5 MG tablet Take 1 tablet (5 mg total) by mouth at bedtime. 30 tablet 0    gabapentin (NEURONTIN) 100 MG capsule Take 300 mg by mouth 3 (three) times daily.      hydrOXYzine (ATARAX) 25 MG tablet Take 1 tablet (25 mg total) by mouth 3 (three) times daily as needed for anxiety. 30 tablet 0    nicotine polacrilex (NICORETTE) 2 MG gum Take 1 each (2 mg total) by mouth as needed for smoking cessation. (Patient not taking: Reported on 12/31/2022) 100 tablet 0    traZODone (DESYREL) 50 MG tablet Take 1 tablet (50 mg total) by mouth at bedtime as needed for sleep. 30 tablet 0     Musculoskeletal: Strength & Muscle Tone: within normal limits Gait & Station: normal Patient leans: N/A            Psychiatric Specialty Exam:  Presentation  General Appearance:  Casual  Eye Contact: Fair  Speech: Slow  Speech Volume: Decreased  Handedness: Right   Mood and Affect  Mood: Anxious; Depressed  Affect: Restricted   Thought Process  Thought Processes: Linear  Duration of Psychotic Symptoms:N/A Past Diagnosis of Schizophrenia or Psychoactive disorder: No  Descriptions of Associations:Intact  Orientation:Full (Time, Place and Person)  Thought Content:Logical  Hallucinations:Hallucinations: None  Ideas of Reference:None  Suicidal Thoughts:Suicidal Thoughts: No SI Active Intent and/or Plan: With Intent; With Plan  Homicidal Thoughts:Homicidal Thoughts: No   Sensorium  Memory: Immediate Fair; Remote Fair; Recent Fair  Judgment: Fair  Insight: Fair   Chartered certified accountant: Fair  Attention  Span: Fair  Recall: [employer] of Knowledge: Fair  Language: Fair   Psychomotor Activity  Psychomotor Activity: Psychomotor Activity: Normal   Assets  Assets: Manufacturing systems engineer; Desire for Improvement; Housing   Sleep  Sleep: Sleep: Fair    Physical Exam: Physical Exam Constitutional:      Appearance: Normal appearance.  HENT:     Head: Normocephalic.  Neurological:     Mental Status: She is alert and oriented to person, place, and time. Mental status is at baseline.    Review of Systems  Psychiatric/Behavioral:  Positive for depression and suicidal ideas.   All other systems reviewed and are negative.  Blood pressure (!) 95/53, pulse (!) 102, temperature 98 F (36.7 C), temperature source Oral, resp. rate 16, height 5\' 3"  (1.6 m), weight 97.5 kg, SpO2 96 %, not currently breastfeeding. Body mass index is 38.09 kg/m.  Treatment Plan Summary: Daily contact with patient to assess and evaluate symptoms and progress in treatment and Medication management  Observation Level/Precautions:  15 minute checks  Laboratory:   As indicated.  Psychotherapy: Individual and group therapy.  Medications: Restart home medications that include Gabapentin 300 mg 3 times a day Abilify was held because of prolonged QTc interval.  Current QTc is 486 which has gone down from 506. Continue Seroquel 50 mg at night Cymbalta 30 mg every day Trazodone 50 mg as needed for sleep  Consultations:  As indicated  Discharge Concerns: Reestablish contact with the ACT team  Estimated LOS: 5 days  Other:     Physician Treatment Plan for Primary Diagnosis: MDD (major depressive disorder) Long Term Goal(s): Improvement in symptoms so as ready for discharge  Short Term Goals: Ability to verbalize feelings will improve, Ability to disclose and discuss suicidal ideas, and Ability to demonstrate self-control will improve  Physician Treatment Plan for Secondary Diagnosis: Principal Problem:    MDD (major depressive disorder)  Long Term Goal(s): Improvement in symptoms so as ready for discharge  Short Term Goals: Ability to verbalize feelings will improve, Ability to disclose and discuss suicidal ideas, and Compliance with prescribed medications will improve  I certify that inpatient services furnished can reasonably be expected to improve the patient's condition.    Rex Kras, MD 5/27/202411:46 AMPatient ID: Sharon Ward, female   DOB: 04/14/1996, 27 y.o.   MRN: [id number]

## 2023-01-04 DIAGNOSIS — F332 Major depressive disorder, recurrent severe without psychotic features: Secondary | ICD-10-CM | POA: Diagnosis not present

## 2023-01-04 MED ORDER — DULOXETINE HCL 60 MG PO CPEP
60.0000 mg | ORAL_CAPSULE | Freq: Every day | ORAL | Status: DC
  Administered 2023-01-05 – 2023-01-06 (×2): 60 mg via ORAL
  Filled 2023-01-04 (×3): qty 1

## 2023-01-04 NOTE — Progress Notes (Signed)
Psychiatric progress note  Patient Identification: Sharon Ward MRN:  [id number] Date of Evaluation:  01/04/2023 Chief Complaint:  MDD (major depressive disorder) [F32.9] Principal Diagnosis: MDD (major depressive disorder) Diagnosis:  Principal Problem:   MDD (major depressive disorder)  Reason for admission   The patient is a 27 year old Caucasian female with a long history of bipolar depression and cluster B personality disorder who was seen in the ED after she took an overdose in a suicide attempt.  She continued to have persistent suicidal thoughts and was unable to contract for safety.  She was sent to [facility] behavioral health on an IVC which was upheld.  Chart review from last 24 hours   Staff reports that the patient is compliant with treatment.  She slept 8 0.5 hours. She took the scheduled medications as prescribed She received as needed trazodone 50 mg at night  Information obtained during interview   Upon evaluation today patient reports improved mood and depression since admission continues to regret her suicide attempt by overdose describes it as impulsive "I regret doing it" identifies having her kids as a purpose to live noting that her 2 kids may be adopted by her aunt and uncle who are very supportive and she has visitation with them this Saturday and does not want to miss.  She denies any passive or active SI intention or plan, denies HI or AVH, denies side effect of medication and agrees to comply.  Discussed with patient titrating Cymbalta from 30 to 60 mg to target symptom control depression and anxiety, she agrees.  Total Time spent with patient: 35 minutes  Past Psychiatric History: Patient has a long history of bipolar disorder and depressive symptoms and has had multiple suicidal attempts in the past.  History is well documented in the records.  She was discharged from home [facility] on 12/24/2022  Is the patient at risk to self? Yes.    Has the patient been a risk to self  in the past 6 months? Yes.    Has the patient been a risk to self within the distant past? Yes.    Is the patient a risk to others? No.  Has the patient been a risk to others in the past 6 months? No.  Has the patient been a risk to others within the distant past? No.   [name] Scale:  Flowsheet Row [facility]) [facility] from 12/31/2022 in [facility] Department at [facility] Admission (Discharged) from [facility]  C-SSRS RISK CATEGORY High Risk High Risk High Risk        Prior Inpatient Therapy: Yes.   If yes, describe multiple prior hospitalizations. Prior Outpatient Therapy: Yes.   If yes, describe [facility] ACT team.  Alcohol Screening: 1. How often do you have a drink containing alcohol?: 2 to 3 times a week 2. How many drinks containing alcohol do you have on a typical day when you are drinking?: 5 or 6 3. How often do you have six or more drinks on one occasion?: Daily or almost daily AUDIT-C Score: 9 4. How often during the last year have you found that you were not able to stop drinking once you had started?: Never 5. How often during the last year have you failed to do what was normally expected from you because of drinking?: Never 6. How often during the last year have you needed a first drink in the  morning to get yourself going after a heavy drinking session?: Never 7. How often during the last year have you had a feeling of guilt of remorse after drinking?: Never 8. How often during the last year have you been unable to remember what happened the night before because you had been drinking?: Never 9. Have you or someone else been injured as a result of your drinking?: No 10. Has a relative or friend or a doctor or another health worker been concerned about your drinking or suggested you cut down?: No Alcohol Use Disorder Identification Test Final  Score (AUDIT): 9 Alcohol Brief Interventions/Follow-up: Alcohol education/Brief advice Substance Abuse History in the last 12 months:  No.  But positive for marijuana. Consequences of Substance Abuse: Negative Previous Psychotropic Medications: Yes  Psychological Evaluations: No  Past Medical History:  Past Medical History:  Diagnosis Date   Asthma    Bipolar 1 disorder, mixed (HCC)    Depression    Generalized anxiety disorder    Intentional drug overdose (HCC) 11/03/2020   Low-lying placenta 01/07/2022   Resolved 03/04/22   Relationship dysfunction    Suicide attempt (HCC) 11/02/2020    Past Surgical History:  Procedure Laterality Date   PILONIDAL CYST / SINUS EXCISION  09/11/2013   PILONIDAL CYST EXCISION  05/17/2014   Pilonidal cystectomy with cleft lip   Family History:  Family History  Problem Relation Age of Onset   Asthma Mother    Diabetes Mother    Healthy Mother    Hypertension Father    Asthma Father    Diabetes Father    Healthy Father    Asthma Brother    Hypertension Paternal Uncle    Diabetes Paternal Grandmother    Stroke Paternal Grandfather    Heart disease Paternal Grandfather    Hypertension Paternal Grandfather    Diabetes Paternal Grandfather    Family Psychiatric  History: Unknown at this time. Tobacco Screening:  Social History   Tobacco Use  Smoking Status Former   Packs/day: 1.00   Years: 15.00   Additional pack years: 0.00   Total pack years: 15.00   Types: Cigarettes   Quit date: 07/14/2021   Years since quitting: 1.4  Smokeless Tobacco Never  Tobacco Comments   only smoke a "couple" of cigarettes when stressed or anxious, socially with friends per [facility] chart    BH Tobacco Counseling     Are you interested in Tobacco Cessation Medications?  Yes, implement Nicotene Replacement Protocol Counseled patient on smoking cessation:  Yes Reason Tobacco Screening Not Completed: Patient Refused Screening       Social History:   Social History   Substance and Sexual Activity  Alcohol Use Not Currently   Comment: occasional prior to pregnancy     Social History   Substance and Sexual Activity  Drug Use Not Currently   Frequency: 4.0 times per week   Types: Marijuana   Comment: No Delta 9 since February 2023    Additional Social History: Marital status: Single Are you sexually active?: Yes What is your sexual orientation?: Bisexual Has your sexual activity been affected by drugs, alcohol, medication, or emotional stress?: No Does patient have children?: Yes How many children?: 2 How is patient's relationship with their children?: "I have a 10yo and a 47 month old and they live with their foster parent but I see them every Thursday '  Allergies:   Allergies  Allergen Reactions   Ascorbate Rash   Citrus Rash   Coconut Flavor Rash   Lamotrigine Rash   Orange (Diagnostic) Rash   Peach Flavor Rash   Pear Rash   Pineapple Rash   Lab Results:  No results found for this or any previous visit (from the past 48 hour(s)).   Blood Alcohol level:  Lab Results  Component Value Date   ETH <10 12/31/2022   ETH <10 12/19/2022    Metabolic Disorder Labs:  Lab Results  Component Value Date   HGBA1C 5.2 10/19/2022   MPG 103 10/19/2022   MPG 97 10/04/2022   Lab Results  Component Value Date   PROLACTIN 48.1 (H) 11/10/2021   Lab Results  Component Value Date   CHOL 190 12/19/2022   TRIG 92 12/19/2022   HDL 65 12/19/2022   CHOLHDL 2.9 12/19/2022   VLDL 18 12/19/2022   LDLCALC 107 (H) 12/19/2022   LDLCALC 101 (H) 10/19/2022    Current Medications: Current Facility-Administered Medications  Medication Dose Route Frequency Provider Last Rate Last Admin   acetaminophen (TYLENOL) tablet 650 mg  650 mg Oral Q6H PRN Massengill, Harrold Donath, MD       acetaminophen (TYLENOL) tablet 650 mg  650 mg Oral Q6H PRN Massengill, Harrold Donath, MD       alum & mag hydroxide-simeth  (MAALOX/MYLANTA) 200-200-20 MG/5ML suspension 30 mL  30 mL Oral Q4H PRN Massengill, Harrold Donath, MD       diphenhydrAMINE (BENADRYL) capsule 50 mg  50 mg Oral TID PRN Phineas Inches, MD       Or   diphenhydrAMINE (BENADRYL) injection 50 mg  50 mg Intramuscular TID PRN Massengill, Harrold Donath, MD       ziprasidone (GEODON) injection 10 mg  10 mg Intramuscular TID PRN Massengill, Harrold Donath, MD       And   diphenhydrAMINE (BENADRYL) injection 50 mg  50 mg Intramuscular TID PRN Massengill, Harrold Donath, MD       DULoxetine (CYMBALTA) DR capsule 30 mg  30 mg Oral Daily Rex Kras, MD   30 mg at 01/04/23 0800   gabapentin (NEURONTIN) capsule 300 mg  300 mg Oral TID Phineas Inches, MD   300 mg at 01/04/23 1148   ibuprofen (ADVIL) tablet 600 mg  600 mg Oral Q8H PRN Massengill, Harrold Donath, MD       LORazepam (ATIVAN) tablet 2 mg  2 mg Oral TID PRN Phineas Inches, MD       Or   LORazepam (ATIVAN) injection 2 mg  2 mg Intramuscular TID PRN Massengill, Harrold Donath, MD       LORazepam (ATIVAN) tablet 1 mg  1 mg Oral TID PRN Massengill, Harrold Donath, MD       magnesium hydroxide (MILK OF MAGNESIA) suspension 30 mL  30 mL Oral Daily PRN Massengill, Harrold Donath, MD       nicotine polacrilex (NICORETTE) gum 2 mg  2 mg Oral PRN Massengill, Harrold Donath, MD   2 mg at 01/03/23 1032   QUEtiapine (SEROQUEL) tablet 50 mg  50 mg Oral QHS Rex Kras, MD   50 mg at 01/03/23 2100   traZODone (DESYREL) tablet 50 mg  50 mg Oral QHS PRN Phineas Inches, MD   50 mg at 01/03/23 2102   PTA Medications: Medications Prior to Admission  Medication Sig Dispense Refill Last Dose   ARIPiprazole (ABILIFY) 5 MG tablet Take 1 tablet (5 mg total) by mouth at bedtime. 30 tablet 0    gabapentin (NEURONTIN) 100  MG capsule Take 300 mg by mouth 3 (three) times daily.      hydrOXYzine (ATARAX) 25 MG tablet Take 1 tablet (25 mg total) by mouth 3 (three) times daily as needed for anxiety. 30 tablet 0    nicotine polacrilex (NICORETTE) 2 MG gum Take 1 each (2  mg total) by mouth as needed for smoking cessation. (Patient not taking: Reported on 12/31/2022) 100 tablet 0    traZODone (DESYREL) 50 MG tablet Take 1 tablet (50 mg total) by mouth at bedtime as needed for sleep. 30 tablet 0     Musculoskeletal: Strength & Muscle Tone: within normal limits Gait & Station: normal Patient leans: N/A            Psychiatric Specialty Exam:  Presentation  General Appearance:  Casual  Eye Contact: Fair  Speech: Slow  Speech Volume: Decreased  Handedness: Right   Mood and Affect  Mood: Anxious; Depressed  Affect: Restricted   Thought Process  Thought Processes: Linear  Duration of Psychotic Symptoms:N/A Past Diagnosis of Schizophrenia or Psychoactive disorder: No  Descriptions of Associations:Intact  Orientation:Full (Time, Place and Person)  Thought Content:Logical  Hallucinations:Hallucinations: None  Ideas of Reference:None  Suicidal Thoughts:Suicidal Thoughts: No  Homicidal Thoughts:Homicidal Thoughts: No   Sensorium  Memory: Immediate Fair; Remote Fair; Recent Fair  Judgment: Fair  Insight: Fair   Art therapist  Concentration: Fair  Attention Span: Fair  Recall: [employer] of Knowledge: Fair  Language: Fair   Psychomotor Activity  Psychomotor Activity: Psychomotor Activity: Normal   Assets  Assets: Communication Skills; Desire for Improvement; Housing   Sleep  Sleep: Sleep: Fair    Physical Exam: Physical Exam Vitals and nursing note reviewed.    Review of Systems  All other systems reviewed and are negative.  Blood pressure 101/71, pulse 88, temperature 97.8 F (36.6 C), temperature source Oral, resp. rate 16, height 5\' 3"  (1.6 m), weight 97.5 kg, SpO2 99 %, not currently breastfeeding. Body mass index is 38.09 kg/m.  Treatment Plan Summary: Daily contact with patient to assess and evaluate symptoms and progress in treatment and Medication  management  Observation Level/Precautions:  15 minute checks  Laboratory:   As indicated.  Psychotherapy: Individual and group therapy.  Medications:  Continue gabapentin 300 mg 3 times a day for mood and anxiety Abilify was held because of prolonged QTc interval.  Current QTc is 486 which has gone down from 506. Continue Seroquel 50 mg at night for mood and sleep Titrate Cymbalta from 30 mg to 60 mg daily to address depression symptoms Continue trazodone 50 mg as needed for sleep  Consultations: As indicated  Discharge Concerns: Reestablish contact with the ACT team  Estimated LOS: 5 days  Other:     Physician Treatment Plan for Primary Diagnosis: MDD (major depressive disorder) Long Term Goal(s): Improvement in symptoms so as ready for discharge  Short Term Goals: Ability to verbalize feelings will improve, Ability to disclose and discuss suicidal ideas, and Ability to demonstrate self-control will improve  Physician Treatment Plan for Secondary Diagnosis: Principal Problem:   MDD (major depressive disorder)  Long Term Goal(s): Improvement in symptoms so as ready for discharge  Short Term Goals: Ability to verbalize feelings will improve, Ability to disclose and discuss suicidal ideas, and Compliance with prescribed medications will improve  I certify that inpatient services furnished can reasonably be expected to improve the patient's condition.    Sarita Bottom, MD 5/28/202412:11 PMPatient ID: [name], female   DOB:  Feb 25, 1996, 27 y.o.   MRN: [id number]

## 2023-01-04 NOTE — Group Note (Signed)
Recreation Therapy Group Note   Group Topic:Animal Assisted Therapy   Group Date: 01/04/2023 Start Time: 0945 End Time: 1033 Facilitators: Ralph Benavidez-McCall, LRT,CTRS Location: [address] Dayroom   Animal-Assisted Activity (AAA) Program Checklist/Progress Notes Patient Eligibility Criteria Checklist & Daily Group note for Rec Tx Intervention  AAA/T Program Assumption of Risk Form signed by Patient/ or Parent Legal Guardian Yes  Patient is free of allergies or severe asthma Yes  Patient reports no fear of animals Yes  Patient reports no history of cruelty to animals Yes  Patient understands his/her participation is voluntary Yes  Patient washes hands before animal contact Yes  Patient washes hands after animal contact Yes   Affect/Mood: Appropriate   Participation Level: Engaged   Participation Quality: Independent   Behavior: Appropriate   Speech/Thought Process: Focused   Insight: Good   Judgement: Good   Modes of Intervention: Teaching laboratory technician   Patient Response to Interventions:  Engaged   Education Outcome:  Acknowledges education   Clinical Observations/Individualized Feedback:  Patient attended session and interacted appropriately with therapy dog and peers. Patient asked appropriate questions about therapy dog and his training. Patient shared stories about their pets at home with group.    Plan: Continue to engage patient in RT group sessions 2-3x/week.   Benjie Ricketson-McCall, LRT,CTRS 01/04/2023 12:24 PM

## 2023-01-04 NOTE — Group Note (Signed)
Date:  01/04/2023 Time:  3:11 PM  Group Topic/Focus:  Coping With Mental Health Crisis:   The purpose of this group is to help patients identify strategies for coping with mental health crisis.  Group discusses possible causes of crisis and ways to manage them effectively. Emotional Education:   The focus of this group is to discuss what feelings/emotions are, and how they are experienced.    Participation Level:  Active  Participation Quality:  Appropriate  Affect:  Appropriate  Cognitive:  Appropriate  Insight: Appropriate  Engagement in Group:  Engaged  Modes of Intervention:  Discussion, Rapport Building, and Support  Additional Comments:    D'Arcy Abraha M Inocente Krach 01/04/2023, 3:11 PM  

## 2023-01-04 NOTE — Progress Notes (Signed)
   01/04/23 0900  Psych Admission Type (Psych Patients Only)  Admission Status Involuntary  Psychosocial Assessment  Patient Complaints None  Eye Contact Fair  Facial Expression Animated  Affect Appropriate to circumstance  Speech Logical/coherent  Interaction Assertive  Motor Activity Slow  Appearance/Hygiene Unremarkable  Behavior Characteristics Cooperative;Appropriate to situation  Mood Pleasant  Thought Process  Coherency WDL  Content WDL  Delusions None reported or observed  Perception WDL  Hallucination None reported or observed  Judgment Impaired  Confusion None  Danger to Self  Current suicidal ideation? Denies  Agreement Not to Harm Self Yes  Description of Agreement verbal  Danger to Others  Danger to Others None reported or observed

## 2023-01-04 NOTE — Group Note (Signed)
Date:  01/04/2023 Time:  5:15 PM  Group Topic/Focus:  MHA Group/Peer Support/WRAP    Participation Level:  Active  Participation Quality:  Appropriate  Affect:  Appropriate  Cognitive:  Appropriate  Insight: Appropriate  Engagement in Group:  Engaged  Modes of Intervention:  Education and Support  Additional Comments:   Pt attended the [facility] therapeutic group.  Sharon Ward 01/04/2023, 5:15 PM

## 2023-01-04 NOTE — Progress Notes (Signed)
   01/04/23 2030  Psych Admission Type (Psych Patients Only)  Admission Status Involuntary  Psychosocial Assessment  Patient Complaints None  Eye Contact Fair  Facial Expression Flat  Affect Appropriate to circumstance  Speech Logical/coherent  Interaction Assertive  Motor Activity Other (Comment) (wnl)  Appearance/Hygiene Unremarkable  Behavior Characteristics Cooperative;Appropriate to situation  Mood Pleasant  Thought Process  Coherency WDL  Content WDL  Delusions None reported or observed  Perception WDL  Hallucination None reported or observed  Judgment Limited  Confusion None  Danger to Self  Current suicidal ideation? Denies  Agreement Not to Harm Self Yes  Description of Agreement verbal  Danger to Others  Danger to Others None reported or observed   Progress note   D: Pt seen at nurse's station. Pt denies SI, HI, AVH. Contracts for safety. Pt rates pain  0/10. Pt rates anxiety  0/10 and depression  0/10. Pt states that it was a "chill day." Pt attended groups, went to the gym and has a good appetite. Reports good sleep as well. States that the provider is planning discharge for sometime this week. Pt told to ask provider directly is she would like to know a more definite date. No other concerns noted at this time.  A: Pt provided support and encouragement. Pt given scheduled medication as prescribed. PRNs as appropriate. Q15 min checks for safety.   R: Pt safe on the unit. Will continue to monitor.

## 2023-01-04 NOTE — Group Note (Signed)
Date:  01/04/2023 Time:  4:46 PM  Group Topic/Focus:  Goals Group:   The focus of this group is to help patients establish daily goals to achieve during treatment and discuss how the patient can incorporate goal setting into their daily lives to aide in recovery. Orientation:   The focus of this group is to educate the patient on the purpose and policies of crisis stabilization and provide a format to answer questions about their admission.  The group details unit policies and expectations of patients while admitted.    Participation Level:  Active  Participation Quality:  Appropriate  Affect:  Appropriate  Cognitive:  Appropriate  Insight: Appropriate  Engagement in Group:  Engaged  Modes of Intervention:  Discussion, Orientation, and Support  Additional Comments:   Pt attended and participated in the Orientation/Goals group.  Sharon Ward Gabriele Zwilling 01/04/2023, 4:46 PM

## 2023-01-04 NOTE — [facility] Group Notes (Signed)
Adult Psychoeducational Group Note  Date:  01/04/2023 Time:  10:38 PM  Group Topic/Focus:  Wrap-Up Group:   The focus of this group is to help patients review their daily goal of treatment and discuss progress on daily workbooks.  Participation Level:  Active  Participation Quality:  Appropriate  Affect:  Appropriate  Cognitive:  Appropriate  Insight: Appropriate  Engagement in Group:  Engaged  Modes of Intervention:  Discussion  Additional Comments:  Pt participated in wrap-up group.  Kloi Brodman S Haden Suder 01/04/2023, 10:38 PM 

## 2023-01-05 DIAGNOSIS — F332 Major depressive disorder, recurrent severe without psychotic features: Secondary | ICD-10-CM | POA: Diagnosis not present

## 2023-01-05 NOTE — Plan of Care (Signed)
  Problem: Safety: Goal: Periods of time without injury will increase Outcome: Progressing   

## 2023-01-05 NOTE — Progress Notes (Signed)
   01/05/23 0558  15 Minute Checks  Location Bedroom  Visual Appearance Calm  Behavior Sleeping  Sleep (Behavioral Health Patients Only)  Calculate sleep? (Click Yes once per 24 hr at 0600 safety check) Yes  Documented sleep last 24 hours 9

## 2023-01-05 NOTE — Progress Notes (Signed)
Psychiatric progress note  Patient Identification: Sharon Ward MRN:  [id number] Date of Evaluation:  01/05/2023 Chief Complaint:  MDD (major depressive disorder) [F32.9] Principal Diagnosis: MDD (major depressive disorder) Diagnosis:  Principal Problem:   MDD (major depressive disorder) Active Problems:   MDD (major depressive disorder), recurrent severe, without psychosis (HCC)  Reason for admission   The patient is a 27 year old Caucasian female with a long history of bipolar depression and cluster B personality disorder who was seen in the ED after she took an overdose in a suicide attempt.  She continued to have persistent suicidal thoughts and was unable to contract for safety.  She was sent to [facility] behavioral health on an IVC which was upheld.  Chart review from last 24 hours   Staff reports that the patient is compliant with treatment.  She slept 8 0.5 hours. She took the scheduled medications as prescribed She received as needed trazodone 50 mg at night  Information obtained during interview   Patient was evaluated in her room, she continues to present with pleasant mood and affect continuing to deny any passive or active SI intention or plan, continues to regret her suicide attempt, reporting planning to block her ex-boyfriend on the phone to avoid any contact with him given it was the main trigger of her suicide attempt, presents future oriented about visiting her children on Saturday and continue to follow-up with [facility] ACT team after discharge.  She denies side effect medications and agrees to comply after discharge.  She denies any feeling depressed or anxious at this time, reports fair sleep and appetite improved since admission. Attending groups and participating, able to discuss coping skills with stressors as well as crisis plan.  Identified family members in [place] area as good support and main support locally is through her ACT team.  Total Time spent with patient: 35  minutes  Past Psychiatric History: Patient has a long history of bipolar disorder and depressive symptoms and has had multiple suicidal attempts in the past.  History is well documented in the records.  She was discharged from home [facility] on 12/24/2022  Is the patient at risk to self? Yes.    Has the patient been a risk to self in the past 6 months? Yes.    Has the patient been a risk to self within the distant past? Yes.    Is the patient a risk to others? No.  Has the patient been a risk to others in the past 6 months? No.  Has the patient been a risk to others within the distant past? No.   [name] Scale:  Flowsheet Row Ad[facility] ([facility]) [facility] from 12/31/2022 in [facility] Admission (Discharged) from [facility]  C-SSRS RISK CATEGORY High Risk High Risk High Risk        Prior Inpatient Therapy: Yes.   If yes, describe multiple prior hospitalizations. Prior Outpatient Therapy: Yes.   If yes, describe [facility] ACT team.  Alcohol Screening: 1. How often do you have a drink containing alcohol?: 2 to 3 times a week 2. How many drinks containing alcohol do you have on a typical day when you are drinking?: 5 or 6 3. How often do you have six or more drinks on one occasion?: Daily or almost daily AUDIT-C Score: 9 4. How often during the last year have you found that you were not able to  stop drinking once you had started?: Never 5. How often during the last year have you failed to do what was normally expected from you because of drinking?: Never 6. How often during the last year have you needed a first drink in the morning to get yourself going after a heavy drinking session?: Never 7. How often during the last year have you had a feeling of guilt of remorse after drinking?: Never 8. How often during the last year have you been unable to  remember what happened the night before because you had been drinking?: Never 9. Have you or someone else been injured as a result of your drinking?: No 10. Has a relative or friend or a doctor or another health worker been concerned about your drinking or suggested you cut down?: No Alcohol Use Disorder Identification Test Final Score (AUDIT): 9 Alcohol Brief Interventions/Follow-up: Alcohol education/Brief advice Substance Abuse History in the last 12 months:  No.  But positive for marijuana. Consequences of Substance Abuse: Negative Previous Psychotropic Medications: Yes  Psychological Evaluations: No  Past Medical History:  Past Medical History:  Diagnosis Date   Asthma    Bipolar 1 disorder, mixed (HCC)    Depression    Generalized anxiety disorder    Intentional drug overdose (HCC) 11/03/2020   Low-lying placenta 01/07/2022   Resolved 03/04/22   Relationship dysfunction    Suicide attempt (HCC) 11/02/2020    Past Surgical History:  Procedure Laterality Date   PILONIDAL CYST / SINUS EXCISION  09/11/2013   PILONIDAL CYST EXCISION  05/17/2014   Pilonidal cystectomy with cleft lip   Family History:  Family History  Problem Relation Age of Onset   Asthma Mother    Diabetes Mother    Healthy Mother    Hypertension Father    Asthma Father    Diabetes Father    Healthy Father    Asthma Brother    Hypertension Paternal Uncle    Diabetes Paternal Grandmother    Stroke Paternal Grandfather    Heart disease Paternal Grandfather    Hypertension Paternal Grandfather    Diabetes Paternal Grandfather    Family Psychiatric  History: Unknown at this time. Tobacco Screening:  Social History   Tobacco Use  Smoking Status Former   Packs/day: 1.00   Years: 15.00   Additional pack years: 0.00   Total pack years: 15.00   Types: Cigarettes   Quit date: 07/14/2021   Years since quitting: 1.4  Smokeless Tobacco Never  Tobacco Comments   only smoke a "couple" of cigarettes when  stressed or anxious, socially with friends per [facility] chart    BH Tobacco Counseling     Are you interested in Tobacco Cessation Medications?  Yes, implement Nicotene Replacement Protocol Counseled patient on smoking cessation:  Yes Reason Tobacco Screening Not Completed: Patient Refused Screening       Social History:  Social History   Substance and Sexual Activity  Alcohol Use Not Currently   Comment: occasional prior to pregnancy     Social History   Substance and Sexual Activity  Drug Use Not Currently   Frequency: 4.0 times per week   Types: Marijuana   Comment: No Delta 9 since February 2023    Additional Social History: Marital status: Single Are you sexually active?: Yes What is your sexual orientation?: Bisexual Has your sexual activity been affected by drugs, alcohol, medication, or emotional stress?: No Does patient have children?: Yes How many children?: 2 How is patient's relationship with  their children?: "I have a 16yo and a 75 month old and they live with their foster parent but I see them every Thursday '                         Allergies:   Allergies  Allergen Reactions   Ascorbate Rash   Citrus Rash   Coconut Flavor Rash   Lamotrigine Rash   Orange (Diagnostic) Rash   Peach Flavor Rash   Pear Rash   Pineapple Rash   Lab Results:  No results found for this or any previous visit (from the past 48 hour(s)).   Blood Alcohol level:  Lab Results  Component Value Date   ETH <10 12/31/2022   ETH <10 12/19/2022    Metabolic Disorder Labs:  Lab Results  Component Value Date   HGBA1C 5.2 10/19/2022   MPG 103 10/19/2022   MPG 97 10/04/2022   Lab Results  Component Value Date   PROLACTIN 48.1 (H) 11/10/2021   Lab Results  Component Value Date   CHOL 190 12/19/2022   TRIG 92 12/19/2022   HDL 65 12/19/2022   CHOLHDL 2.9 12/19/2022   VLDL 18 12/19/2022   LDLCALC 107 (H) 12/19/2022   LDLCALC 101 (H) 10/19/2022    Current  Medications: Current Facility-Administered Medications  Medication Dose Route Frequency Provider Last Rate Last Admin   acetaminophen (TYLENOL) tablet 650 mg  650 mg Oral Q6H PRN Massengill, Harrold Donath, MD       acetaminophen (TYLENOL) tablet 650 mg  650 mg Oral Q6H PRN Massengill, Harrold Donath, MD       alum & mag hydroxide-simeth (MAALOX/MYLANTA) 200-200-20 MG/5ML suspension 30 mL  30 mL Oral Q4H PRN Massengill, Harrold Donath, MD       diphenhydrAMINE (BENADRYL) capsule 50 mg  50 mg Oral TID PRN Massengill, Harrold Donath, MD       Or   diphenhydrAMINE (BENADRYL) injection 50 mg  50 mg Intramuscular TID PRN Massengill, Harrold Donath, MD       ziprasidone (GEODON) injection 10 mg  10 mg Intramuscular TID PRN Massengill, Harrold Donath, MD       And   diphenhydrAMINE (BENADRYL) injection 50 mg  50 mg Intramuscular TID PRN Massengill, Harrold Donath, MD       DULoxetine (CYMBALTA) DR capsule 60 mg  60 mg Oral Daily Abbott Pao, Melanye Hiraldo, MD   60 mg at 01/05/23 0749   gabapentin (NEURONTIN) capsule 300 mg  300 mg Oral TID Phineas Inches, MD   300 mg at 01/05/23 0749   ibuprofen (ADVIL) tablet 600 mg  600 mg Oral Q8H PRN Massengill, Harrold Donath, MD       LORazepam (ATIVAN) tablet 2 mg  2 mg Oral TID PRN Phineas Inches, MD       Or   LORazepam (ATIVAN) injection 2 mg  2 mg Intramuscular TID PRN Massengill, Harrold Donath, MD       LORazepam (ATIVAN) tablet 1 mg  1 mg Oral TID PRN Massengill, Harrold Donath, MD       magnesium hydroxide (MILK OF MAGNESIA) suspension 30 mL  30 mL Oral Daily PRN Massengill, Harrold Donath, MD       nicotine polacrilex (NICORETTE) gum 2 mg  2 mg Oral PRN Massengill, Harrold Donath, MD   2 mg at 01/04/23 1348   QUEtiapine (SEROQUEL) tablet 50 mg  50 mg Oral QHS Rex Kras, MD   50 mg at 01/04/23 2100   traZODone (DESYREL) tablet 50 mg  50 mg Oral QHS PRN Massengill, Harrold Donath,  MD   50 mg at 01/04/23 2100   PTA Medications: Medications Prior to Admission  Medication Sig Dispense Refill Last Dose   ARIPiprazole (ABILIFY) 5 MG tablet Take 1 tablet  (5 mg total) by mouth at bedtime. 30 tablet 0    gabapentin (NEURONTIN) 100 MG capsule Take 300 mg by mouth 3 (three) times daily.      hydrOXYzine (ATARAX) 25 MG tablet Take 1 tablet (25 mg total) by mouth 3 (three) times daily as needed for anxiety. 30 tablet 0    nicotine polacrilex (NICORETTE) 2 MG gum Take 1 each (2 mg total) by mouth as needed for smoking cessation. (Patient not taking: Reported on 12/31/2022) 100 tablet 0    traZODone (DESYREL) 50 MG tablet Take 1 tablet (50 mg total) by mouth at bedtime as needed for sleep. 30 tablet 0     Musculoskeletal: Strength & Muscle Tone: within normal limits Gait & Station: normal Patient leans: N/A            Psychiatric Specialty Exam:  Presentation  General Appearance:  Casual  Eye Contact: Fair  Speech: Normal Speech Volume: Normal Handedness: Right   Mood and Affect  Mood: Euthymic, calm and pleasant Affect: Restricted   Thought Process  Thought Processes: Linear  Duration of Psychotic Symptoms:N/A Past Diagnosis of Schizophrenia or Psychoactive disorder: No  Descriptions of Associations:Intact  Orientation:Full (Time, Place and Person)  Thought Content:Logical  Hallucinations:No data recorded  Ideas of Reference:None  Suicidal Thoughts:No data recorded  Homicidal Thoughts:No data recorded Denies SI HI or AVH  Sensorium  Memory: Immediate Fair; Remote Fair; Recent Fair  Judgment: Fair  Insight: Fair   Art therapist  Concentration: Fair  Attention Span: Fair  Recall: [employer] of Knowledge: Fair  Language: Fair   Psychomotor Activity  Psychomotor Activity: No data recorded   Assets  Assets: Communication Skills; Desire for Improvement; Housing   Sleep  Sleep: No data recorded    Physical Exam: Physical Exam Vitals and nursing note reviewed.    Review of Systems  All other systems reviewed and are negative.  Blood pressure 110/72, pulse  77, temperature 98.2 F (36.8 C), temperature source Oral, resp. rate 16, height 5\' 3"  (1.6 m), weight 97.5 kg, SpO2 99 %, not currently breastfeeding. Body mass index is 38.09 kg/m.  Treatment Plan Summary: Daily contact with patient to assess and evaluate symptoms and progress in treatment and Medication management  Observation Level/Precautions:  15 minute checks  Laboratory:   As indicated.  Psychotherapy: Individual and group therapy.  Medications:  Continue gabapentin 300 mg 3 times a day for mood and anxiety Abilify was held because of prolonged QTc interval.  Current QTc is 486 which has gone down from 506. Continue Seroquel 50 mg at night for mood and sleep Continue Cymbalta 60 mg daily to address depression symptoms Continue trazodone 50 mg as needed for sleep  Consultations: As indicated  Discharge Concerns: Reestablish contact with the ACT team  Estimated LOS: 5 days  Other:     Physician Treatment Plan for Primary Diagnosis: MDD (major depressive disorder) Long Term Goal(s): Improvement in symptoms so as ready for discharge  Short Term Goals: Ability to verbalize feelings will improve, Ability to disclose and discuss suicidal ideas, and Ability to demonstrate self-control will improve  Physician Treatment Plan for Secondary Diagnosis: Principal Problem:   MDD (major depressive disorder) Active Problems:   MDD (major depressive disorder), recurrent severe, without psychosis (HCC)  Long Term Goal(s):  Improvement in symptoms so as ready for discharge  Short Term Goals: Ability to verbalize feelings will improve, Ability to disclose and discuss suicidal ideas, and Compliance with prescribed medications will improve  I certify that inpatient services furnished can reasonably be expected to improve the patient's condition.    Sarita Bottom, MD 5/29/202411:20 AMPatient ID: [name], female   DOB: Dec 14, 1995, 27 y.o.   MRN: [id number]

## 2023-01-05 NOTE — Group Note (Signed)
Recreation Therapy Group Note   Group Topic:Communication  Group Date: 01/05/2023 Start Time: 0930 End Time: 1000 Facilitators: Marka Treloar-McCall, LRT,CTRS Location: [address] Dayroom   Goal Area(s) Addresses:  Patient will effectively listen to complete activity.  Patient will identify communication skills used to make activity successful.  Patient will identify how skills used during activity can be used to reach post d/c goals.    Group Description:  Geometric Drawings.  Three volunteers from the peer group will be shown an abstract picture with a particular arrangement of geometrical shapes.  Each round, one 'speaker' will describe the pattern, as accurately as possible without revealing the image to the group.  The remaining group members will listen and draw the picture to reflect how it is described to them. Patients with the role of 'listener' cannot ask clarifying questions but, may request that the speaker repeat a direction. Once the drawings are complete, the presenter will show the rest of the group the picture and compare how close each person came to drawing the picture. LRT will facilitate a post-activity discussion regarding effective communication and the importance of planning, listening, and asking for clarification in daily interactions with others.   Affect/Mood: Appropriate   Participation Level: Engaged   Participation Quality: Independent   Behavior: Appropriate   Speech/Thought Process: Focused   Insight: Good   Judgement: Good   Modes of Intervention: Activity   Patient Response to Interventions:  Engaged   Education Outcome:  Acknowledges education   Clinical Observations/Individualized Feedback: Pt attended and participated in group session.    Plan: Continue to engage patient in RT group sessions 2-3x/week.   Sharon Ward, LRT,CTRS  01/05/2023 12:35 PM

## 2023-01-05 NOTE — Group Note (Signed)
Date:  01/05/2023 Time:  12:00 PM  Group Topic/Focus:  Goals Group:   The focus of this group is to help patients establish daily goals to achieve during treatment and discuss how the patient can incorporate goal setting into their daily lives to aide in recovery. Orientation:   The focus of this group is to educate the patient on the purpose and policies of crisis stabilization and provide a format to answer questions about their admission.  The group details unit policies and expectations of patients while admitted.    Participation Level:  Active  Participation Quality:  Attentive  Affect:  Appropriate  Cognitive:  Appropriate  Insight: Appropriate  Engagement in Group:  Engaged  Modes of Intervention:  Discussion  Additional Comments:  Patient attended group and was attentive the duration of it. Patient's goal was to come up with a discharge plan and read more.   Stephen Baruch T Yazmyn Valbuena 01/05/2023, 12:00 PM

## 2023-01-05 NOTE — [facility] Suicide Risk Assessment (Signed)
[facility] INPATIENT:  Family/Significant Other Suicide Prevention Education  Suicide Prevention Education:  Education Completed; [place] [phone] ),  (name of family member/significant other) has been identified by the patient as the family member/significant other with whom the patient will be residing, and identified as the person(s) who will aid the patient in the event of a mental health crisis (suicidal ideations/suicide attempt).  With written consent from the patient, the family member/significant other has been provided the following suicide prevention education, prior to the and/or following the discharge of the patient.  The suicide prevention education provided includes the following: Suicide risk factors Suicide prevention and interventions National Suicide Hotline telephone number [facility] assessment telephone number [facility] Emergency Assistance 911 [facility] and/or Residential Mobile Crisis Unit telephone number  Request made of family/significant other to: Remove weapons (e.g., guns, rifles, knives), all items previously/currently identified as safety concern.   Remove drugs/medications (over-the-counter, prescriptions, illicit drugs), all items previously/currently identified as a safety concern.  The family member/significant other verbalizes understanding of the suicide prevention education information provided.  The family member/significant other agrees to remove the items of safety concern listed above.  Isabella Bowens 01/05/2023, 11:20 AM

## 2023-01-05 NOTE — [facility] Group Notes (Signed)
Date:  01/05/2023 Time:  11:54 AM  Group Topic/Focus:  Wellness Toolbox:   The focus of this group is to discuss various aspects of wellness, balancing those aspects and exploring ways to increase the ability to experience wellness.  Patients will create a wellness toolbox for use upon discharge.  Participation Level:  Active  Participation Quality:  Attentive  Affect:  Appropriate  Cognitive:  Appropriate  Insight: Appropriate  Engagement in Group:  Engaged  Modes of Intervention:  Exploration  Additional Comments:  Pt attended group. Pt was able to reflect on activity and add new coping skill to welllness toolbox.   [name] [name] 01/05/2023, 11:54 AM 

## 2023-01-05 NOTE — Progress Notes (Signed)
   01/05/23 0900  Psychosocial Assessment  Patient Complaints None  Eye Contact Fair  Facial Expression Animated  Affect Appropriate to circumstance  Speech Logical/coherent  Interaction Assertive  Motor Activity Other (Comment) (wdl)  Appearance/Hygiene Unremarkable  Behavior Characteristics Cooperative;Appropriate to situation  Mood Pleasant;Euthymic  Thought Process  Coherency WDL  Content WDL  Delusions None reported or observed  Perception WDL  Hallucination None reported or observed  Judgment Limited  Confusion None  Danger to Self  Current suicidal ideation? Denies  Agreement Not to Harm Self Yes  Description of Agreement Verbal contract  Danger to Others  Danger to Others None reported or observed

## 2023-01-05 NOTE — Progress Notes (Signed)
   01/05/23 2100  Psych Admission Type (Psych Patients Only)  Admission Status Involuntary  Psychosocial Assessment  Patient Complaints None  Eye Contact Fair  Facial Expression Animated  Affect Appropriate to circumstance  Speech Logical/coherent  Interaction Assertive  Motor Activity Other (Comment) (WNL)  Appearance/Hygiene Unremarkable  Behavior Characteristics Cooperative;Appropriate to situation  Mood Pleasant  Thought Process  Coherency WDL  Content WDL  Delusions None reported or observed  Perception WDL  Hallucination None reported or observed  Judgment Limited  Confusion None  Danger to Self  Current suicidal ideation? Denies  Agreement Not to Harm Self Yes  Description of Agreement verbal  Danger to Others  Danger to Others None reported or observed   Alert/oriented. Makes needs/concerns known to staff. Pleasant cooperative with staff. Denies SI/HI/A/V hallucinations. Patient states went to group. Will encourage compliance and progression towards goals. Verbally contracted for safety. Will continue to monitor.

## 2023-01-05 NOTE — Progress Notes (Signed)
Psychoeducational Group Note  Date:  01/05/2023 Time:  2217  Group Topic/Focus:  Relapse Prevention Planning:   The focus of this group is to define relapse and discuss the need for planning to combat relapse.  Participation Level: Did Not Attend  Participation Quality:  Not Applicable  Affect:  Not Applicable  Cognitive:  Not Applicable  Insight:  Not Applicable  Engagement in Group: Not Applicable  Additional Comments:  The patient walked out of the dayroom before the group commenced.   Hazle Coca S 01/05/2023, 10:17 PM

## 2023-01-06 DIAGNOSIS — F332 Major depressive disorder, recurrent severe without psychotic features: Principal | ICD-10-CM

## 2023-01-06 MED ORDER — QUETIAPINE FUMARATE 50 MG PO TABS: 50.0000 mg | ORAL_TABLET | Freq: Every day | ORAL | 0 refills | Status: DC

## 2023-01-06 MED ORDER — NICOTINE POLACRILEX 2 MG MT GUM: 2.0000 mg | CHEWING_GUM | OROMUCOSAL | 0 refills | Status: DC | PRN

## 2023-01-06 MED ORDER — GABAPENTIN 100 MG PO CAPS: 300.0000 mg | ORAL_CAPSULE | Freq: Three times a day (TID) | ORAL | 0 refills | Status: DC

## 2023-01-06 MED ORDER — DULOXETINE HCL 60 MG PO CPEP: 60.0000 mg | ORAL_CAPSULE | Freq: Every day | ORAL | 0 refills | Status: DC

## 2023-01-06 MED ORDER — TRAZODONE HCL 50 MG PO TABS: 50.0000 mg | ORAL_TABLET | Freq: Every evening | ORAL | 0 refills | Status: DC | PRN

## 2023-01-06 NOTE — Progress Notes (Signed)
  [facility] Adult Case Management Discharge Plan :  Will you be returning to the same living situation after discharge:  Yes,  Patient will be returning back to her home At discharge, do you have transportation home?: No.CSW will arrange transportation for patient via Tax[employer]  Do you have the ability to pay for your medications: Yes,  LME Medicaid [employer]   Release of information consent forms completed and in the chart;  Patient's signature needed at discharge.  Patient to Follow up at:  Follow-up Information     [facility] Follow up on 01/06/2023.   Why: You have follow up appointment with this provider tomorrow after 2PM. If you have any concerns please reach out to [place] (admin assist) [phone] or [place] at [phone] Contact information: [address]  [address] [place] [address] [phone]                 Next level of care provider has access to [facility] Link:no  Safety Planning and Suicide Prevention discussed: Yes,  [facility]      Has patient been referred to the Quitline?: Yes, faxed/e-referral on 01/06/2023  Patient has been referred for addiction treatment: No known substance use disorder.  Isabella Bowens, LCSWA 01/06/2023, 9:49 AM

## 2023-01-06 NOTE — [facility] Suicide Risk Assessment (Signed)
[facility] Discharge Suicide Risk Assessment   Principal Problem: MDD (major depressive disorder) Discharge Diagnoses: Principal Problem:   MDD (major depressive disorder) Active Problems:   MDD (major depressive disorder), recurrent severe, without psychosis (HCC)   Total Time spent with patient: 45 minutes  Reason for admission: The patient is a 27 year old Caucasian female with a long history of bipolar depression and cluster B personality disorder who was seen in the ED after she took an overdose in a suicide attempt. She continued to have persistent suicidal thoughts and was unable to contract for safety. She was sent to [facility] behavioral health on an IVC which was upheld.   PTA Medications:  Medications Prior to Admission  Medication Sig Dispense Refill Last Dose   ARIPiprazole (ABILIFY) 5 MG tablet Take 1 tablet (5 mg total) by mouth at bedtime. 30 tablet 0     gabapentin (NEURONTIN) 100 MG capsule Take 300 mg by mouth 3 (three) times daily.         hydrOXYzine (ATARAX) 25 MG tablet Take 1 tablet (25 mg total) by mouth 3 (three) times daily as needed for anxiety. 30 tablet 0     nicotine polacrilex (NICORETTE) 2 MG gum Take 1 each (2 mg total) by mouth as needed for smoking cessation. (Patient not taking: Reported on 12/31/2022) 100 tablet 0     traZODone (DESYREL) 50 MG tablet Take 1 tablet (50 mg total) by mouth at bedtime as needed for sleep. 30 tablet 0      Hospital Course:   During the patient's hospitalization, patient had extensive initial psychiatric evaluation, and follow-up psychiatric evaluations every day.  Psychiatric diagnoses provided upon initial assessment:  MDD (major depressive disorder)   Patient's psychiatric medications were adjusted on admission: Gabapentin was continued from home medications 300 mg 3 times a day for anxiety and mood, Abilify was discontinued secondary to prolonged QTc, Seroquel was started at 50 mg at bedtime for mood and sleep, Cymbalta was  started 30 mg daily for depression, trazodone 50 mg at bedtime as needed for sleep was continued on home medication  During the hospitalization, other adjustments were made to the patient's psychiatric medication regimen: Medications were continued from admission except Cymbalta was titrated up from 30 to 60 mg daily to address depression symptoms  Patient's care was discussed during the interdisciplinary team meeting every day during the hospitalization.  The patient denies having side effects to prescribed psychiatric medication.  Gradually, patient started adjusting to milieu. The patient was evaluated each day by a clinical provider to ascertain response to treatment. Improvement was noted by the patient's report of decreasing symptoms, improved sleep and appetite, affect, medication tolerance, behavior, and participation in unit programming.  Patient was asked each day to complete a self inventory noting mood, mental status, pain, new symptoms, anxiety and concerns.    Symptoms were reported as significantly decreased or resolved completely by discharge.   On day of discharge, patient was evaluated on 01/06/2023 the patient reports that their mood is stable. The patient denied having suicidal thoughts for more than 48 hours prior to discharge.  Patient denies having homicidal thoughts.  Patient denies having auditory hallucinations.  Patient denies any visual hallucinations or other symptoms of psychosis. The patient was motivated to continue taking medication with a goal of continued improvement in mental health.   The patient reports their target psychiatric symptoms of increased depression and anxiety responded well to the psychiatric medications, and the patient reports overall benefit other psychiatric hospitalization.  Supportive psychotherapy was provided to the patient. The patient also participated in regular group therapy while hospitalized. Coping skills, problem solving as well as  relaxation therapies were also part of the unit programming. Patient was able to identify coping skills with stressors, was adamant and consistent regarding her main stress prior to this admission was related to communication with her ex-boyfriend whom she is planning to block any contact from, she reported good support from ACT team staff as well as her family who live in the [place] area.  She was able to identify coping skills with stressors as well as crisis plan if worsening depression or recurring SI after discharge.  Labs were reviewed with the patient, and abnormal results were discussed with the patient.  The patient is able to verbalize their individual safety plan to this provider.  Behavioral Events: None  Restraints: None  Groups: Attended and participated  Medications Changes: As above   Sleep  Sleep: Fair, improved during hospital stay  Musculoskeletal: Strength & Muscle Tone: within normal limits Gait & Station: normal Patient leans: N/A  Psychiatric Specialty Exam  General Appearance: appears at stated age, fairly dressed and groomed  Behavior: pleasant and cooperative  Psychomotor Activity:No psychomotor agitation or retardation noted   Eye Contact: good Speech: normal amount, tone, volume and latency   Mood: euthymic Affect: congruent, pleasant and interactive  Thought Process: linear, goal directed, no circumstantial or tangential thought process noted, no racing thoughts or flight of ideas Descriptions of Associations: intact Thought Content: Hallucinations: denies AH, VH , does not appear responding to stimuli Delusions: No paranoia or other delusions noted Suicidal Thoughts: denies SI, intention, plan  Homicidal Thoughts: denies HI, intention, plan   Alertness/Orientation: alert and fully oriented  Insight: fair, improved Judgment: fair, improved  Memory: intact  Executive Functions  Concentration: intact  Attention Span: Fair Recall:  intact Fund of Knowledge: fair   Art therapist  Concentration: intact Attention Span: Fair Recall: intact Fund of Knowledge: fair   Assets  Assets: Manufacturing systems engineer; Desire for Improvement; Housing   Physical Exam: Physical Exam ROS Blood pressure 109/65, pulse 83, temperature (!) 97.2 F (36.2 C), temperature source Oral, resp. rate 16, height 5\' 3"  (1.6 m), weight 97.5 kg, SpO2 100 %, not currently breastfeeding. Body mass index is 38.09 kg/m.  Mental Status Per Nursing Assessment::   On Admission:  Suicidal ideation indicated by patient  Demographic Factors:  Caucasian, Low socioeconomic status, Living alone, and Unemployed  Loss Factors: NA  Historical Factors: Prior suicide attempts and Impulsivity  Risk Reduction Factors:   Positive social support  Continued Clinical Symptoms: Symptoms improved significantly during hospital stay Depression:   Hopelessness Impulsivity  Cognitive Features That Contribute To Risk:  Loss of executive function    Suicide Risk:  Minimal: No identifiable suicidal ideation.  Patients presenting with no risk factors but with morbid ruminations; may be classified as minimal risk based on the severity of the depressive symptoms   Follow-up Information     [facility] Follow up on 01/06/2023.   Why: You have follow up appointment with this provider tomorrow after 2PM. If you have any concerns please reach out to [place] (admin assist) [phone] or [place] at [phone] Contact information: [address]  [address] [place] [address] [phone]                 Plan Of Care/Follow-up recommendations:    Discharge recommendations:     Activity: as tolerated  Diet: heart healthy  #  It is recommended to the patient to continue psychiatric medications as prescribed, after discharge from the hospital.     # It is recommended to the patient to follow up with your outpatient psychiatric provider and  PCP.   # It was discussed with the patient, the impact of alcohol, drugs, tobacco have been there overall psychiatric and medical wellbeing, and total abstinence from substance use was recommended the patient.ed.   # Prescriptions provided or sent directly to preferred pharmacy at discharge. Patient agreeable to plan. Given opportunity to ask questions. Appears to feel comfortable with discharge.    # In the event of worsening symptoms, the patient is instructed to call the crisis hotline, 911 and or go to the nearest ED for appropriate evaluation and treatment of symptoms. To follow-up with primary care provider for other medical issues, concerns and or health care needs   # Patient was discharged home with a plan to follow up as noted above.   Patient agrees with D/C instructions and plan.  The patient received suicide prevention pamphlet:  Yes Belongings returned:  Clothing and Valuables  Total Time Spent in Direct Patient Care:  I personally spent 45 minutes on the unit in direct patient care. The direct patient care time included face-to-face time with the patient, reviewing the patient's chart, communicating with other professionals, and coordinating care. Greater than 50% of this time was spent in counseling or coordinating care with the patient regarding goals of hospitalization, psycho-education, and discharge planning needs.   Mende Biswell Abbott Pao 01/06/2023, 10:35 AM   Ardene Remley Abbott Pao, MD 01/06/2023, 10:35 AM

## 2023-01-06 NOTE — Group Note (Signed)
Date:  01/06/2023 Time:  9:59 AM  Group Topic/Focus:  Goals Group:   The focus of this group is to help patients establish daily goals to achieve during treatment and discuss how the patient can incorporate goal setting into their daily lives to aide in recovery. Orientation:   The focus of this group is to educate the patient on the purpose and policies of crisis stabilization and provide a format to answer questions about their admission.  The group details unit policies and expectations of patients while admitted.    Participation Level:  Active  Participation Quality:  Attentive  Affect:  Appropriate  Cognitive:  Appropriate  Insight: Appropriate  Engagement in Group:  Engaged  Modes of Intervention:  Discussion  Additional Comments:   Patient attended group and was attentive the duration of it. Patient's goal was to use new coping skills for her depression.   Sharon Ward T Lorraine Lax 01/06/2023, 9:59 AM

## 2023-01-06 NOTE — Progress Notes (Signed)
Patient verbalizes readiness for discharge. All patient belongings returned to patient. Discharge instructions read and discussed with patient (appointments, medications, resources). Patient expressed gratitude for care provided. Patient discharged to lobby at 1133 where taxi was waiting.
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
## 2023-01-07 NOTE — BHH Group Notes (Signed)
Spiritual care group on grief and loss facilitated by chaplain Dyanne Carrel, [facility]  Group Goal:  Support / Education around grief and loss  Members engage in facilitated group support and psycho-social education.  Group Description:  Following introductions and group rules, group members engaged in facilitated group dialog and support around topic of loss, with particular support around experiences of loss in their lives. Group Identified types of loss (relationships / self / things) and identified patterns, circumstances, and changes that precipitate losses. Reflected on thoughts / feelings around loss, normalized grief responses, and recognized variety in grief experience. Group noted Worden's four tasks of grief in discussion.  Group drew on Adlerian / Rogerian, narrative, MI,  Patient Progress: Sharon Ward attended group and actively engaged and participated in group conversation and activities.  She shared about the loss of custody of her children as well as the loss of a baby due to miscarriage.  Her comments demonstrated good insight and she was supportive of peers.

## 2023-03-16 DIAGNOSIS — T40422A Poisoning by tramadol, intentional self-harm, initial encounter: Secondary | ICD-10-CM | POA: Diagnosis not present

## 2023-03-16 DIAGNOSIS — T1491XA Suicide attempt, initial encounter: Secondary | ICD-10-CM | POA: Diagnosis not present

## 2023-03-16 DIAGNOSIS — J45909 Unspecified asthma, uncomplicated: Secondary | ICD-10-CM | POA: Diagnosis not present

## 2023-03-16 DIAGNOSIS — T426X2A Poisoning by other antiepileptic and sedative-hypnotic drugs, intentional self-harm, initial encounter: Secondary | ICD-10-CM | POA: Insufficient documentation

## 2023-03-16 DIAGNOSIS — F332 Major depressive disorder, recurrent severe without psychotic features: Secondary | ICD-10-CM | POA: Diagnosis not present

## 2023-03-16 DIAGNOSIS — F4310 Post-traumatic stress disorder, unspecified: Secondary | ICD-10-CM | POA: Insufficient documentation

## 2023-03-16 DIAGNOSIS — Z87891 Personal history of nicotine dependence: Secondary | ICD-10-CM | POA: Diagnosis not present

## 2023-03-16 DIAGNOSIS — X58XXXA Exposure to other specified factors, initial encounter: Secondary | ICD-10-CM | POA: Insufficient documentation

## 2023-03-17 ENCOUNTER — Other Ambulatory Visit: Payer: Self-pay

## 2023-03-17 ENCOUNTER — Emergency Department (HOSPITAL_COMMUNITY)
Admission: EM | Admit: 2023-03-17 | Discharge: 2023-03-18 | Disposition: A | Discharge status: Home / Self Care | Payer: MEDICAID | Attending: Emergency Medicine | Admitting: Emergency Medicine

## 2023-03-17 ENCOUNTER — Encounter (HOSPITAL_COMMUNITY): Payer: Self-pay

## 2023-03-17 DIAGNOSIS — F332 Major depressive disorder, recurrent severe without psychotic features: Secondary | ICD-10-CM | POA: Diagnosis present

## 2023-03-17 DIAGNOSIS — T71162A Asphyxiation due to hanging, intentional self-harm, initial encounter: Secondary | ICD-10-CM | POA: Diagnosis present

## 2023-03-17 DIAGNOSIS — T426X2A Poisoning by other antiepileptic and sedative-hypnotic drugs, intentional self-harm, initial encounter: Secondary | ICD-10-CM

## 2023-03-17 DIAGNOSIS — F4310 Post-traumatic stress disorder, unspecified: Secondary | ICD-10-CM

## 2023-03-17 DIAGNOSIS — F4312 Post-traumatic stress disorder, chronic: Secondary | ICD-10-CM | POA: Diagnosis present

## 2023-03-17 DIAGNOSIS — Z9151 Personal history of suicidal behavior: Secondary | ICD-10-CM | POA: Diagnosis present

## 2023-03-17 DIAGNOSIS — T40422A Poisoning by tramadol, intentional self-harm, initial encounter: Secondary | ICD-10-CM

## 2023-03-17 DIAGNOSIS — T50902A Poisoning by unspecified drugs, medicaments and biological substances, intentional self-harm, initial encounter: Secondary | ICD-10-CM

## 2023-03-17 LAB — COMPREHENSIVE METABOLIC PANEL
ALT: 16 U/L (ref 0–44)
AST: 16 U/L (ref 15–41)
Albumin: 4.3 g/dL (ref 3.5–5.0)
Alkaline Phosphatase: 51 U/L (ref 38–126)
Anion gap: 12 (ref 5–15)
BUN: 14 mg/dL (ref 6–20)
CO2: 21 mmol/L — ABNORMAL LOW (ref 22–32)
Calcium: 9.6 mg/dL (ref 8.9–10.3)
Chloride: 106 mmol/L (ref 98–111)
Creatinine, Ser: 0.67 mg/dL (ref 0.44–1.00)
GFR, Estimated: >60 mL/min (ref >60)
Glucose, Bld: 97 mg/dL (ref 70–99)
Potassium: 3.6 mmol/L (ref 3.5–5.1)
Sodium: 139 mmol/L (ref 135–145)
Total Bilirubin: 1 mg/dL (ref 0.3–1.2)
Total Protein: 7.2 g/dL (ref 6.5–8.1)

## 2023-03-17 LAB — URINALYSIS, ROUTINE W REFLEX MICROSCOPIC
Bilirubin Urine: NEGATIVE
Glucose, UA: NEGATIVE mg/dL
Ketones, ur: NEGATIVE mg/dL
Leukocytes,Ua: NEGATIVE
Nitrite: NEGATIVE
Protein, ur: 30 mg/dL — AB
RBC / HPF: >50 RBC/hpf (ref 0–5)
Specific Gravity, Urine: 1.009 (ref 1.005–1.030)
pH: 5 (ref 5.0–8.0)

## 2023-03-17 LAB — CBC WITH DIFFERENTIAL/PLATELET
Abs Immature Granulocytes: 0.01 10*3/uL (ref 0.00–0.07)
Basophils Absolute: 0.1 10*3/uL (ref 0.0–0.1)
Basophils Relative: 1 %
Eosinophils Absolute: 0.1 10*3/uL (ref 0.0–0.5)
Eosinophils Relative: 2 %
HCT: 36.5 % (ref 36.0–46.0)
Hemoglobin: 12 g/dL (ref 12.0–15.0)
Immature Granulocytes: 0 %
Lymphocytes Relative: 36 %
Lymphs Abs: 2.2 10*3/uL (ref 0.7–4.0)
MCH: 28.8 pg (ref 26.0–34.0)
MCHC: 32.9 g/dL (ref 30.0–36.0)
MCV: 87.5 fL (ref 80.0–100.0)
Monocytes Absolute: 0.6 10*3/uL (ref 0.1–1.0)
Monocytes Relative: 10 %
Neutro Abs: 3.3 10*3/uL (ref 1.7–7.7)
Neutrophils Relative %: 51 %
Platelets: 254 10*3/uL (ref 150–400)
RBC: 4.17 MIL/uL (ref 3.87–5.11)
RDW: 13.2 % (ref 11.5–15.5)
WBC: 6.3 10*3/uL (ref 4.0–10.5)
nRBC: 0 % (ref 0.0–0.2)

## 2023-03-17 LAB — MAGNESIUM: Magnesium: 2 mg/dL (ref 1.7–2.4)

## 2023-03-17 LAB — ACETAMINOPHEN LEVEL: Acetaminophen (Tylenol), Serum: <10 ug/mL — ABNORMAL LOW (ref 10–30)

## 2023-03-17 LAB — RAPID URINE DRUG SCREEN, HOSP PERFORMED
Amphetamines: NOT DETECTED
Barbiturates: NOT DETECTED
Benzodiazepines: NOT DETECTED
Cocaine: NOT DETECTED
Opiates: NOT DETECTED
Tetrahydrocannabinol: POSITIVE — AB

## 2023-03-17 LAB — HCG, SERUM, QUALITATIVE: Preg, Serum: NEGATIVE

## 2023-03-17 LAB — SALICYLATE LEVEL: Salicylate Lvl: <7 mg/dL — ABNORMAL LOW (ref 7.0–30.0)

## 2023-03-17 LAB — ETHANOL: Alcohol, Ethyl (B): <10 mg/dL (ref <10)

## 2023-03-17 MED ORDER — DULOXETINE HCL 30 MG PO CPEP
60.0000 mg | ORAL_CAPSULE | Freq: Every day | ORAL | Status: DC
  Administered 2023-03-18: 60 mg via ORAL
  Filled 2023-03-17: qty 2

## 2023-03-17 MED ORDER — ZIPRASIDONE MESYLATE 20 MG IM SOLR
20.0000 mg | Freq: Once | INTRAMUSCULAR | Status: AC
  Administered 2023-03-17: 20 mg via INTRAMUSCULAR
  Filled 2023-03-17: qty 20

## 2023-03-17 MED ORDER — QUETIAPINE FUMARATE 50 MG PO TABS: 50.0000 mg | ORAL_TABLET | Freq: Every day | ORAL | Status: DC

## 2023-03-17 MED ORDER — LACTATED RINGERS IV BOLUS
1000.0000 mL | Freq: Once | INTRAVENOUS | Status: AC
  Administered 2023-03-17: 1000 mL via INTRAVENOUS

## 2023-03-17 MED ORDER — DIPHENHYDRAMINE HCL 50 MG/ML IJ SOLN: 50.0000 mg | Freq: Once | INTRAMUSCULAR | Status: DC

## 2023-03-17 MED ORDER — LORAZEPAM 2 MG/ML IJ SOLN
2.0000 mg | Freq: Once | INTRAMUSCULAR | Status: DC
  Filled 2023-03-17: qty 1

## 2023-03-17 NOTE — BH Assessment (Addendum)
Pt was unable to be assessed by TTS due to awaiting medically clearance.
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
## 2023-03-17 NOTE — Consult Note (Signed)
Reached out to nursing, requesting to see patient for psych assessment via tts cart. Currently awaiting response.

## 2023-03-17 NOTE — ED Notes (Signed)
Pt was dressed out. Security called to wand pt down.
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
## 2023-03-17 NOTE — Consult Note (Signed)
Telepsych Consultation   Reason for Consult:   Overdose Referring Physician:  Dr. Glynn Octave Location of Patient:   [facility] Location of Provider: Other: virtual home office  Patient Identification: Sharon Ward MRN:  [id number] Principal Diagnosis: MDD (major depressive disorder), recurrent severe, without psychosis (HCC) Diagnosis:  Principal Problem:   MDD (major depressive disorder), recurrent severe, without psychosis (HCC) Active Problems:   PTSD (post-traumatic stress disorder)   Suicide attempt by drug ingestion (HCC)   Total Time spent with patient: 30 minutes  Subjective:   Sharon Ward is a 27 y.o. female patient admitted with Per RN Triage note dated 03/17/2023@1202am : "Pt BIB EMS from home for SI by overdose. Boyfriend called for help after the pt started sending messages stating she wanted to end her life. EMS was called to go look for her. Per EMS pt took an unknown amount of gabapentin and tramadol around 2230.   114/70 95% RA 117 cbg  HPI:  Patient seen via telepsych by this provider; chart reviewed and consulted with Dr. Daleen Bo on 03/17/23.  On evaluation Sharon Ward is seen laying in bed, fairly groomed, wearing a hospital gown with the covers pulled to her chest area. Pt greeted and anticipatory guidance provided.  She agrees to assessment.  She is alert and oriented x4 but is slowed and appears tired.  She states she has a sore throat, her voice is raspy.   When asked about events that led to her current admission, pt reports she was trying to kill herself, triggered by ex who is also the father of her children. She reports her children are 9mos and 2 years old.  She reports her children are currently in DSS custody for reports of abandonment that she denies.   She reports prior to admission she got into a argument with her ex boyfriend regarding custody of their children.  Pt reports her boyfriend said things that were upsetting to her, "he told me I  should kill herself because no one cares about me and the kids would be better off without me." Pt states after hearing this, she decided to overdose on pills.    She currently endorses suicidal thoughts, feelings or worthlessness, hopelessness and despair.  She has a hx for depression, ptsd, and prior suicide attempts.  She reports being following by [facility] for outpatient mental health.  She is unsure of med doses but states she takes Gabapentin and prazosin.    Labs: CMP and CBC are within normal limits UDS is + for [facility] EKG:427/481 borderline prolonged QT intervals Per poison control patient remained in the emergency department for 6 hours post ingestion for observation.  Per notes she has remained stable and was medically cleared prior to psychiatry assessment.   During evaluation Sharon Ward is sitting upright on the hospital gurney, sitter is at her side; She is alert/oriented x 4; anxious but cooperative; and mood congruent with affect.  Patient is speaking in a clear tone at moderate volume, and normal pace; with good eye contact.  Her thought process is goal oriented; She does not appear to be responding to internal/external stimuli or experiencing delusional thought content.  Patient states she is suicidal admitted after an overdose attempt. She denies homicidal ideation, psychosis, and paranoia.  Patient has remained cooperative throughout assessment and has answered questions appropriately.    Per ED Provider Admission Assessment 03/16/2023: Chief Complaint  Patient presents with   Drug Overdose   Suicide Attempt  Sharon Ward is a 27 y.o. female.   Level 5 caveat for psychiatric illness.  Patient brought in by EMS after apparent suicide attempt.  She was texting her boyfriend stating that she wanted to end her life.  She took an unknown amount of gabapentin and trazodone (correction from triage note, not tramadol).  Around 10:30 PM.  She is screaming and combative on arrival.   She is not cooperative.  Unable to give any history.  Denies any pain.  Stable vital signs for EMS.   The history is provided by the EMS personnel and the patient. The history is limited by the condition of the patient.  Drug Overdose  Past Psychiatric History: as outlined below  Risk to Self:  yes Risk to Others:  no Prior Inpatient Therapy: yes  Prior Outpatient Therapy:  yes  Past Medical History:  Past Medical History:  Diagnosis Date   Asthma    Bipolar 1 disorder, mixed (HCC)    Depression    Generalized anxiety disorder    Intentional drug overdose (HCC) 11/03/2020   Low-lying placenta 01/07/2022   Resolved 03/04/22   Relationship dysfunction    Suicide attempt (HCC) 11/02/2020    Past Surgical History:  Procedure Laterality Date   PILONIDAL CYST / SINUS EXCISION  09/11/2013   PILONIDAL CYST EXCISION  05/17/2014   Pilonidal cystectomy with cleft lip   Family History:  Family History  Problem Relation Age of Onset   Asthma Mother    Diabetes Mother    Healthy Mother    Hypertension Father    Asthma Father    Diabetes Father    Healthy Father    Asthma Brother    Hypertension Paternal Uncle    Diabetes Paternal Grandmother    Stroke Paternal Grandfather    Heart disease Paternal Grandfather    Hypertension Paternal Grandfather    Diabetes Paternal Grandfather    Family Psychiatric  History: deferred Social History:  Social History   Substance and Sexual Activity  Alcohol Use Not Currently   Comment: occasional prior to pregnancy     Social History   Substance and Sexual Activity  Drug Use Not Currently   Frequency: 4.0 times per week   Types: Marijuana   Comment: No Delta 9 since February 2023    Social History   Socioeconomic History   Marital status: Single    Spouse name: Not on file   Number of children: 1   Years of education: 10   Highest education level: 10th grade  Occupational History   Not on file  Tobacco Use   Smoking  status: Former    Current packs/day: 0.00    Average packs/day: 1 pack/day for 15.0 years (15.0 ttl pk-yrs)    Types: Cigarettes    Start date: 07/14/2006    Quit date: 07/14/2021    Years since quitting: 1.6   Smokeless tobacco: Never   Tobacco comments:    only smoke a "couple" of cigarettes when stressed or anxious, socially with friends per [facility] chart  Vaping Use   Vaping status: Former   Start date: 08/10/2003   Quit date: 05/04/2021  Substance and Sexual Activity   Alcohol use: Not Currently    Comment: occasional prior to pregnancy   Drug use: Not Currently    Frequency: 4.0 times per week    Types: Marijuana    Comment: No Delta 9 since February 2023   Sexual activity: Not Currently    Partners: Male  Birth control/protection: None  Other Topics Concern   Not on file  Social History Narrative   Not on file   Social Determinants of Health   Financial Resource Strain: Not on file  Food Insecurity: No Food Insecurity (01/01/2023)   Hunger Vital Sign    Worried About Running Out of Food in the Last Year: Never true    Ran Out of Food in the Last Year: Never true  Transportation Needs: No Transportation Needs (01/01/2023)   PRAPARE - Administrator, Civil Service (Medical): No    Lack of Transportation (Non-Medical): No  Physical Activity: Not on file  Stress: Not on file  Social Connections: Not on file   Additional Social History:    Allergies:   Allergies  Allergen Reactions   Ascorbate Rash   Citrus Rash   Coconut Flavor Rash   Lamotrigine Rash   Orange (Diagnostic) Rash   Peach Flavor Rash   Pear Rash   Pineapple Rash    Labs:  Results for orders placed or performed during the hospital encounter of 03/17/23 (from the past 48 hour(s))  CBC with Differential     Status: None   Collection Time: 03/17/23  1:16 AM  Result Value Ref Range   WBC 6.3 4.0 - 10.5 K/uL   RBC 4.17 3.87 - 5.11 MIL/uL   Hemoglobin 12.0 12.0 - 15.0 g/dL   HCT 10.2  72.5 - 36.6 %   MCV 87.5 80.0 - 100.0 fL   MCH 28.8 26.0 - 34.0 pg   MCHC 32.9 30.0 - 36.0 g/dL   RDW 44.0 34.7 - 42.5 %   Platelets 254 150 - 400 K/uL   nRBC 0.0 0.0 - 0.2 %   Neutrophils Relative % 51 %   Neutro Abs 3.3 1.7 - 7.7 K/uL   Lymphocytes Relative 36 %   Lymphs Abs 2.2 0.7 - 4.0 K/uL   Monocytes Relative 10 %   Monocytes Absolute 0.6 0.1 - 1.0 K/uL   Eosinophils Relative 2 %   Eosinophils Absolute 0.1 0.0 - 0.5 K/uL   Basophils Relative 1 %   Basophils Absolute 0.1 0.0 - 0.1 K/uL   Immature Granulocytes 0 %   Abs Immature Granulocytes 0.01 0.00 - 0.07 K/uL    Comment: Performed at [facility], [address] [place], [place] [address]  Comprehensive metabolic panel     Status: Abnormal   Collection Time: 03/17/23  1:16 AM  Result Value Ref Range   Sodium 139 135 - 145 mmol/L   Potassium 3.6 3.5 - 5.1 mmol/L   Chloride 106 98 - 111 mmol/L   CO2 21 (L) 22 - 32 mmol/L   Glucose, Bld 97 70 - 99 mg/dL    Comment: Glucose reference range applies only to samples taken after fasting for at least 8 hours.   BUN 14 6 - 20 mg/dL   Creatinine, Ser 7.56 0.44 - 1.00 mg/dL   Calcium 9.6 8.9 - 43.3 mg/dL   Total Protein 7.2 6.5 - 8.1 g/dL   Albumin 4.3 3.5 - 5.0 g/dL   AST 16 15 - 41 U/L   ALT 16 0 - 44 U/L   Alkaline Phosphatase 51 38 - 126 U/L   Total Bilirubin 1.0 0.3 - 1.2 mg/dL   GFR, Estimated >29 >51 mL/min    Comment: (NOTE) Calculated using the CKD-EPI Creatinine Equation (2021)    Anion gap 12 5 - 15    Comment: Performed at [facility], [address] [place], [place] [address]  Ethanol     Status: None   Collection Time: 03/17/23  1:16 AM  Result Value Ref Range   Alcohol, Ethyl (B) <10 <10 mg/dL    Comment: (NOTE) Lowest detectable limit for serum alcohol is 10 mg/dL.  For medical purposes only. Performed at [facility], [address] [place], [place] [address]   Acetaminophen level      Status: Abnormal   Collection Time: 03/17/23  1:16 AM  Result Value Ref Range   Acetaminophen (Tylenol), Serum <10 (L) 10 - 30 ug/mL    Comment: (NOTE) Therapeutic concentrations vary significantly. A range of 10-30 ug/mL  may be an effective concentration for many patients. However, some  are best treated at concentrations outside of this range. Acetaminophen concentrations >150 ug/mL at 4 hours after ingestion  and >50 ug/mL at 12 hours after ingestion are often associated with  toxic reactions.  Performed at [facility], [address] [place], [place] [address]   Salicylate level     Status: Abnormal   Collection Time: 03/17/23  1:16 AM  Result Value Ref Range   Salicylate Lvl <7.0 (L) 7.0 - 30.0 mg/dL    Comment: Performed at [facility], [address], [place], [place] [address]  hCG, serum, qualitative     Status: None   Collection Time: 03/17/23  1:16 AM  Result Value Ref Range   Preg, Serum NEGATIVE NEGATIVE    Comment:        THE SENSITIVITY OF THIS METHODOLOGY IS >10 mIU/mL. Performed at [facility], [address], [place], [place] [address]   Magnesium     Status: None   Collection Time: 03/17/23  1:16 AM  Result Value Ref Range   Magnesium 2.0 1.7 - 2.4 mg/dL    Comment: Performed at [facility], [address] [place], [place] [address]  Rapid urine drug screen (hospital performed)     Status: Abnormal   Collection Time: 03/17/23  3:10 PM  Result Value Ref Range   Opiates NONE DETECTED NONE DETECTED   Cocaine NONE DETECTED NONE DETECTED   Benzodiazepines NONE DETECTED NONE DETECTED   Amphetamines NONE DETECTED NONE DETECTED   Tetrahydrocannabinol POSITIVE (A) NONE DETECTED   Barbiturates NONE DETECTED NONE DETECTED    Comment: (NOTE) DRUG SCREEN FOR MEDICAL PURPOSES ONLY.  IF CONFIRMATION IS NEEDED FOR ANY PURPOSE, NOTIFY LAB WITHIN 5 DAYS.  LOWEST DETECTABLE LIMITS FOR  URINE DRUG SCREEN Drug Class                     Cutoff (ng/mL) Amphetamine and metabolites    1000 Barbiturate and metabolites    200 Benzodiazepine                 200 Opiates and metabolites        300 Cocaine and metabolites        300 THC                            50 Performed at [facility], [address] [place], [place] [address]   Urinalysis, Routine w reflex microscopic -Urine, Clean Catch     Status: Abnormal   Collection Time: 03/17/23  3:11 PM  Result Value Ref Range   Color, Urine YELLOW YELLOW   APPearance HAZY (A) CLEAR  Specific Gravity, Urine 1.009 1.005 - 1.030   pH 5.0 5.0 - 8.0   Glucose, UA NEGATIVE NEGATIVE mg/dL   Hgb urine dipstick LARGE (A) NEGATIVE   Bilirubin Urine NEGATIVE NEGATIVE   Ketones, ur NEGATIVE NEGATIVE mg/dL   Protein, ur 30 (A) NEGATIVE mg/dL   Nitrite NEGATIVE NEGATIVE   Leukocytes,Ua NEGATIVE NEGATIVE   RBC / HPF >50 0 - 5 RBC/hpf   WBC, UA 11-20 0 - 5 WBC/hpf   Bacteria, UA RARE (A) NONE SEEN   Squamous Epithelial / HPF 0-5 0 - 5 /HPF   Mucus PRESENT     Comment: Performed at [facility], [address] [place], [place] [address]    Medications:  Current Facility-Administered Medications  Medication Dose Route Frequency Provider Last Rate Last Admin   diphenhydrAMINE (BENADRYL) injection 50 mg  50 mg Intramuscular Once Rancour, Stephen, MD       DULoxetine (CYMBALTA) DR capsule 60 mg  60 mg Oral Daily Rancour, Stephen, MD       LORazepam (ATIVAN) injection 2 mg  2 mg Intramuscular Once Rancour, Stephen, MD       QUEtiapine (SEROQUEL) tablet 50 mg  50 mg Oral QHS Rancour, Stephen, MD       Current Outpatient Medications  Medication Sig Dispense Refill   amoxicillin (AMOXIL) 500 MG tablet Take 500 mg by mouth every 6 (six) hours.     ibuprofen (ADVIL) 600 MG tablet Take 600 mg by mouth every 6 (six) hours as needed.     DULoxetine (CYMBALTA) 60 MG capsule Take 1 capsule (60 mg total) by  mouth daily. 30 capsule 0   gabapentin (NEURONTIN) 100 MG capsule Take 3 capsules (300 mg total) by mouth 3 (three) times daily. 90 capsule 0   nicotine polacrilex (NICORETTE) 2 MG gum Take 1 each (2 mg total) by mouth as needed for smoking cessation. 100 tablet 0   QUEtiapine (SEROQUEL) 50 MG tablet Take 1 tablet (50 mg total) by mouth at bedtime. 30 tablet 0   traZODone (DESYREL) 50 MG tablet Take 1 tablet (50 mg total) by mouth at bedtime as needed for sleep. 30 tablet 0    Musculoskeletal:pt  moves all extremities and ambulates independently. Strength & Muscle Tone: within normal limits Gait & Station: normal Patient leans: N/A          Psychiatric Specialty Exam:  Presentation  General Appearance:  Fairly Groomed  Eye Contact: Fair  Speech: Slow; Clear and Coherent  Speech Volume: Decreased  Handedness: Right   Mood and Affect  Mood: Anxious; Depressed; Hopeless; Worthless  Affect: Congruent; Tearful; Depressed   Thought Process  Thought Processes: Goal Directed  Descriptions of Associations:Intact  Orientation:Full (Time, Place and Person)  Thought Content:Illogical (because she wants to end her life)  History of Schizophrenia/Schizoaffective disorder:No  Duration of Psychotic Symptoms:N/A  Hallucinations:Hallucinations: None  Ideas of Reference:None  Suicidal Thoughts:Suicidal Thoughts: Yes, Active SI Active Intent and/or Plan: With Intent; With Plan; With Access to Means; With Means to Carry Out  Homicidal Thoughts:Homicidal Thoughts: No   Sensorium  Memory: Immediate Good; Recent Good; Remote Good  Judgment: Poor  Insight: Poor   Executive Functions  Concentration: Fair  Attention Span: Fair  Recall: Good  Fund of Knowledge: Fair  Language: Good   Psychomotor Activity  Psychomotor Activity:Psychomotor Activity: Decreased   Assets  Assets: Communication Skills; Housing; Financial  Resources/Insurance   Sleep  Sleep:Sleep: Fair Number of Hours of Sleep: 5  Physical Exam: Physical Exam Constitutional:      Appearance: Normal appearance.  Cardiovascular:     Rate and Rhythm: Normal rate.     Pulses: Normal pulses.  Pulmonary:     Effort: Pulmonary effort is normal.  Musculoskeletal:        General: Normal range of motion.     Cervical back: Normal range of motion.  Neurological:     Mental Status: She is alert and oriented to person, place, and time. Mental status is at baseline.  Psychiatric:        Attention and Perception: Attention and perception normal.        Mood and Affect: Mood is depressed. Affect is blunt.        Speech: Speech is delayed.        Behavior: Behavior is slowed.        Thought Content: Thought content is not paranoid or delusional. Thought content includes suicidal ideation. Thought content does not include homicidal ideation. Thought content includes suicidal plan. Thought content does not include homicidal plan.        Cognition and Memory: Cognition and memory normal.        Judgment: Judgment is impulsive.    Review of Systems  Constitutional: Negative.   HENT:  Positive for sore throat.   Eyes: Negative.   Respiratory: Negative.    Cardiovascular: Negative.   Gastrointestinal: Negative.   Genitourinary: Negative.   Musculoskeletal: Negative.   Skin: Negative.   Neurological: Negative.   Endo/Heme/Allergies: Negative.   Psychiatric/Behavioral:  Positive for depression and suicidal ideas. Negative for hallucinations.    Blood pressure 117/61, pulse 83, temperature 98.5 F (36.9 C), temperature source Oral, resp. rate 16, SpO2 96%, not currently breastfeeding. There is no height or weight on file to calculate BMI.  Treatment Plan Summary: Pt with hx of bipolar disorder and multiple suicide attempts, presents to the emergency department for evaluation of intentional overdose on pills following an argument with her ex  boyfriend.  On assessment today, pt continues to endorse suicidal ideation and does not contract for safety.  She is impulsive, appears sad, endorses feelings or worthlessness and hopelessness.  She is a high safety risk for which I recommend psychiatric admission where she can be restarted on her medications and monitored for safety and mood stability. Pt agrees with recommendations.  However, she she decide to leave, she meets criteria for IVC.   Daily contact with patient to assess and evaluate symptoms and progress in treatment and Medication management Her prolonged intervals have improved to borderline prolongation.  Considering this, recommend restarting her home medications;   Disposition: Pt is referred for psych admission.  Per Mardella Layman, [facility], there are no appropriate beds today d/t staffing shortages. Cathie Beams, LCSW to fax pt out for possible bed at other facilities.    This service was provided via telemedicine using a 2-way, interactive audio and video technology.  Names of all persons participating in this telemedicine service and their role in this encounter. Name: [name]  Role: Patient   Name: Ophelia Shoulder Role: PMHNP  Name: Patrick North Role: Psychiatrist  Name: Rosalio Loud Role: Monitor patient    Chales Abrahams, NP 03/17/2023 7:53 PM

## 2023-03-17 NOTE — ED Provider Notes (Signed)
[name] EMERGENCY DEPARTMENT AT [facility] Provider Note   CSN: [id number] Arrival date & time: 03/16/23  2357     History  Chief Complaint  Patient presents with   Drug Overdose   Suicide Attempt    Sharon Ward is a 27 y.o. female.  Level 5 caveat for psychiatric illness.  Patient brought in by EMS after apparent suicide attempt.  She was texting her boyfriend stating that she wanted to end her life.  She took an unknown amount of gabapentin and trazodone (correction from triage note, not tramadol).  Around 10:30 PM.  She is screaming and combative on arrival.  She is not cooperative.  Unable to give any history.  Denies any pain.  Stable vital signs for EMS.  The history is provided by the EMS personnel and the patient. The history is limited by the condition of the patient.  Drug Overdose       Home Medications Prior to Admission medications   Medication Sig Start Date End Date Taking? Authorizing Provider  DULoxetine (CYMBALTA) 60 MG capsule Take 1 capsule (60 mg total) by mouth daily. 01/07/23   Sarita Bottom, MD  gabapentin (NEURONTIN) 100 MG capsule Take 3 capsules (300 mg total) by mouth 3 (three) times daily. 01/06/23   Sarita Bottom, MD  nicotine polacrilex (NICORETTE) 2 MG gum Take 1 each (2 mg total) by mouth as needed for smoking cessation. 01/06/23   Sarita Bottom, MD  QUEtiapine (SEROQUEL) 50 MG tablet Take 1 tablet (50 mg total) by mouth at bedtime. 01/06/23   Sarita Bottom, MD  traZODone (DESYREL) 50 MG tablet Take 1 tablet (50 mg total) by mouth at bedtime as needed for sleep. 01/06/23   Sarita Bottom, MD      Allergies    Ascorbate, Citrus, Coconut flavor, Lamotrigine, Orange (diagnostic), Peach flavor, Pear, and Pineapple    Review of Systems   Review of Systems  Unable to perform ROS: Psychiatric disorder    Physical Exam Updated Vital Signs BP 120/70   Pulse 84   Temp (!) 97.5 F (36.4 C) (Oral)   Resp 15   SpO2 92%  Physical  Exam Vitals and nursing note reviewed.  Constitutional:      General: She is not in acute distress.    Appearance: She is well-developed.     Comments: Screaming and combative, moving all extremities  HENT:     Head: Normocephalic and atraumatic.     Mouth/Throat:     Pharynx: No oropharyngeal exudate.  Eyes:     Conjunctiva/sclera: Conjunctivae normal.     Pupils: Pupils are equal, round, and reactive to light.  Neck:     Comments: No meningismus. Cardiovascular:     Rate and Rhythm: Normal rate and regular rhythm.     Heart sounds: Normal heart sounds. No murmur heard. Pulmonary:     Effort: Pulmonary effort is normal. No respiratory distress.     Breath sounds: Normal breath sounds.  Chest:     Chest wall: No tenderness.  Abdominal:     Palpations: Abdomen is soft.     Tenderness: There is no abdominal tenderness. There is no guarding or rebound.  Musculoskeletal:        General: No tenderness. Normal range of motion.     Cervical back: Normal range of motion and neck supple.  Skin:    General: Skin is warm.     Capillary Refill: Capillary refill takes less than 2 seconds.  Neurological:  Mental Status: She is alert.     Cranial Nerves: No cranial nerve deficit.     Motor: No abnormal muscle tone.     Coordination: Coordination normal.     Comments: Not cooperative, screaming, follows commands.  Oriented to person and place  Psychiatric:        Behavior: Behavior normal.     ED Results / Procedures / Treatments   Labs (all labs ordered are listed, but only abnormal results are displayed) Labs Reviewed  COMPREHENSIVE METABOLIC PANEL - Abnormal; Notable for the following components:      Result Value   CO2 21 (*)    All other components within normal limits  ACETAMINOPHEN LEVEL - Abnormal; Notable for the following components:   Acetaminophen (Tylenol), Serum <10 (*)    All other components within normal limits  SALICYLATE LEVEL - Abnormal; Notable for the  following components:   Salicylate Lvl <7.0 (*)    All other components within normal limits  CBC WITH DIFFERENTIAL/PLATELET  ETHANOL  HCG, SERUM, QUALITATIVE  MAGNESIUM  RAPID URINE DRUG SCREEN, HOSP PERFORMED  URINALYSIS, ROUTINE W REFLEX MICROSCOPIC    EKG EKG Interpretation Date/Time:  Thursday March 17 2023 01:17:07 EDT Ventricular Rate:  100 PR Interval:  149 QRS Duration:  81 QT Interval:  408 QTC Calculation: 527 R Axis:   -6  Text Interpretation: Sinus tachycardia Borderline T abnormalities, anterior leads Prolonged QT interval Prolonged QT Confirmed by Glynn Octave [phone]) on 03/17/2023 1:30:48 AM  Radiology No results found.  Procedures .Critical Care  Performed by: Glynn Octave, MD Authorized by: Glynn Octave, MD   Critical care provider statement:    Critical care time (minutes):  35   Critical care time was exclusive of:  Separately billable procedures and treating other patients   Critical care was necessary to treat or prevent imminent or life-threatening deterioration of the following conditions: aggressive behavior, drug overdose, suicide attempt.   Critical care was time spent personally by me on the following activities:  Development of treatment plan with patient or surrogate, discussions with consultants, evaluation of patient's response to treatment, examination of patient, ordering and review of laboratory studies, ordering and review of radiographic studies, ordering and performing treatments and interventions, pulse oximetry, re-evaluation of patient's condition, review of old charts, blood draw for specimens and obtaining history from patient or surrogate   I assumed direction of critical care for this patient from another provider in my specialty: no     Care discussed with: admitting provider       Medications Ordered in ED Medications  ziprasidone (GEODON) injection 20 mg (has no administration in time range)  LORazepam (ATIVAN)  injection 2 mg (has no administration in time range)  diphenhydrAMINE (BENADRYL) injection 50 mg (has no administration in time range)    ED Course/ Medical Decision Making/ A&P                                 Medical Decision Making Amount and/or Complexity of Data Reviewed Independent Historian: EMS Labs: ordered. Decision-making details documented in ED Course. Radiology: ordered and independent interpretation performed. Decision-making details documented in ED Course. ECG/medicine tests: ordered and independent interpretation performed. Decision-making details documented in ED Course.  Risk Prescription drug management.   Intentional overdose of apparent gabapentin and trazodone.  Vital stable for EMS.  On arrival she is screaming and combative.  Patient requiring chemical and physical restraint  for patient and staff safety.  IVC paperwork completed.  Will check labs.  IV fluids given.  EKG sinus tachycardia with borderline prolonged QT of 527.  Discussed with poison control.  They recommend 6 hours of monitoring.  Supportive care for trazodone and gabapentin ingestion.  Recommend repeat EKG and maintain normal potassium and magnesium.  Screening labs are reassuring.  Potassium and magnesium levels are normal.  EKG has improved QT interval on recheck.  After 6 hours of observation no deterioration in patient's status.  She remains obtunded after receiving sedation medications.  Will allow to metabolize.  She is medically cleared for TTS evaluation.  Holding orders are placed.  Holding her home gabapentin and trazodone in setting of overdose.          Final Clinical Impression(s) / ED Diagnoses Final diagnoses:  None    Rx / DC Orders ED Discharge Orders     None         Quintin Hjort, Jeannett Senior, MD 03/17/23 [phone]

## 2023-03-18 ENCOUNTER — Other Ambulatory Visit: Payer: Self-pay

## 2023-03-18 ENCOUNTER — Encounter: Payer: Self-pay | Admitting: Psychiatry

## 2023-03-18 ENCOUNTER — Inpatient Hospital Stay
Admission: RE | Admit: 2023-03-18 | Discharge: 2023-03-22 | DRG: 885 | Disposition: A | Discharge status: Home / Self Care | Payer: MEDICAID | Source: Intra-hospital | Attending: Psychiatry | Admitting: Psychiatry

## 2023-03-18 DIAGNOSIS — Z825 Family history of asthma and other chronic lower respiratory diseases: Secondary | ICD-10-CM | POA: Diagnosis not present

## 2023-03-18 DIAGNOSIS — F411 Generalized anxiety disorder: Secondary | ICD-10-CM | POA: Diagnosis present

## 2023-03-18 DIAGNOSIS — F332 Major depressive disorder, recurrent severe without psychotic features: Secondary | ICD-10-CM | POA: Diagnosis not present

## 2023-03-18 DIAGNOSIS — Z823 Family history of stroke: Secondary | ICD-10-CM | POA: Diagnosis not present

## 2023-03-18 DIAGNOSIS — Z602 Problems related to living alone: Secondary | ICD-10-CM | POA: Diagnosis present

## 2023-03-18 DIAGNOSIS — R4587 Impulsiveness: Secondary | ICD-10-CM | POA: Diagnosis present

## 2023-03-18 DIAGNOSIS — Z833 Family history of diabetes mellitus: Secondary | ICD-10-CM | POA: Diagnosis not present

## 2023-03-18 DIAGNOSIS — Z8249 Family history of ischemic heart disease and other diseases of the circulatory system: Secondary | ICD-10-CM | POA: Diagnosis not present

## 2023-03-18 DIAGNOSIS — K0889 Other specified disorders of teeth and supporting structures: Secondary | ICD-10-CM | POA: Diagnosis present

## 2023-03-18 DIAGNOSIS — Z9889 Other specified postprocedural states: Secondary | ICD-10-CM | POA: Diagnosis not present

## 2023-03-18 DIAGNOSIS — Z9151 Personal history of suicidal behavior: Secondary | ICD-10-CM | POA: Diagnosis not present

## 2023-03-18 DIAGNOSIS — Z91018 Allergy to other foods: Secondary | ICD-10-CM | POA: Diagnosis not present

## 2023-03-18 DIAGNOSIS — Z555 Less than a high school diploma: Secondary | ICD-10-CM

## 2023-03-18 DIAGNOSIS — Z62819 Personal history of unspecified abuse in childhood: Secondary | ICD-10-CM

## 2023-03-18 DIAGNOSIS — Z91414 Personal history of adult intimate partner abuse: Secondary | ICD-10-CM | POA: Diagnosis not present

## 2023-03-18 DIAGNOSIS — F3163 Bipolar disorder, current episode mixed, severe, without psychotic features: Principal | ICD-10-CM | POA: Diagnosis present

## 2023-03-18 DIAGNOSIS — T50902A Poisoning by unspecified drugs, medicaments and biological substances, intentional self-harm, initial encounter: Secondary | ICD-10-CM | POA: Diagnosis present

## 2023-03-18 DIAGNOSIS — F4310 Post-traumatic stress disorder, unspecified: Secondary | ICD-10-CM | POA: Diagnosis present

## 2023-03-18 DIAGNOSIS — Z79899 Other long term (current) drug therapy: Secondary | ICD-10-CM | POA: Diagnosis not present

## 2023-03-18 DIAGNOSIS — G4700 Insomnia, unspecified: Secondary | ICD-10-CM | POA: Diagnosis present

## 2023-03-18 DIAGNOSIS — Z87730 Personal history of (corrected) cleft lip and palate: Secondary | ICD-10-CM

## 2023-03-18 DIAGNOSIS — Z716 Tobacco abuse counseling: Secondary | ICD-10-CM

## 2023-03-18 DIAGNOSIS — F17210 Nicotine dependence, cigarettes, uncomplicated: Secondary | ICD-10-CM | POA: Diagnosis present

## 2023-03-18 DIAGNOSIS — F329 Major depressive disorder, single episode, unspecified: Secondary | ICD-10-CM | POA: Diagnosis present

## 2023-03-18 DIAGNOSIS — J45909 Unspecified asthma, uncomplicated: Secondary | ICD-10-CM | POA: Diagnosis present

## 2023-03-18 MED ORDER — GABAPENTIN 300 MG PO CAPS
300.0000 mg | ORAL_CAPSULE | Freq: Three times a day (TID) | ORAL | Status: DC
  Administered 2023-03-18 – 2023-03-22 (×11): 300 mg via ORAL
  Filled 2023-03-18 (×11): qty 1

## 2023-03-18 MED ORDER — LORAZEPAM 2 MG PO TABS: 2.0000 mg | ORAL_TABLET | Freq: Three times a day (TID) | ORAL | Status: DC | PRN

## 2023-03-18 MED ORDER — DIPHENHYDRAMINE HCL 50 MG/ML IJ SOLN: 50.0000 mg | Freq: Three times a day (TID) | INTRAMUSCULAR | Status: DC | PRN

## 2023-03-18 MED ORDER — HYDROXYZINE HCL 25 MG PO TABS: 25.0000 mg | ORAL_TABLET | Freq: Three times a day (TID) | ORAL | Status: DC | PRN

## 2023-03-18 MED ORDER — LORAZEPAM 2 MG/ML IJ SOLN: 2.0000 mg | Freq: Three times a day (TID) | INTRAMUSCULAR | Status: DC | PRN

## 2023-03-18 MED ORDER — ACETAMINOPHEN 325 MG PO TABS
650.0000 mg | ORAL_TABLET | Freq: Four times a day (QID) | ORAL | Status: DC | PRN
  Administered 2023-03-18: 650 mg via ORAL
  Filled 2023-03-18: qty 2

## 2023-03-18 MED ORDER — DULOXETINE HCL 30 MG PO CPEP
60.0000 mg | ORAL_CAPSULE | Freq: Every day | ORAL | Status: DC
  Administered 2023-03-19 – 2023-03-22 (×4): 60 mg via ORAL
  Filled 2023-03-18 (×4): qty 2

## 2023-03-18 MED ORDER — MAGNESIUM HYDROXIDE 400 MG/5ML PO SUSP: 30.0000 mL | Freq: Every day | ORAL | Status: DC | PRN

## 2023-03-18 MED ORDER — TRAZODONE HCL 50 MG PO TABS: 50.0000 mg | ORAL_TABLET | Freq: Every evening | ORAL | Status: DC | PRN

## 2023-03-18 MED ORDER — QUETIAPINE FUMARATE 25 MG PO TABS
50.0000 mg | ORAL_TABLET | Freq: Every day | ORAL | Status: DC
  Administered 2023-03-18 – 2023-03-21 (×4): 50 mg via ORAL
  Filled 2023-03-18 (×4): qty 2

## 2023-03-18 MED ORDER — ALUM & MAG HYDROXIDE-SIMETH 200-200-20 MG/5ML PO SUSP: 30.0000 mL | ORAL | Status: DC | PRN

## 2023-03-18 MED ORDER — DIPHENHYDRAMINE HCL 25 MG PO CAPS: 50.0000 mg | ORAL_CAPSULE | Freq: Three times a day (TID) | ORAL | Status: DC | PRN

## 2023-03-18 NOTE — ED Provider Notes (Signed)
Emergency Medicine Observation Re-evaluation Note  Sharon Ward is a 27 y.o. female, seen on rounds today.  Pt initially presented to the ED for complaints of Drug Overdose and Suicide Attempt Currently, the patient is resting comfortably .  She is in the hallway.  She is requesting that she be allowed to gargle salt water as she recently had dental procedure.  Physical Exam  BP 114/72   Pulse 66   Temp (!) 97.5 F (36.4 C) (Oral)   Resp 18   SpO2 100%  Physical Exam General: No acute distress Cardiac: Regular rate Lungs: No respiratory distress Psych: Currently calm  ED Course / MDM  EKG:EKG Interpretation Date/Time:  Thursday March 17 2023 04:09:27 EDT Ventricular Rate:  76 PR Interval:  154 QRS Duration:  97 QT Interval:  427 QTC Calculation: 481 R Axis:   5  Text Interpretation: Sinus rhythm Borderline prolonged QT interval No significant change was found Confirmed by Glynn Octave [phone]) on 03/17/2023 4:11:38 AM  I have reviewed the labs performed to date as well as medications administered while in observation.  Recent changes in the last 24 hours include -no new changes.  Plan  Current plan is for patient to be held in the ER for psych stabilization.    Derwood Kaplan, MD 03/18/23 [phone]

## 2023-03-18 NOTE — Progress Notes (Signed)
03/17/23- Per [facility] Delena Bali pt is under review for [facility] 03/18/23. [facility] will follow up with CSW/ Disposition team and care team.   Maryjean Ka, MSW, [facility] 03/18/2023 3:40 AM

## 2023-03-18 NOTE — Group Note (Signed)
Date:  03/18/2023 Time:  4:10 PM  Group Topic/Focus:  Coping With Mental Health Crisis:   The purpose of this group is to help patients identify strategies for coping with mental health crisis.  Group discusses possible causes of crisis and ways to manage them effectively. Healthy Communication:   The focus of this group is to discuss communication, barriers to communication, as well as healthy ways to communicate with others.     Participation Level:  Did Not Attend  Participation Quality:    Affect:    Cognitive:    Insight:   Engagement in Group:    Modes of Intervention:    Additional Comments:    Shley Dolby L Maynard David 03/18/2023, 4:10 PM

## 2023-03-18 NOTE — Progress Notes (Signed)
Pt has been needy with staff. She threatened to become agitated when she did not receive her clothing items as timely as she desires. Pt instructed that staff will accommodate her as soon as time allows
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
## 2023-03-18 NOTE — Progress Notes (Signed)
Pt was accepted to [facility] BMU TODAY 03/18/2023. Bed assignment: 310  Pt meets inpatient criteria per Alona Bene, PMHNP  Attending Physician will be Elane Fritz, DO  Report can be called to: [phone]  Pt can arrive anytime  Care Team Notified: Malva Limes, RN, Berton Lan, RN, Althea Charon, RN, and Alona Bene, PMHNP  Cathie Beams, LCSW  03/18/2023 3:20 PM

## 2023-03-19 DIAGNOSIS — F332 Major depressive disorder, recurrent severe without psychotic features: Secondary | ICD-10-CM

## 2023-03-19 MED ORDER — NICOTINE 21 MG/24HR TD PT24
21.0000 mg | MEDICATED_PATCH | TRANSDERMAL | Status: DC
  Administered 2023-03-19: 21 mg via TRANSDERMAL

## 2023-03-19 MED ORDER — ACETAMINOPHEN 500 MG PO TABS
1000.0000 mg | ORAL_TABLET | Freq: Three times a day (TID) | ORAL | Status: DC | PRN
  Administered 2023-03-19: 1000 mg via ORAL
  Filled 2023-03-19: qty 2

## 2023-03-19 MED ORDER — IBUPROFEN 600 MG PO TABS: 800.0000 mg | ORAL_TABLET | Freq: Four times a day (QID) | ORAL | Status: DC | PRN

## 2023-03-19 NOTE — Group Note (Signed)
Date:  03/19/2023 Time:  2:23 AM  Group Topic/Focus:  Wrap-Up Group:   The focus of this group is to help patients review their daily goal of treatment and discuss progress on daily workbooks.    Participation Level:  Active  Participation Quality:  Appropriate  Affect:  Appropriate  Cognitive:  Alert  Insight: Appropriate and Good  Engagement in Group:  Engaged  Modes of Intervention:  Activity  Additional Comments:     Maglione,Neale Marzette E 03/19/2023, 2:23 AM
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
## 2023-03-19 NOTE — Progress Notes (Signed)
Nursing Shift Note:  1900-0700  Attended Evening Group: yes Medication Compliant: yes   Behavior: cooperative and social Sleep Quality: good Significant Changes: n/a   The patient watched television and was social with peers. Medications accepted as ordered.

## 2023-03-19 NOTE — Group Note (Signed)
Date:  03/19/2023 Time:  4:53 PM  Group Topic/Focus:  Goals Group:   The focus of this group is to help patients establish daily goals to achieve during treatment and discuss how the patient can incorporate goal setting into their daily lives to aide in recovery. Making Healthy Choices:   The focus of this group is to help patients identify negative/unhealthy choices they were using prior to admission and identify positive/healthier coping strategies to replace them upon discharge.    Participation Level:  Active  Participation Quality:  Appropriate  Affect:  Appropriate  Cognitive:  Appropriate  Insight: Appropriate  Engagement in Group:  Engaged  Modes of Intervention:  Activity, Discussion, and Education  Additional Comments:    Wilford Corner 03/19/2023, 4:53 PM

## 2023-03-19 NOTE — Group Note (Signed)
Date:  03/19/2023 Time:  11:16 AM  Group Topic/Focus:  Therapeutic Outdoor Recreation Activity.    Participation Level:  Minimal  Participation Quality:  Appropriate and Attentive  Affect:  Appropriate  Cognitive:  Alert and Appropriate  Insight: Appropriate  Engagement in Group:  Developing/Improving and Limited  Modes of Intervention:  Socialization and Support  Additional Comments:    Rosaura Carpenter 03/19/2023, 11:16 AM

## 2023-03-19 NOTE — Group Note (Signed)
Date:  03/19/2023 Time:  9:29 PM  Group Topic/Focus:  Wrap-Up Group:   The focus of this group is to help patients review their daily goal of treatment and discuss progress on daily workbooks.    Participation Level:  Active  Participation Quality:  Appropriate  Affect:  Appropriate  Cognitive:  Alert  Insight: Appropriate  Engagement in Group:  Engaged  Modes of Intervention:  Activity  Additional Comments:     Maglione,Cori Justus E 03/19/2023, 9:29 PM

## 2023-03-19 NOTE — Progress Notes (Signed)
D- Patient alert and oriented. Affect/mood. Denies SI, HI, AVH, and pain. Quotes. Goal. How did they do with achieving previous goal. A- Scheduled medications administered to patient, per MD orders. Support and encouragement provided.  Routine safety checks conducted every 15 minutes.  Patient informed to notify staff with problems or concerns. R- No adverse drug reactions noted. Patient contracts for safety at this time. Patient compliant with medications and treatment plan. Patient receptive, calm, and cooperative withdrawn at times.Patient interacts well with others on the unit.  Patient remains safe at this time.
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
## 2023-03-19 NOTE — Group Note (Signed)
Date:  03/19/2023 Time:  6:35 PM  Group Topic/Focus:  Activity Group    Participation Level:  Active  Participation Quality:  Appropriate  Affect:  Appropriate  Cognitive:  Appropriate  Insight: Appropriate  Engagement in Group:  Engaged  Modes of Intervention:  Activity  Additional Comments:    Wilford Corner 03/19/2023, 6:35 PM

## 2023-03-20 DIAGNOSIS — F332 Major depressive disorder, recurrent severe without psychotic features: Secondary | ICD-10-CM | POA: Diagnosis not present

## 2023-03-20 LAB — LIPID PANEL
Cholesterol: 158 mg/dL (ref 0–200)
HDL: 53 mg/dL (ref >40)
LDL Cholesterol: 90 mg/dL (ref 0–99)
Total CHOL/HDL Ratio: 3 ratio
Triglycerides: 76 mg/dL (ref <150)
VLDL: 15 mg/dL (ref 0–40)

## 2023-03-20 LAB — TSH: TSH: 2.282 u[IU]/mL (ref 0.350–4.500)

## 2023-03-20 NOTE — Progress Notes (Signed)
D- Patient alert and oriented x 4. Affect flat/mood sad and irritable. Denies SI/ HI/ AVH. Patient complains of jaw pain secondary to extraction of wisdom teeth. She states "Tylenol doesn't help me". Patient denies depression and anxiety. Her goals today is to "make the negative things into a positive" and "be around positive people". A- Scheduled medications administered to patient, per MD orders. Support and encouragement provided.  Routine safety checks conducted every 15 minutes without incident.  Patient informed to notify staff with problems or concerns and verbalizes understanding. R- No adverse drug reactions noted.  Patient compliant with medications and treatment plan. Patient is cooperative and interacts well with others on the unit. Patient contracts for safety and  remains safe on the unit at this time.

## 2023-03-20 NOTE — Progress Notes (Signed)
Nursing Shift Note:  1900-0700  Attended Evening Group: yes Medication Compliant:  yes Behavior: cooperative, sad Sleep Quality: good Significant Changes: none  The patient denied symptoms of depression, hopelessness, and anxiety.  Mouth pain remains and treated with PRN medications and salt water rinse.  Sharon Ward denied suicidal ideation.

## 2023-03-20 NOTE — [facility] Counselor (Signed)
The patient spoke with the LCSWA about her concerns of not being out of hospital in time before she has to see her children. The patient stated that she has to be out for meeting with DSS. The patient request to see Dr. Tiajuana Amass thing in the morning to discuss discharge date.    Patric Dykes, LCSWA

## 2023-03-20 NOTE — Group Note (Signed)
Date:  03/20/2023 Time:  7:19 PM  Group Topic/Focus:  Activity/Outdoor Recreation    Participation Level:  Minimal  Participation Quality:  Appropriate  Affect:  Appropriate  Cognitive:  Appropriate  Insight: Appropriate  Engagement in Group:  Engaged  Modes of Intervention:  Activity  Additional Comments:    Sharon Ward 03/20/2023, 7:19 PM

## 2023-03-20 NOTE — Group Note (Signed)
Date:  03/20/2023 Time:  11:21 AM  Group Topic/Focus:  Goals Group:   The focus of this group is to help patients establish daily goals to achieve during treatment and discuss how the patient can incorporate goal setting into their daily lives to aide in recovery.    Participation Level:  Active  Participation Quality:  Appropriate  Affect:  Appropriate  Cognitive:  Appropriate  Insight: Appropriate  Engagement in Group:  Engaged  Modes of Intervention:  Discussion and Education  Additional Comments:    Wilford Corner 03/20/2023, 11:21 AM

## 2023-03-20 NOTE — Group Note (Signed)
LCSW Group Therapy Note  Group Date: 03/20/2023 Start Time: 1320 End Time: 1420   Type of Therapy and Topic:  Group Therapy -  Coping Skills  Participation Level:  Active   Description of Group The focus of this group was to determine what unhealthy coping techniques typically are used by group members and what healthy coping techniques would be helpful in coping with various problems. Patients were guided in becoming aware of the differences between healthy and unhealthy coping techniques. Patients were asked to identify 2-3 healthy coping skills they would like to learn to use more effectively.  Therapeutic Goals Patients learned that coping is what human beings do all day long to deal with various situations in their lives Patients defined and discussed healthy vs unhealthy coping techniques Patients identified their preferred coping techniques and identified whether these were healthy or unhealthy Patients determined 2-3 healthy coping skills they would like to become more familiar with and use more often. Patients provided support and ideas to each other   Summary of Patient Progress:  The patient attended group.  The patient proved open to input from peers and feedback from [facility]. Patient demonstrated insight into the subject matter, was respectful of peers, and participated throughout the entire session. The patient stated that she likes to take baths and light candles. The patient stated that she practices self care by going shopping for herself and children. The patient stated that she uses doll making as a distracting technique.      Marshell Levan, LCSWA 03/20/2023  3:07 PM

## 2023-03-20 NOTE — Plan of Care (Signed)
  Problem: Education: Goal: Knowledge of General Education information will improve Description: Including pain rating scale, medication(s)/side effects and non-pharmacologic comfort measures Outcome: Progressing   Problem: Health Behavior/Discharge Planning: Goal: Ability to manage health-related needs will improve Outcome: Progressing   Problem: Clinical Measurements: Goal: Ability to maintain clinical measurements within normal limits will improve Outcome: Progressing Goal: Will remain free from infection Outcome: Progressing Goal: Diagnostic test results will improve Outcome: Progressing Goal: Respiratory complications will improve Outcome: Progressing Goal: Cardiovascular complication will be avoided Outcome: Progressing   Problem: Activity: Goal: Risk for activity intolerance will decrease Outcome: Progressing   Problem: Nutrition: Goal: Adequate nutrition will be maintained Outcome: Progressing   Problem: Coping: Goal: Level of anxiety will decrease Outcome: Progressing   Problem: Elimination: Goal: Will not experience complications related to bowel motility Outcome: Progressing Goal: Will not experience complications related to urinary retention Outcome: Progressing   Problem: Pain Managment: Goal: General experience of comfort will improve Outcome: Progressing   Problem: Safety: Goal: Ability to remain free from injury will improve Outcome: Progressing   Problem: Skin Integrity: Goal: Risk for impaired skin integrity will decrease Outcome: Progressing   Problem: Education: Goal: Knowledge of [facility] Education information/materials will improve Outcome: Progressing Goal: Emotional status will improve Outcome: Progressing Goal: Mental status will improve Outcome: Progressing Goal: Verbalization of understanding the information provided will improve Outcome: Progressing   Problem: Activity: Goal: Interest or engagement in activities will  improve Outcome: Progressing Goal: Sleeping patterns will improve Outcome: Progressing   Problem: Coping: Goal: Ability to verbalize frustrations and anger appropriately will improve Outcome: Progressing Goal: Ability to demonstrate self-control will improve Outcome: Progressing   Problem: Health Behavior/Discharge Planning: Goal: Identification of resources available to assist in meeting health care needs will improve Outcome: Progressing Goal: Compliance with treatment plan for underlying cause of condition will improve Outcome: Progressing   Problem: Physical Regulation: Goal: Ability to maintain clinical measurements within normal limits will improve Outcome: Progressing   Problem: Safety: Goal: Periods of time without injury will increase Outcome: Progressing   

## 2023-03-20 NOTE — Group Note (Signed)
Date:  03/20/2023 Time:  11:31 AM  Group Topic/Focus:  Activity/Outdoor Recreation     Participation Level:  Active  Participation Quality:  Appropriate  Affect:  Appropriate  Cognitive:  Appropriate  Insight: Appropriate  Engagement in Group:  Engaged  Modes of Intervention:  Activity  Additional Comments:    Wilford Corner 03/20/2023, 11:31 AM

## 2023-03-20 NOTE — Progress Notes (Signed)
[facility] MD Progress Note  03/20/2023 11:15 AM Sharon Ward  MRN:  [id number]  Subjective:  Notes, labs, and vital signs reviewed prior to the assessment.  The client was outside conversing with others prior to interview.  She reported, "I feel a lot better than I was, needed a few days to deflect on things".  1/10 depression with 10 being the highest, denies suicidal ideations and stated it was impulsivity that triggered the overdose when her ex said negative things towards her.  Sleep is good except some early awakening.  Appetite is "pretty good" based on what she can manage with her mouth stitches from her wisdom tooth extraction last week.  Denies anxiety and side effects from her medications.  Principal Problem: Major depressive disorder, recurrent severe without psychotic features (HCC) Diagnosis: Principal Problem:   Major depressive disorder, recurrent severe without psychotic features (HCC)  Total Time spent with patient: 30 minutes  Past Psychiatric History: bipolar d/o, GAD  Past Medical History:  Past Medical History:  Diagnosis Date   Asthma    Bipolar 1 disorder, mixed (HCC)    Depression    Generalized anxiety disorder    Intentional drug overdose (HCC) 11/03/2020   Low-lying placenta 01/07/2022   Resolved 03/04/22   Relationship dysfunction    Suicide attempt (HCC) 11/02/2020    Past Surgical History:  Procedure Laterality Date   PILONIDAL CYST / SINUS EXCISION  09/11/2013   PILONIDAL CYST EXCISION  05/17/2014   Pilonidal cystectomy with cleft lip   Family History:  Family History  Problem Relation Age of Onset   Asthma Mother    Diabetes Mother    Healthy Mother    Hypertension Father    Asthma Father    Diabetes Father    Healthy Father    Asthma Brother    Hypertension Paternal Uncle    Diabetes Paternal Grandmother    Stroke Paternal Grandfather    Heart disease Paternal Grandfather    Hypertension Paternal Grandfather    Diabetes Paternal Grandfather     Family Psychiatric  History: none Social History:  Social History   Substance and Sexual Activity  Alcohol Use Not Currently   Comment: occasional prior to pregnancy     Social History   Substance and Sexual Activity  Drug Use Not Currently   Frequency: 4.0 times per week   Types: Marijuana   Comment: No Delta 9 since February 2023    Social History   Socioeconomic History   Marital status: Single    Spouse name: Not on file   Number of children: 1   Years of education: 10   Highest education level: 10th grade  Occupational History   Not on file  Tobacco Use   Smoking status: Former    Current packs/day: 0.00    Average packs/day: 1 pack/day for 15.0 years (15.0 ttl pk-yrs)    Types: Cigarettes    Start date: 07/14/2006    Quit date: 07/14/2021    Years since quitting: 1.6   Smokeless tobacco: Never   Tobacco comments:    only smoke a "couple" of cigarettes when stressed or anxious, socially with friends per [facility] chart  Vaping Use   Vaping status: Former   Start date: 08/10/2003   Quit date: 05/04/2021  Substance and Sexual Activity   Alcohol use: Not Currently    Comment: occasional prior to pregnancy   Drug use: Not Currently    Frequency: 4.0 times per week    Types: Marijuana  Comment: No Delta 9 since February 2023   Sexual activity: Not Currently    Partners: Male    Birth control/protection: None  Other Topics Concern   Not on file  Social History Narrative   Not on file   Social Determinants of Health   Financial Resource Strain: Not on file  Food Insecurity: No Food Insecurity (03/18/2023)   Hunger Vital Sign    Worried About Running Out of Food in the Last Year: Never true    Ran Out of Food in the Last Year: Never true  Transportation Needs: No Transportation Needs (03/18/2023)   PRAPARE - Administrator, Civil Service (Medical): No    Lack of Transportation (Non-Medical): No  Physical Activity: Not on file  Stress: Not on file   Social Connections: Not on file   Additional Social History: lives alone, children in DSS-sees them once a week on Wed.  Sleep: Good  Appetite:  Good  Current Medications: Current Facility-Administered Medications  Medication Dose Route Frequency Provider Last Rate Last Admin   acetaminophen (TYLENOL) tablet 1,000 mg  1,000 mg Oral Q8H PRN Charm Rings, NP   1,000 mg at 03/19/23 1829   alum & mag hydroxide-simeth (MAALOX/MYLANTA) 200-200-20 MG/5ML suspension 30 mL  30 mL Oral Q4H PRN Motley-Mangrum, Jadeka A, PMHNP       diphenhydrAMINE (BENADRYL) capsule 50 mg  50 mg Oral TID PRN Motley-Mangrum, Jadeka A, PMHNP       Or   diphenhydrAMINE (BENADRYL) injection 50 mg  50 mg Intramuscular TID PRN Motley-Mangrum, Jadeka A, PMHNP       DULoxetine (CYMBALTA) DR capsule 60 mg  60 mg Oral Daily Motley-Mangrum, Jadeka A, PMHNP   60 mg at 03/20/23 0859   gabapentin (NEURONTIN) capsule 300 mg  300 mg Oral TID Motley-Mangrum, Jadeka A, PMHNP   300 mg at 03/20/23 0859   hydrOXYzine (ATARAX) tablet 25 mg  25 mg Oral TID PRN Motley-Mangrum, Jadeka A, PMHNP       ibuprofen (ADVIL) tablet 800 mg  800 mg Oral Q6H PRN Charm Rings, NP       LORazepam (ATIVAN) tablet 2 mg  2 mg Oral TID PRN Motley-Mangrum, Geralynn Ochs A, PMHNP       Or   LORazepam (ATIVAN) injection 2 mg  2 mg Intramuscular TID PRN Motley-Mangrum, Jadeka A, PMHNP       magnesium hydroxide (MILK OF MAGNESIA) suspension 30 mL  30 mL Oral Daily PRN Motley-Mangrum, Jadeka A, PMHNP       nicotine (NICODERM CQ - dosed in mg/24 hours) patch 21 mg  21 mg Transdermal Q24H Charm Rings, NP   21 mg at 03/19/23 1423   QUEtiapine (SEROQUEL) tablet 50 mg  50 mg Oral QHS Motley-Mangrum, Jadeka A, PMHNP   50 mg at 03/19/23 2127   traZODone (DESYREL) tablet 50 mg  50 mg Oral QHS PRN Motley-Mangrum, Ezra Sites, PMHNP        Lab Results:  Results for orders placed or performed during the hospital encounter of 03/18/23 (from the past 48 hour(s))  TSH      Status: None   Collection Time: 03/20/23  6:55 AM  Result Value Ref Range   TSH 2.282 0.350 - 4.500 uIU/mL    Comment: Performed by a 3rd Generation assay with a functional sensitivity of <=0.01 uIU/mL. Performed at [facility], [address], [place], [place] [address]   Lipid panel     Status: None  Collection Time: 03/20/23  6:55 AM  Result Value Ref Range   Cholesterol 158 0 - 200 mg/dL   Triglycerides 76 <272 mg/dL   HDL 53 >53 mg/dL   Total CHOL/HDL Ratio 3.0 RATIO   VLDL 15 0 - 40 mg/dL   LDL Cholesterol 90 0 - 99 mg/dL    Comment:        Total Cholesterol/HDL:CHD Risk Coronary Heart Disease Risk Table                     Men   Women  1/2 Average Risk   3.4   3.3  Average Risk       5.0   4.4  2 X Average Risk   9.6   7.1  3 X Average Risk  23.4   11.0        Use the calculated Patient Ratio above and the CHD Risk Table to determine the patient's CHD Risk.        ATP III CLASSIFICATION (LDL):  <100     mg/dL   Optimal  664-403  mg/dL   Near or Above                    Optimal  130-159  mg/dL   Borderline  474-259  mg/dL   High  >563     mg/dL   Very High Performed at [facility], [address] [place], [place] [address]     Blood Alcohol level:  Lab Results  Component Value Date   [facility] <10 03/17/2023   ETH <10 12/31/2022    Metabolic Disorder Labs: Lab Results  Component Value Date   HGBA1C 5.2 10/19/2022   MPG 103 10/19/2022   MPG 97 10/04/2022   Lab Results  Component Value Date   PROLACTIN 48.1 (H) 11/10/2021   Lab Results  Component Value Date   CHOL 158 03/20/2023   TRIG 76 03/20/2023   HDL 53 03/20/2023   CHOLHDL 3.0 03/20/2023   VLDL 15 03/20/2023   LDLCALC 90 03/20/2023   LDLCALC 107 (H) 12/19/2022     Musculoskeletal: Strength & Muscle Tone: within normal limits Gait & Station: normal Patient leans: N/A  Psychiatric Specialty Exam: Physical Exam Vitals and nursing note reviewed.  Constitutional:       Appearance: Normal appearance.  HENT:     Head: Normocephalic.     Nose: Nose normal.  Pulmonary:     Effort: Pulmonary effort is normal.  Musculoskeletal:        General: Normal range of motion.     Cervical back: Normal range of motion.  Neurological:     General: No focal deficit present.     Mental Status: She is alert and oriented to person, place, and time.  Psychiatric:        Attention and Perception: Attention and perception normal.        Mood and Affect: Mood is depressed.        Speech: Speech normal.        Behavior: Behavior normal. Behavior is cooperative.        Thought Content: Thought content normal.        Cognition and Memory: Cognition and memory normal.        Judgment: Judgment is impulsive.     Review of Systems  HENT:         Tooth pain r/t wisdom teeth extraction last week  Psychiatric/Behavioral:  The patient is  nervous/anxious.   All other systems reviewed and are negative.   Blood pressure (!) 95/58, pulse 71, temperature 98.4 F (36.9 C), temperature source Oral, resp. rate 19, height 5\' 2"  (1.575 m), weight 92.5 kg, last menstrual period 03/16/2023, SpO2 99%, not currently breastfeeding.Body mass index is 37.31 kg/m.  General Appearance: Casual  Eye Contact:  Good  Speech:  Normal Rate  Volume:  Normal  Mood:  Depressed  Affect:  Congruent  Thought Process:  Coherent  Orientation:  Full (Time, Place, and Person)  Thought Content:  Logical  Suicidal Thoughts:  No  Homicidal Thoughts:  No  Memory:  Immediate;   Fair Recent;   Fair Remote;   Fair  Judgement:  Fair  Insight:  Fair  Psychomotor Activity:  Normal  Concentration:  Concentration: Fair and Attention Span: Fair  Recall:  Fair  Fund of Knowledge:  Good  Language:  Good  Akathisia:  No  Handed:  Right  AIMS (if indicated):     Assets:  Housing Leisure Time Physical Health Resilience Social Support  ADL's:  Intact  Cognition:  WNL  Sleep:         Physical  Exam: Physical Exam Vitals and nursing note reviewed.  Constitutional:      Appearance: Normal appearance.  HENT:     Head: Normocephalic.     Nose: Nose normal.  Pulmonary:     Effort: Pulmonary effort is normal.  Musculoskeletal:        General: Normal range of motion.     Cervical back: Normal range of motion.  Neurological:     General: No focal deficit present.     Mental Status: She is alert and oriented to person, place, and time.  Psychiatric:        Attention and Perception: Attention and perception normal.        Mood and Affect: Mood is depressed.        Speech: Speech normal.        Behavior: Behavior normal. Behavior is cooperative.        Thought Content: Thought content normal.        Cognition and Memory: Cognition and memory normal.        Judgment: Judgment is impulsive.    Review of Systems  HENT:         Tooth pain r/t wisdom teeth extraction last week  Psychiatric/Behavioral:  The patient is nervous/anxious.   All other systems reviewed and are negative.  Blood pressure (!) 95/58, pulse 71, temperature 98.4 F (36.9 C), temperature source Oral, resp. rate 19, height 5\' 2"  (1.575 m), weight 92.5 kg, last menstrual period 03/16/2023, SpO2 99%, not currently breastfeeding. Body mass index is 37.31 kg/m.   Treatment Plan Summary: Daily contact with patient to assess and evaluate symptoms and progress in treatment, Medication management, and Plan : Major depressive disorder, recurrent, severe without psychosis: Cymbalta 60 mg daily   Anxiety: Gabapentin 300 mg TID   Insomnia: Trazodone 50 mg daily at bedtime PRN Seroquel 50 mg daily at bedtime:  EKG WDL, lipid panel WDL, TSH WDL, and A1C awaiting results  Nanine Means, NP 03/20/2023, 11:15 AM

## 2023-03-21 DIAGNOSIS — F332 Major depressive disorder, recurrent severe without psychotic features: Secondary | ICD-10-CM | POA: Diagnosis not present

## 2023-03-21 LAB — HEMOGLOBIN A1C
Hgb A1c MFr Bld: 5.2 % (ref 4.8–5.6)
Mean Plasma Glucose: 103 mg/dL

## 2023-03-21 NOTE — BH IP Treatment Plan (Addendum)
Interdisciplinary Treatment and Diagnostic Plan Update  03/21/2023 Time of Session: 9:30AM Sharon Ward MRN: [id number]  Principal Diagnosis: Major depressive disorder, recurrent severe without psychotic features (HCC)  Secondary Diagnoses: Principal Problem:   Major depressive disorder, recurrent severe without psychotic features (HCC)   Current Medications:  Current Facility-Administered Medications  Medication Dose Route Frequency Provider Last Rate Last Admin   acetaminophen (TYLENOL) tablet 1,000 mg  1,000 mg Oral Q8H PRN Charm Rings, NP   1,000 mg at 03/19/23 1829   alum & mag hydroxide-simeth (MAALOX/MYLANTA) 200-200-20 MG/5ML suspension 30 mL  30 mL Oral Q4H PRN Motley-Mangrum, Jadeka A, PMHNP       diphenhydrAMINE (BENADRYL) capsule 50 mg  50 mg Oral TID PRN Motley-Mangrum, Geralynn Ochs A, PMHNP       Or   diphenhydrAMINE (BENADRYL) injection 50 mg  50 mg Intramuscular TID PRN Motley-Mangrum, Jadeka A, PMHNP       DULoxetine (CYMBALTA) DR capsule 60 mg  60 mg Oral Daily Motley-Mangrum, Jadeka A, PMHNP   60 mg at 03/21/23 0916   gabapentin (NEURONTIN) capsule 300 mg  300 mg Oral TID Motley-Mangrum, Jadeka A, PMHNP   300 mg at 03/21/23 1307   hydrOXYzine (ATARAX) tablet 25 mg  25 mg Oral TID PRN Motley-Mangrum, Jadeka A, PMHNP       ibuprofen (ADVIL) tablet 800 mg  800 mg Oral Q6H PRN Charm Rings, NP       LORazepam (ATIVAN) tablet 2 mg  2 mg Oral TID PRN Motley-Mangrum, Geralynn Ochs A, PMHNP       Or   LORazepam (ATIVAN) injection 2 mg  2 mg Intramuscular TID PRN Motley-Mangrum, Jadeka A, PMHNP       magnesium hydroxide (MILK OF MAGNESIA) suspension 30 mL  30 mL Oral Daily PRN Motley-Mangrum, Jadeka A, PMHNP       nicotine (NICODERM CQ - dosed in mg/24 hours) patch 21 mg  21 mg Transdermal Q24H Charm Rings, NP   21 mg at 03/19/23 1423   QUEtiapine (SEROQUEL) tablet 50 mg  50 mg Oral QHS Motley-Mangrum, Jadeka A, PMHNP   50 mg at 03/20/23 2156   traZODone (DESYREL) tablet 50 mg   50 mg Oral QHS PRN Motley-Mangrum, Jadeka A, PMHNP       PTA Medications: Medications Prior to Admission  Medication Sig Dispense Refill Last Dose   amoxicillin (AMOXIL) 500 MG tablet Take 500 mg by mouth every 6 (six) hours.      DULoxetine (CYMBALTA) 60 MG capsule Take 1 capsule (60 mg total) by mouth daily. 30 capsule 0    gabapentin (NEURONTIN) 100 MG capsule Take 3 capsules (300 mg total) by mouth 3 (three) times daily. 90 capsule 0    traZODone (DESYREL) 50 MG tablet Take 1 tablet (50 mg total) by mouth at bedtime as needed for sleep. 30 tablet 0     Patient Stressors:    Patient Strengths:    Treatment Modalities: Medication Management, Group therapy, Case management,  1 to 1 session with clinician, Psychoeducation, Recreational therapy.   Physician Treatment Plan for Primary Diagnosis: Major depressive disorder, recurrent severe without psychotic features (HCC) Long Term Goal(s): Improvement in symptoms so as ready for discharge   Short Term Goals: Ability to identify changes in lifestyle to reduce recurrence of condition will improve Ability to verbalize feelings will improve Ability to disclose and discuss suicidal ideas Ability to demonstrate self-control will improve Ability to identify and develop effective coping behaviors will improve Ability to maintain  clinical measurements within normal limits will improve Compliance with prescribed medications will improve Ability to identify triggers associated with substance abuse/mental health issues will improve  Medication Management: Evaluate patient's response, side effects, and tolerance of medication regimen.  Therapeutic Interventions: 1 to 1 sessions, Unit Group sessions and Medication administration.  Evaluation of Outcomes: Progressing  Physician Treatment Plan for Secondary Diagnosis: Principal Problem:   Major depressive disorder, recurrent severe without psychotic features (HCC)  Long Term Goal(s):  Improvement in symptoms so as ready for discharge   Short Term Goals: Ability to identify changes in lifestyle to reduce recurrence of condition will improve Ability to verbalize feelings will improve Ability to disclose and discuss suicidal ideas Ability to demonstrate self-control will improve Ability to identify and develop effective coping behaviors will improve Ability to maintain clinical measurements within normal limits will improve Compliance with prescribed medications will improve Ability to identify triggers associated with substance abuse/mental health issues will improve     Medication Management: Evaluate patient's response, side effects, and tolerance of medication regimen.  Therapeutic Interventions: 1 to 1 sessions, Unit Group sessions and Medication administration.  Evaluation of Outcomes: Progressing   RN Treatment Plan for Primary Diagnosis: Major depressive disorder, recurrent severe without psychotic features (HCC) Long Term Goal(s): Knowledge of disease and therapeutic regimen to maintain health will improve  Short Term Goals: Ability to demonstrate self-control, Ability to participate in decision making will improve, Ability to verbalize feelings will improve, Ability to disclose and discuss suicidal ideas, Ability to identify and develop effective coping behaviors will improve, and Compliance with prescribed medications will improve  Medication Management: RN will administer medications as ordered by provider, will assess and evaluate patient's response and provide education to patient for prescribed medication. RN will report any adverse and/or side effects to prescribing provider.  Therapeutic Interventions: 1 on 1 counseling sessions, Psychoeducation, Medication administration, Evaluate responses to treatment, Monitor vital signs and CBGs as ordered, Perform/monitor CIWA, COWS, AIMS and Fall Risk screenings as ordered, Perform wound care treatments as  ordered.  Evaluation of Outcomes: Progressing   LCSW Treatment Plan for Primary Diagnosis: Major depressive disorder, recurrent severe without psychotic features (HCC) Long Term Goal(s): Safe transition to appropriate next level of care at discharge, Engage patient in therapeutic group addressing interpersonal concerns.  Short Term Goals: Engage patient in aftercare planning with referrals and resources, Increase social support, Increase ability to appropriately verbalize feelings, Increase emotional regulation, Facilitate acceptance of mental health diagnosis and concerns, and Increase skills for wellness and recovery  Therapeutic Interventions: Assess for all discharge needs, 1 to 1 time with Social worker, Explore available resources and support systems, Assess for adequacy in community support network, Educate family and significant other(s) on suicide prevention, Complete Psychosocial Assessment, Interpersonal group therapy.  Evaluation of Outcomes: Progressing   Progress in Treatment: Attending groups: Yes. Participating in groups: Yes. Taking medication as prescribed: Yes. Toleration medication: Yes. Family/Significant other contact made: No, will contact:  once permission is given. Patient understands diagnosis: Yes. Discussing patient identified problems/goals with staff: Yes. Medical problems stabilized or resolved: Yes. Denies suicidal/homicidal ideation: Yes. Issues/concerns per patient self-inventory: No. Other: none  New problem(s) identified: No, Describe:  none  New Short Term/Long Term Goal(s): detox, elimination of symptoms of psychosis, medication management for mood stabilization; elimination of SI thoughts; development of comprehensive mental wellness/sobriety plan.   Patient Goals:  "try to stay around positive people here and when I get out stay away from toxic people, and get more  engaged with my ACT team  Discharge Plan or Barriers: CSW to assist in the  development of appropriate discharge plans.   Reason for Continuation of Hospitalization: Anxiety Depression Medical Issues Medication stabilization Suicidal ideation  Estimated Length of Stay:  1-7 days  Last 3 [name] Suicide Severity Risk Score: Flowsheet Row Admission (Current) from 03/18/2023 in [facility] ED from 03/17/2023 in [facility]) [facility]  C-SSRS RISK CATEGORY High Risk High Risk High Risk       Last PHQ 2/9 Scores:    09/16/2022    4:15 PM 08/21/2022   12:43 AM 08/18/2022    3:46 PM  Depression screen PHQ 2/9  Decreased Interest 3 2 2   Down, Depressed, Hopeless 3 2 2   PHQ - 2 Score 6 4 4   Altered sleeping 3 1 3   Tired, decreased energy 3 1 2   Change in appetite 3 1 3   Feeling bad or failure about yourself  3 1 3   Trouble concentrating 3 1 0  Moving slowly or fidgety/restless 0 0 0  Suicidal thoughts 0 1 0  PHQ-9 Score 21 10 15   Difficult doing work/chores  Very difficult     Scribe for Treatment Team: Harden Mo, LCSW 03/21/2023 1:40 PM

## 2023-03-21 NOTE — Progress Notes (Signed)
[facility] MD Progress Note  03/21/2023 11:04 AM Sharon Ward  MRN:  [id number]  Subjective:  Notes, labs, and vital signs reviewed prior to the assessment.  The client seen in treatment team with her goal "try and stay around positive people".  She denied depression and anxiety today and hoping discharge tomorrow to see her children on Wednesday in foster care as it is the only day she can see them.  Sleep is good along with appetite, her tooth is not bothering her like it was.  Denies side effects from her medications and plans to follow up with the ACT team after discharge.  Principal Problem: Major depressive disorder, recurrent severe without psychotic features (HCC) Diagnosis: Principal Problem:   Major depressive disorder, recurrent severe without psychotic features (HCC)  Total Time spent with patient: 30 minutes  Past Psychiatric History: bipolar d/o, GAD  Past Medical History:  Past Medical History:  Diagnosis Date   Asthma    Bipolar 1 disorder, mixed (HCC)    Depression    Generalized anxiety disorder    Intentional drug overdose (HCC) 11/03/2020   Low-lying placenta 01/07/2022   Resolved 03/04/22   Relationship dysfunction    Suicide attempt (HCC) 11/02/2020    Past Surgical History:  Procedure Laterality Date   PILONIDAL CYST / SINUS EXCISION  09/11/2013   PILONIDAL CYST EXCISION  05/17/2014   Pilonidal cystectomy with cleft lip   Family History:  Family History  Problem Relation Age of Onset   Asthma Mother    Diabetes Mother    Healthy Mother    Hypertension Father    Asthma Father    Diabetes Father    Healthy Father    Asthma Brother    Hypertension Paternal Uncle    Diabetes Paternal Grandmother    Stroke Paternal Grandfather    Heart disease Paternal Grandfather    Hypertension Paternal Grandfather    Diabetes Paternal Grandfather    Family Psychiatric  History: none Social History:  Social History   Substance and Sexual Activity  Alcohol Use Not  Currently   Comment: occasional prior to pregnancy     Social History   Substance and Sexual Activity  Drug Use Not Currently   Frequency: 4.0 times per week   Types: Marijuana   Comment: No Delta 9 since February 2023    Social History   Socioeconomic History   Marital status: Single    Spouse name: Not on file   Number of children: 1   Years of education: 10   Highest education level: 10th grade  Occupational History   Not on file  Tobacco Use   Smoking status: Former    Current packs/day: 0.00    Average packs/day: 1 pack/day for 15.0 years (15.0 ttl pk-yrs)    Types: Cigarettes    Start date: 07/14/2006    Quit date: 07/14/2021    Years since quitting: 1.6   Smokeless tobacco: Never   Tobacco comments:    only smoke a "couple" of cigarettes when stressed or anxious, socially with friends per [facility] chart  Vaping Use   Vaping status: Former   Start date: 08/10/2003   Quit date: 05/04/2021  Substance and Sexual Activity   Alcohol use: Not Currently    Comment: occasional prior to pregnancy   Drug use: Not Currently    Frequency: 4.0 times per week    Types: Marijuana    Comment: No Delta 9 since February 2023   Sexual activity: Not Currently  Partners: Male    Birth control/protection: None  Other Topics Concern   Not on file  Social History Narrative   Not on file   Social Determinants of Health   Financial Resource Strain: Not on file  Food Insecurity: No Food Insecurity (03/18/2023)   Hunger Vital Sign    Worried About Running Out of Food in the Last Year: Never true    Ran Out of Food in the Last Year: Never true  Transportation Needs: No Transportation Needs (03/18/2023)   PRAPARE - Administrator, Civil Service (Medical): No    Lack of Transportation (Non-Medical): No  Physical Activity: Not on file  Stress: Not on file  Social Connections: Not on file   Additional Social History: lives alone, children in DSS-sees them once a week on  Wed.  Sleep: Good  Appetite:  Good  Current Medications: Current Facility-Administered Medications  Medication Dose Route Frequency Provider Last Rate Last Admin   acetaminophen (TYLENOL) tablet 1,000 mg  1,000 mg Oral Q8H PRN Charm Rings, NP   1,000 mg at 03/19/23 1829   alum & mag hydroxide-simeth (MAALOX/MYLANTA) 200-200-20 MG/5ML suspension 30 mL  30 mL Oral Q4H PRN Motley-Mangrum, Jadeka A, PMHNP       diphenhydrAMINE (BENADRYL) capsule 50 mg  50 mg Oral TID PRN Motley-Mangrum, Jadeka A, PMHNP       Or   diphenhydrAMINE (BENADRYL) injection 50 mg  50 mg Intramuscular TID PRN Motley-Mangrum, Jadeka A, PMHNP       DULoxetine (CYMBALTA) DR capsule 60 mg  60 mg Oral Daily Motley-Mangrum, Jadeka A, PMHNP   60 mg at 03/21/23 0916   gabapentin (NEURONTIN) capsule 300 mg  300 mg Oral TID Motley-Mangrum, Jadeka A, PMHNP   300 mg at 03/21/23 0916   hydrOXYzine (ATARAX) tablet 25 mg  25 mg Oral TID PRN Motley-Mangrum, Jadeka A, PMHNP       ibuprofen (ADVIL) tablet 800 mg  800 mg Oral Q6H PRN Charm Rings, NP       LORazepam (ATIVAN) tablet 2 mg  2 mg Oral TID PRN Motley-Mangrum, Geralynn Ochs A, PMHNP       Or   LORazepam (ATIVAN) injection 2 mg  2 mg Intramuscular TID PRN Motley-Mangrum, Jadeka A, PMHNP       magnesium hydroxide (MILK OF MAGNESIA) suspension 30 mL  30 mL Oral Daily PRN Motley-Mangrum, Jadeka A, PMHNP       nicotine (NICODERM CQ - dosed in mg/24 hours) patch 21 mg  21 mg Transdermal Q24H Charm Rings, NP   21 mg at 03/19/23 1423   QUEtiapine (SEROQUEL) tablet 50 mg  50 mg Oral QHS Motley-Mangrum, Jadeka A, PMHNP   50 mg at 03/20/23 2156   traZODone (DESYREL) tablet 50 mg  50 mg Oral QHS PRN Motley-Mangrum, Ezra Sites, PMHNP        Lab Results:  Results for orders placed or performed during the hospital encounter of 03/18/23 (from the past 48 hour(s))  TSH     Status: None   Collection Time: 03/20/23  6:55 AM  Result Value Ref Range   TSH 2.282 0.350 - 4.500 uIU/mL     Comment: Performed by a 3rd Generation assay with a functional sensitivity of <=0.01 uIU/mL. Performed at [facility], [address] [place], [place] [address]   Hemoglobin A1c     Status: None   Collection Time: 03/20/23  6:55 AM  Result Value Ref Range   Hgb A1c  MFr Bld 5.2 4.8 - 5.6 %    Comment: (NOTE)         Prediabetes: 5.7 - 6.4         Diabetes: >6.4         Glycemic control for adults with diabetes: <7.0    Mean Plasma Glucose 103 mg/dL    Comment: (NOTE) Performed At: [facility] [address] [place], [place] [id number] Jolene Schimke MD WU:[id number]   Lipid panel     Status: None   Collection Time: 03/20/23  6:55 AM  Result Value Ref Range   Cholesterol 158 0 - 200 mg/dL   Triglycerides 76 <956 mg/dL   HDL 53 >21 mg/dL   Total CHOL/HDL Ratio 3.0 RATIO   VLDL 15 0 - 40 mg/dL   LDL Cholesterol 90 0 - 99 mg/dL    Comment:        Total Cholesterol/HDL:CHD Risk Coronary Heart Disease Risk Table                     Men   Women  1/2 Average Risk   3.4   3.3  Average Risk       5.0   4.4  2 X Average Risk   9.6   7.1  3 X Average Risk  23.4   11.0        Use the calculated Patient Ratio above and the CHD Risk Table to determine the patient's CHD Risk.        ATP III CLASSIFICATION (LDL):  <100     mg/dL   Optimal  308-657  mg/dL   Near or Above                    Optimal  130-159  mg/dL   Borderline  846-962  mg/dL   High  >952     mg/dL   Very High Performed at [facility], [address] [place], [place] [address]     Blood Alcohol level:  Lab Results  Component Value Date   [facility] <10 03/17/2023   ETH <10 12/31/2022    Metabolic Disorder Labs: Lab Results  Component Value Date   HGBA1C 5.2 03/20/2023   MPG 103 03/20/2023   MPG 103 10/19/2022   Lab Results  Component Value Date   PROLACTIN 48.1 (H) 11/10/2021   Lab Results  Component Value Date   CHOL 158 03/20/2023   TRIG 76 03/20/2023   HDL 53  03/20/2023   CHOLHDL 3.0 03/20/2023   VLDL 15 03/20/2023   LDLCALC 90 03/20/2023   LDLCALC 107 (H) 12/19/2022     Musculoskeletal: Strength & Muscle Tone: within normal limits Gait & Station: normal Patient leans: N/A  Psychiatric Specialty Exam: Physical Exam Vitals and nursing note reviewed.  Constitutional:      Appearance: Normal appearance.  HENT:     Head: Normocephalic.     Nose: Nose normal.  Pulmonary:     Effort: Pulmonary effort is normal.  Musculoskeletal:        General: Normal range of motion.     Cervical back: Normal range of motion.  Neurological:     General: No focal deficit present.     Mental Status: She is alert and oriented to person, place, and time.  Psychiatric:        Attention and Perception: Attention and perception normal.        Mood and Affect: Mood and affect normal.  Speech: Speech normal.        Behavior: Behavior normal. Behavior is cooperative.        Thought Content: Thought content normal.        Cognition and Memory: Cognition and memory normal.        Judgment: Judgment is impulsive.     Review of Systems  HENT:         Tooth pain r/t wisdom teeth extraction last week  All other systems reviewed and are negative.   Blood pressure (!) 97/49, pulse 62, temperature 97.8 F (36.6 C), resp. rate 19, height 5\' 2"  (1.575 m), weight 92.5 kg, last menstrual period 03/16/2023, SpO2 99%, not currently breastfeeding.Body mass index is 37.31 kg/m.  General Appearance: Casual  Eye Contact:  Good  Speech:  Normal Rate  Volume:  Normal  Mood:  euthymic  Affect:  Congruent  Thought Process:  Coherent  Orientation:  Full (Time, Place, and Person)  Thought Content:  Logical  Suicidal Thoughts:  No  Homicidal Thoughts:  No  Memory:  Immediate;   Fair Recent;   Fair Remote;   Fair  Judgement:  Fair  Insight:  Fair  Psychomotor Activity:  Normal  Concentration:  Concentration: Fair and Attention Span: Fair  Recall:  Fair  Fund  of Knowledge:  Good  Language:  Good  Akathisia:  No  Handed:  Right  AIMS (if indicated):     Assets:  Housing Leisure Time Physical Health Resilience Social Support  ADL's:  Intact  Cognition:  WNL  Sleep:         Physical Exam: Physical Exam Vitals and nursing note reviewed.  Constitutional:      Appearance: Normal appearance.  HENT:     Head: Normocephalic.     Nose: Nose normal.  Pulmonary:     Effort: Pulmonary effort is normal.  Musculoskeletal:        General: Normal range of motion.     Cervical back: Normal range of motion.  Neurological:     General: No focal deficit present.     Mental Status: She is alert and oriented to person, place, and time.  Psychiatric:        Attention and Perception: Attention and perception normal.        Mood and Affect: Mood and affect normal.        Speech: Speech normal.        Behavior: Behavior normal. Behavior is cooperative.        Thought Content: Thought content normal.        Cognition and Memory: Cognition and memory normal.        Judgment: Judgment is impulsive.    Review of Systems  HENT:         Tooth pain r/t wisdom teeth extraction last week  All other systems reviewed and are negative.  Blood pressure (!) 97/49, pulse 62, temperature 97.8 F (36.6 C), resp. rate 19, height 5\' 2"  (1.575 m), weight 92.5 kg, last menstrual period 03/16/2023, SpO2 99%, not currently breastfeeding. Body mass index is 37.31 kg/m.   Treatment Plan Summary: Daily contact with patient to assess and evaluate symptoms and progress in treatment, Medication management, and Plan : Major depressive disorder, recurrent, severe without psychosis: Cymbalta 60 mg daily   Anxiety: Gabapentin 300 mg TID   Insomnia: Trazodone 50 mg daily at bedtime PRN Seroquel 50 mg daily at bedtime:  EKG WDL, lipid panel WDL, TSH WDL, and A1C 5.2  Catha Nottingham  [place] Downs, NP 03/21/2023, 11:04 AM

## 2023-03-21 NOTE — Group Note (Signed)
Date:  03/21/2023 Time:  11:02 PM  Group Topic/Focus:  Wrap-Up Group:   The focus of this group is to help patients review their daily goal of treatment and discuss progress on daily workbooks.    Participation Level:  Active  Participation Quality:  Appropriate and Attentive  Affect:  Appropriate and Excited  Cognitive:  Alert and Appropriate  Insight: Appropriate  Engagement in Group:  Developing/Improving and Engaged  Modes of Intervention:  Clarification, Discussion, and Education  Additional Comments:     Lexus Shampine 03/21/2023, 11:02 PM

## 2023-03-21 NOTE — Group Note (Unsigned)
Date:  03/21/2023 Time:  10:59 PM  Group Topic/Focus:  Wrap-Up Group:   The focus of this group is to help patients review their daily goal of treatment and discuss progress on daily workbooks.     Participation Level:  {BHH PARTICIPATION ZOXWR:60454}  Participation Quality:  {BHH PARTICIPATION QUALITY:22265}  Affect:  {BHH AFFECT:22266}  Cognitive:  {BHH COGNITIVE:22267}  Insight: {BHH Insight2:20797}  Engagement in Group:  {BHH ENGAGEMENT IN UJWJX:91478}  Modes of Intervention:  {BHH MODES OF INTERVENTION:22269}  Additional Comments:  ***  Sharon Ward 03/21/2023, 10:59 PM

## 2023-03-21 NOTE — Group Note (Signed)
Date:  03/21/2023 Time:  6:14 AM  Group Topic/Focus:  Wrap-Up Group:   The focus of this group is to help patients review their daily goal of treatment and discuss progress on daily workbooks.    Participation Level:  Active  Participation Quality:  Appropriate and Attentive  Affect:  Appropriate  Cognitive:  Alert and Appropriate  Insight: Appropriate  Engagement in Group:  Engaged  Modes of Intervention:  Activity  Additional Comments:     Maglione,Lauralie Blacksher E 03/21/2023, 6:14 AM
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
## 2023-03-21 NOTE — Plan of Care (Signed)
D- Patient alert and oriented. Affect/mood. Denies SI, HI, AVH, and pain. Pt has been cooperative and attending group today Pt is taking scheduled medications A- Scheduled medications administered to patient, per MD orders. Support and encouragement provided.  Routine safety checks conducted every 15 minutes.  Patient informed to notify staff with problems or concerns. R- No adverse drug reactions noted. Patient contracts for safety at this time. Patient compliant with medications and treatment plan. Patient receptive, calm, and cooperative. Patient interacts well with others on the unit.  Patient remains safe at this time.

## 2023-03-21 NOTE — Group Note (Unsigned)
Date:  03/21/2023 Time:  10:53 PM  Group Topic/Focus:  Wrap-Up Group:   The focus of this group is to help patients review their daily goal of treatment and discuss progress on daily workbooks.     Participation Level:  {BHH PARTICIPATION ZOXWR:60454}  Participation Quality:  {BHH PARTICIPATION QUALITY:22265}  Affect:  {BHH AFFECT:22266}  Cognitive:  {BHH COGNITIVE:22267}  Insight: {BHH Insight2:20797}  Engagement in Group:  {BHH ENGAGEMENT IN UJWJX:91478}  Modes of Intervention:  {BHH MODES OF INTERVENTION:22269}  Additional Comments:  ***  Aasim Restivo 03/21/2023, 10:53 PM

## 2023-03-21 NOTE — Group Note (Signed)
Date:  03/21/2023 Time:  4:25 PM  Group Topic/Focus:  OUTDOOR RECREATION ACTIVITY    Participation Level:  Active  Participation Quality:  Appropriate  Affect:  Appropriate  Cognitive:  Alert, Appropriate, and Oriented  Insight: Appropriate  Engagement in Group:  Developing/Improving and Engaged  Modes of Intervention:  Activity, Rapport Building, and Socialization  Additional Comments:    Rosaura Carpenter 03/21/2023, 4:25 PM

## 2023-03-21 NOTE — Group Note (Unsigned)
Date:  03/21/2023 Time:  11:06 PM  Group Topic/Focus:  Wrap-Up Group:   The focus of this group is to help patients review their daily goal of treatment and discuss progress on daily workbooks.     Participation Level:  {BHH PARTICIPATION ZOXWR:60454}  Participation Quality:  {BHH PARTICIPATION QUALITY:22265}  Affect:  {BHH AFFECT:22266}  Cognitive:  {BHH COGNITIVE:22267}  Insight: {BHH Insight2:20797}  Engagement in Group:  {BHH ENGAGEMENT IN UJWJX:91478}  Modes of Intervention:  {BHH MODES OF INTERVENTION:22269}  Additional Comments:  ***  Marek Nghiem 03/21/2023, 11:06 PM

## 2023-03-21 NOTE — Group Note (Signed)
Date:  03/21/2023 Time:  10:10 AM  Group Topic/Focus:  Dimensions of Wellness:   The focus of this group is to introduce the topic of wellness and discuss the role each dimension of wellness plays in total health.    Participation Level:  Active  Participation Quality:  Appropriate and Attentive  Affect:  Appropriate  Cognitive:  Alert, Appropriate, and Oriented  Insight: Appropriate  Engagement in Group:  Developing/Improving and Supportive  Modes of Intervention:  Activity, Discussion, and Socialization  Additional Comments:    Litsy Epting 03/21/2023, 10:10 AM

## 2023-03-21 NOTE — Progress Notes (Signed)
Nursing Shift Note:  1900-0700  Attended Evening Group: yes Medication Compliant: yes  Behavior: sad, but visible and social Sleep Quality: good Significant Changes: none  Sharon Ward voiced concern about her blood pressure reading and stated that she was anxious about a pending DSS meeting (child custody).  She was cooperative and denied thoughts of suicide.  Patient requested that morning vitals be taken after breakfast.  No unsafe behaviors observed.

## 2023-03-21 NOTE — Group Note (Signed)
Recreation Therapy Group Note   Group Topic:General Recreation  Group Date: 03/21/2023 Start Time: 1015 End Time: 1120 Facilitators: Rosina Lowenstein, LRT, CTRS Location: Courtyard  Group Description: Outdoor Recreation. Patients had the option to play basketball, play with a deck of cards or sit and listen to music while outside in the courtyard getting fresh air and sunlight. LRT and pts discussed things that they enjoy doing in their free time outside of the hospital.   Goal Area(s) Addressed: Patient will identify leisure interests.  Patient will practice healthy decision making. Patient will engage in recreation activity.   Affect/Mood: Appropriate   Participation Level: Moderate   Participation Quality: Independent   Behavior: Calm and Cooperative   Speech/Thought Process: Coherent   Insight: Good   Judgement: Good   Modes of Intervention: Activity   Patient Response to Interventions:  Attentive and Receptive   Education Outcome:  In group clarification offered    Clinical Observations/Individualized Feedback: Sharon Ward was mostly active in their participation of session activities and group discussion. Pt was present in group briefly before going inside. Pt shared that she was "having hot flashes" and needed to go in. Pt went inside and never came back out. However, pt interacted well with LRT and peers while outside in group.   Plan: Continue to engage patient in RT group sessions 2-3x/week.   Rosina Lowenstein, LRT, CTRS 03/21/2023 11:57 AM
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
## 2023-03-21 NOTE — Group Note (Unsigned)
Date:  03/21/2023 Time:  11:08 PM  Group Topic/Focus:  Wrap-Up Group:   The focus of this group is to help patients review their daily goal of treatment and discuss progress on daily workbooks.     Participation Level:  {BHH PARTICIPATION XBMWU:13244}  Participation Quality:  {BHH PARTICIPATION QUALITY:22265}  Affect:  {BHH AFFECT:22266}  Cognitive:  {BHH COGNITIVE:22267}  Insight: {BHH Insight2:20797}  Engagement in Group:  {BHH ENGAGEMENT IN WNUUV:25366}  Modes of Intervention:  {BHH MODES OF INTERVENTION:22269}  Additional Comments:  ***  Treysean Petruzzi 03/21/2023, 11:08 PM

## 2023-03-22 DIAGNOSIS — F332 Major depressive disorder, recurrent severe without psychotic features: Secondary | ICD-10-CM | POA: Diagnosis not present
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
## 2023-03-22 NOTE — Plan of Care (Signed)
  Problem: Education: Goal: Knowledge of General Education information will improve Description: Including pain rating scale, medication(s)/side effects and non-pharmacologic comfort measures Outcome: Adequate for Discharge   Problem: Health Behavior/Discharge Planning: Goal: Ability to manage health-related needs will improve Outcome: Adequate for Discharge   Problem: Clinical Measurements: Goal: Ability to maintain clinical measurements within normal limits will improve Outcome: Adequate for Discharge Goal: Will remain free from infection Outcome: Adequate for Discharge Goal: Diagnostic test results will improve Outcome: Adequate for Discharge Goal: Respiratory complications will improve Outcome: Adequate for Discharge Goal: Cardiovascular complication will be avoided Outcome: Adequate for Discharge   Problem: Activity: Goal: Risk for activity intolerance will decrease Outcome: Adequate for Discharge   Problem: Nutrition: Goal: Adequate nutrition will be maintained Outcome: Adequate for Discharge   Problem: Coping: Goal: Level of anxiety will decrease Outcome: Adequate for Discharge   Problem: Elimination: Goal: Will not experience complications related to bowel motility Outcome: Adequate for Discharge Goal: Will not experience complications related to urinary retention Outcome: Adequate for Discharge   Problem: Pain Managment: Goal: General experience of comfort will improve Outcome: Adequate for Discharge   Problem: Safety: Goal: Ability to remain free from injury will improve Outcome: Adequate for Discharge   Problem: Skin Integrity: Goal: Risk for impaired skin integrity will decrease Outcome: Adequate for Discharge   Problem: Education: Goal: Knowledge of [place] [facility] Education information/materials will improve Outcome: Adequate for Discharge Goal: Emotional status will improve Outcome: Adequate for Discharge Goal: Mental status will  improve Outcome: Adequate for Discharge Goal: Verbalization of understanding the information provided will improve Outcome: Adequate for Discharge   Problem: Activity: Goal: Interest or engagement in activities will improve Outcome: Adequate for Discharge Goal: Sleeping patterns will improve Outcome: Adequate for Discharge   Problem: Coping: Goal: Ability to verbalize frustrations and anger appropriately will improve Outcome: Adequate for Discharge Goal: Ability to demonstrate self-control will improve Outcome: Adequate for Discharge   Problem: Health Behavior/Discharge Planning: Goal: Identification of resources available to assist in meeting health care needs will improve Outcome: Adequate for Discharge Goal: Compliance with treatment plan for underlying cause of condition will improve Outcome: Adequate for Discharge   Problem: Physical Regulation: Goal: Ability to maintain clinical measurements within normal limits will improve Outcome: Adequate for Discharge   Problem: Safety: Goal: Periods of time without injury will increase Outcome: Adequate for Discharge   
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
## 2023-03-22 NOTE — Progress Notes (Signed)
  [facility] Adult Case Management Discharge Plan :  Will you be returning to the same living situation after discharge:  Yes,  pt plans to return home. At discharge, do you have transportation home?: Yes,  pt given taxi voucher. Do you have the ability to pay for your medications: Yes,  [employer] Plan.   Release of information consent forms completed and in the chart;  Patient's signature needed at discharge.  Patient to Follow up at:  Follow-up Information     [facility].   Why: Per your request, ACT team was notified of your discharge and are expecting your call. Thanks! Contact information: [address]  [address] [place] [address] [phone]                 Next level of care provider has access to [facility] Link:no  Safety Planning and Suicide Prevention discussed: Yes,  SPE completed with pt.     Has patient been referred to the Quitline?: Patient refused referral for treatment  Patient has been referred for addiction treatment: Patient refused referral for treatment.  Glenis Smoker, LCSW 03/22/2023, 2:18 PM

## 2023-03-22 NOTE — Group Note (Signed)
Date:  03/22/2023 Time:  10:11 AM  Group Topic/Focus:  Goals Group:   The focus of this group is to help patients establish daily goals to achieve during treatment and discuss how the patient can incorporate goal setting into their daily lives to aide in recovery.    Participation Level:  Active  Participation Quality:  Appropriate  Affect:  Appropriate  Cognitive:  Appropriate  Insight: Appropriate  Engagement in Group:  Engaged  Modes of Intervention:  Discussion, Education, and Support  Additional Comments:    Sharon Ward 03/22/2023, 10:11 AM

## 2023-03-22 NOTE — BHH Suicide Risk Assessment (Deleted)
[facility] Admission Suicide Risk Assessment   Nursing information obtained from:  Patient Demographic factors:  Living alone, Unemployed Current Mental Status:  NA Loss Factors:  NA Historical Factors:  Prior suicide attempts Risk Reduction Factors:  Positive social support  Total Time spent with patient: 45 minutes Principal Problem: Major depressive disorder, recurrent severe without psychotic features (HCC) Diagnosis:  Principal Problem:   Major depressive disorder, recurrent severe without psychotic features (HCC)  Subjective Data:  27 yo female admitted after becoming upset with her ex and overdosing impulsively, history of depression and anxiety.  The client stabilized quickly with rest and distance from her ex.  Medications continued and group therapy started.  She denies depression, anxiety, suicidal/homicidal ideations, hallucinations, and substance abuse.  Rital has met maximum benefit of hospitalization.  Discharge instructions provided with Rx, crisis numbers, and follow up with her [facility] ACT team.  Continued Clinical Symptoms:    The "Alcohol Use Disorders Identification Test", Guidelines for Use in Primary Care, Second Edition.  World Science writer [facility]). Score between 0-7:  no or low risk or alcohol related problems. Score between 8-15:  moderate risk of alcohol related problems. Score between 16-19:  high risk of alcohol related problems. Score 20 or above:  warrants further diagnostic evaluation for alcohol dependence and treatment.   CLINICAL FACTORS:   Depression on admission   Musculoskeletal: Strength & Muscle Tone: within normal limits Gait & Station: normal Patient leans: N/A  Psychiatric Specialty Exam: Physical Exam Vitals and nursing note reviewed.  Constitutional:      Appearance: Normal appearance.  HENT:     Head: Normocephalic.     Nose: Nose normal.  Pulmonary:     Effort: Pulmonary effort is normal.  Musculoskeletal:        General:  Normal range of motion.     Cervical back: Normal range of motion.  Neurological:     General: No focal deficit present.     Mental Status: She is alert and oriented to person, place, and time.  Psychiatric:        Attention and Perception: Attention and perception normal.        Mood and Affect: Mood and affect normal.        Speech: Speech normal.        Behavior: Behavior normal. Behavior is cooperative.        Thought Content: Thought content normal.        Cognition and Memory: Cognition and memory normal.        Judgment: Judgment normal.     Review of Systems  All other systems reviewed and are negative.   Blood pressure (!) 94/54, pulse (!) 50, temperature (!) 97.5 F (36.4 C), resp. rate 19, height 5\' 2"  (1.575 m), weight 92.5 kg, last menstrual period 03/16/2023, SpO2 97%, not currently breastfeeding.Body mass index is 37.31 kg/m.  General Appearance: Casual  Eye Contact:  Good  Speech:  Normal Rate  Volume:  Normal  Mood:  Euthymic  Affect:  Congruent  Thought Process:  Coherent and Descriptions of Associations: Intact  Orientation:  Full (Time, Place, and Person)  Thought Content:  WDL and Logical  Suicidal Thoughts:  No  Homicidal Thoughts:  No  Memory:  Immediate;   Good Recent;   Good Remote;   Good  Judgement:  Fair  Insight:  Fair  Psychomotor Activity:  Normal  Concentration:  Concentration: Good and Attention Span: Good  Recall:  Good  Fund of Knowledge:  Good  Language:  Good  Akathisia:  No  Handed:  Right  AIMS (if indicated):     Assets:  Housing Leisure Time Physical Health Resilience Social Support  ADL's:  Intact  Cognition:  WNL  Sleep:         Physical Exam: Physical Exam Vitals and nursing note reviewed.  Constitutional:      Appearance: Normal appearance.  HENT:     Head: Normocephalic.     Nose: Nose normal.  Pulmonary:     Effort: Pulmonary effort is normal.  Musculoskeletal:        General: Normal range of motion.      Cervical back: Normal range of motion.  Neurological:     General: No focal deficit present.     Mental Status: She is alert and oriented to person, place, and time.  Psychiatric:        Attention and Perception: Attention and perception normal.        Mood and Affect: Mood and affect normal.        Speech: Speech normal.        Behavior: Behavior normal. Behavior is cooperative.        Thought Content: Thought content normal.        Cognition and Memory: Cognition and memory normal.        Judgment: Judgment normal.    Review of Systems  All other systems reviewed and are negative.  Blood pressure (!) 94/54, pulse (!) 50, temperature (!) 97.5 F (36.4 C), resp. rate 19, height 5\' 2"  (1.575 m), weight 92.5 kg, last menstrual period 03/16/2023, SpO2 97%, not currently breastfeeding. Body mass index is 37.31 kg/m.   COGNITIVE FEATURES THAT CONTRIBUTE TO RISK:  None    SUICIDE RISK:   Minimal: No identifiable suicidal ideation.  Patients presenting with no risk factors but with morbid ruminations; may be classified as minimal risk based on the severity of the depressive symptoms  PLAN OF CARE:  Daily contact with patient to assess and evaluate symptoms and progress in treatment, Medication management, and Plan : Major depressive disorder, recurrent, severe without psychosis: Cymbalta 60 mg daily   Anxiety: Gabapentin 300 mg TID   Insomnia: Trazodone 50 mg daily at bedtime PRN Seroquel 50 mg daily at bedtime:  EKG WDL, lipid panel WDL, TSH WDL, and A1C 5.2  I certify that inpatient services furnished can reasonably be expected to improve the patient's condition.   Nanine Means, NP 03/22/2023, 9:42 AM

## 2023-03-22 NOTE — Progress Notes (Signed)
Discharge Note:  Patient denies SI/HI/AVH at this time. Discharge instructions, AVS, prescriptions, and transition record gone over with patient. Patient agrees to comply with medication management, follow-up visit, and outpatient therapy. Patient belongings returned to patient. Patient questions and concerns addressed and answered. Patient ambulatory off unit. Patient discharged to home via cab.

## 2023-03-22 NOTE — Progress Notes (Signed)
Nursing Shift Note:  1900-0700  Attended Evening Group: yes Medication Compliant: yes  Behavior: cooperative and social Sleep Quality: good Significant Changes: none  The patient displayed mild anxiety.  She was pleasant and denied thoughts of harming herself.  Tyronda requested her treatment team provide transport back to [place] once she is discharged.

## 2023-03-24 ENCOUNTER — Emergency Department (HOSPITAL_COMMUNITY): Payer: MEDICAID

## 2023-03-24 ENCOUNTER — Emergency Department (HOSPITAL_COMMUNITY)
Admission: EM | Admit: 2023-03-24 | Discharge: 2023-03-27 | Disposition: A | Discharge status: Other Acute Inpatient Hospital | Payer: MEDICAID | Attending: Emergency Medicine | Admitting: Emergency Medicine

## 2023-03-24 ENCOUNTER — Other Ambulatory Visit: Payer: Self-pay

## 2023-03-24 DIAGNOSIS — F332 Major depressive disorder, recurrent severe without psychotic features: Secondary | ICD-10-CM | POA: Diagnosis not present

## 2023-03-24 DIAGNOSIS — S0990XA Unspecified injury of head, initial encounter: Secondary | ICD-10-CM | POA: Insufficient documentation

## 2023-03-24 DIAGNOSIS — F603 Borderline personality disorder: Secondary | ICD-10-CM | POA: Diagnosis not present

## 2023-03-24 DIAGNOSIS — T1491XA Suicide attempt, initial encounter: Secondary | ICD-10-CM

## 2023-03-24 DIAGNOSIS — T71162A Asphyxiation due to hanging, intentional self-harm, initial encounter: Secondary | ICD-10-CM | POA: Diagnosis present

## 2023-03-24 DIAGNOSIS — X838XXA Intentional self-harm by other specified means, initial encounter: Secondary | ICD-10-CM | POA: Insufficient documentation

## 2023-03-24 DIAGNOSIS — J45909 Unspecified asthma, uncomplicated: Secondary | ICD-10-CM | POA: Diagnosis not present

## 2023-03-24 DIAGNOSIS — Z9149 Other personal history of psychological trauma, not elsewhere classified: Secondary | ICD-10-CM | POA: Diagnosis not present

## 2023-03-24 DIAGNOSIS — Z9151 Personal history of suicidal behavior: Secondary | ICD-10-CM | POA: Diagnosis present

## 2023-03-24 LAB — CBC WITH DIFFERENTIAL/PLATELET
Abs Immature Granulocytes: 0.01 10*3/uL (ref 0.00–0.07)
Basophils Absolute: 0 10*3/uL (ref 0.0–0.1)
Basophils Relative: 1 %
Eosinophils Absolute: 0 10*3/uL (ref 0.0–0.5)
Eosinophils Relative: 1 %
HCT: 40.8 % (ref 36.0–46.0)
Hemoglobin: 13.5 g/dL (ref 12.0–15.0)
Immature Granulocytes: 0 %
Lymphocytes Relative: 23 %
Lymphs Abs: 1.9 10*3/uL (ref 0.7–4.0)
MCH: 28.5 pg (ref 26.0–34.0)
MCHC: 33.1 g/dL (ref 30.0–36.0)
MCV: 86.3 fL (ref 80.0–100.0)
Monocytes Absolute: 0.6 10*3/uL (ref 0.1–1.0)
Monocytes Relative: 7 %
Neutro Abs: 5.7 10*3/uL (ref 1.7–7.7)
Neutrophils Relative %: 68 %
Platelets: 351 10*3/uL (ref 150–400)
RBC: 4.73 MIL/uL (ref 3.87–5.11)
RDW: 13.2 % (ref 11.5–15.5)
WBC: 8.2 10*3/uL (ref 4.0–10.5)
nRBC: 0 % (ref 0.0–0.2)

## 2023-03-24 LAB — COMPREHENSIVE METABOLIC PANEL
ALT: 19 U/L (ref 0–44)
AST: 21 U/L (ref 15–41)
Albumin: 4.6 g/dL (ref 3.5–5.0)
Alkaline Phosphatase: 56 U/L (ref 38–126)
Anion gap: 18 — ABNORMAL HIGH (ref 5–15)
BUN: 19 mg/dL (ref 6–20)
CO2: 18 mmol/L — ABNORMAL LOW (ref 22–32)
Calcium: 10.5 mg/dL — ABNORMAL HIGH (ref 8.9–10.3)
Chloride: 105 mmol/L (ref 98–111)
Creatinine, Ser: 1 mg/dL (ref 0.44–1.00)
GFR, Estimated: >60 mL/min (ref >60)
Glucose, Bld: 86 mg/dL (ref 70–99)
Potassium: 3.4 mmol/L — ABNORMAL LOW (ref 3.5–5.1)
Sodium: 141 mmol/L (ref 135–145)
Total Bilirubin: 0.7 mg/dL (ref 0.3–1.2)
Total Protein: 7.8 g/dL (ref 6.5–8.1)

## 2023-03-24 LAB — ACETAMINOPHEN LEVEL
Acetaminophen (Tylenol), Serum: <10 ug/mL — ABNORMAL LOW (ref 10–30)
Acetaminophen (Tylenol), Serum: <10 ug/mL — ABNORMAL LOW (ref 10–30)

## 2023-03-24 LAB — ETHANOL: Alcohol, Ethyl (B): <10 mg/dL (ref <10)

## 2023-03-24 LAB — HCG, SERUM, QUALITATIVE: Preg, Serum: NEGATIVE

## 2023-03-24 LAB — SALICYLATE LEVEL: Salicylate Lvl: <7 mg/dL — ABNORMAL LOW (ref 7.0–30.0)

## 2023-03-24 MED ORDER — ZIPRASIDONE MESYLATE 20 MG IM SOLR
20.0000 mg | INTRAMUSCULAR | Status: DC | PRN
  Filled 2023-03-24 (×2): qty 20

## 2023-03-24 MED ORDER — RISPERIDONE 0.5 MG PO TBDP
2.0000 mg | ORAL_TABLET | Freq: Three times a day (TID) | ORAL | Status: DC | PRN
  Administered 2023-03-25 – 2023-03-26 (×2): 2 mg via ORAL
  Filled 2023-03-24 (×2): qty 4

## 2023-03-24 MED ORDER — LORAZEPAM 1 MG PO TABS
1.0000 mg | ORAL_TABLET | ORAL | Status: AC | PRN
  Administered 2023-03-25: 1 mg via ORAL
  Filled 2023-03-24: qty 1

## 2023-03-24 MED ORDER — TRAZODONE HCL 50 MG PO TABS
50.0000 mg | ORAL_TABLET | Freq: Every evening | ORAL | Status: DC | PRN
  Administered 2023-03-25 – 2023-03-26 (×2): 50 mg via ORAL
  Filled 2023-03-24 (×2): qty 1

## 2023-03-24 MED ORDER — DULOXETINE HCL 30 MG PO CPEP
60.0000 mg | ORAL_CAPSULE | Freq: Every day | ORAL | Status: DC
  Administered 2023-03-25 – 2023-03-27 (×3): 60 mg via ORAL
  Filled 2023-03-24 (×3): qty 2

## 2023-03-24 MED ORDER — IOHEXOL 350 MG/ML SOLN
75.0000 mL | Freq: Once | INTRAVENOUS | Status: AC | PRN
  Administered 2023-03-24: 75 mL via INTRAVENOUS

## 2023-03-24 MED ORDER — GABAPENTIN 300 MG PO CAPS
300.0000 mg | ORAL_CAPSULE | Freq: Three times a day (TID) | ORAL | Status: DC
  Administered 2023-03-25 – 2023-03-27 (×7): 300 mg via ORAL
  Filled 2023-03-24 (×7): qty 1

## 2023-03-24 MED ORDER — LACTATED RINGERS IV BOLUS
1000.0000 mL | Freq: Once | INTRAVENOUS | Status: AC
  Administered 2023-03-24: 1000 mL via INTRAVENOUS

## 2023-03-24 MED ORDER — NALOXONE HCL 0.4 MG/ML IJ SOLN
0.4000 mg | Freq: Once | INTRAMUSCULAR | Status: DC
  Filled 2023-03-24: qty 1

## 2023-03-24 NOTE — BH Assessment (Addendum)
Clinician contacted [facility] and spoke with coordinator Lauren.  She will contact us in the secure messaging regarding when they can see this patient.

## 2023-03-24 NOTE — ED Notes (Signed)
Pt has received dinner and is currently eating.

## 2023-03-24 NOTE — Consult Note (Signed)
Sharon Ward  [name] MRN: [id number] DOB: [birth year] DATE OF Consult: 03/24/2023  PRIMARY PSYCHIATRIC DIAGNOSES  1.  Borderline personality disorder 2.  MDD, recurrent, severe, without psychotic features 3.  PTSD per history    RECOMMENDATIONS  Recommendations: Medication recommendations: -Continue 60mg  po daily for mood; -Continue gabapentin 300mg  po TID for anxiety; Continue trazodone 50mg  po nightly PRN insomnia Non-Medication/therapeutic recommendations: Psychiatric hospitalization Is inpatient psychiatric hospitalization recommended for this patient? Yes (Explain why): Patient attempted suicide and has ongoing suicidal ideation  Follow-Up Telepsychiatry C/L services: We will sign off for now. Please re-consult our service if needed for any concerning changes in the patient's condition, discharge planning, or questions. Communication: Treatment team members (and family members if applicable) who were involved in treatment/care discussions and planning, and with whom we spoke or engaged with via secure text/chat, include the following: Dr. Dalene Seltzer; Juliette Alcide  Thank you for involving [place] in the care of this patient. If you have any additional questions or concerns, please call [phone] and ask for me or the provider on-call.  TELEPSYCHIATRY ATTESTATION & CONSENT  As the provider for this telehealth consult, I attest that I verified the patient's identity using two separate identifiers, introduced myself to the patient, provided my credentials, disclosed my location, and performed this encounter via a HIPAA-compliant, real-time, face-to-face, two-way, interactive audio and video platform and with the full consent and agreement of the patient (or guardian as applicable.)  Patient physical location: ED in [facility]  Telehealth provider physical location: home office in state of [place]   Video start time: 2210 EST Video end time: 2218  EST   IDENTIFYING DATA  Sharon Ward is a 27 y.o. year-old female for whom a psychiatric consultation has been ordered by the primary provider. The patient was identified using two separate identifiers.  CHIEF COMPLAINT/REASON FOR CONSULT  Suicide attempt  HISTORY OF PRESENT ILLNESS (HPI)  Sharon Ward is a 27 year old female with a history of borderline personality disorder, depression, rule out bipolar II disorder, PTSD who presents to the ED after a suicide attempt by strangulation. Psychiatry consulted for disposition recommendations.   Per chart review, "Pt BIB EMS from home Pt was found with cord wrapped around her neck per ems pt was hanging for 45 sec before they were able to bring her down to the floor."  On evaluation, patient noted to be poorly cooperative, significantly dysregulated, irritable, loud, tearful, psychomotor agitated, externalizing blame, linear. Patient states she in the ED because, "I have an ACT team that is refusing to give me my medication for 3 days and they refuse to do therapy!" Patient states, "I had a manic episode today, I snapped so I tried to kill myself! I have been begging these people to give me my medication for 3 days!" Patient declines to discuss what happened today other than to say, "A lot of other bad things happened, but then they woke me up to talk to you and now I can't sleep!" and begins yelling about not wanting to be in the hospital.  And then stating, "You woke me up! This is your fault!" And then went on to say, "Of course I want to kill myself! Nobody will help me!" Patient endorses depressed mood, insomnia, anhedonia, fatigue, poor concentration, hopelessness, worthlessness. Endorses ongoing active suicidal ideation with intent, declines to discuss the plan she has. Denies symptoms consistent with mania/hypomania, paranoia, auditory and visual hallucinations, homicidal ideation. Patient unable to engage in  safety planning at the time of  evaluation.    PAST PSYCHIATRIC HISTORY  Prior psych meds: Abilify, Seroquel  Outpatient: Inpatient: Multiple prior, discharged 03/22/23 Non-suicidal self injury: Suicide attempts: Multiple prior suicide attempts by overdose and hanging  Violence: Denies  Drugs/alcohol: Otherwise as per HPI above.  PAST MEDICAL HISTORY  Past Medical History:  Diagnosis Date   Asthma    Bipolar 1 disorder, mixed (HCC)    Depression    Generalized anxiety disorder    Intentional drug overdose (HCC) 11/03/2020   Low-lying placenta 01/07/2022   Resolved 03/04/22   Relationship dysfunction    Suicide attempt (HCC) 11/02/2020     HOME MEDICATIONS  Facility Ordered Medications  Medication   [COMPLETED] lactated ringers bolus 1,000 mL   naloxone (NARCAN) injection 0.4 mg   [COMPLETED] iohexol (OMNIPAQUE) 350 MG/ML injection 75 mL   PTA Medications  Medication Sig   DULoxetine (CYMBALTA) 60 MG capsule Take 1 capsule (60 mg total) by mouth daily.   traZODone (DESYREL) 50 MG tablet Take 1 tablet (50 mg total) by mouth at bedtime as needed for sleep.   gabapentin (NEURONTIN) 100 MG capsule Take 3 capsules (300 mg total) by mouth 3 (three) times daily.     ALLERGIES  Allergies  Allergen Reactions   Ascorbate Rash   Citrus Rash   Coconut Flavor Rash   Lamotrigine Rash   Orange (Diagnostic) Rash   Peach Flavor Rash   Pear Rash   Pineapple Rash    SOCIAL & SUBSTANCE USE HISTORY  Social History   Socioeconomic History   Marital status: Single    Spouse name: Not on file   Number of children: 1   Years of education: 10   Highest education level: 10th grade  Occupational History   Not on file  Tobacco Use   Smoking status: Former    Current packs/day: 0.00    Average packs/day: 1 pack/day for 15.0 years (15.0 ttl pk-yrs)    Types: Cigarettes    Start date: 07/14/2006    Quit date: 07/14/2021    Years since quitting: 1.6   Smokeless tobacco: Never   Tobacco comments:    only smoke a  "couple" of cigarettes when stressed or anxious, socially with friends per [facility] chart  Vaping Use   Vaping status: Former   Start date: 08/10/2003   Quit date: 05/04/2021  Substance and Sexual Activity   Alcohol use: Not Currently    Comment: occasional prior to pregnancy   Drug use: Not Currently    Frequency: 4.0 times per week    Types: Marijuana    Comment: No Delta 9 since February 2023   Sexual activity: Not Currently    Partners: Male    Birth control/protection: None  Other Topics Concern   Not on file  Social History Narrative   Not on file   Social Determinants of Health   Financial Resource Strain: Not on file  Food Insecurity: No Food Insecurity (03/18/2023)   Hunger Vital Sign    Worried About Running Out of Food in the Last Year: Never true    Ran Out of Food in the Last Year: Never true  Transportation Needs: No Transportation Needs (03/18/2023)   PRAPARE - Administrator, Civil Service (Medical): No    Lack of Transportation (Non-Medical): No  Physical Activity: Not on file  Stress: Not on file  Social Connections: Not on file   Social History   Tobacco Use  Smoking  Status Former   Current packs/day: 0.00   Average packs/day: 1 pack/day for 15.0 years (15.0 ttl pk-yrs)   Types: Cigarettes   Start date: 07/14/2006   Quit date: 07/14/2021   Years since quitting: 1.6  Smokeless Tobacco Never  Tobacco Comments   only smoke a "couple" of cigarettes when stressed or anxious, socially with friends per [facility] chart   Social History   Substance and Sexual Activity  Alcohol Use Not Currently   Comment: occasional prior to pregnancy   Social History   Substance and Sexual Activity  Drug Use Not Currently   Frequency: 4.0 times per week   Types: Marijuana   Comment: No Delta 9 since February 2023     FAMILY HISTORY  Family History  Problem Relation Age of Onset   Asthma Mother    Diabetes Mother    Healthy Mother    Hypertension Father     Asthma Father    Diabetes Father    Healthy Father    Asthma Brother    Hypertension Paternal Uncle    Diabetes Paternal Grandmother    Stroke Paternal Grandfather    Heart disease Paternal Grandfather    Hypertension Paternal Grandfather    Diabetes Paternal Grandfather      MENTAL STATUS EXAM (MSE)  Presentation  General Appearance:  Appropriate for Environment  Eye Contact: Good  Speech: Normal Rate  Speech Volume: Loud   Mood and Affect  Mood: Irritable   Affect: Labile    Thought Process  Thought Processes: Coherent  Descriptions of Associations: Intact  Orientation: Full (Time, Place and Person)  Thought Content: Abstract Reasoning  History of Schizophrenia/Schizoaffective disorder: No  Duration of Psychotic Symptoms: N/A  Hallucinations: Hallucinations: None  Ideas of Reference: None  Suicidal Thoughts: Suicidal Thoughts: Yes, Active with intent and plan that she will not disclose  Homicidal Thoughts: Homicidal Thoughts: No   Sensorium  Memory: Immediate Good; Recent Good; Remote Fair  Judgment: Poor  Insight: Poor   Executive Functions  Concentration: Fair  Attention Span: Fair  Recall: Good  Fund of Knowledge: Fair  Language: Good   Psychomotor Activity  Psychomotor Activity: Psychomotor agitated   Assets  Assets: Communication     VITALS  Blood pressure 109/61, pulse (!) 59, temperature 97.9 F (36.6 C), temperature source Oral, resp. rate 15, height 5\' 2"  (1.575 m), weight 95.3 kg, last menstrual period 03/16/2023, SpO2 94%, not currently breastfeeding.  LABS  Admission on 03/24/2023  Component Date Value Ref Range Status   Sodium 03/24/2023 141  135 - 145 mmol/L Final   Potassium 03/24/2023 3.4 (L)  3.5 - 5.1 mmol/L Final   Chloride 03/24/2023 105  98 - 111 mmol/L Final   CO2 03/24/2023 18 (L)  22 - 32 mmol/L Final   Glucose, Bld 03/24/2023 86  70 - 99 mg/dL Final   Glucose reference range  applies only to samples taken after fasting for at least 8 hours.   BUN 03/24/2023 19  6 - 20 mg/dL Final   Creatinine, Ser 03/24/2023 1.00  0.44 - 1.00 mg/dL Final   Calcium 09/81/1914 10.5 (H)  8.9 - 10.3 mg/dL Final   Total Protein 78/29/5621 7.8  6.5 - 8.1 g/dL Final   Albumin 30/86/5784 4.6  3.5 - 5.0 g/dL Final   AST 69/62/9528 21  15 - 41 U/L Final   ALT 03/24/2023 19  0 - 44 U/L Final   Alkaline Phosphatase 03/24/2023 56  38 - 126 U/L Final  Total Bilirubin 03/24/2023 0.7  0.3 - 1.2 mg/dL Final   GFR, Estimated 03/24/2023 >60  >60 mL/min Final   Comment: (Ward) Calculated using the CKD-EPI Creatinine Equation (2021)    Anion gap 03/24/2023 18 (H)  5 - 15 Final   Performed at [facility], [address], [place], [place] [address]   Alcohol, Ethyl (B) 03/24/2023 <10  <10 mg/dL Final   Comment: (Ward) Lowest detectable limit for serum alcohol is 10 mg/dL.  For medical purposes only. Performed at [facility], [address] [place], [place] [address]    WBC 03/24/2023 8.2  4.0 - 10.5 K/uL Final   RBC 03/24/2023 4.73  3.87 - 5.11 MIL/uL Final   Hemoglobin 03/24/2023 13.5  12.0 - 15.0 g/dL Final   HCT 09/81/1914 40.8  36.0 - 46.0 % Final   MCV 03/24/2023 86.3  80.0 - 100.0 fL Final   MCH 03/24/2023 28.5  26.0 - 34.0 pg Final   MCHC 03/24/2023 33.1  30.0 - 36.0 g/dL Final   RDW 78/29/5621 13.2  11.5 - 15.5 % Final   Platelets 03/24/2023 351  150 - 400 K/uL Final   nRBC 03/24/2023 0.0  0.0 - 0.2 % Final   Neutrophils Relative % 03/24/2023 68  % Final   Neutro Abs 03/24/2023 5.7  1.7 - 7.7 K/uL Final   Lymphocytes Relative 03/24/2023 23  % Final   Lymphs Abs 03/24/2023 1.9  0.7 - 4.0 K/uL Final   Monocytes Relative 03/24/2023 7  % Final   Monocytes Absolute 03/24/2023 0.6  0.1 - 1.0 K/uL Final   Eosinophils Relative 03/24/2023 1  % Final   Eosinophils Absolute 03/24/2023 0.0  0.0 - 0.5 K/uL Final   Basophils Relative 03/24/2023 1  %  Final   Basophils Absolute 03/24/2023 0.0  0.0 - 0.1 K/uL Final   Immature Granulocytes 03/24/2023 0  % Final   Abs Immature Granulocytes 03/24/2023 0.01  0.00 - 0.07 K/uL Final   Performed at [facility], [address], [place], [place] [address]   Preg, Serum 03/24/2023 NEGATIVE  NEGATIVE Final   Comment:        THE SENSITIVITY OF THIS METHODOLOGY IS >10 mIU/mL. Performed at [facility], [address], [place], [place] [address]    Salicylate Lvl 03/24/2023 <7.0 (L)  7.0 - 30.0 mg/dL Final   Performed at [facility], [address] [place], [place] [address]   Acetaminophen (Tylenol), Serum 03/24/2023 <10 (L)  10 - 30 ug/mL Final   Comment: (Ward) Therapeutic concentrations vary significantly. A range of 10-30 ug/mL  may be an effective concentration for many patients. However, some  are best treated at concentrations outside of this range. Acetaminophen concentrations >150 ug/mL at 4 hours after ingestion  and >50 ug/mL at 12 hours after ingestion are often associated with  toxic reactions.  Performed at [facility], [address] [place], [place] [address]    Acetaminophen (Tylenol), Serum 03/24/2023 <10 (L)  10 - 30 ug/mL Final   Comment: (Ward) Therapeutic concentrations vary significantly. A range of 10-30 ug/mL  may be an effective concentration for many patients. However, some  are best treated at concentrations outside of this range. Acetaminophen concentrations >150 ug/mL at 4 hours after ingestion  and >50 ug/mL at 12 hours after ingestion are often associated with  toxic reactions.  Performed at [facility], [address] [place], [place] [address]  PSYCHIATRIC REVIEW OF SYSTEMS (ROS)  ROS: Notable for the following relevant positive findings: Review of Systems  Psychiatric/Behavioral:  Positive for depression and suicidal ideas. The patient has insomnia.      Additional findings:      Musculoskeletal: No abnormal movements observed      Gait & Station: Normal      Pain Screening: Denies      Nutrition & Dental Concerns: n/a  RISK FORMULATION/ASSESSMENT  Is the patient experiencing any suicidal or homicidal ideations: Yes       Explain if yes: Attempted suicide prior to ED presentation, endorses suicidal ideation with intent and plan that she declines to disclose Protective factors considered for safety management: Outpatient team  Risk factors/concerns considered for safety management:  Prior attempt Depression Hopelessness Impulsivity Unmarried  Is there a safety management plan with the patient and treatment team to minimize risk factors and promote protective factors: Yes           Explain: Psychiatric hospitalization  Is crisis care placement or psychiatric hospitalization recommended: Yes     Based on my current evaluation and risk assessment, patient is determined at this time to be at:  High risk  *RISK ASSESSMENT Risk assessment is a dynamic process; it is possible that this patient's condition, and risk level, may change. This should be re-evaluated and managed over time as appropriate. Please re-consult psychiatric consult services if additional assistance is needed in terms of risk assessment and management. If your team decides to discharge this patient, please advise the patient how to best access emergency psychiatric services, or to call 911, if their condition worsens or they feel unsafe in any way.   Keyly Melvin is a 28 year old female with a history of borderline personality disorder, depression, rule out bipolar II disorder, PTSD who presents to the ED after a suicide attempt by strangulation. On evaluation, patient noted to be poorly cooperative, significantly dysregulated, irritable, loud, tearful, psychomotor agitated, externalizing blame, linear. Patient endorses depressed mood, insomnia, anhedonia, fatigue,  poor concentration, hopelessness, worthlessness. Endorses ongoing active suicidal ideation with intent, declines to discuss the plan she has. Denies symptoms consistent with mania/hypomania, paranoia, auditory and visual hallucinations, homicidal ideation. Patient presentation is consistent with borderline personality disorder and MDD, recurrent severe, without psychotic features, PTSD per history. Patient attempted suicide tonight and continues to be dysregulated and endorsing suicidal ideation and unable to engage in safety planning. She is high risk for suicide due to prior attempts, impulsivity, depression, hopelessness. Therefore, inpatient psychiatric hospitalization is recommended for stabilization and medication management.     Adria Dill, MD Telepsychiatry Consult Services

## 2023-03-24 NOTE — ED Notes (Signed)
Pt has been changed into burgundy scrubs.   

## 2023-03-24 NOTE — ED Triage Notes (Addendum)
Pt BIB EMS from home Pt was found with cord wrapped around her neck per ems pt was hanging for 45 sec before they were able to bring her down to the floor Pt states she was dc from psych facility a few days ago and has not been able to sleep for 3 days Pt was given 5mg  Haldol IM and 5mg  Versed IM en route

## 2023-03-24 NOTE — ED Notes (Signed)
Woke pt for TTS and pt became very agitated. Attempts to calm and redirect pt failed. Pt began throwing items in the room, tossed the computer over and attempted to choke herself with EKG wire.
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
## 2023-03-25 DIAGNOSIS — F332 Major depressive disorder, recurrent severe without psychotic features: Secondary | ICD-10-CM

## 2023-03-25 MED ORDER — LORAZEPAM 2 MG/ML IJ SOLN
INTRAMUSCULAR | Status: AC
  Administered 2023-03-25: 2 mg via INTRAMUSCULAR
  Filled 2023-03-25: qty 1

## 2023-03-25 MED ORDER — LORAZEPAM 2 MG/ML IJ SOLN: 2.0000 mg | Freq: Once | INTRAMUSCULAR | Status: AC

## 2023-03-25 MED ORDER — LORAZEPAM 1 MG PO TABS
1.0000 mg | ORAL_TABLET | Freq: Two times a day (BID) | ORAL | Status: DC | PRN
  Administered 2023-03-26 – 2023-03-27 (×2): 1 mg via ORAL
  Filled 2023-03-25 (×2): qty 1

## 2023-03-25 NOTE — Progress Notes (Signed)
Pt has been accepted to [facility] TODAY 03/25/2023. Bed assignment: 300 Unit, Bed 300-B  Pt meets inpatient criteria per Dahlia Byes, NP  Attending Physician will be Salome Holmes, MD  Report can be called to: [phone] ext. [phone]  Pt can arrive after 12 PM  Care Team Notified: Dahlia Byes, NP and Toney Sang, RN  [place], [place]  03/25/2023 12:03 PM

## 2023-03-25 NOTE — ED Notes (Signed)
Call received from [facility], Sharon Ward has been accepted to their facility and can arrive today after 12 noon to the 300 unit bed 300-B  the accepting doctor being Dr. Salome Holmes. Report can be called to [phone] ext [phone] per LP intake worker at [facility] .
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
## 2023-03-25 NOTE — Progress Notes (Signed)
LCSW Progress Note  [id number]   Sharon Ward  03/25/2023  1:45 AM    Inpatient Behavioral Health Placement  Pt meets inpatient criteria per Adria Dill, MD [facility]. There are no available beds within [facility] system per [facility] [name] Brooks,RN. Referral was sent to the following facilities;   Destination  Service Provider Address Phone [facility]  [address] [place], [place] [address] [phone]  [facility] Patient Placement  [facility], [place] [phone]  [facility] [address]  [place] [address] [phone]  [facility]  [address], [place] [address] [phone]  [facility]  [address] [place] [address] [phone]  [facility]  [address] [place] [address] [phone]  [facility]  [address], [place] [address] [phone]  [facility]  [address] [place] [address] [phone]  [facility]  [address] [place] [address] [phone]  [facility]  [address] [place] [address] [phone]  Renella Cunas  [place] -- [phone]  [facility]  [address] [place], [place] [address] [phone]  [facility]  [address], [place] [address] [phone]  [facility]iat[facility] Inpatient [facility]  [place] [phone]  [facility]  [address], [place] [address] [phone]  [facility]  [address],  [place] [address] [phone]  [facility]  [facility] [place]o [place] [address] [phone]  [facility]  [address] [place] [address] [phone]  [facility] [address]  [address], [place] [address] [phone]  [facility]  [address] [place], [place] [address] [phone]  [facility]  [address] [place] [address] [phone]  [facility]  [address] [place], [place] [address] [phone]  [facility]  [address], [place] [address] [phone]  [facility]  [address] [place] [address] [phone]    Situation ongoing,  CSW will follow up.    Maryjean Ka, MSW, LCSWA 03/25/2023 1:45 AM

## 2023-03-25 NOTE — ED Notes (Signed)
Pt awake at this time, has made phone call to family and remains calm.  This RN discussed pt's actions with her and reminded her that her actions earlier today were not helpful and did not lend to her getting the help that she needed.  Pt verbalizes understanding of information shared and states that she understands that she will be going to [facility] for admission and that it will be tomorrow when that happens.  Encouraged pt to remain calm and cooperative with staff and talk through any concerns that she is having.  Pt accepts information willingly.

## 2023-03-25 NOTE — ED Notes (Signed)
While getting vital pt was to asked to use the bathroom to obtain urine sample. Pt stated she didn't have to go right now. A hat was placed in the toilet to collect sample at later time.

## 2023-03-25 NOTE — ED Notes (Signed)
Pt continues to moan and cry, pt continues to fight against restraints and roll from side to side in bed.  Sitter remains at bedside.  Will monitor.
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
## 2023-03-25 NOTE — ED Notes (Signed)
Pt arrived on the unit very upset tearful and crying loudly screaming "I don't want a shot". PO medication was offered and accepted and I was able to verbally de-escalate Sharon Ward.  She currently denies suicidal ideations was well as AV hallucinations she stated what set her off was one of the staff got nasty with her and it made her very angry.
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
## 2023-03-25 NOTE — ED Notes (Signed)
Pt into hallway yelling at pt in room 28 to "Shut up", pt also begins to yell that she can not go to [facility].  Josephine, NP, to room to talk with pt and informs her that this the only bed available and that she will have to go as she is IVC.  Pt continues to yell and cry in room.  Security on unit at this time.

## 2023-03-25 NOTE — ED Provider Notes (Signed)
Emergency Medicine Observation Re-evaluation Note  Sharon Ward is a 27 y.o. female, seen on rounds today.  Pt initially presented to the ED for complaints of Suicide Attempt Currently, the patient is under IVC and awaiting psychiatric placement.  Physical Exam  BP (!) 113/55 (BP Location: Left Arm)   Pulse 64   Temp 98.3 F (36.8 C) (Oral)   Resp 18   Ht 5\' 2"  (1.575 m)   Wt 95.3 kg   LMP 03/16/2023 (Exact Date)   SpO2 98%   BMI 38.41 kg/m  Physical Exam General: Resting comfortably Cardiac: Regular rate Lungs: No respiratory distress Psych: Calm  ED Course / MDM  EKG:EKG Interpretation Date/Time:  Thursday March 24 2023 15:05:21 EDT Ventricular Rate:  86 PR Interval:  150 QRS Duration:  89 QT Interval:  396 QTC Calculation: 474 R Axis:   30  Text Interpretation: duplicate, discard Confirmed by Dione Booze ([id number]) on 03/25/2023 1:22:04 AM  I have reviewed the labs performed to date as well as medications administered while in observation.  Recent changes in the last 24 hours include received oral Ativan overnight.  Plan  Current plan is for psychiatric hospitalization.    Laurence Spates, MD 03/25/23 [phone]

## 2023-03-25 NOTE — ED Notes (Signed)
Spoke with Albin Felling, RN at [facility], they will accept pt tomorrow AM to transport at 7AM.  Report given at this time.  Call with updates prior to pt leaving.

## 2023-03-25 NOTE — ED Notes (Signed)
Pt continues to fight against restraints, continues to yell at staff and refusing to cooperate with requests.  Security remains at bedside.

## 2023-03-26 MED ORDER — LORAZEPAM 2 MG/ML IJ SOLN: 2.0000 mg | Freq: Once | INTRAMUSCULAR | Status: DC

## 2023-03-26 MED ORDER — ZIPRASIDONE MESYLATE 20 MG IM SOLR: 20.0000 mg | Freq: Once | INTRAMUSCULAR | Status: DC

## 2023-03-26 MED ORDER — LORAZEPAM 1 MG PO TABS
2.0000 mg | ORAL_TABLET | Freq: Once | ORAL | Status: AC
  Administered 2023-03-26: 2 mg via ORAL
  Filled 2023-03-26: qty 2

## 2023-03-26 NOTE — ED Notes (Signed)
The hair tie is in locker 35 in belongings bag

## 2023-03-26 NOTE — ED Provider Notes (Signed)
Emergency Medicine Observation Re-evaluation Note  Mack Shaik is a 27 y.o. female, seen on rounds today.  Pt initially presented to the ED for complaints of Suicide Attempt Currently, the patient is awaiting TTS disposition.  Physical Exam  BP (!) 104/57 (BP Location: Left Arm)   Pulse 71   Temp 97.7 F (36.5 C) (Oral)   Resp 20   Ht 5\' 2"  (1.575 m)   Wt 95.3 kg   LMP 03/16/2023 (Exact Date)   SpO2 100%   BMI 38.41 kg/m  Physical Exam General: Agitated Cardiac: Well perfused Lungs: Even respirations Psych: Agitated and ripping up paper towels.   ED Course / MDM  EKG:EKG Interpretation Date/Time:  Thursday March 24 2023 15:05:21 EDT Ventricular Rate:  86 PR Interval:  150 QRS Duration:  89 QT Interval:  396 QTC Calculation: 474 R Axis:   30  Text Interpretation: duplicate, discard Confirmed by Dione Booze ([id number]) on 03/25/2023 1:22:04 AM  I have reviewed the labs performed to date as well as medications administered while in observation.  Recent changes in the last 24 hours include TTS evaluation and she meets inpatient criteria.  01:32 PM  Called back to the behavioral health ED with patient having increased agitation and verbal outbursts.  She has torn up some paper towels and appears somewhat agitated.  Will attempt to redirect but may ultimately require restraint and IM medications to keep her from harming herself.  Staff reports she has been banging her head on the wall but I do not appreciate any outward sign of trauma or altered mental status at this time.   Plan  Current plan is for inpatient placement.    Maia Plan, MD 03/26/23 1348

## 2023-03-26 NOTE — ED Notes (Signed)
After getting orders for restraints and meds for pt, she calmed herself after speaking with staff members.  Restraints not started and no meds given at that time.

## 2023-03-26 NOTE — Progress Notes (Addendum)
This CSW spoke with [facility] Intake and confirmed pt still has a bed offer and can transfer 03/26/23 at noon.   Pt has been accepted to [facility] TODAY 03/26/23. Bed assignment: 300 Unit, Bed 300-B   Pt meets inpatient criteria per Dahlia Byes, NP   Attending Physician will be Salome Holmes, MD   Report can be called to: [phone]. [phone]  1st shift CSW to follow up. Pt was offered [facility] but pt shared that she did not feel comfortable being admitted to a facility in [place] due to past trauma in [place]. 1st shift CSW to follow up with psych provider. Pt is IVC'd.   Maryjean Ka, MSW, LCSWA 03/26/2023 2:04 AM

## 2023-03-26 NOTE — ED Notes (Addendum)
Pt had started yelling and complaining about her situation again.  She had ask to speak with the Dr again and a note was sent.  I let pt know that they were busy and would speak with her after they finished with what they were working on.  The pt became worse.  I ask pt if she wanted something to help her calm down.  She said she did and she took Rx. I advised pt that if she can't calm down, I have an order to give her something stronger.

## 2023-03-26 NOTE — ED Notes (Signed)
Patient is still currently resting at this time. Will give meds due at the time patient wakes up. Day shift advised patient had a rough day with resting which is why she didn't wake patient up for meds when they were due.

## 2023-03-26 NOTE — ED Notes (Signed)
Pt in bed asleep at this time.  I will give meds due when pt wakes up. She has been having a lot of anxiety today about her situation and has been unable to rest.

## 2023-03-26 NOTE — ED Notes (Signed)
Pt in room crying and yelling out

## 2023-03-26 NOTE — Progress Notes (Signed)
[facility] Psych ED Progress Note  03/26/2023 12:10 PM Sharon Ward  MRN:  [id number]   Subjective: Patient has hx of MDD, Borderline Personality disorder, PTSD and  Anxiety Disorder.  Patient was brought in last for suicide attempt with a cord around her neck.  Patient was hanging for 45 seconds before EMS was able to get her down.  She was discharged August 13 th from [facility] behavioral unit. This morning patient was seen sleeping .  She was supposed to be transferred to [facility] but plan was changed as the hospital admission want her 24 hours post restraints.  Sheriff also states they could not transfer her today.  Plan is to transfer her to [facility] tomorrow. Patient is now transferred to [facility] and since arrival she has been pacing, seen banging her head on the wall and crying she does not want to stay in [facility].  Provider approached patient with security around.  Provider pleaded to patient not to harm herself and allow [place] help her.  She agrees to take oral Ativan PRN and not the injection.  PRN Ativan prescribed.  We will continue to monitor patient closely and offer support.  She denies SI/HI/AVH but based on the incident that brought her here we will continue to seek bed placement at a Psychiatry unit for treatment and stabilization. Principal Problem: Major depressive disorder, recurrent severe without psychotic features (HCC) Diagnosis:  Principal Problem:   Major depressive disorder, recurrent severe without psychotic features (HCC) Active Problems:   Borderline personality disorder (HCC)   Suicide attempt by hanging [facility])   ED Assessment Time Calculation: Start Time: 1151 Stop Time: 1209 Total Time in Minutes (Assessment Completion): 18   Past Psychiatric History: see initial Psychiatry evaluation note  [name] Scale:  Flowsheet Row ED from 03/24/2023 in [facility] Emergency Department at [facility] Admission (Discharged) from 03/18/2023 in [facility] INPATIENT BEHAVIORAL  MEDICINE ED from 03/17/2023 in [facility] Emergency Department at [facility]  C-SSRS RISK CATEGORY High Risk High Risk High Risk       Past Medical History:  Past Medical History:  Diagnosis Date   Asthma    Bipolar 1 disorder, mixed (HCC)    Depression    Generalized anxiety disorder    Intentional drug overdose (HCC) 11/03/2020   Low-lying placenta 01/07/2022   Resolved 03/04/22   Relationship dysfunction    Suicide attempt (HCC) 11/02/2020    Past Surgical History:  Procedure Laterality Date   PILONIDAL CYST / SINUS EXCISION  09/11/2013   PILONIDAL CYST EXCISION  05/17/2014   Pilonidal cystectomy with cleft lip   Family History:  Family History  Problem Relation Age of Onset   Asthma Mother    Diabetes Mother    Healthy Mother    Hypertension Father    Asthma Father    Diabetes Father    Healthy Father    Asthma Brother    Hypertension Paternal Uncle    Diabetes Paternal Grandmother    Stroke Paternal Grandfather    Heart disease Paternal Grandfather    Hypertension Paternal Grandfather    Diabetes Paternal Grandfather    Family Psychiatric  History: see initial Psychiatry evaluation note Social History:  Social History   Substance and Sexual Activity  Alcohol Use Not Currently   Comment: occasional prior to pregnancy     Social History   Substance and Sexual Activity  Drug Use Not Currently   Frequency: 4.0 times per week   Types: Marijuana  Comment: No Delta 9 since February 2023    Social History   Socioeconomic History   Marital status: Single    Spouse name: Not on file   Number of children: 1   Years of education: 10   Highest education level: 10th grade  Occupational History   Not on file  Tobacco Use   Smoking status: Former    Current packs/day: 0.00    Average packs/day: 1 pack/day for 15.0 years (15.0 ttl pk-yrs)    Types: Cigarettes    Start date: 07/14/2006    Quit date: 07/14/2021    Years since quitting: 1.6    Smokeless tobacco: Never   Tobacco comments:    only smoke a "couple" of cigarettes when stressed or anxious, socially with friends per [facility] chart  Vaping Use   Vaping status: Former   Start date: 08/10/2003   Quit date: 05/04/2021  Substance and Sexual Activity   Alcohol use: Not Currently    Comment: occasional prior to pregnancy   Drug use: Not Currently    Frequency: 4.0 times per week    Types: Marijuana    Comment: No Delta 9 since February 2023   Sexual activity: Not Currently    Partners: Male    Birth control/protection: None  Other Topics Concern   Not on file  Social History Narrative   Not on file   Social Determinants of Health   Financial Resource Strain: Not on file  Food Insecurity: No Food Insecurity (03/18/2023)   Hunger Vital Sign    Worried About Running Out of Food in the Last Year: Never true    Ran Out of Food in the Last Year: Never true  Transportation Needs: No Transportation Needs (03/18/2023)   PRAPARE - Administrator, Civil Service (Medical): No    Lack of Transportation (Non-Medical): No  Physical Activity: Not on file  Stress: Not on file  Social Connections: Not on file    Sleep: Fair  Appetite:  Poor  Current Medications: Current Facility-Administered Medications  Medication Dose Route Frequency Provider Last Rate Last Admin   DULoxetine (CYMBALTA) DR capsule 60 mg  60 mg Oral Daily Adria Dill, MD   60 mg at 03/26/23 1035   gabapentin (NEURONTIN) capsule 300 mg  300 mg Oral TID Adria Dill, MD   300 mg at 03/26/23 1035   LORazepam (ATIVAN) tablet 1 mg  1 mg Oral BID PRN Pricilla Loveless, MD       naloxone [facility]) injection 0.4 mg  0.4 mg Intravenous Once Alvira Monday, MD       risperiDONE (RISPERDAL M-TABS) disintegrating tablet 2 mg  2 mg Oral Q8H PRN Alvira Monday, MD   2 mg at 03/25/23 1958   And   ziprasidone (GEODON) injection 20 mg  20 mg Intramuscular PRN Alvira Monday, MD       traZODone (DESYREL)  tablet 50 mg  50 mg Oral QHS PRN Adria Dill, MD   50 mg at 03/25/23 2116   Current Outpatient Medications  Medication Sig Dispense Refill   hydrOXYzine (ATARAX) 25 MG tablet Take 25 mg by mouth 3 (three) times daily as needed.     DULoxetine (CYMBALTA) 60 MG capsule Take 1 capsule (60 mg total) by mouth daily. 30 capsule 0   gabapentin (NEURONTIN) 100 MG capsule Take 3 capsules (300 mg total) by mouth 3 (three) times daily. 90 capsule 0   traZODone (DESYREL) 50 MG tablet Take 1 tablet (  50 mg total) by mouth at bedtime as needed for sleep. 30 tablet 0    Lab Results:  Results for orders placed or performed during the hospital encounter of 03/24/23 (from the past 48 hour(s))  Comprehensive metabolic panel     Status: Abnormal   Collection Time: 03/24/23  3:09 PM  Result Value Ref Range   Sodium 141 135 - 145 mmol/L   Potassium 3.4 (L) 3.5 - 5.1 mmol/L   Chloride 105 98 - 111 mmol/L   CO2 18 (L) 22 - 32 mmol/L   Glucose, Bld 86 70 - 99 mg/dL    Comment: Glucose reference range applies only to samples taken after fasting for at least 8 hours.   BUN 19 6 - 20 mg/dL   Creatinine, Ser 3.29 0.44 - 1.00 mg/dL   Calcium 51.8 (H) 8.9 - 10.3 mg/dL   Total Protein 7.8 6.5 - 8.1 g/dL   Albumin 4.6 3.5 - 5.0 g/dL   AST 21 15 - 41 U/L   ALT 19 0 - 44 U/L   Alkaline Phosphatase 56 38 - 126 U/L   Total Bilirubin 0.7 0.3 - 1.2 mg/dL   GFR, Estimated >84 >16 mL/min    Comment: (NOTE) Calculated using the CKD-EPI Creatinine Equation (2021)    Anion gap 18 (H) 5 - 15    Comment: Performed at [facility], [address] [place], [place] [address]  Ethanol     Status: None   Collection Time: 03/24/23  3:09 PM  Result Value Ref Range   Alcohol, Ethyl (B) <10 <10 mg/dL    Comment: (NOTE) Lowest detectable limit for serum alcohol is 10 mg/dL.  For medical purposes only. Performed at [facility], [address], [place], [place] [address]   CBC with  Diff     Status: None   Collection Time: 03/24/23  3:09 PM  Result Value Ref Range   WBC 8.2 4.0 - 10.5 K/uL   RBC 4.73 3.87 - 5.11 MIL/uL   Hemoglobin 13.5 12.0 - 15.0 g/dL   HCT 93.2 35.5 - 73.2 %   MCV 86.3 80.0 - 100.0 fL   MCH 28.5 26.0 - 34.0 pg   MCHC 33.1 30.0 - 36.0 g/dL   RDW 20.2 54.2 - 70.6 %   Platelets 351 150 - 400 K/uL   nRBC 0.0 0.0 - 0.2 %   Neutrophils Relative % 68 %   Neutro Abs 5.7 1.7 - 7.7 K/uL   Lymphocytes Relative 23 %   Lymphs Abs 1.9 0.7 - 4.0 K/uL   Monocytes Relative 7 %   Monocytes Absolute 0.6 0.1 - 1.0 K/uL   Eosinophils Relative 1 %   Eosinophils Absolute 0.0 0.0 - 0.5 K/uL   Basophils Relative 1 %   Basophils Absolute 0.0 0.0 - 0.1 K/uL   Immature Granulocytes 0 %   Abs Immature Granulocytes 0.01 0.00 - 0.07 K/uL    Comment: Performed at [facility], [address] [place], [place] [address]  hCG, serum, qualitative     Status: None   Collection Time: 03/24/23  3:09 PM  Result Value Ref Range   Preg, Serum NEGATIVE NEGATIVE    Comment:        THE SENSITIVITY OF THIS METHODOLOGY IS >10 mIU/mL. Performed at [facility], [address], [place], [place] [address]   Salicylate level     Status: Abnormal   Collection Time: 03/24/23  3:09 PM  Result Value Ref Range   Salicylate Lvl <7.0 (L) 7.0 - 30.0 mg/dL    Comment: Performed at [facility], [address], [place], [place] [address]  Acetaminophen level     Status: Abnormal   Collection Time: 03/24/23  3:09 PM  Result Value Ref Range   Acetaminophen (Tylenol), Serum <10 (L) 10 - 30 ug/mL    Comment: (NOTE) Therapeutic concentrations vary significantly. A range of 10-30 ug/mL  may be an effective concentration for many patients. However, some  are best treated at concentrations outside of this range. Acetaminophen concentrations >150 ug/mL at 4 hours after ingestion  and >50 ug/mL at 12 hours after ingestion are often  associated with  toxic reactions.  Performed at [facility], [address] [place], [place] [address]   Acetaminophen level     Status: Abnormal   Collection Time: 03/24/23  9:04 PM  Result Value Ref Range   Acetaminophen (Tylenol), Serum <10 (L) 10 - 30 ug/mL    Comment: (NOTE) Therapeutic concentrations vary significantly. A range of 10-30 ug/mL  may be an effective concentration for many patients. However, some  are best treated at concentrations outside of this range. Acetaminophen concentrations >150 ug/mL at 4 hours after ingestion  and >50 ug/mL at 12 hours after ingestion are often associated with  toxic reactions.  Performed at [facility], [address] [place], [place] [address]     Blood Alcohol level:  Lab Results  Component Value Date   ETH <10 03/24/2023   ETH <10 03/17/2023    Physical Findings:  CIWA:    COWS:     Musculoskeletal: Strength & Muscle Tone: within normal limits Gait & Station: normal Patient leans: Front  Psychiatric Specialty Exam:  Presentation  General Appearance:  Casual; Neat  Eye Contact: Fleeting  Speech: Clear and Coherent; Pressured  Speech Volume: Increased  Handedness: Right   Mood and Affect  Mood: Anxious; Irritable; Labile (Violent)  Affect: Congruent; Labile; Tearful; Full Range   Thought Process  Thought Processes: Coherent  Descriptions of Associations:Intact  Orientation:Partial  Thought Content:Logical  History of Schizophrenia/Schizoaffective disorder:No  Duration of Psychotic Symptoms:N/A  Hallucinations:Hallucinations: None  Ideas of Reference:None  Suicidal Thoughts:Suicidal Thoughts: No  Homicidal Thoughts:Homicidal Thoughts: No   Sensorium  Memory: Immediate Good; Recent Good; Remote Fair  Judgment: Impaired  Insight: Lacking   Executive Functions  Concentration: Poor  Attention Span: Poor  Recall: Poor  Fund  of Knowledge: Poor  Language: Fair   Psychomotor Activity  Psychomotor Activity:Psychomotor Activity: Increased; Restlessness   Assets  Assets: Communication Skills   Sleep  Sleep:Sleep: Fair    Physical Exam: Physical Exam-unable to engage in any meaningful assessment/conversation. ROS-Unable to assess. Blood pressure (!) 104/57, pulse 71, temperature 97.7 F (36.5 C), temperature source Oral, resp. rate 20, height 5\' 2"  (1.575 m), weight 95.3 kg, last menstrual period 03/16/2023, SpO2 100%, not currently breastfeeding. Body mass index is 38.41 kg/m.   Medical Decision Making: Patient continues to meet inpatient Mental health care criteria. [facility] will accept patient after 24 hrs post Restraint of yesterday.  Sheriff staff will only transport patient to [facility].   Mood remains labile, she is crying, yelling stating she is not going to [place]. Problem 1: Suicide attempt  Problem 2: Borderline Personality Disorder  Problem 3: Recurrent Major Depressive disorder, severe without Psychotic features.  Earney Navy, NP-PMHNP-BC 03/26/2023, 12:10 PM

## 2023-03-26 NOTE — ED Notes (Signed)
Pt black hair tie removed from pt arm and placed into personal belongings locker per [employer]

## 2023-03-26 NOTE — ED Notes (Signed)
Patient exited her room with her food tray and asked if there was a way to heat up her dinner. I took the patients dinner plate and heated it up for her. Patient was offered drinks and asked if she would like to take her medications. Patient said yes and was cooperative. Patient has no other needs expressed at this time besides turning off her tv for her.

## 2023-03-26 NOTE — ED Notes (Signed)
Pt just moved to this area.  She is sitting in the hallway and hyperventilating and saying she doesn't want to be over here and banging her head on the wall.  She says she doesn't want to be in this area and she doesn't want to be in a white room.  She wants to be back in her other room.   She is completely aware of her surroundings and aware of what she is doing.  Ms Julieanne Cotton stopped and talked with pt about what she is doing and is ordering pt some meds after discussing with pt.

## 2023-03-27 NOTE — ED Provider Notes (Signed)
Emergency Medicine Observation Re-evaluation Note  Sharon Ward is a 27 y.o. female, seen on rounds today.  Pt initially presented to the ED for complaints of Suicide Attempt Currently, the patient is awaiting transfer.  Physical Exam  BP 114/67 (BP Location: Right Arm)   Pulse 63   Temp 98.2 F (36.8 C) (Oral)   Resp 20   Ht 5\' 2"  (1.575 m)   Wt 95.3 kg   LMP 03/16/2023 (Exact Date)   SpO2 100%   BMI 38.41 kg/m  Physical Exam General: Calm Cardiac: Well perfused Lungs: Even respirations Psych: Calm  ED Course / MDM  EKG:EKG Interpretation Date/Time:  Thursday March 24 2023 15:05:21 EDT Ventricular Rate:  86 PR Interval:  150 QRS Duration:  89 QT Interval:  396 QTC Calculation: 474 R Axis:   30  Text Interpretation: duplicate, discard Confirmed by Dione Booze ([id number]) on 03/25/2023 1:22:04 AM  I have reviewed the labs performed to date as well as medications administered while in observation.  Recent changes in the last 24 hours include accepted to psychiatry treatment center.  Plan  Current plan is for transfer to psychiatry hospital. Salome Holmes, MD is the accepting MD.     Maia Plan, MD 03/27/23 0830

## 2023-03-27 NOTE — ED Notes (Signed)
Two bath towels, wash cloth, and underwear given to pt as requested. In the shower at this time.

## 2023-03-27 NOTE — ED Notes (Signed)
Patient personal belongings taken from locker #35 and given to [employer] to be transported with her. She has 1 bra, a pair of bubble shoes, key chain, wallet and jewelry in container

## 2023-03-27 NOTE — ED Notes (Signed)
Breakfast tray has been given to pt at this time.

## 2023-03-27 NOTE — ED Notes (Addendum)
Sheriff office called to set up transportation  to have patient sent to [facility]

## 2023-03-27 NOTE — ED Notes (Signed)
Office Durwin Nora called stated he will be here shortly to transport patient
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
## 2023-04-04 ENCOUNTER — Other Ambulatory Visit: Payer: Self-pay

## 2023-04-04 ENCOUNTER — Emergency Department (HOSPITAL_COMMUNITY): Payer: MEDICAID

## 2023-04-04 ENCOUNTER — Encounter (HOSPITAL_COMMUNITY): Payer: Self-pay | Admitting: Internal Medicine

## 2023-04-04 ENCOUNTER — Encounter: Payer: Self-pay | Admitting: *Deleted

## 2023-04-04 ENCOUNTER — Inpatient Hospital Stay (HOSPITAL_COMMUNITY)
Admission: EM | Admit: 2023-04-04 | Discharge: 2023-04-12 | DRG: 917 | Disposition: A | Discharge status: Home / Self Care | Payer: MEDICAID | Attending: Internal Medicine | Admitting: Internal Medicine

## 2023-04-04 DIAGNOSIS — T43212A Poisoning by selective serotonin and norepinephrine reuptake inhibitors, intentional self-harm, initial encounter: Secondary | ICD-10-CM | POA: Diagnosis present

## 2023-04-04 DIAGNOSIS — E669 Obesity, unspecified: Secondary | ICD-10-CM | POA: Diagnosis present

## 2023-04-04 DIAGNOSIS — Z9151 Personal history of suicidal behavior: Secondary | ICD-10-CM | POA: Diagnosis not present

## 2023-04-04 DIAGNOSIS — Z825 Family history of asthma and other chronic lower respiratory diseases: Secondary | ICD-10-CM

## 2023-04-04 DIAGNOSIS — T1491XA Suicide attempt, initial encounter: Secondary | ICD-10-CM | POA: Diagnosis present

## 2023-04-04 DIAGNOSIS — T426X2A Poisoning by other antiepileptic and sedative-hypnotic drugs, intentional self-harm, initial encounter: Secondary | ICD-10-CM | POA: Diagnosis present

## 2023-04-04 DIAGNOSIS — Z6837 Body mass index (BMI) 37.0-37.9, adult: Secondary | ICD-10-CM

## 2023-04-04 DIAGNOSIS — Z79899 Other long term (current) drug therapy: Secondary | ICD-10-CM | POA: Diagnosis not present

## 2023-04-04 DIAGNOSIS — R9431 Abnormal electrocardiogram [ECG] [EKG]: Secondary | ICD-10-CM | POA: Diagnosis present

## 2023-04-04 DIAGNOSIS — Z91048 Other nonmedicinal substance allergy status: Secondary | ICD-10-CM | POA: Diagnosis not present

## 2023-04-04 DIAGNOSIS — F603 Borderline personality disorder: Secondary | ICD-10-CM | POA: Diagnosis present

## 2023-04-04 DIAGNOSIS — T50902A Poisoning by unspecified drugs, medicaments and biological substances, intentional self-harm, initial encounter: Secondary | ICD-10-CM | POA: Diagnosis not present

## 2023-04-04 DIAGNOSIS — Z91018 Allergy to other foods: Secondary | ICD-10-CM

## 2023-04-04 DIAGNOSIS — F4312 Post-traumatic stress disorder, chronic: Secondary | ICD-10-CM | POA: Diagnosis present

## 2023-04-04 DIAGNOSIS — F332 Major depressive disorder, recurrent severe without psychotic features: Secondary | ICD-10-CM | POA: Diagnosis present

## 2023-04-04 DIAGNOSIS — R112 Nausea with vomiting, unspecified: Secondary | ICD-10-CM | POA: Diagnosis present

## 2023-04-04 DIAGNOSIS — G4700 Insomnia, unspecified: Secondary | ICD-10-CM | POA: Diagnosis present

## 2023-04-04 DIAGNOSIS — J45909 Unspecified asthma, uncomplicated: Secondary | ICD-10-CM | POA: Diagnosis present

## 2023-04-04 DIAGNOSIS — Z833 Family history of diabetes mellitus: Secondary | ICD-10-CM

## 2023-04-04 DIAGNOSIS — E876 Hypokalemia: Secondary | ICD-10-CM | POA: Diagnosis not present

## 2023-04-04 DIAGNOSIS — Z8249 Family history of ischemic heart disease and other diseases of the circulatory system: Secondary | ICD-10-CM | POA: Diagnosis not present

## 2023-04-04 DIAGNOSIS — Z87891 Personal history of nicotine dependence: Secondary | ICD-10-CM

## 2023-04-04 DIAGNOSIS — Z823 Family history of stroke: Secondary | ICD-10-CM

## 2023-04-04 DIAGNOSIS — J9602 Acute respiratory failure with hypercapnia: Secondary | ICD-10-CM | POA: Diagnosis present

## 2023-04-04 DIAGNOSIS — F411 Generalized anxiety disorder: Secondary | ICD-10-CM | POA: Diagnosis present

## 2023-04-04 DIAGNOSIS — G928 Other toxic encephalopathy: Secondary | ICD-10-CM | POA: Diagnosis not present

## 2023-04-04 DIAGNOSIS — Z91040 Latex allergy status: Secondary | ICD-10-CM

## 2023-04-04 DIAGNOSIS — F4310 Post-traumatic stress disorder, unspecified: Secondary | ICD-10-CM | POA: Diagnosis present

## 2023-04-04 LAB — COMPREHENSIVE METABOLIC PANEL
ALT: 22 U/L (ref 0–44)
AST: 20 U/L (ref 15–41)
Albumin: 4.7 g/dL (ref 3.5–5.0)
Alkaline Phosphatase: 58 U/L (ref 38–126)
Anion gap: 8 (ref 5–15)
BUN: 16 mg/dL (ref 6–20)
CO2: 27 mmol/L (ref 22–32)
Calcium: 9.7 mg/dL (ref 8.9–10.3)
Chloride: 104 mmol/L (ref 98–111)
Creatinine, Ser: 0.83 mg/dL (ref 0.44–1.00)
GFR, Estimated: >60 mL/min (ref >60)
Glucose, Bld: 98 mg/dL (ref 70–99)
Potassium: 4.9 mmol/L (ref 3.5–5.1)
Sodium: 139 mmol/L (ref 135–145)
Total Bilirubin: 0.4 mg/dL (ref 0.3–1.2)
Total Protein: 8.2 g/dL — ABNORMAL HIGH (ref 6.5–8.1)

## 2023-04-04 LAB — RAPID URINE DRUG SCREEN, HOSP PERFORMED
Amphetamines: NOT DETECTED
Barbiturates: NOT DETECTED
Benzodiazepines: NOT DETECTED
Cocaine: NOT DETECTED
Opiates: NOT DETECTED
Tetrahydrocannabinol: POSITIVE — AB

## 2023-04-04 LAB — I-STAT CHEM 8, ED
BUN: 16 mg/dL (ref 6–20)
Calcium, Ion: 1.2 mmol/L (ref 1.15–1.40)
Chloride: 104 mmol/L (ref 98–111)
Creatinine, Ser: 0.9 mg/dL (ref 0.44–1.00)
Glucose, Bld: 95 mg/dL (ref 70–99)
HCT: 45 % (ref 36.0–46.0)
Hemoglobin: 15.3 g/dL — ABNORMAL HIGH (ref 12.0–15.0)
Potassium: 5 mmol/L (ref 3.5–5.1)
Sodium: 140 mmol/L (ref 135–145)
TCO2: 26 mmol/L (ref 22–32)

## 2023-04-04 LAB — CBC WITH DIFFERENTIAL/PLATELET
Abs Immature Granulocytes: 0.01 10*3/uL (ref 0.00–0.07)
Basophils Absolute: 0.1 10*3/uL (ref 0.0–0.1)
Basophils Relative: 1 %
Eosinophils Absolute: 0.2 10*3/uL (ref 0.0–0.5)
Eosinophils Relative: 3 %
HCT: 44.8 % (ref 36.0–46.0)
Hemoglobin: 14.6 g/dL (ref 12.0–15.0)
Immature Granulocytes: 0 %
Lymphocytes Relative: 33 %
Lymphs Abs: 1.9 10*3/uL (ref 0.7–4.0)
MCH: 29.2 pg (ref 26.0–34.0)
MCHC: 32.6 g/dL (ref 30.0–36.0)
MCV: 89.6 fL (ref 80.0–100.0)
Monocytes Absolute: 0.4 10*3/uL (ref 0.1–1.0)
Monocytes Relative: 7 %
Neutro Abs: 3.1 10*3/uL (ref 1.7–7.7)
Neutrophils Relative %: 56 %
Platelets: 323 10*3/uL (ref 150–400)
RBC: 5 MIL/uL (ref 3.87–5.11)
RDW: 13.8 % (ref 11.5–15.5)
WBC: 5.7 10*3/uL (ref 4.0–10.5)
nRBC: 0 % (ref 0.0–0.2)

## 2023-04-04 LAB — GLUCOSE, CAPILLARY: Glucose-Capillary: 89 mg/dL (ref 70–99)

## 2023-04-04 LAB — URINALYSIS, ROUTINE W REFLEX MICROSCOPIC
Bilirubin Urine: NEGATIVE
Glucose, UA: NEGATIVE mg/dL
Hgb urine dipstick: NEGATIVE
Ketones, ur: NEGATIVE mg/dL
Leukocytes,Ua: NEGATIVE
Nitrite: NEGATIVE
Protein, ur: NEGATIVE mg/dL
Specific Gravity, Urine: 1.006 (ref 1.005–1.030)
pH: 8 (ref 5.0–8.0)

## 2023-04-04 LAB — ACETAMINOPHEN LEVEL: Acetaminophen (Tylenol), Serum: <10 ug/mL — ABNORMAL LOW (ref 10–30)

## 2023-04-04 LAB — CBG MONITORING, ED: Glucose-Capillary: 104 mg/dL — ABNORMAL HIGH (ref 70–99)

## 2023-04-04 LAB — HCG, QUANTITATIVE, PREGNANCY: hCG, Beta Chain, Quant, S: <1 m[IU]/mL (ref <5)

## 2023-04-04 LAB — SALICYLATE LEVEL: Salicylate Lvl: <7 mg/dL — ABNORMAL LOW (ref 7.0–30.0)

## 2023-04-04 LAB — ETHANOL: Alcohol, Ethyl (B): <10 mg/dL (ref <10)

## 2023-04-04 MED ORDER — POLYETHYLENE GLYCOL 3350 17 G PO PACK: 17.0000 g | PACK | Freq: Every day | ORAL | Status: DC | PRN

## 2023-04-04 MED ORDER — ETOMIDATE 2 MG/ML IV SOLN
INTRAVENOUS | Status: AC
  Filled 2023-04-04: qty 10

## 2023-04-04 MED ORDER — DOCUSATE SODIUM 100 MG PO CAPS: 100.0000 mg | ORAL_CAPSULE | Freq: Two times a day (BID) | ORAL | Status: DC | PRN

## 2023-04-04 MED ORDER — ACETAMINOPHEN 325 MG PO TABS: 650.0000 mg | ORAL_TABLET | Freq: Once | ORAL | Status: DC

## 2023-04-04 MED ORDER — PROPOFOL 1000 MG/100ML IV EMUL
5.0000 ug/kg/min | INTRAVENOUS | Status: DC
  Administered 2023-04-04: 5 ug/kg/min via INTRAVENOUS
  Filled 2023-04-04: qty 100

## 2023-04-04 MED ORDER — SUCCINYLCHOLINE CHLORIDE 200 MG/10ML IV SOSY
PREFILLED_SYRINGE | INTRAVENOUS | Status: AC
  Filled 2023-04-04: qty 10

## 2023-04-04 MED ORDER — HEPARIN SODIUM (PORCINE) 5000 UNIT/ML IJ SOLN
5000.0000 [IU] | Freq: Three times a day (TID) | INTRAMUSCULAR | Status: DC
  Administered 2023-04-04 – 2023-04-07 (×9): 5000 [IU] via SUBCUTANEOUS
  Filled 2023-04-04 (×12): qty 1

## 2023-04-04 MED ORDER — SODIUM CHLORIDE 0.9 % IV BOLUS
1000.0000 mL | Freq: Once | INTRAVENOUS | Status: AC
  Administered 2023-04-04: 1000 mL via INTRAVENOUS

## 2023-04-04 MED ORDER — NALOXONE HCL 2 MG/2ML IJ SOSY
2.0000 mg | PREFILLED_SYRINGE | Freq: Once | INTRAMUSCULAR | Status: AC
  Administered 2023-04-04: 2 mg via INTRAVENOUS
  Filled 2023-04-04: qty 2

## 2023-04-04 NOTE — ED Provider Notes (Signed)
Home EMERGENCY DEPARTMENT AT [facility] Provider Note   CSN: [id number] Arrival date & time: 04/04/23  1338     History {Add pertinent medical, surgical, social history, OB history to HPI:1} Chief Complaint  Patient presents with   Drug Overdose    Sharon Ward is a 27 y.o. female.  Patient has a history of bipolar.  Patient took an unknown quantity of trazodone and Neurontin to kill herself.  She called her boyfriend who then called EMS.  When EMS arrived she was obtunded.  The history is provided by the EMS personnel. No language interpreter was used.  Drug Overdose This is a recurrent problem. The current episode started 1 to 2 hours ago. The problem occurs every several days. The problem has been resolved. Nothing aggravates the symptoms. Nothing relieves the symptoms. She has tried nothing for the symptoms.       Home Medications Prior to Admission medications   Medication Sig Start Date End Date Taking? Authorizing Provider  DULoxetine (CYMBALTA) 60 MG capsule Take 1 capsule (60 mg total) by mouth daily. 01/07/23   Sarita Bottom, MD  gabapentin (NEURONTIN) 100 MG capsule Take 3 capsules (300 mg total) by mouth 3 (three) times daily. 01/06/23   Sarita Bottom, MD  hydrOXYzine (ATARAX) 25 MG tablet Take 25 mg by mouth 3 (three) times daily as needed for anxiety.    [provider]  traZODone (DESYREL) 50 MG tablet Take 1 tablet (50 mg total) by mouth at bedtime as needed for sleep. 01/06/23   Sarita Bottom, MD      Allergies    Ascorbate, Citrus, Coconut flavor, Lamotrigine, Orange (diagnostic), Peach flavor, Pear, and Pineapple    Review of Systems   Review of Systems  Unable to perform ROS: Mental status change    Physical Exam Updated Vital Signs BP 129/60   Pulse 78   Temp 98.5 F (36.9 C) (Rectal)   Resp 18   LMP 03/16/2023 (Exact Date)   SpO2 99%  Physical Exam Vitals and nursing note reviewed.  Constitutional:      Appearance:  She is well-developed.     Comments: Unresponsive  HENT:     Head: Normocephalic.     Comments: Patient had poor gag reflex initially and then after 90 minutes she essentially had no gag reflex    Nose: Nose normal.  Eyes:     General: No scleral icterus.    Conjunctiva/sclera: Conjunctivae normal.  Neck:     Thyroid: No thyromegaly.  Cardiovascular:     Rate and Rhythm: Normal rate and regular rhythm.     Heart sounds: No murmur heard.    No friction rub. No gallop.  Pulmonary:     Breath sounds: No stridor. No wheezing or rales.  Chest:     Chest wall: No tenderness.  Abdominal:     General: There is no distension.     Tenderness: There is no abdominal tenderness. There is no rebound.  Musculoskeletal:        General: Normal range of motion.     Cervical back: Neck supple.  Lymphadenopathy:     Cervical: No cervical adenopathy.  Skin:    Findings: No erythema or rash.  Neurological:     Motor: No abnormal muscle tone.     Coordination: Coordination normal.     Comments: Patient is not responding to verbal or painful stimuli.     ED Results / Procedures / Treatments   Labs (all labs  ordered are listed, but only abnormal results are displayed) Labs Reviewed  COMPREHENSIVE METABOLIC PANEL - Abnormal; Notable for the following components:      Result Value   Total Protein 8.2 (*)    All other components within normal limits  SALICYLATE LEVEL - Abnormal; Notable for the following components:   Salicylate Lvl <7.0 (*)    All other components within normal limits  RAPID URINE DRUG SCREEN, HOSP PERFORMED - Abnormal; Notable for the following components:   Tetrahydrocannabinol POSITIVE (*)    All other components within normal limits  ACETAMINOPHEN LEVEL - Abnormal; Notable for the following components:   Acetaminophen (Tylenol), Serum <10 (*)    All other components within normal limits  CBG MONITORING, ED - Abnormal; Notable for the following components:    Glucose-Capillary 104 (*)    All other components within normal limits  I-STAT CHEM 8, ED - Abnormal; Notable for the following components:   Hemoglobin 15.3 (*)    All other components within normal limits  CBC WITH DIFFERENTIAL/PLATELET  HCG, QUANTITATIVE, PREGNANCY  ETHANOL  URINALYSIS, ROUTINE W REFLEX MICROSCOPIC  I-STAT CHEM 8, ED    EKG None  Radiology CT Head Wo Contrast  Result Date: 04/04/2023 CLINICAL DATA:  Neuro deficit, acute, stroke suspected EXAM: CT HEAD WITHOUT CONTRAST TECHNIQUE: Contiguous axial images were obtained from the base of the skull through the vertex without intravenous contrast. RADIATION DOSE REDUCTION: This exam was performed according to the departmental dose-optimization program which includes automated exposure control, adjustment of the mA and/or kV according to patient size and/or use of iterative reconstruction technique. COMPARISON:  CT Head 03/24/23 FINDINGS: Brain: No evidence of acute infarction, hemorrhage, hydrocephalus, extra-axial collection or mass lesion/mass effect. Vascular: No hyperdense vessel or unexpected calcification. Skull: Normal. Negative for fracture or focal lesion. Sinuses/Orbits: No middle ear or mastoid effusion. Paranasal sinuses are clear. Orbits are unremarkable. Other: None. IMPRESSION: No acute intracranial abnormality. Electronically Signed   By: Lorenza Cambridge M.D.   On: 04/04/2023 15:27    Procedures Procedure Name: Intubation Date/Time: 04/04/2023 3:51 PM  Performed by: Bethann Berkshire, MDPre-anesthesia Checklist: Patient identified, Patient being monitored, Emergency Drugs available, Timeout performed and Suction available Oxygen Delivery Method: Non-rebreather mask Preoxygenation: Pre-oxygenation with 100% oxygen Induction Type: Rapid sequence Ventilation: Mask ventilation without difficulty Laryngoscope Size: Glidescope Grade View: Grade II Tube size: 7.5 mm Number of attempts: 1 Placement Confirmation: ETT  inserted through vocal cords under direct vision, CO2 detector and Breath sounds checked- equal and bilateral Comments: Patient was given etomidate and succinylcholine and was intubated without problemse      {Document cardiac monitor, telemetry assessment procedure when appropriate:1}  Medications Ordered in ED Medications  succinylcholine (ANECTINE) 200 MG/10ML syringe (has no administration in time range)  etomidate (AMIDATE) 2 MG/ML injection (has no administration in time range)  propofol (DIPRIVAN) 1000 MG/100ML infusion (has no administration in time range)  sodium chloride 0.9 % bolus 1,000 mL (1,000 mLs Intravenous New Bag/Given 04/04/23 1409)  naloxone (NARCAN) injection 2 mg (2 mg Intravenous Given 04/04/23 1358)    ED Course/ Medical Decision Making/ A&P   {  CRITICAL CARE Performed by: Bethann Berkshire Total critical care time: 65 minutes Critical care time was exclusive of separately billable procedures and treating other patients. Critical care was necessary to treat or prevent imminent or life-threatening deterioration. Critical care was time spent personally by me on the following activities: development of treatment plan with patient and/or surrogate as well as nursing, discussions  with consultants, evaluation of patient's response to treatment, examination of patient, obtaining history from patient or surrogate, ordering and performing treatments and interventions, ordering and review of laboratory studies, ordering and review of radiographic studies, pulse oximetry and re-evaluation of patient's condition.    Patient had no gag reflex.  She was intubated to protect her airway. Click here for ABCD2, HEART and other calculatorsREFRESH Note before signing :1}                              Medical Decision Making Amount and/or Complexity of Data Reviewed Labs: ordered. Radiology: ordered. ECG/medicine tests: ordered.  Risk Prescription drug management.   Patient was  intubated without problems.  Critical care came down to see the patient  {Document critical care time when appropriate:1} {Document review of labs and clinical decision tools ie heart score, Chads2Vasc2 etc:1}  {Document your independent review of radiology images, and any outside records:1} {Document your discussion with family members, caretakers, and with consultants:1} {Document social determinants of health affecting pt's care:1} {Document your decision making why or why not admission, treatments were needed:1} Final Clinical Impression(s) / ED Diagnoses Final diagnoses:  None    Rx / DC Orders ED Discharge Orders     None

## 2023-04-04 NOTE — Progress Notes (Signed)
Pt was extubated shortly after being intubated due to pt waking up. MD Dr. Katrinka Blazing at bedside.

## 2023-04-04 NOTE — H&P (Signed)
NAME:  Adalicia Boisselle, MRN:  [id number], DOB:  08-22-1995, LOS: 0 ADMISSION DATE:  04/04/2023, CONSULTATION DATE:  04/04/23 REFERRING MD:  Estell Harpin, CHIEF COMPLAINT:  OD   History of Present Illness:  27 year old w/ hx of multiple psychosocial issues, MDD, recurrent suicide attempts presenting after intentional overdose of gabapentin and trazodone.  Poison control recs observation.  Not protecting airway in ER so intubated.  At my time of eval she is reaching for tube despite being on very high prop drip including a push.  Hx per chart review.   Pertinent  Medical History   Past Medical History:  Diagnosis Date   Asthma    Bipolar 1 disorder, mixed (HCC)    Depression    Generalized anxiety disorder    Intentional drug overdose (HCC) 11/03/2020   Low-lying placenta 01/07/2022   Resolved 03/04/22   Relationship dysfunction    Suicide attempt (HCC) 11/02/2020     Significant Hospital Events: Including procedures, antibiotic start and stop dates in addition to other pertinent events   8/26 admit  Interim History / Subjective:  admit  Objective   Blood pressure 129/60, pulse 78, temperature 98.5 F (36.9 C), temperature source Rectal, resp. rate 18, last menstrual period 03/16/2023, SpO2 99%, not currently breastfeeding.    Vent Mode: PRVC FiO2 (%):  [100 %] 100 % Set Rate:  [16 bmp] 16 bmp Vt Set:  [400 mL] 400 mL PEEP:  [5 cmH20] 5 cmH20 Plateau Pressure:  [12 cmH20] 12 cmH20  No intake or output data in the 24 hours ending 04/04/23 1550 There were no vitals filed for this visit.  Examination: General: young woman thrashing around in bed HENT: small-moderate thin secretions Lungs: deferred, she is thrashing violently Cardiovascular: sinus tach on monitor Abdomen: deferred, she is thrashing violently Extremities: no gross deformities, does have a bruise on LLE Neuro: moving everything, reaching purposefully GU: foley in place  Labs look benign CT head neg  Resolved  Hospital Problem list   N/A  Assessment & Plan:  Suicide attempt, recurrent: trazodone and gabapentin this time Bipolar 1, MDD Substance-induced coma improved after intubation now appears to be protecting airway  - Extubate - Suicide precautions - Psych consult, anticipate will be medically ready for transfer as early as tomorrow - Okay to watch in ICU overnight given degree of presenting coma  Best Practice (right click and "Reselect all SmartList Selections" daily)   Diet/type: NPO DVT prophylaxis: prophylactic heparin  GI prophylaxis: N/A Lines: N/A Foley:  will order removal tomorrow Code Status:  full code Last date of multidisciplinary goals of care discussion [pending]  Labs   CBC: Recent Labs  Lab 04/04/23 1406 04/04/23 1414  WBC  --  5.7  NEUTROABS  --  3.1  HGB 15.3* 14.6  HCT 45.0 44.8  MCV  --  89.6  PLT  --  323    Basic Metabolic Panel: Recent Labs  Lab 04/04/23 1406 04/04/23 1414  NA 140 139  K 5.0 4.9  CL 104 104  CO2  --  27  GLUCOSE 95 98  BUN 16 16  CREATININE 0.90 0.83  CALCIUM  --  9.7   GFR: Estimated Creatinine Clearance: 111.6 mL/min (by C-G formula based on SCr of 0.83 mg/dL). Recent Labs  Lab 04/04/23 1414  WBC 5.7    Liver Function Tests: Recent Labs  Lab 04/04/23 1414  AST 20  ALT 22  ALKPHOS 58  BILITOT 0.4  PROT 8.2*  ALBUMIN 4.7  No results for input(s): "LIPASE", "AMYLASE" in the last 168 hours. No results for input(s): "AMMONIA" in the last 168 hours.  ABG    Component Value Date/Time   TCO2 26 04/04/2023 1406     Coagulation Profile: No results for input(s): "INR", "PROTIME" in the last 168 hours.  Cardiac Enzymes: No results for input(s): "CKTOTAL", "CKMB", "CKMBINDEX", "TROPONINI" in the last 168 hours.  HbA1C: Hgb A1c MFr Bld  Date/Time Value Ref Range Status  03/20/2023 06:55 AM 5.2 4.8 - 5.6 % Final    Comment:    (NOTE)         Prediabetes: 5.7 - 6.4         Diabetes: >6.4          Glycemic control for adults with diabetes: <7.0   10/19/2022 06:30 AM 5.2 4.8 - 5.6 % Final    Comment:    (NOTE)         Prediabetes: 5.7 - 6.4         Diabetes: >6.4         Glycemic control for adults with diabetes: <7.0     CBG: Recent Labs  Lab 04/04/23 1402  GLUCAP 104*    Review of Systems:   Too encephalopathic  Past Medical History:  She,  has a past medical history of Asthma, Bipolar 1 disorder, mixed (HCC), Depression, Generalized anxiety disorder, Intentional drug overdose (HCC) (11/03/2020), Low-lying placenta (01/07/2022), Relationship dysfunction, and Suicide attempt (HCC) (11/02/2020).   Surgical History:   Past Surgical History:  Procedure Laterality Date   PILONIDAL CYST / SINUS EXCISION  09/11/2013   PILONIDAL CYST EXCISION  05/17/2014   Pilonidal cystectomy with cleft lip     Social History:   reports that she quit smoking about 20 months ago. Her smoking use included cigarettes. She started smoking about 16 years ago. She has a 15 pack-year smoking history. She has never used smokeless tobacco. She reports that she does not currently use alcohol. She reports that she does not currently use drugs after having used the following drugs: Marijuana. Frequency: 4.00 times per week.   Family History:  Her family history includes Asthma in her brother, father, and mother; Diabetes in her father, mother, paternal grandfather, and paternal grandmother; Healthy in her father and mother; Heart disease in her paternal grandfather; Hypertension in her father, paternal grandfather, and paternal uncle; Stroke in her paternal grandfather.   Allergies Allergies  Allergen Reactions   Ascorbate Rash   Citrus Rash   Coconut Flavor Rash   Lamotrigine Rash   Orange (Diagnostic) Rash   Peach Flavor Rash   Pear Rash   Pineapple Rash     Home Medications  Prior to Admission medications   Medication Sig Start Date End Date Taking? Authorizing Provider  DULoxetine  (CYMBALTA) 60 MG capsule Take 1 capsule (60 mg total) by mouth daily. 01/07/23   Sarita Bottom, MD  gabapentin (NEURONTIN) 100 MG capsule Take 3 capsules (300 mg total) by mouth 3 (three) times daily. 01/06/23   Sarita Bottom, MD  hydrOXYzine (ATARAX) 25 MG tablet Take 25 mg by mouth 3 (three) times daily as needed for anxiety.    [provider]  traZODone (DESYREL) 50 MG tablet Take 1 tablet (50 mg total) by mouth at bedtime as needed for sleep. 01/06/23   Sarita Bottom, MD     Critical care time: 31 mins

## 2023-04-04 NOTE — Procedures (Signed)
Extubation Procedure Note  Patient Details:   Name: Sharon Ward DOB: October 29, 1995 MRN: [id number]   Airway Documentation:    Vent end date: 04/04/23 Vent end time: 1548   Evaluation  O2 sats: stable throughout Complications: No apparent complications Patient did tolerate procedure well. Bilateral Breath Sounds: Clear, Diminished   Yes  Suzan Garibaldi 04/04/2023, 3:49 PM

## 2023-04-04 NOTE — ED Notes (Signed)
Patient got extubated. Patient become confuse and restless. EDP informed. 4 point restraints applied.

## 2023-04-04 NOTE — Progress Notes (Addendum)
eLink Physician-Brief Progress Note [name] DOB: 06-01-1996 MRN: [id number]   Date of Service  04/04/2023  HPI/Events of Note  27 year old with history of recurrent suicide attempts presented with intentional overdose of gabapentin and trazodone.  Poison control recommendation to repeat Tylenol level in the a.m.  eICU Interventions  Added a.m. labs including Tylenol level.   0000 -continue Foley order, once mentation improves can readdress with day team  Intervention Category Minor Interventions: Routine modifications to care plan (e.g. PRN medications for pain, fever)  Vontae Court 04/04/2023, 9:21 PM

## 2023-04-04 NOTE — ED Notes (Signed)
After 10 mins of intubation patient woke up and start to remove all the lines. Admitting Dr. Lenna Gilford to extubate patient. Patient very agitated and restless.

## 2023-04-04 NOTE — ED Notes (Signed)
ED TO INPATIENT HANDOFF REPORT  Name/Age/Gender Sharon Ward 27 y.o. female  Code Status    Code Status Orders  (From admission, onward)           Start     Ordered   04/04/23 1550  Full code  Continuous       Question:  By:  Answer:  Default: patient does not have capacity for decision making, no surrogate or prior directive available   04/04/23 1549           Code Status History     Date Active Date Inactive Code Status Order ID Comments User Context   03/24/2023 1549 03/27/2023 1432 Full Code [id number]  Darnelle Catalan, RN ED   03/18/2023 1437 03/22/2023 1710 Full Code [id number]  Louie Boston, PMHNP Inpatient   03/17/2023 0625 03/18/2023 1359 Full Code [id number]  Glynn Octave, MD ED   01/01/2023 2241 01/06/2023 1646 Full Code [id number]  Chales Abrahams, NP Inpatient   01/01/2023 2241 01/01/2023 2241 Full Code [id number]  Chales Abrahams, NP Inpatient   12/31/2022 1237 01/01/2023 2236 Full Code [id number]  Glynn Octave, MD ED   12/20/2022 1254 12/24/2022 1652 Full Code [id number]  Meta Hatchet, PA Inpatient   12/19/2022 2226 12/20/2022 1253 Full Code [id number]  Maricela Bo, NP ED   10/18/2022 1128 10/21/2022 1735 Full Code [id number]  Louie Boston, PMHNP Inpatient   10/17/2022 0622 10/18/2022 1117 Full Code [id number]  Dione Booze, MD ED   10/04/2022 2111 10/05/2022 1412 Full Code [id number]  Sindy Guadeloupe, NP ED   08/30/2022 2238 09/03/2022 1637 Full Code [id number]  Earney Navy, NP Inpatient   08/30/2022 2238 08/30/2022 2238 Full Code [id number]  Earney Navy, NP Inpatient   08/30/2022 0322 08/30/2022 2152 Full Code [id number]  Roxy Horseman, PA-C ED   08/21/2022 0025 08/21/2022 1811 Full Code [id number]  Jasper Riling, NP ED   07/30/2022 1601 08/05/2022 1609 Full Code [id number]  Sindy Guadeloupe, NP Inpatient   07/12/2022 2122 07/17/2022 1530 Full Code [id number]  Cecilie Lowers, FNP Inpatient   05/29/2022 2215 05/31/2022 1638 Full Code [id number]   Myrtie Hawk, DO Inpatient   05/28/2022 2151 05/29/2022 2205 Full Code [id number]  Bernerd Limbo, CNM Inpatient   11/09/2021 1739 11/13/2021 1926 Full Code [id number]  Cecilie Lowers, FNP Inpatient   05/04/2021 1710 05/11/2021 1556 Full Code [id number]  Thermon Leyland, NP Inpatient   05/03/2021 2118 05/04/2021 1505 Full Code [id number]  Koleen Distance, MD ED   04/26/2021 1135 04/28/2021 1643 Full Code [id number]  Lorre Nick, MD ED   04/20/2021 0416 04/22/2021 1620 Full Code [id number]  Dierdre Forth, PA-C ED   04/02/2021 1544 04/06/2021 2249 Full Code [id number]  Layla Barter, NP Inpatient   04/01/2021 2111 04/02/2021 1444 Full Code [id number]  Fayrene Helper, PA-C ED   04/01/2021 1908 04/01/2021 2035 Full Code [id number]  Talmage Nap, NP ED   02/05/2021 0437 02/10/2021 1837 Full Code [id number]  Jackelyn Poling, NP Inpatient   02/04/2021 2327 02/05/2021 0435 Full Code [id number]  Jackelyn Poling, NP ED   11/02/2020 1008 11/07/2020 1803 Full Code [id number]  Jae Dire, MD ED   10/25/2020 1621 10/28/2020 1552 Full Code [id number]  Loletta Parish, NP Inpatient   10/24/2020 0626 10/25/2020 1618 Full Code [id number]  Paula Libra, MD ED   06/15/2020 0059 06/16/2020 1206 Full Code [id number]  Jacalyn Lefevre, MD ED   01/14/2020 6378 01/16/2020 2051 Full  Code [id number]  Joselyn Arrow, MD Inpatient   01/14/2020 0059 01/14/2020 0639 Full Code [id number]  Joselyn Arrow, MD Inpatient       Home/SNF/Other Home  Chief Complaint Suicide attempt [facility]) [T14.91XA]  Level of Care/Admitting Diagnosis ED Disposition     ED Disposition  Admit   Condition  --   Comment  Hospital Area: [facility] [[id number]]  Level of Care: ICU [6]  May admit patient to [facility] or [facility] if equivalent level of care is available:: No  Covid Evaluation: Asymptomatic - no recent exposure (last 10 days) testing not required  Diagnosis: Suicide attempt [facility]) [[id number]]  Admitting Physician: Lorin Glass [[id number]]  Attending Physician: Lorin Glass [[id number]]  Certification:: I certify this patient will need inpatient services for at least 2 midnights  Expected Medical Readiness: 04/05/2023          Medical History Past Medical History:  Diagnosis Date   Asthma    Bipolar 1 disorder, mixed (HCC)    Depression    Generalized anxiety disorder    Intentional drug overdose (HCC) 11/03/2020   Low-lying placenta 01/07/2022   Resolved 03/04/22   Relationship dysfunction    Suicide attempt (HCC) 11/02/2020    Allergies Allergies  Allergen Reactions   Ascorbate Rash   Citrus Rash   Coconut Flavor Rash   Lamotrigine Rash   Orange (Diagnostic) Rash   Peach Flavor Rash   Pear Rash   Pineapple Rash    IV Location/Drains/Wounds Patient Lines/Drains/Airways Status     Active Line/Drains/Airways     Name Placement date Placement time Site Days   Peripheral IV 04/04/23 20 G Left Antecubital 04/04/23  1348  Antecubital  less than 1   Peripheral IV 04/04/23 20 G Right Antecubital 04/04/23  1546  Antecubital  less than 1   Urethral Catheter REGINA HOPKINS Latex 16 Fr. 04/04/23  1425  Latex  less than 1            Labs/Imaging Results for orders placed or performed during the hospital encounter of 04/04/23 (from the past 48 hour(s))  hCG, quantitative, pregnancy     Status: None   Collection Time: 04/04/23  1:46 PM  Result Value Ref Range   hCG, Beta Chain, Quant, S <1 <5 mIU/mL    Comment:          GEST. AGE      CONC.  (mIU/mL)   <=1 WEEK        5 - 50     2 WEEKS       50 - 500     3 WEEKS       100 - 10,000     4 WEEKS     1,000 - 30,000     5 WEEKS     3,500 - 115,000   6-8 WEEKS     12,000 - 270,000    12 WEEKS     15,000 - 220,000        FEMALE AND NON-PREGNANT FEMALE:     LESS THAN 5 mIU/mL Performed at [facility], [address], [place], [place] [address]   Acetaminophen level     Status: Abnormal   Collection Time: 04/04/23  1:47 PM   Result Value Ref Range   Acetaminophen (Tylenol), Serum <10 (L) 10 - 30 ug/mL    Comment: (NOTE) Therapeutic concentrations vary significantly. A range of 10-30 ug/mL  may be an effective  concentration for many patients. However, some  are best treated at concentrations outside of this range. Acetaminophen concentrations >150 ug/mL at 4 hours after ingestion  and >50 ug/mL at 12 hours after ingestion are often associated with  toxic reactions.  Performed at [facility], [address] [place], [place] [address]   CBG monitoring, ED     Status: Abnormal   Collection Time: 04/04/23  2:02 PM  Result Value Ref Range   Glucose-Capillary 104 (H) 70 - 99 mg/dL    Comment: Glucose reference range applies only to samples taken after fasting for at least 8 hours.  I-stat chem 8, ED (not at [facility], [facility] or [facility])     Status: Abnormal   Collection Time: 04/04/23  2:06 PM  Result Value Ref Range   Sodium 140 135 - 145 mmol/L   Potassium 5.0 3.5 - 5.1 mmol/L   Chloride 104 98 - 111 mmol/L   BUN 16 6 - 20 mg/dL   Creatinine, Ser 6.04 0.44 - 1.00 mg/dL   Glucose, Bld 95 70 - 99 mg/dL    Comment: Glucose reference range applies only to samples taken after fasting for at least 8 hours.   Calcium, Ion 1.20 1.15 - 1.40 mmol/L   TCO2 26 22 - 32 mmol/L   Hemoglobin 15.3 (H) 12.0 - 15.0 g/dL   HCT 54.0 98.1 - 19.1 %  CBC with Differential     Status: None   Collection Time: 04/04/23  2:14 PM  Result Value Ref Range   WBC 5.7 4.0 - 10.5 K/uL   RBC 5.00 3.87 - 5.11 MIL/uL   Hemoglobin 14.6 12.0 - 15.0 g/dL   HCT 47.8 29.5 - 62.1 %   MCV 89.6 80.0 - 100.0 fL   MCH 29.2 26.0 - 34.0 pg   MCHC 32.6 30.0 - 36.0 g/dL   RDW 30.8 65.7 - 84.6 %   Platelets 323 150 - 400 K/uL   nRBC 0.0 0.0 - 0.2 %   Neutrophils Relative % 56 %   Neutro Abs 3.1 1.7 - 7.7 K/uL   Lymphocytes Relative 33 %   Lymphs Abs 1.9 0.7 - 4.0 K/uL   Monocytes Relative 7 %   Monocytes Absolute 0.4 0.1 - 1.0 K/uL    Eosinophils Relative 3 %   Eosinophils Absolute 0.2 0.0 - 0.5 K/uL   Basophils Relative 1 %   Basophils Absolute 0.1 0.0 - 0.1 K/uL   Immature Granulocytes 0 %   Abs Immature Granulocytes 0.01 0.00 - 0.07 K/uL    Comment: Performed at [facility], [address], [place], [place] [address]  Comprehensive metabolic panel     Status: Abnormal   Collection Time: 04/04/23  2:14 PM  Result Value Ref Range   Sodium 139 135 - 145 mmol/L   Potassium 4.9 3.5 - 5.1 mmol/L   Chloride 104 98 - 111 mmol/L   CO2 27 22 - 32 mmol/L   Glucose, Bld 98 70 - 99 mg/dL    Comment: Glucose reference range applies only to samples taken after fasting for at least 8 hours.   BUN 16 6 - 20 mg/dL   Creatinine, Ser 2.84 0.44 - 1.00 mg/dL   Calcium 9.7 8.9 - 13.2 mg/dL   Total Protein 8.2 (H) 6.5 - 8.1 g/dL   Albumin 4.7 3.5 - 5.0 g/dL   AST 20 15 - 41 U/L   ALT 22 0 - 44 U/L   Alkaline Phosphatase 58  38 - 126 U/L   Total Bilirubin 0.4 0.3 - 1.2 mg/dL   GFR, Estimated >47 >82 mL/min    Comment: (NOTE) Calculated using the CKD-EPI Creatinine Equation (2021)    Anion gap 8 5 - 15    Comment: Performed at [facility], [address] [place], [place] [address]  Salicylate level     Status: Abnormal   Collection Time: 04/04/23  2:15 PM  Result Value Ref Range   Salicylate Lvl <7.0 (L) 7.0 - 30.0 mg/dL    Comment: Performed at [facility], [address], [place], [place] [address]  Ethanol     Status: None   Collection Time: 04/04/23  2:15 PM  Result Value Ref Range   Alcohol, Ethyl (B) <10 <10 mg/dL    Comment: (NOTE) Lowest detectable limit for serum alcohol is 10 mg/dL.  For medical purposes only. Performed at [facility], [address] [place], [place] [address]   Rapid urine drug screen (hospital performed)     Status: Abnormal   Collection Time: 04/04/23  2:22 PM  Result Value Ref Range   Opiates NONE DETECTED  NONE DETECTED   Cocaine NONE DETECTED NONE DETECTED   Benzodiazepines NONE DETECTED NONE DETECTED   Amphetamines NONE DETECTED NONE DETECTED   Tetrahydrocannabinol POSITIVE (A) NONE DETECTED   Barbiturates NONE DETECTED NONE DETECTED    Comment: (NOTE) DRUG SCREEN FOR MEDICAL PURPOSES ONLY.  IF CONFIRMATION IS NEEDED FOR ANY PURPOSE, NOTIFY LAB WITHIN 5 DAYS.  LOWEST DETECTABLE LIMITS FOR URINE DRUG SCREEN Drug Class                     Cutoff (ng/mL) Amphetamine and metabolites    1000 Barbiturate and metabolites    200 Benzodiazepine                 200 Opiates and metabolites        300 Cocaine and metabolites        300 THC                            50 Performed at [facility], [address] [place], [place] [address]   Urinalysis, Routine w reflex microscopic -Urine, Catheterized     Status: None   Collection Time: 04/04/23  2:22 PM  Result Value Ref Range   Color, Urine YELLOW YELLOW   APPearance CLEAR CLEAR   Specific Gravity, Urine 1.006 1.005 - 1.030   pH 8.0 5.0 - 8.0   Glucose, UA NEGATIVE NEGATIVE mg/dL   Hgb urine dipstick NEGATIVE NEGATIVE   Bilirubin Urine NEGATIVE NEGATIVE   Ketones, ur NEGATIVE NEGATIVE mg/dL   Protein, ur NEGATIVE NEGATIVE mg/dL   Nitrite NEGATIVE NEGATIVE   Leukocytes,Ua NEGATIVE NEGATIVE    Comment: Performed at [facility], [address], [place], [place] [address]   DG Chest Port 1 View  Result Date: 04/04/2023 CLINICAL DATA:  Shortness of breath EXAM: PORTABLE CHEST 1 VIEW COMPARISON:  03/24/2023 FINDINGS: Lungs are clear.  No pleural effusion or pneumothorax. The heart is normal in size. IMPRESSION: No acute cardiopulmonary disease. Electronically Signed   By: Charline Bills M.D.   On: 04/04/2023 15:53   CT Head Wo Contrast  Result Date: 04/04/2023 CLINICAL DATA:  Neuro deficit, acute, stroke suspected EXAM: CT HEAD WITHOUT CONTRAST TECHNIQUE: Contiguous axial images were obtained  from the base of  the skull through the vertex without intravenous contrast. RADIATION DOSE REDUCTION: This exam was performed according to the departmental dose-optimization program which includes automated exposure control, adjustment of the mA and/or kV according to patient size and/or use of iterative reconstruction technique. COMPARISON:  CT Head 03/24/23 FINDINGS: Brain: No evidence of acute infarction, hemorrhage, hydrocephalus, extra-axial collection or mass lesion/mass effect. Vascular: No hyperdense vessel or unexpected calcification. Skull: Normal. Negative for fracture or focal lesion. Sinuses/Orbits: No middle ear or mastoid effusion. Paranasal sinuses are clear. Orbits are unremarkable. Other: None. IMPRESSION: No acute intracranial abnormality. Electronically Signed   By: Lorenza Cambridge M.D.   On: 04/04/2023 15:27    Pending Labs Unresulted Labs (From admission, onward)     Start     Ordered   04/04/23 1549  HIV Antibody (routine testing w rflx)  (HIV Antibody (Routine testing w reflex) panel)  Once,   R        04/04/23 1549            Vitals/Pain Today's Vitals   04/04/23 1350 04/04/23 1355 04/04/23 1414 04/04/23 1545  BP: 134/89   129/60  Pulse: 78     Resp: 15   18  Temp:  98.3 F (36.8 C) 98.5 F (36.9 C)   TempSrc:  Axillary Rectal   SpO2: 99%       Isolation Precautions No active isolations  Medications Medications  succinylcholine (ANECTINE) 200 MG/10ML syringe (has no administration in time range)  etomidate (AMIDATE) 2 MG/ML injection (has no administration in time range)  docusate sodium (COLACE) capsule 100 mg (has no administration in time range)  polyethylene glycol (MIRALAX / GLYCOLAX) packet 17 g (has no administration in time range)  heparin injection 5,000 Units (has no administration in time range)  sodium chloride 0.9 % bolus 1,000 mL (1,000 mLs Intravenous New Bag/Given 04/04/23 1409)  naloxone (NARCAN) injection 2 mg (2 mg Intravenous Given  04/04/23 1358)    Mobility walks with person assist

## 2023-04-04 NOTE — Progress Notes (Signed)
Pt intubated in ED with no complications at this time.

## 2023-04-04 NOTE — ED Triage Notes (Signed)
Pt here from home with c/o OD on Neurontin and trazodone , and possible others meds , pt comes in non responding , has had SI attempts in the past

## 2023-04-05 DIAGNOSIS — G928 Other toxic encephalopathy: Secondary | ICD-10-CM

## 2023-04-05 DIAGNOSIS — J9602 Acute respiratory failure with hypercapnia: Secondary | ICD-10-CM | POA: Diagnosis present

## 2023-04-05 DIAGNOSIS — T1491XA Suicide attempt, initial encounter: Secondary | ICD-10-CM | POA: Diagnosis not present

## 2023-04-05 HISTORY — DX: Acute respiratory failure with hypercapnia: J96.02

## 2023-04-05 HISTORY — DX: Other toxic encephalopathy: G92.8

## 2023-04-05 LAB — CBC
HCT: 39.6 % (ref 36.0–46.0)
Hemoglobin: 12.4 g/dL (ref 12.0–15.0)
MCH: 28.4 pg (ref 26.0–34.0)
MCHC: 31.3 g/dL (ref 30.0–36.0)
MCV: 90.8 fL (ref 80.0–100.0)
Platelets: 224 10*3/uL (ref 150–400)
RBC: 4.36 MIL/uL (ref 3.87–5.11)
RDW: 14 % (ref 11.5–15.5)
WBC: 5.4 10*3/uL (ref 4.0–10.5)
nRBC: 0 % (ref 0.0–0.2)

## 2023-04-05 LAB — COMPREHENSIVE METABOLIC PANEL
ALT: 19 U/L (ref 0–44)
AST: 19 U/L (ref 15–41)
Albumin: 3.9 g/dL (ref 3.5–5.0)
Alkaline Phosphatase: 47 U/L (ref 38–126)
Anion gap: 12 (ref 5–15)
BUN: 15 mg/dL (ref 6–20)
CO2: 24 mmol/L (ref 22–32)
Calcium: 8.9 mg/dL (ref 8.9–10.3)
Chloride: 104 mmol/L (ref 98–111)
Creatinine, Ser: 0.78 mg/dL (ref 0.44–1.00)
GFR, Estimated: >60 mL/min (ref >60)
Glucose, Bld: 121 mg/dL — ABNORMAL HIGH (ref 70–99)
Potassium: 3.2 mmol/L — ABNORMAL LOW (ref 3.5–5.1)
Sodium: 140 mmol/L (ref 135–145)
Total Bilirubin: 0.3 mg/dL (ref 0.3–1.2)
Total Protein: 6.9 g/dL (ref 6.5–8.1)

## 2023-04-05 LAB — HIV ANTIBODY (ROUTINE TESTING W REFLEX): HIV Screen 4th Generation wRfx: NONREACTIVE

## 2023-04-05 LAB — MAGNESIUM: Magnesium: 2.2 mg/dL (ref 1.7–2.4)

## 2023-04-05 LAB — GLUCOSE, CAPILLARY: Glucose-Capillary: 103 mg/dL — ABNORMAL HIGH (ref 70–99)

## 2023-04-05 LAB — ACETAMINOPHEN LEVEL: Acetaminophen (Tylenol), Serum: <10 ug/mL — ABNORMAL LOW (ref 10–30)

## 2023-04-05 MED ORDER — HALOPERIDOL LACTATE 5 MG/ML IJ SOLN
INTRAMUSCULAR | Status: AC
  Administered 2023-04-05: 5 mg via INTRAVENOUS
  Filled 2023-04-05: qty 1

## 2023-04-05 MED ORDER — CHLORHEXIDINE GLUCONATE CLOTH 2 % EX PADS
6.0000 | MEDICATED_PAD | Freq: Every day | CUTANEOUS | Status: DC
  Administered 2023-04-05 – 2023-04-09 (×4): 6 via TOPICAL

## 2023-04-05 MED ORDER — ONDANSETRON HCL 4 MG/2ML IJ SOLN
4.0000 mg | Freq: Three times a day (TID) | INTRAMUSCULAR | Status: AC | PRN
  Administered 2023-04-05 – 2023-04-06 (×3): 4 mg via INTRAVENOUS
  Filled 2023-04-05 (×3): qty 2

## 2023-04-05 MED ORDER — LACTATED RINGERS IV BOLUS
1000.0000 mL | Freq: Once | INTRAVENOUS | Status: AC
  Administered 2023-04-05: 1000 mL via INTRAVENOUS

## 2023-04-05 MED ORDER — HALOPERIDOL LACTATE 5 MG/ML IJ SOLN: 5.0000 mg | Freq: Once | INTRAMUSCULAR | Status: AC

## 2023-04-05 MED ORDER — LACTATED RINGERS IV SOLN: INTRAVENOUS | Status: DC

## 2023-04-05 MED ORDER — SODIUM CHLORIDE 0.9 % IV SOLN: INTRAVENOUS | Status: DC | PRN

## 2023-04-05 MED ORDER — POTASSIUM CHLORIDE 10 MEQ/100ML IV SOLN
10.0000 meq | INTRAVENOUS | Status: AC
  Administered 2023-04-05 (×6): 10 meq via INTRAVENOUS
  Filled 2023-04-05 (×6): qty 100

## 2023-04-05 NOTE — Progress Notes (Signed)
eLink Physician-Brief Progress Note [name] DOB: 01/23/1996 MRN: [id number]   Date of Service  04/05/2023  HPI/Events of Note  Patient is more awake and requesting advancing diet  eICU Interventions  Regular diet     Intervention Category Minor Interventions: Routine modifications to care plan (e.g. PRN medications for pain, fever)  Sharon Ward 04/05/2023, 9:11 PM

## 2023-04-05 NOTE — Progress Notes (Signed)
[facility] ADULT ICU REPLACEMENT PROTOCOL   The patient does apply for the [facility] Adult ICU Electrolyte Replacment Protocol based on the criteria listed below:   1.Exclusion criteria: TCTS, ECMO, Dialysis, and Myasthenia Gravis patients 2. Is GFR >/= 30 ml/min? Yes.    Patient's GFR today is >60 3. Is SCr </= 2? Yes.   Patient's SCr is 0.78 mg/dL 4. Did SCr increase >/= 0.5 in 24 hours? No. 5.Pt's weight >40kg  Yes.   6. Abnormal electrolyte(s): K= 3.2  7. Electrolytes replaced per protocol 8.  Call MD STAT for K+ </= 2.5, Phos </= 1, or Mag </= 1 Physician:  Larinda Buttery [facility] 04/05/2023 4:59 AM

## 2023-04-05 NOTE — Progress Notes (Signed)
   04/05/23 2306  BiPAP/CPAP/SIPAP  Reason BIPAP/CPAP not in use Non-compliant   This writer placed bipap mask on patient.  Pt was unable to tolerate it.  Within seconds, pt stated it was "suffocating."  Mask removed per pt request and pt placed back on 2l [place].  HR75, rr21, spo2 99%, etco2 44.  RN aware.

## 2023-04-05 NOTE — Progress Notes (Signed)
NAME:  Sharon Ward, MRN:  [id number], DOB:  March 28, 1996, LOS: 1 ADMISSION DATE:  04/04/2023, CONSULTATION DATE:  04/04/23 REFERRING MD:  Estell Harpin, CHIEF COMPLAINT:  OD   History of Present Illness:  27 year old w/ hx of multiple psychosocial issues, MDD, recurrent suicide attempts presenting after intentional overdose of gabapentin and trazodone.  Poison control recs observation.  Not protecting airway in ER so intubated.  At my time of eval she is reaching for tube despite being on very high prop drip including a push.  Hx per chart review.   Pertinent  Medical History   Past Medical History:  Diagnosis Date   Asthma    Bipolar 1 disorder, mixed (HCC)    Depression    Generalized anxiety disorder    Intentional drug overdose (HCC) 11/03/2020   Low-lying placenta 01/07/2022   Resolved 03/04/22   Relationship dysfunction    Suicide attempt (HCC) 11/02/2020     Significant Hospital Events: Including procedures, antibiotic start and stop dates in addition to other pertinent events   8/26 admit after suicide attempt. Extubated.  8/27 still waking up. Ivf and replacing lytes   Interim History / Subjective:  Elevated etco2   Objective   Blood pressure (!) 116/51, pulse 88, temperature 98.4 F (36.9 C), temperature source Axillary, resp. rate 13, height 5\' 2"  (1.575 m), weight 88.8 kg, last menstrual period 03/16/2023, SpO2 97%, not currently breastfeeding.    Vent Mode: PRVC FiO2 (%):  [100 %] 100 % Set Rate:  [16 bmp] 16 bmp Vt Set:  [400 mL] 400 mL PEEP:  [5 cmH20] 5 cmH20 Plateau Pressure:  [12 cmH20] 12 cmH20   Intake/Output Summary (Last 24 hours) at 04/05/2023 0844 Last data filed at 04/05/2023 4098 Gross per 24 hour  Intake 1150.53 ml  Output 735 ml  Net 415.53 ml   Filed Weights   04/04/23 1725 04/05/23 0433  Weight: 93.2 kg 88.8 kg    Examination: General: wdwn young F  HENT: NCAT pink mm  Lungs: Symmetrical chest expansion, even unlabored, clear   Cardiovascular: rr cap refill < 3 sec  Abdomen: soft ndnt  Extremities: No acute joint deformity  Neuro: Does not follow commands. Moves spontaneously and yells following noxious stimuli.  GU: foley    Resolved Hospital Problem list   N/A  Assessment & Plan:   MDD, Bipolar 1, recurrent SI Suicide attempt -- gapapentin, trazodone  Toxic metabolic encephalopathy  Acute hypercarbic respiratory failure  Hypokalemia  THC use  P -Psych consult pending -maintain IVC -suicide precautions  -cont EtCO2 --if needed we can consider BiPAP, but think hypercarbia will improve as meds metabolize. When stimulated et is normal  -replace K  -follow qtc -give bolus LR, follow UOP  -cont foley through the morning, reassess in afternoon  -likely stable to transition to SDU vs progressive     Best Practice (right click and "Reselect all SmartList Selections" daily)   Diet/type: NPO DVT prophylaxis: prophylactic heparin  GI prophylaxis: N/A Lines: N/A Foley:  cont for now  Code Status:  full code Last date of multidisciplinary goals of care discussion [pending]  Labs   CBC: Recent Labs  Lab 04/04/23 1406 04/04/23 1414 04/05/23 0300  WBC  --  5.7 5.4  NEUTROABS  --  3.1  --   HGB 15.3* 14.6 12.4  HCT 45.0 44.8 39.6  MCV  --  89.6 90.8  PLT  --  323 224    Basic Metabolic Panel: Recent Labs  Lab 04/04/23 1406 04/04/23 1414 04/05/23 0300  NA 140 139 140  K 5.0 4.9 3.2*  CL 104 104 104  CO2  --  27 24  GLUCOSE 95 98 121*  BUN 16 16 15   CREATININE 0.90 0.83 0.78  CALCIUM  --  9.7 8.9  MG  --   --  2.2   GFR: Estimated Creatinine Clearance: 111.3 mL/min (by C-G formula based on SCr of 0.78 mg/dL). Recent Labs  Lab 04/04/23 1414 04/05/23 0300  WBC 5.7 5.4    Liver Function Tests: Recent Labs  Lab 04/04/23 1414 04/05/23 0300  AST 20 19  ALT 22 19  ALKPHOS 58 47  BILITOT 0.4 0.3  PROT 8.2* 6.9  ALBUMIN 4.7 3.9   No results for input(s): "LIPASE",  "AMYLASE" in the last 168 hours. No results for input(s): "AMMONIA" in the last 168 hours.  ABG    Component Value Date/Time   TCO2 26 04/04/2023 1406     Coagulation Profile: No results for input(s): "INR", "PROTIME" in the last 168 hours.  Cardiac Enzymes: No results for input(s): "CKTOTAL", "CKMB", "CKMBINDEX", "TROPONINI" in the last 168 hours.  HbA1C: Hgb A1c MFr Bld  Date/Time Value Ref Range Status  03/20/2023 06:55 AM 5.2 4.8 - 5.6 % Final    Comment:    (NOTE)         Prediabetes: 5.7 - 6.4         Diabetes: >6.4         Glycemic control for adults with diabetes: <7.0   10/19/2022 06:30 AM 5.2 4.8 - 5.6 % Final    Comment:    (NOTE)         Prediabetes: 5.7 - 6.4         Diabetes: >6.4         Glycemic control for adults with diabetes: <7.0     CBG: Recent Labs  Lab 04/04/23 1402 04/04/23 1941  GLUCAP 104* 89    CRITICAL CARE Performed by: Lanier Clam   N/a  Tessie Fass MSN, AGACNP-BC [facility] Amion for pager 04/05/2023, 8:44 AM

## 2023-04-05 NOTE — Progress Notes (Signed)
Spiritual Care will continue to follow as needs arise.  Sharon Ward is known to chaplain from inpatient behavioral Health. Please let us know if status changes or if pt requests support.    [address] Bcc Pager, [phone]

## 2023-04-05 NOTE — Progress Notes (Addendum)
   04/05/23 2043  BiPAP/CPAP/SIPAP  Reason BIPAP/CPAP not in use Non-compliant   Pt seen, resting but is arousable.  HR69, rr14, spo2 98% ON 2L nasal cannula.  ETCO2=54.  Pt does not want to wear bipap at this time.  Pt stated she will wear it later,  RN notified.  Bipap remains in room on standby.

## 2023-04-06 DIAGNOSIS — T1491XA Suicide attempt, initial encounter: Secondary | ICD-10-CM | POA: Diagnosis not present

## 2023-04-06 DIAGNOSIS — T50902A Poisoning by unspecified drugs, medicaments and biological substances, intentional self-harm, initial encounter: Secondary | ICD-10-CM | POA: Diagnosis not present

## 2023-04-06 LAB — BASIC METABOLIC PANEL
Anion gap: 5 (ref 5–15)
BUN: 10 mg/dL (ref 6–20)
CO2: 25 mmol/L (ref 22–32)
Calcium: 8.6 mg/dL — ABNORMAL LOW (ref 8.9–10.3)
Chloride: 107 mmol/L (ref 98–111)
Creatinine, Ser: 0.74 mg/dL (ref 0.44–1.00)
GFR, Estimated: >60 mL/min (ref >60)
Glucose, Bld: 99 mg/dL (ref 70–99)
Potassium: 3.6 mmol/L (ref 3.5–5.1)
Sodium: 137 mmol/L (ref 135–145)

## 2023-04-06 MED ORDER — ARIPIPRAZOLE 5 MG PO TABS
5.0000 mg | ORAL_TABLET | Freq: Once | ORAL | Status: AC
  Administered 2023-04-07: 5 mg via ORAL
  Filled 2023-04-06 (×2): qty 1

## 2023-04-06 MED ORDER — KETOROLAC TROMETHAMINE 30 MG/ML IJ SOLN
30.0000 mg | Freq: Four times a day (QID) | INTRAMUSCULAR | Status: AC | PRN
  Administered 2023-04-06 – 2023-04-07 (×2): 30 mg via INTRAVENOUS
  Filled 2023-04-06 (×2): qty 1

## 2023-04-06 MED ORDER — POTASSIUM CHLORIDE CRYS ER 20 MEQ PO TBCR
40.0000 meq | EXTENDED_RELEASE_TABLET | Freq: Once | ORAL | Status: AC
  Administered 2023-04-06: 40 meq via ORAL
  Filled 2023-04-06: qty 2

## 2023-04-06 NOTE — Progress Notes (Signed)
[facility] ADULT ICU REPLACEMENT PROTOCOL   The patient does apply for the [facility] Adult ICU Electrolyte Replacment Protocol based on the criteria listed below:   1.Exclusion criteria: TCTS, ECMO, Dialysis, and Myasthenia Gravis patients 2. Is GFR >/= 30 ml/min? Yes.    Patient's GFR today is >60 3. Is SCr </= 2? Yes.   Patient's SCr is 0.74 mg/dL 4. Did SCr increase >/= 0.5 in 24 hours? No. 5.Pt's weight >40kg  Yes.   6. Abnormal electrolyte(s): potassium 3.6  7. Electrolytes replaced per protocol 8.  Call MD STAT for K+ </= 2.5, Phos </= 1, or Mag </= 1 Physician:  protocol  Melvern Banker 04/06/2023 4:38 AM
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
## 2023-04-06 NOTE — Plan of Care (Signed)
  Problem: Education: Goal: Knowledge of General Education information will improve Description: Including pain rating scale, medication(s)/side effects and non-pharmacologic comfort measures Outcome: Not Progressing   Problem: Health Behavior/Discharge Planning: Goal: Ability to manage health-related needs will improve Outcome: Not Progressing   Problem: Nutrition: Goal: Adequate nutrition will be maintained Outcome: Not Progressing   Problem: Coping: Goal: Level of anxiety will decrease Outcome: Not Progressing   Problem: Education: Goal: Knowledge of warning signs, risks, and behaviors that relate to suicide ideation and self-harm behaviors will improve Outcome: Not Progressing

## 2023-04-06 NOTE — Care Management (Signed)
Transition of Care [facility]) - Inpatient Brief Assessment   Patient Details  Name: Sharon Ward MRN: [id number] Date of Birth: 03-10-1996  Transition of Care [facility]) CM/SW Contact:    Lavenia Atlas, RN Phone Number: 04/06/2023, 6:52 PM   Clinical Narrative: Per chart review patient currently in [facility] SDU for suicide attempt. IVC on file initiated on 04/04/23. Patient does not have PCP on file.  TOC will continue to follow for needs.   Transition of Care Asessment: Insurance and Status: Insurance coverage has been reviewed Patient has primary care physician: No Home environment has been reviewed: home alone Prior level of function:: independent Prior/Current Home Services: No current home services Social Determinants of Health Reivew: SDOH reviewed no interventions necessary Readmission risk has been reviewed: Yes Transition of care needs: transition of care needs identified, TOC will continue to follow

## 2023-04-06 NOTE — Progress Notes (Signed)
  Progress Note   Patient: Sharon Ward ZOX:[id number] DOB: Nov 14, 1995 DOA: 04/04/2023     2 DOS: the patient was seen and examined on 04/06/2023   Brief hospital course: 27yo with hx depression ,past suicide attempts presented with intentional neurontin and trazadone OD, was initially intubated for airway protection, later extubated. Psych consulted   Assessment and Plan: Intentional suicide attempt with depression -holding further neurontin and trazadone -Psych consulted, f/u on recs -cont suicide precautions -recheck bmet in AM  Toxic metabolic encephalopathy -conversing appropriately this AM -cont supportive care  Hypercarbic respiratory failure -now extubated per PCCM -On minimal o2 currently      Subjective: Complains of nausea this AM and generalized weakness  Physical Exam: Vitals:   04/06/23 0525 04/06/23 0700 04/06/23 0754 04/06/23 0800  BP: 120/72   101/65  Pulse: 60 72 75 (!) 57  Resp: 15 (!) 21 19 13   Temp: 97.8 F (36.6 C)  97.8 F (36.6 C)   TempSrc: Oral  Oral   SpO2: 98% 97% 98% 94%  Weight: 89 kg     Height:       General exam: Awake, laying in bed, in nad Respiratory system: Normal respiratory effort, no wheezing Cardiovascular system: regular rate, s1, s2 Gastrointestinal system: Soft, nondistended, positive BS Central nervous system: CN2-12 grossly intact, strength intact Extremities: Perfused, no clubbing Skin: Normal skin turgor, no notable skin lesions seen Psychiatry: Mood normal // no visual hallucinations   Data Reviewed:  Labs reviewed: Na 137, K 3.6, Cr 0.74  Family Communication: Pt in room, family not at bedside  Disposition: Status is: Inpatient Remains inpatient appropriate because: severity of illness  Planned Discharge Destination:  Pending Psych eval     Author: Rickey Barbara, MD 04/06/2023 10:52 AM  For on call review [email or web address].

## 2023-04-06 NOTE — Progress Notes (Signed)
Bipap on standby. Pt on Nasal cannula at this time

## 2023-04-06 NOTE — Progress Notes (Signed)
Pt is awake, alert, talking without distress.  HR73, RR17, spo2 98% on room air.  Bipap in room on standby but not indicated at this time.

## 2023-04-07 DIAGNOSIS — F332 Major depressive disorder, recurrent severe without psychotic features: Secondary | ICD-10-CM | POA: Diagnosis not present

## 2023-04-07 DIAGNOSIS — T50902A Poisoning by unspecified drugs, medicaments and biological substances, intentional self-harm, initial encounter: Secondary | ICD-10-CM | POA: Diagnosis not present

## 2023-04-07 LAB — COMPREHENSIVE METABOLIC PANEL
ALT: 16 U/L (ref 0–44)
AST: 16 U/L (ref 15–41)
Albumin: 3.7 g/dL (ref 3.5–5.0)
Alkaline Phosphatase: 56 U/L (ref 38–126)
Anion gap: 7 (ref 5–15)
BUN: 11 mg/dL (ref 6–20)
CO2: 26 mmol/L (ref 22–32)
Calcium: 8.7 mg/dL — ABNORMAL LOW (ref 8.9–10.3)
Chloride: 105 mmol/L (ref 98–111)
Creatinine, Ser: 0.7 mg/dL (ref 0.44–1.00)
GFR, Estimated: >60 mL/min (ref >60)
Glucose, Bld: 110 mg/dL — ABNORMAL HIGH (ref 70–99)
Potassium: 3.5 mmol/L (ref 3.5–5.1)
Sodium: 138 mmol/L (ref 135–145)
Total Bilirubin: 0.3 mg/dL (ref 0.3–1.2)
Total Protein: 6.8 g/dL (ref 6.5–8.1)

## 2023-04-07 LAB — CBC
HCT: 36.4 % (ref 36.0–46.0)
Hemoglobin: 11.6 g/dL — ABNORMAL LOW (ref 12.0–15.0)
MCH: 28.6 pg (ref 26.0–34.0)
MCHC: 31.9 g/dL (ref 30.0–36.0)
MCV: 89.7 fL (ref 80.0–100.0)
Platelets: 240 10*3/uL (ref 150–400)
RBC: 4.06 MIL/uL (ref 3.87–5.11)
RDW: 13.5 % (ref 11.5–15.5)
WBC: 5.6 10*3/uL (ref 4.0–10.5)
nRBC: 0 % (ref 0.0–0.2)

## 2023-04-07 MED ORDER — ARIPIPRAZOLE 10 MG PO TABS: 10.0000 mg | ORAL_TABLET | Freq: Once | ORAL | Status: DC

## 2023-04-07 MED ORDER — HALOPERIDOL LACTATE 5 MG/ML IJ SOLN
INTRAMUSCULAR | Status: AC
  Administered 2023-04-07: 2 mg via INTRAVENOUS
  Filled 2023-04-07: qty 1

## 2023-04-07 MED ORDER — ONDANSETRON HCL 4 MG/2ML IJ SOLN
4.0000 mg | Freq: Three times a day (TID) | INTRAMUSCULAR | Status: DC | PRN
  Administered 2023-04-07: 4 mg via INTRAVENOUS
  Filled 2023-04-07 (×2): qty 2

## 2023-04-07 MED ORDER — ORAL CARE MOUTH RINSE: 15.0000 mL | OROMUCOSAL | Status: DC | PRN

## 2023-04-07 MED ORDER — METOCLOPRAMIDE HCL 5 MG/ML IJ SOLN
5.0000 mg | Freq: Four times a day (QID) | INTRAMUSCULAR | Status: DC | PRN
  Administered 2023-04-07: 5 mg via INTRAVENOUS
  Filled 2023-04-07: qty 2

## 2023-04-07 MED ORDER — HALOPERIDOL LACTATE 5 MG/ML IJ SOLN
2.0000 mg | Freq: Four times a day (QID) | INTRAMUSCULAR | Status: DC | PRN
  Filled 2023-04-07: qty 1
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
## 2023-04-07 NOTE — Progress Notes (Signed)
PROGRESS NOTE    Sharon Ward  IRJ:[id number] DOB: Jul 14, 1996 DOA: 04/04/2023 PCP: System, Provider Not In   Brief Narrative:    27yo with hx depression ,past suicide attempts presented with intentional neurontin and trazadone OD, was initially intubated for airway protection, later extubated. Psych consulted and is evaluating for inpatient psychiatric stay.  She currently has intractable nausea and vomiting and is under IVC.  Assessment & Plan:   Active Problems:   Suicide attempt (HCC)   Toxic metabolic encephalopathy   Acute respiratory failure with hypercapnia (HCC)  Assessment and Plan:  Intentional suicide attempt with depression -holding further neurontin and trazadone -Psych consulted, f/u on recs with consideration for inpatient psych pending -cont suicide precautions -recheck bmet in AM  Intractable nausea and vomiting -Denies abdominal pain or hematemesis -Zofran does not appear to be helping, add Reglan as needed due to issues with QT prolongation -Continue to monitor -IV fluid for hydration   Toxic metabolic encephalopathy -conversing appropriately this AM -cont supportive care   Hypercarbic respiratory failure -now extubated per PCCM -On minimal o2 currently  Obesity -BMI 37.3   DVT prophylaxis:Heparin Code Status: Full Family Communication: None at bedside, sitter at bedside Disposition Plan:  Status is: Inpatient Remains inpatient appropriate because: Need for IV medications   Consultants:  Psychiatry  Procedures:  None  Antimicrobials:  None   Subjective: Patient seen and evaluated today with complaints of ongoing nausea and vomiting overnight.  Objective: Vitals:   04/07/23 0400 04/07/23 0500 04/07/23 0600 04/07/23 0758  BP: 125/86  126/69   Pulse: (!) 59 68 (!) 45   Resp: 20 13 12    Temp:    97.6 F (36.4 C)  TempSrc:    Oral  SpO2: 96% 100% 100%   Weight:  92.5 kg    Height:        Intake/Output Summary (Last 24  hours) at 04/07/2023 1054 Last data filed at 04/07/2023 0521 Gross per 24 hour  Intake --  Output 1250 ml  Net -1250 ml   Filed Weights   04/05/23 0433 04/06/23 0525 04/07/23 0500  Weight: 88.8 kg 89 kg 92.5 kg    Examination:  General exam: Appears calm and comfortable  Respiratory system: Clear to auscultation. Respiratory effort normal. Cardiovascular system: S1 & S2 heard, RRR.  Gastrointestinal system: Abdomen is soft Central nervous system: Alert and awake Extremities: No edema Skin: No significant lesions noted Psychiatry: Flat affect.    Data Reviewed: I have personally reviewed following labs and imaging studies  CBC: Recent Labs  Lab 04/04/23 1406 04/04/23 1414 04/05/23 0300 04/07/23 0300  WBC  --  5.7 5.4 5.6  NEUTROABS  --  3.1  --   --   HGB 15.3* 14.6 12.4 11.6*  HCT 45.0 44.8 39.6 36.4  MCV  --  89.6 90.8 89.7  PLT  --  323 224 240   Basic Metabolic Panel: Recent Labs  Lab 04/04/23 1406 04/04/23 1414 04/05/23 0300 04/06/23 0306 04/07/23 0300  NA 140 139 140 137 138  K 5.0 4.9 3.2* 3.6 3.5  CL 104 104 104 107 105  CO2  --  27 24 25 26   GLUCOSE 95 98 121* 99 110*  BUN 16 16 15 10 11   CREATININE 0.90 0.83 0.78 0.74 0.70  CALCIUM  --  9.7 8.9 8.6* 8.7*  MG  --   --  2.2  --   --    GFR: Estimated Creatinine Clearance: 113.9 mL/min (by C-G formula based  on SCr of 0.7 mg/dL). Liver Function Tests: Recent Labs  Lab 04/04/23 1414 04/05/23 0300 04/07/23 0300  AST 20 19 16   ALT 22 19 16   ALKPHOS 58 47 56  BILITOT 0.4 0.3 0.3  PROT 8.2* 6.9 6.8  ALBUMIN 4.7 3.9 3.7   No results for input(s): "LIPASE", "AMYLASE" in the last 168 hours. No results for input(s): "AMMONIA" in the last 168 hours. Coagulation Profile: No results for input(s): "INR", "PROTIME" in the last 168 hours. Cardiac Enzymes: No results for input(s): "CKTOTAL", "CKMB", "CKMBINDEX", "TROPONINI" in the last 168 hours. BNP (last 3 results) No results for input(s):  "PROBNP" in the last 8760 hours. HbA1C: No results for input(s): "HGBA1C" in the last 72 hours. CBG: Recent Labs  Lab 04/04/23 1402 04/04/23 1941 04/05/23 1205  GLUCAP 104* 89 103*   Lipid Profile: No results for input(s): "CHOL", "HDL", "LDLCALC", "TRIG", "CHOLHDL", "LDLDIRECT" in the last 72 hours. Thyroid Function Tests: No results for input(s): "TSH", "T4TOTAL", "FREET4", "T3FREE", "THYROIDAB" in the last 72 hours. Anemia Panel: No results for input(s): "VITAMINB12", "FOLATE", "FERRITIN", "TIBC", "IRON", "RETICCTPCT" in the last 72 hours. Sepsis Labs: No results for input(s): "PROCALCITON", "LATICACIDVEN" in the last 168 hours.  No results found for this or any previous visit (from the past 240 hour(s)).       Radiology Studies: No results found.      Scheduled Meds:  Chlorhexidine Gluconate Cloth  6 each Topical Daily   heparin  5,000 Units Subcutaneous Q8H   Continuous Infusions:  sodium chloride     lactated ringers Stopped (04/05/23 1719)     LOS: 3 days    Time spent: 35 minutes    Rashaan Wyles Hoover Brunette, DO [facility]  If 7PM-7AM, please contact night-coverage [email or web address] 04/07/2023, 10:54 AM

## 2023-04-07 NOTE — Plan of Care (Signed)
  Problem: Elimination: Goal: Will not experience complications related to urinary retention Outcome: Progressing   Problem: Health Behavior/Discharge Planning: Goal: Ability to manage health-related needs will improve Outcome: Not Progressing   Problem: Nutrition: Goal: Adequate nutrition will be maintained Outcome: Not Progressing   Problem: Coping: Goal: Level of anxiety will decrease Outcome: Not Progressing   Problem: Education: Goal: Knowledge of warning signs, risks, and behaviors that relate to suicide ideation and self-harm behaviors will improve Outcome: Not Progressing

## 2023-04-08 DIAGNOSIS — T1491XA Suicide attempt, initial encounter: Secondary | ICD-10-CM | POA: Diagnosis not present

## 2023-04-08 DIAGNOSIS — F332 Major depressive disorder, recurrent severe without psychotic features: Secondary | ICD-10-CM | POA: Diagnosis not present

## 2023-04-08 LAB — CBC
HCT: 35.3 % — ABNORMAL LOW (ref 36.0–46.0)
Hemoglobin: 11.4 g/dL — ABNORMAL LOW (ref 12.0–15.0)
MCH: 28.8 pg (ref 26.0–34.0)
MCHC: 32.3 g/dL (ref 30.0–36.0)
MCV: 89.1 fL (ref 80.0–100.0)
Platelets: 218 10*3/uL (ref 150–400)
RBC: 3.96 MIL/uL (ref 3.87–5.11)
RDW: 13.5 % (ref 11.5–15.5)
WBC: 5 10*3/uL (ref 4.0–10.5)
nRBC: 0 % (ref 0.0–0.2)

## 2023-04-08 LAB — COMPREHENSIVE METABOLIC PANEL
ALT: 15 U/L (ref 0–44)
AST: 13 U/L — ABNORMAL LOW (ref 15–41)
Albumin: 3.6 g/dL (ref 3.5–5.0)
Alkaline Phosphatase: 48 U/L (ref 38–126)
Anion gap: 8 (ref 5–15)
BUN: 9 mg/dL (ref 6–20)
CO2: 23 mmol/L (ref 22–32)
Calcium: 9 mg/dL (ref 8.9–10.3)
Chloride: 107 mmol/L (ref 98–111)
Creatinine, Ser: 0.71 mg/dL (ref 0.44–1.00)
GFR, Estimated: >60 mL/min (ref >60)
Glucose, Bld: 91 mg/dL (ref 70–99)
Potassium: 3.4 mmol/L — ABNORMAL LOW (ref 3.5–5.1)
Sodium: 138 mmol/L (ref 135–145)
Total Bilirubin: 0.4 mg/dL (ref 0.3–1.2)
Total Protein: 6.5 g/dL (ref 6.5–8.1)

## 2023-04-08 LAB — MAGNESIUM: Magnesium: 2.1 mg/dL (ref 1.7–2.4)

## 2023-04-08 MED ORDER — POTASSIUM CHLORIDE CRYS ER 20 MEQ PO TBCR
40.0000 meq | EXTENDED_RELEASE_TABLET | ORAL | Status: DC
  Administered 2023-04-08: 40 meq via ORAL
  Filled 2023-04-08: qty 2

## 2023-04-08 MED ORDER — POTASSIUM CHLORIDE 20 MEQ PO PACK
40.0000 meq | PACK | Freq: Once | ORAL | Status: AC
  Administered 2023-04-08: 40 meq via ORAL
  Filled 2023-04-08: qty 2
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
## 2023-04-08 NOTE — Progress Notes (Signed)
Report given to [place], RN on [address]. Pt to be wheeled to room 1401.

## 2023-04-08 NOTE — Progress Notes (Signed)
Pt has been very cooperative today, has given staff no trouble. Only requested to make 1 phone call to grandmother. Will continue to monitor.

## 2023-04-08 NOTE — Progress Notes (Signed)
[facility] Progress Note  Patient: Sharon Ward     WUJ:[id number]  DOA: 04/04/2023   PCP: System, Provider Not In       Brief hospital course: This is a 27 year old female with depression and past suicide attempts who presented after a suicide attempt with gabapentin and trazodone.  She was intubated and admitted to the ICU.  She was treated with Haldol for agitation.  Extubated on 8/27 and transferred to [facility] service on 8/28.  Subjective:  She feels a little nauseated today.  She is able to drink liquids and will make attempts to eat solid food. Assessment and Plan: Principal Problem:   Suicide attempt     Major depressive disorder, recurrent severe without psychotic features (HCC)   PTSD (post-traumatic stress disorder)   GAD (generalized anxiety disorder) -Appreciate psychiatry evaluation -She will need inpatient admission to behavioral health when medically stable-currently has very poor oral intake -Programmer, multimedia -The patient has an IVC  Active Problems:     Toxic metabolic encephalopathy   Acute respiratory failure with hypercapnia  -Required intubation - Appears to be back at baseline now -Transferring out of ICU  Asymptomatic bradycardia -Rate in the 40s and 50s- she has been ambulating and has no complaints - Continue to follow on telemetry - Follow-up EKG in a.m.   Nausea and vomiting -Secondary to overdose - Appears to be improving - Will need to continue LR at 75 cc an hour until she is eating and drinking appropriately  Hypokalemia - Replaced-recheck tomorrow    Code Status: Full Code Total time on patient care: 35 minutes DVT prophylaxis:  heparin injection 5,000 Units Start: 04/04/23 1600 SCDs Start: 04/04/23 1549     Objective:   Vitals:   04/08/23 1124 04/08/23 1200 04/08/23 1314 04/08/23 1520  BP:   120/71 109/61  Pulse:  (!) 39 62 (!) 53  Resp:  17 13 13   Temp: 98 F (36.7 C)   98.2 F (36.8 C)   TempSrc: Oral   Oral  SpO2:  99% 99% 100%  Weight:      Height:       Filed Weights   04/06/23 0525 04/07/23 0500 04/08/23 0700  Weight: 89 kg 92.5 kg 93.2 kg   Exam: General exam: Appears comfortable  HEENT: oral mucosa moist Respiratory system: Clear to auscultation.  Cardiovascular system: S1 & S2 heard  Gastrointestinal system: Abdomen soft, non-tender, nondistended. Normal bowel sounds   Extremities: No cyanosis, clubbing or edema Psychiatry:  Mood & affect appropriate.      CBC: Recent Labs  Lab 04/04/23 1406 04/04/23 1414 04/05/23 0300 04/07/23 0300 04/08/23 0303  WBC  --  5.7 5.4 5.6 5.0  NEUTROABS  --  3.1  --   --   --   HGB 15.3* 14.6 12.4 11.6* 11.4*  HCT 45.0 44.8 39.6 36.4 35.3*  MCV  --  89.6 90.8 89.7 89.1  PLT  --  323 224 240 218   Basic Metabolic Panel: Recent Labs  Lab 04/04/23 1414 04/05/23 0300 04/06/23 0306 04/07/23 0300 04/08/23 0303  NA 139 140 137 138 138  K 4.9 3.2* 3.6 3.5 3.4*  CL 104 104 107 105 107  CO2 27 24 25 26 23   GLUCOSE 98 121* 99 110* 91  BUN 16 15 10 11 9   CREATININE 0.83 0.78 0.74 0.70 0.71  CALCIUM 9.7 8.9 8.6* 8.7* 9.0  MG  --  2.2  --   --  2.1  Scheduled Meds:  ARIPiprazole  10 mg Oral Once   Chlorhexidine Gluconate Cloth  6 each Topical Daily   heparin  5,000 Units Subcutaneous Q8H   Continuous Infusions:  sodium chloride     lactated ringers 75 mL/hr at 04/08/23 1357   Imaging and lab data was personally reviewed   Author: Ladell Heads Sharaya Boruff  04/08/2023 4:58 PM  To contact [facility]>   Check the care team in [facility] and look for the attending/consulting [facility] provider listed  Log into [email or web address] and use 's universal password   Go to> "[facility]"  and find provider  If you still have difficulty reaching the provider, please page the [facility] (Director on Call) for the Hospitalists listed on amion

## 2023-04-09 DIAGNOSIS — T1491XA Suicide attempt, initial encounter: Secondary | ICD-10-CM | POA: Diagnosis not present

## 2023-04-09 LAB — PREGNANCY, URINE: Preg Test, Ur: NEGATIVE

## 2023-04-09 NOTE — Plan of Care (Signed)
  Problem: Clinical Measurements: Goal: Cardiovascular complication will be avoided Outcome: Progressing   Problem: Nutrition: Goal: Adequate nutrition will be maintained Outcome: Progressing   Problem: Safety: Goal: Ability to remain free from injury will improve Outcome: Progressing   Problem: Coping: Goal: Ability to disclose and discuss thoughts of suicide and self-harm will improve Outcome: Progressing   Problem: Self Esteem: Goal: Ability to verbalize positive feeling about self will improve Outcome: Progressing   Problem: Education: Goal: Knowledge of General Education information will improve Description: Including pain rating scale, medication(s)/side effects and non-pharmacologic comfort measures Outcome: Adequate for Discharge   Problem: Clinical Measurements: Goal: Will remain free from infection Outcome: Adequate for Discharge Goal: Respiratory complications will improve Outcome: Adequate for Discharge   Problem: Activity: Goal: Risk for activity intolerance will decrease Outcome: Adequate for Discharge   Problem: Elimination: Goal: Will not experience complications related to bowel motility Outcome: Adequate for Discharge   Problem: Pain Managment: Goal: General experience of comfort will improve Outcome: Adequate for Discharge   Problem: Skin Integrity: Goal: Risk for impaired skin integrity will decrease Outcome: Adequate for Discharge   Problem: Nutrition: Goal: Adequate fluids and nutrition will be maintained Outcome: Adequate for Discharge

## 2023-04-09 NOTE — Progress Notes (Signed)
PROGRESS NOTE    Brendaliz Lavan  ZOX:[id number]  DOB: Jan 11, 1996  DOA: 04/04/2023 PCP: System, Provider Not In Outpatient Specialists:   Hospital course:  27 year old female with depression and multiple past suicide attempts was admitted after intentional overdose with gabapentin and trazodone.  She was initially intubated for airway protection, extubated the following day and transferred to medical floor where she has had intermittent nausea and vomiting.  Subjective:  Patient states that she is feeling better with significantly decreased vomiting.  She still is nauseated however notes that she ate [place] toast for breakfast, 2 sandwiches for lunch and is not having any vomiting.  Patient notes the last time she felt this nauseated was when she was pregnant however assures me that there is no way she could be pregnant.   Objective: Vitals:   04/08/23 1927 04/09/23 0617 04/09/23 1200 04/09/23 1315  BP: 121/69 125/67 103/60 117/79  Pulse: (!) 51 (!) 46 (!) 55 60  Resp: 20 18    Temp: 98 F (36.7 C) 97.8 F (36.6 C) 98.5 F (36.9 C)   TempSrc: Oral Oral Axillary   SpO2: 100% 97% 100% 100%  Weight:  92.2 kg    Height:        Intake/Output Summary (Last 24 hours) at 04/09/2023 1608 Last data filed at 04/09/2023 1500 Gross per 24 hour  Intake 240 ml  Output --  Net 240 ml   Filed Weights   04/07/23 0500 04/08/23 0700 04/09/23 0617  Weight: 92.5 kg 93.2 kg 92.2 kg     Exam:  General: Friendly female in good spirits lying in bed in NAD Eyes: sclera anicteric, conjuctiva mild injection bilaterally CVS: S1-S2, regular  Respiratory:  decreased air entry bilaterally secondary to decreased inspiratory effort, rales at bases  GI: NABS, soft, NT  LE: Warm and well-perfused Neuro: A/O x 3,  grossly nonfocal.    Data Reviewed:  Basic Metabolic Panel: Recent Labs  Lab 04/04/23 1414 04/05/23 0300 04/06/23 0306 04/07/23 0300 04/08/23 0303  NA 139 140 137 138 138  K  4.9 3.2* 3.6 3.5 3.4*  CL 104 104 107 105 107  CO2 27 24 25 26 23   GLUCOSE 98 121* 99 110* 91  BUN 16 15 10 11 9   CREATININE 0.83 0.78 0.74 0.70 0.71  CALCIUM 9.7 8.9 8.6* 8.7* 9.0  MG  --  2.2  --   --  2.1    CBC: Recent Labs  Lab 04/04/23 1406 04/04/23 1414 04/05/23 0300 04/07/23 0300 04/08/23 0303  WBC  --  5.7 5.4 5.6 5.0  NEUTROABS  --  3.1  --   --   --   HGB 15.3* 14.6 12.4 11.6* 11.4*  HCT 45.0 44.8 39.6 36.4 35.3*  MCV  --  89.6 90.8 89.7 89.1  PLT  --  323 224 240 218     Scheduled Meds:  ARIPiprazole  10 mg Oral Once   Chlorhexidine Gluconate Cloth  6 each Topical Daily   heparin  5,000 Units Subcutaneous Q8H   Continuous Infusions:  sodium chloride     lactated ringers 75 mL/hr at 04/08/23 1357     Assessment & Plan:   MDD Suicide attempt with trazodone and gabapentin Patient with persistent bradycardia but appears to be improving QTc was 468 today, WNL  Hypokalemia Recheck in the morning to make sure it was adequately repleted  Nausea and vomiting Vomiting has resolved Patient has persistent nausea but is eating very well Patient is on  LR at 75, if BMP tomorrow is normal would discontinue as she is eating well  Metabolic encephalopathy Resolved   DVT prophylaxis: Subcu heparin Code Status: Full Family Communication: None today       Studies: No results found.  Principal Problem:   Suicide attempt [facility]) Active Problems:   Major depressive disorder, recurrent severe without psychotic features (HCC)   PTSD (post-traumatic stress disorder)   GAD (generalized anxiety disorder)   Toxic metabolic encephalopathy   Acute respiratory failure with hypercapnia (HCC)     Hosie Sharman Orma Flaming, [facility]  If 7PM-7AM, please contact night-coverage [email or web address]   LOS: 5 days

## 2023-04-09 NOTE — Progress Notes (Signed)
The pt refunded her heparin sub q shot. States that she hates needles and does not want to get suck. She also states that she will refuse her am labs. Educated her on the importance of her heparin and labs, she verbalized understanding but is still refusing at this time. Plan of care on going.

## 2023-04-10 DIAGNOSIS — T1491XA Suicide attempt, initial encounter: Secondary | ICD-10-CM | POA: Diagnosis not present

## 2023-04-10 LAB — BASIC METABOLIC PANEL
Anion gap: 8 (ref 5–15)
BUN: 9 mg/dL (ref 6–20)
CO2: 26 mmol/L (ref 22–32)
Calcium: 9.4 mg/dL (ref 8.9–10.3)
Chloride: 103 mmol/L (ref 98–111)
Creatinine, Ser: 0.75 mg/dL (ref 0.44–1.00)
GFR, Estimated: >60 mL/min (ref >60)
Glucose, Bld: 102 mg/dL — ABNORMAL HIGH (ref 70–99)
Potassium: 3.8 mmol/L (ref 3.5–5.1)
Sodium: 137 mmol/L (ref 135–145)

## 2023-04-10 NOTE — Progress Notes (Signed)
PROGRESS NOTE    Sharon Ward  [id number]:[id number]  DOB: 1996/07/06  DOA: 04/04/2023 PCP: System, Provider Not In Outpatient Specialists:   Hospital course:  27 year old female with depression and multiple past suicide attempts was admitted after intentional overdose with gabapentin and trazodone.  She was initially intubated for airway protection, extubated the following day and transferred to medical floor where she has had intermittent nausea and vomiting.  Subjective:  Patient states she is doing good.  Notes that her appetite is back and she basically feels normal.  Per RN, patient is wanting to call her significant other to find out about her kids.  She also wants to take a shower.   Objective: Vitals:   04/10/23 0554 04/10/23 0808 04/10/23 0914 04/10/23 1325  BP: (!) 119/58  (!) 118/43 119/68  Pulse:   65 (!) 56  Resp: 16 15    Temp: 98.1 F (36.7 C)  97.8 F (36.6 C) 98.1 F (36.7 C)  TempSrc: Oral  Oral Oral  SpO2: 97%   100%  Weight:      Height:       No intake or output data in the 24 hours ending 04/10/23 1626  Filed Weights   04/07/23 0500 04/08/23 0700 04/09/23 0617  Weight: 92.5 kg 93.2 kg 92.2 kg     Exam:  General: Friendly female in good spirits lying in bed in NAD Eyes: sclera anicteric, conjuctiva mild injection bilaterally CVS: S1-S2, regular  Respiratory:  decreased air entry bilaterally secondary to decreased inspiratory effort, rales at bases  GI: NABS, soft, NT  LE: Warm and well-perfused Neuro: A/O x 3,  grossly nonfocal.    Data Reviewed:  Basic Metabolic Panel: Recent Labs  Lab 04/05/23 0300 04/06/23 0306 04/07/23 0300 04/08/23 0303 04/10/23 0906  NA 140 137 138 138 137  K 3.2* 3.6 3.5 3.4* 3.8  CL 104 107 105 107 103  CO2 24 25 26 23 26   GLUCOSE 121* 99 110* 91 102*  BUN 15 10 11 9 9   CREATININE 0.78 0.74 0.70 0.71 0.75  CALCIUM 8.9 8.6* 8.7* 9.0 9.4  MG 2.2  --   --  2.1  --     CBC: Recent Labs  Lab  04/04/23 1406 04/04/23 1414 04/05/23 0300 04/07/23 0300 04/08/23 0303  WBC  --  5.7 5.4 5.6 5.0  NEUTROABS  --  3.1  --   --   --   HGB 15.3* 14.6 12.4 11.6* 11.4*  HCT 45.0 44.8 39.6 36.4 35.3*  MCV  --  89.6 90.8 89.7 89.1  PLT  --  323 224 240 218     Scheduled Meds:  ARIPiprazole  10 mg Oral Once   Chlorhexidine Gluconate Cloth  6 each Topical Daily   heparin  5,000 Units Subcutaneous Q8H   Continuous Infusions:  sodium chloride     lactated ringers 75 mL/hr at 04/08/23 1357     Assessment & Plan:   MDD Suicide attempt with trazodone and gabapentin Patient was IVC by psychiatry, IVC will need to be renewed on 9/3 Spoke with on-call psychiatrist who noted she was not on the sign that she and he recommended that I place an inpatient psychiatry consult for them to review, inpatient psychiatry consult placed. In addition, I declined permission for patient to talk to her as significant other as previous psychiatry notes mentioned that he was apparently urging her to kill herself.  Psychiatry should determine whether or not she is safe to speak  with him.  Bradycardia Is persistent but not clinically significant I reviewed her chart and she used to have normal heart rates in the 80-100 last year and has subsequently developed subacute bradycardia.  Abilify can apparently in rare cases cause bradycardia. She is asymptomatic QTc was 468 , WNL Continue telemetry  Hypokalemia Resolved with repletion  Nausea and vomiting Nausea and vomiting have resolved Patient is eating very well Discontinue IV fluids  Metabolic encephalopathy Resolved   DVT prophylaxis: Subcu heparin Code Status: Full Family Communication: None today       Studies: No results found.  Principal Problem:   Suicide attempt [facility]) Active Problems:   Major depressive disorder, recurrent severe without psychotic features (HCC)   PTSD (post-traumatic stress disorder)   GAD (generalized anxiety  disorder)   Toxic metabolic encephalopathy   Acute respiratory failure with hypercapnia (HCC)     Tifini Reeder Orma Flaming, [facility]  If 7PM-7AM, please contact night-coverage [email or web address]   LOS: 6 days

## 2023-04-10 NOTE — Progress Notes (Signed)
The patient expressed concerns regarding placement of her two children. She wanted to call Mika her significant other to request contact information for current caregivers.  Provider contacted to assist. Patient will await psychiatry recommendation before proceeding with call.

## 2023-04-11 ENCOUNTER — Inpatient Hospital Stay: Admission: RE | Admit: 2023-04-11 | Payer: No Payment, Other | Source: Intra-hospital | Admitting: Psychiatry

## 2023-04-11 DIAGNOSIS — F332 Major depressive disorder, recurrent severe without psychotic features: Secondary | ICD-10-CM | POA: Diagnosis not present

## 2023-04-11 DIAGNOSIS — T1491XA Suicide attempt, initial encounter: Secondary | ICD-10-CM | POA: Diagnosis not present

## 2023-04-11 MED ORDER — ARIPIPRAZOLE ER 400 MG IM SRER
300.0000 mg | Freq: Once | INTRAMUSCULAR | Status: AC
  Administered 2023-04-12: 300 mg via INTRAMUSCULAR

## 2023-04-11 NOTE — Progress Notes (Signed)
Housing resources added to AVS

## 2023-04-11 NOTE — Consult Note (Signed)
[facility] Face-to-Face Psychiatry Consult   Reason for Consult:  Suicide attempt Referring Physician:  Dr. Rhona Leavens Patient Identification: Sharon Ward MRN:  [id number] Principal Diagnosis: Suicide attempt [facility]) Diagnosis:  Principal Problem:   Suicide attempt [facility]) Active Problems:   Major depressive disorder, recurrent severe without psychotic features (HCC)   PTSD (post-traumatic stress disorder)   GAD (generalized anxiety disorder)   Toxic metabolic encephalopathy   Acute respiratory failure with hypercapnia (HCC)   Total Time spent with patient: 1 hour  Subjective:   Sharon Ward is a 27 y.o. female patient admitted with suicide attempt by overdose on Gabapentin and Trazodone. She reports being admitted to [facility] after recent suicide attempt.    Upon today's evaluation, the patient exhibited notable improvements and engaged positively with the care team. She was observed sitting on the window sill, interacting with staff with a smile. She proactively presented three sheets of documentation upon my arrival: her safety plan, a list of coping skills, and a to-do list, demonstrating her active involvement in managing her own safety. The patient successfully reviewed and utilized her safety plan over the weekend, which helped her avoid any disruptive behaviors. She also compiled a list of pros and cons regarding her ex-boyfriend, which she credits with aiding her emotional stability during this period.  Despite her initial expectation of being discharged today to prepare for a supervised custody visit, she received the news of the continued stay without displaying previous behaviors of disruption. She effectively managed her disappointment through deep breathing, calm communication, and constructive conversation. This marks a significant improvement in her ability to handle stressful information. A trial court run was conducted where the patient was asked random questions related to her  safety, past hospitalizations, and ongoing mental health efforts. She responded without causing any disruption or engaging in name-calling, behaviors that were previously characteristic during stressful interactions. The patient continues her treatment with approximately 10mg  PO daily and expressed a readiness to transition to depot injections, although she has reservations about needles. This transition is viewed as a positive step towards stable long-term management. The patient was commended for her calm demeanor and her proactive engagement with her treatment plan. Her ability to adapt to disappointing news and maintain composure under stress is encouraging and indicative of significant therapeutic progress. Continued support and reinforcement of these coping mechanisms will be crucial as she prepares for future transitions in her care.  HPI:  27 year old w/ hx of multiple psychosocial issues, MDD, recurrent suicide attempts presenting after intentional overdose of gabapentin and trazodone. Poison control recs observation. Not protecting airway in ER so intubated. At my time of eval she is reaching for tube despite being on very high prop drip including a push. Hx per chart review.   Past Psychiatric History: Bipolar 1 disorder, MDD, GAD, multiple suicide attempts.   Risk to Self:   Yes Risk to Others:   Denies Prior Inpatient Therapy:   Multiple admissions by suicide attempts for overdose and strangulation. 10+ admissions this within the past year.  Prior Outpatient Therapy:   Denies  Past Medical History:  Past Medical History:  Diagnosis Date   Asthma    Bipolar 1 disorder, mixed (HCC)    Depression    Generalized anxiety disorder    Intentional drug overdose (HCC) 11/03/2020   Low-lying placenta 01/07/2022   Resolved 03/04/22   Relationship dysfunction    Suicide attempt (HCC) 11/02/2020    Past Surgical History:  Procedure Laterality Date  PILONIDAL CYST / SINUS EXCISION   09/11/2013   PILONIDAL CYST EXCISION  05/17/2014   Pilonidal cystectomy with cleft lip   Family History:  Family History  Problem Relation Age of Onset   Asthma Mother    Diabetes Mother    Healthy Mother    Hypertension Father    Asthma Father    Diabetes Father    Healthy Father    Asthma Brother    Hypertension Paternal Uncle    Diabetes Paternal Grandmother    Stroke Paternal Grandfather    Heart disease Paternal Grandfather    Hypertension Paternal Grandfather    Diabetes Paternal Grandfather    Family Psychiatric  History: Grandmother diagnosed Bipolar. Grandfather is depressed.  Social History:  Social History   Substance and Sexual Activity  Alcohol Use Not Currently   Comment: occasional prior to pregnancy     Social History   Substance and Sexual Activity  Drug Use Not Currently   Frequency: 4.0 times per week   Types: Marijuana   Comment: No Delta 9 since February 2023    Social History   Socioeconomic History   Marital status: Single    Spouse name: Not on file   Number of children: 1   Years of education: 10   Highest education level: 10th grade  Occupational History   Not on file  Tobacco Use   Smoking status: Former    Current packs/day: 0.00    Average packs/day: 1 pack/day for 15.0 years (15.0 ttl pk-yrs)    Types: Cigarettes    Start date: 07/14/2006    Quit date: 07/14/2021    Years since quitting: 1.7   Smokeless tobacco: Never   Tobacco comments:    only smoke a "couple" of cigarettes when stressed or anxious, socially with friends per [facility] chart  Vaping Use   Vaping status: Former   Start date: 08/10/2003   Quit date: 05/04/2021  Substance and Sexual Activity   Alcohol use: Not Currently    Comment: occasional prior to pregnancy   Drug use: Not Currently    Frequency: 4.0 times per week    Types: Marijuana    Comment: No Delta 9 since February 2023   Sexual activity: Not Currently    Partners: Male    Birth control/protection:  None  Other Topics Concern   Not on file  Social History Narrative   Not on file   Social Determinants of Health   Financial Resource Strain: Not on file  Food Insecurity: Patient Unable To Answer (04/04/2023)   Hunger Vital Sign    Worried About Running Out of Food in the Last Year: Patient unable to answer    Ran Out of Food in the Last Year: Patient unable to answer  Transportation Needs: Patient Unable To Answer (04/04/2023)   PRAPARE - Transportation    Lack of Transportation (Medical): Patient unable to answer    Lack of Transportation (Non-Medical): Patient unable to answer  Physical Activity: Not on file  Stress: Not on file  Social Connections: Not on file   Additional Social History:    Allergies:   Allergies  Allergen Reactions   Ascorbate Rash   Citrus Rash   Coconut Flavor Rash   Lamotrigine Rash   Latex Rash and Other (See Comments)    Skin turns red and breaks out where placed   Orange (Diagnostic) Rash   Peach Flavor Rash   Pear Rash   Pineapple Rash   Tape Rash and  Other (See Comments)    Skin turns red and breaks out where placed    Labs:  Results for orders placed or performed during the hospital encounter of 04/04/23 (from the past 48 hour(s))  Basic metabolic panel     Status: Abnormal   Collection Time: 04/10/23  9:06 AM  Result Value Ref Range   Sodium 137 135 - 145 mmol/L   Potassium 3.8 3.5 - 5.1 mmol/L   Chloride 103 98 - 111 mmol/L   CO2 26 22 - 32 mmol/L   Glucose, Bld 102 (H) 70 - 99 mg/dL    Comment: Glucose reference range applies only to samples taken after fasting for at least 8 hours.   BUN 9 6 - 20 mg/dL   Creatinine, Ser 1.61 0.44 - 1.00 mg/dL   Calcium 9.4 8.9 - 09.6 mg/dL   GFR, Estimated >04 >54 mL/min    Comment: (NOTE) Calculated using the CKD-EPI Creatinine Equation (2021)    Anion gap 8 5 - 15    Comment: Performed at [facility], [address] [place], [place] [address]    Current  Facility-Administered Medications  Medication Dose Route Frequency Provider Last Rate Last Admin   0.9 %  sodium chloride infusion   Intravenous PRN Lorin Glass, MD       ARIPiprazole (ABILIFY) tablet 10 mg  10 mg Oral Once Maryagnes Amos, FNP       Chlorhexidine Gluconate Cloth 2 % PADS 6 each  6 each Topical Daily Lorin Glass, MD   6 each at 04/09/23 9147   docusate sodium (COLACE) capsule 100 mg  100 mg Oral BID PRN Lorin Glass, MD       haloperidol lactate (HALDOL) injection 2 mg  2 mg Intravenous Q6H PRN Maurilio Lovely D, DO   2 mg at 04/07/23 1926   heparin injection 5,000 Units  5,000 Units Subcutaneous Q8H Lorin Glass, MD   5,000 Units at 04/07/23 2146   ketorolac (TORADOL) 30 MG/ML injection 30 mg  30 mg Intravenous Q6H PRN Jerald Kief, MD   30 mg at 04/07/23 8295   lactated ringers infusion   Intravenous Continuous Maurilio Lovely D, DO 75 mL/hr at 04/08/23 1357 New Bag at 04/08/23 1357   metoCLOPramide (REGLAN) injection 5 mg  5 mg Intravenous Q6H PRN Sherryll Burger, Pratik D, DO   5 mg at 04/07/23 1735   ondansetron (ZOFRAN) injection 4 mg  4 mg Intravenous Q8H PRN Anthoney Harada, NP   4 mg at 04/07/23 0356   Oral care mouth rinse  15 mL Mouth Rinse PRN Jerald Kief, MD       polyethylene glycol (MIRALAX / GLYCOLAX) packet 17 g  17 g Oral Daily PRN Lorin Glass, MD        Musculoskeletal: Strength & Muscle Tone: within normal limits Gait & Station: normal Patient leans: N/A            Psychiatric Specialty Exam:  Presentation  General Appearance:  Appropriate for Environment; Casual  Eye Contact: Good  Speech: Clear and Coherent; Normal Rate  Speech Volume: Normal  Handedness: Right   Mood and Affect  Mood: Euthymic  Affect: Appropriate; Congruent   Thought Process  Thought Processes: Coherent; Linear  Descriptions of Associations:Intact  Orientation:Full (Time, Place and Person)  Thought Content:WDL  History of  Schizophrenia/Schizoaffective disorder:No  Duration of Psychotic Symptoms:N/A  Hallucinations:Hallucinations: None  Ideas of Reference:None  Suicidal Thoughts:Suicidal Thoughts: No  Homicidal Thoughts:Homicidal Thoughts: No   Sensorium  Memory: Immediate Good; Recent Good; Remote Good  Judgment: Fair  Insight: Good   Executive Functions  Concentration: Fair  Attention Span: Good  Recall: Good  Fund of Knowledge: Fair  Language: Good   Psychomotor Activity  Psychomotor Activity:Psychomotor Activity: Normal   Assets  Assets: Communication Skills; Desire for Improvement; Social Support; Resilience   Sleep  Sleep:Sleep: Fair   Physical Exam: Physical Exam Vitals and nursing note reviewed.  Constitutional:      Appearance: Normal appearance. She is normal weight.  Skin:    General: Skin is warm.     Capillary Refill: Capillary refill takes less than 2 seconds.  Neurological:     General: No focal deficit present.     Mental Status: She is alert and oriented to person, place, and time. Mental status is at baseline.  Psychiatric:        Attention and Perception: Attention and perception normal. She is attentive.        Mood and Affect: Mood and affect normal.        Speech: Speech normal.        Behavior: Behavior normal. Behavior is cooperative.        Thought Content: Thought content normal. Thought content does not include suicidal ideation.        Cognition and Memory: Cognition and memory normal.        Judgment: Judgment normal.    Review of Systems  All other systems reviewed and are negative.  Blood pressure 121/68, pulse (!) 109, temperature 97.7 F (36.5 C), temperature source Oral, resp. rate 18, height 5\' 2"  (1.575 m), weight 92.2 kg, last menstrual period 03/16/2023, SpO2 100%, not currently breastfeeding. Body mass index is 37.18 kg/m.  Treatment Plan Summary: Daily contact with patient to assess and evaluate symptoms and  progress in treatment, Medication management, and Plan - -Continue safety planning with [facility] ACT team. Safety plan uploaded to chart.  -Patient will benefit from increase of services in the community.  -Continue IVC and safety sitter.  -Will continue ABilify 10mg  po daily for depression.   Labs reviewed appear to be within normal limits. EKG- QTc 470. Will repeat considering she overdosed on Trazodone. UDS positive for THC.   Disposition: No evidence of imminent risk to self or others at present.   Patient does not meet criteria for psychiatric inpatient admission. Supportive therapy provided about ongoing stressors. Plan to discharge home tomorrow after Abilify depot and connecting with ACT team.   Maryagnes Amos, FNP 04/11/2023 3:03 PM

## 2023-04-11 NOTE — Progress Notes (Signed)
Resources for Domestic Violence added to AVS

## 2023-04-11 NOTE — Progress Notes (Signed)
PROGRESS NOTE  [name]  DOB: 1996/07/22  PCP: System, Provider Not In WUJ:[id number]  DOA: 04/04/2023  LOS: 7 days  Hospital Day: 8  Brief narrative: Sharon Ward is a 27 y.o. female with PMH significant for multiple psychosocial issues, MDD, multiple past suicide attempts 8/26, patient took unknown quantity of trazodone and Neurontin to kill herself.  She called her boyfriend who then called EMS.  EMS noted her obtunded and brought to the ED. In the ED, she was intubated for airway protection.  Within 10 minutes of intubation, patient became wide-awake after which she was extubated.  Patient remained significantly agitated She was admitted to ICU Psychiatry consulted Her respiratory status and mental status gradually improved, she was transferred out to [facility] on 8/28   Subjective: Patient was seen and examined this morning.  Pleasant young Caucasian female.  Sitting up in chair.  Not in distress. Seen by psychiatry prior to me this morning.  Noted a plan to monitor for another 24 hours and potentially discharge home tomorrow. In the last 24 hours, patient is afebrile, heart to in 50s, blood pressure stable, breathing on room air  Assessment and plan: Intentional drug overdose Acute metabolic encephalopathy Brought in after an concern of drug overdose with trazodone and gabapentin Initially required intubation for airway protection.  Extubated shortly after due to agitation Was in ICU for agitation.  Gradually improved mental status and currently stable  Suicidal attempt H/o past suicidal attempts H/o bipolar disorder, anxiety MDD Patient was IVC by psychiatry, IVC will need to be renewed on 9/3 Noted plan per psychiatry for potential discharge home tomorrow   Bradycardia Is persistent but not clinically significant Most recent EKG from 8/31 with QTc 468 ms. Continue telemetry monitoring   Hypokalemia Low potassium resolved with repletion Recent Labs  Lab  04/05/23 0300 04/06/23 0306 04/07/23 0300 04/08/23 0303 04/10/23 0906  K 3.2* 3.6 3.5 3.4* 3.8  MG 2.2  --   --  2.1  --    Intermittent nausea and vomiting Improving.  Continue to monitor  Mobility: Encourage ambulation  Goals of care   Code Status: Full Code     DVT prophylaxis:  heparin injection 5,000 Units Start: 04/04/23 1600 SCDs Start: 04/04/23 1549   Antimicrobials: None Fluid: LR at 75 mL/h Consultants: Psychiatry Family Communication: None at bedside  Status: Inpatient Level of care:  Telemetry   Patient is from: Home Anticipated d/c to: Likely home tomorrow after clearance by psychiatry      Diet:  Diet Order             Diet regular Room service appropriate? Yes with Assist; Fluid consistency: Thin  Diet effective now                   Scheduled Meds:  ARIPiprazole  10 mg Oral Once   Chlorhexidine Gluconate Cloth  6 each Topical Daily   heparin  5,000 Units Subcutaneous Q8H    PRN meds: sodium chloride, docusate sodium, haloperidol lactate, ketorolac, metoCLOPramide (REGLAN) injection, ondansetron (ZOFRAN) IV, mouth rinse, polyethylene glycol   Infusions:   sodium chloride     lactated ringers 75 mL/hr at 04/08/23 1357    Antimicrobials: Anti-infectives (From admission, onward)    None       Nutritional status:  Body mass index is 37.18 kg/m.          Objective: Vitals:   04/10/23 2009 04/11/23 0629  BP: 118/66 112/60  Pulse: (!) 57 (!) 49  Resp: 15   Temp: 97.9 F (36.6 C) 97.9 F (36.6 C)  SpO2: 100% 99%   No intake or output data in the 24 hours ending 04/11/23 0929 Filed Weights   04/08/23 0700 04/09/23 0617 04/11/23 0500  Weight: 93.2 kg 92.2 kg 92.2 kg   Weight change:  Body mass index is 37.18 kg/m.   Physical Exam: General exam: Pleasant, young Caucasian female.  Not in physical distress Skin: No rashes, lesions or ulcers. HEENT: Atraumatic, normocephalic, no obvious bleeding Lungs: Clear to  auscultation bilaterally CVS: Regular rate and rhythm, no murmur GI/Abd: soft, nontender, nondistended, bowel sound present CNS: Alert, awake, oriented x 3 Psychiatry: Mood appropriate Extremities: No pedal edema, no calf tenderness  Data Review: I have personally reviewed the laboratory data and studies available.  F/u labs ordered Unresulted Labs (From admission, onward)    None       Total time spent in review of labs and imaging, patient evaluation, formulation of plan, documentation and communication with family: 45 minutes  Signed, Lorin Glass, MD [facility] 04/11/2023

## 2023-04-11 NOTE — TOC Progression Note (Addendum)
Transition of Care [facility]) - Progression Note    Patient Details  Name: Sharon Ward MRN: [id number] Date of Birth: 19-Feb-1996  Transition of Care [facility]) CM/SW Contact  Howell Rucks, RN Phone Number: 04/11/2023, 10:15 AM  Clinical Narrative:  Teams chat received from attending, dc plan for IP psych, requesting fax out for bed offers. [facility] currently following, faxed out to IP Psych facilities, await bed offers.   -1:12pm DC plan changed to DC home tomorrow as pt no longer meets criteria for IP psych( pt was initially reviewed by [facility] w[facility], states pt will continue under IVC today and she will rescind tomorrow and place on chart.          Expected Discharge Plan and Services                                               Social Determinants of Health (SDOH) Interventions SDOH Screenings   Food Insecurity: Patient Unable To Answer (04/04/2023)  Housing: High Risk (04/04/2023)  Transportation Needs: Patient Unable To Answer (04/04/2023)  Utilities: Not At Risk (04/08/2023)  Alcohol Screen: Low Risk  (03/18/2023)  Recent Concern: Alcohol Screen - Medium Risk (01/01/2023)  Depression (PHQ2-9): High Risk (09/16/2022)  Tobacco Use: Medium Risk (04/04/2023)    Readmission Risk Interventions    04/06/2023    6:51 PM  Readmission Risk Prevention Plan  Transportation Screening Complete  Medication Review (RN Care Manager) Complete  PCP or Specialist appointment within 3-5 days of discharge Complete  HRI or Home Care Consult Complete  SW Recovery Care/Counseling Consult Complete  Palliative Care Screening Not Applicable  Skilled Nursing Facility Not Applicable

## 2023-04-12 DIAGNOSIS — F332 Major depressive disorder, recurrent severe without psychotic features: Secondary | ICD-10-CM | POA: Diagnosis not present

## 2023-04-12 DIAGNOSIS — T1491XA Suicide attempt, initial encounter: Secondary | ICD-10-CM | POA: Diagnosis not present

## 2023-04-12 NOTE — Progress Notes (Addendum)
Patient [facility]) discharged. When she has her belongings back and she gave me a bag medication to throw away. Gabapentin, 2 bottle hydroxyzine ( one white pills and other one green), duloxetine, trazodone. II talked to my charge nurse about this and put all medications into the steri cycle.

## 2023-04-12 NOTE — Plan of Care (Signed)
  Problem: Education: Goal: Knowledge of General Education information will improve Description: Including pain rating scale, medication(s)/side effects and non-pharmacologic comfort measures Outcome: Progressing   Problem: Coping: Goal: Level of anxiety will decrease 04/12/2023 0418 by Damita Lack, RN Outcome: Progressing 04/12/2023 0418 by Damita Lack, RN Outcome: Progressing   Problem: Safety: Goal: Ability to remain free from injury will improve 04/12/2023 0418 by Damita Lack, RN Outcome: Progressing 04/12/2023 0418 by Damita Lack, RN Outcome: Progressing

## 2023-04-12 NOTE — Discharge Summary (Signed)
Physician Discharge Summary  Sharon Ward ZOX:[id number] DOB: 11/30/1995 DOA: 04/04/2023  PCP: System, Provider Not In  Admit date: 04/04/2023 Discharge date: 04/12/2023  Admitted From: Home Discharge disposition: Home   Brief narrative: Sharon Ward is a 27 y.o. female with PMH significant for multiple psychosocial issues, MDD, multiple past suicide attempts 8/26, patient took unknown quantity of trazodone and Neurontin to kill herself.  She called her boyfriend who then called EMS.  EMS noted her obtunded and brought to the ED. In the ED, she was intubated for airway protection.  Within 10 minutes of intubation, patient became wide-awake after which she was extubated.  Patient remained significantly agitated She was admitted to ICU Psychiatry consulted Her respiratory status and mental status gradually improved, she was transferred out to [facility] on 8/28   Subjective: Patient was seen and examined this morning.  Sitting up at the edge of the bed.  Not in distress. Getting ready for discharge home today.  Hospital course: Intentional drug overdose Acute metabolic encephalopathy Brought in after an concern of drug overdose with trazodone and gabapentin Initially required intubation for airway protection.  Extubated shortly after due to agitation Was in ICU for agitation.  Gradually improved mental status and currently stable  Suicidal attempt H/o past suicidal attempts H/o bipolar disorder, anxiety MDD Patient was IVC by psychiatry, IVC will need to be renewed on 9/3 Noted plan per psychiatry for potential discharge home tomorrow   Bradycardia Is persistent but not clinically significant Most recent EKG from 8/31 with QTc 468 ms. Continue telemetry monitoring   Hypokalemia Low potassium resolved with repletion Recent Labs  Lab 04/06/23 0306 04/07/23 0300 04/08/23 0303 04/10/23 0906  K 3.6 3.5 3.4* 3.8  MG  --   --  2.1  --    Intermittent nausea and  vomiting Improving.  Continue to monitor  Mobility: Encourage ambulation  Goals of care   Code Status: Full Code   Wounds:  -    Discharge Exam:   Vitals:   04/11/23 0629 04/11/23 1408 04/11/23 1959 04/12/23 0641  BP: 112/60 121/68 120/70 (!) 96/50  Pulse: (!) 49 (!) 109 (!) 54 (!) 57  Resp:  18 20 17   Temp: 97.9 F (36.6 C) 97.7 F (36.5 C) 98 F (36.7 C)   TempSrc: Oral Oral Oral   SpO2: 99% 100% 99%   Weight:      Height:        Body mass index is 37.18 kg/m.   General exam: Pleasant, young Caucasian female.  Not in physical distress Skin: No rashes, lesions or ulcers. HEENT: Atraumatic, normocephalic, no obvious bleeding Lungs: Clear to auscultation bilaterally CVS: Regular rate and rhythm, no murmur GI/Abd: soft, nontender, nondistended, bowel sound present CNS: Alert, awake, oriented x 3 Psychiatry: Mood appropriate Extremities: No pedal edema, no calf tenderness  Follow ups:    Follow-up Information     [facility] Follow up.   Contact information: [address] [place] [address] [phone]        [facility] .   Specialty: [facility] information: [address] [place] [address] [place] [address] [phone]                Discharge Instructions:   Discharge Instructions     Call MD for:  difficulty breathing, headache or visual disturbances   Complete by: As directed    Call MD for:  extreme fatigue   Complete by: As directed    Call MD for:  hives   Complete by: As directed    Call MD for:  persistant dizziness or light-headedness   Complete by: As directed    Call MD for:  persistant nausea and vomiting   Complete by: As directed    Call MD for:  severe uncontrolled pain   Complete by: As directed    Call MD for:  temperature >100.4   Complete by: As directed    Diet general   Complete by: As  directed    Discharge instructions   Complete by: As directed    F/u with psych as an outpatient  General discharge instructions: Follow with Primary MD System, Provider Not In in 7 days  Please request your PCP  to go over your hospital tests, procedures, radiology results at the follow up. Please get your medicines reviewed and adjusted.  Your PCP may decide to repeat certain labs or tests as needed. Do not drive, operate heavy machinery, perform activities at heights, swimming or participation in water activities or provide baby sitting services if your were admitted for syncope or siezures until you have seen by Primary MD or a Neurologist and advised to do so again. [place] Controlled Substance Reporting System database was reviewed. Do not drive, operate heavy machinery, perform activities at heights, swim, participate in water activities or provide baby-sitting services while on medications for pain, sleep and mood until your outpatient physician has reevaluated you and advised to do so again.  You are strongly recommended to comply with the dose, frequency and duration of prescribed medications. Activity: As tolerated with Full fall precautions use walker/cane & assistance as needed Avoid using any recreational substances like cigarette, tobacco, alcohol, or non-prescribed drug. If you experience worsening of your admission symptoms, develop shortness of breath, life threatening emergency, suicidal or homicidal thoughts you must seek medical attention immediately by calling 911 or calling your MD immediately  if symptoms less severe. You must read complete instructions/literature along with all the possible adverse reactions/side effects for all the medicines you take and that have been prescribed to you. Take any new medicine only after you have completely understood and accepted all the possible adverse reactions/side effects.  Wear Seat belts while driving. You were cared for by a  hospitalist during your hospital stay. If you have any questions about your discharge medications or the care you received while you were in the hospital after you are discharged, you can call the unit and ask to speak with the hospitalist or the covering physician. Once you are discharged, your primary care physician will handle any further medical issues. Please note that NO REFILLS for any discharge medications will be authorized once you are discharged, as it is imperative that you return to your primary care physician (or establish a relationship with a primary care physician if you do not have one).   Increase activity slowly   Complete by: As directed        Discharge Medications:   Allergies as of 04/12/2023       Reactions   Ascorbate Rash   Citrus Rash   Coconut Flavor Rash   Lamotrigine Rash   Latex Rash, Other (See Comments)   Skin turns red and breaks out where placed   Orange (diagnostic) Rash   Peach Flavor Rash   Pear Rash   Pineapple Rash   Tape Rash, Other (See Comments)   Skin  turns red and breaks out where placed        Medication List     STOP taking these medications    DULoxetine 60 MG capsule Commonly known as: CYMBALTA   gabapentin 100 MG capsule Commonly known as: NEURONTIN   gabapentin 300 MG capsule Commonly known as: NEURONTIN   hydrOXYzine 25 MG capsule Commonly known as: VISTARIL   traZODone 50 MG tablet Commonly known as: DESYREL         The results of significant diagnostics from this hospitalization (including imaging, microbiology, ancillary and laboratory) are listed below for reference.    Procedures and Diagnostic Studies:   DG Chest Port 1 View  Result Date: 04/04/2023 CLINICAL DATA:  Shortness of breath EXAM: PORTABLE CHEST 1 VIEW COMPARISON:  03/24/2023 FINDINGS: Lungs are clear.  No pleural effusion or pneumothorax. The heart is normal in size. IMPRESSION: No acute cardiopulmonary disease. Electronically Signed   By:  Charline Bills M.D.   On: 04/04/2023 15:53   CT Head Wo Contrast  Result Date: 04/04/2023 CLINICAL DATA:  Neuro deficit, acute, stroke suspected EXAM: CT HEAD WITHOUT CONTRAST TECHNIQUE: Contiguous axial images were obtained from the base of the skull through the vertex without intravenous contrast. RADIATION DOSE REDUCTION: This exam was performed according to the departmental dose-optimization program which includes automated exposure control, adjustment of the mA and/or kV according to patient size and/or use of iterative reconstruction technique. COMPARISON:  CT Head 03/24/23 FINDINGS: Brain: No evidence of acute infarction, hemorrhage, hydrocephalus, extra-axial collection or mass lesion/mass effect. Vascular: No hyperdense vessel or unexpected calcification. Skull: Normal. Negative for fracture or focal lesion. Sinuses/Orbits: No middle ear or mastoid effusion. Paranasal sinuses are clear. Orbits are unremarkable. Other: None. IMPRESSION: No acute intracranial abnormality. Electronically Signed   By: Lorenza Cambridge M.D.   On: 04/04/2023 15:27     Labs:   Basic Metabolic Panel: Recent Labs  Lab 04/06/23 0306 04/07/23 0300 04/08/23 0303 04/10/23 0906  NA 137 138 138 137  K 3.6 3.5 3.4* 3.8  CL 107 105 107 103  CO2 25 26 23 26   GLUCOSE 99 110* 91 102*  BUN 10 11 9 9   CREATININE 0.74 0.70 0.71 0.75  CALCIUM 8.6* 8.7* 9.0 9.4  MG  --   --  2.1  --    GFR Estimated Creatinine Clearance: 113.5 mL/min (by C-G formula based on SCr of 0.75 mg/dL). Liver Function Tests: Recent Labs  Lab 04/07/23 0300 04/08/23 0303  AST 16 13*  ALT 16 15  ALKPHOS 56 48  BILITOT 0.3 0.4  PROT 6.8 6.5  ALBUMIN 3.7 3.6   No results for input(s): "LIPASE", "AMYLASE" in the last 168 hours. No results for input(s): "AMMONIA" in the last 168 hours. Coagulation profile No results for input(s): "INR", "PROTIME" in the last 168 hours.  CBC: Recent Labs  Lab 04/07/23 0300 04/08/23 0303  WBC 5.6 5.0   HGB 11.6* 11.4*  HCT 36.4 35.3*  MCV 89.7 89.1  PLT 240 218   Cardiac Enzymes: No results for input(s): "CKTOTAL", "CKMB", "CKMBINDEX", "TROPONINI" in the last 168 hours. BNP: Invalid input(s): "POCBNP" CBG: No results for input(s): "GLUCAP" in the last 168 hours. D-Dimer No results for input(s): "DDIMER" in the last 72 hours. Hgb A1c No results for input(s): "HGBA1C" in the last 72 hours. Lipid Profile No results for input(s): "CHOL", "HDL", "LDLCALC", "TRIG", "CHOLHDL", "LDLDIRECT" in the last 72 hours. Thyroid function studies No results for input(s): "TSH", "T4TOTAL", "T3FREE", "THYROIDAB" in  the last 72 hours.  Invalid input(s): "FREET3" Anemia work up No results for input(s): "VITAMINB12", "FOLATE", "FERRITIN", "TIBC", "IRON", "RETICCTPCT" in the last 72 hours. Microbiology No results found for this or any previous visit (from the past 240 hour(s)).  Time coordinating discharge: 45 minutes  Signed: Tanay Massiah  [facility] 04/12/2023, 3:54 PM

## 2023-04-12 NOTE — Consult Note (Signed)
[facility] Face-to-Face Psychiatry Consult   Reason for Consult:  Suicide attempt Referring Physician:  Dr. Rhona Leavens Patient Identification: Sharon Ward MRN:  [id number] Principal Diagnosis: Suicide attempt [facility]) Diagnosis:  Principal Problem:   Suicide attempt [facility]) Active Problems:   Major depressive disorder, recurrent severe without psychotic features (HCC)   PTSD (post-traumatic stress disorder)   GAD (generalized anxiety disorder)   Toxic metabolic encephalopathy   Acute respiratory failure with hypercapnia (HCC)   Total Time spent with patient: 1 hour  Subjective:   Sharon Ward is a 27 y.o. female patient admitted with suicide attempt by overdose on Gabapentin and Trazodone. She reports being admitted to [facility] after recent suicide attempt.   During today's evaluation, the patient showed significant progress in maintaining safety and engaging positively with the care team. She reviewed her coping skills, goals, and to-do list, and reported proactive steps in coordinating with her ACT team and [facility] housing. Over the holiday, she effectively utilized her safety plan, avoiding disruptive behaviors.  Although anticipating discharge today for a supervised custody visit, the patient handled the disappointment calmly, using deep breathing and constructive communication. She agreed to treatment with Abilify Maintena, despite her fear of needles, viewing it as a positive step toward long-term stability. Her calm demeanor and ability to manage stress indicate substantial therapeutic progress, and continued support will be essential as she transitions in her care.  The patient, with a history of severe Borderline Personality Disorder and multiple recent suicide attempts, has shown significant stabilization during her current inpatient stay. She has demonstrated improved emotional regulation, effective use of coping skills, and maintained safety for over six days without disruptive behaviors  or the need for PRN medications.  The patient has actively engaged in her treatment plan, including agreeing to a long-acting injectable (LAI) of Abilify to reduce the risk of medication non-compliance and overdose post-discharge. She is also set up for ongoing outpatient care, including Dialectical Behavior Therapy (DBT) and continued support from her ACT team, which are crucial for her long-term recovery.  Given her progress, continued inpatient hospitalization is no longer beneficial and may reinforce maladaptive behaviors. Instead, outpatient care is more appropriate to address her chronic issues and support her sustained recovery. Therefore, the patient is deemed safe for discharge, with the necessary resources in place for her ongoing treatment.  HPI:  Sharon Ward is a 27 year old female with a history of borderline personality disorder, depression, rule out bipolar II disorder, PTSD who presents to the ED after a suicide attempt by overdose on gabapentin and Trazodone. On initial evaluation, patient noted to be poorly cooperative, significantly dysregulated, irritable, loud, tearful, psychomotor agitated, externalizing blame, linear. Patient endorses depressed mood, insomnia, anhedonia, fatigue, poor concentration, hopelessness, worthlessness. Endorses ongoing active suicidal ideation with intent, declines to discuss the plan she has. Denies symptoms consistent with mania/hypomania, paranoia, auditory and visual hallucinations, homicidal ideation. Patient presentation is consistent with borderline personality disorder and MDD, recurrent severe, without psychotic features. Patient attempted suicide tonight and continues to be dysregulated and endorsing suicidal ideation and unable to engage in safety planning. She is high risk for suicide due to prior attempts, impulsivity, depression, hopelessness. Therefore, inpatient psychiatric hospitalization is recommended for stabilization and medication  management.   Past Psychiatric History: Bipolar 1 disorder, MDD, GAD, multiple suicide attempts.   Risk to Self:   Yes Risk to Others:   Denies Prior Inpatient Therapy:   Multiple admissions by suicide attempts for overdose and strangulation. 10+ admissions this within  the past year.  Prior Outpatient Therapy:   Denies  Past Medical History:  Past Medical History:  Diagnosis Date   Asthma    Bipolar 1 disorder, mixed (HCC)    Depression    Generalized anxiety disorder    Intentional drug overdose (HCC) 11/03/2020   Low-lying placenta 01/07/2022   Resolved 03/04/22   Relationship dysfunction    Suicide attempt (HCC) 11/02/2020    Past Surgical History:  Procedure Laterality Date   PILONIDAL CYST / SINUS EXCISION  09/11/2013   PILONIDAL CYST EXCISION  05/17/2014   Pilonidal cystectomy with cleft lip   Family History:  Family History  Problem Relation Age of Onset   Asthma Mother    Diabetes Mother    Healthy Mother    Hypertension Father    Asthma Father    Diabetes Father    Healthy Father    Asthma Brother    Hypertension Paternal Uncle    Diabetes Paternal Grandmother    Stroke Paternal Grandfather    Heart disease Paternal Grandfather    Hypertension Paternal Grandfather    Diabetes Paternal Grandfather    Family Psychiatric  History: Grandmother diagnosed Bipolar. Grandfather is depressed.  Social History:  Social History   Substance and Sexual Activity  Alcohol Use Not Currently   Comment: occasional prior to pregnancy     Social History   Substance and Sexual Activity  Drug Use Not Currently   Frequency: 4.0 times per week   Types: Marijuana   Comment: No Delta 9 since February 2023    Social History   Socioeconomic History   Marital status: Single    Spouse name: Not on file   Number of children: 1   Years of education: 10   Highest education level: 10th grade  Occupational History   Not on file  Tobacco Use   Smoking status: Former     Current packs/day: 0.00    Average packs/day: 1 pack/day for 15.0 years (15.0 ttl pk-yrs)    Types: Cigarettes    Start date: 07/14/2006    Quit date: 07/14/2021    Years since quitting: 1.7   Smokeless tobacco: Never   Tobacco comments:    only smoke a "couple" of cigarettes when stressed or anxious, socially with friends per [facility] chart  Vaping Use   Vaping status: Former   Start date: 08/10/2003   Quit date: 05/04/2021  Substance and Sexual Activity   Alcohol use: Not Currently    Comment: occasional prior to pregnancy   Drug use: Not Currently    Frequency: 4.0 times per week    Types: Marijuana    Comment: No Delta 9 since February 2023   Sexual activity: Not Currently    Partners: Male    Birth control/protection: None  Other Topics Concern   Not on file  Social History Narrative   Not on file   Social Determinants of Health   Financial Resource Strain: Not on file  Food Insecurity: Patient Unable To Answer (04/04/2023)   Hunger Vital Sign    Worried About Running Out of Food in the Last Year: Patient unable to answer    Ran Out of Food in the Last Year: Patient unable to answer  Transportation Needs: Patient Unable To Answer (04/04/2023)   PRAPARE - Transportation    Lack of Transportation (Medical): Patient unable to answer    Lack of Transportation (Non-Medical): Patient unable to answer  Physical Activity: Not on file  Stress: Not on  file  Social Connections: Not on file   Additional Social History:    Allergies:   Allergies  Allergen Reactions   Ascorbate Rash   Citrus Rash   Coconut Flavor Rash   Lamotrigine Rash   Latex Rash and Other (See Comments)    Skin turns red and breaks out where placed   Orange (Diagnostic) Rash   Peach Flavor Rash   Pear Rash   Pineapple Rash   Tape Rash and Other (See Comments)    Skin turns red and breaks out where placed    Labs:  No results found for this or any previous visit (from the past 48 hour(s)).   Current  Facility-Administered Medications  Medication Dose Route Frequency Provider Last Rate Last Admin   0.9 %  sodium chloride infusion   Intravenous PRN Lorin Glass, MD       ARIPiprazole (ABILIFY) tablet 10 mg  10 mg Oral Once Maryagnes Amos, FNP       ARIPiprazole ER (ABILIFY MAINTENA) injection 300 mg  300 mg Intramuscular Once Maryagnes Amos, FNP       Chlorhexidine Gluconate Cloth 2 % PADS 6 each  6 each Topical Daily Lorin Glass, MD   6 each at 04/09/23 1610   docusate sodium (COLACE) capsule 100 mg  100 mg Oral BID PRN Lorin Glass, MD       haloperidol lactate (HALDOL) injection 2 mg  2 mg Intravenous Q6H PRN Maurilio Lovely D, DO   2 mg at 04/07/23 1926   heparin injection 5,000 Units  5,000 Units Subcutaneous Q8H Lorin Glass, MD   5,000 Units at 04/07/23 2146   lactated ringers infusion   Intravenous Continuous Maurilio Lovely D, DO 75 mL/hr at 04/08/23 1357 New Bag at 04/08/23 1357   metoCLOPramide (REGLAN) injection 5 mg  5 mg Intravenous Q6H PRN Sherryll Burger, Pratik D, DO   5 mg at 04/07/23 1735   ondansetron (ZOFRAN) injection 4 mg  4 mg Intravenous Q8H PRN Anthoney Harada, NP   4 mg at 04/07/23 0356   Oral care mouth rinse  15 mL Mouth Rinse PRN Jerald Kief, MD       polyethylene glycol (MIRALAX / GLYCOLAX) packet 17 g  17 g Oral Daily PRN Lorin Glass, MD        Musculoskeletal: Strength & Muscle Tone: within normal limits Gait & Station: normal Patient leans: N/A            Psychiatric Specialty Exam:  Presentation  General Appearance:  Appropriate for Environment; Casual  Eye Contact: Good  Speech: Clear and Coherent; Normal Rate  Speech Volume: Normal  Handedness: Right   Mood and Affect  Mood: Euthymic  Affect: Appropriate; Congruent   Thought Process  Thought Processes: Coherent; Linear  Descriptions of Associations:Intact  Orientation:Full (Time, Place and Person)  Thought Content:WDL  History of  Schizophrenia/Schizoaffective disorder:No  Duration of Psychotic Symptoms:N/A  Hallucinations:Hallucinations: None  Ideas of Reference:None  Suicidal Thoughts:Suicidal Thoughts: No  Homicidal Thoughts:Homicidal Thoughts: No   Sensorium  Memory: Immediate Good; Recent Good; Remote Good  Judgment: Fair  Insight: Good   Executive Functions  Concentration: Fair  Attention Span: Good  Recall: Good  Fund of Knowledge: Fair  Language: Good   Psychomotor Activity  Psychomotor Activity:Psychomotor Activity: Normal   Assets  Assets: Communication Skills; Desire for Improvement; Social Support; Resilience   Sleep  Sleep:Sleep: Fair   Physical Exam: Physical Exam Vitals and nursing note  reviewed.  Constitutional:      Appearance: Normal appearance. She is normal weight.  Skin:    General: Skin is warm.     Capillary Refill: Capillary refill takes less than 2 seconds.  Neurological:     General: No focal deficit present.     Mental Status: She is alert and oriented to person, place, and time. Mental status is at baseline.  Psychiatric:        Attention and Perception: Attention and perception normal. She is attentive.        Mood and Affect: Mood and affect normal.        Speech: Speech normal.        Behavior: Behavior normal. Behavior is cooperative.        Thought Content: Thought content normal. Thought content does not include suicidal ideation.        Cognition and Memory: Cognition and memory normal.        Judgment: Judgment normal.    Review of Systems  All other systems reviewed and are negative.  Blood pressure (!) 96/50, pulse (!) 57, temperature 98 F (36.7 C), temperature source Oral, resp. rate 17, height 5\' 2"  (1.575 m), weight 92.2 kg, last menstrual period 03/16/2023, SpO2 99%, not currently breastfeeding. Body mass index is 37.18 kg/m.  Sharon Ward is a 27 year old female who presented as a suicide attempt by overdose.  This patient has had approximately 7 inpatient psychiatric hospitalizations in 2024, and over 10 ER visits. The patient is deemed safe for discharge to outpatient care with continued oversight from the ACT team, and she has been provided with the appropriate resources to support her ongoing recovery. The decision to discharge is in alignment with the patient's current clinical presentation and treatment needs.  Treatment Plan Summary: Daily contact with patient to assess and evaluate symptoms and progress in treatment, Medication management, and Plan - -Continue safety planning with [facility] ACT team. Safety plan uploaded to chart.  -Patient will benefit from increase of services in the community.  -Rescind IVC and  DC safety sitter.  -Will administer Abilify Maintenna 300mg  IM in a single dose. ACT team to assume care.    Labs reviewed appear to be within normal limits. EKG- QTc 470. Will repeat considering she overdosed on Trazodone. UDS positive for THC.   Disposition: No evidence of imminent risk to self or others at present.   Patient does not meet criteria for psychiatric inpatient admission. Supportive therapy provided about ongoing stressors.    Maryagnes Amos, FNP 04/12/2023 11:12 AM

## 2023-04-12 NOTE — TOC Progression Note (Addendum)
Transition of Care [facility]) - Progression Note    Patient Details  Name: Phaedra Elsey MRN: [id number] Date of Birth: [birth year]  Transition of Care [facility]) CM/SW Contact  Geni Bers, RN Phone Number: 04/12/2023, 12:32 PM  Clinical Narrative:     Pt will discharge home. IVC was descended, confirmed by Malachy Chamber, FNP 04/12/23 at 1230.       Expected Discharge Plan and Services         Expected Discharge Date: 04/12/23                                     Social Determinants of Health (SDOH) Interventions SDOH Screenings   Food Insecurity: Patient Unable To Answer (04/04/2023)  Housing: High Risk (04/04/2023)  Transportation Needs: Patient Unable To Answer (04/04/2023)  Utilities: Not At Risk (04/08/2023)  Alcohol Screen: Low Risk  (03/18/2023)  Recent Concern: Alcohol Screen - Medium Risk (01/01/2023)  Depression (PHQ2-9): High Risk (09/16/2022)  Tobacco Use: Medium Risk (04/04/2023)    Readmission Risk Interventions    04/06/2023    6:51 PM  Readmission Risk Prevention Plan  Transportation Screening Complete  Medication Review (RN Care Manager) Complete  PCP or Specialist appointment within 3-5 days of discharge Complete  HRI or Home Care Consult Complete  SW Recovery Care/Counseling Consult Complete  Palliative Care Screening Not Applicable  Skilled Nursing Facility Not Applicable

## 2023-04-25 ENCOUNTER — Encounter: Payer: Self-pay | Admitting: Advanced Practice Midwife

## 2023-04-25 ENCOUNTER — Ambulatory Visit: Payer: MEDICAID

## 2023-04-25 DIAGNOSIS — Z3202 Encounter for pregnancy test, result negative: Secondary | ICD-10-CM

## 2023-04-25 DIAGNOSIS — Z3200 Encounter for pregnancy test, result unknown: Secondary | ICD-10-CM

## 2023-04-25 LAB — POCT PREGNANCY, URINE: Preg Test, Ur: NEGATIVE

## 2023-04-25 NOTE — Progress Notes (Signed)
Possible Pregnancy  Here today to leave urine specimen for pregnancy confirmation. UPT in office today is negative. Denies any positive UPT at home. Also had negative UPT 04/09/23. Currently using Paragard for contraception. Pt was concerned for pregnancy due to no normal period in August. Normally has period of 5-7 days of bleeding. Describes period of brown spotting for 2 days at the end of August. Also reports the following intermittent symptoms:  -abdominal cramps -waking up very hungry, even when eating right before bed -nauseas -dizzy -faint -recent syncopal episode  She is established with PCP and has been previously referred to cardiology but was unable to make appt. Encouraged pt to call PCP for appt for these concerns as soon as possible. Reviewed menstrual period could have been affected by recent stressful event; she reports being admitted to ICU due to overdose. Offered provider visit to discuss missed period or for patient to monitor over next month and schedule if this does not resolve. Pt would like to monitor menstrual period and will send message if she would like to schedule.  Marjo Bicker, RN 04/25/2023  11:42 AM

## 2023-04-25 NOTE — Telephone Encounter (Signed)
Patient called; see visit encounter 04/25/23 for documentation.

## 2023-05-01 ENCOUNTER — Encounter: Payer: Self-pay | Admitting: Obstetrics and Gynecology

## 2023-05-02 ENCOUNTER — Encounter (HOSPITAL_COMMUNITY): Payer: Self-pay

## 2023-05-02 ENCOUNTER — Inpatient Hospital Stay (HOSPITAL_COMMUNITY)
Admission: AD | Admit: 2023-05-02 | Discharge: 2023-05-02 | Disposition: A | Discharge status: Home / Self Care | Payer: MEDICAID | Attending: Family Medicine | Admitting: Family Medicine

## 2023-05-02 DIAGNOSIS — Z975 Presence of (intrauterine) contraceptive device: Secondary | ICD-10-CM | POA: Insufficient documentation

## 2023-05-02 DIAGNOSIS — N898 Other specified noninflammatory disorders of vagina: Secondary | ICD-10-CM | POA: Insufficient documentation

## 2023-05-02 DIAGNOSIS — R1032 Left lower quadrant pain: Secondary | ICD-10-CM | POA: Diagnosis present

## 2023-05-02 DIAGNOSIS — Z711 Person with feared health complaint in whom no diagnosis is made: Secondary | ICD-10-CM | POA: Diagnosis not present

## 2023-05-02 DIAGNOSIS — Z3202 Encounter for pregnancy test, result negative: Secondary | ICD-10-CM | POA: Diagnosis present

## 2023-05-02 DIAGNOSIS — Z789 Other specified health status: Secondary | ICD-10-CM | POA: Insufficient documentation

## 2023-05-02 LAB — POCT PREGNANCY, URINE: Preg Test, Ur: NEGATIVE

## 2023-05-02 NOTE — MAU Note (Signed)
.  Sharon Ward is a 27 y.o. at Unknown here in MAU reporting: Has not had a full period over the past two months. She reports spotting and brown vaginal discharge. She reports an occasional dull pain in her left lower abdomen. Last felt last night. Also reports fatigue and food cravings. Reports two pregnancy tests at the end of August as well as the end of last week and both were negative. She reports she came here because the OBGYN is "so packed." Reports she is also nervous that she could have a UTI or an ectopic pregnancy. RN reassured patient she would still have a positive pregnancy test with an ectopic pregnancy. Patient relieved.  Paragard IUD for conception. Admitted to ICU for overdose 8/26.  Negative UPT in MAU.  LMP: Sometime in July Onset of complaint: Two months Pain score: Denies current pain.   FHT: n/a Lab orders placed from triage:  POCT Preg

## 2023-05-02 NOTE — MAU Provider Note (Signed)
Event Date/Time   First Provider Initiated Contact with Patient 05/02/23 [phone]      S [name] is a 27 y.o. [id number] patient who presents to MAU today without complaints. She reports that for the last 2 months she has not had a menstrual period. She reports that she has a few days of brown spotting but nothing else. She denies attempting to make an appointment with OBGYN and reports that she has ha PrarGuard IUD in place. She denies abdominal pain of discomfort at this time and denies having a positive pregnancy test at home.     O Pulse 65   Temp 97.9 F (36.6 C) (Oral)   Resp 16   Ht 5\' 3"  (1.6 m)   Wt 90.2 kg   SpO2 100%   BMI 35.23 kg/m  Physical Exam Vitals and nursing note reviewed.  Constitutional:      General: She is not in acute distress.    Appearance: Normal appearance.  HENT:     Head: Normocephalic.  Pulmonary:     Effort: Pulmonary effort is normal.  Musculoskeletal:     Cervical back: Normal range of motion.  Skin:    General: Skin is warm and dry.  Neurological:     Mental Status: She is alert and oriented to person, place, and time.  Psychiatric:        Mood and Affect: Mood normal.     A Medical screening exam complete   P 1. Physically well but worried   2. Not currently pregnant   3. Vaginal discharge     - Discharge from MAU in stable condition - Patient given the option of transfer to [facility] for further evaluation or seek care in outpatient facility of choice  - List of options for follow-up given  - Message sent to office to get patient scheduled for GYN visit.  - Warning signs for worsening condition that would warrant emergency follow-up discussed - Patient may return to MAU as needed   Carlynn Herald, [place] 05/02/2023 7:47 AM

## 2023-05-03 ENCOUNTER — Telehealth: Payer: Self-pay | Admitting: Obstetrics and Gynecology

## 2023-05-03 NOTE — Telephone Encounter (Signed)
Called patient with no answer, left a message to call back to get scheduled.

## 2023-05-17 ENCOUNTER — Telehealth (HOSPITAL_COMMUNITY): Payer: Self-pay | Admitting: Professional

## 2023-05-17 ENCOUNTER — Ambulatory Visit (INDEPENDENT_AMBULATORY_CARE_PROVIDER_SITE_OTHER): Payer: MEDICAID | Admitting: Student

## 2023-05-17 DIAGNOSIS — F3181 Bipolar II disorder: Secondary | ICD-10-CM | POA: Diagnosis not present

## 2023-05-17 DIAGNOSIS — F411 Generalized anxiety disorder: Secondary | ICD-10-CM | POA: Diagnosis not present

## 2023-05-17 DIAGNOSIS — F603 Borderline personality disorder: Secondary | ICD-10-CM

## 2023-05-17 DIAGNOSIS — Z79899 Other long term (current) drug therapy: Secondary | ICD-10-CM

## 2023-05-17 MED ORDER — TRAZODONE HCL 50 MG PO TABS
25.0000 mg | ORAL_TABLET | Freq: Every day | ORAL | 3 refills | Status: DC
Start: 2023-05-17 — End: 2023-07-06

## 2023-05-17 MED ORDER — ARIPIPRAZOLE 15 MG PO TABS
15.0000 mg | ORAL_TABLET | Freq: Every day | ORAL | 3 refills | Status: DC
Start: 2023-05-17 — End: 2023-08-26

## 2023-05-17 NOTE — Progress Notes (Signed)
Psychiatric Initial Adult Assessment  Patient Identification: Sharon Ward MRN:  [id number] Date of Evaluation:  05/17/2023 Referral Source: self  Assessment:  Sharon Ward is a 27 y.o. female with a history of borderline personality disorder, multiple suicide attempts, MDD, PTSD who presents in person to [facility] for medication management.  Patient reports presenting in order to restart psychotropics. Last hospitalization was 04/04/23-04/12/23 for intentional overdose and discharged on Abilify Maintena 300 mg. She is presently refusing LAI but is open to being restarted on oral abilify and trazodone. There was significant discussion about the rationale of continuing Abilify LAI as opposed to switching back to oral including her high risk for suicide.  She continues to be reticent about the LAI but we will continue to explore this option.  Patient is at chronic high risk for suicide given her history of suicide attempts, impulsivity, personality disorder.  However, she also does have multiple protective factors including future oriented, care for family, and reduced access to lethal means (7 days of medication at a time).  There is some question whether she has bipolar disorder or her "hypomanic" episodes are secondary to her borderline personality disorder.  Referral for PHP has been made due to her requiring higher acuity of care, specifically therapy.   Plan:  # Borderline Personality Disorder Past medication trials:  Status of problem: active Interventions: -- recommend DBT -- referral PHP -- restart abilify 15 mg daily  --Encourage LAI   --Qtc 470 (04/12/23)  # Major Depressive Disorder # PTSD # rule out Bipolar Disorder Past medication trials:  Status of problem: active Interventions: -- Abilify as above -- trazodone 25-50 at bedtime prn  Return to care in 1 month if patient does not go to PHP.  Patient was given contact information for  behavioral health clinic and was instructed to call 911 for emergencies.    Patient and plan of care will be discussed with the Attending MD, Dr. Josephina Shih, who agrees with the above statement and plan.   Subjective:  Chief Complaint: Medication Management  History of Present Illness:   Sharon Ward present to re-establish with psychiatry due to needing to be on psychotropics. She was on Abilify LAI following discharge 04/12/23 with plan to follow up with [facility] but she was discharged from their service. She states they had failed "provide her with enough therapy or her medications". She reports that since she has been out of her psychotropics, she has become more anxiety and depression. She reports struggling with insomnia as well. She denies present SI/HI/AVH. She reports primary stressors have been related to children's father "falsifying charges" and needing to go to court for various charges.  She reports having a history of borderline personality disorder, GAD with panic, and MDD. She states she was diagnosed with bipolar disorder type 2 but symptoms of hypomania may be more accurately represented by exacerbations of BPD or GAD. She denies psychotic symptoms.   She reports history of trauma resulting in hypervigilance, flashbacks, and nightmares.   She reports currently smokes 0.5 ppd of cigarettes and cannabis twice a week for "sleep". She denies feeling dependent on any substance. She denies any other illicit substance use.     Past Psychiatric History:  Diagnoses: MDD, GAD, Borderline Personality Disorder Previous psychiatrist/therapist: endorses Hospitalizations: multiple for suicide Suicide attempts: Multiple attempts via overdose/strangulation SIB: endorses Hx of violence towards others: denies Current access to guns: denies Hx of trauma/abuse: endorses  Substance Abuse History in the last 65  months:  Yes.    Past Medical History:  Past Medical History:  Diagnosis Date    Asthma    Bipolar 1 disorder, mixed (HCC)    Depression    Generalized anxiety disorder    Intentional drug overdose (HCC) 11/03/2020   Low-lying placenta 01/07/2022   Resolved 03/04/22   Relationship dysfunction    Suicide attempt (HCC) 11/02/2020    Past Surgical History:  Procedure Laterality Date   PILONIDAL CYST / SINUS EXCISION  09/11/2013   PILONIDAL CYST EXCISION  05/17/2014   Pilonidal cystectomy with cleft lip    Family Psychiatric History: Grandmother diagnosed Bipolar. Grandfather is depressed.   Family History:  Family History  Problem Relation Age of Onset   Asthma Mother    Diabetes Mother    Healthy Mother    Hypertension Father    Asthma Father    Diabetes Father    Healthy Father    Asthma Brother    Hypertension Paternal Uncle    Diabetes Paternal Grandmother    Stroke Paternal Grandfather    Heart disease Paternal Grandfather    Hypertension Paternal Grandfather    Diabetes Paternal Grandfather     Social History:   Academic/Vocational: denies Social History   Socioeconomic History   Marital status: Single    Spouse name: Not on file   Number of children: 1   Years of education: 10   Highest education level: 10th grade  Occupational History   Not on file  Tobacco Use   Smoking status: Former    Current packs/day: 0.00    Average packs/day: 1 pack/day for 15.0 years (15.0 ttl pk-yrs)    Types: Cigarettes    Start date: 07/14/2006    Quit date: 07/14/2021    Years since quitting: 1.8   Smokeless tobacco: Never   Tobacco comments:    only smoke a "couple" of cigarettes when stressed or anxious, socially with friends per [facility] chart  Vaping Use   Vaping status: Former   Start date: 08/10/2003   Quit date: 05/04/2021  Substance and Sexual Activity   Alcohol use: Not Currently    Comment: occasional prior to pregnancy   Drug use: Not Currently    Frequency: 4.0 times per week    Types: Marijuana    Comment: No Delta 9 since February 2023    Sexual activity: Not Currently    Partners: Male    Birth control/protection: None  Other Topics Concern   Not on file  Social History Narrative   Not on file   Social Determinants of Health   Financial Resource Strain: Not on file  Food Insecurity: Patient Unable To Answer (04/04/2023)   Hunger Vital Sign    Worried About Running Out of Food in the Last Year: Patient unable to answer    Ran Out of Food in the Last Year: Patient unable to answer  Transportation Needs: Patient Unable To Answer (04/04/2023)   PRAPARE - Transportation    Lack of Transportation (Medical): Patient unable to answer    Lack of Transportation (Non-Medical): Patient unable to answer  Physical Activity: Not on file  Stress: Not on file  Social Connections: Not on file    Additional Social History: updated  Allergies:   Allergies  Allergen Reactions   Ascorbate Rash   Citrus Rash   Coconut Flavor Rash   Lamotrigine Rash   Latex Rash and Other (See Comments)    Skin turns red and breaks out where placed  Orange (Diagnostic) Rash   Peach Flavor Rash   Pear Rash   Pineapple Rash   Tape Rash and Other (See Comments)    Skin turns red and breaks out where placed    Current Medications: No current outpatient medications on file.   No current facility-administered medications for this visit.    ROS: Review of Systems   Objective:  Psychiatric Specialty Exam: not currently breastfeeding.There is no height or weight on file to calculate BMI.  General Appearance: Casual  Eye Contact:  Fair  Speech:  Clear and Coherent and Normal Rate  Volume:  Normal  Mood:  Anxious  Affect:  Appropriate and Congruent  Thought Content: Logical   Suicidal Thoughts:  No  Homicidal Thoughts:  No  Thought Process:  Coherent, Goal Directed, and Linear  Orientation:  Full (Time, Place, and Person)    Memory: Remote;   Fair  Judgment:  Poor  Insight:  Fair  Concentration:  Concentration: Fair  Recall:  not  formally assessed   Fund of Knowledge: Fair  Language: Fair  Psychomotor Activity:  Normal  Akathisia:  No  AIMS (if indicated): not done  Assets:  Communication Skills Desire for Improvement Financial Resources/Insurance Housing Leisure Time Physical Health Resilience Social Support Talents/Skills Transportation Vocational/Educational  ADL's:  Intact  Cognition: WNL  Sleep:  Fair   PE: General: well-appearing; no acute distress  Pulm: no increased work of breathing on room air  Strength & Muscle Tone: within normal limits Neuro: no focal neurological deficits observed  Gait & Station: normal  Metabolic Disorder Labs: Lab Results  Component Value Date   HGBA1C 5.2 03/20/2023   MPG 103 03/20/2023   MPG 103 10/19/2022   Lab Results  Component Value Date   PROLACTIN 48.1 (H) 11/10/2021   Lab Results  Component Value Date   CHOL 158 03/20/2023   TRIG 76 03/20/2023   HDL 53 03/20/2023   CHOLHDL 3.0 03/20/2023   VLDL 15 03/20/2023   LDLCALC 90 03/20/2023   LDLCALC 107 (H) 12/19/2022   Lab Results  Component Value Date   TSH 2.282 03/20/2023    Therapeutic Level Labs: No results found for: "LITHIUM" No results found for: "CBMZ" No results found for: "VALPROATE"  Screenings:  AIMS    Flowsheet Row [facility]) [facility] (Discharged[facility] Admission (Discharged) from OP Visit [facility] Ad[facility] (Discharged) [facility]  AIMS Total Score 0 0 0 0 0      AUDIT    Flowsheet Row [facility]) [facility] Ad[facility] (Discharged) [facility] Ad[facility] (Discharged) from [facility] Admission (Discharged) from [facility] Admission (Discharged) from [facility]  Alcohol Use Disorder Identification Test Final Score (AUDIT) 9 10 0 0 0      GAD-7    Flowsheet Row Office Visit from 09/16/2022 in [facility] [employer] [employer] [facility] Office Visit from 08/18/2022 in [facility] [employer] [facility] Routine Prenatal from 05/26/2022 in [facility] [employer] [facility] Routine Prenatal from  05/19/2022 in [facility] Routine Prenatal from 05/13/2022 in [facility]  Total GAD-7 Score 17 15 4 6 4       PHQ2-9    Flowsheet Row [facility]/16/2022 [facility] ED from 08/20/2022 in [facility] Office Visit from 08/18/2022 in [facility] Routine Prenatal from 05/26/2022 in [facility] at [facility] Routine Prenatal from 05/19/2022 in [facility] at [facility]  PHQ-2 Total Score 6 4 4  0 1  PHQ-9 Total Score 21 10 15 3 4       Flowsheet Row Admission (Discharged) from 05/02/2023 in [facility] ED to Hosp-Admission (Discharged) from 04/04/2023 in [place] [facility] 4TH FLOOR PROGRESSIVE CARE [facility] UROLOGY ED from 03/24/2023 in [facility] Emergency Department at [facility]  C-SSRS RISK CATEGORY High Risk High Risk High Risk       Collaboration of Care: Collaboration of Care:   Patient/Guardian was advised Release of Information must be obtained prior to any record release  in order to collaborate their care with an outside provider. Patient/Guardian was advised if they have not already done so to contact the registration department to sign all necessary forms in order for [place] to release information regarding their care.   Consent: Patient/Guardian gives verbal consent for treatment and assignment of benefits for services provided during this visit. Patient/Guardian expressed understanding and agreed to proceed.   Park Pope, MD 10/8/20248:24 AM

## 2023-05-19 ENCOUNTER — Encounter (HOSPITAL_COMMUNITY): Payer: Self-pay | Admitting: Student

## 2023-05-26 ENCOUNTER — Telehealth (HOSPITAL_COMMUNITY): Payer: Self-pay | Admitting: Student

## 2023-05-26 NOTE — Telephone Encounter (Signed)
Called patient to verify her interest in PHP. She reports continued interest in going to [facility] and stated she might have missed the phone call. I provided her with the callback number provided by Milana Na [phone] to reach out to. No safety concerns. I discussed that if she chooses to not attend PHP that I would recommend following up in 1-2 weeks for follow up. She was amenable to this.  -Park Pope, MD

## 2023-06-14 ENCOUNTER — Encounter (HOSPITAL_COMMUNITY): Payer: No Payment, Other | Admitting: Student

## 2023-06-19 ENCOUNTER — Emergency Department (HOSPITAL_COMMUNITY)
Admission: EM | Admit: 2023-06-19 | Discharge: 2023-06-19 | Disposition: A | Discharge status: Home / Self Care | Payer: MEDICAID | Attending: Emergency Medicine | Admitting: Emergency Medicine

## 2023-06-19 ENCOUNTER — Emergency Department (HOSPITAL_COMMUNITY): Payer: MEDICAID

## 2023-06-19 ENCOUNTER — Other Ambulatory Visit: Payer: Self-pay

## 2023-06-19 ENCOUNTER — Encounter (HOSPITAL_COMMUNITY): Payer: Self-pay

## 2023-06-19 DIAGNOSIS — R1031 Right lower quadrant pain: Secondary | ICD-10-CM | POA: Insufficient documentation

## 2023-06-19 DIAGNOSIS — Z91040 Latex allergy status: Secondary | ICD-10-CM | POA: Insufficient documentation

## 2023-06-19 DIAGNOSIS — J45909 Unspecified asthma, uncomplicated: Secondary | ICD-10-CM | POA: Diagnosis not present

## 2023-06-19 DIAGNOSIS — N12 Tubulo-interstitial nephritis, not specified as acute or chronic: Secondary | ICD-10-CM

## 2023-06-19 LAB — CBC
HCT: 38.3 % (ref 36.0–46.0)
Hemoglobin: 12.5 g/dL (ref 12.0–15.0)
MCH: 29.1 pg (ref 26.0–34.0)
MCHC: 32.6 g/dL (ref 30.0–36.0)
MCV: 89.3 fL (ref 80.0–100.0)
Platelets: 244 10*3/uL (ref 150–400)
RBC: 4.29 MIL/uL (ref 3.87–5.11)
RDW: 13.2 % (ref 11.5–15.5)
WBC: 4.5 10*3/uL (ref 4.0–10.5)
nRBC: 0 % (ref 0.0–0.2)

## 2023-06-19 LAB — COMPREHENSIVE METABOLIC PANEL
ALT: 11 U/L (ref 0–44)
AST: 14 U/L — ABNORMAL LOW (ref 15–41)
Albumin: 3.9 g/dL (ref 3.5–5.0)
Alkaline Phosphatase: 50 U/L (ref 38–126)
Anion gap: 8 (ref 5–15)
BUN: 8 mg/dL (ref 6–20)
CO2: 23 mmol/L (ref 22–32)
Calcium: 9.5 mg/dL (ref 8.9–10.3)
Chloride: 108 mmol/L (ref 98–111)
Creatinine, Ser: 0.8 mg/dL (ref 0.44–1.00)
GFR, Estimated: >60 mL/min (ref >60)
Glucose, Bld: 102 mg/dL — ABNORMAL HIGH (ref 70–99)
Potassium: 3.9 mmol/L (ref 3.5–5.1)
Sodium: 139 mmol/L (ref 135–145)
Total Bilirubin: 0.4 mg/dL (ref <1.2)
Total Protein: 7.1 g/dL (ref 6.5–8.1)

## 2023-06-19 LAB — URINALYSIS, ROUTINE W REFLEX MICROSCOPIC
Bilirubin Urine: NEGATIVE
Glucose, UA: NEGATIVE mg/dL
Ketones, ur: NEGATIVE mg/dL
Nitrite: NEGATIVE
Protein, ur: NEGATIVE mg/dL
Specific Gravity, Urine: 1.018 (ref 1.005–1.030)
WBC, UA: >50 WBC/hpf (ref 0–5)
pH: 6 (ref 5.0–8.0)

## 2023-06-19 LAB — LIPASE, BLOOD: Lipase: 30 U/L (ref 11–51)

## 2023-06-19 LAB — HCG, SERUM, QUALITATIVE: Preg, Serum: NEGATIVE

## 2023-06-19 MED ORDER — SULFAMETHOXAZOLE-TRIMETHOPRIM 800-160 MG PO TABS
1.0000 | ORAL_TABLET | Freq: Two times a day (BID) | ORAL | 0 refills | Status: AC
Start: 2023-06-19 — End: 2023-06-29

## 2023-06-19 MED ORDER — IOHEXOL 350 MG/ML SOLN
75.0000 mL | Freq: Once | INTRAVENOUS | Status: AC | PRN
  Administered 2023-06-19: 75 mL via INTRAVENOUS

## 2023-06-19 NOTE — ED Notes (Signed)
Pt left wo notifying nursing staff. PA made aware. Was not able to get dc vitals d/t that reason.

## 2023-06-19 NOTE — Discharge Instructions (Signed)
Thank you for allowing [place] to be a part of your care today.  Your workup is concerning for pyelonephritis.  This is a urinary tract infection that spreads to your kidneys.  I have sent an antibiotic to the pharmacy to treat this.  You will take this medication for the next 10 days.  Take the entire course of medication even if your symptoms begin to improve.    Increase your fluid intake to help flush out your kidneys and bladder.  Take Tylenol and/or ibuprofen as needed for pain.   Return to the ED if you develop sudden worsening of symptoms, have fever >100.80F, blood in your urine, difficulty urinating, or if you have new concerns.

## 2023-06-19 NOTE — ED Provider Notes (Signed)
[place] EMERGENCY DEPARTMENT AT [facility] Provider Note   CSN: [id number] Arrival date & time: 06/19/23  0815     History  Chief Complaint  Patient presents with   Abdominal Pain    Sharon Ward is a 27 y.o. female with past medical history significant for asthma, depression, GAD, bipolar 1 disorder presents to the ED complaining of right lower quadrant abdominal pain for the past few days.  She states the pain is worse when she moves around, coughs, or sneezes.  Pain does radiate to her back and pelvic region.  Denies nausea, vomiting, diarrhea, constipation, fever, urinary symptoms, vaginal discharge or bleeding.         Home Medications Prior to Admission medications   Medication Sig Start Date End Date Taking? Authorizing Provider  ARIPiprazole (ABILIFY) 15 MG tablet Take 1 tablet (15 mg total) by mouth daily for 28 days. 05/17/23 06/14/23  Park Pope, MD  traZODone (DESYREL) 50 MG tablet Take 0.5-1 tablets (25-50 mg total) by mouth at bedtime for 28 days. 05/17/23 06/14/23  Park Pope, MD      Allergies    Ascorbate, Citrus, Coconut flavor, Lamotrigine, Latex, Orange (diagnostic), Peach flavor, Pear, Pineapple, and Tape    Review of Systems   Review of Systems  Constitutional:  Negative for fever.  Gastrointestinal:  Positive for abdominal pain. Negative for diarrhea, nausea and vomiting.  Genitourinary:  Positive for flank pain and pelvic pain. Negative for decreased urine volume, dysuria, frequency, hematuria, urgency, vaginal bleeding and vaginal discharge.  Musculoskeletal:  Positive for back pain.    Physical Exam Updated Vital Signs BP 109/64   Pulse 65   Temp 97.7 F (36.5 C)   Resp 17   Ht 5\' 3"  (1.6 m)   Wt 89.8 kg   LMP 05/26/2023   SpO2 100%   BMI 35.07 kg/m  Physical Exam Vitals and nursing note reviewed.  Constitutional:      General: She is not in acute distress.    Appearance: She is not ill-appearing.  HENT:      Mouth/Throat:     Mouth: Mucous membranes are moist.     Pharynx: Oropharynx is clear.  Cardiovascular:     Rate and Rhythm: Normal rate and regular rhythm.     Pulses: Normal pulses.  Pulmonary:     Effort: Pulmonary effort is normal. No respiratory distress.     Breath sounds: Normal breath sounds and air entry.  Abdominal:     General: Abdomen is flat. Bowel sounds are normal. There is no distension.     Palpations: Abdomen is soft.     Tenderness: There is abdominal tenderness in the right lower quadrant. There is right CVA tenderness. There is no left CVA tenderness, guarding or rebound. Positive signs include McBurney's sign.     Comments: Tenderness at McBurney's point.  No rebound tenderness or evidence of peritonitis.    Skin:    General: Skin is warm and dry.     Capillary Refill: Capillary refill takes less than 2 seconds.  Neurological:     Mental Status: She is alert. Mental status is at baseline.  Psychiatric:        Mood and Affect: Mood normal.        Behavior: Behavior normal.     ED Results / Procedures / Treatments   Labs (all labs ordered are listed, but only abnormal results are displayed) Labs Reviewed  COMPREHENSIVE METABOLIC PANEL - Abnormal; Notable for the  following components:      Result Value   Glucose, Bld 102 (*)    AST 14 (*)    All other components within normal limits  URINALYSIS, ROUTINE W REFLEX MICROSCOPIC - Abnormal; Notable for the following components:   APPearance CLOUDY (*)    Hgb urine dipstick SMALL (*)    Leukocytes,Ua LARGE (*)    Bacteria, UA RARE (*)    All other components within normal limits  LIPASE, BLOOD  CBC  HCG, SERUM, QUALITATIVE    EKG None  Radiology No results found.  Procedures Procedures    Medications Ordered in ED Medications - No data to display  ED Course/ Medical Decision Making/ A&P                                 Medical Decision Making Amount and/or Complexity of Data Reviewed Labs:  ordered. Radiology: ordered.  Risk Prescription drug management.   This patient presents to the ED with chief complaint(s) of RLQ abdominal pain, back pain, pelvic pain with non-contributory past medical history. The complaint involves an extensive differential diagnosis and also carries with it a high risk of complications and morbidity.    The differential diagnosis includes appendicitis, kidney stone, UTI, pyelonephritis, diverticulitis   The initial plan is to obtain labs, UA  Initial Assessment:   Exam significant for overall well-appearing patient who is not in acute distress.  Abdomen is soft with RLQ tenderness at McBurney's point.  No rebound tenderness or evidence of peritonitis.  No guarding.  Right CVA tenderness also present.   Independent ECG/labs interpretation:  The following labs were independently interpreted:  CBC without leukocytosis or anemia.  Metabolic panel with normal hepatic and renal function, no major electrolyte disturbance.  Pregnancy negative.  UA with large amount of leukocytes.  Rare bacteria and small microscopic Hgb present.  Large amount of squamous cells present.  Independent visualization and interpretation of imaging: I independently visualized the following imaging with scope of interpretation limited to determining acute life threatening conditions related to emergency care: CT abdomen and pelvis, which revealed no acute process to explain patients symptoms, no pancreatitis.   Disposition:   Suspect patient's symptoms may be related to UTI, possibly even early pyelonephritis despite no CT evidence of perinephric stranding.  Will treat patient with antibiotics to cover for pyelonephritis.  Urine sent for culture and patient advised she will be notified if treatment needs to be altered based on these results.  The patient has been appropriately medically screened and/or stabilized in the ED. I have low suspicion for any other emergent medical  condition which would require further screening, evaluation or treatment in the ED or require inpatient management. At time of discharge the patient is hemodynamically stable and in no acute distress. I have discussed work-up results and diagnosis with patient and answered all questions. Patient is agreeable with discharge plan. We discussed strict return precautions for returning to the emergency department and they verbalized understanding.             Final Clinical Impression(s) / ED Diagnoses Final diagnoses:  None    Rx / DC Orders ED Discharge Orders     None         Lenard Simmer, PA-C 06/19/23 1431    Maia Plan, MD 06/20/23 [phone]

## 2023-06-19 NOTE — ED Triage Notes (Signed)
Reports pain in RLQ/suprapubic region for last few days. Reports pain is worse with walking or coughing or sneezing.  Patient denies n/v/d

## 2023-06-20 LAB — URINE CULTURE: Culture: 50000 — AB

## 2023-06-20 NOTE — Telephone Encounter (Signed)
error 

## 2023-06-21 ENCOUNTER — Telehealth (HOSPITAL_BASED_OUTPATIENT_CLINIC_OR_DEPARTMENT_OTHER): Payer: Self-pay | Admitting: *Deleted

## 2023-06-21 NOTE — Telephone Encounter (Signed)
Post ED Visit - Positive Culture Follow-up  Culture report reviewed by antimicrobial stewardship pharmacist: [facility] [x]  Enos Fling, Pharm.D. []  Celedonio Miyamoto, Pharm.D., BCPS AQ-ID []  Garvin Fila, Pharm.D., BCPS []  Georgina Pillion, Pharm.D., BCPS []  Katherine, [place].D., BCPS, AAHIVP []  Estella Husk, Pharm.D., BCPS, AAHIVP []  Lysle Pearl, PharmD, BCPS []  Phillips Climes, PharmD, BCPS []  Agapito Games, PharmD, BCPS []  Verlan Friends, PharmD []  Mervyn Gay, PharmD, BCPS []  Vinnie Level, PharmD  [facility] []  Len Childs, PharmD []  Greer Pickerel, PharmD []  Adalberto Cole, PharmD []  Perlie Gold, Rph []  Lonell Face) Jean Rosenthal, PharmD []  Earl Many, PharmD []  Junita Push, PharmD []  Dorna Leitz, PharmD []  Terrilee Files, PharmD []  Lynann Beaver, PharmD []  Keturah Barre, PharmD []  Loralee Pacas, PharmD []  Bernadene Person, PharmD   Positive urine culture Treated with sulfamethoxazole-trimethoprim, organism sensitive to the same and no further patient follow-up is required at this time.  Nena Polio Garner Nash 06/21/2023, 11:21 AM

## 2023-06-27 ENCOUNTER — Encounter: Payer: Self-pay | Admitting: Internal Medicine

## 2023-06-27 ENCOUNTER — Ambulatory Visit (INDEPENDENT_AMBULATORY_CARE_PROVIDER_SITE_OTHER): Payer: MEDICAID | Admitting: Internal Medicine

## 2023-06-27 VITALS — BP 120/72 | HR 60 | Temp 99.2°F | Ht 63.0 in | Wt 185.0 lb

## 2023-06-27 DIAGNOSIS — F603 Borderline personality disorder: Secondary | ICD-10-CM | POA: Diagnosis not present

## 2023-06-27 DIAGNOSIS — N12 Tubulo-interstitial nephritis, not specified as acute or chronic: Secondary | ICD-10-CM

## 2023-06-27 DIAGNOSIS — Z0001 Encounter for general adult medical examination with abnormal findings: Secondary | ICD-10-CM | POA: Insufficient documentation

## 2023-06-27 HISTORY — DX: Tubulo-interstitial nephritis, not specified as acute or chronic: N12

## 2023-06-27 NOTE — Assessment & Plan Note (Signed)
Age and sex appropriate education and counseling updated with regular exercise and diet Referrals for preventative services - none needed Immunizations addressed - declines flu shot and covid booster (has not had nay in past) Smoking counseling  - none needed Evidence for depression or other mood disorder - stable overall , plans to f/u psychiatry Most recent labs reviewed. I have personally reviewed and have noted: 1) the patient's medical and social history 2) The patient's current medications and supplements 3) The patient's height, weight, and BMI have been recorded in the chart

## 2023-06-27 NOTE — Patient Instructions (Signed)
Ok to finish your current antibiotic  Please continue all other medications as before, and refills have been done if requested.  Please have the pharmacy call with any other refills you may need.  Please continue your efforts at being more active, low cholesterol diet, and weight control.  You are otherwise up to date with prevention measures today.  Please keep your appointments with your specialists as you may have planned  Please make an Appointment to return for your 1 year visit, or sooner if needed

## 2023-06-27 NOTE — Progress Notes (Signed)
Patient ID: Sharon Ward, female   DOB: Feb 17, 1996, 27 y.o.   MRN: [id number]         Chief Complaint:: wellness exam and recent pyelonephritis UTI nov 10, asthma, borderline personality d/o       HPI:  Sharon Ward is a 27 y.o. female here for wellness exam; declines flu shot and covid booster, o/w up to date                        Also sees psychiatry - prob borderline personality d/o , less likely bipolar. Pt denies chest pain, increased sob or doe, wheezing, orthopnea, PND, increased LE swelling, palpitations, dizziness or syncope.   Pt denies polydipsia, polyuria, or new focal neuro s/s.    Pt denies fever, night sweats, loss of appetite, or other constitutional symptoms, but has lost wt intentionally over 15 lbs.   Denies urinary symptoms such as dysuria, frequency, urgency, flank pain, hematuria or n/v, fever, chills.      Wt Readings from Last 3 Encounters:  06/27/23 185 lb (83.9 kg)  06/19/23 198 lb (89.8 kg)  05/02/23 198 lb 14.4 oz (90.2 kg)   BP Readings from Last 3 Encounters:  06/27/23 120/72  06/19/23 109/64  04/12/23 (!) 96/50   Immunization History  Administered Date(s) Administered   DTaP 12/19/1997, 03/26/1998, 12/10/1998, 12/18/1999   DTaP / Hep B / IPV 09/26/2001   Dtap, Unspecified 09/26/2001   HIB (PRP-OMP) 12/19/1997, 03/26/1998, 12/10/1998   HPV Quadrivalent 11/22/2008, 02/04/2009, 01/19/2012   Hepatitis A, Ped/Adol-2 Dose 10/06/2007, 01/19/2012   Hepatitis B November 29, 1997, 12/19/1997, 12/10/1998, 06/12/2009, 07/16/2009   IPV 12/19/1997, 03/26/1998, 12/10/1998   Influenza Nasal 06/09/2009   Influenza Split 09/23/2005, 06/14/2006, 10/06/2007, 05/07/2009   Influenza,inj,Quad PF,6+ Mos 04/25/2017   Influenza-Unspecified 04/25/2017   MMR 12/10/1998, 09/26/2001, 01/16/2020   Meningococcal Conjugate 11/21/2008, 02/01/2018   PPD Test 02/01/2018, 03/28/2018   Tdap 11/22/2008, 10/25/2019, 03/18/2022   Varicella 12/10/1998, 10/06/2007   There are no  preventive care reminders to display for this patient.     Past Medical History:  Diagnosis Date   Asthma    Bipolar 1 disorder, mixed (HCC)    Depression    Generalized anxiety disorder    Intentional drug overdose (HCC) 11/03/2020   Low-lying placenta 01/07/2022   Resolved 03/04/22   Relationship dysfunction    Suicide attempt (HCC) 11/02/2020   Past Surgical History:  Procedure Laterality Date   PILONIDAL CYST / SINUS EXCISION  09/11/2013   PILONIDAL CYST EXCISION  05/17/2014   Pilonidal cystectomy with cleft lip    reports that she quit smoking about 1 years ago. Her smoking use included cigarettes. She started smoking about 16 years ago. She has a 15 pack-year smoking history. She has never used smokeless tobacco. She reports that she does not currently use alcohol. She reports that she does not currently use drugs after having used the following drugs: Marijuana. Frequency: 4.00 times per week. family history includes Asthma in her brother, father, and mother; Diabetes in her father, mother, paternal grandfather, and paternal grandmother; Healthy in her father and mother; Heart disease in her paternal grandfather; Hypertension in her father, paternal grandfather, and paternal uncle; Stroke in her paternal grandfather. Allergies  Allergen Reactions   Ascorbate Rash   Citrus Rash   Coconut Flavor Rash   Lamotrigine Rash   Latex Rash and Other (See Comments)    Skin turns red and breaks out where placed   [facility] (  Diagnostic) Rash   Peach Flavor Rash   Pear Rash   Pineapple Rash   Tape Rash and Other (See Comments)    Skin turns red and breaks out where placed   Current Outpatient Medications on File Prior to Visit  Medication Sig Dispense Refill   sulfamethoxazole-trimethoprim (BACTRIM DS) 800-160 MG tablet Take 1 tablet by mouth 2 (two) times daily for 10 days. 20 tablet 0   ARIPiprazole (ABILIFY) 15 MG tablet Take 1 tablet (15 mg total) by mouth daily for 28 days. 7  tablet 3   traZODone (DESYREL) 50 MG tablet Take 0.5-1 tablets (25-50 mg total) by mouth at bedtime for 28 days. 7 tablet 3   No current facility-administered medications on file prior to visit.        ROS:  All others reviewed and negative.  Objective        PE:  BP 120/72 (BP Location: Right Arm, Patient Position: Sitting, Cuff Size: Normal)   Pulse 60   Temp 99.2 F (37.3 C) (Oral)   Ht 5\' 3"  (1.6 m)   Wt 185 lb (83.9 kg)   LMP 06/24/2023 (Exact Date)   SpO2 98%   Breastfeeding Unknown   BMI 32.77 kg/m                 Constitutional: Pt appears in NAD               HENT: Head: NCAT.                Right Ear: External ear normal.                 Left Ear: External ear normal.                Eyes: . Pupils are equal, round, and reactive to light. Conjunctivae and EOM are normal               Nose: without d/c or deformity               Neck: Neck supple. Gross normal ROM               Cardiovascular: Normal rate and regular rhythm.                 Pulmonary/Chest: Effort normal and breath sounds without rales or wheezing.                Abd:  Soft, NT, ND, + BS, no organomegaly               Neurological: Pt is alert. At baseline orientation, motor grossly intact               Skin: Skin is warm. No rashes, no other new lesions, LE edema - none               Psychiatric: Pt behavior is normal without agitation   Micro: none  Cardiac tracings I have personally interpreted today:  none  Pertinent Radiological findings (summarize): none   Lab Results  Component Value Date   WBC 4.5 06/19/2023   HGB 12.5 06/19/2023   HCT 38.3 06/19/2023   PLT 244 06/19/2023   GLUCOSE 102 (H) 06/19/2023   CHOL 158 03/20/2023   TRIG 76 03/20/2023   HDL 53 03/20/2023   LDLCALC 90 03/20/2023   ALT 11 06/19/2023   AST 14 (L) 06/19/2023   NA 139 06/19/2023   K 3.9 06/19/2023   CL  108 06/19/2023   CREATININE 0.80 06/19/2023   BUN 8 06/19/2023   CO2 23 06/19/2023   TSH 2.282  03/20/2023   HGBA1C 5.2 03/20/2023   Assessment/Plan:  Sharon Ward is a 27 y.o. White or Caucasian [1] female with  has a past medical history of Asthma, Bipolar 1 disorder, mixed (HCC), Depression, Generalized anxiety disorder, Intentional drug overdose (HCC) (11/03/2020), Low-lying placenta (01/07/2022), Relationship dysfunction, and Suicide attempt (HCC) (11/02/2020).  Pyelonephritis Clinically resolved, to finish antibx,  to f/u any worsening symptoms or concerns  Borderline personality disorder (HCC) Stable, pt to continue psychiatry follow up  Encounter for well adult exam with abnormal findings Age and sex appropriate education and counseling updated with regular exercise and diet Referrals for preventative services - none needed Immunizations addressed - declines flu shot and covid booster (has not had nay in past) Smoking counseling  - none needed Evidence for depression or other mood disorder - stable overall , plans to f/u psychiatry Most recent labs reviewed. I have personally reviewed and have noted: 1) the patient's medical and social history 2) The patient's current medications and supplements 3) The patient's height, weight, and BMI have been recorded in the chart  Followup: Return in about 1 year (around 06/26/2024).  Oliver Barre, MD 06/27/2023 10:02 PM [facility] Internal Medicine

## 2023-06-27 NOTE — Assessment & Plan Note (Signed)
Clinically resolved, to finish antibx,  to f/u any worsening symptoms or concerns

## 2023-06-27 NOTE — Assessment & Plan Note (Signed)
Stable, pt to continue psychiatry follow up

## 2023-07-02 ENCOUNTER — Emergency Department (HOSPITAL_COMMUNITY)
Admission: EM | Admit: 2023-07-02 | Discharge: 2023-07-05 | Disposition: A | Discharge status: Other Acute Inpatient Hospital | Payer: MEDICAID | Attending: Emergency Medicine | Admitting: Emergency Medicine

## 2023-07-02 DIAGNOSIS — J45909 Unspecified asthma, uncomplicated: Secondary | ICD-10-CM | POA: Diagnosis not present

## 2023-07-02 DIAGNOSIS — F3163 Bipolar disorder, current episode mixed, severe, without psychotic features: Secondary | ICD-10-CM | POA: Insufficient documentation

## 2023-07-02 DIAGNOSIS — E860 Dehydration: Secondary | ICD-10-CM | POA: Diagnosis not present

## 2023-07-02 DIAGNOSIS — F603 Borderline personality disorder: Secondary | ICD-10-CM | POA: Diagnosis not present

## 2023-07-02 DIAGNOSIS — F332 Major depressive disorder, recurrent severe without psychotic features: Secondary | ICD-10-CM

## 2023-07-02 DIAGNOSIS — R4589 Other symptoms and signs involving emotional state: Secondary | ICD-10-CM | POA: Insufficient documentation

## 2023-07-02 DIAGNOSIS — R9431 Abnormal electrocardiogram [ECG] [EKG]: Secondary | ICD-10-CM | POA: Diagnosis present

## 2023-07-02 DIAGNOSIS — R45851 Suicidal ideations: Secondary | ICD-10-CM | POA: Insufficient documentation

## 2023-07-02 DIAGNOSIS — I4581 Long QT syndrome: Secondary | ICD-10-CM | POA: Insufficient documentation

## 2023-07-02 DIAGNOSIS — Z9151 Personal history of suicidal behavior: Secondary | ICD-10-CM

## 2023-07-02 LAB — CBC WITH DIFFERENTIAL/PLATELET
Abs Immature Granulocytes: 0.04 10*3/uL (ref 0.00–0.07)
Basophils Absolute: 0.1 10*3/uL (ref 0.0–0.1)
Basophils Relative: 1 %
Eosinophils Absolute: 0 10*3/uL (ref 0.0–0.5)
Eosinophils Relative: 0 %
HCT: 36.3 % (ref 36.0–46.0)
Hemoglobin: 12.2 g/dL (ref 12.0–15.0)
Immature Granulocytes: 0 %
Lymphocytes Relative: 16 %
Lymphs Abs: 1.6 10*3/uL (ref 0.7–4.0)
MCH: 29.3 pg (ref 26.0–34.0)
MCHC: 33.6 g/dL (ref 30.0–36.0)
MCV: 87.3 fL (ref 80.0–100.0)
Monocytes Absolute: 0.8 10*3/uL (ref 0.1–1.0)
Monocytes Relative: 9 %
Neutro Abs: 7.4 10*3/uL (ref 1.7–7.7)
Neutrophils Relative %: 74 %
Platelets: 272 10*3/uL (ref 150–400)
RBC: 4.16 MIL/uL (ref 3.87–5.11)
RDW: 13.1 % (ref 11.5–15.5)
WBC: 9.9 10*3/uL (ref 4.0–10.5)
nRBC: 0 % (ref 0.0–0.2)

## 2023-07-02 LAB — COMPREHENSIVE METABOLIC PANEL
ALT: 16 U/L (ref 0–44)
AST: 28 U/L (ref 15–41)
Albumin: 4.5 g/dL (ref 3.5–5.0)
Alkaline Phosphatase: 54 U/L (ref 38–126)
Anion gap: 16 — ABNORMAL HIGH (ref 5–15)
BUN: 11 mg/dL (ref 6–20)
CO2: 17 mmol/L — ABNORMAL LOW (ref 22–32)
Calcium: 9.8 mg/dL (ref 8.9–10.3)
Chloride: 103 mmol/L (ref 98–111)
Creatinine, Ser: 1.42 mg/dL — ABNORMAL HIGH (ref 0.44–1.00)
GFR, Estimated: 53 mL/min — ABNORMAL LOW (ref >60)
Glucose, Bld: 71 mg/dL (ref 70–99)
Potassium: 3.6 mmol/L (ref 3.5–5.1)
Sodium: 136 mmol/L (ref 135–145)
Total Bilirubin: 0.8 mg/dL (ref <1.2)
Total Protein: 7.2 g/dL (ref 6.5–8.1)

## 2023-07-02 LAB — SALICYLATE LEVEL: Salicylate Lvl: <7 mg/dL — ABNORMAL LOW (ref 7.0–30.0)

## 2023-07-02 LAB — ACETAMINOPHEN LEVEL: Acetaminophen (Tylenol), Serum: <10 ug/mL — ABNORMAL LOW (ref 10–30)

## 2023-07-02 LAB — ETHANOL: Alcohol, Ethyl (B): <10 mg/dL (ref <10)

## 2023-07-02 MED ORDER — LORAZEPAM 2 MG/ML IJ SOLN: 2.0000 mg | Freq: Once | INTRAMUSCULAR | Status: AC

## 2023-07-02 MED ORDER — LORAZEPAM 2 MG/ML IJ SOLN
INTRAMUSCULAR | Status: AC
  Administered 2023-07-02: 2 mg via INTRAMUSCULAR
  Filled 2023-07-02: qty 1

## 2023-07-02 MED ORDER — HALOPERIDOL LACTATE 5 MG/ML IJ SOLN
INTRAMUSCULAR | Status: AC
  Administered 2023-07-02: 5 mg via INTRAMUSCULAR
  Filled 2023-07-02: qty 1

## 2023-07-02 MED ORDER — HALOPERIDOL LACTATE 5 MG/ML IJ SOLN: 5.0000 mg | Freq: Once | INTRAMUSCULAR | Status: AC

## 2023-07-02 MED ORDER — SODIUM CHLORIDE 0.9 % IV BOLUS
1000.0000 mL | Freq: Once | INTRAVENOUS | Status: AC
  Administered 2023-07-03: 1000 mL via INTRAVENOUS

## 2023-07-02 MED ORDER — DIPHENHYDRAMINE HCL 50 MG/ML IJ SOLN: 50.0000 mg | Freq: Once | INTRAMUSCULAR | Status: AC

## 2023-07-02 MED ORDER — DIPHENHYDRAMINE HCL 50 MG/ML IJ SOLN
INTRAMUSCULAR | Status: AC
  Filled 2023-07-02: qty 1

## 2023-07-02 NOTE — ED Notes (Signed)
Pt was given a fan to cool off and water as well.

## 2023-07-02 NOTE — ED Triage Notes (Signed)
Pt BIB EMS. IVC.  PD reports pt found on bridge, told PD she wanted to jump off "balcony." Pt very agitated on arrival and screaming.

## 2023-07-02 NOTE — ED Provider Notes (Signed)
[place] EMERGENCY DEPARTMENT AT [facility] Provider Note   CSN: [id number] Arrival date & time: 07/02/23  2206     History {Add pertinent medical, surgical, social history, OB history to HPI:1} No chief complaint on file.  LEVEL 5 CAVEAT 2/2 ACUITY OF CONDITION; PSYCH D/O  Sharon Ward is a 27 y.o. female.  27 year old female presents to the emergency department in [employer] custody.  Police took out [employer] after patient was found on a bridge and exclaimed that she wanted to jump off of a "balcony".  She arrived extremely agitated at the department, screaming and yelling.  Required medications for sedation as well as restraints.  She will not expand on the events that occurred prior to police involvement.  She does have a history of multiple suicide attempts in the past as well as depression.  The history is provided by the police and medical records. No language interpreter was used.       Home Medications Prior to Admission medications   Medication Sig Start Date End Date Taking? Authorizing Provider  ARIPiprazole (ABILIFY) 15 MG tablet Take 1 tablet (15 mg total) by mouth daily for 28 days. 05/17/23 06/14/23  Park Pope, MD  traZODone (DESYREL) 50 MG tablet Take 0.5-1 tablets (25-50 mg total) by mouth at bedtime for 28 days. 05/17/23 06/14/23  Park Pope, MD      Allergies    Ascorbate, Citrus, Coconut flavor, Lamotrigine, Latex, Orange (diagnostic), Peach flavor, Pear, Pineapple, and Tape    Review of Systems   Review of Systems  Unable to perform ROS: Acuity of condition    Physical Exam Updated Vital Signs BP 120/63 (BP Location: Right Arm)   Pulse 77   Temp 97.8 F (36.6 C) (Oral)   Resp 16   LMP 06/24/2023 (Exact Date)   SpO2 96%   Physical Exam Vitals and nursing note reviewed.  Constitutional:      Appearance: She is well-developed.     Comments: Angry, agitated, yelling and cursing at staff  HENT:     Head: Normocephalic and atraumatic.   Eyes:     General: No scleral icterus.    Extraocular Movements: EOM normal.     Conjunctiva/sclera: Conjunctivae normal.  Pulmonary:     Effort: Pulmonary effort is normal. No respiratory distress.     Comments: Respirations even and unlabored Musculoskeletal:        General: Normal range of motion.     Cervical back: Normal range of motion.  Skin:    General: Skin is warm and dry.     Coloration: Skin is not pale.     Findings: No erythema or rash.  Neurological:     Mental Status: She is alert and oriented to person, place, and time.     Comments: Moving all extremities spontaneously.  Psychiatric:        Mood and Affect: Affect is angry.        Speech: Speech is rapid and pressured.        Behavior: Behavior is agitated and aggressive.     ED Results / Procedures / Treatments   Labs (all labs ordered are listed, but only abnormal results are displayed) Labs Reviewed  COMPREHENSIVE METABOLIC PANEL - Abnormal; Notable for the following components:      Result Value   CO2 17 (*)    Creatinine, Ser 1.42 (*)    GFR, Estimated 53 (*)    Anion gap 16 (*)    All  other components within normal limits  ACETAMINOPHEN LEVEL - Abnormal; Notable for the following components:   Acetaminophen (Tylenol), Serum <10 (*)    All other components within normal limits  SALICYLATE LEVEL - Abnormal; Notable for the following components:   Salicylate Lvl <7.0 (*)    All other components within normal limits  CBC WITH DIFFERENTIAL/PLATELET  ETHANOL  RAPID URINE DRUG SCREEN, HOSP PERFORMED  HCG, SERUM, QUALITATIVE    EKG None  Radiology No results found.  Procedures Procedures  {Document cardiac monitor, telemetry assessment procedure when appropriate:1}  Medications Ordered in ED Medications  diphenhydrAMINE (BENADRYL) injection 50 mg (has no administration in time range)  sodium chloride 0.9 % bolus 1,000 mL (has no administration in time range)  haloperidol lactate (HALDOL)  injection 5 mg (5 mg Intramuscular Given 07/02/23 2217)  LORazepam (ATIVAN) injection 2 mg (2 mg Intramuscular Given 07/02/23 2217)  diphenhydrAMINE (BENADRYL) 50 MG/ML injection ( Intramuscular Given 07/02/23 2227)    ED Course/ Medical Decision Making/ A&P Clinical Course as of 07/02/23 2343  Sat Jul 02, 2023  2321 Patient much more calm since medications. First Exam completed for IVC. [KH]  2342 Creatinine(!): 1.42 IV fluid ordered for presumed dehydration, AKI.  This is likely contributing to low bicarb and mildly elevated anion gap. [KH]    Clinical Course User Index [KH] Antony Madura, PA-C   {   Click here for ABCD2, HEART and other calculatorsREFRESH Note before signing :1}                              Medical Decision Making Amount and/or Complexity of Data Reviewed Labs: ordered. Decision-making details documented in ED Course.  Risk Prescription drug management.   ***  {Document critical care time when appropriate:1} {Document review of labs and clinical decision tools ie heart score, Chads2Vasc2 etc:1}  {Document your independent review of radiology images, and any outside records:1} {Document your discussion with family members, caretakers, and with consultants:1} {Document social determinants of health affecting pt's care:1} {Document your decision making why or why not admission, treatments were needed:1} Final Clinical Impression(s) / ED Diagnoses Final diagnoses:  None    Rx / DC Orders ED Discharge Orders     None

## 2023-07-03 ENCOUNTER — Encounter (HOSPITAL_COMMUNITY): Payer: Self-pay

## 2023-07-03 ENCOUNTER — Other Ambulatory Visit: Payer: Self-pay

## 2023-07-03 LAB — HCG, SERUM, QUALITATIVE: Preg, Serum: NEGATIVE

## 2023-07-03 MED ORDER — LORAZEPAM 2 MG/ML IJ SOLN
1.0000 mg | Freq: Once | INTRAMUSCULAR | Status: AC
  Administered 2023-07-03: 1 mg via INTRAMUSCULAR
  Filled 2023-07-03: qty 1

## 2023-07-03 MED ORDER — DIPHENHYDRAMINE HCL 50 MG/ML IJ SOLN
25.0000 mg | Freq: Once | INTRAMUSCULAR | Status: AC
  Administered 2023-07-03: 25 mg via INTRAVENOUS

## 2023-07-03 MED ORDER — LORAZEPAM 2 MG/ML IJ SOLN
2.0000 mg | Freq: Four times a day (QID) | INTRAMUSCULAR | Status: DC | PRN
  Administered 2023-07-04 – 2023-07-05 (×2): 2 mg via INTRAMUSCULAR
  Filled 2023-07-03 (×2): qty 1

## 2023-07-03 MED ORDER — LORAZEPAM 1 MG PO TABS
1.0000 mg | ORAL_TABLET | Freq: Four times a day (QID) | ORAL | Status: DC | PRN
  Administered 2023-07-04 – 2023-07-05 (×2): 1 mg via ORAL
  Filled 2023-07-03 (×2): qty 1

## 2023-07-03 MED ORDER — HALOPERIDOL LACTATE 5 MG/ML IJ SOLN: 5.0000 mg | Freq: Once | INTRAMUSCULAR | Status: DC

## 2023-07-03 MED ORDER — LORAZEPAM 2 MG/ML IJ SOLN
1.0000 mg | Freq: Once | INTRAMUSCULAR | Status: AC
  Administered 2023-07-03: 1 mg via INTRAVENOUS
  Filled 2023-07-03: qty 1

## 2023-07-03 MED ORDER — HALOPERIDOL LACTATE 5 MG/ML IJ SOLN
5.0000 mg | Freq: Once | INTRAMUSCULAR | Status: AC
  Administered 2023-07-03: 5 mg via INTRAMUSCULAR

## 2023-07-03 MED ORDER — DIPHENHYDRAMINE HCL 50 MG/ML IJ SOLN
25.0000 mg | Freq: Once | INTRAMUSCULAR | Status: AC
Start: 2023-07-03 — End: 2023-07-03
  Administered 2023-07-03: 25 mg via INTRAVENOUS

## 2023-07-03 MED ORDER — DIPHENHYDRAMINE HCL 50 MG/ML IJ SOLN
25.0000 mg | Freq: Once | INTRAMUSCULAR | Status: AC
  Administered 2023-07-04: 25 mg via INTRAVENOUS
  Filled 2023-07-03: qty 1

## 2023-07-03 NOTE — ED Provider Notes (Signed)
Emergency Medicine Observation Re-evaluation Note  Sharon Ward is a 27 y.o. female, seen on rounds today.  Pt initially presented to the ED for complaints of IVC Currently, the patient is calm, asleep.  Physical Exam  BP (!) 106/55 (BP Location: Right Arm)   Pulse 75   Temp 97.8 F (36.6 C) (Oral)   Resp 20   Ht 5\' 3"  (1.6 m)   Wt 83.9 kg   LMP 06/24/2023 (Exact Date)   SpO2 100%   BMI 32.77 kg/m  Physical Exam General: No acute distress Cardiac: Regular rate Lungs: No respiratory distress Psych: Currently calm, in restraints.  ED Course / MDM  EKG:EKG Interpretation Date/Time:  Sunday July 03 2023 07:22:45 EST Ventricular Rate:  90 PR Interval:  149 QRS Duration:  63 QT Interval:  432 QTC Calculation: 529 R Axis:   58  Text Interpretation: Sinus rhythm Borderline T abnormalities, anterior leads Prolonged QT interval , increased since last Confirmed by Linwood Dibbles [phone]) on 07/03/2023 7:41:49 AM  I have reviewed the labs performed to date as well as medications administered while in observation.  Recent changes in the last 24 hours include -patient was brought to the emergency room last night, agitated with suicidal ideations.  She was brought into the ER by PD, with patient threatening to jump off of a balcony or bridge.  On my assessment, patient is currently calm.  She is restrained.  I discussed the restraint status with the nursing staff, and they indicated that patient has been calm lately.  I have requested that they start removing the restraints as soon as possible. Nursing staff also made me aware that the current restraint order has expired at 11 am, and they would need new orders so that they can start working on removing the restraints. I made them aware that I will put in the orders, but the restraint removal will need to be priority.  Plan  Current plan is for patient to be held in the ED for psychiatric stabilization. IVC to be rescinded.     Derwood Kaplan, MD 07/03/23 1159

## 2023-07-03 NOTE — Progress Notes (Signed)
Patient has been denied by [facility] due to no appropriate beds available. Patient meets BH inpatient criteria per Eligha Bridegroom, NP. Patient has been faxed out to the following facilities:   [facility]  [address] [place] [place] [address] [phone]  [facility]  [address] [place] [address] [phone]  [facility]  [address] [place] [address] [phone]  [facility] Health Patient Placement  [facility], [place] [phone]  [facility]  [address] [place] [address] [phone]  [facility]  [address] [place] [address] [phone]  [facility] EFAX  [address] [place], [place] [phone]  [facility]  [address], [place] [address] [phone]  [facility]  [address] [place] [address] [phone]  [facility]  [address] [place], [place] [address] [phone]  [facility]  [address] [place], [place] [address] [phone]  [facility]  [address] [place] [address] [phone]  [facility]  [address], [place] [address] [phone]  [facility]  [address], [place] [address] [phone]  [facility]  [address], [place] [address] [phone]   Damita Dunnings, MSW, LCSW-A  2:04 PM 07/03/2023

## 2023-07-03 NOTE — ED Notes (Signed)
Effie Shy NP at bedside assessing pt

## 2023-07-03 NOTE — ED Notes (Signed)
Pt unable to participate in TTS assessment at this time due to drowsiness related to medications.

## 2023-07-03 NOTE — Progress Notes (Signed)
[facility] LCSW Progress Note   07/03/2023    2:26 PM  Sharon Ward   [id number]   Type of Contact and Topic:  Psychiatric Bed Placement   Pt accepted to [facility]     Patient meets inpatient criteria per Eligha Bridegroom, NP  The attending provider will be Dr. Loni Beckwith   Call report to [phone]   Denton Ar, RN @ [facility] ED notified.     Pt scheduled  to arrive at [facility] AFTER 0900.    Damita Dunnings, MSW, LCSW-A  2:28 PM 07/03/2023

## 2023-07-03 NOTE — ED Provider Notes (Signed)
Pt now becoming more calm Awaiting psychiatric disposition    Zadie Rhine, MD 07/03/23 [phone]
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
## 2023-07-03 NOTE — ED Provider Notes (Signed)
Patient becoming increasingly violent and agitated.  She started screaming at staff and trying to destroy the room. Patient was guided back to the bed with staff and security Patient was then placed in physical restraints.  She was also given Haldol Ativan and Benadryl I was present for this entire episode Patient now resting comfortably in the bed    Zadie Rhine, MD 07/03/23 [phone]

## 2023-07-03 NOTE — ED Notes (Signed)
IVC paperwork faxed to [phone]

## 2023-07-03 NOTE — ED Notes (Signed)
While obtaining q1h VS for restraints pt became uncooperative and unwilling to go from a side-lying position to lay on her back so staff could obtain and accurate BP. Security called to purple zone for standby. Security not needed to get pt to eventually lay on her back.

## 2023-07-03 NOTE — ED Notes (Signed)
Combined belongings from locker 11 w/ locker 4. Security slip for valuables noted w/ locker 4.

## 2023-07-03 NOTE — ED Notes (Signed)
Pt became agitated and screamed when asked to change into BH scrubs. Pt threw bedside table and phone off of wall. Security and provider at bedside. Multiple Rns attempted and unsuccessful to verbally deescalate pt.

## 2023-07-03 NOTE — BH Assessment (Signed)
Per RN, Pt is currently too somnolent to participate in assessment. Per EDP note, Pt required medications for sedation as well as restraints.   Pamalee Leyden, [facility], [facility] Triage Specialist

## 2023-07-04 DIAGNOSIS — R4589 Other symptoms and signs involving emotional state: Secondary | ICD-10-CM | POA: Insufficient documentation

## 2023-07-04 MED ORDER — DIVALPROEX SODIUM 500 MG PO DR TAB
500.0000 mg | DELAYED_RELEASE_TABLET | Freq: Two times a day (BID) | ORAL | Status: DC
  Administered 2023-07-04 – 2023-07-05 (×2): 500 mg via ORAL
  Filled 2023-07-04 (×2): qty 1

## 2023-07-04 MED ORDER — LORAZEPAM 2 MG/ML IJ SOLN
2.0000 mg | Freq: Once | INTRAMUSCULAR | Status: AC
  Administered 2023-07-04: 2 mg via INTRAMUSCULAR
  Filled 2023-07-04: qty 1

## 2023-07-04 MED ORDER — LORAZEPAM 1 MG PO TABS
1.0000 mg | ORAL_TABLET | ORAL | Status: AC
  Administered 2023-07-04: 1 mg via ORAL
  Filled 2023-07-04: qty 1
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
## 2023-07-04 NOTE — ED Notes (Signed)
Pt is now quiet and only occasionally yelling about wanting to make a phone call. Stretcher padded for pt's safety.
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
## 2023-07-04 NOTE — ED Notes (Signed)
Pt became agitated, yelling she should not be here, hosp security called. Was able to deescalate without further intervention
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
## 2023-07-04 NOTE — ED Notes (Signed)
Report given at 0830 to [place], [place] [facility]

## 2023-07-04 NOTE — ED Notes (Signed)
Pt is asleep. Restraints removed.

## 2023-07-04 NOTE — ED Notes (Signed)
Patient is asking if staff knows where she will be placed; pt advised that no decision has been made at this time and all bed offers are still under review; pt calm at this time-[name],RN

## 2023-07-04 NOTE — ED Notes (Signed)
Per Dr Karene Fry, okay to give 25mg  IM benadryl.
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
## 2023-07-05 ENCOUNTER — Inpatient Hospital Stay (HOSPITAL_COMMUNITY)
Admission: AD | Admit: 2023-07-05 | Discharge: 2023-07-06 | DRG: 885 | Disposition: A | Discharge status: Home / Self Care | Payer: MEDICAID | Source: Intra-hospital | Attending: Psychiatry | Admitting: Psychiatry

## 2023-07-05 ENCOUNTER — Other Ambulatory Visit: Payer: Self-pay

## 2023-07-05 ENCOUNTER — Encounter (HOSPITAL_COMMUNITY): Payer: Self-pay | Admitting: Nurse Practitioner

## 2023-07-05 DIAGNOSIS — Z825 Family history of asthma and other chronic lower respiratory diseases: Secondary | ICD-10-CM

## 2023-07-05 DIAGNOSIS — Z9151 Personal history of suicidal behavior: Secondary | ICD-10-CM | POA: Diagnosis not present

## 2023-07-05 DIAGNOSIS — Z823 Family history of stroke: Secondary | ICD-10-CM | POA: Diagnosis not present

## 2023-07-05 DIAGNOSIS — F3162 Bipolar disorder, current episode mixed, moderate: Secondary | ICD-10-CM | POA: Diagnosis present

## 2023-07-05 DIAGNOSIS — Z555 Less than a high school diploma: Secondary | ICD-10-CM

## 2023-07-05 DIAGNOSIS — F603 Borderline personality disorder: Secondary | ICD-10-CM | POA: Diagnosis present

## 2023-07-05 DIAGNOSIS — Z560 Unemployment, unspecified: Secondary | ICD-10-CM

## 2023-07-05 DIAGNOSIS — Z833 Family history of diabetes mellitus: Secondary | ICD-10-CM | POA: Diagnosis not present

## 2023-07-05 DIAGNOSIS — F411 Generalized anxiety disorder: Secondary | ICD-10-CM | POA: Diagnosis present

## 2023-07-05 DIAGNOSIS — Z8249 Family history of ischemic heart disease and other diseases of the circulatory system: Secondary | ICD-10-CM

## 2023-07-05 DIAGNOSIS — R45851 Suicidal ideations: Secondary | ICD-10-CM | POA: Diagnosis present

## 2023-07-05 DIAGNOSIS — Z79899 Other long term (current) drug therapy: Secondary | ICD-10-CM | POA: Diagnosis not present

## 2023-07-05 DIAGNOSIS — F3163 Bipolar disorder, current episode mixed, severe, without psychotic features: Secondary | ICD-10-CM

## 2023-07-05 DIAGNOSIS — Z87891 Personal history of nicotine dependence: Secondary | ICD-10-CM | POA: Diagnosis not present

## 2023-07-05 DIAGNOSIS — Z888 Allergy status to other drugs, medicaments and biological substances status: Secondary | ICD-10-CM | POA: Diagnosis not present

## 2023-07-05 DIAGNOSIS — Z91040 Latex allergy status: Secondary | ICD-10-CM | POA: Diagnosis not present

## 2023-07-05 MED ORDER — LORAZEPAM 1 MG PO TABS: 1.0000 mg | ORAL_TABLET | Freq: Four times a day (QID) | ORAL | Status: DC | PRN

## 2023-07-05 MED ORDER — LORAZEPAM 2 MG/ML IJ SOLN: 2.0000 mg | Freq: Four times a day (QID) | INTRAMUSCULAR | Status: DC | PRN

## 2023-07-05 MED ORDER — DIPHENHYDRAMINE HCL 50 MG/ML IJ SOLN
50.0000 mg | Freq: Once | INTRAMUSCULAR | Status: AC
  Administered 2023-07-05: 50 mg via INTRAMUSCULAR
  Filled 2023-07-05: qty 1

## 2023-07-05 MED ORDER — ALUM & MAG HYDROXIDE-SIMETH 200-200-20 MG/5ML PO SUSP: 30.0000 mL | ORAL | Status: DC | PRN

## 2023-07-05 MED ORDER — MIDAZOLAM HCL 2 MG/2ML IJ SOLN
2.0000 mg | Freq: Once | INTRAMUSCULAR | Status: DC
  Filled 2023-07-05: qty 2

## 2023-07-05 MED ORDER — MAGNESIUM HYDROXIDE 400 MG/5ML PO SUSP
30.0000 mL | Freq: Every day | ORAL | Status: DC | PRN
Start: 2023-07-05 — End: 2023-07-06

## 2023-07-05 MED ORDER — ACETAMINOPHEN 325 MG PO TABS: 650.0000 mg | ORAL_TABLET | Freq: Four times a day (QID) | ORAL | Status: DC | PRN

## 2023-07-05 MED ORDER — DIPHENHYDRAMINE HCL 25 MG PO CAPS
50.0000 mg | ORAL_CAPSULE | Freq: Three times a day (TID) | ORAL | Status: DC | PRN
  Administered 2023-07-05: 50 mg via ORAL
  Filled 2023-07-05: qty 2

## 2023-07-05 MED ORDER — DIVALPROEX SODIUM 500 MG PO DR TAB
500.0000 mg | DELAYED_RELEASE_TABLET | Freq: Two times a day (BID) | ORAL | Status: DC
  Administered 2023-07-05 – 2023-07-06 (×2): 500 mg via ORAL
  Filled 2023-07-05 (×7): qty 1

## 2023-07-05 NOTE — ED Notes (Signed)
Pt has continued to be verbally and physically aggressive. PT is now in 4-point restraints. If bed is too close to the wall she hits head on wall.  She will try to rock the bed so we have positioned the bed at an angle in room to try to prevent that.  sHe then tries to hit head on bed rail so we put the head rail down.
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
## 2023-07-05 NOTE — Tx Team (Signed)
Initial Treatment Plan 07/05/2023 6:56 PM [name] WUJ:[id number]    PATIENT STRESSORS: Marital or family conflict   Substance abuse     PATIENT STRENGTHS: Capable of independent living  Communication skills  General fund of knowledge  Physical Health  Supportive family/friends    PATIENT IDENTIFIED PROBLEMS: "I want to get out of here"                     DISCHARGE CRITERIA:  Improved stabilization in mood, thinking, and/or behavior Need for constant or close observation no longer present  PRELIMINARY DISCHARGE PLAN: Return to previous living arrangement  PATIENT/FAMILY INVOLVEMENT: This treatment plan has been presented to and reviewed with the patient, Sharon Ward.  The patient and family have been given the opportunity to ask questions and make suggestions.  Edwyna Perfect, RN 07/05/2023, 6:56 PM
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
## 2023-07-05 NOTE — [facility] Group Notes (Signed)
[facility] Group Notes:  (Nursing/MHT/Case Management/Adjunct)  Date:  07/05/2023  Time:  10:20 PM  Type of Therapy:  Psychoeducational Skills  Participation Level:  Minimal  Participation Quality:  Resistant  Affect:  Depressed  Cognitive:  Lacking  Insight:  Limited  Engagement in Group:  Resistant  Modes of Intervention:  Education  Summary of Progress/Problems: The patient rated her day as a 0 out of 10 but would not elaborate any further. Her goal for tomorrow is to speak to more of her peers.   Sharon Ward S 07/05/2023, 10:20 PM
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
## 2023-07-05 NOTE — ED Notes (Signed)
[name] NP discussed with pt that pt is not going to [facility] and that she would see if [facility] or [facility] had availability.  She also suggested that if she was calm and cooperative today and into tomorrow that perhaps they could consider DC home.    Pt is still in restraints, calm and cooperative at this time.  We will evaluate removing restraints at 9am based on behavior.
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
## 2023-07-05 NOTE — Progress Notes (Signed)
[facility] Psych ED Progress Note  07/05/2023 11:18 AM Sharon Ward  MRN:  [id number]   Subjective:   Patient seen this morning at [facility], ED for face-to-face psychiatric reevaluation.  Per chart review appears patient slept most of the night however went back into restraints this morning due to agitation.  Upon reassessment she is in 4 point restraints, crying, and stating she does not want to go to [facility].  Explained to patient she is not going to [facility] anymore.  She immediately stopped crying, and stated that is why she has been so upset and angry.  Patient stated she does not want to go to inpatient psychiatry which is another reason why she has been so agitated and upset.  Today patient was able to calm herself down and participate in a decently logical conversation.  She continues to be adamant that she never wanted to kill herself and the police are lying about her suicidal statements.  She continues to states she denies any suicidal or homicidal ideations.  Denies any auditory visual hallucinations.  We spoke about her prolonged QTc and how her Abilify was discontinued, and we will start Depakote.  Patient stated she has been on Depakote before and feels like it works well for her, she is agreeable with restarting.  Appears she got her first dose last night, and will take again this morning.  Explained to patient if she can remain calm, cooperative, not be aggressive or threatening toward staff then we can consider possible discharge home tomorrow.  However patient needs to remain out of restraints for the rest of the day.  Patient expressed gratefulness to be considered for discharge and stated she will be calm and cooperative for the remainder of the day.  Appears most of patient's aggression and threatening behaviors towards police and staff have been related to her thinking she is going to [facility].  Patient has expressed numerous times having a terrible experience at this  inpatient unit and stating she never wants to go back there.  I do believe her agitation and threatening behaviors have been more volitional and behavioral versus psychotic in nature.  If patient is able to remain out of restraints for the rest of the day, compliant with medications, and continues to deny SI/HI/AVH I do think it is reasonable to consider for discharge tomorrow.  However patient is aware if she remains agitated and goes into restraints again for threatening behaviors we will continue with plan for inpatient.  She is understanding and agreeable of this plan.  Principal Problem: Bipolar affective disorder, mixed, severe (HCC) Diagnosis:  Principal Problem:   Bipolar affective disorder, mixed, severe (HCC) Active Problems:   Prolonged QT interval   Borderline personality disorder (HCC)   Suicidal behavior   ED Assessment Time Calculation: Start Time: 1100 Stop Time: 1130 Total Time in Minutes (Assessment Completion): 30   Past Psychiatric History:  See original HPI  [name] Scale:  Flowsheet Row ED from 06/19/2023 in [facility] Emergency Department at [facility] Admission (Discharged) from 05/02/2023 in [facility] 1S Maternity Assessment Unit ED to Hosp-Admission (Discharged) from 04/04/2023 in [place] LONG 4TH FLOOR PROGRESSIVE CARE AND UROLOGY  C-SSRS RISK CATEGORY No Risk High Risk High Risk       Past Medical History:  Past Medical History:  Diagnosis Date   Asthma    Bipolar 1 disorder, mixed (HCC)    Depression    Generalized anxiety disorder    Intentional drug overdose (  HCC) 11/03/2020   Low-lying placenta 01/07/2022   Resolved 03/04/22   Relationship dysfunction    Suicide attempt (HCC) 11/02/2020    Past Surgical History:  Procedure Laterality Date   PILONIDAL CYST / SINUS EXCISION  09/11/2013   PILONIDAL CYST EXCISION  05/17/2014   Pilonidal cystectomy with cleft lip   Family History:  Family History  Problem Relation Age of Onset   Asthma  Mother    Diabetes Mother    Healthy Mother    Hypertension Father    Asthma Father    Diabetes Father    Healthy Father    Asthma Brother    Hypertension Paternal Uncle    Diabetes Paternal Grandmother    Stroke Paternal Grandfather    Heart disease Paternal Grandfather    Hypertension Paternal Grandfather    Diabetes Paternal Grandfather    Family Psychiatric  History: unknown Social History:  Social History   Substance and Sexual Activity  Alcohol Use Not Currently   Comment: occasional prior to pregnancy     Social History   Substance and Sexual Activity  Drug Use Not Currently   Frequency: 4.0 times per week   Types: Marijuana   Comment: No Delta 9 since February 2023    Social History   Socioeconomic History   Marital status: Single    Spouse name: Not on file   Number of children: 1   Years of education: 10   Highest education level: 10th grade  Occupational History   Not on file  Tobacco Use   Smoking status: Former    Current packs/day: 0.00    Average packs/day: 1 pack/day for 15.0 years (15.0 ttl pk-yrs)    Types: Cigarettes    Start date: 07/14/2006    Quit date: 07/14/2021    Years since quitting: 1.9   Smokeless tobacco: Never   Tobacco comments:    only smoke a "couple" of cigarettes when stressed or anxious, socially with friends per [facility] chart  Vaping Use   Vaping status: Former   Start date: 08/10/2003   Quit date: 05/04/2021  Substance and Sexual Activity   Alcohol use: Not Currently    Comment: occasional prior to pregnancy   Drug use: Not Currently    Frequency: 4.0 times per week    Types: Marijuana    Comment: No Delta 9 since February 2023   Sexual activity: Not Currently    Partners: Male    Birth control/protection: None  Other Topics Concern   Not on file  Social History Narrative   Not on file   Social Determinants of Health   Financial Resource Strain: Not on file  Food Insecurity: Patient Unable To Answer (04/04/2023)    Hunger Vital Sign    Worried About Running Out of Food in the Last Year: Patient unable to answer    Ran Out of Food in the Last Year: Patient unable to answer  Transportation Needs: Patient Unable To Answer (04/04/2023)   PRAPARE - Administrator, Civil Service (Medical): Patient unable to answer    Lack of Transportation (Non-Medical): Patient unable to answer  Physical Activity: Not on file  Stress: Not on file  Social Connections: Not on file    Sleep: Good  Appetite:  Good  Current Medications: Current Facility-Administered Medications  Medication Dose Route Frequency Provider Last Rate Last Admin   divalproex (DEPAKOTE) DR tablet 500 mg  500 mg Oral BID Eligha Bridegroom, NP   500 mg at  07/05/23 0905   LORazepam (ATIVAN) tablet 1 mg  1 mg Oral Q6H PRN Eligha Bridegroom, NP   1 mg at 07/04/23 1142   Or   LORazepam (ATIVAN) injection 2 mg  2 mg Intramuscular Q6H PRN Eligha Bridegroom, NP   2 mg at 07/05/23 0626   midazolam (VERSED) injection 2 mg  2 mg Intramuscular Once Gloris Manchester, MD       Current Outpatient Medications  Medication Sig Dispense Refill   sulfamethoxazole-trimethoprim (BACTRIM DS) 800-160 MG tablet Take 1 tablet by mouth 2 (two) times daily.     ARIPiprazole (ABILIFY) 15 MG tablet Take 1 tablet (15 mg total) by mouth daily for 28 days. 7 tablet 3   traZODone (DESYREL) 50 MG tablet Take 0.5-1 tablets (25-50 mg total) by mouth at bedtime for 28 days. 7 tablet 3    Lab Results: No results found for this or any previous visit (from the past 48 hour(s)).  Blood Alcohol level:  Lab Results  Component Value Date   ETH <10 07/02/2023   ETH <10 04/04/2023    Physical Findings:  CIWA:    COWS:     Musculoskeletal: Strength & Muscle Tone: within normal limits Gait & Station: normal Patient leans: N/A  Psychiatric Specialty Exam:  Presentation  General Appearance:  Fairly Groomed  Eye Contact: Fair  Speech: Clear and Coherent  Speech  Volume: Normal  Handedness: Right   Mood and Affect  Mood: Anxious  Affect: Labile; Congruent   Thought Process  Thought Processes: Goal Directed  Descriptions of Associations:Intact  Orientation:Full (Time, Place and Person)  Thought Content:WDL  History of Schizophrenia/Schizoaffective disorder:No  Duration of Psychotic Symptoms:N/A  Hallucinations:Hallucinations: None  Ideas of Reference:None  Suicidal Thoughts:Suicidal Thoughts: No  Homicidal Thoughts:Homicidal Thoughts: No   Sensorium  Memory: Immediate Fair; Recent Fair  Judgment: Fair  Insight: Fair   Art therapist  Concentration: Fair  Attention Span: Fair  Recall: Fair  Fund of Knowledge: Fair  Language: Fair   Psychomotor Activity  Psychomotor Activity: Psychomotor Activity: Normal   Assets  Assets: Desire for Improvement; Physical Health; Communication Skills   Sleep  Sleep: Sleep: Good    Physical Exam: Physical Exam Neurological:     Mental Status: She is alert and oriented to person, place, and time.  Psychiatric:        Attention and Perception: Attention normal.        Mood and Affect: Mood is anxious.        Speech: Speech normal.        Behavior: Behavior is cooperative.        Thought Content: Thought content normal.    Review of Systems  Psychiatric/Behavioral:  Positive for depression. The patient is nervous/anxious.   All other systems reviewed and are negative.  Blood pressure (!) 105/44, pulse 96, temperature 97.9 F (36.6 C), temperature source Oral, resp. rate 18, height 5\' 3"  (1.6 m), weight 83.9 kg, last menstrual period 06/24/2023, SpO2 99%, unknown if currently breastfeeding. Body mass index is 32.77 kg/m.   Medical Decision Making: Pt case reviewed and discussed with Dr. Lucianne Muss. At this moment, will continue to recommend inpatient treatment. No [facility] due to past trauma from previous encounters.   If patient can remain out  of restraints for the rest of day, compliant with medications, and calm/cooperative with staff, I do think it is reasonable to consider discharge tomorrow. It appears most of threatening behaviors and aggression have been volitional and behavioral due to  not wanting to go to [facility].   Problem 1: bipolar disorder, mixed episode - continue Depakote 500 mg BID   Eligha Bridegroom, NP 07/05/2023, 11:18 AM
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
## 2023-07-05 NOTE — ED Notes (Signed)
Pt has begun ramping up expressing her frustration with waiting for

## 2023-07-05 NOTE — ED Provider Notes (Addendum)
Emergency Medicine Observation Re-evaluation Note  Sharon Ward is a 27 y.o. female, seen on rounds today.  Pt initially presented to the ED for complaints of IVC Currently, the patient is in restraints, agitated, screaming about wanting to make a phone call.  Physical Exam  BP (!) 105/44 (BP Location: Left Arm)   Pulse 95   Temp 97.9 F (36.6 C) (Oral)   Resp 18   Ht 5\' 3"  (1.6 m)   Wt 83.9 kg   LMP 06/24/2023 (Exact Date)   SpO2 98%   BMI 32.77 kg/m  Physical Exam General: Awake, alert Cardiac: Extremities well-perfused Lungs: Breathing is unlabored Psych: Agitated and screaming  ED Course / MDM  EKG:EKG Interpretation Date/Time:  Sunday July 03 2023 07:22:45 EST Ventricular Rate:  90 PR Interval:  149 QRS Duration:  63 QT Interval:  432 QTC Calculation: 529 R Axis:   58  Text Interpretation: Sinus rhythm Borderline T abnormalities, anterior leads Prolonged QT interval , increased since last Confirmed by Linwood Dibbles [phone]) on 07/03/2023 7:41:49 AM  I have reviewed the labs performed to date as well as medications administered while in observation.  Recent changes in the last 24 hours include: Seems to get agitated from conversations that she has had on the phone.  She has had agitation since 6 AM this morning.  This has continued despite Ativan and Benadryl.  Antipsychotic medications have been withheld due to EKG from 2 days ago showing a long QTc.  Additional Versed was ordered.  If patient's agitation can improve, will attempt repeat EKG.  Patient did not require the Versed.  Prior to getting it, psychiatric nurse practitioner came and spoke with her.  She offered her an send is for good behavior: Removal of restraints, attempts at placement closer than [facility].  On reassessment, patient is now calm and cooperative.  Repeat EKG shows normalized QTc.  Patient no further behavioral disruptions.  Plan  Current plan is for inpatient psychiatric admission.     Gloris Manchester, MD 07/05/23 [id number]    Gloris Manchester, MD 07/05/23 [phone]
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
## 2023-07-05 NOTE — Plan of Care (Signed)
  Problem: Education: Goal: Emotional status will improve Outcome: Not Progressing Goal: Mental status will improve Outcome: Not Progressing   

## 2023-07-05 NOTE — Progress Notes (Signed)
   07/05/23 2101  Psych Admission Type (Psych Patients Only)  Admission Status Involuntary  Psychosocial Assessment  Patient Complaints Agitation;Anxiety;Depression  Eye Contact Fair  Facial Expression Flat  Affect Flat;Depressed;Sad  Speech Logical/coherent  Interaction Assertive  Motor Activity Other (Comment) (WDL)  Appearance/Hygiene Unremarkable  Behavior Characteristics Anxious  Mood Depressed;Angry  Thought Process  Coherency WDL  Content Blaming others  Delusions WDL  Perception WDL  Hallucination None reported or observed  Judgment Impaired  Confusion None  Danger to Self  Current suicidal ideation? Denies  Danger to Others  Danger to Others None reported or observed

## 2023-07-06 DIAGNOSIS — F3162 Bipolar disorder, current episode mixed, moderate: Secondary | ICD-10-CM
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
## 2023-07-06 NOTE — Progress Notes (Signed)
Sharon Ward  D/C'd Home per MD order.  Discussed with the patient and all questions fully answered.  An After Visit Summary was printed and given to the patient. Patient received prescription.  D/c education completed with patient including follow up instructions, medication list, d/c activities limitations if indicated, with other d/c instructions as indicated by MD - patient able to verbalize understanding, all questions fully answered. Patient denies SI/HI/AVH at the time of discharge.    Patient instructed to return to ED, call 911, or call [facility] for any change in her mental health.   Patient escorted to the main entrance, and D/C home via [place] taxi at [facility] [employer].  [name] 07/06/2023 12:33 PM
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
## 2023-07-06 NOTE — Progress Notes (Signed)
  [facility] Adult Case Management Discharge Plan :  Will you be returning to the same living situation after discharge:  Yes,  pt is returning home At discharge, do you have transportation home?: Yes,  CSW arranged taxi for 12:15PM Do you have the ability to pay for your medications: Yes,  pt has [employer] MCD  Release of information consent forms completed and in the chart;  Patient's signature needed at discharge.  Patient to Follow up at: Pt does not need follow ups as pt is already established in the community - pt was only here since 7:00PM last night.   Next level of care provider has access to [facility] Link:no  Safety Planning and Suicide Prevention discussed: No. Not needed, pt d/c within 24 hours of admission.     Has patient been referred to the Quitline?: Pt not currently smoking cigarettes, has not smoked in 2 years according to chart.  Patient has been referred for addiction treatment: No known substance use disorder.  [name] Der, LCSWA 07/06/2023, 11:34 AM

## 2023-07-06 NOTE — Group Note (Signed)
Recreation Therapy Group Note   Group Topic:Team Building  Group Date: 07/06/2023 Start Time: 0935 End Time: 1015 Facilitators: Aleshka Corney-McCall, LRT,CTRS Location: [address] Dayroom   Group Topic: Communication, Team Building, Problem Solving  Goal Area(s) Addresses:  Patient will effectively work with peer towards shared goal.  Patient will identify skills used to make activity successful.  Patient will identify how skills used during activity can be used to reach post d/c goals.   Intervention: STEM Activity  Group Description: Straw Bridge. In teams of 3-5, patients were given 15 plastic drinking straws and an equal length of masking tape. Using the materials provided, patients were instructed to build a free standing bridge-like structure to suspend an everyday item (ex: puzzle box) off of the floor or table surface. All materials were required to be used by the team in their design. LRT facilitated post-activity discussion reviewing team process. Patients were encouraged to reflect how the skills used in this activity can be generalized to daily life post discharge.   Education: Pharmacist, community, Scientist, physiological, Discharge Planning   Education Outcome: Acknowledges education/In group clarification offered/Needs additional education.    Affect/Mood: Flat   Participation Level: Minimal   Participation Quality: Independent   Behavior: Distracted   Speech/Thought Process: None   Insight: None   Judgement: None   Modes of Intervention: STEM Activity   Patient Response to Interventions:  Receptive   Education Outcome:  In group clarification offered    Clinical Observations/Individualized Feedback: Pt didn't fully participate due to completing activity in previous admission. Pt did assist peers at one point during session. Pt was focused on speaking to a doctor which became her primary focus.      Plan: Continue to engage patient in RT group sessions  2-3x/week.   Jewelianna Pancoast-McCall, LRT,CTRS 07/06/2023 12:43 PM

## 2023-07-06 NOTE — Transportation (Signed)
07/06/2023  Sharon Ward DOB: [birth year] MRN: [id number]   RIDER WAIVER AND RELEASE OF LIABILITY  For the purposes of helping with transportation needs, [facility] partners with outside transportation providers (taxi companies, [place], Catering manager.) to give [employer] patients or other approved people the choice of on-demand rides [employer]") to our buildings for non-emergency visits.  By using [employer], I, the person signing this document, on behalf of myself and/or any legal minors (in my care using the [employer]), agree:  Science writer given to me are supplied by independent, outside transportation providers who do not work for, or have any affiliation with, [employer]. [place] is not a transportation company. [place] has no control over the quality or safety of the rides I get using [employer]. [place] has no control over whether any outside ride will happen on time or not. [place] gives no guarantee on the reliability, quality, safety, or availability on any rides, or that no mistakes will happen. I know and accept that traveling by vehicle (car, truck, [employer], Zenaida Niece, bus, taxi, etc.) has risks of serious injuries such as disability, being paralyzed, and death. I know and agree the risk of using [employer] is mine alone, and not [employer]. Transport Services are provided "as is" and as are available. The transportation providers are in charge for all inspections and care of the vehicles used to provide these rides. I agree not to take legal action against [place], its agents, employees, officers, directors, representatives, insurers, attorneys, assigns, successors, subsidiaries, and affiliates at any time for any reasons related directly or indirectly to using [employer]. I also agree not to take legal action against [place] or its affiliates for any injury, death, or damage to property caused by or related to  using [employer]. I have read this Waiver and Release of Liability, and I understand the terms used in it and their legal meaning. This Waiver is freely and voluntarily given with the understanding that my right (or any legal minors) to legal action against [place] relating to [employer] is knowingly given up to use these services.   I attest that I read the Ride Waiver and Release of Liability to Sharon Ward, gave Ms. Post the opportunity to ask questions and answered the questions asked (if any). I affirm that Sharon Ward then provided consent for assistance with transportation.
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
## 2023-07-06 NOTE — Progress Notes (Signed)
   07/06/23 0530  15 Minute Checks  Location Bedroom  Visual Appearance Calm  Behavior Sleeping  Sleep (Behavioral Health Patients Only)  Calculate sleep? (Click Yes once per 24 hr at 0600 safety check) Yes  Documented sleep last 24 hours 7.75

## 2023-07-06 NOTE — Plan of Care (Signed)
  Problem: Education: Goal: Knowledge of [facility] Education information/materials will improve Outcome: Completed/Met Goal: Emotional status will improve Outcome: Completed/Met Goal: Mental status will improve Outcome: Completed/Met Goal: Verbalization of understanding the information provided will improve Outcome: Completed/Met   Problem: Activity: Goal: Interest or engagement in activities will improve Outcome: Completed/Met Goal: Sleeping patterns will improve Outcome: Completed/Met
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
## 2023-07-09 ENCOUNTER — Emergency Department (HOSPITAL_COMMUNITY)
Admission: EM | Admit: 2023-07-09 | Discharge: 2023-07-09 | Disposition: A | Discharge status: Home / Self Care | Payer: MEDICAID | Attending: Emergency Medicine | Admitting: Emergency Medicine

## 2023-07-09 ENCOUNTER — Other Ambulatory Visit: Payer: Self-pay

## 2023-07-09 ENCOUNTER — Encounter (HOSPITAL_COMMUNITY): Payer: Self-pay

## 2023-07-09 DIAGNOSIS — M79652 Pain in left thigh: Secondary | ICD-10-CM | POA: Diagnosis present

## 2023-07-09 DIAGNOSIS — M7918 Myalgia, other site: Secondary | ICD-10-CM

## 2023-07-09 DIAGNOSIS — Z91040 Latex allergy status: Secondary | ICD-10-CM | POA: Insufficient documentation

## 2023-07-09 MED ORDER — LIDOCAINE 5 % EX PTCH: 2.0000 | MEDICATED_PATCH | CUTANEOUS | 0 refills | Status: DC

## 2023-07-09 MED ORDER — LIDOCAINE 5 % EX PTCH: 2.0000 | MEDICATED_PATCH | CUTANEOUS | Status: DC
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
## 2023-07-09 NOTE — Discharge Instructions (Signed)
Today you were seen for musculoskeletal pain.  Pick up your prescription and use as prescribed.  May also alternate taking Tylenol and Motrin as needed for pain.  Please do not take Motrin for greater than 5 days and arises may cause rebound headaches.  Thank you for letting us treat you today. After performing physical exam, I feel you are safe to go home. Please follow up with your PCP in the next several days and provide them with your records from this visit. Return to the Emergency Room if pain becomes severe or symptoms worsen.
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
## 2023-07-18 ENCOUNTER — Ambulatory Visit: Payer: MEDICAID

## 2023-08-26 ENCOUNTER — Other Ambulatory Visit: Payer: Self-pay

## 2023-08-26 ENCOUNTER — Other Ambulatory Visit (HOSPITAL_COMMUNITY)
Admission: RE | Admit: 2023-08-26 | Discharge: 2023-08-26 | Disposition: A | Discharge status: Home / Self Care | Payer: MEDICAID | Source: Ambulatory Visit | Attending: Family Medicine | Admitting: Family Medicine

## 2023-08-26 ENCOUNTER — Ambulatory Visit: Payer: MEDICAID

## 2023-08-26 VITALS — BP 116/67 | HR 67 | Ht 63.0 in | Wt 169.0 lb

## 2023-08-26 DIAGNOSIS — N760 Acute vaginitis: Secondary | ICD-10-CM

## 2023-08-26 DIAGNOSIS — B9689 Other specified bacterial agents as the cause of diseases classified elsewhere: Secondary | ICD-10-CM | POA: Diagnosis not present

## 2023-08-26 NOTE — Progress Notes (Signed)
Sharon Ward is here with concerns of vaginal itchiness and vaginal odor. These symptoms have been reoccurring for the last 4-5 months. Patient request to do a self swab.   Self swab instructions given and specimen obtained. Explained patient will be contacted with any abnormal results. Verified patient's phone number and patient states she has access to Mychart. Patient is due for annual exam; offered for patient to schedule during checkout.  Quintella Reichert, RN 08/26/2023  10:37 AM

## 2023-08-29 LAB — CERVICOVAGINAL ANCILLARY ONLY
Bacterial Vaginitis (gardnerella): POSITIVE — AB
Candida Glabrata: NEGATIVE
Candida Vaginitis: NEGATIVE
Chlamydia: NEGATIVE
Comment: NEGATIVE
Comment: NEGATIVE
Comment: NEGATIVE
Comment: NEGATIVE
Comment: NEGATIVE
Comment: NORMAL
Neisseria Gonorrhea: NEGATIVE
Trichomonas: NEGATIVE

## 2023-08-30 ENCOUNTER — Ambulatory Visit: Payer: MEDICAID | Admitting: Family Medicine

## 2023-08-31 ENCOUNTER — Other Ambulatory Visit: Payer: Self-pay | Admitting: Family Medicine

## 2023-08-31 ENCOUNTER — Encounter: Payer: Self-pay | Admitting: Family Medicine

## 2023-08-31 DIAGNOSIS — B379 Candidiasis, unspecified: Secondary | ICD-10-CM

## 2023-08-31 MED ORDER — METRONIDAZOLE 500 MG PO TABS: 500.0000 mg | ORAL_TABLET | Freq: Two times a day (BID) | ORAL | 0 refills | Status: DC

## 2023-09-08 ENCOUNTER — Ambulatory Visit (HOSPITAL_COMMUNITY): Admission: EM | Admit: 2023-09-08 | Discharge: 2023-09-08 | Discharge status: Against Medical Advice | Payer: MEDICAID

## 2023-09-08 ENCOUNTER — Encounter (HOSPITAL_COMMUNITY): Payer: Self-pay

## 2023-09-08 NOTE — ED Triage Notes (Addendum)
Black widow bite Wednesday. Now having swelling and pain in the left hand where the bite is. Patient having SOB, chest tightness onset late last night and getting worse. Having hot flashes, nausea, itching at the bite spot, and anxiety.   Patient has a history of asthma. Does not have any inhalers on hand.

## 2023-09-14 ENCOUNTER — Other Ambulatory Visit: Payer: Self-pay

## 2023-09-15 MED ORDER — TERCONAZOLE 0.4 % VA CREA: 1.0000 | TOPICAL_CREAM | Freq: Every day | VAGINAL | 0 refills | Status: DC

## 2023-10-17 ENCOUNTER — Encounter: Payer: Self-pay | Admitting: Advanced Practice Midwife

## 2023-10-17 ENCOUNTER — Ambulatory Visit: Payer: MEDICAID | Admitting: Advanced Practice Midwife

## 2023-10-17 VITALS — BP 127/65 | HR 52 | Ht 63.0 in | Wt 169.7 lb

## 2023-10-17 DIAGNOSIS — T8332XA Displacement of intrauterine contraceptive device, initial encounter: Secondary | ICD-10-CM

## 2023-10-17 DIAGNOSIS — Z5941 Food insecurity: Secondary | ICD-10-CM

## 2023-10-17 DIAGNOSIS — T839XXA Unspecified complication of genitourinary prosthetic device, implant and graft, initial encounter: Secondary | ICD-10-CM

## 2023-10-17 DIAGNOSIS — F4310 Post-traumatic stress disorder, unspecified: Secondary | ICD-10-CM | POA: Diagnosis not present

## 2023-10-17 DIAGNOSIS — F411 Generalized anxiety disorder: Secondary | ICD-10-CM

## 2023-10-17 NOTE — Progress Notes (Signed)
 GYNECOLOGY PROGRESS NOTE  History:  28 y.o. [id number] presents to [facility] office today for problem gyn visit. She reports pain since insertion of IUD 1 year ago and heavy long menses. She is starting to have signs of anemia, including feeling cold even when the temperature in the room is warm.   She denies h/a, dizziness, shortness of breath, n/v, or fever/chills.    The following portions of the patient's history were reviewed and updated as appropriate: allergies, current medications, past family history, past medical history, past social history, past surgical history and problem list. Last pap smear on 08/18/22 was normal, neg HRHPV.  Health Maintenance Due  Topic Date Due   COVID-19 Vaccine (1) Never done   Pneumococcal Vaccine 83-12 Years old (1 of 2 - PCV) Never done     Review of Systems:  Pertinent items are noted in HPI.   Objective:  Physical Exam Blood pressure 127/65, pulse (!) 52, height 5\' 3"  (1.6 m), weight 169 lb 11.2 oz (77 kg), last menstrual period 10/10/2023, unknown if currently breastfeeding. VS reviewed, nursing note reviewed,  Constitutional: well developed, well nourished, no distress HEENT: normocephalic CV: normal rate Pulm/chest wall: normal effort Breast Exam: deferred Abdomen: soft Neuro: alert and oriented x 3 Skin: warm, dry Psych: affect normal Pelvic exam: Cervix pink, visually closed, without lesion, scant white creamy discharge, vaginal walls and external genitalia normal Bimanual exam: Cervix 0/long/high, firm, anterior, neg CMT, uterus nontender, nonenlarged, adnexa without tenderness, enlargement, or mass  Assessment & Plan:  1. Complication of intrauterine device (IUD), unspecified complication, initial encounter (HCC) (Primary)   2. Food insecurity --Pt with issue getting enough Food Stamps, offered food bank for supplementary food until she can get these increased.  - AMBULATORY REFERRAL TO [facility]  3. PTSD  (post-traumatic stress disorder) --Pt with dx of bipolar, depression, anxiety, but she reports "my diagnosis are all wrong" --Pt saw Asher Muir with previous pregnancy and would like to speak with counselor again.  --Plans to go to [facility] to establish care with psychiatry - Ambulatory referral to [facility]  --After pelvic exam today and strings not visible for IUD, pt became very agitated and upset. She stated "now this thing is going to stay in forever and is going to kill me"  She left the office despite reassurance by provider and attempt to reschedule follow up with pre-medications.    - Ambulatory referral to [facility]  4. Intrauterine contraceptive device threads lost, initial encounter --Strings not seen on exam.  Attempt with cotton swab to locate strings not well tolerated. Pt with hx pregnancy loss and PTSD with panic attack triggered during this exam.  IUD removed without further investigation.  Pt became very upset.  Reassurance provided that the IUD is likely in place and this is a common finding.  Pt can be rescheduled and take medicines prior to appt to make pt more relaxed and reduce pain.  Pt unable to make appts today and unable to stay as planned for the food bank.  She was loud, and was heard outside the room by other staff, but was not threatening and left the office without difficulty.   --It is my hope that she reschedules for removal on another day, and takes advantage of the [employer] and [facility] referrals.    5. GAD (generalized anxiety disorder) --See encounter for IUD strings/PTSD above    No follow-ups on file.   Sharen Counter,  CNM 6:15 PM

## 2023-10-19 ENCOUNTER — Encounter: Payer: Self-pay | Admitting: Advanced Practice Midwife

## 2023-10-20 ENCOUNTER — Encounter (HOSPITAL_COMMUNITY): Payer: Self-pay | Admitting: Student

## 2023-10-20 ENCOUNTER — Ambulatory Visit (HOSPITAL_COMMUNITY): Payer: MEDICAID | Admitting: Student

## 2023-10-20 DIAGNOSIS — F17200 Nicotine dependence, unspecified, uncomplicated: Secondary | ICD-10-CM | POA: Diagnosis not present

## 2023-10-20 DIAGNOSIS — F4310 Post-traumatic stress disorder, unspecified: Secondary | ICD-10-CM

## 2023-10-20 DIAGNOSIS — Z9151 Personal history of suicidal behavior: Secondary | ICD-10-CM

## 2023-10-20 DIAGNOSIS — F603 Borderline personality disorder: Secondary | ICD-10-CM | POA: Diagnosis not present

## 2023-10-20 DIAGNOSIS — F1290 Cannabis use, unspecified, uncomplicated: Secondary | ICD-10-CM | POA: Diagnosis not present

## 2023-10-20 DIAGNOSIS — Z8659 Personal history of other mental and behavioral disorders: Secondary | ICD-10-CM

## 2023-10-20 DIAGNOSIS — Z91148 Patient's other noncompliance with medication regimen for other reason: Secondary | ICD-10-CM

## 2023-10-20 HISTORY — DX: Nicotine dependence, unspecified, uncomplicated: F17.200

## 2023-10-20 MED ORDER — CARIPRAZINE HCL 1.5 MG PO CAPS: 1.5000 mg | ORAL_CAPSULE | Freq: Every evening | ORAL | 4 refills | Status: DC

## 2023-10-20 MED ORDER — CARIPRAZINE HCL 1.5 MG PO CAPS: 1.5000 mg | ORAL_CAPSULE | Freq: Every evening | ORAL | 4 refills | Status: AC
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
## 2023-10-24 ENCOUNTER — Encounter (HOSPITAL_COMMUNITY): Payer: Self-pay

## 2023-10-24 ENCOUNTER — Ambulatory Visit (INDEPENDENT_AMBULATORY_CARE_PROVIDER_SITE_OTHER): Payer: MEDICAID | Admitting: Clinical

## 2023-10-24 DIAGNOSIS — F411 Generalized anxiety disorder: Secondary | ICD-10-CM | POA: Diagnosis not present
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
## 2023-10-30 ENCOUNTER — Emergency Department (HOSPITAL_COMMUNITY)
Admission: EM | Admit: 2023-10-30 | Discharge: 2023-11-07 | Disposition: A | Discharge status: Home / Self Care | Payer: MEDICAID | Attending: Emergency Medicine | Admitting: Emergency Medicine

## 2023-10-30 ENCOUNTER — Other Ambulatory Visit: Payer: Self-pay

## 2023-10-30 ENCOUNTER — Emergency Department (HOSPITAL_COMMUNITY): Payer: MEDICAID

## 2023-10-30 DIAGNOSIS — F4312 Post-traumatic stress disorder, chronic: Secondary | ICD-10-CM | POA: Diagnosis not present

## 2023-10-30 DIAGNOSIS — S66109A Unspecified injury of flexor muscle, fascia and tendon of unspecified finger at wrist and hand level, initial encounter: Secondary | ICD-10-CM | POA: Diagnosis not present

## 2023-10-30 DIAGNOSIS — S61227A Laceration with foreign body of left little finger without damage to nail, initial encounter: Secondary | ICD-10-CM | POA: Insufficient documentation

## 2023-10-30 DIAGNOSIS — R45851 Suicidal ideations: Secondary | ICD-10-CM | POA: Diagnosis not present

## 2023-10-30 DIAGNOSIS — S61112A Laceration without foreign body of left thumb with damage to nail, initial encounter: Secondary | ICD-10-CM | POA: Insufficient documentation

## 2023-10-30 DIAGNOSIS — F3130 Bipolar disorder, current episode depressed, mild or moderate severity, unspecified: Secondary | ICD-10-CM | POA: Diagnosis present

## 2023-10-30 DIAGNOSIS — F1290 Cannabis use, unspecified, uncomplicated: Secondary | ICD-10-CM | POA: Diagnosis not present

## 2023-10-30 DIAGNOSIS — F603 Borderline personality disorder: Secondary | ICD-10-CM | POA: Diagnosis present

## 2023-10-30 DIAGNOSIS — F1210 Cannabis abuse, uncomplicated: Secondary | ICD-10-CM | POA: Diagnosis present

## 2023-10-30 DIAGNOSIS — S61219A Laceration without foreign body of unspecified finger without damage to nail, initial encounter: Secondary | ICD-10-CM

## 2023-10-30 DIAGNOSIS — X780XXA Intentional self-harm by sharp glass, initial encounter: Secondary | ICD-10-CM | POA: Insufficient documentation

## 2023-10-30 DIAGNOSIS — S66801A Unspecified injury of other specified muscles, fascia and tendons at wrist and hand level, right hand, initial encounter: Secondary | ICD-10-CM

## 2023-10-30 LAB — BASIC METABOLIC PANEL
Anion gap: 11 (ref 5–15)
BUN: 15 mg/dL (ref 6–20)
CO2: 20 mmol/L — ABNORMAL LOW (ref 22–32)
Calcium: 10 mg/dL (ref 8.9–10.3)
Chloride: 106 mmol/L (ref 98–111)
Creatinine, Ser: 0.93 mg/dL (ref 0.44–1.00)
GFR, Estimated: >60 mL/min (ref >60)
Glucose, Bld: 76 mg/dL (ref 70–99)
Potassium: 3.6 mmol/L (ref 3.5–5.1)
Sodium: 137 mmol/L (ref 135–145)

## 2023-10-30 LAB — CBC WITH DIFFERENTIAL/PLATELET
Abs Immature Granulocytes: 0.03 10*3/uL (ref 0.00–0.07)
Basophils Absolute: 0 10*3/uL (ref 0.0–0.1)
Basophils Relative: 0 %
Eosinophils Absolute: 0 10*3/uL (ref 0.0–0.5)
Eosinophils Relative: 0 %
HCT: 40.7 % (ref 36.0–46.0)
Hemoglobin: 13.1 g/dL (ref 12.0–15.0)
Immature Granulocytes: 0 %
Lymphocytes Relative: 10 %
Lymphs Abs: 0.8 10*3/uL (ref 0.7–4.0)
MCH: 27.9 pg (ref 26.0–34.0)
MCHC: 32.2 g/dL (ref 30.0–36.0)
MCV: 86.6 fL (ref 80.0–100.0)
Monocytes Absolute: 0.7 10*3/uL (ref 0.1–1.0)
Monocytes Relative: 8 %
Neutro Abs: 7.1 10*3/uL (ref 1.7–7.7)
Neutrophils Relative %: 82 %
Platelets: 223 10*3/uL (ref 150–400)
RBC: 4.7 MIL/uL (ref 3.87–5.11)
RDW: 14.3 % (ref 11.5–15.5)
WBC: 8.6 10*3/uL (ref 4.0–10.5)
nRBC: 0 % (ref 0.0–0.2)

## 2023-10-30 LAB — HCG, SERUM, QUALITATIVE: Preg, Serum: NEGATIVE

## 2023-10-30 MED ORDER — STERILE WATER FOR INJECTION IJ SOLN
INTRAMUSCULAR | Status: AC
  Administered 2023-10-30: 1 mL
  Filled 2023-10-30: qty 10

## 2023-10-30 MED ORDER — ZIPRASIDONE MESYLATE 20 MG IM SOLR
20.0000 mg | Freq: Once | INTRAMUSCULAR | Status: AC
  Administered 2023-10-30: 20 mg via INTRAMUSCULAR
  Filled 2023-10-30: qty 20

## 2023-10-30 MED ORDER — CEFAZOLIN SODIUM-DEXTROSE 1-4 GM/50ML-% IV SOLN
1.0000 g | Freq: Once | INTRAVENOUS | Status: AC
  Administered 2023-10-30: 1 g via INTRAVENOUS
  Filled 2023-10-30: qty 50

## 2023-10-30 MED ORDER — TETANUS-DIPHTH-ACELL PERTUSSIS 5-2.5-18.5 LF-MCG/0.5 IM SUSY: 0.5000 mL | PREFILLED_SYRINGE | Freq: Once | INTRAMUSCULAR | Status: DC
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
## 2023-10-30 NOTE — ED Notes (Signed)
 Pt awake.  Hyperventilating and crying stating "I don't want to go to jail, I want to go home to see my babies"  This RN calmly speaking to patient, offered sprite and water per request.  MD notified of pt's status.

## 2023-10-30 NOTE — ED Notes (Addendum)
 Pt screaming, stating that this "[place]." Pt banged her head on the wall several times.
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
## 2023-10-30 NOTE — ED Notes (Signed)
 Notified by [employer] to make them aware when patient is discharged to let them know due to warrants for arrest.
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
## 2023-10-30 NOTE — ED Notes (Addendum)
 Wounds on L hand bandaged with petroleum gauze, non-adherent pads and coban.

## 2023-10-30 NOTE — ED Notes (Signed)
 Tele psych cart setup at bedside for TTS.
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
## 2023-10-30 NOTE — Consult Note (Signed)
 Provider is unable to assess patient at this time due to Geodon injection given here when patient became very agitated and aggressive and was banging her head on the wall.  We will allow her wake up and try assessment.

## 2023-10-31 DIAGNOSIS — F603 Borderline personality disorder: Secondary | ICD-10-CM

## 2023-10-31 DIAGNOSIS — F4312 Post-traumatic stress disorder, chronic: Secondary | ICD-10-CM

## 2023-10-31 DIAGNOSIS — F3130 Bipolar disorder, current episode depressed, mild or moderate severity, unspecified: Secondary | ICD-10-CM

## 2023-10-31 DIAGNOSIS — F1290 Cannabis use, unspecified, uncomplicated: Secondary | ICD-10-CM

## 2023-10-31 DIAGNOSIS — S61219A Laceration without foreign body of unspecified finger without damage to nail, initial encounter: Secondary | ICD-10-CM

## 2023-10-31 DIAGNOSIS — S66801A Unspecified injury of other specified muscles, fascia and tendons at wrist and hand level, right hand, initial encounter: Secondary | ICD-10-CM

## 2023-10-31 MED ORDER — ZIPRASIDONE MESYLATE 20 MG IM SOLR: 20.0000 mg | INTRAMUSCULAR | Status: AC

## 2023-10-31 MED ORDER — OLANZAPINE 10 MG IM SOLR
10.0000 mg | Freq: Four times a day (QID) | INTRAMUSCULAR | Status: DC | PRN
  Administered 2023-11-02: 10 mg via INTRAMUSCULAR
  Filled 2023-10-31 (×2): qty 10

## 2023-10-31 MED ORDER — LORAZEPAM 2 MG/ML IJ SOLN
2.0000 mg | Freq: Four times a day (QID) | INTRAMUSCULAR | Status: DC | PRN
  Administered 2023-10-31 – 2023-11-05 (×3): 2 mg via INTRAMUSCULAR
  Filled 2023-10-31 (×5): qty 1

## 2023-10-31 MED ORDER — OLANZAPINE 10 MG PO TBDP
10.0000 mg | ORAL_TABLET | Freq: Four times a day (QID) | ORAL | Status: DC | PRN
  Administered 2023-11-01: 10 mg via ORAL
  Filled 2023-10-31: qty 1

## 2023-10-31 MED ORDER — LIDOCAINE HCL 1 % IJ SOLN
INTRAMUSCULAR | Status: AC
  Filled 2023-10-31: qty 20

## 2023-10-31 MED ORDER — ZIPRASIDONE MESYLATE 20 MG IM SOLR
INTRAMUSCULAR | Status: AC
  Administered 2023-10-31: 20 mg via INTRAMUSCULAR
  Filled 2023-10-31: qty 20

## 2023-10-31 MED ORDER — DIPHENHYDRAMINE HCL 25 MG PO CAPS
50.0000 mg | ORAL_CAPSULE | Freq: Four times a day (QID) | ORAL | Status: DC | PRN
Start: 2023-10-31 — End: 2023-11-07
  Administered 2023-11-01 – 2023-11-02 (×3): 50 mg via ORAL
  Filled 2023-10-31 (×4): qty 2

## 2023-10-31 MED ORDER — CARIPRAZINE HCL 1.5 MG PO CAPS
1.5000 mg | ORAL_CAPSULE | ORAL | Status: DC
  Administered 2023-10-31 – 2023-11-01 (×2): 1.5 mg via ORAL
  Filled 2023-10-31 (×3): qty 1

## 2023-10-31 MED ORDER — DIPHENHYDRAMINE HCL 50 MG/ML IJ SOLN
50.0000 mg | Freq: Four times a day (QID) | INTRAMUSCULAR | Status: DC | PRN
  Administered 2023-10-31 – 2023-11-05 (×2): 50 mg via INTRAMUSCULAR
  Filled 2023-10-31 (×3): qty 1

## 2023-10-31 MED ORDER — LIDOCAINE HCL 2 % IJ SOLN
INTRAMUSCULAR | Status: AC
  Filled 2023-10-31: qty 20

## 2023-10-31 MED ORDER — LORAZEPAM 1 MG PO TABS
2.0000 mg | ORAL_TABLET | Freq: Four times a day (QID) | ORAL | Status: DC | PRN
  Administered 2023-11-01 – 2023-11-06 (×7): 2 mg via ORAL
  Filled 2023-10-31 (×8): qty 2

## 2023-10-31 MED ORDER — GABAPENTIN 100 MG PO CAPS
200.0000 mg | ORAL_CAPSULE | Freq: Three times a day (TID) | ORAL | Status: DC
  Administered 2023-10-31 – 2023-11-07 (×19): 200 mg via ORAL
  Filled 2023-10-31 (×21): qty 2

## 2023-10-31 MED ORDER — CLONAZEPAM 0.5 MG PO TABS
0.5000 mg | ORAL_TABLET | Freq: Two times a day (BID) | ORAL | Status: DC
  Administered 2023-10-31 – 2023-11-07 (×13): 0.5 mg via ORAL
  Filled 2023-10-31 (×13): qty 1

## 2023-10-31 NOTE — ED Notes (Signed)
 Patient woke up very upset and agitated. Pulling bed cords. RN and Sitter attempted to calm patient patient remain to be agitated. Will continue to monitor.

## 2023-10-31 NOTE — ED Notes (Addendum)
 [facility] was informed by the provider that pt is not going to have hand surgery after all and is recommended to follow up outpatient in about a weeks time. Per [employer], once pt has been psych cleared she will be transported to jail. [facility] spoke with [employer] to arrange for pt to be picked up and transported at 11:30am. [facility] informed [employer] that pt may require one on one attention to ensure that pt does not re-injure herself. [facility] spoke with the charge nurse and Dr. Anitra Lauth to inform them of the plan.   After speaking with the supervising psychiatrist, the provider is reconsidering recommending inpatient admission for the pt. [employer] was notified that the plan to transport pt is suspended until a determination is made whether pt will be admitted to an inpatient psychiatric facility.    Jacquelynn Cree, [facility]  10/31/23

## 2023-10-31 NOTE — ED Provider Notes (Signed)
 Dr. Eulah Pont called and reports that patient will not be getting surgery today and can follow-up with Dr. Laurence Compton next week.  Patient is upset by this information but did allow me to digitally block her fingers the wounds were cleaned and then she refused to allow me to suture them.  She can heal by secondary intention.  Xeroform gauze and loose dressing was placed and patient was placed in a finger splint.   Gwyneth Sprout, MD 10/31/23 1121

## 2023-10-31 NOTE — Progress Notes (Addendum)
 LCSW  Progress Note:  [id number]  Sharon Ward 10/31/2023 11:52 AM   Patient was recommended inpatient per Dahlia Byes NP. There are [facility] available beds at [facility], per [facility] Rona Ravens RN). Patient was referred to the following out of network facilities during this pm shift:   Destination  Service Provider   Address Phone Fax Patient Preferred  [facility]   [address], [place] [address] [phone] --  [facility]   [address] [place] [address] [phone] --  [facility]   [address] [place], [place] [address] [phone] --  [facility]   [address] [place] [address] [phone] --  [facility]   [address] [place] [address] [phone] --  [facility]   [address] [place] [address] [phone] --  [facility]   [address] [place], [place] [phone] --  [facility]   [address], [place] [address] [phone] --  [facility]   [address] [place], [place] [address] [phone] --  [facility]

## 2023-10-31 NOTE — ED Notes (Signed)
 Pt was agitated about being unable to connect with family over the phone, pt verbally abusive, grabbed personal fan and when Clinical research associate entered room to remove fan pt proceeded to Pharmacologist in the abdomen. Writer removed bedside table and other equipment from room, security called.

## 2023-10-31 NOTE — ED Notes (Addendum)
 Pt screaming out, upset due to someone having to observe pt in restroom. Pt tried to back away as if getting ready to run. Pt started to pick items up and throw (hitting staff), she refused to go back to room. When coaxed to room she threw bedside table and started grabbing things to throw. Pt yelling people are hurting her hand. No one had touch her fingers where injuries are. Pt fighting staff as restraints are being placed. Meds given, pt is secure in bed with rails up as she keeps thrashing about and yelling out.

## 2023-10-31 NOTE — ED Notes (Signed)
 Patient slamming her door and tried to be physically abusive to staff. Security called. Ativan and Benadryl given. Restraints applied. WIll continue to monitor.

## 2023-10-31 NOTE — ED Notes (Signed)
 Patient remain to be calm and cooperative. DC restraints at this time. Will continue to monitor.

## 2023-10-31 NOTE — ED Notes (Signed)
 MD Plunkett at bedside to suture patient

## 2023-10-31 NOTE — Consult Note (Signed)
 ORTHOPAEDIC CONSULTATION  REQUESTING PHYSICIAN: System, Provider Not In  Chief Complaint: left hand injury  HPI: Sharon Ward is a 28 y.o. female who complains of injury to the left index and middle fingers after cutting her hand on some broken glass. She has pain and limited ROM of the left index and middle fingers now.  Imaging shows no fracture or dislocations.    Orthopedics was consulted for evaluation.   Last meal before midnight. No history of MI, CVA, DVT, PE.  Previously ambulatory.  The patient is living at home.    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   Nicotine use disorder 10/20/2023   Prolonged QT interval 11/02/2020   PTSD (post-traumatic stress disorder) 10/24/2016   Pyelonephritis 06/27/2023   Relationship dysfunction    Suicide attempt (HCC) 11/02/2020   Toxic metabolic encephalopathy 04/05/2023   Past Surgical History:  Procedure Laterality Date   PILONIDAL CYST / SINUS EXCISION  09/11/2013   PILONIDAL CYST EXCISION  05/17/2014   Pilonidal cystectomy with cleft lip   Social History   Socioeconomic History   Marital status: Single    Spouse name: Not on file   Number of children: 1   Years of education: 10   Highest education level: 10th grade  Occupational History   Not on file  Tobacco Use   Smoking status: Former    Current packs/day: 0.00    Average packs/day: 1 pack/day for 15.0 years (15.0 ttl  pk-yrs)    Types: Cigarettes    Start date: 07/14/2006    Quit date: 07/14/2021    Years since quitting: 2.2   Smokeless tobacco: Never   Tobacco comments:    only smoke a "couple" of cigarettes when stressed or anxious, socially with friends per [facility] chart  Vaping Use   Vaping status: Former   Start date: 08/10/2003   Quit date: 05/04/2021  Substance and Sexual Activity   Alcohol use: Not Currently    Comment: occasional prior to pregnancy   Drug use: Yes    Frequency: 4.0 times per week    Types: Marijuana, Other-see comments    Comment: No Delta 9 since February 2023   Sexual activity: Not Currently    Partners: Male    Birth control/protection: None  Other Topics Concern   Not on file  Social History Narrative   Not on file   Social Drivers of Health   Financial Resource Strain: Not on file  Food Insecurity: No Food Insecurity (07/05/2023)   Hunger Vital Sign    Worried About Running Out of Food in the Last Year: Never true    Ran Out of Food in the Last Year: Never true  Transportation Needs: No Transportation Needs (07/05/2023)   PRAPARE - Administrator, Civil Service (Medical): No    Lack of Transportation (Non-Medical): No  Physical Activity: Not on file  Stress: Not on file  Social Connections: Not on  file   Family History  Problem Relation Age of Onset   Asthma Mother    Diabetes Mother    Healthy Mother    Hypertension Father    Asthma Father    Diabetes Father    Healthy Father    Asthma Brother    Hypertension Paternal Uncle    Diabetes Paternal Grandmother    Stroke Paternal Grandfather    Heart disease Paternal Grandfather    Hypertension Paternal Grandfather    Diabetes Paternal Grandfather    Allergies  Allergen Reactions   Ascorbate Rash   Ascorbic Acid Hives and Rash   Citrus Rash   Coconut Flavoring Agent (Non-Screening) Rash   Lamotrigine Rash and Hives    Other Reaction(s): Other (See Comments)  Unknown by patient    Latex Rash and Other (See Comments)    Skin turns red and breaks out where placed   Orange (Diagnostic) Rash   Peach Flavoring Agent (Non-Screening) Rash   Pear Rash   Pineapple Rash   Prunus Persica Hives and Rash   Tape Other (See Comments) and Rash    Skin turns red and breaks out where placed   Prior to Admission medications   Medication Sig Start Date End Date Taking? Authorizing Provider  cariprazine (VRAYLAR) 1.5 MG capsule Take 1 capsule (1.5 mg total) by mouth at bedtime. Patient not taking: Reported on 10/31/2023 10/20/23 11/24/23  Princess Bruins, DO   DG Hand 2 View Left Result Date: 10/30/2023 CLINICAL DATA:  Broken glass EXAM: LEFT HAND - 2 VIEW COMPARISON:  None Available. FINDINGS: There is no evidence of fracture or dislocation. There is no evidence of arthropathy or other focal bone abnormality. Second digit soft tissue edema. No retained radiopaque foreign body. IMPRESSION: 1.  No acute displaced fracture or dislocation. 2. No retained radiopaque foreign body. Electronically Signed   By: Tish Frederickson M.D.   On: 10/30/2023 17:14    Positive ROS: All other systems have been reviewed and were otherwise negative with the exception of those mentioned in the HPI and as above.  Objective: Labs cbc Recent Labs    10/30/23 1625  WBC 8.6  HGB 13.1  HCT 40.7  PLT 223    Labs inflam No results for input(s): "CRP" in the last 72 hours.  Invalid input(s): "ESR"  Labs coag No results for input(s): "INR", "PTT" in the last 72 hours.  Invalid input(s): "PT"  Recent Labs    10/30/23 1625  NA 137  K 3.6  CL 106  CO2 20*  GLUCOSE 76  BUN 15  CREATININE 0.93  CALCIUM 10.0    Physical Exam: Vitals:   10/31/23 0445 10/31/23 0518  BP:    Pulse: 66   Resp: 19   Temp:  98.4 F (36.9 C)  SpO2: 100%    General: Alert, no acute distress.  Laying in bed sleeping, easily awoken, calm, cooperative  Mental status: Alert and Oriented x3 Neurologic: Speech Clear and  organized, no gross focal findings or movement disorder appreciated. Respiratory: No cyanosis, no use of accessory musculature Cardiovascular: No pedal edema GI: Abdomen is soft and non-tender, non-distended. Skin: Warm and dry Extremities: Warm and well perfused w/o edema Psychiatric: calm  MUSCULOSKELETAL:  TTP left index and long fingers, dried blood covering fingers and hand, lacerations over the middle and distal phalanx of the left index and long fingers, no nail bed involvement, very weak flexion of left long finger FDS and FDP, minimal if any flexion of  the left index FDS and no flexion of the FDP, ROM of other fingers intact, NVI  Other extremities are atraumatic with painless ROM and NVI.  Assessment / Plan: Active Problems:   Suicidal ideation   Bipolar I disorder, most recent episode depressed (HCC)   Cannabis abuse   Chronic post-traumatic stress disorder (PTSD)   Laceration of finger of right hand without damage to nail   Injury of flexor tendon of right hand     She will need surgery to repair the flexor tendon of at least the left index finger by a hand surgeon. Have consulted with Dr. Kerry Fort who has agreed to take over care. Patient can follow up in his office.      Weightbearing: NWB LUE Insicional and dressing care: ED provider to suture lacerations if needed, otherwise soft dressing ok until sees hand surgeon; Dressings left intact until follow-up  Pain control: avoid narcotics; Tylenol 1000mg  q6h PRN and Ibuprofen 600mg  q8h PRN Follow - up plan:  needs appt ASAP with Dr. Kerry Fort in his office  Contact information for today:  Margarita Rana MD, [facility] PA-C  Jenne Pane PA-C Office [phone] 10/31/2023 9:54 AM

## 2023-10-31 NOTE — ED Notes (Signed)
 Pt ask to use restroom, sitter followed  patient to restroom. Patient verbally aggressive. Uncooperative at this time. Unable to verbally deescalate situation at this moment. Patient did not want to follow directions and became belligerent, hostile and aggressive throwing items around and towards staff. Security called for assistance.This RN notified MD Plunkett on situation. Verbal order given for medication.

## 2023-10-31 NOTE — ED Notes (Signed)
 Family updated as to patient's status.

## 2023-10-31 NOTE — Consult Note (Cosign Needed Addendum)
 Attempt to evaluate patient failed due to her inability to engage in evaluation. After her hand injuries were cleaned up and taped by DR Plunket patient got out of bed attempted to elope.  She was prevented from doing that she threw chair at staff, threw a sign at staff and started yelling.  Patient remains a danger to self and others.  Security and staff members are in the room.  Patient is back on Restraint and Geodon given.

## 2023-10-31 NOTE — ED Notes (Signed)
 Pt found to be standing at doorway, asked by this RN to come back to room and have a seat.  Pt appears very upset and anxious, pt yelling and started pacing in room and slamming table around.  Pt given 2mg  ativan and this RN able to talk patient into getting back in bed, given cold wash cloths d/t c/o of feeling hot.  Pt eventually calmed down and in bed with no further needs at this time.

## 2023-10-31 NOTE — Progress Notes (Signed)
 LCSW Progress Note  [id number]   Sharon Ward  10/31/2023  11:52 AM  Description:   Inpatient Psychiatric Referral  Patient was recommended inpatient per Dahlia Byes NP. There [facility] beds at [facility], per [facility] Rona Ravens RN). Patient was referred to the following out of network facilities:   [facility] Provider Address Phone Fax  [facility] [address] [place] [address] [phone]  [facility] [address] [place] [address] [phone]  [facility] [address] [place], [place] [address] [phone]  [facility] [address] [place] [address] [phone]  [facility] [address] [place] [address] [phone]  [facility] [address], [place] [address] [phone]  [facility] EFAX [address] [place], [place] [phone]  [facility] [address], [place] [address] [phone]  [facility] [address] [place] [address] [phone]  [facility] [address] [place] [address] [phone]      Situation ongoing, CSW to continue following and update chart as more information becomes available.      [place] Kieren Ricci, MSW, LCSW  10/31/2023 11:52 AM

## 2023-10-31 NOTE — ED Notes (Signed)
 Pt speaking to Tele-TTS.

## 2023-11-01 ENCOUNTER — Other Ambulatory Visit: Payer: Self-pay

## 2023-11-01 ENCOUNTER — Encounter (HOSPITAL_COMMUNITY): Payer: Self-pay | Admitting: Emergency Medicine

## 2023-11-01 DIAGNOSIS — R45851 Suicidal ideations: Secondary | ICD-10-CM

## 2023-11-01 MED ORDER — STERILE WATER FOR INJECTION IJ SOLN
INTRAMUSCULAR | Status: AC
  Filled 2023-11-01: qty 10

## 2023-11-01 NOTE — ED Notes (Signed)
 Patient irritable, agitated. Patient yelling at staff at times, redirected and reassured.

## 2023-11-01 NOTE — ED Notes (Signed)
 Patient is currently resting and remains calm and cooperative after being transferred back to the [facility]CU from [facility].

## 2023-11-01 NOTE — ED Notes (Signed)
 Patient to room 35. Patient ambulated to room.   Patient upset and tearful.   Patient oriented to unit and room.  Finger lacerations/ wounds cleaned and re bandaged.

## 2023-11-01 NOTE — ED Notes (Signed)
 Sharon Ward was yelling loud ly I can't stand this place because I have no one to talk to about my problems. Pt stating thoughts of suicide because she can't get out of TCU, and her children were taken from her because of some "bullshit" displaying loud yelling and slamming door into wall causing damage. Pt accusing this Clinical research associate of laugh at her statements of suicide she was making and became focused on that idea as a lead point to her tirade. She became anger went it was pointed out that she sees everyone is against her and that she has done nothing to desire it. Sharon Ward was asked if the decisions she was making had anything to do with her current situation. Sharon Ward repeatedly blames others for all her problems and lacks the ability to recognize her part and contributions to her currently life situation. She denies any responsibility  and blames everything on her family and social worker would she states is going to jail for lying on her

## 2023-11-01 NOTE — ED Notes (Signed)
 PRNs given .   50 mg Benadryl PO and 10 mg Zyprexa PO given .  PRN effective.

## 2023-11-01 NOTE — ED Notes (Signed)
 Patient sleeping at this time, Sitter at bedside

## 2023-11-01 NOTE — Progress Notes (Signed)
 LCSW Progress Note  [id number]   Sharon Ward  11/01/2023  2:59 PM  Description:   Inpatient Psychiatric Referral  Patient was recommended inpatient per Dahlia Byes NP. There [facility] beds [facility], per [facility] Victory Dakin RN). Patient was referred to the following out of network facilities:   [facility] Provider Address Phone Fax  [facility] [address] [place] [address] [phone]  [facility] [address] [place] [address] [phone]  [facility] [address] [place], [place] [address] [phone]  [facility] [address] [place] [address] [phone]  [facility] [address] [place] [address] [phone]  [facility] [address], [place] [address] [phone]  [facility] EFAX [address] [place], [place] [phone]  [facility] [address], [place] [address] [phone]  [facility] [address] [place] [address] [phone]  [facility] [address] [place] [address] [phone]      Situation ongoing, CSW to continue following and update chart as more information becomes available.      [place] Sharon Ward, MSW, LCSW  11/01/2023 2:59 PM

## 2023-11-01 NOTE — ED Provider Notes (Signed)
 Emergency Medicine Observation Re-evaluation Note  Azelie Noguera is a 28 y.o. female, seen on rounds today.  Pt initially presented to the ED for complaints of IVC and Extremity Laceration Currently, the patient is resting comfortably in her ED bed.  Physical Exam  BP 112/75 (BP Location: Left Arm)   Pulse 68   Temp 98.1 F (36.7 C) (Axillary)   Resp 16   LMP 10/10/2023 (Approximate)   SpO2 99%  Physical Exam General: Awake. Alert. No acute distress Cardiac: Regular rate rhythm Lungs: Clear to auscultation bilaterally Psych: Calm and cooperative  ED Course / MDM  EKG:EKG Interpretation Date/Time:  Sunday October 30 2023 16:45:46 EDT Ventricular Rate:  67 PR Interval:  136 QRS Duration:  92 QT Interval:  420 QTC Calculation: 444 R Axis:   39  Text Interpretation: Sinus rhythm Confirmed by Marily Memos [phone]) on 10/31/2023 7:13:18 PM  I have reviewed the labs performed to date as well as medications administered while in observation.  Recent changes in the last 24 hours include: patient was Agitated towards staff yesterday  Plan  Current plan is for continued monitoring in the ED awaiting psychiatric placement.    Royanne Foots, DO 11/01/23 0900

## 2023-11-01 NOTE — BH Assessment (Addendum)
 LCSW  Progress Note:   [id number]  Sharon Ward 11/01/2023 9:02 PM     Patient was recommended inpatient per Dahlia Byes NP. There [facility], per [facility] Rona Ravens RN). Patient was referred to the following out of network facilities during this pm shift:   Destination  Service Provider Request Status Services Address Phone Fax Patient Preferred  [facility] Pending - Request Sent -- [address] [place] [address] [phone] --  [facility] Pending - Request Sent -- [address], [place] [address] [phone] --  [facility] Pending - Request Sent -- [address] [place], [place] [address] [phone] --  [facility] Pending - Request Sent -- [address] [place] [address] [phone] --  [facility] Pending - Request Sent -- [address] [place] [address] [phone] --  [facility] Pending - Request Sent -- [address] [place] [address] [phone] --  [facility] Pending - Request Sent -- [address] [name] [phone] --  [facility] Pending - Request Sent -- [address], [place] [address] [phone] --  [facility] Pending - Request Sent -- [address] [place] [address] [phone] --  [facility] Pending - Request Sent -- [address], [place] [address] [phone] --
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
## 2023-11-01 NOTE — ED Notes (Signed)
 Patient banged her head against wall, patient was redirected, reassured and safety provided.   RN and security with patient. Patient given support.  Patient resting, breaths equal and unlabored.

## 2023-11-02 MED ORDER — HALOPERIDOL LACTATE 5 MG/ML IJ SOLN
5.0000 mg | Freq: Three times a day (TID) | INTRAMUSCULAR | Status: DC | PRN
  Administered 2023-11-05: 5 mg via INTRAMUSCULAR
  Filled 2023-11-02: qty 1

## 2023-11-02 MED ORDER — DIVALPROEX SODIUM 250 MG PO DR TAB
250.0000 mg | DELAYED_RELEASE_TABLET | Freq: Two times a day (BID) | ORAL | Status: DC
  Administered 2023-11-02 – 2023-11-04 (×5): 250 mg via ORAL
  Filled 2023-11-02 (×5): qty 1

## 2023-11-02 MED ORDER — HALOPERIDOL 5 MG PO TABS
5.0000 mg | ORAL_TABLET | Freq: Three times a day (TID) | ORAL | Status: DC | PRN
  Administered 2023-11-02 – 2023-11-07 (×3): 5 mg via ORAL
  Filled 2023-11-02 (×4): qty 1

## 2023-11-02 MED ORDER — HALOPERIDOL LACTATE 5 MG/ML IJ SOLN
5.0000 mg | Freq: Once | INTRAMUSCULAR | Status: DC
  Filled 2023-11-02: qty 1

## 2023-11-02 MED ORDER — STERILE WATER FOR INJECTION IJ SOLN
INTRAMUSCULAR | Status: AC
  Administered 2023-11-02: 2 mL
  Filled 2023-11-02: qty 10

## 2023-11-02 MED ORDER — CARMEX CLASSIC LIP BALM EX OINT
TOPICAL_OINTMENT | Freq: Once | CUTANEOUS | Status: DC
  Filled 2023-11-02: qty 10

## 2023-11-02 MED ORDER — LORAZEPAM 2 MG/ML IJ SOLN
2.0000 mg | Freq: Once | INTRAMUSCULAR | Status: DC
  Filled 2023-11-02: qty 1

## 2023-11-02 MED ORDER — LORAZEPAM 1 MG PO TABS
2.0000 mg | ORAL_TABLET | Freq: Once | ORAL | Status: AC
  Administered 2023-11-02: 2 mg via ORAL
  Filled 2023-11-02: qty 2
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
## 2023-11-02 NOTE — ED Notes (Signed)
 Patient has calmed down. It was explained that she would have to be given injectable medication as given earlier with continued outbursts.

## 2023-11-02 NOTE — ED Notes (Signed)
 NP has visited with patient   she is becoming more agitated with everything   she thinks she may be a better fit for [facility]   [facility] was going to ask Charge about having her moved to [place]

## 2023-11-02 NOTE — ED Notes (Signed)
 Pt agitated and wanting to talk to psychiatrist.

## 2023-11-02 NOTE — Progress Notes (Signed)
 LCSW Progress Note  [id number]   Sharon Ward  11/02/2023  9:20 AM  Description:   Inpatient Psychiatric Referral  Patient was recommended inpatient per  Dahlia Byes NP.There are no available beds at [facility], per [facility] Rona Ravens RN ). Patient was referred to the following out of network facilities:   [facility] Provider Address Phone Fax  [facility] [address] [place] [address] [phone]  [facility] [address] [place] [address] [phone]  [facility] [address] [place], [place] [address] [phone]  [facility] [address] [place] [address] [phone]  [facility] [address], [place] [address] [phone]  [facility] [address], [place] [address] [phone]  [facility] EFAX [address] [place], [place] [phone]  [facility] [address], [place] [address] [phone]  [facility] [address] [place] [address] [phone]  [facility] [address], [place] [address] [phone]      Situation ongoing, CSW to continue following and update chart as more information becomes available.     Sharon Ward  MSW, LCSW  11/02/2023 9:20 AM

## 2023-11-02 NOTE — Progress Notes (Signed)
 LCSW  Progress Note:   [id number]  Sharon Ward 11/01/2023 7:36 PM     Patient was recommended inpatient per Phebe Colla, NP. There are no available beds at [facility], per [facility] Rona Ravens RN). Patient was referred to the following out of network facilities during this pm shift:   Destination  Service Provider   Address Phone Fax Patient Preferred  [facility] [address]  -- [address] [place] [address] [phone] --  [facility]   [address] [place] [address] [phone] --  [facility]   [address] [place], [place] [address] [phone] --  [facility]   [address] [place] [address] [phone] --  [facility]   [address], [place] [address] [phone] --  [facility]   [address] [place] [address] [phone] --  [facility] EFAX   [address] [place], [place] [phone] --  [facility]   [address], [place] [address] [phone] --  [facility]   [address] [place], [place] [address] [phone] --  [facility]   [address] [place] [address] [phone] --

## 2023-11-02 NOTE — ED Notes (Signed)
Patient was given a sandwich and soda

## 2023-11-02 NOTE — Progress Notes (Addendum)
 LCSW  Progress Note:   [id number]  Sharon Ward 11/02/2023 7:50 PM  The accepting facility is [facility]. @ 7:50 PM, I received a call from Intake Coordinator Pete. The patient is only accepted to [facility] contingent upon her not receiving any IM agitation medications for 24 hours. Therefore, the facility has provided a bed offer for 11/04/2023 after 8 AM, provided she remains without IM agitation medications from now until that date. If she receives another IM medication during this time, the 24-hour period will restart. The accepting provider is Dr. Teofilo Pod. Nurse report can be reached at [phone] (pager number). Patient's care team provided disposition updates.

## 2023-11-02 NOTE — ED Notes (Signed)
 Patient has willingly taken her meds this morning   her bandages were replaced on her fingers due to getting wet in the shower    she has since then been having an outburst and becoming very angry with everyone

## 2023-11-02 NOTE — ED Notes (Signed)
 Patient pacing back and forth in [facility] area, stating that she wants to blow her brains out and die.Patient banging on walls, ripping mats off doorways, and throwing food around unit. Patient was given Haldol PO at 4pm.

## 2023-11-02 NOTE — ED Provider Notes (Signed)
 Emergency Medicine Observation Re-evaluation Note  Sharon Ward is a 28 y.o. female, seen on rounds today.  Pt initially presented to the ED for complaints of IVC and Extremity Laceration Currently, the patient is awaiting inpatient placement.  For hand plan is follow up with Dr. Ty Hilts next week. She did not allow suturing and plan for wound healing by secondary intention, finger splint, and hand surgery follow up.  Physical Exam  BP 104/61 (BP Location: Left Arm)   Pulse 62   Temp 97.8 F (36.6 C) (Oral)   Resp 18   LMP 10/10/2023 (Approximate)   SpO2 100%  Physical Exam General: NAD Cardiac: RR Lungs: even unlabored Psych: na  ED Course / MDM  EKG:EKG Interpretation Date/Time:  Sunday October 30 2023 16:45:46 EDT Ventricular Rate:  67 PR Interval:  136 QRS Duration:  92 QT Interval:  420 QTC Calculation: 444 R Axis:   39  Text Interpretation: Sinus rhythm Confirmed by Marily Memos [phone]) on 10/31/2023 7:13:18 PM  I have reviewed the labs performed to date as well as medications administered while in observation.  Recent changes in the last 24 hours include none.  Plan  Current plan is for inpatient psychiatric placement.    Alvira Monday, MD 11/02/23 [phone]
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
## 2023-11-02 NOTE — ED Notes (Signed)
 Patient moved to [facility]    all belongings were moved to locker 35

## 2023-11-03 NOTE — Progress Notes (Signed)
 Per Lyla Son from [facility] patient will not be able to admit to facility due to severe acute behaviors. Provider and RN made aware.   [place] Mikhail Hallenbeck LCSW-A   11/03/2023 10:15 AM

## 2023-11-03 NOTE — ED Notes (Signed)
 [facility] called pts friend [place] for collateral information. Brittany's phone was not accepting calls at this time. [facility] will try again.   Jacquelynn Cree, [facility]  11/03/23

## 2023-11-03 NOTE — ED Notes (Signed)
 Patient medication compliant this AM. Patient ate breakfast. Patient tried to make phone call.  Patient redirectable at this time. Patient in room.

## 2023-11-03 NOTE — Discharge Instructions (Addendum)
 You will need to see the hand surgeon as soon as possible.  His number is below.  Call to set up that appointment.  Take medications as prescribed.  Avoid drug and alcohol use.

## 2023-11-03 NOTE — ED Notes (Signed)
 [facility] called pts friend [place] again for collateral information. Brittany's phone is still not accepting calls at this time. [facility] will try again.    Jacquelynn Cree, [facility]   11/03/23

## 2023-11-03 NOTE — ED Notes (Signed)
 Patient talking calmly on the phone.   Patient cooperative with VS.

## 2023-11-03 NOTE — ED Notes (Signed)
 Patient took a phone call.   Patient now resting in bed, breaths equal and unlabored.

## 2023-11-03 NOTE — ED Provider Notes (Signed)
  Physical Exam  BP 110/67   Pulse (!) 56   Temp 97.6 F (36.4 C)   Resp 18   LMP 10/10/2023 (Approximate)   SpO2 99%   Physical Exam  Procedures  Procedures  ED Course / MDM    Medical Decision Making Amount and/or Complexity of Data Reviewed Labs: ordered. Radiology: ordered.  Risk Prescription drug management.   Has been accepted at [facility] pending 24 hours without I am medicines.  Last IM dose was 11 AM yesterday.  However note mentions potential placement on the 28th.  Nursing will be checking.       Benjiman Core, MD 11/03/23 [phone]

## 2023-11-03 NOTE — Consult Note (Signed)
 [facility] Consult Follow-up  Patient Name: .[address] Sharon Ward  MRN: [id number]  DOB: November 27, 1995  Consult Order details:  Orders (From admission, onward)     Start     Ordered   10/31/23 1536  CONSULT TO CALL ACT TEAM       Ordering Provider: Lorre Nick, MD  Provider:  (Not yet assigned)  Question:  Reason for Consult?  Answer:  EVALUATION   10/31/23 1536   10/30/23 1541  CONSULT TO CALL ACT TEAM       Ordering Provider: Glyn Ade, MD  Provider:  (Not yet assigned)  Question:  Reason for Consult?  Answer:  Psych consult   10/30/23 1540             Mode of Visit: In person    Psychiatry Consult Evaluation  Service Date: November 03, 2023 LOS:  LOS: 0 days  Chief Complaint Suicide ideation, Depression, Self harm behavior  Primary Psychiatric Diagnoses  Major Depressive disorder, recurrent severe without Psychotic features. 2.  Suicide attempt/Self harm behavior 3.  Borderline Personality disorder.  Assessment  Sharon Ward is a 28 y.o. female admitted: Presented to the EDfor 10/30/2023  2:59 PM for Self harm behavior cutting her fingers to the tendon. She carries the psychiatric diagnoses of Bipolar disorder, Depression, Borderline Personality disorder, PTSD, Cannabis abuse and suicide attempts and has a past medical history of Asthma.    On evaluation today, the patient is standing at the nurses station, upset, stating that she is ready to leave and she is tired of being here. This provider discussed with patient that she has to demonstrate "good behaviors" if she wants to be discharged.  Discussed with patient, the behaviors that she is displaying is not showing that she wants to be discharged.  Informed her that she cannot tear down doors, and throw tantrums in order to get her way.  Patient apologized for her behaviors, currently denies SI/HI/AVH.  During this assessment patient is calm and cooperative. Her appearance is appropriate for  environment. Her eye contact is good.  Speech is clear and coherent, normal pace and normal volume. she is alert and oriented x3 to person, place,  and situation. She reports her mood is "ok".  Affect is congruent with mood.  Thought process is coherent and disorganized.  No indication that she is responding to internal stimuli during this assessment.  No delusions elicited during this assessment.  Appetite and sleep are fair.  Per nursing staff patient has been compliant with medications, and has been cooperative on the unit with no aggression or behaviors this shift.  We will continue to observe patient and her behaviors, and attempt to get in contact with her collateral. Please see plan below for detailed recommendations.   Diagnoses:  Active Hospital problems: Principal Problem:   Suicidal ideation Active Problems:   Bipolar I disorder, most recent episode depressed (HCC)   Cannabis abuse   Borderline personality disorder (HCC)   Chronic post-traumatic stress disorder (PTSD)   Laceration of finger of right hand without damage to nail   Injury of flexor tendon of right hand    Plan   ## Psychiatric Medication Recommendations:  --Stop Vraylar 1.5 mg po daily due to patient voicing inability to afford this medication. --Continue Depakote 250mg  PO BID for mood --Continue Gabapentin 300 mg po tid for mood --Continue Klonopin 0.5 mg po bid for mood management --Offer PRN  Agitation protocol medications as needed.  ## Medical Decision Making  Capacity: Not specifically addressed in this encounter  ## Further Work-up:  -- most recent EKG 10/30/23 with QTC of 444 -- Pertinent labwork reviewed earlier this admission includes: CBC, CMP, UDS, Preg test   ## Disposition:--We will continue to observe patients behaviors overnight and reassess in morning, as her behavior has improved and she has been compliant with medications and has been in hospital since 10/30/23.   ## Behavioral /  Environmental: -Difficult Patient (SELECT OPTIONS FROM BELOW),  Patients with borderline personality traits/disorder often use the language of physical pain to communicate both physical and emotional suffering. It is important to address pain complaints as they arise and attempt to identify an etiology, either organic or psychiatric. In patients with chronic pain, it is important to have a discussion with the patient about expectations about pain control., To minimize splitting of staff, assign one staff person to communicate all information from the team when feasible., or Utilize compassion and acknowledge the patient's experiences while setting clear and realistic expectations for care.    ## Safety and Observation Level:  - Based on my clinical evaluation, I estimate the patient to be at moderate risk of self harm in the current setting. - At this time, we recommend  routine. This decision is based on my review of the chart including patient's history and current presentation, interview of the patient, mental status examination, and consideration of suicide risk including evaluating suicidal ideation, plan, intent, suicidal or self-harm behaviors, risk factors, and protective factors. This judgment is based on our ability to directly address suicide risk, implement suicide prevention strategies, and develop a safety plan while the patient is in the clinical setting. Please contact our team if there is a concern that risk level has changed.  CSSR Risk Category:   Suicide Risk Assessment: Patient has following modifiable risk factors for suicide: recklessness, we will monitor patient overnight. Patient has following non-modifiable or demographic risk factors for suicide: history of suicide attempt, history of self harm behavior, and psychiatric hospitalization Patient has the following protective factors against suicide: Supportive family, Minor children in the home, and no history of suicide  attempts  Thank you for this consult request. Recommendations have been communicated to the primary team.  We will observe patients behavior overnight as her behavior has improved at this time.   Desere Gwin MOTLEY-MANGRUM, PMHNP-PMHNP-BC       History of Present Illness  Relevant Aspects of Hospital ED Course:  Admitted on 10/30/2023 for Suicide ideation, Depression, Self harm behavior.    On evaluation today, the patient is standing at the nurses station, upset, stating that she is ready to leave and she is tired of being here. This provider discussed with patient that she has to demonstrate "good behaviors" if she wants to be discharged.  Discussed with patient, the behaviors that she is displaying is not showing that she wants to be discharged.  Informed her that she cannot tear down doors, and throw tantrums in order to get her way.  Patient apologized for her behaviors, currently denies SI/HI/AVH.  During this assessment patient is calm and cooperative. Her appearance is appropriate for environment. Her eye contact is good.  Speech is clear and coherent, normal pace and normal volume. she is alert and oriented x3 to person, place,  and situation. She reports her mood is "ok".  Affect is congruent with mood.  Thought process is coherent and disorganized.  No indication that she is responding to internal stimuli during this assessment.  No  delusions elicited during this assessment.  Appetite and sleep are fair.  Per nursing staff patient has been compliant with medications, and has been cooperative on the unit with no aggression or behaviors this shift.  We will continue to observe patient and her behaviors, and attempt to get in contact with her collateral. We will continue to observe patients behaviors overnight and reassess in morning, as her behavior has improved and she has been compliant with medications and has been in hospital since 10/30/23.    Psych ROS:  Depression: yes Anxiety:  yes Mania  (lifetime and current): na Psychosis: (lifetime and current): na  Collateral information:  Attempted to contact patients friends in demographics, no answer   Review of Systems  Negative except for injury to left index and middle fingers.     Psychiatric and Social History  Psychiatric History:  Information collected from Review of previous records  Prev Dx/Sx: see above Current Psych Provider: uta Home Meds (current): see above Previous Med Trials: Wellsite geologist, [employer] Therapy: uta  Prior Psych Hospitalization: yes  Prior Self Harm: yes-Multiple times Prior Violence: yes  Family Psych History: uta Family Hx suicide: uta  Social History:  Developmental Hx: wnl Educational Hx: uta Occupational Hx: Chief Financial Officer Hx: uta Living Situation: uta Spiritual Hx: uta Access to weapons/lethal means: Denies  Substance History Alcohol: uta  Tobacco: yes Illicit drugs: UDS +THC  Prescription drug abuse: uta Rehab hx: uta  Exam Findings  Physical Exam:  Vital Signs:  Temp:  [97.6 F (36.4 C)-97.8 F (36.6 C)] 97.6 F (36.4 C) (03/27 0653) Pulse Rate:  [56-65] 56 (03/27 0653) Resp:  [16-18] 18 (03/27 0653) BP: (108-113)/(65-76) 110/67 (03/27 0653) SpO2:  [98 %-100 %] 99 % (03/27 0653) Blood pressure 110/67, pulse (!) 56, temperature 97.6 F (36.4 C), resp. rate 18, last menstrual period 10/10/2023, SpO2 99%, unknown if currently breastfeeding. There is no height or weight on file to calculate BMI.  Physical Exam Constitutional:      Appearance: Normal appearance.  HENT:     Nose: Nose normal.  Eyes:     Pupils: Pupils are equal, round, and reactive to light.  Pulmonary:     Effort: Pulmonary effort is normal.  Skin:    General: Skin is dry.     Comments: Left hand self inflicted cuts  Neurological:     Mental Status: She is alert and oriented to person, place, and time.  Psychiatric:        Attention and Perception: Attention normal.        Mood and Affect: Mood is  anxious and depressed. Affect is labile and tearful.        Speech: Speech normal.        Behavior: Behavior is agitated. Behavior is cooperative.        Thought Content: Thought content normal.        Cognition and Memory: Cognition and memory normal.        Judgment: Judgment is impulsive.     Mental Status Exam: General Appearance: Casual and Neat  Orientation:  Full (Time, Place, and Person)  Memory:  Immediate;   Good Recent;   Good Remote;   Good  Concentration:  Fair  Recall:  Fair  Attention  Fair  Eye Contact:  Good  Speech:  Coherent   Language:  Good  Volume:  Normal  Mood: labile  Affect:  Appropriate   Thought Process:  Irrelevant and Descriptions of Associations: Circumstantial  Thought Content:  Illogical  Suicidal Thoughts:  Denies   Homicidal Thoughts:  No  Judgement:  Impaired  Insight:  Lacking  Psychomotor Activity: Normal  Akathisia:  NA  Fund of Knowledge:  Fair      Assets:  Communication Skills  Cognition:  WNL  ADL's:  Intact  AIMS (if indicated):        Other History   These have been pulled in through the EMR, reviewed, and updated if appropriate.  Family History:  The patient's family history includes Asthma in her brother, father, and mother; Diabetes in her father, mother, paternal grandfather, and paternal grandmother; Healthy in her father and mother; Heart disease in her paternal grandfather; Hypertension in her father, paternal grandfather, and paternal uncle; Stroke in her paternal grandfather.  Medical History: Past Medical History:  Diagnosis Date  . Acute respiratory failure with hypercapnia (HCC) 04/05/2023  . Asthma   . Bipolar 1 disorder, mixed (HCC)   . Cannabis use disorder 05/05/2021  . Depression   . GAD (generalized anxiety disorder) 09/18/2019  . Generalized anxiety disorder   . History of suicide attempts 12/31/2022   Documented h/o multiple serious suicide attempts  -hanging, OD    . Intentional drug overdose  (HCC) 11/03/2020  . Low-lying placenta 01/07/2022   Resolved 03/04/22  . 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 Nicotine use disorder 10/20/2023  . Prolonged QT interval 11/02/2020  . PTSD (post-traumatic stress disorder) 10/24/2016  . Pyelonephritis 06/27/2023  . Relationship dysfunction   . Suicide attempt (HCC) 11/02/2020  . Toxic metabolic encephalopathy 04/05/2023    Surgical History: Past Surgical History:  Procedure Laterality Date  . PILONIDAL CYST / SINUS EXCISION  09/11/2013  . PILONIDAL CYST EXCISION  05/17/2014   Pilonidal cystectomy with cleft lip     Medications:   Current Facility-Administered Medications:  .  clonazePAM (KLONOPIN) tablet 0.5 mg, 0.5 mg, Oral, BID, Onuoha, Josephine C, NP, 0.5 mg at 11/03/23 0934 .  diphenhydrAMINE (BENADRYL) capsule 50 mg, 50 mg, Oral, Q6H PRN, 50 mg at 11/02/23 1033 **OR** diphenhydrAMINE (BENADRYL) injection 50 mg, 50 mg, Intramuscular, Q6H PRN, Christell Constant, [place], NP, 50 mg at 10/31/23 1927 .  divalproex (DEPAKOTE) DR tablet 250 mg, 250 mg, Oral, BID, Weber, Kyra A, NP, 250 mg at 11/03/23 0934 .  gabapentin (NEURONTIN) capsule 200 mg, 200 mg, Oral, TID, Onuoha, Josephine C, NP, 200 mg at 11/03/23 1610 .  haloperidol (HALDOL) tablet 5 mg, 5 mg, Oral, Q8H PRN, 5 mg at 11/02/23 1601 **OR** haloperidol lactate (HALDOL) injection 5 mg, 5 mg, Intramuscular, Q8H PRN, Weber, Kyra A, NP .  lip balm (CARMEX) ointment, , Topical, Once, Pricilla Loveless, MD .  LORazepam (ATIVAN) tablet 2 mg, 2 mg, Oral, Q6H PRN, 2 mg at 11/02/23 1033 **OR** LORazepam (ATIVAN) injection 2 mg, 2 mg, Intramuscular, Q6H PRN, Christell Constant, [place] G, NP, 2 mg at 10/31/23 1927  Current Outpatient Medications:  .  cariprazine (VRAYLAR) 1.5 MG capsule, Take 1 capsule (1.5 mg total) by mouth at bedtime. (Patient not taking: Reported on 10/31/2023), Disp: 7 capsule, Rfl: 4  Allergies: Allergies  Allergen Reactions  .  Ascorbate Rash  . Ascorbic Acid Hives and Rash  . Citrus Rash  . Coconut Flavoring Agent (Non-Screening) Rash  . Lamotrigine Rash and Hives    Other Reaction(s): Other (See Comments)  Unknown by patient  . Latex Rash and Other (See Comments)    Skin turns red and breaks out where placed  .  Orange (Diagnostic) Rash  . Peach Flavoring Agent (Non-Screening) Rash  . Pear Rash  . Pineapple Rash  . Prunus Persica Hives and Rash  . Tape Other (See Comments) and Rash    Skin turns red and breaks out where placed    Yahir Tavano MOTLEY-MANGRUM, PMHNP-PMHNP-BC

## 2023-11-03 NOTE — Progress Notes (Signed)
 LCSW Progress Note  [id number]   Sharon Ward  11/03/2023  2:30 PM  Description:   Inpatient Psychiatric Referral  Patient was recommended inpatient per Phebe Colla.There are no available beds at [facility], per [facility] Malva Limes RN.Patient was referred to the following out of network facilities:    [facility] Provider Address Phone Fax  [facility] [address] [place] [address] [phone]  [facility] [address] [place] [address] [phone]  [facility] [address] [place], [place] [address] [phone]  [facility] [address] [place] [address] [phone]  [facility] [address] [place] [address] [phone]  [facility] [address], [place] [address] [phone]  [facility] EFAX [address] [place], [place] [phone]  [facility] [address], [place] [address] [phone]  [facility] [address] [place] [address] [phone]  [facility] [address], [place] [address] [phone]      Situation ongoing, CSW to continue following and update chart as more information becomes available.     [place] Jaycelyn Orrison, MSW, LCSW  11/03/2023 2:30 PM

## 2023-11-04 MED ORDER — STERILE WATER FOR INJECTION IJ SOLN
INTRAMUSCULAR | Status: AC
Start: 2023-11-04 — End: 2023-11-05
  Filled 2023-11-04: qty 10

## 2023-11-04 MED ORDER — ZIPRASIDONE MESYLATE 20 MG IM SOLR
20.0000 mg | Freq: Once | INTRAMUSCULAR | Status: AC
  Administered 2023-11-04: 20 mg via INTRAMUSCULAR
  Filled 2023-11-04: qty 20

## 2023-11-04 MED ORDER — STERILE WATER FOR INJECTION IJ SOLN
INTRAMUSCULAR | Status: AC
  Filled 2023-11-04: qty 10

## 2023-11-04 NOTE — ED Notes (Signed)
 Patient refusal to get finger injuries cleaned and redressed at this time.

## 2023-11-04 NOTE — ED Notes (Signed)
 Patient calm and cooperative on taking her VS. Patient went back to sleep after. Will continue to monitor.

## 2023-11-04 NOTE — ED Notes (Signed)
 Pt pacing and walking around unit screaming about when she's leaving and how its not fair that somebody else got to leave before her. Pt states " I am losing my mind." " All I have is this old ass fucking tv." Pt advised and educated multiple times that if she remains calm and collected that it would make the process less stressful. Pt screaming " nobody fucking cares about me."

## 2023-11-04 NOTE — ED Notes (Addendum)
 While this Clinical research associate was with another patient this patient continued to scream at this Clinical research associate and the sitter about being stuck here. Patient was advised of the process and how remaining calm and cooperative would make the process of getting placed easier. Patient began screaming at this Clinical research associate and mocking the way this Geophysicist/field seismologist. This Clinical research associate then went back to tending to the other patient. A few minutes later this patient started screaming louder at the sitter and this writer calling us " stupid fucking cunts" when we tried to explain that other patients were getting out of secure area for different reasons. Patient continues to scream about having nothing to do but walk around and be alone. While attempting to explain multiple other things to patient the patient then starts banging her head against the window. Security present advised patient that she would be medicated or restrained if she did not stop. Patient looks at Clinical research associate and states " I hope your babies throat gets cut."

## 2023-11-04 NOTE — Progress Notes (Addendum)
 LCSW  Progress Note:   [id number]  Sharon Ward 11/02/2023 7:19 PM   Per Alona Bene, NP, the patient has been recommended for inpatient psychiatric hospitalization following a recent medical hospitalization. The patient was involuntarily committed on 10/31/2023 at 4:15 p.m.  Lurena Joiner, the intake coordinator, stated that she would re-review the patient's admission offer. However, she raised concerns regarding the patient's need for orthopedic follow-up care. Lurena Joiner noted that admitting the patient to their facility could create a barrier to receiving the necessary orthopedic follow-up care within the recommended time frame, as outlined by the orthopedic team. She will discuss this matter with their provider and will call back if they are interested in accepting the patient.  As a result, the patient was re-referred to the following out-of-network facilities:  Destination  Service Provider   Address Phone Fax Patient Preferred  [facility]   [address], [place] [address] [phone] --  [facility]   [address] [place] [address] [phone] --  [facility]   [address] [place], [place] [address] [phone] --  [facility]   [address] [place] [address] [phone] --  [facility]   [address], [place] [address] [phone] --  [facility]   [address], [place] [address] [phone] --  [facility] EFAX   [address] [place], [place] [phone] --  [facility]   [address] [place] [address] [phone] --  [facility]   [address] [place], [place] [address] [phone] --  [facility]    [address] [place] [address] [phone] --

## 2023-11-04 NOTE — ED Notes (Incomplete)
 Patient got really upset when the [facility] NP told her she would not be discharge and was going inpatient.

## 2023-11-04 NOTE — Consult Note (Signed)
 [facility] Follow-[facility]  Patient Name: .[address] Sharon Ward  MRN: [id number]  DOB: May 15, 1996  Consult Order details:  Orders (From admission, onward)     Start     Ordered   10/31/23 1536  CONSULT TO CALL ACT TEAM       Ordering Provider: Lorre Nick, MD  Provider:  (Not yet assigned)  Question:  Reason for Consult?  Answer:  EVALUATION   10/31/23 1536   10/30/23 1541  CONSULT TO CALL ACT TEAM       Ordering Provider: Glyn Ade, MD  Provider:  (Not yet assigned)  Question:  Reason for Consult?  Answer:  Psych consult   10/30/23 1540             Mode of Visit: In person    Psychiatry Consult Evaluation  Service Date: November 04, 2023 LOS:  LOS: 0 days  Chief Complaint Suicide ideation, Depression, Self harm behavior  Primary Psychiatric Diagnoses  Major Depressive disorder, recurrent severe without Psychotic features. 2.  Suicide attempt/Self harm behavior 3.  Borderline Personality disorder.  Assessment  Sharon Ward is a 28 y.o. female admitted: Presented to the EDfor 10/30/2023  2:59 PM for Self harm behavior cutting her fingers to the tendon. She carries the psychiatric diagnoses of Bipolar disorder, Depression, Borderline Personality disorder, PTSD, Cannabis abuse and suicide attempts and has a past medical history of Asthma.    On evaluation today, the patient is standing at the nurses station, upset, stating that she is ready to leave and she is tired of being here. This provider discussed with patient that she has to demonstrate "good behaviors" if she wants to be discharged.  Discussed with patient, the behaviors that she is displaying is not showing that she wants to be discharged.  Informed her that she cannot tear down doors, and throw tantrums in order to get her way.  Patient apologized for her behaviors, currently denies SI/HI/AVH.  During this assessment patient is calm and cooperative. Her appearance is appropriate for  environment. Her eye contact is good.  Speech is clear and coherent, normal pace and normal volume. she is alert and oriented x3 to person, place,  and situation. She reports her mood is "ok".  Affect is congruent with mood. Per nursing staff patient has been compliant with medications.  We will continue to observe patient and her behaviors, and attempt to get in contact with her collateral. Please see plan below for detailed recommendations.   Diagnoses:  Active Hospital problems: Principal Problem:   Bipolar I disorder, most recent episode depressed (HCC) Active Problems:   Suicidal ideation   Cannabis abuse   Borderline personality disorder (HCC)   Chronic post-traumatic stress disorder (PTSD)   Laceration of finger of right hand without damage to nail   Injury of flexor tendon of right hand    Plan   ## Psychiatric Medication Recommendations:  --Stop Vraylar 1.5 mg po daily due to patient voicing inability to afford this medication. --Continue Depakote 250mg  PO BID for mood --Continue Gabapentin 300 mg po tid for mood --Continue Klonopin 0.5 mg po bid for mood management --Offer PRN  Agitation protocol medications as needed.  ## Medical Decision Making Capacity: Not specifically addressed in this encounter  ## Further Work-up:  -- most recent EKG 10/30/23 with QTC of 444 -- Pertinent labwork reviewed earlier this admission includes: CBC, CMP, UDS, Preg test   ## Disposition:--- We recommend inpatient psychiatric hospitalization after medical hospitalization. Patient has  been involuntarily committed on 10/31/2023.     ## Behavioral / Environmental: -Difficult Patient (SELECT OPTIONS FROM BELOW),  Patients with borderline personality traits/disorder often use the language of physical pain to communicate both physical and emotional suffering. It is important to address pain complaints as they arise and attempt to identify an etiology, either organic or psychiatric. In patients with  chronic pain, it is important to have a discussion with the patient about expectations about pain control., To minimize splitting of staff, assign one staff person to communicate all information from the team when feasible., or Utilize compassion and acknowledge the patient's experiences while setting clear and realistic expectations for care.    ## Behavioral / Environmental: -Difficult Patient (SELECT OPTIONS FROM BELOW),  Patients with borderline personality traits/disorder often use the language of physical pain to communicate both physical and emotional suffering. It is important to address pain complaints as they arise and attempt to identify an etiology, either organic or psychiatric. In patients with chronic pain, it is important to have a discussion with the patient about expectations about pain control., To minimize splitting of staff, assign one staff person to communicate all information from the team when feasible., or Utilize compassion and acknowledge the patient's experiences while setting clear and realistic expectations for care.                ## Safety and Observation Level:  - Based on my clinical evaluation, I estimate the patient to be at moderate risk of self harm in the current setting. - At this time, we recommend  routine. This decision is based on my review of the chart including patient's history and current presentation, interview of the patient, mental status examination, and consideration of suicide risk including evaluating suicidal ideation, plan, intent, suicidal or self-harm behaviors, risk factors, and protective factors. This judgment is based on our ability to directly address suicide risk, implement suicide prevention strategies, and develop a safety plan while the patient is in the clinical setting. Please contact our team if there is a concern that risk level has changed.   CSSR Risk Category:    Suicide Risk Assessment: Patient has following modifiable risk  factors for suicide: untreated depression, under treated depression , recklessness, medication noncompliance, and lack of access to outpatient mental health resources, which we are addressing by recommending inpatient Psychiatry hospitalization.. Patient has following non-modifiable or demographic risk factors for suicide: history of suicide attempt, history of self harm behavior, and psychiatric hospitalization Patient has the following protective factors against suicide: Supportive family, Minor children in the home, and no history of suicide attempts   Thank you for this consult request. Recommendations have been communicated to the primary team.  We will continue to round on the patient until she is hospitalized at this time.   Carlina Derks MOTLEY-MANGRUM, PMHNP-PMHNP-BC       History of Present Illness  Relevant Aspects of Hospital ED Course:  Admitted on 10/30/2023 for Suicide ideation, Depression, Self harm behavior.    On evaluation today, the patient is standing at the nurses station, upset, stating that she is ready to leave and she is tired of being here. This provider discussed with patient that she has to demonstrate "good behaviors" if she wants to be discharged.  Discussed with patient, the behaviors that she is displaying is not showing that she wants to be discharged.  Informed her that she cannot tear down doors, and throw tantrums in order to get her way.  Patient apologized  for her behaviors, currently denies SI/HI/AVH.  During this assessment patient is calm and cooperative. Her appearance is appropriate for environment. Her eye contact is good.  Speech is clear and coherent, normal pace and normal volume. she is alert and oriented x3 to person, place,  and situation. She reports her mood is "ok".  Affect is congruent with mood.  Thought process is coherent and disorganized.  No indication that she is responding to internal stimuli during this assessment.  No delusions elicited during this  assessment.  Appetite and sleep are fair.     Psych ROS:  Depression: yes Anxiety:  yes Mania (lifetime and current): na Psychosis: (lifetime and current): na  Collateral information:  Spoke with patient aunt Ursula Beath and she stated that she feels patient will be a danger to self, if she is discharged home. She states patient lacks impulse and self control. She is agreement that patient needs to go to an inpatient facility. She states she supports her niece and we can call whenever we need too.   Review of Systems  Negative except for injury to left index and middle fingers.     Psychiatric and Social History  Psychiatric History:  Information collected from Review of previous records  Prev Dx/Sx: see above Current Psych Provider: uta Home Meds (current): see above Previous Med Trials: Wellsite geologist, [employer] Therapy: uta  Prior Psych Hospitalization: yes  Prior Self Harm: yes-Multiple times Prior Violence: yes  Family Psych History: uta Family Hx suicide: uta  Social History:  Developmental Hx: wnl Educational Hx: uta Occupational Hx: uta Legal Hx: uta Living Situation: uta Spiritual Hx: uta Access to weapons/lethal means: Denies  Substance History Alcohol: uta  Tobacco: yes Illicit drugs: UDS +THC  Prescription drug abuse: uta Rehab hx: uta  Exam Findings  Physical Exam:  Vital Signs:  Temp:  [97.6 F (36.4 C)-98.5 F (36.9 C)] 97.6 F (36.4 C) (03/28 0944) Pulse Rate:  [85-87] 87 (03/28 0944) Resp:  [16-18] 18 (03/28 0944) BP: (122-124)/(63-88) 122/63 (03/28 0944) SpO2:  [99 %-100 %] 99 % (03/28 0944) Blood pressure 122/63, pulse 87, temperature 97.6 F (36.4 C), temperature source Oral, resp. rate 18, last menstrual period 10/10/2023, SpO2 99%, unknown if currently breastfeeding. There is no height or weight on file to calculate BMI.  Physical Exam Constitutional:      Appearance: Normal appearance.  HENT:     Nose: Nose normal.  Eyes:      Pupils: Pupils are equal, round, and reactive to light.  Pulmonary:     Effort: Pulmonary effort is normal.  Skin:    General: Skin is dry.     Comments: Left hand self inflicted cuts  Neurological:     Mental Status: She is alert and oriented to person, place, and time.  Psychiatric:        Attention and Perception: Attention normal.        Mood and Affect: Mood is anxious and depressed. Affect is labile and tearful.        Speech: Speech normal.        Behavior: Behavior is agitated. Behavior is cooperative.        Thought Content: Thought content normal.        Cognition and Memory: Cognition and memory normal.        Judgment: Judgment is impulsive.     Mental Status Exam: General Appearance: Casual and Neat  Orientation:  Full (Time, Place, and Person)  Memory:  Immediate;  Good Recent;   Good Remote;   Good  Concentration:  Fair  Recall:  Fair  Attention  Fair  Eye Contact:  Good  Speech:  Coherent   Language:  Good  Volume:  Normal  Mood: labile  Affect:  Appropriate   Thought Process:  Irrelevant and Descriptions of Associations: Circumstantial  Thought Content:  Illogical  Suicidal Thoughts:  Denies   Homicidal Thoughts:  No  Judgement:  Impaired  Insight:  Lacking  Psychomotor Activity: Normal  Akathisia:  NA  Fund of Knowledge:  Fair      Assets:  Manufacturing systems engineer  Cognition:  WNL  ADL's:  Intact  AIMS (if indicated):        Other History   These have been pulled in through the EMR, reviewed, and updated if appropriate.  Family History:  The patient's family history includes Asthma in her brother, father, and mother; Diabetes in her father, mother, paternal grandfather, and paternal grandmother; Healthy in her father and mother; Heart disease in her paternal grandfather; Hypertension in her father, paternal grandfather, and paternal uncle; Stroke in her paternal grandfather.  Medical History: Past Medical History:  Diagnosis Date  . Acute  respiratory failure with hypercapnia (HCC) 04/05/2023  . Asthma   . Bipolar 1 disorder, mixed (HCC)   . Cannabis use disorder 05/05/2021  . Depression   . GAD (generalized anxiety disorder) 09/18/2019  . Generalized anxiety disorder   . History of suicide attempts 12/31/2022   Documented h/o multiple serious suicide attempts  -hanging, OD    . Intentional drug overdose (HCC) 11/03/2020  . Low-lying placenta 01/07/2022   Resolved 03/04/22  . 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 Nicotine use disorder 10/20/2023  . Prolonged QT interval 11/02/2020  . PTSD (post-traumatic stress disorder) 10/24/2016  . Pyelonephritis 06/27/2023  . Relationship dysfunction   . Suicide attempt (HCC) 11/02/2020  . Toxic metabolic encephalopathy 04/05/2023    Surgical History: Past Surgical History:  Procedure Laterality Date  . PILONIDAL CYST / SINUS EXCISION  09/11/2013  . PILONIDAL CYST EXCISION  05/17/2014   Pilonidal cystectomy with cleft lip     Medications:   Current Facility-Administered Medications:  .  clonazePAM (KLONOPIN) tablet 0.5 mg, 0.5 mg, Oral, BID, Onuoha, Josephine C, NP, 0.5 mg at 11/04/23 0934 .  diphenhydrAMINE (BENADRYL) capsule 50 mg, 50 mg, Oral, Q6H PRN, 50 mg at 11/02/23 1033 **OR** diphenhydrAMINE (BENADRYL) injection 50 mg, 50 mg, Intramuscular, Q6H PRN, Christell Constant, [place], NP, 50 mg at 10/31/23 1927 .  divalproex (DEPAKOTE) DR tablet 250 mg, 250 mg, Oral, BID, Weber, Kyra A, NP, 250 mg at 11/04/23 0934 .  gabapentin (NEURONTIN) capsule 200 mg, 200 mg, Oral, TID, Onuoha, Josephine C, NP, 200 mg at 11/04/23 0934 .  haloperidol (HALDOL) tablet 5 mg, 5 mg, Oral, Q8H PRN, 5 mg at 11/02/23 1601 **OR** haloperidol lactate (HALDOL) injection 5 mg, 5 mg, Intramuscular, Q8H PRN, Weber, Kyra A, NP .  lip balm (CARMEX) ointment, , Topical, Once, Pricilla Loveless, MD .  LORazepam (ATIVAN) tablet 2 mg, 2 mg, Oral, Q6H PRN, 2 mg at  11/04/23 1129 **OR** LORazepam (ATIVAN) injection 2 mg, 2 mg, Intramuscular, Q6H PRN, Christell Constant, [place] G, NP, 2 mg at 10/31/23 1927  Current Outpatient Medications:  .  cariprazine (VRAYLAR) 1.5 MG capsule, Take 1 capsule (1.5 mg total) by mouth at bedtime. (Patient not taking: Reported on 10/31/2023), Disp: 7 capsule, Rfl: 4  Allergies: Allergies  Allergen Reactions  .  Ascorbate Rash  . Ascorbic Acid Hives and Rash  . Citrus Rash  . Coconut Flavoring Agent (Non-Screening) Rash  . Lamotrigine Rash and Hives    Other Reaction(s): Other (See Comments)  Unknown by patient  . Latex Rash and Other (See Comments)    Skin turns red and breaks out where placed  . Orange (Diagnostic) Rash  . Peach Flavoring Agent (Non-Screening) Rash  . Pear Rash  . Pineapple Rash  . Prunus Persica Hives and Rash  . Tape Other (See Comments) and Rash    Skin turns red and breaks out where placed    Yalissa Fink MOTLEY-MANGRUM, PMHNP-PMHNP-BC

## 2023-11-04 NOTE — ED Notes (Addendum)
 Patient continues screaming after medication administration and continues to beat on the wall. Asking " why are you doing this to me? This is abuse." Patient continues beating on the wall. Patient screams " I want to call my Aunt. I need to call my Aunt now!" Patient advised that right now that is not possible. Patient advised if she can calm down and be cooperative that she can try to call later. Patient states " You don't want to do that. I need to call my Aunt now. This is abuse. This is the worst mistake you could make."

## 2023-11-04 NOTE — ED Notes (Addendum)
 Patient calm and cooperative tonight. No signs of distress.

## 2023-11-04 NOTE — ED Notes (Signed)
 Patient finger laceration were covered with gauze and taped after taking a shower.

## 2023-11-04 NOTE — ED Provider Notes (Addendum)
 Emergency Medicine Observation Re-evaluation Note  Sharon Ward is a 28 y.o. female, seen on rounds today.  Pt initially presented to the ED for complaints of IVC and Extremity Laceration Currently, the patient is calm and cooperative.  Physical Exam  BP 122/63   Pulse 87   Temp 97.6 F (36.4 C) (Oral)   Resp 18   LMP 10/10/2023 (Approximate)   SpO2 99%  Physical Exam General: Awake. Alert. No acute distress Cardiac: Regular rate rhythm Lungs: Clear to auscultation bilaterally Psych: Calm and cooperative  ED Course / MDM  EKG:EKG Interpretation Date/Time:  Sunday October 30 2023 16:45:46 EDT Ventricular Rate:  67 PR Interval:  136 QRS Duration:  92 QT Interval:  420 QTC Calculation: 444 R Axis:   39  Text Interpretation: Sinus rhythm Confirmed by Marily Memos [phone]) on 10/31/2023 7:13:18 PM  I have reviewed the labs performed to date as well as medications administered while in observation.  Recent changes in the last 24 hours include no acute events.  Plan  Current plan is for continued boarding in the ED awaiting placement.  Plan regarding left hand injuries and lacerations: Plan for outpatient hand/orthopedic follow-up with Dr. Kerry Fort in 1-2 wks Nonweightbearing left upper extremity until cleared by orthopedics Keep left hand clean and dry and dressing should remain intact until she sees hand surgeon Patient had repeatedly refused laceration repair with suturing and wounds were left to close via secondary intention No evidence of infection that would merit antibiotics at this time Give Tylenol 1000 mg every 6 hours as needed and ibuprofen 600 mg every 8 hours as needed for pain and discomfort    Royanne Foots, DO 11/04/23 1108    Royanne Foots, DO 11/04/23 1433

## 2023-11-04 NOTE — ED Notes (Signed)
 Patient asked some medication to help her sleep. Ativan P.O. given. Will continue to monitor.

## 2023-11-04 NOTE — ED Notes (Signed)
 Patient awake at this time. Asked for cup of water. Patient calm and cooperative

## 2023-11-04 NOTE — ED Notes (Signed)
 ED Provider made aware of patient's behavior. IM medication orders placed. Security and TCU nurse went out to patient area. Patient continues screaming " I don't need a shot. She just gave me medication. " Patient continues to cry inconsolably. TCU RN Ladona Ridgel administered IM Geodon to patient's left deltoid without incident.

## 2023-11-04 NOTE — Progress Notes (Signed)
 LCSW Progress Note  [id number]   Sharon Ward  11/04/2023  10:24 AM  Description:   Inpatient Psychiatric Referral  Patient was recommended inpatient per Alona Bene, PMHNP  There are no available beds at [facility], per [facility] RN.Patient was referred to the following out of network facilities:   [facility] Provider Address Phone Fax  [facility] [address], [place] [address] [phone]  [facility] [address] [place] [address] [phone]  [facility] [address] [place], [place] [address] [phone]  [facility] [address] [place] [address] [phone]  [facility] [address] [place] [address] [phone]  [facility] [address] [place] [address] [phone]  [facility] EFAX [address] [place], [place] [phone]  [facility] [address], [place] [address] [phone]  [facility] [address] [place] [address] [phone]  [facility] [address], [place] [address] [phone]       Situation ongoing, CSW to continue following and update chart as more information becomes available.      Sharon Ward MSW, LCSW  11/04/2023 10:24 AM

## 2023-11-05 MED ORDER — PALIPERIDONE ER 3 MG PO TB24: 3.0000 mg | ORAL_TABLET | Freq: Every day | ORAL | Status: DC

## 2023-11-05 MED ORDER — HALOPERIDOL 1 MG PO TABS
2.0000 mg | ORAL_TABLET | Freq: Two times a day (BID) | ORAL | Status: DC
  Administered 2023-11-05: 2 mg via ORAL
  Filled 2023-11-05: qty 2

## 2023-11-05 MED ORDER — LORAZEPAM 0.5 MG PO TABS
0.5000 mg | ORAL_TABLET | Freq: Once | ORAL | Status: AC
  Administered 2023-11-05: 0.5 mg via ORAL
  Filled 2023-11-05: qty 1

## 2023-11-05 MED ORDER — BENZTROPINE MESYLATE 1 MG PO TABS
1.0000 mg | ORAL_TABLET | Freq: Two times a day (BID) | ORAL | Status: DC
  Administered 2023-11-05 – 2023-11-07 (×5): 1 mg via ORAL
  Filled 2023-11-05 (×5): qty 1

## 2023-11-05 MED ORDER — PALIPERIDONE ER 6 MG PO TB24
6.0000 mg | ORAL_TABLET | Freq: Every day | ORAL | Status: DC
  Administered 2023-11-06 – 2023-11-07 (×2): 6 mg via ORAL
  Filled 2023-11-05 (×2): qty 1

## 2023-11-05 MED ORDER — DIVALPROEX SODIUM 500 MG PO DR TAB
500.0000 mg | DELAYED_RELEASE_TABLET | Freq: Two times a day (BID) | ORAL | Status: DC
  Administered 2023-11-05 – 2023-11-07 (×5): 500 mg via ORAL
  Filled 2023-11-05 (×5): qty 1

## 2023-11-05 NOTE — ED Notes (Signed)
 Even after receiving IM medications, patient continues to escalate, kicking the door to the provider's office, yelling and cursing at staff. The patient has made numerous racist comments directed at the psych provider. She is not redirectable

## 2023-11-05 NOTE — Progress Notes (Signed)
 CSW spoke with Corrie Dandy the Intake Coordinator at [facility] who confirmed receiving [facility] referral but denies it being the complete referral. CSW sent four additional faxes to ensure the complete referral will be received. Per Corrie Dandy, she will consider the patient for priority once the full referral is reviewed.    Damita Dunnings, MSW, LCSW-A  5:38 PM 11/05/2023

## 2023-11-05 NOTE — ED Notes (Addendum)
 Pt's left ring, L finger cleansed with saline, dried, non-adherent dressing applied and secure with gauze.

## 2023-11-05 NOTE — ED Provider Notes (Signed)
 Emergency Medicine Observation Re-evaluation Note  Sharon Ward is a 28 y.o. female, seen on rounds today.  Pt initially presented to the ED for complaints of IVC and Extremity Laceration Currently, the patient is IVC .  Physical Exam  BP 103/65 (BP Location: Left Arm)   Pulse 91   Temp 97.6 F (36.4 C) (Oral)   Resp 16   LMP 10/10/2023 (Approximate)   SpO2 100%  Physical Exam General: nad Cardiac: rrr, normal bp Lungs: normal rr and sats Psych: calm  ED Course / MDM  EKG:EKG Interpretation Date/Time:  Sunday October 30 2023 16:45:46 EDT Ventricular Rate:  67 PR Interval:  136 QRS Duration:  92 QT Interval:  420 QTC Calculation: 444 R Axis:   39  Text Interpretation: Sinus rhythm Confirmed by Marily Memos [id number] on 10/31/2023 7:13:18 PM  I have reviewed the labs performed to date as well as medications administered while in observation.  Recent changes in the last 24 hours include normal work up and awaiting psyc.  Plan  Current plan is for ongoing symptom management with medication adjustments made. Per psych note: ".Disposition:--- We recommend inpatient psychiatric hospitalization after medical hospitalization. Patient has been involuntarily committed on 10/31/2023" Patient has been medically cleared      Margarita Grizzle, MD 11/05/23 [phone]

## 2023-11-05 NOTE — Progress Notes (Signed)
 CSW followed up with Corrie Dandy the Intake Coordinator at [facility]. Corrie Dandy confirms receiving the [facility] referral and has agreed to review. CSW completed verbal intake over the phone with Corrie Dandy to finalize the [facility] referral process. CSW will continue to monitor the referral to secure recommended disposition.   Damita Dunnings, MSW, LCSW-A  4:38 PM 11/05/2023

## 2023-11-05 NOTE — Consult Note (Signed)
 [facility] Consult Follow-up  Patient Name: .[address] Sharon Ward  MRN: [id number]  DOB: [birth year]  Consult Order details:  Orders (From admission, onward)     Start     Ordered   10/31/23 1536  CONSULT TO CALL ACT TEAM       Ordering Provider: Lorre Nick, MD  Provider:  (Not yet assigned)  Question:  Reason for Consult?  Answer:  EVALUATION   10/31/23 1536   10/30/23 1541  CONSULT TO CALL ACT TEAM       Ordering Provider: Glyn Ade, MD  Provider:  (Not yet assigned)  Question:  Reason for Consult?  Answer:  Psych consult   10/30/23 1540             Mode of Visit: In person    Psychiatry Consult Evaluation  Service Date: November 05, 2023 LOS:  LOS: 0 days  Chief Complaint Suicide ideation, Depression, Self harm behavior  Primary Psychiatric Diagnoses  Major Depressive disorder, recurrent severe without Psychotic features. 2.  Suicide attempt/Self harm behavior 3.  Borderline Personality disorder.  Assessment  Sharon Ward is a 28 y.o. female admitted: Presented to the EDfor 10/30/2023  2:59 PM for Self harm behavior cutting her fingers to the tendon. She carries the psychiatric diagnoses of Bipolar disorder, Depression, Borderline Personality disorder, PTSD, Cannabis abuse and suicide attempts and has a past medical history of Asthma.  On evaluation today and arrival of this provider patient was seen standing in front of the Nursing station as early as 8 am yelling, crying and highly agitated.  Patient is using obscene language towards staff.  Patient required multiple redirecting and explanation regarding why we cannot get a bed for her.  Patient suddenly started banging on the Provider office door and suddenly broke into the office but was stopped at the door and taken out.  Patient was at the medicated with PRN Injectable Medications.  She continued yelling but calmed down when breakfast came.  She ate and went back to sleep. This afternoon she  woke and again started same cycle.  Patient started crying, yelling and banging on the door.  Patient want to know why she has no inpatient bed.  RN and Provider explained to her that due to documentation of her behavior and needing injectable Medications facilities are not  offering her a bed.  Patient continues to yell, cry and curses.  Depakote is increased to 500 mg po bid for mood, Invega is started at 6 mg daily for mood as well.  We will obtain Depakote level in three days if she remain in the ER.  She is not responding to Internal stimuli and no Mention of Paranoia.   Please see plan below for detailed recommendations.   Diagnoses:  Active Hospital problems: Principal Problem:   Bipolar I disorder, most recent episode depressed (HCC) Active Problems:   Suicidal ideation   Cannabis abuse   Borderline personality disorder (HCC)   Chronic post-traumatic stress disorder (PTSD)   Laceration of finger of right hand without damage to nail   Injury of flexor tendon of right hand    Plan   ## Psychiatric Medication Recommendations:  --Start Invega 6 mg 24 hrs po daily --Increase  Depakote to 500 mg PO BID for mood --Continue Gabapentin 300 mg po tid for mood --Continue Klonopin 0.5 mg po bid for mood management --Offer PRN  Agitation protocol medications as needed.  ## Medical Decision Making Capacity: Not specifically addressed in this  encounter  ## Further Work-up:   Depakote level 11/09/2023. -- most recent EKG 10/30/23 with QTC of 444 -- Pertinent labwork reviewed earlier this admission includes: CBC, CMP, UDS, Preg test   ## Disposition:--- We recommend inpatient psychiatric hospitalization after medical hospitalization. Patient has been involuntarily committed on 10/31/2023.     ## Behavioral / Environmental: -Difficult Patient (SELECT OPTIONS FROM BELOW),  Patients with borderline personality traits/disorder often use the language of physical pain to communicate both physical  and emotional suffering. It is important to address pain complaints as they arise and attempt to identify an etiology, either organic or psychiatric. In patients with chronic pain, it is important to have a discussion with the patient about expectations about pain control., To minimize splitting of staff, assign one staff person to communicate all information from the team when feasible., or Utilize compassion and acknowledge the patient's experiences while setting clear and realistic expectations for care.    ## Behavioral / Environmental: -Difficult Patient (SELECT OPTIONS FROM BELOW),  Patients with borderline personality traits/disorder often use the language of physical pain to communicate both physical and emotional suffering. It is important to address pain complaints as they arise and attempt to identify an etiology, either organic or psychiatric. In patients with chronic pain, it is important to have a discussion with the patient about expectations about pain control., To minimize splitting of staff, assign one staff person to communicate all information from the team when feasible., or Utilize compassion and acknowledge the patient's experiences while setting clear and realistic expectations for care.                ## Safety and Observation Level:  - Based on my clinical evaluation, I estimate the patient to be at moderate risk of self harm in the current setting. - At this time, we recommend  routine. This decision is based on my review of the chart including patient's history and current presentation, interview of the patient, mental status examination, and consideration of suicide risk including evaluating suicidal ideation, plan, intent, suicidal or self-harm behaviors, risk factors, and protective factors. This judgment is based on our ability to directly address suicide risk, implement suicide prevention strategies, and develop a safety plan while the patient is in the clinical setting.  Please contact our team if there is a concern that risk level has changed.   CSSR Risk Category:    Suicide Risk Assessment: Patient has following modifiable risk factors for suicide: untreated depression, under treated depression , recklessness, medication noncompliance, and lack of access to outpatient mental health resources, which we are addressing by recommending inpatient Psychiatry hospitalization.. Patient has following non-modifiable or demographic risk factors for suicide: history of suicide attempt, history of self harm behavior, and psychiatric hospitalization Patient has the following protective factors against suicide: Supportive family, Minor children in the home, and no history of suicide attempts   Thank you for this consult request. Recommendations have been communicated to the primary team.  We will continue to round on the patient until she is hospitalized at this time.   Earney Navy, NP-PMHNP-BC       History of Present Illness  Relevant Aspects of Hospital ED Course:  Admitted on 10/30/2023 for Suicide ideation, Depression, Self harm behavior.    On evaluation today and arrival of this provider patient was seen standing in front of the Nursing station as early as 8 am yelling, crying and highly agitated.  Patient is using obscene language towards staff.  Patient required multiple redirecting and explanation regarding why we cannot get a bed for her.  Patient suddenly started banging on the Provider office door and suddenly broke into the office but was stopped at the door and taken out.  Patient was at the medicated with PRN Injectable Medications.  She continued yelling but calmed down when breakfast came.  She ate and went back to sleep. This afternoon she woke and again started same cycle.  Patient started crying, yelling and banging on the door.  Patient want to know why she has no inpatient bed.  RN and Provider explained to her that due to documentation of her  behavior and needing injectable Medications facilities are not  offering her a bed.  Patient continues to yell, cry and curses.  Depakote is increased to 500 mg po bid for mood, Invega is started at 6 mg daily for mood as well.  We will obtain Depakote level in three days if she remain in the ER.  She is not responding to Internal stimuli and no Mention of Paranoia.  Psych ROS:  Depression: yes Anxiety:  yes Mania (lifetime and current): na Psychosis: (lifetime and current): na  Collateral information:  None required today.  Another provider spoke to aunt aleaready and she is in agreement for inpatient Psychiatry hospitalization for patient.  Review of Systems  Negative except for injury to left index and middle fingers.     Psychiatric and Social History  Psychiatric History:  Information collected from Review of previous records  Prev Dx/Sx: see above Current Psych Provider: uta Home Meds (current): see above Previous Med Trials: Wellsite geologist, [employer] Therapy: uta  Prior Psych Hospitalization: yes  Prior Self Harm: yes-Multiple times Prior Violence: yes  Family Psych History: uta Family Hx suicide: uta  Social History:  Developmental Hx: wnl Educational Hx: uta Occupational Hx: uta Legal Hx: uta Living Situation: uta Spiritual Hx: uta Access to weapons/lethal means: Denies  Substance History Alcohol: uta  Tobacco: yes Illicit drugs: UDS +THC  Prescription drug abuse: uta Rehab hx: uta  Exam Findings  Physical Exam:  Vital Signs:  Temp:  [98 F (36.7 C)] 98 F (36.7 C) (03/29 0630) Pulse Rate:  [90] 90 (03/29 0630) Resp:  [16] 16 (03/29 0630) BP: (107)/(76) 107/76 (03/29 0630) SpO2:  [100 %] 100 % (03/29 0630) Blood pressure 107/76, pulse 90, temperature 98 F (36.7 C), temperature source Oral, resp. rate 16, last menstrual period 10/10/2023, SpO2 100%, unknown if currently breastfeeding. There is no height or weight on file to calculate BMI.  Physical  Exam Constitutional:      Appearance: Normal appearance.  HENT:     Nose: Nose normal.  Eyes:     Pupils: Pupils are equal, round, and reactive to light.  Pulmonary:     Effort: Pulmonary effort is normal.  Skin:    General: Skin is dry.     Comments: Left hand self inflicted cuts  Neurological:     Mental Status: She is alert and oriented to person, place, and time.  Psychiatric:        Attention and Perception: Attention normal.        Mood and Affect: Mood is anxious and depressed. Affect is labile and tearful.        Speech: Speech normal.        Behavior: Behavior is agitated. Behavior is cooperative.        Thought Content: Thought content normal.        Cognition  and Memory: Cognition and memory normal.        Judgment: Judgment is impulsive.     Mental Status Exam: General Appearance: Casual and Neat  Orientation:  Full (Time, Place, and Person)  Memory:  Immediate;   Good Recent;   Good Remote;   Good  Concentration:  Fair  Recall:  Fair  Attention  Fair  Eye Contact:  Good  Speech:  Coherent   Language:  Good  Volume:  Normal  Mood: labile  Affect:  Appropriate   Thought Process:  Irrelevant and Descriptions of Associations: Circumstantial  Thought Content:  Illogical  Suicidal Thoughts:  Denies   Homicidal Thoughts:  No  Judgement:  Impaired  Insight:  Lacking  Psychomotor Activity: Normal  Akathisia:  NA  Fund of Knowledge:  Fair      Assets:  Manufacturing systems engineer  Cognition:  WNL  ADL's:  Intact  AIMS (if indicated):        Other History   These have been pulled in through the EMR, reviewed, and updated if appropriate.  Family History:  The patient's family history includes Asthma in her brother, father, and mother; Diabetes in her father, mother, paternal grandfather, and paternal grandmother; Healthy in her father and mother; Heart disease in her paternal grandfather; Hypertension in her father, paternal grandfather, and paternal uncle;  Stroke in her paternal grandfather.  Medical History: 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   Nicotine use disorder 10/20/2023   Prolonged QT interval 11/02/2020   PTSD (post-traumatic stress disorder) 10/24/2016   Pyelonephritis 06/27/2023   Relationship dysfunction    Suicide attempt (HCC) 11/02/2020   Toxic metabolic encephalopathy 04/05/2023    Surgical History: Past Surgical History:  Procedure Laterality Date   PILONIDAL CYST / SINUS EXCISION  09/11/2013   PILONIDAL CYST EXCISION  05/17/2014   Pilonidal cystectomy with cleft lip     Medications:   Current Facility-Administered Medications:    benztropine (COGENTIN) tablet 1 mg, 1 mg, Oral, BID, Kandance Yano C, NP, 1 mg at 11/05/23 0940   clonazePAM (KLONOPIN) tablet 0.5 mg, 0.5 mg, Oral, BID, Carlei Huang C, NP, 0.5 mg at 11/05/23 0939   diphenhydrAMINE (BENADRYL) capsule 50 mg, 50 mg, Oral, Q6H PRN, 50 mg at 11/02/23 1033 **OR** diphenhydrAMINE (BENADRYL) injection 50 mg, 50 mg, Intramuscular, Q6H PRN, Christell Constant, [place] G, NP, 50 mg at 11/05/23 0854   divalproex (DEPAKOTE) DR tablet 500 mg, 500 mg, Oral, BID, Abbey Veith C, NP, 500 mg at 11/05/23 0939   gabapentin (NEURONTIN) capsule 200 mg, 200 mg, Oral, TID, Lelia Jons C, NP, 200 mg at 11/05/23 0939   haloperidol (HALDOL) tablet 5 mg, 5 mg, Oral, Q8H PRN, 5 mg at 11/02/23 1601 **OR** haloperidol lactate (HALDOL) injection 5  mg, 5 mg, Intramuscular, Q8H PRN, Weber, Kyra A, NP, 5 mg at 11/05/23 0854   lip balm (CARMEX) ointment, , Topical, Once, Pricilla Loveless, MD   LORazepam (ATIVAN) tablet 2 mg, 2  mg, Oral, Q6H PRN, 2 mg at 11/04/23 1716 **OR** LORazepam (ATIVAN) injection 2 mg, 2 mg, Intramuscular, Q6H PRN, Christell Constant, [place] G, NP, 2 mg at 11/05/23 0854   [START ON 11/06/2023] paliperidone (INVEGA) 24 hr tablet 6 mg, 6 mg, Oral, Daily, Sohail Capraro C, NP  Current Outpatient Medications:    cariprazine (VRAYLAR) 1.5 MG capsule, Take 1 capsule (1.5 mg total) by mouth at bedtime. (Patient not taking: Reported on 10/31/2023), Disp: 7 capsule, Rfl: 4  Allergies: Allergies  Allergen Reactions   Ascorbate Rash   Ascorbic Acid Hives and Rash   Citrus Rash   Coconut Flavoring Agent (Non-Screening) Rash   Lamotrigine Rash and Hives    Other Reaction(s): Other (See Comments)  Unknown by patient   Latex Rash and Other (See Comments)    Skin turns red and breaks out where placed   Orange (Diagnostic) Rash   Peach Flavoring Agent (Non-Screening) Rash   Pear Rash   Pineapple Rash   Prunus Persica Hives and Rash   Tape Other (See Comments) and Rash    Skin turns red and breaks out where placed    Earney Navy, NP-PMHNP-BC

## 2023-11-05 NOTE — Progress Notes (Signed)
 CSW sent [facility] referral to [facility] for review per the request made during the AM Disposition Meeting. CSW will continue to monitor the referral to secure recommended disposition.    Damita Dunnings, MSW, LCSW-A  9:56 AM 11/05/2023

## 2023-11-05 NOTE — ED Notes (Signed)
 The patient has been agitated since we came on shift. She has been yelling and cursing wanting to know why it is taking so long for her to go somewhere besides here. She has not been redirectable. She began knocking repeatedly on the provider's door and eventually was able to cause the door to open.

## 2023-11-05 NOTE — ED Notes (Signed)
 The patient commented saying, "if I had known I would have to go through all of this I would have just blown my brains out"

## 2023-11-05 NOTE — ED Notes (Signed)
 Pt provided ham sandwich PM snack per request.

## 2023-11-06 NOTE — Progress Notes (Signed)
 Patient has been denied by [facility] due to no appropriate beds available. Patient meets [facility] inpatient criteria per Dahlia Byes, NP. Patient has been faxed out to the following facilities:   [facility] [address] [place] [address] [phone]  [facility] [address] [place] [address] [phone]  [facility] [address] [place], [place] [address] [phone]  [facility] [address] [place] [address] [phone]  [facility] [address] [place] [address] [phone]  [facility] [address] [place] [address] [phone]  [facility] EFAX [address] [place], [place] [phone]  [facility] [address] [place] [address] [phone]  [facility] [address] [place] [address] [phone]  [facility] [address] [place] [address] [phone]  [facility] [address], [place] [address] [phone]  [facility] [address] [place], [place] [address] [phone]  [facility] [address] [place], [place] [address] [phone]  [facility] Patient Placement [facility], [place] [phone]  [facility] [address] [place] [address] [phone]   Damita Dunnings, MSW, LCSW-A  4:56 PM 11/06/2023

## 2023-11-06 NOTE — ED Notes (Signed)
 Pt has been calm and cooperative during this shift, medication compliant, no aggressive behavior.

## 2023-11-06 NOTE — Consult Note (Signed)
 [facility]  Patient Name: .[id number] Notnamed Croucher  MRN: [id number]  DOB: 03/07/1996  Consult Order details:  Orders (From admission, onward)     Start     Ordered   10/31/23 1536  CONSULT TO CALL ACT TEAM       Ordering Provider: Lorre Nick, MD  Provider:  (Not yet assigned)  Question:  Reason for Consult?  Answer:  EVALUATION   10/31/23 1536   10/30/23 1541  CONSULT TO CALL ACT TEAM       Ordering Provider: Glyn Ade, MD  Provider:  (Not yet assigned)  Question:  Reason for Consult?  Answer:  Psych consult   10/30/23 1540             Mode of Visit: In person    Psychiatry Consult Evaluation  Service Date: November 06, 2023 LOS:  LOS: 0 days  Chief Complaint Suicide ideation, Depression, Self harm behavior  Primary Psychiatric Diagnoses  Major Depressive disorder, recurrent severe without Psychotic features. 2.  Suicide attempt/Self harm behavior 3.  Borderline Personality disorder.  Assessment  Sharon Ward is a 28 y.o. female admitted: Presented to the EDfor 10/30/2023  2:59 PM for Self harm behavior cutting her fingers to the tendon. She carries the psychiatric diagnoses of Bipolar disorder, Depression, Borderline Personality disorder, PTSD, Cannabis abuse and suicide attempts and has a past medical history of Asthma.   Please see plan below for detailed recommendations.   Diagnoses:  Active Hospital problems: Principal Problem:   Bipolar I disorder, most recent episode depressed (HCC) Active Problems:   Suicidal ideation   Cannabis abuse   Borderline personality disorder (HCC)   Chronic post-traumatic stress disorder (PTSD)   Laceration of finger of right hand without damage to nail   Injury of flexor tendon of right hand    Plan   ## Psychiatric Medication Recommendations:  --Continue  Invega 6 mg 24 hrs po daily --Continue  Depakote to 500 mg PO BID for mood --Continue Gabapentin 300 mg po tid for  mood --Continue Klonopin 0.5 mg po bid for mood management --Offer PRN  Agitation protocol medications as needed.  ## Medical Decision Making Capacity: Not specifically addressed in this encounter  ## Further Work-up:   Depakote level 11/09/2023. -- most recent EKG 10/30/23 with QTC of 444 -- Pertinent labwork reviewed earlier this admission includes: CBC, CMP, UDS, Preg test   ## Disposition:--- We recommend inpatient psychiatric.  Patient has been involuntarily committed on 10/31/2023.     ## Behavioral / Environmental: -Difficult Patient (SELECT OPTIONS FROM BELOW),  Patients with borderline personality traits/disorder often use the language of physical pain to communicate both physical and emotional suffering. It is important to address pain complaints as they arise and attempt to identify an etiology, either organic or psychiatric. In patients with chronic pain, it is important to have a discussion with the patient about expectations about pain control., To minimize splitting of staff, assign one staff person to communicate all information from the team when feasible., or Utilize compassion and acknowledge the patient's experiences while setting clear and realistic expectations for care.    ## Behavioral / Environmental: -Difficult Patient (SELECT OPTIONS FROM BELOW),  Patients with borderline personality traits/disorder often use the language of physical pain to communicate both physical and emotional suffering. It is important to address pain complaints as they arise and attempt to identify an etiology, either organic or psychiatric. In patients with chronic pain, it is important to have  a discussion with the patient about expectations about pain control., To minimize splitting of staff, assign one staff person to communicate all information from the team when feasible., or Utilize compassion and acknowledge the patient's experiences while setting clear and realistic expectations for care.                 ## Safety and Observation Level:  - Based on my clinical evaluation, I estimate the patient to be at moderate risk of self harm in the current setting. - At this time, we recommend  routine. This decision is based on my review of the chart including patient's history and current presentation, interview of the patient, mental status examination, and consideration of suicide risk including evaluating suicidal ideation, plan, intent, suicidal or self-harm behaviors, risk factors, and protective factors. This judgment is based on our ability to directly address suicide risk, implement suicide prevention strategies, and develop a safety plan while the patient is in the clinical setting. Please contact our team if there is a concern that risk level has changed.   CSSR Risk Category:    Suicide Risk Assessment: Patient has following modifiable risk factors for suicide: untreated depression, under treated depression , recklessness, medication noncompliance, and lack of access to outpatient mental health resources, which we are addressing by recommending inpatient Psychiatry hospitalization.. Patient has following non-modifiable or demographic risk factors for suicide: history of suicide attempt, history of self harm behavior, and psychiatric hospitalization Patient has the following protective factors against suicide: Supportive family, Minor children in the home, and no history of suicide attempts   Thank you for this consult request. Recommendations have been communicated to the primary team.  We will continue to round on the patient until she is hospitalized at this time.   Earney Navy, NP-PMHNP-BC       History of Present Illness  Relevant Aspects of Hospital ED Course:  Admitted on 10/30/2023 for Suicide ideation, Depression, Self harm behavior.    On evaluation today Patient was calm, cooperative and has been taking oral Psychotropic Medications.  She is eating and drinking  and engages in meaningful conversation.  Patient slept well last night and she takes Intermittent naps in the day time.  Patient suddenly want to go inpatient Psychiatry treatment.  She is receiving Paliperidone 6 mg po daily.  This Medication has the benefit for LAI which is beneficial for the patient since she is never compliant with his Medications.  Patient was originally scheduled to go to the jail for legal Citations.  The wound in her hand is still healing although she refused sutures on the wound.  Tomorrow we will consider discharge to home or jail if she continues to remain stable. Psych ROS:  Depression: yes Anxiety:  yes Mania (lifetime and current): na Psychosis: (lifetime and current): na  Collateral information:  None required today.  Another provider spoke to aunt aleaready and she is in agreement for inpatient Psychiatry hospitalization for patient.  Review of Systems  Negative except for injury to left index and middle fingers.     Psychiatric and Social History  Psychiatric History:  Information collected from Review of previous records  Prev Dx/Sx: see above Current Psych Provider: uta Home Meds (current): see above Previous Med Trials: Vraylar, Abilify Therapy: uta  Prior Psych Hospitalization: yes  Prior Self Harm: yes-Multiple times Prior Violence: yes  Family Psych History: uta Family Hx suicide: uta  Social History:  Developmental Hx: wnl Educational Hx: uta Occupational Hx: unemployed  Legal Hx: uta Living Situation: uta Spiritual Hx: uta Access to weapons/lethal means: Denies  Substance History Alcohol: uta  Tobacco: yes Illicit drugs: UDS +THC  Prescription drug abuse: uta Rehab hx: uta  Exam Findings  Physical Exam:  Vital Signs:  Temp:  [97.6 F (36.4 C)-98.4 F (36.9 C)] 98.4 F (36.9 C) (03/30 1333) Pulse Rate:  [60-91] 82 (03/30 1333) Resp:  [16-18] 16 (03/30 1333) BP: (96-107)/(50-68) 107/68 (03/30 1333) SpO2:  [100 %] 100 %  (03/30 1333) Blood pressure 107/68, pulse 82, temperature 98.4 F (36.9 C), temperature source Oral, resp. rate 16, last menstrual period 10/10/2023, SpO2 100%, unknown if currently breastfeeding. There is no height or weight on file to calculate BMI.  Physical Exam Constitutional:      Appearance: Normal appearance.  HENT:     Nose: Nose normal.  Eyes:     Pupils: Pupils are equal, round, and reactive to light.  Pulmonary:     Effort: Pulmonary effort is normal.  Skin:    General: Skin is dry.     Comments: Left hand self inflicted cuts  Neurological:     Mental Status: She is alert and oriented to person, place, and time.  Psychiatric:        Attention and Perception: Attention normal.        Mood and Affect: Mood is anxious and depressed. Affect is labile and tearful.        Speech: Speech normal.        Behavior: Behavior is agitated. Behavior is cooperative.        Thought Content: Thought content normal.        Cognition and Memory: Cognition and memory normal.        Judgment: Judgment is impulsive.     Mental Status Exam: General Appearance: Casual and Neat  Orientation:  Full (Time, Place, and Person)  Memory:  Immediate;   Good Recent;   Good Remote;   Good  Concentration:  Fair  Recall:  Fair  Attention  Fair  Eye Contact:  Good  Speech:  Coherent   Language:  Good  Volume:  Normal  Mood: labile  Affect:  Appropriate   Thought Process:  Irrelevant and Descriptions of Associations: Circumstantial  Thought Content:  Illogical  Suicidal Thoughts:  Denies   Homicidal Thoughts:  No  Judgement:  Impaired  Insight:  Lacking  Psychomotor Activity: Normal  Akathisia:  NA  Fund of Knowledge:  Fair      Assets:  Manufacturing systems engineer  Cognition:  WNL  ADL's:  Intact  AIMS (if indicated):        Other History   These have been pulled in through the EMR, reviewed, and updated if appropriate.  Family History:  The patient's family history includes Asthma in  her brother, father, and mother; Diabetes in her father, mother, paternal grandfather, and paternal grandmother; Healthy in her father and mother; Heart disease in her paternal grandfather; Hypertension in her father, paternal grandfather, and paternal uncle; Stroke in her paternal grandfather.  Medical History: Past Medical History:  Diagnosis Date   Acute respiratory failure with hypercapnia (HCC) 04/05/2023   Asthma    Bipolar 1 disorder, mixed (HCC)    Cannabis use disorder 05/05/2021   Depression    GAD (generalized anxiety disorder) 09/18/2019   Generalized anxiety disorder    History of suicide attempts 12/31/2022   Documented h/o multiple serious suicide attempts  -hanging, OD     Intentional drug overdose (HCC)  11/03/2020   Low-lying placenta 01/07/2022   Resolved 03/04/22   Major depressive disorder, 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   Nicotine use disorder 10/20/2023   Prolonged QT interval 11/02/2020   PTSD (post-traumatic stress disorder) 10/24/2016   Pyelonephritis 06/27/2023   Relationship dysfunction    Suicide attempt (HCC) 11/02/2020   Toxic metabolic encephalopathy 04/05/2023    Surgical History: Past Surgical History:  Procedure Laterality Date   PILONIDAL CYST / SINUS EXCISION  09/11/2013   PILONIDAL CYST EXCISION  05/17/2014   Pilonidal cystectomy with cleft lip     Medications:   Current Facility-Administered Medications:    benztropine (COGENTIN) tablet 1 mg, 1 mg, Oral, BID, Cary Wilford C, NP, 1 mg at 11/06/23 0930   clonazePAM (KLONOPIN) tablet 0.5 mg, 0.5 mg, Oral, BID, Coburn Knaus C, NP, 0.5 mg at 11/06/23 0930   diphenhydrAMINE (BENADRYL) capsule 50 mg, 50 mg, Oral, Q6H PRN, 50 mg at 11/02/23 1033 **OR** diphenhydrAMINE (BENADRYL) injection 50 mg, 50 mg, Intramuscular, Q6H PRN, Christell Constant, [place], NP, 50 mg at 11/05/23 0854   divalproex (DEPAKOTE) DR tablet 500 mg, 500 mg, Oral, BID, Anamae Rochelle C, NP,  500 mg at 11/06/23 0930   gabapentin (NEURONTIN) capsule 200 mg, 200 mg, Oral, TID, Ferrah Panagopoulos C, NP, 200 mg at 11/06/23 1610   haloperidol (HALDOL) tablet 5 mg, 5 mg, Oral, Q8H PRN, 5 mg at 11/02/23 1601 **OR** haloperidol lactate (HALDOL) injection 5 mg, 5 mg, Intramuscular, Q8H PRN, Weber, Kyra A, NP, 5 mg at 11/05/23 0854   lip balm (CARMEX) ointment, , Topical, Once, Pricilla Loveless, MD   LORazepam (ATIVAN) tablet 2 mg, 2 mg, Oral, Q6H PRN, 2 mg at 11/06/23 1117 **OR** LORazepam (ATIVAN) injection 2 mg, 2 mg, Intramuscular, Q6H PRN, Christell Constant, [place] G, NP, 2 mg at 11/05/23 0854   paliperidone (INVEGA) 24 hr tablet 6 mg, 6 mg, Oral, Daily, Peyton Rossner C, NP, 6 mg at 11/06/23 0930  Current Outpatient Medications:    cariprazine (VRAYLAR) 1.5 MG capsule, Take 1 capsule (1.5 mg total) by mouth at bedtime. (Patient not taking: Reported on 10/31/2023), Disp: 7 capsule, Rfl: 4  Allergies: Allergies  Allergen Reactions   Ascorbate Rash   Ascorbic Acid Hives and Rash   Citrus Rash   Coconut Flavoring Agent (Non-Screening) Rash   Lamotrigine Rash and Hives    Other Reaction(s): Other (See Comments)  Unknown by patient   Latex Rash and Other (See Comments)    Skin turns red and breaks out where placed   Orange (Diagnostic) Rash   Peach Flavoring Agent (Non-Screening) Rash   Pear Rash   Pineapple Rash   Prunus Persica Hives and Rash   Tape Other (See Comments) and Rash    Skin turns red and breaks out where placed    Earney Navy, NP-PMHNP-BC

## 2023-11-06 NOTE — ED Notes (Signed)
 Pt took a shower

## 2023-11-06 NOTE — ED Notes (Signed)
Patient is resting now.

## 2023-11-06 NOTE — Progress Notes (Signed)
 CSW spoke with Corrie Dandy at [facility] who confirmed the patient is currently waitlisted and has been prioritized on the list. CSW will continue to follow-up to inquire about the referral status to secure recommended disposition.    Damita Dunnings, MSW, LCSW-A  5:50 PM 11/06/2023

## 2023-11-06 NOTE — ED Provider Notes (Signed)
 Emergency Medicine Observation Re-evaluation Note  Sharon Ward is a 28 y.o. female, seen on rounds today.  Pt initially presented to the ED for complaints of IVC and Extremity Laceration Currently, the patient is sitting in hallway.  Asking when she can leave.  Physical Exam  BP 97/63 (BP Location: Right Arm)   Pulse 88   Temp 98 F (36.7 C) (Oral)   Resp 16   LMP 10/10/2023 (Approximate)   SpO2 100%  Physical Exam   ED Course / MDM  EKG:EKG Interpretation Date/Time:  Sunday October 30 2023 16:45:46 EDT Ventricular Rate:  67 PR Interval:  136 QRS Duration:  92 QT Interval:  420 QTC Calculation: 444 R Axis:   39  Text Interpretation: Sinus rhythm Confirmed by Marily Memos [phone]) on 10/31/2023 7:13:18 PM  I have reviewed the labs performed to date as well as medications administered while in observation.  Recent changes in the last 24 hours include improved psychiatric condition.  Plan  Current plan is: [facility] has referred patient to [facility].  Patient however will be arrested when she leaves here.  Patient's hand injury is stable at this time.  Her mood and affect are improved.  I spoke with the psych provider and hopefully patient will be stable enough on Monday to go to the jail as the jail facility has a psychiatrist and also has medical care.Lorre Nick, MD 11/06/23 [phone]

## 2023-11-06 NOTE — Progress Notes (Signed)
 CSW spoke with Corrie Dandy at [facility] regarding the status of the [facility] referral submitted on 11/05/2023. Per Corrie Dandy, the referral is still waiting to be reviewed by a medical provider. CSW agreed to leave notice with the 1st CSW to follow-up.    Damita Dunnings, MSW, LCSW-A  5:26 PM 11/06/2023

## 2023-11-06 NOTE — ED Notes (Signed)
 Patient has requested something to calm her nerves    she sates she is getting tired of being here

## 2023-11-07 MED ORDER — BENZTROPINE MESYLATE 1 MG PO TABS: 1.0000 mg | ORAL_TABLET | Freq: Two times a day (BID) | ORAL | 0 refills | Status: DC

## 2023-11-07 MED ORDER — DIPHENHYDRAMINE HCL 50 MG PO CAPS: 50.0000 mg | ORAL_CAPSULE | Freq: Four times a day (QID) | ORAL | 0 refills | Status: DC | PRN

## 2023-11-07 MED ORDER — CARMEX CLASSIC LIP BALM EX OINT
TOPICAL_OINTMENT | CUTANEOUS | Status: DC | PRN
  Administered 2023-11-07: 1 via TOPICAL
  Filled 2023-11-07: qty 10

## 2023-11-07 MED ORDER — PALIPERIDONE ER 6 MG PO TB24: 6.0000 mg | ORAL_TABLET | Freq: Every day | ORAL | 0 refills | Status: DC

## 2023-11-07 MED ORDER — CLONAZEPAM 0.5 MG PO TABS: 0.5000 mg | ORAL_TABLET | Freq: Two times a day (BID) | ORAL | 0 refills | Status: DC

## 2023-11-07 MED ORDER — DIVALPROEX SODIUM 500 MG PO DR TAB: 500.0000 mg | DELAYED_RELEASE_TABLET | Freq: Two times a day (BID) | ORAL | 0 refills | Status: DC
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
## 2023-11-07 NOTE — ED Notes (Signed)
 Patient upset, states she is tired of waiting and she needs to go home because her grandfather is dying

## 2023-11-07 NOTE — ED Provider Notes (Addendum)
 Emergency Medicine Observation Re-evaluation Note  Sharon Ward is a 28 y.o. female, seen on rounds today.  Pt initially presented to the ED for complaints of IVC and Extremity Laceration Currently, the patient is standing at nursing station.  Physical Exam  BP 91/73 (BP Location: Right Arm)   Pulse (!) 102   Temp 97.6 F (36.4 C) (Oral)   Resp 16   LMP 10/10/2023 (Approximate)   SpO2 100%  Physical Exam General: Awake, alert, nondistressed Cardiac: Extremities well-perfused Lungs: Breathing is unlabored Psych: No agitation  ED Course / MDM  EKG:EKG Interpretation Date/Time:  Sunday October 30 2023 16:45:46 EDT Ventricular Rate:  67 PR Interval:  136 QRS Duration:  92 QT Interval:  420 QTC Calculation: 444 R Axis:   39  Text Interpretation: Sinus rhythm Confirmed by Marily Memos [phone]) on 10/31/2023 7:13:18 PM  I have reviewed the labs performed to date as well as medications administered while in observation.  Recent changes in the last 24 hours include reevaluation by TTS who recommends inpatient psychiatric admission.  After further TTS evaluation today, patient has been psychiatrically cleared.  Patient to be discharged to jail.  Plan  Current plan is discharge to jail    Gloris Manchester, MD 11/07/23 6045    Gloris Manchester, MD 11/07/23 1344

## 2023-11-07 NOTE — ED Notes (Signed)
 Patient requesting to call her grandma.  Patient is asked to wait a few minutes while report is being given, patient becoming tearful and upset, sitting on the floor.

## 2023-11-09 ENCOUNTER — Telehealth (HOSPITAL_COMMUNITY): Payer: Self-pay | Admitting: Licensed Clinical Social Worker

## 2023-11-09 NOTE — Telephone Encounter (Signed)
 Cln intended to call pt to f/u on PHP referral. Unable to due to due to pt not having a phone number on file.

## 2023-11-15 ENCOUNTER — Ambulatory Visit: Payer: MEDICAID | Admitting: Internal Medicine

## 2023-11-18 ENCOUNTER — Ambulatory Visit (HOSPITAL_COMMUNITY): Payer: MEDICAID | Admitting: Clinical

## 2023-11-18 ENCOUNTER — Encounter (HOSPITAL_COMMUNITY): Admitting: Student

## 2023-11-18 ENCOUNTER — Encounter (HOSPITAL_COMMUNITY): Payer: Self-pay

## 2023-11-22 ENCOUNTER — Telehealth (HOSPITAL_COMMUNITY): Payer: Self-pay

## 2023-11-22 ENCOUNTER — Telehealth (HOSPITAL_COMMUNITY): Payer: Self-pay | Admitting: Student

## 2023-11-22 NOTE — Telephone Encounter (Signed)
 PT called this after noon to reschedule Thursdays appt with Dr Eloy Half PT wanted the appointment for 4/16 Wednesday - I informed the PT that her provider was not here that day. The PT got frustrated and started screaming at me stating " I have to go out of town my dad is dying I need her to sign paperwork for my insurance so I can get my benefits. I apologized to the PT and asked her if she wanted to reschedule the appointment. The PT began screaming  at me on the phone, the PT was screaming so loud into the phone that I told her I could not understand her and again asked the PT if she wanted to reschedule the appointment - PT again began screaming and saying profanity. I ended the phone call.

## 2023-11-24 ENCOUNTER — Encounter (HOSPITAL_COMMUNITY): Payer: MEDICAID | Admitting: Student

## 2023-11-24 ENCOUNTER — Telehealth: Payer: Self-pay | Admitting: Internal Medicine

## 2023-11-24 NOTE — Telephone Encounter (Signed)
 Copied from CRM [id number]. Topic: General - Other >> Nov 24, 2023 10:04 AM Luane Rumps D wrote: Reason for CRM: Patient called to schedule an appointment, but also called regarding paperwork she needs signed by PCP for housing assistance. Advised patient that paperwork needing signing from PCP can take 7 business days, but patient expressed urgency for paper work being signed as she states she could lose her housing.  ---  Pt has appointment on 4.24.25

## 2023-11-25 NOTE — Telephone Encounter (Signed)
 [address] Sharon Ward @ [phone] - unable to get in contact  Unable to leave a vm because mail box has not been set up.   Future Appointments  Date Time Provider Department Center  12/01/2023  1:20 PM Roslyn Coombe, MD [facility] None  12/08/2023  2:00 PM Cozart, Angel Barba, LCSW [facility] None  01/06/2024 10:00 AM Malyk Girouard, DO [facility] None

## 2023-12-01 ENCOUNTER — Ambulatory Visit: Payer: MEDICAID | Admitting: Internal Medicine

## 2023-12-07 ENCOUNTER — Ambulatory Visit (HOSPITAL_COMMUNITY): Admission: RE | Admit: 2023-12-07 | Payer: MEDICAID | Source: Ambulatory Visit

## 2023-12-07 ENCOUNTER — Other Ambulatory Visit: Payer: Self-pay

## 2023-12-07 ENCOUNTER — Emergency Department (HOSPITAL_COMMUNITY)
Admission: EM | Admit: 2023-12-07 | Discharge: 2023-12-07 | Discharge status: Against Medical Advice | Payer: MEDICAID | Attending: Emergency Medicine | Admitting: Emergency Medicine

## 2023-12-07 ENCOUNTER — Encounter (HOSPITAL_COMMUNITY): Payer: Self-pay

## 2023-12-07 DIAGNOSIS — Z5321 Procedure and treatment not carried out due to patient leaving prior to being seen by health care provider: Secondary | ICD-10-CM

## 2023-12-07 DIAGNOSIS — R519 Headache, unspecified: Secondary | ICD-10-CM | POA: Diagnosis present

## 2023-12-07 DIAGNOSIS — Z5329 Procedure and treatment not carried out because of patient's decision for other reasons: Secondary | ICD-10-CM | POA: Insufficient documentation

## 2023-12-07 DIAGNOSIS — Z91040 Latex allergy status: Secondary | ICD-10-CM | POA: Insufficient documentation

## 2023-12-07 DIAGNOSIS — Y92009 Unspecified place in unspecified non-institutional (private) residence as the place of occurrence of the external cause: Secondary | ICD-10-CM | POA: Insufficient documentation

## 2023-12-07 LAB — I-STAT CHEM 8, ED
BUN: 13 mg/dL (ref 6–20)
Calcium, Ion: 1.18 mmol/L (ref 1.15–1.40)
Chloride: 105 mmol/L (ref 98–111)
Creatinine, Ser: 0.9 mg/dL (ref 0.44–1.00)
Glucose, Bld: 94 mg/dL (ref 70–99)
HCT: 39 % (ref 36.0–46.0)
Hemoglobin: 13.3 g/dL (ref 12.0–15.0)
Potassium: 3.3 mmol/L — ABNORMAL LOW (ref 3.5–5.1)
Sodium: 137 mmol/L (ref 135–145)
TCO2: 19 mmol/L — ABNORMAL LOW (ref 22–32)

## 2023-12-07 LAB — HCG, SERUM, QUALITATIVE: Preg, Serum: NEGATIVE
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
## 2023-12-08 ENCOUNTER — Ambulatory Visit (HOSPITAL_COMMUNITY): Payer: MEDICAID | Admitting: Clinical

## 2023-12-08 ENCOUNTER — Encounter (HOSPITAL_COMMUNITY): Payer: Self-pay

## 2023-12-09 ENCOUNTER — Telehealth (HOSPITAL_COMMUNITY): Payer: Self-pay
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
## 2023-12-09 NOTE — Telephone Encounter (Signed)
 PT called this morning wanting to Reschedule the No Show virtual appointment that was set for yesterday with her therapist - I explained to PT that due to No Shows I am unable to make an appointment and gave the PT Paiges walk in Days - The PT stated that she cannot make it to walk in days. I apologized for this and informed PT I could have my Supervisor reach out to her on Monday - Pt said " that's fine but just so you know if anything happens to me its not on me " I then informed both Phares Brasher and Adolm Ahumada of what the PT stated

## 2023-12-14 ENCOUNTER — Other Ambulatory Visit: Payer: Self-pay

## 2023-12-14 ENCOUNTER — Encounter (HOSPITAL_COMMUNITY): Payer: Self-pay

## 2023-12-14 ENCOUNTER — Inpatient Hospital Stay (HOSPITAL_COMMUNITY)
Admission: EM | Admit: 2023-12-14 | Discharge: 2023-12-18 | DRG: 918 | Discharge status: Other Acute Inpatient Hospital | Payer: MEDICAID | Attending: Internal Medicine | Admitting: Internal Medicine

## 2023-12-14 DIAGNOSIS — N179 Acute kidney failure, unspecified: Secondary | ICD-10-CM | POA: Insufficient documentation

## 2023-12-14 DIAGNOSIS — F411 Generalized anxiety disorder: Secondary | ICD-10-CM | POA: Diagnosis present

## 2023-12-14 DIAGNOSIS — Z91048 Other nonmedicinal substance allergy status: Secondary | ICD-10-CM

## 2023-12-14 DIAGNOSIS — T1491XA Suicide attempt, initial encounter: Principal | ICD-10-CM | POA: Insufficient documentation

## 2023-12-14 DIAGNOSIS — T43592A Poisoning by other antipsychotics and neuroleptics, intentional self-harm, initial encounter: Principal | ICD-10-CM | POA: Diagnosis present

## 2023-12-14 DIAGNOSIS — Z888 Allergy status to other drugs, medicaments and biological substances status: Secondary | ICD-10-CM

## 2023-12-14 DIAGNOSIS — F4310 Post-traumatic stress disorder, unspecified: Secondary | ICD-10-CM | POA: Diagnosis present

## 2023-12-14 DIAGNOSIS — F314 Bipolar disorder, current episode depressed, severe, without psychotic features: Secondary | ICD-10-CM | POA: Diagnosis present

## 2023-12-14 DIAGNOSIS — Z91018 Allergy to other foods: Secondary | ICD-10-CM

## 2023-12-14 DIAGNOSIS — Z555 Less than a high school diploma: Secondary | ICD-10-CM

## 2023-12-14 DIAGNOSIS — F603 Borderline personality disorder: Secondary | ICD-10-CM | POA: Diagnosis present

## 2023-12-14 DIAGNOSIS — Z9151 Personal history of suicidal behavior: Secondary | ICD-10-CM

## 2023-12-14 DIAGNOSIS — Z833 Family history of diabetes mellitus: Secondary | ICD-10-CM

## 2023-12-14 DIAGNOSIS — Z87891 Personal history of nicotine dependence: Secondary | ICD-10-CM

## 2023-12-14 DIAGNOSIS — E860 Dehydration: Secondary | ICD-10-CM | POA: Diagnosis present

## 2023-12-14 DIAGNOSIS — Z9102 Food additives allergy status: Secondary | ICD-10-CM

## 2023-12-14 DIAGNOSIS — Z91040 Latex allergy status: Secondary | ICD-10-CM

## 2023-12-14 DIAGNOSIS — Z8249 Family history of ischemic heart disease and other diseases of the circulatory system: Secondary | ICD-10-CM

## 2023-12-14 DIAGNOSIS — Z79899 Other long term (current) drug therapy: Secondary | ICD-10-CM

## 2023-12-14 DIAGNOSIS — E876 Hypokalemia: Secondary | ICD-10-CM | POA: Insufficient documentation

## 2023-12-14 DIAGNOSIS — T50901A Poisoning by unspecified drugs, medicaments and biological substances, accidental (unintentional), initial encounter: Secondary | ICD-10-CM | POA: Diagnosis present

## 2023-12-14 DIAGNOSIS — I959 Hypotension, unspecified: Secondary | ICD-10-CM | POA: Diagnosis not present

## 2023-12-14 LAB — CBC WITH DIFFERENTIAL/PLATELET
Abs Immature Granulocytes: 0.02 10*3/uL (ref 0.00–0.07)
Basophils Absolute: 0.1 10*3/uL (ref 0.0–0.1)
Basophils Relative: 1 %
Eosinophils Absolute: 0.1 10*3/uL (ref 0.0–0.5)
Eosinophils Relative: 2 %
HCT: 43.5 % (ref 36.0–46.0)
Hemoglobin: 14.1 g/dL (ref 12.0–15.0)
Immature Granulocytes: 0 %
Lymphocytes Relative: 26 %
Lymphs Abs: 1.8 10*3/uL (ref 0.7–4.0)
MCH: 27.6 pg (ref 26.0–34.0)
MCHC: 32.4 g/dL (ref 30.0–36.0)
MCV: 85.3 fL (ref 80.0–100.0)
Monocytes Absolute: 0.7 10*3/uL (ref 0.1–1.0)
Monocytes Relative: 10 %
Neutro Abs: 4.2 10*3/uL (ref 1.7–7.7)
Neutrophils Relative %: 61 %
Platelets: 426 10*3/uL — ABNORMAL HIGH (ref 150–400)
RBC: 5.1 MIL/uL (ref 3.87–5.11)
RDW: 14.6 % (ref 11.5–15.5)
WBC: 6.9 10*3/uL (ref 4.0–10.5)
nRBC: 0 % (ref 0.0–0.2)

## 2023-12-14 LAB — COMPREHENSIVE METABOLIC PANEL WITH GFR
ALT: 14 U/L (ref 0–44)
AST: 27 U/L (ref 15–41)
Albumin: 4.8 g/dL (ref 3.5–5.0)
Alkaline Phosphatase: 48 U/L (ref 38–126)
Anion gap: 16 — ABNORMAL HIGH (ref 5–15)
BUN: 12 mg/dL (ref 6–20)
CO2: 25 mmol/L (ref 22–32)
Calcium: 8.9 mg/dL (ref 8.9–10.3)
Chloride: 97 mmol/L — ABNORMAL LOW (ref 98–111)
Creatinine, Ser: 1.04 mg/dL — ABNORMAL HIGH (ref 0.44–1.00)
GFR, Estimated: >60 mL/min (ref >60)
Glucose, Bld: 94 mg/dL (ref 70–99)
Potassium: 3.1 mmol/L — ABNORMAL LOW (ref 3.5–5.1)
Sodium: 138 mmol/L (ref 135–145)
Total Bilirubin: 0.6 mg/dL (ref 0.0–1.2)
Total Protein: 8.1 g/dL (ref 6.5–8.1)

## 2023-12-14 LAB — MAGNESIUM: Magnesium: 1.9 mg/dL (ref 1.7–2.4)

## 2023-12-14 LAB — SALICYLATE LEVEL: Salicylate Lvl: <7 mg/dL — ABNORMAL LOW (ref 7.0–30.0)

## 2023-12-14 LAB — CBG MONITORING, ED: Glucose-Capillary: 105 mg/dL — ABNORMAL HIGH (ref 70–99)

## 2023-12-14 LAB — HCG, SERUM, QUALITATIVE: Preg, Serum: NEGATIVE

## 2023-12-14 LAB — VALPROIC ACID LEVEL: Valproic Acid Lvl: <10 ug/mL — ABNORMAL LOW (ref 50–100)

## 2023-12-14 LAB — AMMONIA: Ammonia: 26 umol/L (ref 9–35)

## 2023-12-14 LAB — ETHANOL: Alcohol, Ethyl (B): <15 mg/dL (ref <15)

## 2023-12-14 LAB — ACETAMINOPHEN LEVEL: Acetaminophen (Tylenol), Serum: <10 ug/mL — ABNORMAL LOW (ref 10–30)

## 2023-12-14 NOTE — ED Notes (Signed)
 IVC accepted, expires 5/14
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
## 2023-12-14 NOTE — ED Notes (Signed)
IVC in process  
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
## 2023-12-14 NOTE — ED Provider Notes (Signed)
  EMERGENCY DEPARTMENT AT [facility] Provider Note   CSN: [id number] Arrival date & time: 12/14/23  2132     History  Chief Complaint  Patient presents with   Drug Overdose    Sharon Ward is a 28 y.o. female.  Patient BIB [employer] and EMS after patient called [employer], sounded slurred and was initially reported as a potential stroke. On arrival, the patient was found unconscious, vomit on the bed, with an empty pill bottle of Abilify  found nearby. Also found on scene was a suicide note which included music the patient wished played at her funeral. The patient became combative during transport and required sedation. EMS reports they gave 5 mg Haldol , 5 mg Versed  and 400 mg ketamine.   The history is provided by the EMS personnel and the police.  Drug Overdose       Home Medications Prior to Admission medications   Medication Sig Start Date End Date Taking? Authorizing Provider  benztropine  (COGENTIN ) 1 MG tablet Take 1 tablet (1 mg total) by mouth 2 (two) times daily. 11/07/23 12/07/23  Onuoha, Josephine C, NP  clonazePAM  (KLONOPIN ) 0.5 MG tablet Take 1 tablet (0.5 mg total) by mouth 2 (two) times daily. 11/07/23   Onuoha, Josephine C, NP  diphenhydrAMINE  (BENADRYL ) 50 MG capsule Take 1 capsule (50 mg total) by mouth every 6 (six) hours as needed (agitation). 11/07/23   Onuoha, Josephine C, NP  divalproex  (DEPAKOTE ) 500 MG DR tablet Take 1 tablet (500 mg total) by mouth 2 (two) times daily. 11/07/23 12/07/23  Onuoha, Josephine C, NP  paliperidone  (INVEGA ) 6 MG 24 hr tablet Take 1 tablet (6 mg total) by mouth daily. 11/08/23 12/08/23  Onuoha, Josephine C, NP      Allergies    Ascorbate, Ascorbic acid, Citrus, Coconut flavoring agent (non-screening), Lamotrigine, Latex, Orange (diagnostic), Peach flavoring agent (non-screening), Pear, Pineapple, Prunus persica, and Tape    Review of Systems   Review of Systems  Physical Exam Updated Vital Signs BP 109/63   Pulse 80    Temp 97.6 F (36.4 C) (Temporal)   Resp (!) 22   SpO2 100%  Physical Exam Vitals and nursing note reviewed.  Constitutional:      Comments: Sedated, spontaneous respirations  HENT:     Head: Atraumatic.  Eyes:     Pupils: Pupils are equal, round, and reactive to light.     Comments: Conjugate gaze  Cardiovascular:     Rate and Rhythm: Normal rate.     Heart sounds: No murmur heard. Pulmonary:     Effort: Pulmonary effort is normal.     Breath sounds: No wheezing, rhonchi or rales.  Abdominal:     General: There is no distension.  Musculoskeletal:     Cervical back: Normal range of motion.     Comments: Arms and legs in restraints  Skin:    General: Skin is warm and dry.  Neurological:     Comments: Unable to assess     ED Results / Procedures / Treatments   Labs (all labs ordered are listed, but only abnormal results are displayed) Labs Reviewed  COMPREHENSIVE METABOLIC PANEL WITH GFR - Abnormal; Notable for the following components:      Result Value   Potassium 3.1 (*)    Chloride 97 (*)    Creatinine, Ser 1.04 (*)    Anion gap 16 (*)    All other components within normal limits  SALICYLATE LEVEL - Abnormal; Notable for the  following components:   Salicylate Lvl <7.0 (*)    All other components within normal limits  ACETAMINOPHEN  LEVEL - Abnormal; Notable for the following components:   Acetaminophen  (Tylenol ), Serum <10 (*)    All other components within normal limits  VALPROIC ACID  LEVEL - Abnormal; Notable for the following components:   Valproic Acid  Lvl <10 (*)    All other components within normal limits  CBC WITH DIFFERENTIAL/PLATELET - Abnormal; Notable for the following components:   Platelets 426 (*)    All other components within normal limits  CBG MONITORING, ED - Abnormal; Notable for the following components:   Glucose-Capillary 105 (*)    All other components within normal limits  ETHANOL  HCG, SERUM, QUALITATIVE  MAGNESIUM   AMMONIA  RAPID  URINE DRUG SCREEN, HOSP PERFORMED  CBC WITH DIFFERENTIAL/PLATELET  CBG MONITORING, ED   Results for orders placed or performed during the hospital encounter of 12/14/23  CBG monitoring, ED   Collection Time: 12/14/23  9:48 PM  Result Value Ref Range   Glucose-Capillary 105 (H) 70 - 99 mg/dL  Comprehensive metabolic panel   Collection Time: 12/14/23  9:50 PM  Result Value Ref Range   Sodium 138 135 - 145 mmol/L   Potassium 3.1 (L) 3.5 - 5.1 mmol/L   Chloride 97 (L) 98 - 111 mmol/L   CO2 25 22 - 32 mmol/L   Glucose, Bld 94 70 - 99 mg/dL   BUN 12 6 - 20 mg/dL   Creatinine, Ser 8.65 (H) 0.44 - 1.00 mg/dL   Calcium 8.9 8.9 - 78.4 mg/dL   Total Protein 8.1 6.5 - 8.1 g/dL   Albumin 4.8 3.5 - 5.0 g/dL   AST 27 15 - 41 U/L   ALT 14 0 - 44 U/L   Alkaline Phosphatase 48 38 - 126 U/L   Total Bilirubin 0.6 0.0 - 1.2 mg/dL   GFR, Estimated >69 >62 mL/min   Anion gap 16 (H) 5 - 15  Salicylate level   Collection Time: 12/14/23  9:50 PM  Result Value Ref Range   Salicylate Lvl <7.0 (L) 7.0 - 30.0 mg/dL  Acetaminophen  level   Collection Time: 12/14/23  9:50 PM  Result Value Ref Range   Acetaminophen  (Tylenol ), Serum <10 (L) 10 - 30 ug/mL  Ethanol   Collection Time: 12/14/23  9:50 PM  Result Value Ref Range   Alcohol, Ethyl (B) <15 <15 mg/dL  hCG, serum, qualitative   Collection Time: 12/14/23  9:50 PM  Result Value Ref Range   Preg, Serum NEGATIVE NEGATIVE  Magnesium    Collection Time: 12/14/23  9:50 PM  Result Value Ref Range   Magnesium  1.9 1.7 - 2.4 mg/dL  Valproic acid  level   Collection Time: 12/14/23  9:50 PM  Result Value Ref Range   Valproic Acid  Lvl <10 (L) 50 - 100 ug/mL  Ammonia   Collection Time: 12/14/23  9:58 PM  Result Value Ref Range   Ammonia 26 9 - 35 umol/L  CBC with Differential/Platelet   Collection Time: 12/14/23 10:11 PM  Result Value Ref Range   WBC 6.9 4.0 - 10.5 K/uL   RBC 5.10 3.87 - 5.11 MIL/uL   Hemoglobin 14.1 12.0 - 15.0 g/dL   HCT 95.2 84.1  - 32.4 %   MCV 85.3 80.0 - 100.0 fL   MCH 27.6 26.0 - 34.0 pg   MCHC 32.4 30.0 - 36.0 g/dL   RDW 40.1 02.7 - 25.3 %   Platelets 426 (H) 150 -  400 K/uL   nRBC 0.0 0.0 - 0.2 %   Neutrophils Relative % 61 %   Neutro Abs 4.2 1.7 - 7.7 K/uL   Lymphocytes Relative 26 %   Lymphs Abs 1.8 0.7 - 4.0 K/uL   Monocytes Relative 10 %   Monocytes Absolute 0.7 0.1 - 1.0 K/uL   Eosinophils Relative 2 %   Eosinophils Absolute 0.1 0.0 - 0.5 K/uL   Basophils Relative 1 %   Basophils Absolute 0.1 0.0 - 0.1 K/uL   Immature Granulocytes 0 %   Abs Immature Granulocytes 0.02 0.00 - 0.07 K/uL     EKG None  Radiology No results found.  Procedures .Critical Care  Performed by: Mandy Second, PA-C Authorized by: Mandy Second, PA-C   Critical care provider statement:    Critical care time (minutes):  35   Critical care was necessary to treat or prevent imminent or life-threatening deterioration of the following conditions:  Toxidrome   Critical care was time spent personally by me on the following activities:  Discussions with consultants, examination of patient, ordering and review of laboratory studies, pulse oximetry, re-evaluation of patient's condition and review of old charts     Medications Ordered in ED Medications - No data to display  ED Course/ Medical Decision Making/ A&P Clinical Course as of 12/14/23 2328  Wed Dec 14, 2023  2303 Recheck - still sedated. VSS.  [SU]  2328 Recheck - stable on monitor. Still sedated.  [SU]    Clinical Course User Index [SU] Mandy Second, PA-C                                 Medical Decision Making This patient presents to the ED for concern of suicide attempt, this involves an extensive number of treatment options, and is a complaint that carries with it a high risk of complications and morbidity.    Co morbidities that complicate the patient evaluation  Documented history of SI, attempt   Additional history obtained:  Additional  history and/or information obtained from chart review, notable for Last admission for SI 11/07/23   Lab Tests:  I Ordered, and personally interpreted labs.  The pertinent results include:  CBC WNL; Ammonia 26; Valproic Acid  <10, Magnesium  1.9, Pregnancy neg, Tylenol  level <10, Salicylate level <7; Na 138, K+ 3.1, Cl 97, Cr 1.04   Imaging Studies ordered:  I ordered imaging studies including n/a I independently visualized and interpreted imaging which showed n/a I agree with the radiologist interpretation   Cardiac Monitoring:  The patient was maintained on a cardiac monitor.  I personally viewed and interpreted the cardiac monitored which showed an underlying rhythm of: sinus, prolonged QT (history of same)    Test Considered:  Head CT - patient left suicide note, empty pill bottles on scene - doubt acute CVA or head injury   Critical Interventions:  Placed on monitor, serial exams   Consultations Obtained:  I requested consultation with the Poison Control,  and discussed lab and imaging findings as well as pertinent plan - they recommend:  EKG, Depakote  level, ammonia, SMP, magnesium  level, Cardiac Monitoring, inpatient monitoring x 24 hours   Problem List / ED Course:  BIB EMS and [employer] as overdose with note on scene, history of SI, last hospitalized 3/31 by psychiatry   Reevaluation:  After the interventions noted above, I reevaluated the patient and found that they have :stayed the same  Social Determinants of Health:  History psychiatric illness   Disposition:  After consideration of the diagnostic results and the patients response to treatment, I feel that the patient would benefit from admit.   Amount and/or Complexity of Data Reviewed Labs: ordered.           Final Clinical Impression(s) / ED Diagnoses Final diagnoses:  Suicide attempt [facility])    Rx / DC Orders ED Discharge Orders     None         Mandy Second, PA-C 12/14/23  2328    Sallyanne Creamer, DO 12/19/23 0710

## 2023-12-15 ENCOUNTER — Encounter (HOSPITAL_COMMUNITY): Payer: Self-pay | Admitting: Internal Medicine

## 2023-12-15 ENCOUNTER — Observation Stay (HOSPITAL_COMMUNITY): Payer: MEDICAID

## 2023-12-15 DIAGNOSIS — T50901A Poisoning by unspecified drugs, medicaments and biological substances, accidental (unintentional), initial encounter: Secondary | ICD-10-CM | POA: Diagnosis present

## 2023-12-15 DIAGNOSIS — R4182 Altered mental status, unspecified: Secondary | ICD-10-CM

## 2023-12-15 DIAGNOSIS — E876 Hypokalemia: Secondary | ICD-10-CM | POA: Insufficient documentation

## 2023-12-15 DIAGNOSIS — N179 Acute kidney failure, unspecified: Secondary | ICD-10-CM | POA: Diagnosis not present

## 2023-12-15 DIAGNOSIS — T1491XA Suicide attempt, initial encounter: Principal | ICD-10-CM | POA: Insufficient documentation

## 2023-12-15 DIAGNOSIS — R569 Unspecified convulsions: Secondary | ICD-10-CM | POA: Diagnosis not present

## 2023-12-15 DIAGNOSIS — T50902A Poisoning by unspecified drugs, medicaments and biological substances, intentional self-harm, initial encounter: Secondary | ICD-10-CM

## 2023-12-15 LAB — RAPID URINE DRUG SCREEN, HOSP PERFORMED
Amphetamines: NOT DETECTED
Barbiturates: NOT DETECTED
Benzodiazepines: POSITIVE — AB
Cocaine: NOT DETECTED
Opiates: NOT DETECTED
Tetrahydrocannabinol: POSITIVE — AB

## 2023-12-15 LAB — CBC WITH DIFFERENTIAL/PLATELET
Abs Immature Granulocytes: 0.04 10*3/uL (ref 0.00–0.07)
Basophils Absolute: 0 10*3/uL (ref 0.0–0.1)
Basophils Relative: 0 %
Eosinophils Absolute: 0 10*3/uL (ref 0.0–0.5)
Eosinophils Relative: 0 %
HCT: 42.4 % (ref 36.0–46.0)
Hemoglobin: 13.7 g/dL (ref 12.0–15.0)
Immature Granulocytes: 0 %
Lymphocytes Relative: 17 %
Lymphs Abs: 1.9 10*3/uL (ref 0.7–4.0)
MCH: 28 pg (ref 26.0–34.0)
MCHC: 32.3 g/dL (ref 30.0–36.0)
MCV: 86.7 fL (ref 80.0–100.0)
Monocytes Absolute: 0.6 10*3/uL (ref 0.1–1.0)
Monocytes Relative: 6 %
Neutro Abs: 8.6 10*3/uL — ABNORMAL HIGH (ref 1.7–7.7)
Neutrophils Relative %: 77 %
Platelets: 427 10*3/uL — ABNORMAL HIGH (ref 150–400)
RBC: 4.89 MIL/uL (ref 3.87–5.11)
RDW: 14.6 % (ref 11.5–15.5)
WBC: 11.1 10*3/uL — ABNORMAL HIGH (ref 4.0–10.5)
nRBC: 0.2 % (ref 0.0–0.2)

## 2023-12-15 LAB — BASIC METABOLIC PANEL WITH GFR
Anion gap: 14 (ref 5–15)
BUN: 13 mg/dL (ref 6–20)
CO2: 25 mmol/L (ref 22–32)
Calcium: 9.4 mg/dL (ref 8.9–10.3)
Chloride: 98 mmol/L (ref 98–111)
Creatinine, Ser: 1.04 mg/dL — ABNORMAL HIGH (ref 0.44–1.00)
GFR, Estimated: >60 mL/min (ref >60)
Glucose, Bld: 105 mg/dL — ABNORMAL HIGH (ref 70–99)
Potassium: 3.8 mmol/L (ref 3.5–5.1)
Sodium: 137 mmol/L (ref 135–145)

## 2023-12-15 LAB — CARBAMAZEPINE LEVEL, TOTAL: Carbamazepine Lvl: <2 ug/mL — ABNORMAL LOW (ref 4.0–12.0)

## 2023-12-15 LAB — ACETAMINOPHEN LEVEL: Acetaminophen (Tylenol), Serum: <10 ug/mL — ABNORMAL LOW (ref 10–30)

## 2023-12-15 LAB — HEPATIC FUNCTION PANEL
ALT: 15 U/L (ref 0–44)
AST: 27 U/L (ref 15–41)
Albumin: 4.9 g/dL (ref 3.5–5.0)
Alkaline Phosphatase: 45 U/L (ref 38–126)
Bilirubin, Direct: 0.2 mg/dL (ref 0.0–0.2)
Indirect Bilirubin: 0.6 mg/dL (ref 0.3–0.9)
Total Bilirubin: 0.8 mg/dL (ref 0.0–1.2)
Total Protein: 8.3 g/dL — ABNORMAL HIGH (ref 6.5–8.1)

## 2023-12-15 LAB — SALICYLATE LEVEL: Salicylate Lvl: <7 mg/dL — ABNORMAL LOW (ref 7.0–30.0)

## 2023-12-15 MED ORDER — PROCHLORPERAZINE MALEATE 5 MG PO TABS: 5.0000 mg | ORAL_TABLET | Freq: Four times a day (QID) | ORAL | Status: DC | PRN

## 2023-12-15 MED ORDER — MAGNESIUM SULFATE 2 GM/50ML IV SOLN
2.0000 g | Freq: Once | INTRAVENOUS | Status: AC
  Administered 2023-12-15: 2 g via INTRAVENOUS
  Filled 2023-12-15: qty 50

## 2023-12-15 MED ORDER — LACTATED RINGERS IV SOLN: INTRAVENOUS | Status: DC

## 2023-12-15 MED ORDER — ACETAMINOPHEN 325 MG PO TABS
650.0000 mg | ORAL_TABLET | Freq: Four times a day (QID) | ORAL | Status: DC | PRN
  Administered 2023-12-15: 650 mg via ORAL
  Filled 2023-12-15: qty 2

## 2023-12-15 MED ORDER — LORAZEPAM 2 MG/ML IJ SOLN
2.0000 mg | Freq: Three times a day (TID) | INTRAMUSCULAR | Status: DC | PRN
  Administered 2023-12-16 – 2023-12-18 (×3): 2 mg via INTRAVENOUS
  Filled 2023-12-15 (×4): qty 1

## 2023-12-15 MED ORDER — POTASSIUM CHLORIDE 10 MEQ/100ML IV SOLN
10.0000 meq | INTRAVENOUS | Status: AC
  Administered 2023-12-15 (×2): 10 meq via INTRAVENOUS
  Filled 2023-12-15 (×2): qty 100

## 2023-12-15 NOTE — ED Provider Notes (Signed)
  Physical Exam  BP 97/70   Pulse 72   Temp 97.8 F (36.6 C) (Temporal)   Resp 13   SpO2 100%   Physical Exam  Procedures  Procedures  ED Course / MDM   Clinical Course as of 12/15/23 0024  Wed Dec 14, 2023  2303 Recheck - still sedated. VSS.  [SU]  2328 Recheck - stable on monitor. Still sedated.  [SU]    Clinical Course User Index [SU] Mandy Second, PA-C   Medical Decision Making Amount and/or Complexity of Data Reviewed Labs: ordered.  Risk Decision regarding hospitalization.   Patient care assumed at shift handoff with plans for admission to medicine team.  Patient with apparent suicide attempt after ingestion of unknown substance.  Suicide note found in journal if patient side.  Patient became combative with EMS and was treated with 400 of ketamine, 5 of Haldol , and 5 of Versed .  Patient currently sedated.  Poison control recommends 24-hour observation period.  Patient will need psychiatric evaluation after medically clear.  Discussed the case with the hospitalist, Dr. [facility], who agrees to see the patient for admission       Delories Fetter 12/15/23 Bella Bowman, MD 12/15/23 [phone]

## 2023-12-15 NOTE — ED Notes (Addendum)
 Spoke with poisoncontrol and they recommend increasing the potassium level in body as well as magnesium  level in body. Afterwards we will need to obtain another EKG. Poison control also reccomends checking a carbamazepine level. MD [facility] advised.

## 2023-12-15 NOTE — Plan of Care (Signed)
 PLAN OF CARE NOTE  28 y.o. female with history of bipolar disorder/depression with multiple prior suicide attempts was brought to the ER after patient was found to be unresponsive.  As per the report patient called EMS and on EMS arrival patient was found to be unresponsive with empty bottles of Abilify  around her.  There was also report of possible carbamazepine bottles.  There was also a suicide note.  Later patient became more agitated and was given Versed  Haldol  and ketamine.  There was some concern for brief episode of seizure.  Patient also threw up once.   In the ED, patient was noted to be lethargic.  EKG showed normal sinus rhythm with prolonged QTc.  Acetaminophen  level, valproate level, salicylate levels were negative.  Patient remained lethargic. UDS positive for benzos and THC.   Patient underwent EEG which was normal.  No seizures or epileptiform discharges noted.  - Repeat EKG - Continue supportive care.   MDALA-GAUSI, Sharon Ward  12/15/23 2:52 PM

## 2023-12-15 NOTE — Progress Notes (Signed)
 Received call from [facility] @ poison control stating case would be closed.

## 2023-12-15 NOTE — Progress Notes (Signed)
 EEG complete - results pending

## 2023-12-15 NOTE — ED Notes (Signed)
 Called CCMD to place patient on monitor

## 2023-12-15 NOTE — Procedures (Signed)
 [name]  MRN: [id number]  Epilepsy Attending: Arleene Lack  Referring Physician/Provider: Angelene Kelly, MD  Date: 12/15/2023   Duration: 25.28 mins  Patient history: 28yo F with ams. EEG to evaluate for seizure  Level of alertness: Awake, asleep  AEDs during EEG study: None  Technical aspects: This EEG study was done with scalp electrodes positioned according to the 10-20 International system of electrode placement. Electrical activity was reviewed with band pass filter of 1-70Hz , sensitivity of 7 uV/mm, display speed of 44mm/sec with a 60Hz  notched filter applied as appropriate. EEG data were recorded continuously and digitally stored.  Video monitoring was available and reviewed as appropriate.  Description: The posterior dominant rhythm consists of 9 Hz activity of moderate voltage (25-35 uV) seen predominantly in posterior head regions, symmetric and reactive to eye opening and eye closing. Sleep was characterized by vertex waves, sleep spindles (12 to 14 Hz), maximal frontocentral region.  Hyperventilation and photic stimulation were not performed.     IMPRESSION: This study is within normal limits. No seizures or epileptiform discharges were seen throughout the recording.  A normal interictal EEG does not exclude the diagnosis of epilepsy.  Samay Delcarlo O Tashera Montalvo

## 2023-12-15 NOTE — Progress Notes (Signed)
 Pt became agitated, and began screaming and banging head against the bed. Emotional support given by staff and patient calmed down without use of prns. Sitter at bedside, suicide precautions maintained. Pt belongings at nurses station. Pt in bed, eyes closed and quiet at this time.

## 2023-12-15 NOTE — Consult Note (Signed)
 Patient is well-known to the [facility]. She presents today following a repeat suicide attempt via unknown ingestion. Attempts were made to complete a psychiatric evaluation; however, the patient remained significantly lethargic, bordering on obtunded, and was unable to meaningfully participate in assessment at this time.  The psychiatric consult service will continue to monitor and reassess. Given the severity of presentation and her history, the patient will likely require inpatient psychiatric admission once medically cleared.  Past psychiatric history is notable for bipolar disorder. Her most recent documented inpatient psychiatric admission occurred on July 06, 2023, following an involuntary commitment (IVC) for suicidal threats involving intent to jump from a bridge.  The patient has a known history of agitation, irritability, and manipulation behaviors. Initiation of an agitation protocol is recommended to promote safety for both the patient and staff.  Additional information: EEG obtained was normal. Poison Control has not yet provided clearance or final recommendations regarding the ingestion. Medical stabilization remains a priority at this time.

## 2023-12-15 NOTE — H&P (Addendum)
 History and Physical    Sharon Ward ZOX:[id number] DOB: [birth year] DOA: 12/14/2023  Patient coming from: Home.  Chief Complaint: Drug overdose.  HPI: Sharon Ward is a 28 y.o. female with history of bipolar disorder/depression with multiple prior suicide attempts was brought to the ER after patient was found to be unresponsive.  As per the report patient called EMS and on EMS arrival patient was found to be unresponsive with empty bottles of Abilify  around her.  There was also report of possible carbamazepine bottles.  There was also a suicide note.  Later patient became more agitated and was given Versed  Haldol  and ketamine.  There was some concern for brief episode of seizure.  Patient also threw up once.   ED Course: In the ER patient is lethargic.  Able to protect her airway.  Labs show potassium of 3.1 bicarb of 25 anion gap of 16 creatinine of 1 ammonia 26 EKG shows normal sinus rhythm with QTc of 508 ms.  Acetaminophen  level valproate level and salicylate levels were negative as requested by poison control.  Patient is still lethargic and admitted for further observation.  Review of Systems: As per HPI, rest all negative.   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 Her smoking use included cigarettes. She started smoking about 17 years ago. She has a 15 pack-year smoking history. She has never used smokeless tobacco. She reports that she does not currently use alcohol. She reports current drug use. Frequency: 4.00 times per week. Drugs: Marijuana and Other-see comments.  Allergies  Allergen Reactions   Ascorbate Rash   Ascorbic Acid Hives and Rash   Citrus Rash   Coconut Flavoring Agent (Non-Screening) Rash   Lamotrigine Rash and Hives    Other Reaction(s): Other (See Comments)  Unknown by patient   Latex Rash and Other (See Comments)    Skin turns red and breaks out where placed   Orange (Diagnostic) Rash   Peach Flavoring Agent (Non-Screening) Rash   Pear Rash   Pineapple Rash   Prunus Persica Hives and Rash   Tape Other (See Comments) and Rash    Skin turns red and breaks out where placed    Family History  Problem Relation Age of Onset   Asthma Mother    Diabetes Mother    Healthy Mother    Hypertension Father    Asthma Father    Diabetes Father    Healthy Father    Asthma  Brother    Hypertension Paternal Uncle    Diabetes Paternal Grandmother    Stroke Paternal Grandfather    Heart disease Paternal Grandfather    Hypertension Paternal Grandfather    Diabetes Paternal Grandfather     Prior to Admission medications   Medication Sig Start Date End Date Taking? Authorizing Provider  benztropine  (COGENTIN ) 1 MG tablet Take 1 tablet (1 mg total) by mouth 2 (two) times daily. 11/07/23 12/07/23  Onuoha, Josephine C, NP  clonazePAM  (KLONOPIN ) 0.5 MG tablet Take 1 tablet (0.5 mg total) by mouth 2 (two) times daily. 11/07/23    Onuoha, Josephine C, NP  diphenhydrAMINE  (BENADRYL ) 50 MG capsule Take 1 capsule (50 mg total) by mouth every 6 (six) hours as needed (agitation). 11/07/23   Onuoha, Josephine C, NP  divalproex  (DEPAKOTE ) 500 MG DR tablet Take 1 tablet (500 mg total) by mouth 2 (two) times daily. 11/07/23 12/07/23  Onuoha, Josephine C, NP  paliperidone  (INVEGA ) 6 MG 24 hr tablet Take 1 tablet (6 mg total) by mouth daily. 11/08/23 12/08/23  Alfreida Inches, NP    Physical Exam: Constitutional: Moderately built and nourished. Vitals:   12/15/23 0045 12/15/23 0100 12/15/23 0116 12/15/23 0200  BP: 99/66 (!) 92/52 (!) 99/59 95/63  Pulse: 61 (!) 56 61 60  Resp: 15 13 15 12   Temp:   97.8 F (36.6 C)   TempSrc:   Temporal   SpO2: 100% 100%  100%   Eyes: Anicteric no pallor. ENMT: No discharge from the ears eyes nose and mouth. Neck: No mass felt.  No neck rigidity. Respiratory: No rhonchi or crepitations. Cardiovascular: S1-S2 heard. Abdomen: Soft nontender bowel sounds present. Musculoskeletal: No edema. Skin: No rash. Neurologic: Lethargic moves all extremities.  Pupils reacting to light. Psychiatric: Patient is lethargic.   Labs on Admission: I have personally reviewed following labs and imaging studies  CBC: Recent Labs  Lab 12/14/23 2211  WBC 6.9  NEUTROABS 4.2  HGB 14.1  HCT 43.5  MCV 85.3  PLT 426*   Basic Metabolic Panel: Recent Labs  Lab 12/14/23 2150  NA 138  K 3.1*  CL 97*  CO2 25  GLUCOSE 94  BUN 12  CREATININE 1.04*  CALCIUM 8.9  MG 1.9   GFR: Estimated Creatinine Clearance: 76.6 mL/min (A) (by C-G formula based on SCr of 1.04 mg/dL (H)). Liver Function Tests: Recent Labs  Lab 12/14/23 2150  AST 27  ALT 14  ALKPHOS 48  BILITOT 0.6  PROT 8.1  ALBUMIN 4.8   No results for input(s): "LIPASE", "AMYLASE" in the last 168 hours. Recent Labs  Lab 12/14/23 2158  AMMONIA 26   Coagulation Profile: No results for input(s): "INR", "PROTIME" in the last 168  hours. Cardiac Enzymes: No results for input(s): "CKTOTAL", "CKMB", "CKMBINDEX", "TROPONINI" in the last 168 hours. BNP (last 3 results) No results for input(s): "PROBNP" in the last 8760 hours. HbA1C: No results for input(s): "HGBA1C" in the last 72 hours. CBG: Recent Labs  Lab 12/14/23 2148  GLUCAP 105*   Lipid Profile: No results for input(s): "CHOL", "HDL", "LDLCALC", "TRIG", "CHOLHDL", "LDLDIRECT" in the last 72 hours. Thyroid  Function Tests: No results for input(s): "TSH", "T4TOTAL", "FREET4", "T3FREE", "THYROIDAB" in the last 72 hours. Anemia Panel: No results for input(s): "VITAMINB12", "FOLATE", "FERRITIN", "TIBC", "IRON", "RETICCTPCT" in the last 72 hours. Urine analysis:    Component Value Date/Time   COLORURINE YELLOW 06/19/2023 0836   APPEARANCEUR CLOUDY (A) 06/19/2023 0836   LABSPEC 1.018 06/19/2023 5621  PHURINE 6.0 06/19/2023 0836   GLUCOSEU NEGATIVE 06/19/2023 0836   HGBUR SMALL (A) 06/19/2023 0836   BILIRUBINUR NEGATIVE 06/19/2023 0836   KETONESUR NEGATIVE 06/19/2023 0836   PROTEINUR NEGATIVE 06/19/2023 0836   UROBILINOGEN 0.2 01/25/2021 1419   NITRITE NEGATIVE 06/19/2023 0836   LEUKOCYTESUR LARGE (A) 06/19/2023 0836   Sepsis Labs: @LABRCNTIP (procalcitonin:4,lacticidven:4) )No results found for this or any previous visit (from the past 240 hours).   Radiological Exams on Admission: No results found.  EKG: Independently reviewed.  Normal sinus rhythm QTc of 5 8 ms.  Assessment/Plan Principal Problem:   Drug overdose Active Problems:   Suicide attempt [facility])    Intentional drug overdose with suicide attempt with history of bipolar disorder/depression -      patient is lethargic and not sure what drug patient had overdosed with but empty bottles of Abilify  was found at the bedside.  At this time Poison control has recommended observation for 24 hours.  Poison control also requested acetaminophen  levels valproate levels ammonia and salicylate level  which were negative.  Poison control also requested carbamazepine level which is pending.  EKG showed a QTc of 5 8 ms.  In sinus rhythm.  Will keep patient on suicide precautions and consult psychiatry.  At this time patient is lethargic but protecting airway.  Urine drug screen is pending.  Patient is lethargic but on verbal stimuli she is waking up and I do not think she is having active seizures.  I did discuss with Dr. Romilda Coaster neurologist who at this time feels a seizure may have been provoked seizure and does not need to be on antiepileptics but to get a routine EEG. Acute renal failure with creatinine mildly increased from baseline.  Could be from patient having some vomiting.  Gently hydrate follow metabolic panel. Hypokalemia replace and recheck. Prolonged QTc Poison control recommended keeping potassium more than 4.2 and magnesium  more than 2.2.  I have ordered replacement for potassium and magnesium .  Repeat EKG after correction.  Avoid QT prolonging medications.  Since patient has possibly overdosed with multiple drugs will need close monitoring and more than 2 midnight stay.   DVT prophylaxis: SCDs. Code Status: Full code. Family Communication: Unable to reach numbers provided to contact. Disposition Plan: Progressive care. Consults called: Psychiatry. Admission status: Observation.

## 2023-12-15 NOTE — Plan of Care (Signed)
  Problem: Safety: Goal: Violent Restraint(s) Outcome: Progressing   Problem: Education: Goal: Knowledge of General Education information will improve Description: Including pain rating scale, medication(s)/side effects and non-pharmacologic comfort measures Outcome: Progressing   Problem: Health Behavior/Discharge Planning: Goal: Ability to manage health-related needs will improve Outcome: Progressing   Problem: Clinical Measurements: Goal: Ability to maintain clinical measurements within normal limits will improve Outcome: Progressing Goal: Will remain free from infection Outcome: Progressing Goal: Diagnostic test results will improve Outcome: Progressing Goal: Respiratory complications will improve Outcome: Progressing Goal: Cardiovascular complication will be avoided Outcome: Progressing   Problem: Activity: Goal: Risk for activity intolerance will decrease Outcome: Progressing   Problem: Nutrition: Goal: Adequate nutrition will be maintained Outcome: Progressing   Problem: Coping: Goal: Level of anxiety will decrease Outcome: Progressing   Problem: Elimination: Goal: Will not experience complications related to bowel motility Outcome: Progressing Goal: Will not experience complications related to urinary retention Outcome: Progressing   Problem: Pain Managment: Goal: General experience of comfort will improve and/or be controlled Outcome: Progressing   Problem: Safety: Goal: Ability to remain free from injury will improve Outcome: Progressing   Problem: Skin Integrity: Goal: Risk for impaired skin integrity will decrease Outcome: Progressing

## 2023-12-16 DIAGNOSIS — F4310 Post-traumatic stress disorder, unspecified: Secondary | ICD-10-CM | POA: Diagnosis present

## 2023-12-16 DIAGNOSIS — Z888 Allergy status to other drugs, medicaments and biological substances status: Secondary | ICD-10-CM | POA: Diagnosis not present

## 2023-12-16 DIAGNOSIS — F314 Bipolar disorder, current episode depressed, severe, without psychotic features: Secondary | ICD-10-CM | POA: Diagnosis present

## 2023-12-16 DIAGNOSIS — F411 Generalized anxiety disorder: Secondary | ICD-10-CM | POA: Diagnosis present

## 2023-12-16 DIAGNOSIS — T43592A Poisoning by other antipsychotics and neuroleptics, intentional self-harm, initial encounter: Secondary | ICD-10-CM | POA: Diagnosis present

## 2023-12-16 DIAGNOSIS — Z91048 Other nonmedicinal substance allergy status: Secondary | ICD-10-CM | POA: Diagnosis not present

## 2023-12-16 DIAGNOSIS — Z9102 Food additives allergy status: Secondary | ICD-10-CM | POA: Diagnosis not present

## 2023-12-16 DIAGNOSIS — Z79899 Other long term (current) drug therapy: Secondary | ICD-10-CM | POA: Diagnosis not present

## 2023-12-16 DIAGNOSIS — Z833 Family history of diabetes mellitus: Secondary | ICD-10-CM | POA: Diagnosis not present

## 2023-12-16 DIAGNOSIS — E876 Hypokalemia: Secondary | ICD-10-CM | POA: Diagnosis present

## 2023-12-16 DIAGNOSIS — I959 Hypotension, unspecified: Secondary | ICD-10-CM | POA: Diagnosis not present

## 2023-12-16 DIAGNOSIS — N179 Acute kidney failure, unspecified: Secondary | ICD-10-CM | POA: Diagnosis present

## 2023-12-16 DIAGNOSIS — T50902A Poisoning by unspecified drugs, medicaments and biological substances, intentional self-harm, initial encounter: Secondary | ICD-10-CM | POA: Diagnosis not present

## 2023-12-16 DIAGNOSIS — Z91040 Latex allergy status: Secondary | ICD-10-CM | POA: Diagnosis not present

## 2023-12-16 DIAGNOSIS — Z87891 Personal history of nicotine dependence: Secondary | ICD-10-CM | POA: Diagnosis not present

## 2023-12-16 DIAGNOSIS — E860 Dehydration: Secondary | ICD-10-CM | POA: Diagnosis present

## 2023-12-16 DIAGNOSIS — T50901A Poisoning by unspecified drugs, medicaments and biological substances, accidental (unintentional), initial encounter: Secondary | ICD-10-CM | POA: Diagnosis present

## 2023-12-16 DIAGNOSIS — Z8249 Family history of ischemic heart disease and other diseases of the circulatory system: Secondary | ICD-10-CM | POA: Diagnosis not present

## 2023-12-16 DIAGNOSIS — Z91018 Allergy to other foods: Secondary | ICD-10-CM | POA: Diagnosis not present

## 2023-12-16 DIAGNOSIS — Z9151 Personal history of suicidal behavior: Secondary | ICD-10-CM | POA: Diagnosis not present

## 2023-12-16 DIAGNOSIS — F603 Borderline personality disorder: Secondary | ICD-10-CM | POA: Diagnosis present

## 2023-12-16 DIAGNOSIS — Z555 Less than a high school diploma: Secondary | ICD-10-CM | POA: Diagnosis not present

## 2023-12-16 LAB — COMPREHENSIVE METABOLIC PANEL WITH GFR
ALT: 14 U/L (ref 0–44)
AST: 20 U/L (ref 15–41)
Albumin: 3.9 g/dL (ref 3.5–5.0)
Alkaline Phosphatase: 40 U/L (ref 38–126)
Anion gap: 11 (ref 5–15)
BUN: 15 mg/dL (ref 6–20)
CO2: 29 mmol/L (ref 22–32)
Calcium: 9.3 mg/dL (ref 8.9–10.3)
Chloride: 102 mmol/L (ref 98–111)
Creatinine, Ser: 0.89 mg/dL (ref 0.44–1.00)
GFR, Estimated: >60 mL/min (ref >60)
Glucose, Bld: 85 mg/dL (ref 70–99)
Potassium: 3.7 mmol/L (ref 3.5–5.1)
Sodium: 142 mmol/L (ref 135–145)
Total Bilirubin: 1 mg/dL (ref 0.0–1.2)
Total Protein: 6.6 g/dL (ref 6.5–8.1)

## 2023-12-16 LAB — CBC WITH DIFFERENTIAL/PLATELET
Abs Immature Granulocytes: 0.01 10*3/uL (ref 0.00–0.07)
Basophils Absolute: 0.1 10*3/uL (ref 0.0–0.1)
Basophils Relative: 1 %
Eosinophils Absolute: 0.1 10*3/uL (ref 0.0–0.5)
Eosinophils Relative: 2 %
HCT: 38.5 % (ref 36.0–46.0)
Hemoglobin: 12.5 g/dL (ref 12.0–15.0)
Immature Granulocytes: 0 %
Lymphocytes Relative: 37 %
Lymphs Abs: 1.8 10*3/uL (ref 0.7–4.0)
MCH: 28 pg (ref 26.0–34.0)
MCHC: 32.5 g/dL (ref 30.0–36.0)
MCV: 86.1 fL (ref 80.0–100.0)
Monocytes Absolute: 0.4 10*3/uL (ref 0.1–1.0)
Monocytes Relative: 8 %
Neutro Abs: 2.5 10*3/uL (ref 1.7–7.7)
Neutrophils Relative %: 52 %
Platelets: 314 10*3/uL (ref 150–400)
RBC: 4.47 MIL/uL (ref 3.87–5.11)
RDW: 14.7 % (ref 11.5–15.5)
WBC: 4.9 10*3/uL (ref 4.0–10.5)
nRBC: 0 % (ref 0.0–0.2)

## 2023-12-16 LAB — MAGNESIUM: Magnesium: 2.1 mg/dL (ref 1.7–2.4)

## 2023-12-16 LAB — PHOSPHORUS: Phosphorus: 3.1 mg/dL (ref 2.5–4.6)

## 2023-12-16 MED ORDER — LACTATED RINGERS IV SOLN: INTRAVENOUS | Status: AC

## 2023-12-16 MED ORDER — CLONAZEPAM 0.5 MG PO TABS
0.5000 mg | ORAL_TABLET | Freq: Two times a day (BID) | ORAL | Status: DC
  Administered 2023-12-16: 0.5 mg via ORAL
  Filled 2023-12-16: qty 1

## 2023-12-16 MED ORDER — CARIPRAZINE HCL 1.5 MG PO CAPS
1.5000 mg | ORAL_CAPSULE | Freq: Every day | ORAL | Status: DC
  Administered 2023-12-16 – 2023-12-17 (×2): 1.5 mg via ORAL
  Filled 2023-12-16 (×2): qty 1

## 2023-12-16 MED ORDER — LORAZEPAM 1 MG PO TABS
1.0000 mg | ORAL_TABLET | Freq: Two times a day (BID) | ORAL | Status: DC
  Administered 2023-12-16 – 2023-12-17 (×3): 1 mg via ORAL
  Filled 2023-12-16 (×4): qty 1

## 2023-12-16 MED ORDER — HALOPERIDOL LACTATE 5 MG/ML IJ SOLN
2.0000 mg | Freq: Four times a day (QID) | INTRAMUSCULAR | Status: DC | PRN
  Administered 2023-12-16 – 2023-12-17 (×2): 2 mg via INTRAMUSCULAR
  Filled 2023-12-16 (×5): qty 1

## 2023-12-16 MED ORDER — LACTATED RINGERS IV SOLN: INTRAVENOUS | Status: DC

## 2023-12-16 NOTE — Plan of Care (Signed)
  Problem: Safety: Goal: Violent Restraint(s) Outcome: Progressing   Problem: Education: Goal: Knowledge of General Education information will improve Description: Including pain rating scale, medication(s)/side effects and non-pharmacologic comfort measures Outcome: Progressing   Problem: Health Behavior/Discharge Planning: Goal: Ability to manage health-related needs will improve Outcome: Progressing   Problem: Clinical Measurements: Goal: Ability to maintain clinical measurements within normal limits will improve Outcome: Progressing Goal: Will remain free from infection Outcome: Progressing Goal: Diagnostic test results will improve Outcome: Progressing Goal: Respiratory complications will improve Outcome: Progressing Goal: Cardiovascular complication will be avoided Outcome: Progressing   Problem: Activity: Goal: Risk for activity intolerance will decrease Outcome: Progressing   Problem: Nutrition: Goal: Adequate nutrition will be maintained Outcome: Progressing   Problem: Coping: Goal: Level of anxiety will decrease Outcome: Progressing   Problem: Elimination: Goal: Will not experience complications related to bowel motility Outcome: Progressing Goal: Will not experience complications related to urinary retention Outcome: Progressing   Problem: Pain Managment: Goal: General experience of comfort will improve and/or be controlled Outcome: Progressing   Problem: Safety: Goal: Ability to remain free from injury will improve Outcome: Progressing   Problem: Skin Integrity: Goal: Risk for impaired skin integrity will decrease Outcome: Progressing   Problem: Education: Goal: Knowledge of warning signs, risks, and behaviors that relate to suicide ideation and self-harm behaviors will improve Outcome: Progressing   Problem: Health Behavior/Discharge (Transition) Planning: Goal: Ability to manage health-related needs will improve Outcome: Progressing    Problem: Clinical Measurements: Goal: Remain free from any harm during hospitalization Outcome: Progressing   Problem: Nutrition: Goal: Adequate fluids and nutrition will be maintained Outcome: Progressing   Problem: Coping: Goal: Ability to disclose and discuss thoughts of suicide and self-harm will improve Outcome: Progressing   Problem: Medication Management: Goal: Adhere to prescribed medication regimen Outcome: Progressing   Problem: Sleep Hygiene: Goal: Ability to obtain adequate restful sleep will improve Outcome: Progressing   Problem: Self Esteem: Goal: Ability to verbalize positive feeling about self will improve Outcome: Progressing

## 2023-12-16 NOTE — TOC Initial Note (Signed)
 Transition of Care [facility]) - Initial/Assessment Note    Patient Details  Name: Sharon Ward MRN: [id number] Date of Birth: [birth year]  Transition of [facility]) CM/SW Contact:    Jannice Mends, LCSW Phone Number: 12/16/2023, 8:43 AM  Clinical Narrative:                 Patient admitted with suicide attempt. CSW continuing to follow for medical stability for inpatient psych referrals. Patient is IVC'd but no case # on file.   Expected Discharge Plan: Psychiatric Hospital Barriers to Discharge: Continued Medical Work up   Patient Goals and CMS Choice            Expected Discharge Plan and Services In-house Referral: Clinical Social Work                                            Prior Living Arrangements/Services     Patient language and need for interpreter reviewed:: Yes        Need for Family Participation in Patient Care: Yes (Comment) Care giver support system in place?: No (comment)   Criminal Activity/Legal Involvement Pertinent to Current Situation/Hospitalization: No - Comment as needed  Activities of Daily Living      Permission Sought/Granted                  Emotional Assessment Appearance:: Appears stated age     Orientation: : Oriented to Self, Oriented to Place   Psych Involvement: Yes (comment)  Admission diagnosis:  Drug overdose [T50.901A] Suicide attempt [facility]) [T14.91XA] Patient Active Problem List   Diagnosis Date Noted   Drug overdose 12/15/2023   Suicide attempt (HCC) 12/15/2023   ARF (acute renal failure) (HCC) 12/15/2023   Hypokalemia 12/15/2023   Laceration of finger of right hand without damage to nail 10/31/2023   Injury of flexor tendon of right hand 10/31/2023   Nicotine  use disorder 10/20/2023   Nonadherence to medication 10/20/2023   Cannabis abuse 05/05/2021   History of suicide attempts 11/02/2020   Bipolar I disorder, most recent episode depressed (HCC) 10/25/2020   Asthma 09/18/2019   Suicidal  ideation 09/18/2019   Borderline personality disorder (HCC) 10/24/2016   Chronic post-traumatic stress disorder (PTSD) 10/24/2016   PCP:  Roslyn Coombe, MD Pharmacy:   [id number] [address] [place]), [place] [address] [place] [address] Phone: [phone] Fax: [phone]     Social Drivers of Health (SDOH) Social History: SDOH Screenings   Food Insecurity: No Food Insecurity (07/05/2023)  Housing: Low Risk  (07/05/2023)  Transportation Needs: No Transportation Needs (07/05/2023)  Utilities: Not At Risk (07/05/2023)  Alcohol Screen: Low Risk  (07/05/2023)  Depression (PHQ2-9): Medium Risk (10/17/2023)  Tobacco Use: Medium Risk (12/15/2023)   SDOH Interventions:     Readmission Risk Interventions    04/06/2023    6:51 PM  Readmission Risk Prevention Plan  Transportation Screening Complete  Medication Review (RN Care Manager) Complete  PCP or Specialist appointment within 3-5 days of discharge Complete  HRI or Home Care Consult Complete  SW Recovery Care/Counseling Consult Complete  Palliative Care Screening Not Applicable  Skilled Nursing Facility Not Applicable

## 2023-12-16 NOTE — Progress Notes (Addendum)
 PROGRESS NOTE                                                                                                                                                                                                             Patient Demographics:    Sharon Ward, is a 28 y.o. female, DOB - 1996-01-05, ZOX:[id number]  Outpatient Primary MD for the patient is Roslyn Coombe, MD    LOS - 0  Admit date - 12/14/2023    Chief Complaint  Patient presents with   Drug Overdose       Brief Narrative (HPI from H&P)    28 y.o. female with history of bipolar disorder/depression with multiple prior suicide attempts was brought to the ER after patient was found to be unresponsive.  As per the report patient called EMS and on EMS arrival patient was found to be unresponsive with empty bottles of Abilify  around her.  There was also report of possible carbamazepine  bottles.  There was also a suicide note.  Later patient became more agitated and was given Versed  Haldol  and ketamine.  There was some concern for brief episode of seizure.  Patient also threw up once.    Subjective:    Sharon Ward today has, No headache, No chest pain, No abdominal pain - No Nausea, No new weakness tingling or numbness, no shortness of breath, still quite agitated and feels horrible.   Assessment  & Plan :   Intentional drug overdose with suicide attempt with history of bipolar disorder/depression -      patient is lethargic and not sure what drug patient had overdosed with but empty bottles of Abilify  was found at the bedside.  Patient control was consulted, monitor EKG and telemetry, monitor electrolytes, other toxicology results were stable, will be kept on IV fluids, suicide precautions psych to see, once medically stable [facility]. Acute renal failure - from dehydration, hydrate and monitor. Hypokalemia replace and recheck.  Electrolytes pending. Prolonged QTc - K  replaced, EKG subsequently on 12/15/2023 stable. Dehydration and hypotension.  Hydrate with IV fluids and monitor.        Condition - Fair  Family Communication  :     Code Status :  Full  Consults  :  Psych  PUD Prophylaxis :     Procedures  :  EEG - Stable      Disposition Plan  :    Status is: Observation   DVT Prophylaxis  :    SCDs Start: 12/15/23 0251    Lab Results  Component Value Date   PLT 427 (H) 12/15/2023    Diet :  Diet Order     None        Inpatient Medications  Scheduled Meds:  cariprazine   1.5 mg Oral QHS   LORazepam   1 mg Oral BID   Continuous Infusions:  lactated ringers      PRN Meds:.acetaminophen , haloperidol  lactate, LORazepam , prochlorperazine     Objective:   Vitals:   12/15/23 1400 12/15/23 1600 12/15/23 2007 12/16/23 0006  BP: 101/63 (!) 101/55 (!) 93/53 (!) 96/53  Pulse: (!) 44 (!) 51 (!) 50 (!) 49  Resp: 14 17 13 13   Temp:      TempSrc:      SpO2: 100% 98% 95% 96%    Wt Readings from Last 3 Encounters:  12/07/23 69.4 kg  10/17/23 77 kg  08/26/23 76.7 kg     Intake/Output Summary (Last 24 hours) at 12/16/2023 1048 Last data filed at 12/15/2023 1300 Gross per 24 hour  Intake 0 ml  Output --  Net 0 ml     Physical Exam  Awake Alert, No new F.N deficits, agitated affect [place].AT,PERRAL Supple Neck, No JVD,   Symmetrical Chest wall movement, Good air movement bilaterally, CTAB RRR,No Gallops,Rubs or new Murmurs,  +ve B.Sounds, Abd Soft, No tenderness,   No Cyanosis, Clubbing or edema       Data Review:    Recent Labs  Lab 12/14/23 2211 12/15/23 0252  WBC 6.9 11.1*  HGB 14.1 13.7  HCT 43.5 42.4  PLT 426* 427*  MCV 85.3 86.7  MCH 27.6 28.0  MCHC 32.4 32.3  RDW 14.6 14.6  LYMPHSABS 1.8 1.9  MONOABS 0.7 0.6  EOSABS 0.1 0.0  BASOSABS 0.1 0.0    Recent Labs  Lab 12/14/23 2150 12/14/23 2158 12/15/23 0252  NA 138  --  137  K 3.1*  --  3.8  CL 97*  --  98  CO2 25  --  25  ANIONGAP 16*   --  14  GLUCOSE 94  --  105*  BUN 12  --  13  CREATININE 1.04*  --  1.04*  AST 27  --  27  ALT 14  --  15  ALKPHOS 48  --  45  BILITOT 0.6  --  0.8  ALBUMIN 4.8  --  4.9  AMMONIA  --  26  --   MG 1.9  --   --   CALCIUM 8.9  --  9.4      Recent Labs  Lab 12/14/23 2150 12/14/23 2158 12/15/23 0252  AMMONIA  --  26  --   MG 1.9  --   --   CALCIUM 8.9  --  9.4    --------------------------------------------------------------------------------------------------------------- Lab Results  Component Value Date   CHOL 158 03/20/2023   HDL 53 03/20/2023   LDLCALC 90 03/20/2023   TRIG 76 03/20/2023   CHOLHDL 3.0 03/20/2023    Lab Results  Component Value Date   HGBA1C 5.2 03/20/2023   No results for input(s): "TSH", "T4TOTAL", "FREET4", "T3FREE", "THYROIDAB" in the last 72 hours. No results for input(s): "VITAMINB12", "FOLATE", "FERRITIN", "TIBC", "IRON", "RETICCTPCT" in the last 72 hours. ------------------------------------------------------------------------------------------------------------------ Cardiac Enzymes No results for input(s): "CKMB", "TROPONINI", "MYOGLOBIN" in the last 168 hours.  Invalid input(s): "CK"  Micro Results No results found for this or any previous visit (from the past 240 hours).  Radiology Report EEG adult Result Date: 12/15/2023 Arleene Lack, MD     12/15/2023  9:36 AM [name] MRN: [id number] Epilepsy Attending: Arleene Lack Referring Physician/Provider: Angelene Kelly, MD Date: 12/15/2023  Duration: 25.28 mins Patient history: 28yo F with ams. EEG to evaluate for seizure Level of alertness: Awake, asleep AEDs during EEG study: None Technical aspects: This EEG study was done with scalp electrodes positioned according to the 10-20 International system of electrode placement. Electrical activity was reviewed with band pass filter of 1-70Hz , sensitivity of 7 uV/mm, display speed of 13mm/sec with a 60Hz  notched  filter applied as appropriate. EEG data were recorded continuously and digitally stored.  Video monitoring was available and reviewed as appropriate. Description: The posterior dominant rhythm consists of 9 Hz activity of moderate voltage (25-35 uV) seen predominantly in posterior head regions, symmetric and reactive to eye opening and eye closing. Sleep was characterized by vertex waves, sleep spindles (12 to 14 Hz), maximal frontocentral region.  Hyperventilation and photic stimulation were not performed.   IMPRESSION: This study is within normal limits. No seizures or epileptiform discharges were seen throughout the recording. A normal interictal EEG does not exclude the diagnosis of epilepsy. Priyanka O Yadav     Signature  -   Lynnwood Sauer M.D on 12/16/2023 at 10:48 AM   -  To page go to [email or web address]

## 2023-12-16 NOTE — Plan of Care (Signed)
  Problem: Safety: Goal: Violent Restraint(s) Outcome: Progressing   Problem: Education: Goal: Knowledge of General Education information will improve Description: Including pain rating scale, medication(s)/side effects and non-pharmacologic comfort measures Outcome: Progressing   Problem: Health Behavior/Discharge Planning: Goal: Ability to manage health-related needs will improve Outcome: Progressing   Problem: Clinical Measurements: Goal: Ability to maintain clinical measurements within normal limits will improve Outcome: Progressing Goal: Will remain free from infection Outcome: Progressing Goal: Diagnostic test results will improve Outcome: Progressing Goal: Respiratory complications will improve Outcome: Progressing Goal: Cardiovascular complication will be avoided Outcome: Progressing   Problem: Activity: Goal: Risk for activity intolerance will decrease Outcome: Progressing   Problem: Nutrition: Goal: Adequate nutrition will be maintained Outcome: Progressing   Problem: Coping: Goal: Level of anxiety will decrease Outcome: Progressing   Problem: Elimination: Goal: Will not experience complications related to bowel motility Outcome: Progressing Goal: Will not experience complications related to urinary retention Outcome: Progressing   Problem: Pain Managment: Goal: General experience of comfort will improve and/or be controlled Outcome: Progressing   Problem: Safety: Goal: Ability to remain free from injury will improve Outcome: Progressing   Problem: Skin Integrity: Goal: Risk for impaired skin integrity will decrease Outcome: Progressing

## 2023-12-16 NOTE — Progress Notes (Signed)
 Patient agitated and began screaming that she wants her phone. RN explained that patient is not allowed to have her phone, with this patient began screaming and hitting her head on the bed rail. She got up and and threw water  and pushing table. RN attempted to verbally deescalate patient with Sharon Ward, with some success. Dr Zelda Hickman aware and came to bedside. See MAR.  1130:Patient calm and resting comfortably in bed.

## 2023-12-17 DIAGNOSIS — F314 Bipolar disorder, current episode depressed, severe, without psychotic features: Secondary | ICD-10-CM

## 2023-12-17 DIAGNOSIS — T50902A Poisoning by unspecified drugs, medicaments and biological substances, intentional self-harm, initial encounter: Secondary | ICD-10-CM | POA: Diagnosis not present

## 2023-12-17 MED ORDER — LORAZEPAM 1 MG PO TABS: 1.0000 mg | ORAL_TABLET | Freq: Once | ORAL | Status: DC | PRN

## 2023-12-17 MED ORDER — HALOPERIDOL LACTATE 5 MG/ML IJ SOLN
3.0000 mg | Freq: Once | INTRAMUSCULAR | Status: DC
  Filled 2023-12-17: qty 1
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
## 2023-12-17 NOTE — TOC Progression Note (Signed)
 Transition of Care [facility]) - Progression Note    Patient Details  Name: Sharon Ward MRN: [id number] Date of Birth: 17-Sep-1995  Transition of Care [facility]) CM/SW Contact  Adline [place], [place] Phone Number: 12/17/2023, 10:27 AM  Clinical Narrative:     Per MD, patient is medically ready for DC to a psych facility. CSW reached out to [facility] and they do not have a bed for patient today. CSW faxed patient out to facilities in the area. CSW will update, when one is available.   Expected Discharge Plan: Psychiatric Hospital Barriers to Discharge: Continued Medical Work up  Expected Discharge Plan and Services In-house Referral: Clinical Social Work                                             Social Determinants of Health (SDOH) Interventions SDOH Screenings   Food Insecurity: No Food Insecurity (07/05/2023)  Housing: Low Risk  (07/05/2023)  Transportation Needs: No Transportation Needs (07/05/2023)  Utilities: Not At Risk (07/05/2023)  Alcohol Screen: Low Risk  (07/05/2023)  Depression (PHQ2-9): Medium Risk (10/17/2023)  Tobacco Use: Medium Risk (12/15/2023)    Readmission Risk Interventions    04/06/2023    6:51 PM  Readmission Risk Prevention Plan  Transportation Screening Complete  Medication Review (RN Care Manager) Complete  PCP or Specialist appointment within 3-5 days of discharge Complete  HRI or Home Care Consult Complete  SW Recovery Care/Counseling Consult Complete  Palliative Care Screening Not Applicable  Skilled Nursing Facility Not Applicable

## 2023-12-17 NOTE — Plan of Care (Signed)
  Problem: Safety: Goal: Violent Restraint(s) Outcome: Progressing   Problem: Education: Goal: Knowledge of General Education information will improve Description: Including pain rating scale, medication(s)/side effects and non-pharmacologic comfort measures Outcome: Progressing   Problem: Health Behavior/Discharge Planning: Goal: Ability to manage health-related needs will improve Outcome: Progressing   Problem: Clinical Measurements: Goal: Ability to maintain clinical measurements within normal limits will improve Outcome: Progressing Goal: Will remain free from infection Outcome: Progressing Goal: Diagnostic test results will improve Outcome: Progressing Goal: Respiratory complications will improve Outcome: Progressing Goal: Cardiovascular complication will be avoided Outcome: Progressing   Problem: Activity: Goal: Risk for activity intolerance will decrease Outcome: Progressing   Problem: Nutrition: Goal: Adequate nutrition will be maintained Outcome: Progressing   Problem: Coping: Goal: Level of anxiety will decrease Outcome: Progressing   Problem: Elimination: Goal: Will not experience complications related to bowel motility Outcome: Progressing Goal: Will not experience complications related to urinary retention Outcome: Progressing   Problem: Pain Managment: Goal: General experience of comfort will improve and/or be controlled Outcome: Progressing   Problem: Safety: Goal: Ability to remain free from injury will improve Outcome: Progressing   Problem: Skin Integrity: Goal: Risk for impaired skin integrity will decrease Outcome: Progressing   Problem: Education: Goal: Knowledge of warning signs, risks, and behaviors that relate to suicide ideation and self-harm behaviors will improve Outcome: Progressing   Problem: Health Behavior/Discharge (Transition) Planning: Goal: Ability to manage health-related needs will improve Outcome: Progressing    Problem: Clinical Measurements: Goal: Remain free from any harm during hospitalization Outcome: Progressing   Problem: Nutrition: Goal: Adequate fluids and nutrition will be maintained Outcome: Progressing   Problem: Coping: Goal: Ability to disclose and discuss thoughts of suicide and self-harm will improve Outcome: Progressing   Problem: Medication Management: Goal: Adhere to prescribed medication regimen Outcome: Progressing   Problem: Sleep Hygiene: Goal: Ability to obtain adequate restful sleep will improve Outcome: Progressing   Problem: Self Esteem: Goal: Ability to verbalize positive feeling about self will improve Outcome: Progressing   Problem: Safety: Goal: Non-violent Restraint(s) Outcome: Progressing   Problem: BH CCP BIPOLAR DISORDER-MANIA/HYPOMANIA Goal: STG: Nancie will identify cognitive patterns and beliefs that interfere with therapy Outcome: Progressing Goal: STG: Sinai will attend at least 80% of scheduled follow-up medication management appointments Outcome: Progressing Goal: STG: Margrit will attend at least 80% of scheduled IOP sessions  Outcome: Progressing Goal: STG: [place] will attend at least 80% of scheduled PHP sessions    Outcome: Progressing Goal: STG: Johnice will attend at least 80% of scheduled group psychotherapy sessions  Outcome: Progressing Goal: STG: Gerda will complete at least 80% of assigned homework  Outcome: Progressing   Problem: Safety: Goal: Non-violent Restraint(s) Outcome: Progressing
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
## 2023-12-17 NOTE — Progress Notes (Signed)
 PROGRESS NOTE                                                                                                                                                                                                             Patient Demographics:    Sharon Ward, is a 28 y.o. female, DOB - [birth year], UXL:[id number]  Outpatient Primary MD for the patient is Sharon Coombe, MD    LOS - 1  Admit date - 12/14/2023    Chief Complaint  Patient presents with   Drug Overdose       Brief Narrative (HPI from H&P)    28 y.o. female with history of bipolar disorder/depression with multiple prior suicide attempts was brought to the ER after patient was found to be unresponsive.  As per the report patient called EMS and on EMS arrival patient was found to be unresponsive with empty bottles of Abilify  around her.  There was also report of possible carbamazepine  bottles.  There was also a suicide note.  Later patient became more agitated and was given Versed  Haldol  and ketamine.  There was some concern for brief episode of seizure.  Patient also threw up once.    Subjective:   Patient in bed appears to be in no distress denies any headache chest pain or abdominal pain, says she feels horrible due to the stress in her life.   Assessment  & Plan :   Intentional drug overdose with suicide attempt with history of bipolar disorder/depression -patient is sleeping but easily arousable, had intentional overdose with Abilify , now clinically stable, psychiatry on board, suicide precautions, medically cleared for [facility] transfer pending bed availability. Acute renal failure - from dehydration, solved after IV fluids. Hypokalemia placed in stable Prolonged QTc - K replaced, EKG subsequently on 12/15/2023 stable. Dehydration and hypotension. hydrated and improved, baseline SBPs chronically are in 90s.        Condition - Fair  Family Communication  :      Code Status :  Full  Consults  :  Psych  PUD Prophylaxis :     Procedures  :     EEG - Stable      Disposition Plan  :    Status is: Observation   DVT Prophylaxis  :    SCDs Start: 12/15/23 0251    Lab Results  Component Value Date   PLT 314 12/16/2023    Diet :  Diet Order     None        Inpatient Medications  Scheduled Meds:  cariprazine   1.5 mg Oral QHS   LORazepam   1 mg Oral BID   Continuous Infusions:   PRN Meds:.acetaminophen , haloperidol  lactate, LORazepam , prochlorperazine     Objective:   Vitals:   12/16/23 1100 12/16/23 2210 12/17/23 0005 12/17/23 0832  BP:  (!) 101/47 (!) 99/49 103/61  Pulse: 76 61 (!) 57 (!) 57  Resp: 18 19 18    Temp:  97.8 F (36.6 C) 97.8 F (36.6 C)   TempSrc:  Oral Oral   SpO2: 99% 97% 97%     Wt Readings from Last 3 Encounters:  12/07/23 69.4 kg  10/17/23 77 kg  08/26/23 76.7 kg     Intake/Output Summary (Last 24 hours) at 12/17/2023 0843 Last data filed at 12/16/2023 2215 Gross per 24 hour  Intake 1259.25 ml  Output --  Net 1259.25 ml     Physical Exam  Awake Alert, No new F.N deficits, agitated affect [place].AT,PERRAL Supple Neck, No JVD,   Symmetrical Chest wall movement, Good air movement bilaterally, CTAB RRR,No Gallops,Rubs or new Murmurs,  +ve B.Sounds, Abd Soft, No tenderness,   No Cyanosis, Clubbing or edema       Data Review:    Recent Labs  Lab 12/14/23 2211 12/15/23 0252 12/16/23 0950  WBC 6.9 11.1* 4.9  HGB 14.1 13.7 12.5  HCT 43.5 42.4 38.5  PLT 426* 427* 314  MCV 85.3 86.7 86.1  MCH 27.6 28.0 28.0  MCHC 32.4 32.3 32.5  RDW 14.6 14.6 14.7  LYMPHSABS 1.8 1.9 1.8  MONOABS 0.7 0.6 0.4  EOSABS 0.1 0.0 0.1  BASOSABS 0.1 0.0 0.1    Recent Labs  Lab 12/14/23 2150 12/14/23 2158 12/15/23 0252 12/16/23 0950  NA 138  --  137 142  K 3.1*  --  3.8 3.7  CL 97*  --  98 102  CO2 25  --  25 29  ANIONGAP 16*  --  14 11  GLUCOSE 94  --  105* 85  BUN 12  --  13 15   CREATININE 1.04*  --  1.04* 0.89  AST 27  --  27 20  ALT 14  --  15 14  ALKPHOS 48  --  45 40  BILITOT 0.6  --  0.8 1.0  ALBUMIN 4.8  --  4.9 3.9  AMMONIA  --  26  --   --   MG 1.9  --   --  2.1  PHOS  --   --   --  3.1  CALCIUM 8.9  --  9.4 9.3      Recent Labs  Lab 12/14/23 2150 12/14/23 2158 12/15/23 0252 12/16/23 0950  AMMONIA  --  26  --   --   MG 1.9  --   --  2.1  CALCIUM 8.9  --  9.4 9.3    --------------------------------------------------------------------------------------------------------------- Lab Results  Component Value Date   CHOL 158 03/20/2023   HDL 53 03/20/2023   LDLCALC 90 03/20/2023   TRIG 76 03/20/2023   CHOLHDL 3.0 03/20/2023    Lab Results  Component Value Date   HGBA1C 5.2 03/20/2023   No results for input(s): "TSH", "T4TOTAL", "FREET4", "T3FREE", "THYROIDAB" in the last 72 hours. No results for input(s): "VITAMINB12", "FOLATE", "FERRITIN", "TIBC", "IRON", "RETICCTPCT" in the last 72 hours. ------------------------------------------------------------------------------------------------------------------ Cardiac Enzymes  No results for input(s): "CKMB", "TROPONINI", "MYOGLOBIN" in the last 168 hours.  Invalid input(s): "CK"  Micro Results No results found for this or any previous visit (from the past 240 hours).  Radiology Report EEG adult Result Date: 12/15/2023 Sharon Lack, MD     12/15/2023  9:36 AM [name] MRN: [id number] Epilepsy Attending: Arleene Ward Referring Physician/Provider: Angelene Kelly, MD Date: 12/15/2023  Duration: 25.28 mins Patient history: 28yo F with ams. EEG to evaluate for seizure Level of alertness: Awake, asleep AEDs during EEG study: None Technical aspects: This EEG study was done with scalp electrodes positioned according to the 10-20 International system of electrode placement. Electrical activity was reviewed with band pass filter of 1-70Hz , sensitivity of 7 uV/mm, display  speed of 46mm/sec with a 60Hz  notched filter applied as appropriate. EEG data were recorded continuously and digitally stored.  Video monitoring was available and reviewed as appropriate. Description: The posterior dominant rhythm consists of 9 Hz activity of moderate voltage (25-35 uV) seen predominantly in posterior head regions, symmetric and reactive to eye opening and eye closing. Sleep was characterized by vertex waves, sleep spindles (12 to 14 Hz), maximal frontocentral region.  Hyperventilation and photic stimulation were not performed.   IMPRESSION: This study is within normal limits. No seizures or epileptiform discharges were seen throughout the recording. A normal interictal EEG does not exclude the diagnosis of epilepsy. Priyanka O Yadav     Signature  -   Lynnwood Sauer M.D on 12/17/2023 at 8:43 AM   -  To page go to [email or web address]

## 2023-12-17 NOTE — TOC Transition Note (Addendum)
 Transition of Care [facility] - Discharge Note   Patient Details  Name: Sharon Ward MRN: [id number] Date of Birth: Sep 18, 1995  Transition of Care [facility]) CM/SW Contact:  Adline Hook, LCSW Phone Number: 12/17/2023, 2:11 PM   Clinical Narrative:     3:30pm- The sheriffs office is unable to transport patient until tomorrow 12/18/23. MD, nurse and patient aware.  Per MD patient ready for DC to [facility]. RN, patient, and facility notified of DC. Discharge Summary and VC paperwork has been sent to facility. DC packet on chart. Sheriff transport has been requested for after 5pm, bed will be available at 6pm.   Pt has been accepted by Dr. Allan Ishihara. Patient will go to [facility].    RN to call report to [phone].  CSW will sign off for now as social work intervention is no longer needed. Please consult us  again if new needs arise.    Final next level of care: Psychiatric Hospital Barriers to Discharge: Continued Medical Work up   Patient Goals and CMS Choice            Discharge Placement              Patient chooses bed at: Other - please specify in the comment section below: [facility]) Patient to be transferred to facility by: Sheriffs office   Patient and family notified of of transfer: 12/17/23  Discharge Plan and Services Additional resources added to the After Visit Summary for   In-house Referral: Clinical Social Work                                   Social Drivers of Health (SDOH) Interventions SDOH Screenings   Food Insecurity: No Food Insecurity (07/05/2023)  Housing: Low Risk  (07/05/2023)  Transportation Needs: No Transportation Needs (07/05/2023)  Utilities: Not At Risk (07/05/2023)  Alcohol Screen: Low Risk  (07/05/2023)  Depression (PHQ2-9): Medium Risk (10/17/2023)  Tobacco Use: Medium Risk (12/15/2023)     Readmission Risk Interventions    04/06/2023    6:51 PM  Readmission Risk Prevention Plan   Transportation Screening Complete  Medication Review (RN Care Manager) Complete  PCP or Specialist appointment within 3-5 days of discharge Complete  HRI or Home Care Consult Complete  SW Recovery Care/Counseling Consult Complete  Palliative Care Screening Not Applicable  Skilled Nursing Facility Not Applicable

## 2023-12-17 NOTE — Discharge Summary (Addendum)
 [address] Sharon Ward ZOX:[id number] DOB: April 15, 1996 DOA: 12/14/2023  PCP: Roslyn Coombe, MD  Admit date: 12/14/2023  Discharge date: 12/17/2023  Admitted From: Home   Disposition:  [facility]   Recommendations for Outpatient Follow-up:   Follow up with PCP in 1-2 weeks  PCP Please obtain BMP/CBC, 2 view CXR in 1week,  (see Discharge instructions)   PCP Please follow up on the following pending results:     Home Health: None Equipment/Devices: None Consultations: Psychiatry Discharge Condition: Stable    CODE STATUS: Full    Diet Recommendation: Heart Healthy      Chief Complaint  Patient presents with   Drug Overdose     Brief history of present illness from the day of admission and additional interim summary    28 y.o. female with history of bipolar disorder/depression with multiple prior suicide attempts was brought to the ER after patient was found to be unresponsive.  As per the report patient called EMS and on EMS arrival patient was found to be unresponsive with empty bottles of Abilify  around her.  There was also report of possible carbamazepine  bottles.  There was also a suicide note.  Later patient became more agitated and was given Versed  Haldol  and ketamine.  There was some concern for brief episode of seizure.  Patient also threw up once.                                                                  Hospital Course   Intentional drug overdose with suicide attempt with history of bipolar disorder/depression -patient is sleeping but easily arousable, had intentional overdose with Abilify , now clinically stable, psychiatry on board, suicide precautions, medically cleared for [facility] transfer pending bed availability. Acute renal failure - from dehydration, solved after IV fluids. Hypokalemia placed in  stable Prolonged QTc - K replaced, EKG subsequently on 12/15/2023 stable. Dehydration and hypotension. hydrated and improved, baseline SBPs chronically are in 90s.    Patient has outbursts of violent and abusive behavior, extensively counseled, DW Psych x 3, Haldol , counseling, patient clearly warned if all fails she will have to restrained for her and staff safety.   Discharge diagnosis     Principal Problem:   Drug overdose Active Problems:   Bipolar 1 disorder, depressed, severe (HCC)   Suicide attempt (HCC)   ARF (acute renal failure) (HCC)   Hypokalemia    Discharge instructions    Discharge Instructions     Diet - low sodium heart healthy   Complete by: As directed    Discharge instructions   Complete by: As directed    Follow with Primary MD Roslyn Coombe, MD in 7 days   Activity: As tolerated with Full fall precautions use walker/cane & assistance as needed  Disposition [facility]  Diet: Heart Healthy   Special Instructions: If you have smoked or chewed Tobacco  in the last 2 yrs please stop smoking, stop any regular Alcohol  and or any Recreational drug use.  On your next visit with your primary care physician please Get Medicines reviewed and adjusted.  Please request your Prim.MD to go over all Hospital Tests and Procedure/Radiological results at the follow up, please get all Hospital records sent to your Prim MD by signing hospital release before you go home.  If you experience worsening of your admission symptoms, develop shortness of breath, life threatening emergency, suicidal or homicidal thoughts you must seek medical attention immediately by calling 911 or calling your MD immediately  if symptoms less severe.  You Must read complete instructions/literature along with all the possible adverse reactions/side effects for all the Medicines you take and that have been prescribed to you. Take any new Medicines after you have completely understood and accpet  all the possible adverse reactions/side effects.   Do not drive when taking Pain medications.  Do not take more than prescribed Pain, Sleep and Anxiety Medications  Wear Seat belts while driving.   Please note  You were cared for by a hospitalist during your hospital stay. If you have any questions about your discharge medications or the care you received while you were in the hospital after you are discharged, you can call the unit and asked to speak with the hospitalist on call if the hospitalist that took care of you is not available. Once you are discharged, your primary care physician will handle any further medical issues. Please note that NO REFILLS for any discharge medications will be authorized once you are discharged, as it is imperative that you return to your primary care physician (or establish a relationship with a primary care physician if you do not have one) for your aftercare needs so that they can reassess your need for medications and monitor your lab values.   Increase activity slowly   Complete by: As directed        Discharge Medications   Allergies as of 12/17/2023       Reactions   Ascorbate Rash   Ascorbic Acid Hives, Rash   Citrus Rash   Coconut Flavoring Agent (non-screening) Rash   Lamotrigine Rash, Hives   Other Reaction(s): Other (See Comments) Unknown by patient   Latex Rash, Other (See Comments)   Skin turns red and breaks out where placed   Orange (diagnostic) Rash   Peach Flavoring Agent (non-screening) Rash   Pear Rash   Pineapple Rash   Prunus Persica Hives, Rash   Tape Other (See Comments), Rash   Skin turns red and breaks out where placed        Medication List     STOP taking these medications    benztropine  1 MG tablet Commonly known as: COGENTIN    clonazePAM  0.5 MG tablet Commonly known as: KLONOPIN    diphenhydrAMINE  50 MG capsule Commonly known as: BENADRYL    divalproex  500 MG DR tablet Commonly known as: DEPAKOTE     paliperidone  6 MG 24 hr tablet Commonly known as: INVEGA        TAKE these medications    Vraylar  1.5 MG capsule Generic drug: cariprazine  Take 1.5 mg by mouth at bedtime.          Major procedures and Radiology Reports - PLEASE review detailed and final reports thoroughly  -      EEG adult Result Date: 12/15/2023 Yadav, Priyanka  Deanna Expose, MD     12/15/2023  9:36 AM Patient Name: Miatta Maravilla MRN: [id number] Epilepsy Attending: Arleene Lack Referring Physician/Provider: Angelene Kelly, MD Date: 12/15/2023  Duration: 25.28 mins Patient history: 28yo F with ams. EEG to evaluate for seizure Level of alertness: Awake, asleep AEDs during EEG study: None Technical aspects: This EEG study was done with scalp electrodes positioned according to the 10-20 International system of electrode placement. Electrical activity was reviewed with band pass filter of 1-70Hz , sensitivity of 7 uV/mm, display speed of 70mm/sec with a 60Hz  notched filter applied as appropriate. EEG data were recorded continuously and digitally stored.  Video monitoring was available and reviewed as appropriate. Description: The posterior dominant rhythm consists of 9 Hz activity of moderate voltage (25-35 uV) seen predominantly in posterior head regions, symmetric and reactive to eye opening and eye closing. Sleep was characterized by vertex waves, sleep spindles (12 to 14 Hz), maximal frontocentral region.  Hyperventilation and photic stimulation were not performed.   IMPRESSION: This study is within normal limits. No seizures or epileptiform discharges were seen throughout the recording. A normal interictal EEG does not exclude the diagnosis of epilepsy. Priyanka O Yadav    Micro Results    No results found for this or any previous visit (from the past 240 hours).  Today   Subjective    Nykole Beil today has no headache,no chest abdominal pain,no new weakness tingling or numbness, feels much better wants to go  home today.    Objective   Blood pressure (!) 99/53, pulse (!) 57, temperature 97.8 F (36.6 C), temperature source Oral, resp. rate 18, SpO2 97%, unknown if currently breastfeeding.   Intake/Output Summary (Last 24 hours) at 12/17/2023 1256 Last data filed at 12/16/2023 2215 Gross per 24 hour  Intake 1259.25 ml  Output --  Net 1259.25 ml    Exam  Awake Alert, No new F.N deficits,    [place].AT,PERRAL Supple Neck,   Symmetrical Chest wall movement, Good air movement bilaterally, CTAB RRR,No Gallops,   +ve B.Sounds, Abd Soft, Non tender,  No Cyanosis, Clubbing or edema    Data Review   Recent Labs  Lab 12/14/23 2211 12/15/23 0252 12/16/23 0950  WBC 6.9 11.1* 4.9  HGB 14.1 13.7 12.5  HCT 43.5 42.4 38.5  PLT 426* 427* 314  MCV 85.3 86.7 86.1  MCH 27.6 28.0 28.0  MCHC 32.4 32.3 32.5  RDW 14.6 14.6 14.7  LYMPHSABS 1.8 1.9 1.8  MONOABS 0.7 0.6 0.4  EOSABS 0.1 0.0 0.1  BASOSABS 0.1 0.0 0.1    Recent Labs  Lab 12/14/23 2150 12/14/23 2158 12/15/23 0252 12/16/23 0950  NA 138  --  137 142  K 3.1*  --  3.8 3.7  CL 97*  --  98 102  CO2 25  --  25 29  ANIONGAP 16*  --  14 11  GLUCOSE 94  --  105* 85  BUN 12  --  13 15  CREATININE 1.04*  --  1.04* 0.89  AST 27  --  27 20  ALT 14  --  15 14  ALKPHOS 48  --  45 40  BILITOT 0.6  --  0.8 1.0  ALBUMIN 4.8  --  4.9 3.9  AMMONIA  --  26  --   --   MG 1.9  --   --  2.1  PHOS  --   --   --  3.1  CALCIUM 8.9  --  9.4 9.3  Total Time in preparing paper work, data evaluation and todays exam - 35 minutes  Signature  -    Lynnwood Sauer M.D on 12/17/2023 at 12:56 PM   -  To page go to [email or web address]

## 2023-12-17 NOTE — Progress Notes (Signed)
 AVS printed and in chart for transport.

## 2023-12-17 NOTE — Discharge Instructions (Signed)
 Follow with Primary MD Roslyn Coombe, MD in 7 days   Activity: As tolerated with Full fall precautions use walker/cane & assistance as needed  Disposition [facility]  Diet: Heart Healthy   Special Instructions: If you have smoked or chewed Tobacco  in the last 2 yrs please stop smoking, stop any regular Alcohol  and or any Recreational drug use.  On your next visit with your primary care physician please Get Medicines reviewed and adjusted.  Please request your Prim.MD to go over all Hospital Tests and Procedure/Radiological results at the follow up, please get all Hospital records sent to your Prim MD by signing hospital release before you go home.  If you experience worsening of your admission symptoms, develop shortness of breath, life threatening emergency, suicidal or homicidal thoughts you must seek medical attention immediately by calling 911 or calling your MD immediately  if symptoms less severe.  You Must read complete instructions/literature along with all the possible adverse reactions/side effects for all the Medicines you take and that have been prescribed to you. Take any new Medicines after you have completely understood and accpet all the possible adverse reactions/side effects.   Do not drive when taking Pain medications.  Do not take more than prescribed Pain, Sleep and Anxiety Medications  Wear Seat belts while driving.   Please note  You were cared for by a hospitalist during your hospital stay. If you have any questions about your discharge medications or the care you received while you were in the hospital after you are discharged, you can call the unit and asked to speak with the hospitalist on call if the hospitalist that took care of you is not available. Once you are discharged, your primary care physician will handle any further medical issues. Please note that NO REFILLS for any discharge medications will be authorized once you are discharged, as it is  imperative that you return to your primary care physician (or establish a relationship with a primary care physician if you do not have one) for your aftercare needs so that they can reassess your need for medications and monitor your lab values.

## 2023-12-18 DIAGNOSIS — T50902A Poisoning by unspecified drugs, medicaments and biological substances, intentional self-harm, initial encounter: Secondary | ICD-10-CM | POA: Diagnosis not present

## 2023-12-18 NOTE — Plan of Care (Signed)
   Problem: Education: Goal: Knowledge of General Education information will improve Description Including pain rating scale, medication(s)/side effects and non-pharmacologic comfort measures Outcome: Progressing

## 2023-12-18 NOTE — Progress Notes (Signed)
 [facility]                                                                                                                                                                         Patient Demographics  Sharon Ward, is a 28 y.o. female  ZOX:[id number]  WJX:[id number]  DOB - 1996/07/11  Admit date - 12/14/2023  Admitting Physician Cala Castleman, MD  Outpatient Primary MD for the patient is Roslyn Coombe, MD  LOS - 2   Chief Complaint  Patient presents with   Drug Overdose        Assessment & Plan    Patient seen briefly today due for discharge soon per Discharge done yesterday by me, no further issues, currently she is calm, she remains under IVC, if agitated has been told will be restrained, we await a behavioral health bed to open up.  Clinically stable.  Of note patient has chronically low blood pressure especially when she is sleeping.  Baseline SBPs are around 90 in daytime.  She is currently symptom-free and calm.      Medications  Scheduled Meds:  cariprazine   1.5 mg Oral QHS   haloperidol  lactate  3 mg Intramuscular Once   LORazepam   1 mg Oral BID   Continuous Infusions: PRN Meds:.acetaminophen , haloperidol  lactate, LORazepam , LORazepam , prochlorperazine     Time Spent in minutes   10 minutes   Sharon Ward M.D on 12/18/2023 at 8:06 AM  Between 7am to 7pm - Pager - [phone]  After 7pm go to [email or web address] - password [email or web address]  And look for the night coverage person covering for me after hours  [facility] Office  [phone]    Subjective:   [name] today has, No headache, No chest pain, No abdominal pain - No Nausea, No new weakness tingling or numbness, No Cough - SOB.   Objective:   Vitals:   12/18/23 0019 12/18/23 0054 12/18/23 0238 12/18/23 0407  BP: (!) 80/49 (!) 89/49 (!) 105/56 (!) 89/48  Pulse: 66 65 (!) 54 63  Resp: 16 16  16   Temp: (!)  97.5 F (36.4 C) (!) 97.5 F (36.4 C)  (!) 97.5 F (36.4 C)  TempSrc: Axillary Axillary  Axillary  SpO2: 97%   96%    Wt Readings from Last 3 Encounters:  12/07/23 69.4 kg  10/17/23 77 kg  08/26/23 76.7 kg     Intake/Output Summary (Last 24 hours) at 12/18/2023 0806 Last data filed at 12/17/2023 2100 Gross per 24 hour  Intake 0 ml  Output --  Net 0 ml    Exam  Awake Alert, No new F.N deficits,   [place].AT,PERRAL Supple Neck, No JVD,   Symmetrical Chest wall movement, Good air  movement bilaterally, CTAB RRR,No Gallops, Rubs or new Murmurs,  +ve B.Sounds, Abd Soft, No tenderness,   No Cyanosis, Clubbing or edema   Data Review

## 2024-01-06 ENCOUNTER — Encounter (HOSPITAL_COMMUNITY): Payer: Self-pay | Admitting: Student

## 2024-01-06 ENCOUNTER — Other Ambulatory Visit (HOSPITAL_COMMUNITY): Payer: Self-pay | Admitting: Student

## 2024-01-06 ENCOUNTER — Encounter (HOSPITAL_COMMUNITY): Payer: MEDICAID | Admitting: Student

## 2024-01-06 DIAGNOSIS — F17200 Nicotine dependence, unspecified, uncomplicated: Secondary | ICD-10-CM

## 2024-01-06 DIAGNOSIS — F1290 Cannabis use, unspecified, uncomplicated: Secondary | ICD-10-CM

## 2024-01-06 DIAGNOSIS — F603 Borderline personality disorder: Secondary | ICD-10-CM

## 2024-01-06 DIAGNOSIS — R9431 Abnormal electrocardiogram [ECG] [EKG]: Secondary | ICD-10-CM

## 2024-01-06 DIAGNOSIS — Z9151 Personal history of suicidal behavior: Secondary | ICD-10-CM

## 2024-01-06 DIAGNOSIS — Z8659 Personal history of other mental and behavioral disorders: Secondary | ICD-10-CM

## 2024-01-06 DIAGNOSIS — F411 Generalized anxiety disorder: Secondary | ICD-10-CM

## 2024-01-06 DIAGNOSIS — F4312 Post-traumatic stress disorder, chronic: Secondary | ICD-10-CM

## 2024-01-06 DIAGNOSIS — Z79899 Other long term (current) drug therapy: Secondary | ICD-10-CM

## 2024-01-06 DIAGNOSIS — F314 Bipolar disorder, current episode depressed, severe, without psychotic features: Secondary | ICD-10-CM

## 2024-01-06 DIAGNOSIS — Z91148 Patient's other noncompliance with medication regimen for other reason: Secondary | ICD-10-CM

## 2024-01-06 DIAGNOSIS — F1210 Cannabis abuse, uncomplicated: Secondary | ICD-10-CM

## 2024-01-06 DIAGNOSIS — F4310 Post-traumatic stress disorder, unspecified: Secondary | ICD-10-CM

## 2024-01-06 MED ORDER — ARIPIPRAZOLE 5 MG PO TABS: 7.5000 mg | ORAL_TABLET | Freq: Every day | ORAL | 3 refills | Status: DC

## 2024-01-06 MED ORDER — ARIPIPRAZOLE 5 MG PO TABS: 5.0000 mg | ORAL_TABLET | Freq: Every day | ORAL | 0 refills | Status: DC
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
## 2024-01-06 NOTE — Addendum Note (Signed)
 Addended by: Fatimah Sundquist on: 01/06/2024 12:35 PM   Modules accepted: Orders
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
## 2024-01-06 NOTE — Progress Notes (Signed)
 See erroneous encounter note for full progress note

## 2024-01-18 ENCOUNTER — Encounter (HOSPITAL_COMMUNITY): Payer: Self-pay

## 2024-01-26 ENCOUNTER — Ambulatory Visit: Payer: MEDICAID | Admitting: Internal Medicine

## 2024-01-26 ENCOUNTER — Ambulatory Visit (HOSPITAL_COMMUNITY)
Admission: EM | Admit: 2024-01-26 | Discharge: 2024-01-27 | Disposition: A | Discharge status: Other Acute Inpatient Hospital | Payer: MEDICAID | Attending: Behavioral Health | Admitting: Behavioral Health

## 2024-01-26 DIAGNOSIS — R45851 Suicidal ideations: Secondary | ICD-10-CM | POA: Diagnosis not present

## 2024-01-26 DIAGNOSIS — Z9152 Personal history of nonsuicidal self-harm: Secondary | ICD-10-CM

## 2024-01-26 DIAGNOSIS — F603 Borderline personality disorder: Secondary | ICD-10-CM

## 2024-01-26 DIAGNOSIS — R4588 Nonsuicidal self-harm: Secondary | ICD-10-CM | POA: Diagnosis not present

## 2024-01-26 LAB — CBC WITH DIFFERENTIAL/PLATELET
Abs Immature Granulocytes: 0.01 10*3/uL (ref 0.00–0.07)
Basophils Absolute: 0 10*3/uL (ref 0.0–0.1)
Basophils Relative: 1 %
Eosinophils Absolute: 0 10*3/uL (ref 0.0–0.5)
Eosinophils Relative: 0 %
HCT: 35.4 % — ABNORMAL LOW (ref 36.0–46.0)
Hemoglobin: 11.4 g/dL — ABNORMAL LOW (ref 12.0–15.0)
Immature Granulocytes: 0 %
Lymphocytes Relative: 24 %
Lymphs Abs: 1.2 10*3/uL (ref 0.7–4.0)
MCH: 27.3 pg (ref 26.0–34.0)
MCHC: 32.2 g/dL (ref 30.0–36.0)
MCV: 84.9 fL (ref 80.0–100.0)
Monocytes Absolute: 0.3 10*3/uL (ref 0.1–1.0)
Monocytes Relative: 7 %
Neutro Abs: 3.5 10*3/uL (ref 1.7–7.7)
Neutrophils Relative %: 68 %
Platelets: 226 10*3/uL (ref 150–400)
RBC: 4.17 MIL/uL (ref 3.87–5.11)
RDW: 14.7 % (ref 11.5–15.5)
WBC: 5.1 10*3/uL (ref 4.0–10.5)
nRBC: 0 % (ref 0.0–0.2)

## 2024-01-26 LAB — LIPID PANEL
Cholesterol: 172 mg/dL (ref 0–200)
HDL: 74 mg/dL (ref >40)
LDL Cholesterol: 89 mg/dL (ref 0–99)
Total CHOL/HDL Ratio: 2.3 ratio
Triglycerides: 43 mg/dL (ref <150)
VLDL: 9 mg/dL (ref 0–40)

## 2024-01-26 LAB — COMPREHENSIVE METABOLIC PANEL WITH GFR
ALT: 10 U/L (ref 0–44)
AST: 15 U/L (ref 15–41)
Albumin: 4.5 g/dL (ref 3.5–5.0)
Alkaline Phosphatase: 40 U/L (ref 38–126)
Anion gap: 10 (ref 5–15)
BUN: 10 mg/dL (ref 6–20)
CO2: 23 mmol/L (ref 22–32)
Calcium: 9.6 mg/dL (ref 8.9–10.3)
Chloride: 105 mmol/L (ref 98–111)
Creatinine, Ser: 0.88 mg/dL (ref 0.44–1.00)
GFR, Estimated: >60 mL/min (ref >60)
Glucose, Bld: 98 mg/dL (ref 70–99)
Potassium: 3.5 mmol/L (ref 3.5–5.1)
Sodium: 138 mmol/L (ref 135–145)
Total Bilirubin: 0.8 mg/dL (ref 0.0–1.2)
Total Protein: 7.6 g/dL (ref 6.5–8.1)

## 2024-01-26 LAB — POCT URINE DRUG SCREEN - MANUAL ENTRY (I-SCREEN)
POC Amphetamine UR: NOT DETECTED
POC Buprenorphine (BUP): NOT DETECTED
POC Cocaine UR: NOT DETECTED
POC Marijuana UR: POSITIVE — AB
POC Methadone UR: NOT DETECTED
POC Methamphetamine UR: NOT DETECTED
POC Morphine: NOT DETECTED
POC Oxazepam (BZO): NOT DETECTED
POC Oxycodone UR: NOT DETECTED
POC Secobarbital (BAR): NOT DETECTED

## 2024-01-26 LAB — POCT PREGNANCY, URINE: Preg Test, Ur: NEGATIVE

## 2024-01-26 LAB — HEMOGLOBIN A1C
Hgb A1c MFr Bld: 4.8 % (ref 4.8–5.6)
Mean Plasma Glucose: 91.06 mg/dL

## 2024-01-26 LAB — ETHANOL: Alcohol, Ethyl (B): <15 mg/dL (ref <15)

## 2024-01-26 LAB — TSH: TSH: 0.889 u[IU]/mL (ref 0.350–4.500)

## 2024-01-26 MED ORDER — HYDROXYZINE HCL 25 MG PO TABS: 25.0000 mg | ORAL_TABLET | Freq: Three times a day (TID) | ORAL | Status: DC | PRN

## 2024-01-26 MED ORDER — DIPHENHYDRAMINE HCL 50 MG PO CAPS: 50.0000 mg | ORAL_CAPSULE | Freq: Three times a day (TID) | ORAL | Status: DC | PRN

## 2024-01-26 MED ORDER — HALOPERIDOL LACTATE 5 MG/ML IJ SOLN
10.0000 mg | Freq: Three times a day (TID) | INTRAMUSCULAR | Status: DC | PRN
  Filled 2024-01-26: qty 2

## 2024-01-26 MED ORDER — MAGNESIUM HYDROXIDE 400 MG/5ML PO SUSP: 30.0000 mL | Freq: Every day | ORAL | Status: DC | PRN

## 2024-01-26 MED ORDER — LORAZEPAM 2 MG/ML IJ SOLN: 2.0000 mg | Freq: Three times a day (TID) | INTRAMUSCULAR | Status: DC | PRN

## 2024-01-26 MED ORDER — CARIPRAZINE HCL 1.5 MG PO CAPS
1.5000 mg | ORAL_CAPSULE | Freq: Every day | ORAL | Status: DC
Start: 2024-01-26 — End: 2024-01-27
  Administered 2024-01-26: 1.5 mg via ORAL
  Filled 2024-01-26: qty 1

## 2024-01-26 MED ORDER — ALUM & MAG HYDROXIDE-SIMETH 200-200-20 MG/5ML PO SUSP: 30.0000 mL | ORAL | Status: DC | PRN

## 2024-01-26 MED ORDER — TRAZODONE HCL 50 MG PO TABS
50.0000 mg | ORAL_TABLET | Freq: Every evening | ORAL | Status: DC | PRN
  Administered 2024-01-26: 50 mg via ORAL
  Filled 2024-01-26: qty 1

## 2024-01-26 MED ORDER — DIPHENHYDRAMINE HCL 50 MG/ML IJ SOLN
50.0000 mg | Freq: Three times a day (TID) | INTRAMUSCULAR | Status: DC | PRN
  Filled 2024-01-26: qty 1

## 2024-01-26 MED ORDER — ACETAMINOPHEN 325 MG PO TABS: 650.0000 mg | ORAL_TABLET | Freq: Four times a day (QID) | ORAL | Status: DC | PRN

## 2024-01-26 MED ORDER — LORAZEPAM 2 MG/ML IJ SOLN
2.0000 mg | Freq: Three times a day (TID) | INTRAMUSCULAR | Status: DC | PRN
  Filled 2024-01-26 (×2): qty 1

## 2024-01-26 MED ORDER — HALOPERIDOL 5 MG PO TABS: 5.0000 mg | ORAL_TABLET | Freq: Three times a day (TID) | ORAL | Status: DC | PRN

## 2024-01-26 MED ORDER — HALOPERIDOL LACTATE 5 MG/ML IJ SOLN: 5.0000 mg | Freq: Three times a day (TID) | INTRAMUSCULAR | Status: DC | PRN

## 2024-01-26 MED ORDER — DIPHENHYDRAMINE HCL 50 MG/ML IJ SOLN: 50.0000 mg | Freq: Three times a day (TID) | INTRAMUSCULAR | Status: DC | PRN

## 2024-01-26 NOTE — ED Notes (Signed)
 1753: pt upset about placement to [facility]. Banged head on wall and stating that she does not want to go there. Deescalation in progress.

## 2024-01-26 NOTE — ED Provider Notes (Addendum)
 Behavioral Health Urgent Care Medical Screening Exam  Date: 01/26/24 [name] MRN: [id number] Chief Complaint: Per Triage note, Sharon Ward: Patient presents to the [facility] stating that she is depressed and and states that she has been having suicidal thought and has been dong some superficial cutting on her thighs.  Patient states that she was psychiatrically hospitalized  in May for a sucide attempt by overdose.  She states that her grandfather recently died and she states that she has not handled this loss very well.  Patient states that she has been really sad and agitated at time.  Patient states that she has been punching walls and banging her head on the wall.  Patient denies HI, but states that she has thoughts about wanting to fight her aunt.  Patient denies any history of psychosis. Patient states that she has not been sleeping or eating much and states that she has lost a significant amount of weight.  Patient states that she has been drinking1/2 to 1 tall boy beer daily and has been smoking marijuana daily. Diagnoses:  Final diagnoses:  Borderline personality disorder (HCC)  History of non-suicidal self-harm  Suicidal thoughts    HPI: History of Present illness: Sharon Ward is a 28 y.o. female patient with a past psychiatric history significant for borderline personality disorder, bipolar 1 disorder, self-harm behaviors, depression and suicide attempts by overdose who presented to the [facility] voluntary with complaints of suicidal thoughts, self-harm behaviors by cutting and mood instability. She states that she has been struggling with her mental health recently and last month she was hospitalized for drug overdose in a suicide attempt. She endorses suicidal thoughts with no reported plan and states that she cut her thighs with a pocket knife the other night. She reports multiple suicide attempts by overdose and states  that she cannot keep count. She denies homicidal thoughts. She reports feeling depressed for the past couple weeks. She states that her grandfather passed away on 2025-12-2023, she had a close relationship with him and he was like a father to her and raised her since she was 28 years old. She endorses depressive symptoms of sadness, hopelessness, worthlessness, crying, irritability, guilt, decreased energy and decreased motivation. She also reports periods of feeling like she is manic and punches holes in the wall and have racing thoughts. She reports poor sleep and states that last night was the first time she was able to sleep in months. She reports a decreased appetite. She denies auditory or visual hallucinations. She denies paranoia. She reports smoking marijuana daily from the time she wakes up until the time she goes to bed. She reports drinking alcohol every other night, an unknown amount. She states that she last drank alcohol on Tuesday. She denies alcohol withdrawal symptoms. She denies a history of alcohol withdrawal seizures or delirium tremens. She denies history of substance abuse. She reports outpatient psychiatry and counseling here at the [facility] and states that she has not followed up in a while. She states that she is not taking medications and that she stopped taking medications after she was discharged from the hospital last month. She is unable to recall the name of the medications that she was previously prescribed. Per chart review, patient was prescribed vraylar  1.5 mg po daily at bedtime during hospital stay at [facility].    Total Time spent with patient: 45 minutes  Musculoskeletal  Strength &  Muscle Tone: within normal limits Gait & Station: normal Patient leans: N/A  Psychiatric Specialty Exam  Presentation General Appearance:  Appropriate for Environment  Eye Contact: Fair  Speech: Clear and Coherent  Speech  Volume: Decreased  Handedness: Right   Mood and Affect  Mood: Depressed  Affect: Congruent   Thought Process  Thought Processes: Coherent  Descriptions of Associations:Intact  Orientation:Full (Time, Place and Person)  Thought Content:Logical   Hallucinations:Hallucinations: None  Ideas of Reference:None  Suicidal Thoughts:Suicidal Thoughts: Yes, Active  Homicidal Thoughts:Homicidal Thoughts: No   Sensorium  Memory: Immediate Fair; Recent Poor; Remote Poor  Judgment: Poor  Insight: Poor   Executive Functions  Concentration: Poor  Attention Span: Poor  Recall: Poor  Fund of Knowledge: Fair  Language: Fair   Psychomotor Activity  Psychomotor Activity: Psychomotor Activity: Normal   Assets  Assets: Communication Skills; Desire for Improvement; Housing   Sleep  Sleep: Sleep: Poor   Nutritional Assessment (For OBS and FBC admissions only) Has the patient had a weight loss or gain of 10 pounds or more in the last 3 months?: Yes Has the patient had a decrease in food intake/or appetite?: Yes Does the patient have dental problems?: No Does the patient have eating habits or behaviors that may be indicators of an eating disorder including binging or inducing vomiting?: No Has the patient recently lost weight without trying?: 2.0 Has the patient been eating poorly because of a decreased appetite?: 1 Malnutrition Screening Tool Score: 3 Nutritional Assessment Referrals: Medication/Tx changes    Physical Exam  Cardiovascular:     Rate and Rhythm: Normal rate.  Pulmonary:     Effort: Pulmonary effort is normal.   Musculoskeletal:        General: Normal range of motion.     Cervical back: Normal range of motion.   Neurological:     Mental Status: She is alert and oriented to person, place, and time.    Review of Systems  Constitutional: Negative.   HENT: Negative.    Eyes: Negative.   Respiratory: Negative.     Cardiovascular: Negative.   Gastrointestinal: Negative.   Genitourinary: Negative.   Musculoskeletal: Negative.   Neurological: Negative.   Endo/Heme/Allergies: Negative.     Blood pressure (!) 101/58, pulse 60, temperature 98 F (36.7 C), temperature source Oral, resp. rate 18, unknown if currently breastfeeding. There is no height or weight on file to calculate BMI.  Past Psychiatric History: a past psychiatric history significant for borderline personality disorder, bipolar 1 disorder, self-harm behaviors, depression and suicide attempts by overdose. Per chart review, patient overdosed on Abilify  and carbamazepine  in a suicide attempt Dec 15, 2023. She has past encounters of past suicide attempts by overdose and inpatient psychiatric hospitalizations.  Is the patient at risk to self? Yes  Has the patient been a risk to self in the past 6 months? Yes .    Has the patient been a risk to self within the distant past? Yes   Is the patient a risk to others? Yes   Has the patient been a risk to others in the past 6 months? Yes   Has the patient been a risk to others within the distant past? Yes   Past Medical History: Patient reports a medical history of asthma. She denies taking prescribed medications at this time.  Family History: She reports a family history of father diagnosed with schizophrenia, bipolar, PTSD, alcohol use, and MDD, paternal grandfather diagnosed with MDD, anxiety, bipolar and alcohol use,  paternal grandmother diagnosed with bipolar and a paternal cousin diagnosed with MDD.  Social History: Patient states that she lives alone in an apartment. She is unemployed and is on disability. She reports marijuana use. She reports alcohol use.  Last Labs:  Admission on 01/26/2024  Component Date Value Ref Range Status   POC Amphetamine UR 01/26/2024 None Detected  NONE DETECTED (Cut Off Level 1000 ng/mL) Final   POC Secobarbital (BAR) 01/26/2024 None Detected  NONE DETECTED (Cut  Off Level 300 ng/mL) Final   POC Buprenorphine (BUP) 01/26/2024 None Detected  NONE DETECTED (Cut Off Level 10 ng/mL) Final   POC Oxazepam (BZO) 01/26/2024 None Detected  NONE DETECTED (Cut Off Level 300 ng/mL) Final   POC Cocaine UR 01/26/2024 None Detected  NONE DETECTED (Cut Off Level 300 ng/mL) Final   POC Methamphetamine UR 01/26/2024 None Detected  NONE DETECTED (Cut Off Level 1000 ng/mL) Final   POC Morphine 01/26/2024 None Detected  NONE DETECTED (Cut Off Level 300 ng/mL) Final   POC Methadone UR 01/26/2024 None Detected  NONE DETECTED (Cut Off Level 300 ng/mL) Final   POC Oxycodone  UR 01/26/2024 None Detected  NONE DETECTED (Cut Off Level 100 ng/mL) Final   POC Marijuana UR 01/26/2024 Positive (A)  NONE DETECTED (Cut Off Level 50 ng/mL) Final  Admission on 12/14/2023, Discharged on 12/18/2023  Component Date Value Ref Range Status   Glucose-Capillary 12/14/2023 105 (H)  70 - 99 mg/dL Final   Glucose reference range applies only to samples taken after fasting for at least 8 hours.   Sodium 12/14/2023 138  135 - 145 mmol/L Final   Potassium 12/14/2023 3.1 (L)  3.5 - 5.1 mmol/L Final   Chloride 12/14/2023 97 (L)  98 - 111 mmol/L Final   CO2 12/14/2023 25  22 - 32 mmol/L Final   Glucose, Bld 12/14/2023 94  70 - 99 mg/dL Final   Glucose reference range applies only to samples taken after fasting for at least 8 hours.   BUN 12/14/2023 12  6 - 20 mg/dL Final   Creatinine, Ser 12/14/2023 1.04 (H)  0.44 - 1.00 mg/dL Final   Calcium 86/57/8469 8.9  8.9 - 10.3 mg/dL Final   Total Protein 62/95/2841 8.1  6.5 - 8.1 g/dL Final   Albumin 32/44/0102 4.8  3.5 - 5.0 g/dL Final   AST 72/53/6644 27  15 - 41 U/L Final   ALT 12/14/2023 14  0 - 44 U/L Final   Alkaline Phosphatase 12/14/2023 48  38 - 126 U/L Final   Total Bilirubin 12/14/2023 0.6  0.0 - 1.2 mg/dL Final   GFR, Estimated 12/14/2023 >60  >60 mL/min Final   Comment: (NOTE) Calculated using the CKD-EPI Creatinine Equation (2021)    Anion  gap 12/14/2023 16 (H)  5 - 15 Final   Performed at [facility], [address] [place], [place] [address]   Salicylate Lvl 12/14/2023 <7.0 (L)  7.0 - 30.0 mg/dL Final   Performed at [facility], [address] [place], [place] [address]   Acetaminophen  (Tylenol ), Serum 12/14/2023 <10 (L)  10 - 30 ug/mL Final   Comment: (NOTE) Therapeutic concentrations vary significantly. A range of 10-30 ug/mL  may be an effective concentration for many patients. However, some  are best treated at concentrations outside of this range. Acetaminophen  concentrations >150 ug/mL at 4 hours after ingestion  and >50 ug/mL at 12 hours after ingestion are often associated with  toxic reactions.  Performed at [facility], [address], [place], [place] [address]    Alcohol, Ethyl (B) 12/14/2023 <15  <15 mg/dL Final   Comment: Please note change in reference range. (NOTE) For medical purposes only. Performed at [facility], [address], [place], [place] [address]    Opiates 12/15/2023 NONE DETECTED  NONE DETECTED Final   Cocaine 12/15/2023 NONE DETECTED  NONE DETECTED Final   Benzodiazepines 12/15/2023 POSITIVE (A)  NONE DETECTED Final   Amphetamines 12/15/2023 NONE DETECTED  NONE DETECTED Final   Tetrahydrocannabinol 12/15/2023 POSITIVE (A)  NONE DETECTED Final   Barbiturates 12/15/2023 NONE DETECTED  NONE DETECTED Final   Comment: (NOTE) DRUG SCREEN FOR MEDICAL PURPOSES ONLY.  IF CONFIRMATION IS NEEDED FOR ANY PURPOSE, NOTIFY LAB WITHIN 5 DAYS.  LOWEST DETECTABLE LIMITS FOR URINE DRUG SCREEN Drug Class                     Cutoff (ng/mL) Amphetamine and metabolites    1000 Barbiturate and metabolites    200 Benzodiazepine                 200 Opiates and metabolites        300 Cocaine and metabolites        300 THC                            50 Performed at [facility], [address], [place], [place] [address]    Preg, Serum 12/14/2023 NEGATIVE   NEGATIVE Final   Comment:        THE SENSITIVITY OF THIS METHODOLOGY IS >10 mIU/mL. Performed at [facility], [address] [place], [place] [address]    Magnesium  12/14/2023 1.9  1.7 - 2.4 mg/dL Final   Performed at [facility], [address], [place], [place] [address]   Valproic Acid  Lvl 12/14/2023 <10 (L)  50 - 100 ug/mL Final   Comment: RESULT CONFIRMED BY MANUAL DILUTION Performed at [facility], [address], [place], [place] [address]    Ammonia 12/14/2023 26  9 - 35 umol/L Final   Performed at [facility], [address] [place], [place] [address]   WBC 12/14/2023 6.9  4.0 - 10.5 K/uL Final   RBC 12/14/2023 5.10  3.87 - 5.11 MIL/uL Final   Hemoglobin 12/14/2023 14.1  12.0 - 15.0 g/dL Final   HCT 40/34/7425 43.5  36.0 - 46.0 % Final   MCV 12/14/2023 85.3  80.0 - 100.0 fL Final   MCH 12/14/2023 27.6  26.0 - 34.0 pg Final   MCHC 12/14/2023 32.4  30.0 - 36.0 g/dL Final   RDW 95/63/8756 14.6  11.5 - 15.5 % Final   Platelets 12/14/2023 426 (H)  150 - 400 K/uL Final   nRBC 12/14/2023 0.0  0.0 - 0.2 % Final   Neutrophils Relative % 12/14/2023 61  % Final   Neutro Abs 12/14/2023 4.2  1.7 - 7.7 K/uL Final   Lymphocytes Relative 12/14/2023 26  % Final   Lymphs Abs 12/14/2023 1.8  0.7 - 4.0 K/uL Final   Monocytes Relative 12/14/2023 10  % Final   Monocytes Absolute 12/14/2023 0.7  0.1 - 1.0 K/uL Final   Eosinophils Relative 12/14/2023 2  % Final   Eosinophils Absolute 12/14/2023 0.1  0.0 - 0.5 K/uL Final   Basophils Relative 12/14/2023 1  % Final   Basophils  Absolute 12/14/2023 0.1  0.0 - 0.1 K/uL Final   Immature Granulocytes 12/14/2023 0  % Final   Abs Immature Granulocytes 12/14/2023 0.02  0.00 - 0.07 K/uL Final   Performed at [facility], [address], [place], [place] [address]   Sodium 12/15/2023 137  135 - 145 mmol/L Final   Potassium 12/15/2023 3.8  3.5 - 5.1 mmol/L Final   Chloride 12/15/2023 98  98 - 111 mmol/L Final   CO2  12/15/2023 25  22 - 32 mmol/L Final   Glucose, Bld 12/15/2023 105 (H)  70 - 99 mg/dL Final   Glucose reference range applies only to samples taken after fasting for at least 8 hours.   BUN 12/15/2023 13  6 - 20 mg/dL Final   Creatinine, Ser 12/15/2023 1.04 (H)  0.44 - 1.00 mg/dL Final   Calcium 60/45/4098 9.4  8.9 - 10.3 mg/dL Final   GFR, Estimated 12/15/2023 >60  >60 mL/min Final   Comment: (NOTE) Calculated using the CKD-EPI Creatinine Equation (2021)    Anion gap 12/15/2023 14  5 - 15 Final   Performed at [facility], [address] [place], [place] [address]   Total Protein 12/15/2023 8.3 (H)  6.5 - 8.1 g/dL Final   Albumin 78/29/5621 4.9  3.5 - 5.0 g/dL Final   AST 30/86/5784 27  15 - 41 U/L Final   ALT 12/15/2023 15  0 - 44 U/L Final   Alkaline Phosphatase 12/15/2023 45  38 - 126 U/L Final   Total Bilirubin 12/15/2023 0.8  0.0 - 1.2 mg/dL Final   Bilirubin, Direct 12/15/2023 0.2  0.0 - 0.2 mg/dL Final   Indirect Bilirubin 12/15/2023 0.6  0.3 - 0.9 mg/dL Final   Performed at [facility], [address], [place], [place] [address]   WBC 12/15/2023 11.1 (H)  4.0 - 10.5 K/uL Final   RBC 12/15/2023 4.89  3.87 - 5.11 MIL/uL Final   Hemoglobin 12/15/2023 13.7  12.0 - 15.0 g/dL Final   HCT 52/84/1324 42.4  36.0 - 46.0 % Final   MCV 12/15/2023 86.7  80.0 - 100.0 fL Final   MCH 12/15/2023 28.0  26.0 - 34.0 pg Final   MCHC 12/15/2023 32.3  30.0 - 36.0 g/dL Final   RDW 40/05/2724 14.6  11.5 - 15.5 % Final   Platelets 12/15/2023 427 (H)  150 - 400 K/uL Final   nRBC 12/15/2023 0.2  0.0 - 0.2 % Final   Neutrophils Relative % 12/15/2023 77  % Final   Neutro Abs 12/15/2023 8.6 (H)  1.7 - 7.7 K/uL Final   Lymphocytes Relative 12/15/2023 17  % Final   Lymphs Abs 12/15/2023 1.9  0.7 - 4.0 K/uL Final   Monocytes Relative 12/15/2023 6  % Final   Monocytes Absolute 12/15/2023 0.6  0.1 - 1.0 K/uL Final   Eosinophils Relative 12/15/2023 0  % Final   Eosinophils Absolute 12/15/2023  0.0  0.0 - 0.5 K/uL Final   Basophils Relative 12/15/2023 0  % Final   Basophils Absolute 12/15/2023 0.0  0.0 - 0.1 K/uL Final   Immature Granulocytes 12/15/2023 0  % Final   Abs Immature Granulocytes 12/15/2023 0.04  0.00 - 0.07 K/uL Final   Performed at [facility], [address], [place], [place] [address]   Acetaminophen  (Tylenol ), Serum 12/15/2023 <10 (L)  10 - 30 ug/mL Final   Comment: (NOTE) Therapeutic concentrations vary significantly. A range of 10-30 ug/mL  may be  an effective concentration for many patients. However, some  are best treated at concentrations outside of this range. Acetaminophen  concentrations >150 ug/mL at 4 hours after ingestion  and >50 ug/mL at 12 hours after ingestion are often associated with  toxic reactions.  Performed at [facility], [address], [place], [place] [address]    Salicylate Lvl 12/15/2023 <7.0 (L)  7.0 - 30.0 mg/dL Final   Performed at [facility], [address] [place], [place] [address]   Carbamazepine  Lvl 12/15/2023 <2.0 (L)  4.0 - 12.0 ug/mL Final   Performed at [facility], [address] [place], [place] [address]   WBC 12/16/2023 4.9  4.0 - 10.5 K/uL Final   RBC 12/16/2023 4.47  3.87 - 5.11 MIL/uL Final   Hemoglobin 12/16/2023 12.5  12.0 - 15.0 g/dL Final   HCT 28/41/3244 38.5  36.0 - 46.0 % Final   MCV 12/16/2023 86.1  80.0 - 100.0 fL Final   MCH 12/16/2023 28.0  26.0 - 34.0 pg Final   MCHC 12/16/2023 32.5  30.0 - 36.0 g/dL Final   RDW 08/11/7251 14.7  11.5 - 15.5 % Final   Platelets 12/16/2023 314  150 - 400 K/uL Final   nRBC 12/16/2023 0.0  0.0 - 0.2 % Final   Neutrophils Relative % 12/16/2023 52  % Final   Neutro Abs 12/16/2023 2.5  1.7 - 7.7 K/uL Final   Lymphocytes Relative 12/16/2023 37  % Final   Lymphs Abs 12/16/2023 1.8  0.7 - 4.0 K/uL Final   Monocytes Relative 12/16/2023 8  % Final   Monocytes Absolute 12/16/2023 0.4  0.1 - 1.0 K/uL Final   Eosinophils Relative 12/16/2023 2  %  Final   Eosinophils Absolute 12/16/2023 0.1  0.0 - 0.5 K/uL Final   Basophils Relative 12/16/2023 1  % Final   Basophils Absolute 12/16/2023 0.1  0.0 - 0.1 K/uL Final   Immature Granulocytes 12/16/2023 0  % Final   Abs Immature Granulocytes 12/16/2023 0.01  0.00 - 0.07 K/uL Final   Performed at [facility], [address], [place], [place] [address]   Sodium 12/16/2023 142  135 - 145 mmol/L Final   Potassium 12/16/2023 3.7  3.5 - 5.1 mmol/L Final   Chloride 12/16/2023 102  98 - 111 mmol/L Final   CO2 12/16/2023 29  22 - 32 mmol/L Final   Glucose, Bld 12/16/2023 85  70 - 99 mg/dL Final   Glucose reference range applies only to samples taken after fasting for at least 8 hours.   BUN 12/16/2023 15  6 - 20 mg/dL Final   Creatinine, Ser 12/16/2023 0.89  0.44 - 1.00 mg/dL Final   Calcium 34/74/2595 9.3  8.9 - 10.3 mg/dL Final   Total Protein 63/87/5643 6.6  6.5 - 8.1 g/dL Final   Albumin 32/95/1884 3.9  3.5 - 5.0 g/dL Final   AST 16/60/6301 20  15 - 41 U/L Final   ALT 12/16/2023 14  0 - 44 U/L Final   Alkaline Phosphatase 12/16/2023 40  38 - 126 U/L Final   Total Bilirubin 12/16/2023 1.0  0.0 - 1.2 mg/dL Final   GFR, Estimated 12/16/2023 >60  >60 mL/min Final   Comment: (NOTE) Calculated using the CKD-EPI Creatinine Equation (2021)    Anion gap 12/16/2023 11  5 - 15 Final   Performed at [facility], [address], [place], [place] [address]   Magnesium  12/16/2023 2.1  1.7 -  2.4 mg/dL Final   Performed at [facility], [address], [place], [place] [address]   Phosphorus 12/16/2023 3.1  2.5 - 4.6 mg/dL Final   Performed at [facility], [address] [place], [place] [address]  Admission on 10/30/2023, Discharged on 11/07/2023  Component Date Value Ref Range Status   Preg, Serum 10/30/2023 NEGATIVE  NEGATIVE Final   Comment:        THE SENSITIVITY OF THIS METHODOLOGY IS >10 mIU/mL. Performed at [facility], [address]  [place], [place] [address]    WBC 10/30/2023 8.6  4.0 - 10.5 K/uL Final   RBC 10/30/2023 4.70  3.87 - 5.11 MIL/uL Final   Hemoglobin 10/30/2023 13.1  12.0 - 15.0 g/dL Final   HCT 91/47/8295 40.7  36.0 - 46.0 % Final   MCV 10/30/2023 86.6  80.0 - 100.0 fL Final   MCH 10/30/2023 27.9  26.0 - 34.0 pg Final   MCHC 10/30/2023 32.2  30.0 - 36.0 g/dL Final   RDW 62/13/0865 14.3  11.5 - 15.5 % Final   Platelets 10/30/2023 223  150 - 400 K/uL Final   nRBC 10/30/2023 0.0  0.0 - 0.2 % Final   Neutrophils Relative % 10/30/2023 82  % Final   Neutro Abs 10/30/2023 7.1  1.7 - 7.7 K/uL Final   Lymphocytes Relative 10/30/2023 10  % Final   Lymphs Abs 10/30/2023 0.8  0.7 - 4.0 K/uL Final   Monocytes Relative 10/30/2023 8  % Final   Monocytes Absolute 10/30/2023 0.7  0.1 - 1.0 K/uL Final   Eosinophils Relative 10/30/2023 0  % Final   Eosinophils Absolute 10/30/2023 0.0  0.0 - 0.5 K/uL Final   Basophils Relative 10/30/2023 0  % Final   Basophils Absolute 10/30/2023 0.0  0.0 - 0.1 K/uL Final   Immature Granulocytes 10/30/2023 0  % Final   Abs Immature Granulocytes 10/30/2023 0.03  0.00 - 0.07 K/uL Final   Performed at [facility], [address], [place], [place] [address]   Sodium 10/30/2023 137  135 - 145 mmol/L Final   Potassium 10/30/2023 3.6  3.5 - 5.1 mmol/L Final   Chloride 10/30/2023 106  98 - 111 mmol/L Final   CO2 10/30/2023 20 (L)  22 - 32 mmol/L Final   Glucose, Bld 10/30/2023 76  70 - 99 mg/dL Final   Glucose reference range applies only to samples taken after fasting for at least 8 hours.   BUN 10/30/2023 15  6 - 20 mg/dL Final   Creatinine, Ser 10/30/2023 0.93  0.44 - 1.00 mg/dL Final   Calcium 62/95/2841 10.0  8.9 - 10.3 mg/dL Final   GFR, Estimated 10/30/2023 >60  >60 mL/min Final   Comment: (NOTE) Calculated using the CKD-EPI Creatinine Equation (2021)    Anion gap 10/30/2023 11  5 - 15 Final   Performed at [facility], [address]  [place], [place] [address]  Clinical Support on 08/26/2023  Component Date Value Ref Range Status   Neisseria Gonorrhea 08/26/2023 Negative   Final   Chlamydia 08/26/2023 Negative   Final   Trichomonas 08/26/2023 Negative   Final   Bacterial Vaginitis (gardnerella) 08/26/2023 Positive (A)   Final   Candida Vaginitis 08/26/2023 Negative   Final   Candida Glabrata 08/26/2023 Negative   Final   Comment 08/26/2023 Normal Reference Range Bacterial Vaginosis - Negative   Final   Comment 08/26/2023 Normal Reference Ranger Chlamydia - Negative  Final   Comment 08/26/2023 Normal Reference Range Neisseria Gonorrhea - Negative   Final   Comment 08/26/2023 Normal Reference Range Candida Species - Negative   Final   Comment 08/26/2023 Normal Reference Range Candida Galbrata - Negative   Final   Comment 08/26/2023 Normal Reference Range Trichomonas - Negative   Final    Allergies: Ascorbate, Ascorbic acid, Citrus, Coconut flavoring agent (non-screening), Lamotrigine, Latex, Orange (diagnostic), Peach flavoring agent (non-screening), Pear, Pineapple, Prunus persica, and Tape  Medications:  Facility Ordered Medications  Medication   acetaminophen  (TYLENOL ) tablet 650 mg   alum & mag hydroxide-simeth (MAALOX/MYLANTA) 200-200-20 MG/5ML suspension 30 mL   magnesium  hydroxide (MILK OF MAGNESIA) suspension 30 mL   haloperidol  (HALDOL ) tablet 5 mg   And   diphenhydrAMINE  (BENADRYL ) capsule 50 mg   haloperidol  lactate (HALDOL ) injection 5 mg   And   diphenhydrAMINE  (BENADRYL ) injection 50 mg   And   LORazepam  (ATIVAN ) injection 2 mg   haloperidol  lactate (HALDOL ) injection 10 mg   And   diphenhydrAMINE  (BENADRYL ) injection 50 mg   And   LORazepam  (ATIVAN ) injection 2 mg   hydrOXYzine  (ATARAX ) tablet 25 mg   traZODone  (DESYREL ) tablet 50 mg   PTA Medications  Medication Sig   ARIPiprazole  (ABILIFY ) 5 MG tablet Take 1.5 tablets (7.5 mg total) by mouth daily. (Patient not taking: Reported on 01/26/2024)     [facility] Discharge Disposition for Follow up and Recommendations: Based on my evaluation I certify that psychiatric inpatient services furnished can reasonably be expected to improve the patient's condition which I recommend transfer to an appropriate accepting facility.   Patient is recommended for inpatient psychiatric treatment for mood stabilization and medication management. Patient has been accepted to [facility] on 01/26/2024 Bed assignment: Main campus. Attending Physician will be Lavona Pounds, MD  Lab Orders         CBC with Differential/Platelet         Comprehensive metabolic panel         Hemoglobin A1c         Ethanol         Lipid panel         TSH         POC urine preg, ED         POCT Urine Drug Screen - (I-Screen)    EKG  Medication  Restart vraylar  1.5 mg po at bedtime    Gwenlyn Lento, NP 01/26/24  4:15 PM
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
## 2024-01-26 NOTE — ED Notes (Signed)
 Patient observed/assessed at bedside with patient lying in bed appearing in no immediate distress. Patient is difficult to assess as patient seemingly ignores when being talked to. Continuing to monitor.

## 2024-01-26 NOTE — Progress Notes (Signed)
 Inpatient Psychiatric Referral  Patient was recommended inpatient per Nickola Baron, NP. There are no available beds at [facility], per [facility] Bevin Bucks, RN. Patient was referred to the following out of network facilities:  Destination  Service Provider Request Status Address Phone Fax  [facility]  Pending - Request Sent [address], [place] [address] [phone]  [facility]  Pending - Request Sent [address] [place] [address] [phone]  [facility]  Pending - Request Sent [address] Johnella Naas [place] [address] [phone]  [facility]  Pending - Request Sent [address] [place], [place] [address] [phone]  [facility]  Pending - Request Sent [address] [place] [address] [phone]  [facility]  Pending - Request Sent [address] [place] [address] [phone]  [facility]  Pending - Request Sent [address] Shelva Dice [place] [address] [phone]  [facility]  Pending - Request Sent [address], [place] [address] [phone]  [facility]  Pending - Request Sent [address] Sharren Decree [place] [phone]  [facility]  Pending - Request Sent [address] [place] [address] [phone]  [facility]  Pending - Request Sent [address], [place] [address] [phone]  [facility]  Pending - Request Sent [address] [place] [address] [phone]    Situation ongoing, CSW to continue following and update chart as more information becomes available.   Albertus Alt MSW, LCSWA 01/26/2024  4:27PM

## 2024-01-26 NOTE — Discharge Instructions (Signed)
 Accepted to [facility] on 01/26/2024 Bed assignment: Main campus
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
## 2024-01-26 NOTE — Progress Notes (Signed)
 Patient placed under IVC. Patient refusing to go to [facility]. Per nursing, patient banged her head on the wall. No visible injury noted. Provider explained to the patient that his provider has no say so over placement. Patient argumentative and hostile.

## 2024-01-26 NOTE — Progress Notes (Signed)
 Pt has been accepted to [facility] on 01/26/2024 Bed assignment: Main campus  Pt meets inpatient criteria per  Nickola Baron, NP.   Attending Physician will be Lavona Pounds, MD  Report can be called to: [phone]   Pt can arrive ASAP  Care Team Notified:   Nickola Baron, NP, Rachelle Bue, RN

## 2024-01-26 NOTE — ED Notes (Signed)
 1540: pt arrived to adult obs unit. Patient is AOX4, denies pain or discomfort att. Pt denies SI/HI and AVH. Reports did have SI today but not at this time. States they live alone but parents are supportive financially. Reports coming in today for help due to drinking and EtoH use which results in acting manic and hitting things (hand knuckles with ebrasion). Safety measures initiated.

## 2024-01-27 MED ORDER — LORAZEPAM 1 MG PO TABS
2.0000 mg | ORAL_TABLET | Freq: Once | ORAL | Status: AC
  Administered 2024-01-27: 2 mg via ORAL
  Filled 2024-01-27: qty 2

## 2024-01-27 NOTE — ED Provider Notes (Signed)
 Sharon Ward is a 28 y.o. female patient with a past psychiatric history significant for borderline personality disorder, bipolar 1 disorder, self-harm behaviors, depression and suicide attempts by overdose who presented to the [facility] on 01/26/24 voluntary with complaints of suicidal thoughts, self-harm behaviors by cutting and mood instability. Patient was accepted to [facility] on 01/26/19. Patient refused to go to [facility] and became argumentative, hostile and banged her head on the wall. No visible injury noted. Patient was placed under IVC. On arrival this morning, I could hear the patient from my office screaming and agitated. Patient was administered Ativan  2 mg po once for agitation. Patient later observed by [name], NP., with [name], NP.,  resting and did not appear to be in acute distress. Patient to transfer to [facility] under IVC.

## 2024-01-27 NOTE — ED Notes (Signed)

## 2024-01-27 NOTE — ED Notes (Signed)
 Patient yelling at staff, being verbally aggressive, hitting walls and pulling at plexiglass partition. Patient agreeable to taking PO medication. Plan of care ongoing, no further concerns as of present. Patient expresses no other needs at this time.

## 2024-01-27 NOTE — ED Notes (Signed)
 The patient is quietly lying in the recliner, calm and cooperative. No distress noted. Environment is secured. Plan of care ongoing, no further concerns as of present. Patient expresses no other needs at this time.

## 2024-01-27 NOTE — ED Notes (Signed)
 Patient observed/assessed in bed/chair resting quietly appearing in no distress and verbalizing no complaints at this time. Will continue to monitor.

## 2024-01-27 NOTE — ED Notes (Signed)
 Patient resting with eyes closed. Respirations even and unlabored. No distress noted. Environment secured. Plan of care ongoing, no further concerns as of present.

## 2024-01-30 ENCOUNTER — Encounter (HOSPITAL_COMMUNITY): Payer: Self-pay

## 2024-02-07 ENCOUNTER — Encounter (HOSPITAL_COMMUNITY): Payer: MEDICAID | Admitting: Psychiatry

## 2024-02-15 ENCOUNTER — Ambulatory Visit (HOSPITAL_COMMUNITY)
Admission: EM | Admit: 2024-02-15 | Discharge: 2024-02-17 | Disposition: A | Discharge status: Other Acute Inpatient Hospital | Payer: MEDICAID | Attending: Family Medicine | Admitting: Family Medicine

## 2024-02-15 DIAGNOSIS — F1290 Cannabis use, unspecified, uncomplicated: Secondary | ICD-10-CM | POA: Insufficient documentation

## 2024-02-15 DIAGNOSIS — F603 Borderline personality disorder: Secondary | ICD-10-CM

## 2024-02-15 DIAGNOSIS — F99 Mental disorder, not otherwise specified: Secondary | ICD-10-CM

## 2024-02-15 DIAGNOSIS — J45909 Unspecified asthma, uncomplicated: Secondary | ICD-10-CM | POA: Insufficient documentation

## 2024-02-15 DIAGNOSIS — R45851 Suicidal ideations: Secondary | ICD-10-CM | POA: Insufficient documentation

## 2024-02-15 DIAGNOSIS — F314 Bipolar disorder, current episode depressed, severe, without psychotic features: Secondary | ICD-10-CM | POA: Insufficient documentation

## 2024-02-15 DIAGNOSIS — F4310 Post-traumatic stress disorder, unspecified: Secondary | ICD-10-CM | POA: Insufficient documentation

## 2024-02-15 DIAGNOSIS — F5105 Insomnia due to other mental disorder: Secondary | ICD-10-CM | POA: Insufficient documentation

## 2024-02-15 DIAGNOSIS — Z9151 Personal history of suicidal behavior: Secondary | ICD-10-CM | POA: Insufficient documentation

## 2024-02-15 LAB — CBC WITH DIFFERENTIAL/PLATELET
Abs Immature Granulocytes: 0.01 K/uL (ref 0.00–0.07)
Basophils Absolute: 0 K/uL (ref 0.0–0.1)
Basophils Relative: 1 %
Eosinophils Absolute: 0.2 K/uL (ref 0.0–0.5)
Eosinophils Relative: 3 %
HCT: 35.1 % — ABNORMAL LOW (ref 36.0–46.0)
Hemoglobin: 11.2 g/dL — ABNORMAL LOW (ref 12.0–15.0)
Immature Granulocytes: 0 %
Lymphocytes Relative: 15 %
Lymphs Abs: 1 K/uL (ref 0.7–4.0)
MCH: 27.1 pg (ref 26.0–34.0)
MCHC: 31.9 g/dL (ref 30.0–36.0)
MCV: 85 fL (ref 80.0–100.0)
Monocytes Absolute: 0.7 K/uL (ref 0.1–1.0)
Monocytes Relative: 11 %
Neutro Abs: 4.6 K/uL (ref 1.7–7.7)
Neutrophils Relative %: 70 %
Platelets: 208 K/uL (ref 150–400)
RBC: 4.13 MIL/uL (ref 3.87–5.11)
RDW: 15 % (ref 11.5–15.5)
WBC: 6.5 K/uL (ref 4.0–10.5)
nRBC: 0 % (ref 0.0–0.2)

## 2024-02-15 LAB — COMPREHENSIVE METABOLIC PANEL WITH GFR
ALT: 10 U/L (ref 0–44)
AST: 15 U/L (ref 15–41)
Albumin: 4.4 g/dL (ref 3.5–5.0)
Alkaline Phosphatase: 51 U/L (ref 38–126)
Anion gap: 11 (ref 5–15)
BUN: 9 mg/dL (ref 6–20)
CO2: 23 mmol/L (ref 22–32)
Calcium: 9.3 mg/dL (ref 8.9–10.3)
Chloride: 102 mmol/L (ref 98–111)
Creatinine, Ser: 0.8 mg/dL (ref 0.44–1.00)
GFR, Estimated: >60 mL/min (ref >60)
Glucose, Bld: 93 mg/dL (ref 70–99)
Potassium: 3.5 mmol/L (ref 3.5–5.1)
Sodium: 136 mmol/L (ref 135–145)
Total Bilirubin: 0.6 mg/dL (ref 0.0–1.2)
Total Protein: 7.6 g/dL (ref 6.5–8.1)

## 2024-02-15 LAB — POCT URINE DRUG SCREEN - MANUAL ENTRY (I-SCREEN)
POC Amphetamine UR: NOT DETECTED
POC Buprenorphine (BUP): POSITIVE — AB
POC Cocaine UR: NOT DETECTED
POC Marijuana UR: POSITIVE — AB
POC Methadone UR: NOT DETECTED
POC Methamphetamine UR: NOT DETECTED
POC Morphine: NOT DETECTED
POC Oxazepam (BZO): NOT DETECTED
POC Oxycodone UR: NOT DETECTED
POC Secobarbital (BAR): NOT DETECTED

## 2024-02-15 LAB — LIPID PANEL
Cholesterol: 179 mg/dL (ref 0–200)
HDL: 72 mg/dL (ref >40)
LDL Cholesterol: 92 mg/dL (ref 0–99)
Total CHOL/HDL Ratio: 2.5 ratio
Triglycerides: 77 mg/dL (ref <150)
VLDL: 15 mg/dL (ref 0–40)

## 2024-02-15 LAB — TSH: TSH: 1.797 u[IU]/mL (ref 0.350–4.500)

## 2024-02-15 LAB — URINALYSIS, ROUTINE W REFLEX MICROSCOPIC
Bilirubin Urine: NEGATIVE
Glucose, UA: NEGATIVE mg/dL
Hgb urine dipstick: NEGATIVE
Ketones, ur: NEGATIVE mg/dL
Nitrite: NEGATIVE
Protein, ur: NEGATIVE mg/dL
Specific Gravity, Urine: 1.021 (ref 1.005–1.030)
pH: 5 (ref 5.0–8.0)

## 2024-02-15 LAB — POC URINE PREG, ED: Preg Test, Ur: NEGATIVE

## 2024-02-15 LAB — ETHANOL: Alcohol, Ethyl (B): <15 mg/dL (ref <15)

## 2024-02-15 LAB — POCT PREGNANCY, URINE: Preg Test, Ur: NEGATIVE

## 2024-02-15 MED ORDER — OLANZAPINE 10 MG PO TBDP
10.0000 mg | ORAL_TABLET | ORAL | Status: AC
  Administered 2024-02-15: 10 mg via ORAL
  Filled 2024-02-15: qty 1

## 2024-02-15 MED ORDER — LORAZEPAM 2 MG/ML IJ SOLN
2.0000 mg | Freq: Three times a day (TID) | INTRAMUSCULAR | Status: DC | PRN
  Filled 2024-02-15 (×2): qty 1

## 2024-02-15 MED ORDER — TRAZODONE HCL 100 MG PO TABS
100.0000 mg | ORAL_TABLET | Freq: Every day | ORAL | Status: DC
  Administered 2024-02-15: 100 mg via ORAL
  Filled 2024-02-15: qty 1

## 2024-02-15 MED ORDER — DIPHENHYDRAMINE HCL 50 MG PO CAPS: 50.0000 mg | ORAL_CAPSULE | Freq: Three times a day (TID) | ORAL | Status: DC | PRN

## 2024-02-15 MED ORDER — LORAZEPAM 2 MG/ML IJ SOLN
2.0000 mg | Freq: Three times a day (TID) | INTRAMUSCULAR | Status: DC | PRN
  Administered 2024-02-16: 2 mg via INTRAMUSCULAR

## 2024-02-15 MED ORDER — LORAZEPAM 1 MG PO TABS: 2.0000 mg | ORAL_TABLET | Freq: Once | ORAL | Status: DC

## 2024-02-15 MED ORDER — HALOPERIDOL LACTATE 5 MG/ML IJ SOLN
10.0000 mg | Freq: Three times a day (TID) | INTRAMUSCULAR | Status: DC | PRN
  Administered 2024-02-16: 10 mg via INTRAMUSCULAR
  Filled 2024-02-15: qty 2

## 2024-02-15 MED ORDER — ALUM & MAG HYDROXIDE-SIMETH 200-200-20 MG/5ML PO SUSP: 30.0000 mL | ORAL | Status: DC | PRN

## 2024-02-15 MED ORDER — DIPHENHYDRAMINE HCL 50 MG/ML IJ SOLN
50.0000 mg | Freq: Three times a day (TID) | INTRAMUSCULAR | Status: DC | PRN
  Administered 2024-02-16: 50 mg via INTRAMUSCULAR
  Filled 2024-02-15 (×2): qty 1

## 2024-02-15 MED ORDER — HALOPERIDOL 5 MG PO TABS: 5.0000 mg | ORAL_TABLET | Freq: Three times a day (TID) | ORAL | Status: DC | PRN

## 2024-02-15 MED ORDER — MAGNESIUM HYDROXIDE 400 MG/5ML PO SUSP
30.0000 mL | Freq: Every day | ORAL | Status: DC | PRN
Start: 2024-02-15 — End: 2024-02-17

## 2024-02-15 MED ORDER — ACETAMINOPHEN 325 MG PO TABS: 650.0000 mg | ORAL_TABLET | Freq: Four times a day (QID) | ORAL | Status: DC | PRN

## 2024-02-15 MED ORDER — HALOPERIDOL LACTATE 5 MG/ML IJ SOLN
5.0000 mg | Freq: Three times a day (TID) | INTRAMUSCULAR | Status: DC | PRN
  Filled 2024-02-15: qty 1

## 2024-02-15 MED ORDER — HYDROXYZINE HCL 25 MG PO TABS: 25.0000 mg | ORAL_TABLET | Freq: Three times a day (TID) | ORAL | Status: DC | PRN

## 2024-02-15 MED ORDER — LORAZEPAM 1 MG PO TABS
2.0000 mg | ORAL_TABLET | ORAL | Status: AC
  Administered 2024-02-15: 2 mg via ORAL
  Filled 2024-02-15: qty 2

## 2024-02-15 MED ORDER — DIPHENHYDRAMINE HCL 50 MG/ML IJ SOLN
50.0000 mg | Freq: Three times a day (TID) | INTRAMUSCULAR | Status: DC | PRN
  Filled 2024-02-15: qty 1

## 2024-02-15 NOTE — ED Notes (Signed)
 Pt is asleep at this time, no distress noted. She was calm and cooperative earlier, took her bedtime medication without incident. Staff will continue to monitor for safety.

## 2024-02-15 NOTE — ED Provider Notes (Signed)
 Patient became agitated verbally aggressive towards security and staff when she was asked to put on scrubs.  This writer attempted to de-escalate patient however she became more agitated stating I do not want to go to [facility].  This writer reassured patient that she has been faxed out to other facilities and not [facility].  Nursing was able to de-escalate patient after I placed a restraining order and patient required no agitation medications and took oral medications without incident.  Patient has been placed under IVC due to altered mental status consistent with bipolar disorder with mixed symptoms including mania and depression evidenced by labile mood and aggressive behavior.   Suzen Lesches, NP

## 2024-02-15 NOTE — Progress Notes (Signed)
 LCSW Progress Note  [id number]   Yassmine Tamm  02/15/2024  3:17 PM  Description:   Inpatient Psychiatric Referral  Patient was recommended inpatient per Suzen Lesches NP. There are no available beds at [facility], per [facility] Cherylynn Ernst RN. Patient was referred to the following out of network facilities:    [facility] Provider Address Phone [facility]  [address] [place] [address] [phone]  [facility] Patient [facility]  [facility] [place] [phone]  [facility]  [address] [place] [address] [phone]  [facility]  [address] [place], [place] [address] [phone]  [facility]  [address] [place] [address] [phone]  [facility]  [address] [place], [place] [address] [phone]  [facility]  [address] [place] [address] [phone]  [facility]  [address], [place] [address] [phone]  [facility] EFAX  [address] [place], [place] [phone]  [facility]  [address] [place] [address] [phone]  [facility]  [place] [phone]  [facility]  [address] [place] [address] [phone]      Situation ongoing, CSW to continue following and update chart as more information becomes available.      [place] Kayln Garceau MSW, LCSW  02/15/2024 3:17 PM

## 2024-02-15 NOTE — Progress Notes (Signed)
 Inpatient Psychiatric Referral  Patient was recommended inpatient per Suzen Lesches, NP. There are no available beds at [facility], per [facility]. Patient was referred to the following out of network facilities:  Destination  Service Provider Request Status Address Phone Fax  [facility]  Pending - Request Sent [address] [place] [address] [phone]  [facility]  Pending - Request [facility], [place] [phone]  [facility]  Pending - Request Sent [address] [place] [address] [phone]  [facility]  Pending - Request Sent [address] [place], [place] [address] [phone]  [facility]  Pending - Request Sent [address] [place] [address] [phone]  [facility]  Pending - Request Sent [address] [place], [place] [address] [phone]  [facility]  Pending - Request Sent [address] [place] [address] [phone]  [name]  Pending - Request Sent [address] [place] [address] [phone]  [facility]  Pending - Request Sent [address] [place] [phone]  [facility]  Pending - Request Sent [address] [place] [address] [phone]  [facility]  Pending - Request Sent [place] [phone]  [facility]  Pending - Request Sent [address] [place] [address] [phone]    Situation ongoing, CSW to continue following and update chart as more information becomes available.   Harrie Sofia MSW, LCSWA 02/15/2024  9:07PM
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
## 2024-02-15 NOTE — ED Notes (Signed)
 Pt became agitated when she found out that she would have to wear scrubs instead of her own clothing. Pt began yelling and smacking herself in the head. Pt was eventually verbally deescalated by security and agreeable to take PO prn meds. Notified Harris NP. Pt also then cooperated by putting scrubs on per policy.

## 2024-02-15 NOTE — ED Notes (Signed)
 Pt is resting quietly with eyes closed, respirations even and unlabored, no distress noted at this time. Staff will continue to monitor for safety.

## 2024-02-15 NOTE — ED Notes (Signed)
Pt refused 8pm vitals.

## 2024-02-15 NOTE — ED Provider Notes (Signed)
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   Performed at [facility], [address], [place], [place] [address]  Clinical Support on 08/26/2023  Component Date Value Ref Range Status   Neisseria Gonorrhea 08/26/2023 Negative   Final   Chlamydia 08/26/2023 Negative   Final   Trichomonas 08/26/2023 Negative   Final   Bacterial Vaginitis (gardnerella) 08/26/2023 Positive (A)   Final   Candida Vaginitis 08/26/2023 Negative   Final   Candida Glabrata 08/26/2023 Negative   Final    Comment 08/26/2023 Normal Reference Range Bacterial Vaginosis - Negative   Final   Comment 08/26/2023 Normal Reference Ranger Chlamydia - Negative   Final   Comment 08/26/2023 Normal Reference Range Neisseria Gonorrhea - Negative   Final   Comment 08/26/2023 Normal Reference Range Candida Species - Negative   Final   Comment 08/26/2023 Normal Reference Range Candida Galbrata - Negative   Final   Comment 08/26/2023 Normal Reference Range Trichomonas - Negative   Final    Allergies: Ascorbate, Ascorbic acid, Citrus, Coconut flavoring agent (non-screening), Lamotrigine, Latex, Orange (diagnostic), Peach flavoring agent (non-screening), Pear, Pineapple, Prunus persica, and Tape  Medications:  Facility Ordered Medications  Medication   acetaminophen  (TYLENOL ) tablet 650 mg   alum & mag hydroxide-simeth (MAALOX/MYLANTA) 200-200-20 MG/5ML suspension 30 mL   magnesium  hydroxide (MILK OF MAGNESIA) suspension 30 mL   haloperidol  (HALDOL ) tablet 5 mg   And   diphenhydrAMINE  (BENADRYL ) capsule 50 mg   haloperidol  lactate (HALDOL ) injection 5 mg   And   diphenhydrAMINE  (BENADRYL ) injection 50 mg   And   LORazepam  (ATIVAN ) injection 2 mg   haloperidol  lactate (HALDOL ) injection 10 mg   And   diphenhydrAMINE  (BENADRYL ) injection 50 mg   And   LORazepam  (ATIVAN ) injection 2 mg   hydrOXYzine  (ATARAX ) tablet 25 mg   traZODone  (DESYREL ) tablet 100 mg      Medical Decision Making  Bipolar 1 disorder severe with suicidal ideations and a recent history of an overdose requiring hospitalization patient needs inpatient psychiatric treatment criteria.  Patient has been faxed out to appropriate psychiatric facilities.  - Starting patient on Remeron 15 mg at bedtime to improve sleep, depression and anxiety. -  Scheduled trazodone  100 mg at bedtime to facilitate sleep. -Patient has been without Vraylar  for unspecified amount of time and reports difficulty affording co-pay to get medications.  Will defer to  inpatient treatment facility to start any new medications to manage mood. -Agitation protocol ordered UDS, ethanol level, (to rule out chemical induced theatric symptoms )EKG-measure QTc interval is an antipsychotic medication,  basic labs pending Lipid and A1c ordered as patient has recently been prescribed antipsychotic therapy.    Recommendations  Based on my evaluation the patient does not appear to have an emergency medical condition. Patient meets inpatient psychiatric treatment criteria and has been faxed out to appropriate facilities.  The patient attempts to leave please IVC. Suzen Lesches, NP 02/15/24  3:01 PM
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
## 2024-02-15 NOTE — ED Notes (Signed)
 Pt presented to [facility] initially voluntary, but was IVC'd by provider and admitted to continuous assessment unit w/ c/o suicidal. Hx includes: borderline personality disorder, chronic recurrent suicidal ideations, PTSD, bipolar 1 d/o, medication noncompliance. Pt endorses SI x few weeks, and self-destructive behavior. Denies HI/AVH. Pt initially became verbally aggressive, yelling, and smacking herself in the head when being told that she would have to wear scrubs per policy (see previous RN note for further detail. Pt was deescalated and complied w/ PO prn agitation meds and scrubs. Pt currently calm and cooperative.  Skin assessment: unremarkable. Oriented to unit. Denies need of anything at this time. Pt in NAD at this time. Encouragement and support given. Will continue to monitor.

## 2024-02-16 ENCOUNTER — Other Ambulatory Visit: Payer: Self-pay

## 2024-02-16 LAB — HEMOGLOBIN A1C
Hgb A1c MFr Bld: 5.2 % (ref 4.8–5.6)
Mean Plasma Glucose: 103 mg/dL

## 2024-02-16 MED ORDER — LORAZEPAM 1 MG PO TABS: 2.0000 mg | ORAL_TABLET | Freq: Three times a day (TID) | ORAL | Status: DC | PRN

## 2024-02-16 NOTE — Discharge Instructions (Signed)
 Patient to be transferred to [facility] for inpatient treatment

## 2024-02-16 NOTE — ED Notes (Signed)
 Patient is asleep in her bed att. Chest rise visualized. Safety measures cont.

## 2024-02-16 NOTE — ED Notes (Signed)
 Pt is seen in bed asleep with unlabored and even breaths. She responds to sound. She is seen moving in bed. VS WNL. She is safe on the unit at this time with continuous observation in place. UTA SI/HI/AVH.

## 2024-02-16 NOTE — ED Notes (Signed)
 Pt A&Ox4, angry about disposition and being IVC'd. Unable to assess SI/HI/AVH d/t pt yelling at RN and fixated on IVC and about not getting to go to [facility].

## 2024-02-16 NOTE — ED Notes (Signed)
 Pt currently talking on phone with boyfriend  Labile  at present

## 2024-02-16 NOTE — ED Notes (Signed)
 Pt is now resting in her bed. Chest rise visualized. This is post B10.2

## 2024-02-16 NOTE — ED Notes (Signed)
 Pt sleeping at this time. Rise and fall of chest noted. Pt in NAD at this time. Will continue to monitor.

## 2024-02-16 NOTE — Progress Notes (Signed)
 LCSW Progress Note  [id number]   Sharon Ward  02/16/2024  9:19 AM  Description:   Inpatient Psychiatric Referral  Patient was recommended inpatient per Suzen Lesches NP. There are no available beds at [facility], per [facility] Bretta Qua RN). Patient was referred to the following out of network facilities:    [facility] Provider Address Phone [facility]  [address], [place] [address] [phone]  [facility] Patient Placement  [facility], [place] [phone]  [facility]  [address] [place] [address] [phone]  [facility]  [address] [place], [place] [address] [phone]  [facility]  [address] [place] [address] [phone]  [facility]  [address] [place], [place] [address] [phone]  [facility]  [address] [place] [address] [phone]  [facility]  [address], [place] [address] [phone]  [facility] EFAX  [address] [place], [place] [phone]  [facility]  [address] [place] [address] [phone]  [facility]  [place] [phone]  [facility]  [address] [place] [address] [phone]      Situation ongoing, CSW to continue following and update chart as more information becomes available.      [name], MSW, LCSW  02/16/2024 9:19 AM

## 2024-02-16 NOTE — Progress Notes (Signed)
 Pt has been accepted to [facility] on 02/16/2024 Bed assignment: DELTA UNIT   Pt meets inpatient criteria per: Suzen Lesches NP  Attending Physician will be: Megan Oxentine  MD   Report can be called to:[phone]   Pt can arrive  ASAP   Care Team Notified: Alan Mcardle NP, Damien Fireman RN   [place] Jacqeline Broers LCSW-A   02/16/2024 11:26 AM

## 2024-02-16 NOTE — ED Notes (Signed)
 Patient asking about placement time. Discussed with patient that sheriff called and stated they had not forgot but they were not aware of time yet. Patient calm and cooperative. Safety measures cont.
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
## 2024-02-16 NOTE — ED Notes (Signed)
 Pt is asleep in recliner. No distress noted. Respirations even and unlabored. Staff will continue to monitor for safety.

## 2024-02-16 NOTE — ED Provider Notes (Signed)
 Behavioral Health Discharge Summary   Date and Time: 02/16/2024 9:50 PM Name: Sharon Ward MRN:  [id number]  Subjective:  Sharon Ward Ply 28 y.o., female patient presented to [facility] with complaints of suicidal ideations. Sharon Ward, is seen face to face by this provider, consulted with Dr. Leigh; and chart reviewed on 02/16/24.  She has a past psychiatric history of borderline personality disorder, recurrent suicidal ideation, PTSD and bipolar 1 disorder.  She was recently discharged from [facility] few weeks ago.  Medical history significant for asthma and prolonged QT intervals.  Patient was assessed and placed under involuntary commitment yesterday.    On evaluation Sharon Ward remains irritable and upset about being under IVC and being placed at [facility] for her inpatient treatment.  Patient is explained why she was placed under IVC, however is focused on getting placed at [facility] for inpatient.  Attempted to explain to patient that placement is out of her control however she is still angry and demanding to go to [facility].  Unable to fully assess patient due to her fixation on disposition and refusing to answer assessment questions.  Patient is observed throughout the day with labile mood.   Per charting patient did sleep well last night and was observed napping throughout the day as well.  She did except her medications yesterday and has not required any as needed agitation medications today.  Patient is aware that she has been accepted to [facility] and will be transported via [place] tomorrow around 10 AM.  During evaluation Sharon Ward is sitting up in bed, in no acute distress.  She is alert & oriented x 4 but distracted and uncooperative for this assessment.  Her mood is irritable with congruent affect.  She has normal speech, and agitated behavior.  Objectively there is no evidence of psychosis/mania or delusional thinking. Pt does not appear to be  responding to internal or external stimuli.  Unable to assess for suicidal/self-harm/homicidal ideation, psychosis, and paranoia due to her agitation.  She was offered p.o. agitation meds but declined.  Patient remains under involuntary commitment.  1810- Patient requested to see provider to discuss treatment plan and disposition.  This provider attempted to explain the reason for IVC and that she has been accepted at [facility] for inpatient treatment.  Patient then makes claims that we are doing illegal things and forcing her to go inpatient to a place that she did not want to go as she demands to receive inpatient treatment at [facility].  Attempted to discuss with patient that she reported having suicidal ideations when she could, and with her history of suicide attempts inpatient hospitalization would be necessary to keep her safe.  Patient begins yelling demanding to be discharged and for this provider to call her boyfriend.  She also demanded to be removed from the IVC order patient went out of began yelling kicked all the trash cans in the milieu, started hitting the nurses station window, yelling profanities and calling staff names.  Multiple attempts were made to verbally de-escalate patient however patient remains uncooperative and agitated.  P.o. meds were offered but refused.  When nursing staff attempted to give patient IM medications she began threatening staff and security.  She then began hitting and kicking staff.  Manual hold was used to administer IM agitation medications.  Face-to-face was completed no injuries to staff or patient.  Diagnosis:  Final diagnoses:  Bipolar 1 disorder, depressed, severe (HCC)  Borderline personality  disorder (HCC)  Suicidal thoughts  Insomnia due to other mental disorder    Total Time spent with patient: 20 minutes  Past Psychiatric History: BPD, PTSD, bipolar disorder, suicide attempt and recurrent suicidal ideations.  Last inpatient hospitalization was  June 2025 at [facility]. 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   Nicotine  use disorder 10/20/2023   Prolonged QT interval 11/02/2020   PTSD (post-traumatic stress disorder) 10/24/2016   Pyelonephritis 06/27/2023   Relationship dysfunction    Suicide attempt (HCC) 11/02/2020   Toxic metabolic encephalopathy 04/05/2023    Family History:  Family History  Problem Relation Age of Onset   Asthma Mother    Diabetes Mother    Healthy Mother    Hypertension Father    Asthma Father    Diabetes Father    Healthy Father    Asthma Brother    Hypertension Paternal Uncle    Diabetes Paternal Grandmother    Stroke Paternal Grandfather    Heart disease Paternal Grandfather    Hypertension Paternal Grandfather    Diabetes Paternal Grandfather   , Family Psychiatric  History: None reported Social History: Patient currently is unemployed and living alone.  UDS positive for buprenorphine and THC.  Additional Social History:    Pain Medications: None Prescriptions: See MAR Over the Counter: melatonin History of alcohol / drug use?: Yes Longest period of sobriety (when/how long): 3 weeks Negative Consequences of Use:  (N/A) Withdrawal Symptoms: None Name of Substance 1: Marijuana 1 - Age of First Use: UTA 1 - Amount (size/oz): UTA 1 - Frequency: UTA 1 - Duration: ongoing 1 - Last Use / Amount: unkonwn 1 - Method of Aquiring:  purchse 1- Route of Use: smoke Name of Substance 2: Alcohol 2 - Age of First Use: UTA 2 - Amount (size/oz): unknown 2 - Frequency: UTA 2 - Duration: ongoing 2 - Last Use / Amount: 3 weeks ago/unknon amount 2 - Method of Aquiring: purchase 2 - Route of Substance Use: oral                Sleep: Good  Appetite:  Good  Current Medications:  Current Facility-Administered Medications  Medication Dose Route Frequency Provider Last Rate Last Admin   acetaminophen  (TYLENOL ) tablet 650 mg  650 mg Oral Q6H PRN Arloa Suzen RAMAN, NP       alum & mag hydroxide-simeth (MAALOX/MYLANTA) 200-200-20 MG/5ML suspension 30 mL  30 mL Oral Q4H PRN Arloa Suzen RAMAN, NP       haloperidol  (HALDOL ) tablet 5 mg  5 mg Oral TID PRN Arloa Suzen RAMAN, NP       And   diphenhydrAMINE  (BENADRYL ) capsule 50 mg  50 mg Oral TID PRN Arloa Suzen RAMAN, NP       haloperidol  lactate (HALDOL ) injection 5 mg  5 mg Intramuscular TID PRN Arloa Suzen RAMAN, NP       And   diphenhydrAMINE  (BENADRYL ) injection 50 mg  50 mg Intramuscular TID PRN Arloa Suzen RAMAN, NP   50 mg at 02/16/24 8177   And   LORazepam  (ATIVAN ) injection 2 mg  2 mg Intramuscular TID PRN Arloa Suzen RAMAN, NP       haloperidol  lactate (HALDOL ) injection 10 mg  10 mg Intramuscular TID PRN Arloa Suzen RAMAN, NP   10 mg at 02/16/24 8177   And   diphenhydrAMINE  (BENADRYL ) injection 50 mg  50 mg Intramuscular TID PRN Arloa Suzen RAMAN, NP       And   LORazepam  (ATIVAN ) injection 2 mg  2 mg Intramuscular TID PRN Arloa Suzen RAMAN, NP   2 mg at 02/16/24 1827   hydrOXYzine  (ATARAX ) tablet 25 mg  25 mg Oral TID PRN Arloa Suzen RAMAN, NP       LORazepam  (ATIVAN ) tablet 2 mg  2 mg Oral Once Arloa Suzen RAMAN, NP       LORazepam  (ATIVAN ) tablet 2 mg  2 mg Oral TID PRN Zyree Traynham C, NP       magnesium  hydroxide (MILK OF MAGNESIA) suspension 30 mL  30 mL Oral Daily PRN Arloa Suzen RAMAN, NP       traZODone  (DESYREL ) tablet 100 mg  100 mg Oral  QHS Arloa Suzen RAMAN, NP   100 mg at 02/15/24 2253   No current outpatient medications on file.    Labs  Lab Results:  Admission on 02/15/2024  Component Date Value Ref Range Status   WBC 02/15/2024 6.5  4.0 - 10.5 K/uL Final   RBC 02/15/2024 4.13  3.87 - 5.11 MIL/uL Final   Hemoglobin 02/15/2024 11.2 (L)  12.0 - 15.0 g/dL Final   HCT 92/90/7974 35.1 (L)  36.0 - 46.0 % Final   MCV 02/15/2024 85.0  80.0 - 100.0 fL Final   MCH 02/15/2024 27.1  26.0 - 34.0 pg Final   MCHC 02/15/2024 31.9  30.0 - 36.0 g/dL Final   RDW 92/90/7974 15.0  11.5 - 15.5 % Final   Platelets 02/15/2024 208  150 - 400 K/uL Final   nRBC 02/15/2024 0.0  0.0 - 0.2 % Final   Neutrophils Relative % 02/15/2024 70  % Final   Neutro Abs 02/15/2024 4.6  1.7 - 7.7 K/uL Final   Lymphocytes Relative 02/15/2024 15  % Final   Lymphs Abs 02/15/2024 1.0  0.7 - 4.0 K/uL Final   Monocytes Relative 02/15/2024 11  % Final   Monocytes Absolute 02/15/2024 0.7  0.1 - 1.0 K/uL Final   Eosinophils Relative 02/15/2024 3  % Final   Eosinophils Absolute 02/15/2024 0.2  0.0 - 0.5 K/uL Final   Basophils Relative 02/15/2024 1  % Final   Basophils Absolute 02/15/2024 0.0  0.0 - 0.1 K/uL Final   Immature Granulocytes 02/15/2024 0  % Final   Abs Immature Granulocytes 02/15/2024 0.01  0.00 - 0.07 K/uL Final   Performed at [facility], [address], [place], [place] [address]   Sodium 02/15/2024 136  135 - 145 mmol/L Final   Potassium 02/15/2024 3.5  3.5 - 5.1 mmol/L Final   Chloride 02/15/2024 102  98 - 111 mmol/L Final   CO2 02/15/2024 23  22 - 32 mmol/L Final   Glucose, Bld 02/15/2024 93  70 - 99 mg/dL Final   Glucose reference range applies only to samples taken after fasting for at least 8 hours.   BUN 02/15/2024 9  6 - 20 mg/dL Final   Creatinine, Ser 02/15/2024 0.80  0.44 -  1.00 mg/dL Final   Calcium 92/90/7974 9.3  8.9 - 10.3 mg/dL Final   Total Protein 92/90/7974 7.6  6.5 - 8.1 g/dL Final   Albumin 92/90/7974 4.4  3.5 -  5.0 g/dL Final   AST 92/90/7974 15  15 - 41 U/L Final   ALT 02/15/2024 10  0 - 44 U/L Final   Alkaline Phosphatase 02/15/2024 51  38 - 126 U/L Final   Total Bilirubin 02/15/2024 0.6  0.0 - 1.2 mg/dL Final   GFR, Estimated 02/15/2024 >60  >60 mL/min Final   Comment: (NOTE) Calculated using the CKD-EPI Creatinine Equation (2021)    Anion gap 02/15/2024 11  5 - 15 Final   Performed at [facility], [address], [place], [place] [address]   Hgb A1c MFr Bld 02/15/2024 5.2  4.8 - 5.6 % Final   Comment: (NOTE)         Prediabetes: 5.7 - 6.4         Diabetes: >6.4         Glycemic control for adults with diabetes: <7.0    Mean Plasma Glucose 02/15/2024 103  mg/dL Final   Comment: (NOTE) Performed At: [facility] [address] [place], [place] [id number] Jennette Shorter MD Ey:[id number]    Alcohol, Ethyl (B) 02/15/2024 <15  <15 mg/dL Final   Comment: (NOTE) For medical purposes only. Performed at [facility], [address] [place], [place] [address]    Cholesterol 02/15/2024 179  0 - 200 mg/dL Final   Triglycerides 92/90/7974 77  <150 mg/dL Final   HDL 92/90/7974 72  >40 mg/dL Final   Total CHOL/HDL Ratio 02/15/2024 2.5  RATIO Final   VLDL 02/15/2024 15  0 - 40 mg/dL Final   LDL Cholesterol 02/15/2024 92  0 - 99 mg/dL Final   Comment:        Total Cholesterol/HDL:CHD Risk Coronary Heart Disease Risk Table                     Men   Women  1/2 Average Risk   3.4   3.3  Average Risk       5.0   4.4  2 X Average Risk   9.6   7.1  3 X Average Risk  23.4   11.0        Use the calculated Patient Ratio above and the CHD Risk Table to determine the patient's CHD Risk.        ATP III CLASSIFICATION (LDL):  <100     mg/dL   Optimal  899-870  mg/dL   Near or Above                    Optimal  130-159  mg/dL   Borderline  839-810  mg/dL   High  >809     mg/dL   Very High Performed at [facility], [address] [place], [place] [address]    TSH  02/15/2024 1.797  0.350 - 4.500 uIU/mL Final   Comment: Performed by a 3rd Generation assay with a functional sensitivity of <=0.01 uIU/mL. Performed at [facility], [address] [place], [place] [address]    POC Amphetamine UR 02/15/2024 None Detected  NONE DETECTED (Cut Off Level 1000 ng/mL) Final   POC Secobarbital (BAR) 02/15/2024 None Detected  NONE DETECTED (Cut Off Level 300 ng/mL) Final   POC Buprenorphine (BUP) 02/15/2024 Positive (A)  NONE DETECTED (Cut  Off Level 10 ng/mL) Final   POC Oxazepam (BZO) 02/15/2024 None Detected  NONE DETECTED (Cut Off Level 300 ng/mL) Final   POC Cocaine UR 02/15/2024 None Detected  NONE DETECTED (Cut Off Level 300 ng/mL) Final   POC Methamphetamine UR 02/15/2024 None Detected  NONE DETECTED (Cut Off Level 1000 ng/mL) Final   POC Morphine 02/15/2024 None Detected  NONE DETECTED (Cut Off Level 300 ng/mL) Final   POC Methadone UR 02/15/2024 None Detected  NONE DETECTED (Cut Off Level 300 ng/mL) Final   POC Oxycodone  UR 02/15/2024 None Detected  NONE DETECTED (Cut Off Level 100 ng/mL) Final   POC Marijuana UR 02/15/2024 Positive (A)  NONE DETECTED (Cut Off Level 50 ng/mL) Final   Color, Urine 02/15/2024 YELLOW  YELLOW Final   APPearance 02/15/2024 HAZY (A)  CLEAR Final   Specific Gravity, Urine 02/15/2024 1.021  1.005 - 1.030 Final   pH 02/15/2024 5.0  5.0 - 8.0 Final   Glucose, UA 02/15/2024 NEGATIVE  NEGATIVE mg/dL Final   Hgb urine dipstick 02/15/2024 NEGATIVE  NEGATIVE Final   Bilirubin Urine 02/15/2024 NEGATIVE  NEGATIVE Final   Ketones, ur 02/15/2024 NEGATIVE  NEGATIVE mg/dL Final   Protein, ur 92/90/7974 NEGATIVE  NEGATIVE mg/dL Final   Nitrite 92/90/7974 NEGATIVE  NEGATIVE Final   Leukocytes,Ua 02/15/2024 TRACE (A)  NEGATIVE Final   RBC / HPF 02/15/2024 0-5  0 - 5 RBC/hpf Final   WBC, UA 02/15/2024 0-5  0 - 5 WBC/hpf Final   Bacteria, UA 02/15/2024 RARE (A)  NONE SEEN Final   Squamous Epithelial / HPF 02/15/2024 6-10  0 - 5 /HPF Final    Mucus 02/15/2024 PRESENT   Final   Performed at [facility], [address], [place], [place] [address]   Preg Test, Ur 02/15/2024 Negative  Negative Final   Preg Test, Ur 02/15/2024 NEGATIVE  NEGATIVE Final   Comment:        THE SENSITIVITY OF THIS METHODOLOGY IS >20 mIU/mL.   Admission on 01/26/2024, Discharged on 01/27/2024  Component Date Value Ref Range Status   WBC 01/26/2024 5.1  4.0 - 10.5 K/uL Final   RBC 01/26/2024 4.17  3.87 - 5.11 MIL/uL Final   Hemoglobin 01/26/2024 11.4 (L)  12.0 - 15.0 g/dL Final   HCT 93/80/7974 35.4 (L)  36.0 - 46.0 % Final   MCV 01/26/2024 84.9  80.0 - 100.0 fL Final   MCH 01/26/2024 27.3  26.0 - 34.0 pg Final   MCHC 01/26/2024 32.2  30.0 - 36.0 g/dL Final   RDW 93/80/7974 14.7  11.5 - 15.5 % Final   Platelets 01/26/2024 226  150 - 400 K/uL Final   nRBC 01/26/2024 0.0  0.0 - 0.2 % Final   Neutrophils Relative % 01/26/2024 68  % Final   Neutro Abs 01/26/2024 3.5  1.7 - 7.7 K/uL Final   Lymphocytes Relative 01/26/2024 24  % Final   Lymphs Abs 01/26/2024 1.2  0.7 - 4.0 K/uL Final   Monocytes Relative 01/26/2024 7  % Final   Monocytes Absolute 01/26/2024 0.3  0.1 - 1.0 K/uL Final   Eosinophils Relative 01/26/2024 0  % Final   Eosinophils Absolute 01/26/2024 0.0  0.0 - 0.5 K/uL Final   Basophils Relative 01/26/2024 1  % Final   Basophils Absolute 01/26/2024 0.0  0.0 - 0.1 K/uL Final   Immature Granulocytes 01/26/2024 0  % Final   Abs Immature Granulocytes 01/26/2024 0.01  0.00 - 0.07 K/uL Final   Performed at  [facility], [address] [place], [place] [address]   Sodium 01/26/2024 138  135 - 145 mmol/L Final   Potassium 01/26/2024 3.5  3.5 - 5.1 mmol/L Final   Chloride 01/26/2024 105  98 - 111 mmol/L Final   CO2 01/26/2024 23  22 - 32 mmol/L Final   Glucose, Bld 01/26/2024 98  70 - 99 mg/dL Final   Glucose reference range applies only to samples taken after fasting for at least 8 hours.   BUN 01/26/2024 10  6 - 20 mg/dL Final    Creatinine, Ser 01/26/2024 0.88  0.44 - 1.00 mg/dL Final   Calcium 93/80/7974 9.6  8.9 - 10.3 mg/dL Final   Total Protein 93/80/7974 7.6  6.5 - 8.1 g/dL Final   Albumin 93/80/7974 4.5  3.5 - 5.0 g/dL Final   AST 93/80/7974 15  15 - 41 U/L Final   ALT 01/26/2024 10  0 - 44 U/L Final   Alkaline Phosphatase 01/26/2024 40  38 - 126 U/L Final   Total Bilirubin 01/26/2024 0.8  0.0 - 1.2 mg/dL Final   GFR, Estimated 01/26/2024 >60  >60 mL/min Final   Comment: (NOTE) Calculated using the CKD-EPI Creatinine Equation (2021)    Anion gap 01/26/2024 10  5 - 15 Final   Performed at [facility], [address] [place], [place] [address]   Hgb A1c MFr Bld 01/26/2024 4.8  4.8 - 5.6 % Final   Comment: (NOTE) Diagnosis of Diabetes The following HbA1c ranges recommended by the American Diabetes Association (ADA) may be used as an aid in the diagnosis of diabetes mellitus.  Hemoglobin             Suggested A1C NGSP%              Diagnosis  <5.7                   Non Diabetic  5.7-6.4                Pre-Diabetic  >6.4                   Diabetic  <7.0                   Glycemic control for                       adults with diabetes.     Mean Plasma Glucose 01/26/2024 91.06  mg/dL Final   Performed at [facility], [address], [place], [place] [address]   Alcohol, Ethyl (B) 01/26/2024 <15  <15 mg/dL Final   Comment: (NOTE) For medical purposes only. Performed at [facility], [address] [place], [place] [address]    Cholesterol 01/26/2024 172  0 - 200 mg/dL Final   Triglycerides 93/80/7974 43  <150 mg/dL Final   HDL 93/80/7974 74  >40 mg/dL Final   Total CHOL/HDL Ratio 01/26/2024 2.3  RATIO Final   VLDL 01/26/2024 9  0 - 40 mg/dL Final   LDL Cholesterol 01/26/2024 89  0 - 99 mg/dL Final   Comment:        Total Cholesterol/HDL:CHD Risk Coronary Heart Disease Risk Table                     Men   Women  1/2 Average Risk   3.4   3.3  Average Risk  5.0    4.4  2 X Average Risk   9.6   7.1  3 X Average Risk  23.4   11.0        Use the calculated Patient Ratio above and the CHD Risk Table to determine the patient's CHD Risk.        ATP III CLASSIFICATION (LDL):  <100     mg/dL   Optimal  899-870  mg/dL   Near or Above                    Optimal  130-159  mg/dL   Borderline  839-810  mg/dL   High  >809     mg/dL   Very High Performed at [facility], [address], [place], [place] [address]    TSH 01/26/2024 0.889  0.350 - 4.500 uIU/mL Final   Comment: Performed by a 3rd Generation assay with a functional sensitivity of <=0.01 uIU/mL. Performed at [facility], [address] [place], [place] [address]    POC Amphetamine UR 01/26/2024 None Detected  NONE DETECTED (Cut Off Level 1000 ng/mL) Final   POC Secobarbital (BAR) 01/26/2024 None Detected  NONE DETECTED (Cut Off Level 300 ng/mL) Final   POC Buprenorphine (BUP) 01/26/2024 None Detected  NONE DETECTED (Cut Off Level 10 ng/mL) Final   POC Oxazepam (BZO) 01/26/2024 None Detected  NONE DETECTED (Cut Off Level 300 ng/mL) Final   POC Cocaine UR 01/26/2024 None Detected  NONE DETECTED (Cut Off Level 300 ng/mL) Final   POC Methamphetamine UR 01/26/2024 None Detected  NONE DETECTED (Cut Off Level 1000 ng/mL) Final   POC Morphine 01/26/2024 None Detected  NONE DETECTED (Cut Off Level 300 ng/mL) Final   POC Methadone UR 01/26/2024 None Detected  NONE DETECTED (Cut Off Level 300 ng/mL) Final   POC Oxycodone  UR 01/26/2024 None Detected  NONE DETECTED (Cut Off Level 100 ng/mL) Final   POC Marijuana UR 01/26/2024 Positive (A)  NONE DETECTED (Cut Off Level 50 ng/mL) Final   Preg Test, Ur 01/26/2024 NEGATIVE  NEGATIVE Final   Comment:        THE SENSITIVITY OF THIS METHODOLOGY IS >24 mIU/mL   Admission on 12/14/2023, Discharged on 12/18/2023  Component Date Value Ref Range Status   Glucose-Capillary 12/14/2023 105 (H)  70 - 99 mg/dL Final   Glucose reference range applies only  to samples taken after fasting for at least 8 hours.   Sodium 12/14/2023 138  135 - 145 mmol/L Final   Potassium 12/14/2023 3.1 (L)  3.5 - 5.1 mmol/L Final   Chloride 12/14/2023 97 (L)  98 - 111 mmol/L Final   CO2 12/14/2023 25  22 - 32 mmol/L Final   Glucose, Bld 12/14/2023 94  70 - 99 mg/dL Final   Glucose reference range applies only to samples taken after fasting for at least 8 hours.   BUN 12/14/2023 12  6 - 20 mg/dL Final   Creatinine, Ser 12/14/2023 1.04 (H)  0.44 - 1.00 mg/dL Final   Calcium 94/92/7974 8.9  8.9 - 10.3 mg/dL Final   Total Protein 94/92/7974 8.1  6.5 - 8.1 g/dL Final   Albumin 94/92/7974 4.8  3.5 - 5.0 g/dL Final   AST 94/92/7974 27  15 - 41 U/L Final   ALT 12/14/2023 14  0 - 44 U/L Final   Alkaline Phosphatase 12/14/2023 48  38 - 126 U/L Final   Total Bilirubin 12/14/2023 0.6  0.0 - 1.2 mg/dL  Final   GFR, Estimated 12/14/2023 >60  >60 mL/min Final   Comment: (NOTE) Calculated using the CKD-EPI Creatinine Equation (2021)    Anion gap 12/14/2023 16 (H)  5 - 15 Final   Performed at [facility], [address] [place], [place] [address]   Salicylate Lvl 12/14/2023 <7.0 (L)  7.0 - 30.0 mg/dL Final   Performed at [facility], [address], [place], [place] [address]   Acetaminophen  (Tylenol ), Serum 12/14/2023 <10 (L)  10 - 30 ug/mL Final   Comment: (NOTE) Therapeutic concentrations vary significantly. A range of 10-30 ug/mL  may be an effective concentration for many patients. However, some  are best treated at concentrations outside of this range. Acetaminophen  concentrations >150 ug/mL at 4 hours after ingestion  and >50 ug/mL at 12 hours after ingestion are often associated with  toxic reactions.  Performed at [facility], [address], [place], [place] [address]    Alcohol, Ethyl (B) 12/14/2023 <15  <15 mg/dL Final   Comment: Please note change in reference range. (NOTE) For medical purposes only. Performed at [facility]  Lab, [address] [place], [place] [address]    Opiates 12/15/2023 NONE DETECTED  NONE DETECTED Final   Cocaine 12/15/2023 NONE DETECTED  NONE DETECTED Final   Benzodiazepines 12/15/2023 POSITIVE (A)  NONE DETECTED Final   Amphetamines 12/15/2023 NONE DETECTED  NONE DETECTED Final   Tetrahydrocannabinol 12/15/2023 POSITIVE (A)  NONE DETECTED Final   Barbiturates 12/15/2023 NONE DETECTED  NONE DETECTED Final   Comment: (NOTE) DRUG SCREEN FOR MEDICAL PURPOSES ONLY.  IF CONFIRMATION IS NEEDED FOR ANY PURPOSE, NOTIFY LAB WITHIN 5 DAYS.  LOWEST DETECTABLE LIMITS FOR URINE DRUG SCREEN Drug Class                     Cutoff (ng/mL) Amphetamine and metabolites    1000 Barbiturate and metabolites    200 Benzodiazepine                 200 Opiates and metabolites        300 Cocaine and metabolites        300 THC                            50 Performed at [facility], [address], [place], [place] [address]    Preg, Serum 12/14/2023 NEGATIVE  NEGATIVE Final   Comment:        THE SENSITIVITY OF THIS METHODOLOGY IS >10 mIU/mL. Performed at [facility], [address] [place], [place] [address]    Magnesium  12/14/2023 1.9  1.7 - 2.4 mg/dL Final   Performed at [facility], [address], [place], [place] [address]   Valproic Acid  Lvl 12/14/2023 <10 (L)  50 - 100 ug/mL Final   Comment: RESULT CONFIRMED BY MANUAL DILUTION Performed at [facility], [address] [place], [place] [address]    Ammonia 12/14/2023 26  9 - 35 umol/L Final   Performed at [facility], [address] [place], [place] [address]   WBC 12/14/2023 6.9  4.0 - 10.5 K/uL Final   RBC 12/14/2023 5.10  3.87 - 5.11 MIL/uL Final   Hemoglobin 12/14/2023 14.1  12.0 - 15.0 g/dL Final   HCT 94/92/7974 43.5  36.0 - 46.0 % Final   MCV 12/14/2023 85.3  80.0 - 100.0  fL Final   MCH 12/14/2023 27.6  26.0 - 34.0 pg Final   MCHC 12/14/2023 32.4  30.0 - 36.0 g/dL Final   RDW 94/92/7974 14.6  11.5  - 15.5 % Final   Platelets 12/14/2023 426 (H)  150 - 400 K/uL Final   nRBC 12/14/2023 0.0  0.0 - 0.2 % Final   Neutrophils Relative % 12/14/2023 61  % Final   Neutro Abs 12/14/2023 4.2  1.7 - 7.7 K/uL Final   Lymphocytes Relative 12/14/2023 26  % Final   Lymphs Abs 12/14/2023 1.8  0.7 - 4.0 K/uL Final   Monocytes Relative 12/14/2023 10  % Final   Monocytes Absolute 12/14/2023 0.7  0.1 - 1.0 K/uL Final   Eosinophils Relative 12/14/2023 2  % Final   Eosinophils Absolute 12/14/2023 0.1  0.0 - 0.5 K/uL Final   Basophils Relative 12/14/2023 1  % Final   Basophils Absolute 12/14/2023 0.1  0.0 - 0.1 K/uL Final   Immature Granulocytes 12/14/2023 0  % Final   Abs Immature Granulocytes 12/14/2023 0.02  0.00 - 0.07 K/uL Final   Performed at [facility], [address] [place], [place] [address]   Sodium 12/15/2023 137  135 - 145 mmol/L Final   Potassium 12/15/2023 3.8  3.5 - 5.1 mmol/L Final   Chloride 12/15/2023 98  98 - 111 mmol/L Final   CO2 12/15/2023 25  22 - 32 mmol/L Final   Glucose, Bld 12/15/2023 105 (H)  70 - 99 mg/dL Final   Glucose reference range applies only to samples taken after fasting for at least 8 hours.   BUN 12/15/2023 13  6 - 20 mg/dL Final   Creatinine, Ser 12/15/2023 1.04 (H)  0.44 - 1.00 mg/dL Final   Calcium 94/91/7974 9.4  8.9 - 10.3 mg/dL Final   GFR, Estimated 12/15/2023 >60  >60 mL/min Final   Comment: (NOTE) Calculated using the CKD-EPI Creatinine Equation (2021)    Anion gap 12/15/2023 14  5 - 15 Final   Performed at [facility], [address] [place], [place] [address]   Total Protein 12/15/2023 8.3 (H)  6.5 - 8.1 g/dL Final   Albumin 94/91/7974 4.9  3.5 - 5.0 g/dL Final   AST 94/91/7974 27  15 - 41 U/L Final   ALT 12/15/2023 15  0 - 44 U/L Final   Alkaline Phosphatase 12/15/2023 45  38 - 126 U/L Final   Total Bilirubin 12/15/2023 0.8  0.0 - 1.2 mg/dL Final   Bilirubin, Direct 12/15/2023 0.2  0.0 - 0.2 mg/dL Final   Indirect Bilirubin  12/15/2023 0.6  0.3 - 0.9 mg/dL Final   Performed at [facility], [address], [place], [place] [address]   WBC 12/15/2023 11.1 (H)  4.0 - 10.5 K/uL Final   RBC 12/15/2023 4.89  3.87 - 5.11 MIL/uL Final   Hemoglobin 12/15/2023 13.7  12.0 - 15.0 g/dL Final   HCT 94/91/7974 42.4  36.0 - 46.0 % Final   MCV 12/15/2023 86.7  80.0 - 100.0 fL Final   MCH 12/15/2023 28.0  26.0 - 34.0 pg Final   MCHC 12/15/2023 32.3  30.0 - 36.0 g/dL Final   RDW 94/91/7974 14.6  11.5 - 15.5 % Final   Platelets 12/15/2023 427 (H)  150 - 400 K/uL Final   nRBC 12/15/2023 0.2  0.0 - 0.2 % Final   Neutrophils Relative % 12/15/2023 77  % Final   Neutro Abs 12/15/2023 8.6 (H)  1.7 -  7.7 K/uL Final   Lymphocytes Relative 12/15/2023 17  % Final   Lymphs Abs 12/15/2023 1.9  0.7 - 4.0 K/uL Final   Monocytes Relative 12/15/2023 6  % Final   Monocytes Absolute 12/15/2023 0.6  0.1 - 1.0 K/uL Final   Eosinophils Relative 12/15/2023 0  % Final   Eosinophils Absolute 12/15/2023 0.0  0.0 - 0.5 K/uL Final   Basophils Relative 12/15/2023 0  % Final   Basophils Absolute 12/15/2023 0.0  0.0 - 0.1 K/uL Final   Immature Granulocytes 12/15/2023 0  % Final   Abs Immature Granulocytes 12/15/2023 0.04  0.00 - 0.07 K/uL Final   Performed at [facility], [address], [place], [place] [address]   Acetaminophen  (Tylenol ), Serum 12/15/2023 <10 (L)  10 - 30 ug/mL Final   Comment: (NOTE) Therapeutic concentrations vary significantly. A range of 10-30 ug/mL  may be an effective concentration for many patients. However, some  are best treated at concentrations outside of this range. Acetaminophen  concentrations >150 ug/mL at 4 hours after ingestion  and >50 ug/mL at 12 hours after ingestion are often associated with  toxic reactions.  Performed at [facility], [address], [place], [place] [address]    Salicylate Lvl 12/15/2023 <7.0 (L)  7.0 - 30.0 mg/dL Final   Performed at [facility], [address] [place], [place] [address]   Carbamazepine  Lvl 12/15/2023 <2.0 (L)  4.0 - 12.0 ug/mL Final   Performed at [facility], [address] [place], [place] [address]   WBC 12/16/2023 4.9  4.0 - 10.5 K/uL Final   RBC 12/16/2023 4.47  3.87 - 5.11 MIL/uL Final   Hemoglobin 12/16/2023 12.5  12.0 - 15.0 g/dL Final   HCT 94/90/7974 38.5  36.0 - 46.0 % Final   MCV 12/16/2023 86.1  80.0 - 100.0 fL Final   MCH 12/16/2023 28.0  26.0 - 34.0 pg Final   MCHC 12/16/2023 32.5  30.0 - 36.0 g/dL Final   RDW 94/90/7974 14.7  11.5 - 15.5 % Final   Platelets 12/16/2023 314  150 - 400 K/uL Final   nRBC 12/16/2023 0.0  0.0 - 0.2 % Final   Neutrophils Relative % 12/16/2023 52  % Final   Neutro Abs 12/16/2023 2.5  1.7 - 7.7 K/uL Final   Lymphocytes Relative 12/16/2023 37  % Final   Lymphs Abs 12/16/2023 1.8  0.7 - 4.0 K/uL Final   Monocytes Relative 12/16/2023 8  % Final   Monocytes Absolute 12/16/2023 0.4  0.1 - 1.0 K/uL Final   Eosinophils Relative 12/16/2023 2  % Final   Eosinophils Absolute 12/16/2023 0.1  0.0 - 0.5 K/uL Final   Basophils Relative 12/16/2023 1  % Final   Basophils Absolute 12/16/2023 0.1  0.0 - 0.1 K/uL Final   Immature Granulocytes 12/16/2023 0  % Final   Abs Immature Granulocytes 12/16/2023 0.01  0.00 - 0.07 K/uL Final   Performed at [facility], [address], [place], [place] [address]   Sodium 12/16/2023 142  135 - 145 mmol/L Final   Potassium 12/16/2023 3.7  3.5 - 5.1 mmol/L Final   Chloride 12/16/2023 102  98 - 111 mmol/L Final   CO2 12/16/2023 29  22 - 32 mmol/L Final   Glucose, Bld 12/16/2023 85  70 - 99 mg/dL Final   Glucose reference range applies only to samples taken after fasting for at least 8 hours.   BUN 12/16/2023 15  6 - 20 mg/dL Final   Creatinine, Ser 12/16/2023 0.89  0.44 - 1.00 mg/dL Final   Calcium 94/90/7974 9.3  8.9 - 10.3 mg/dL Final   Total Protein 94/90/7974 6.6  6.5 - 8.1 g/dL Final   Albumin 94/90/7974 3.9  3.5 - 5.0 g/dL Final   AST  94/90/7974 20  15 - 41 U/L Final   ALT 12/16/2023 14  0 - 44 U/L Final   Alkaline Phosphatase 12/16/2023 40  38 - 126 U/L Final   Total Bilirubin 12/16/2023 1.0  0.0 - 1.2 mg/dL Final   GFR, Estimated 12/16/2023 >60  >60 mL/min Final   Comment: (NOTE) Calculated using the CKD-EPI Creatinine Equation (2021)    Anion gap 12/16/2023 11  5 - 15 Final   Performed at [facility], [address] [place], [place] [address]   Magnesium  12/16/2023 2.1  1.7 - 2.4 mg/dL Final   Performed at [facility], [address] [place], [place] [address]   Phosphorus 12/16/2023 3.1  2.5 - 4.6 mg/dL Final   Performed at [facility], [address], [place], [place] [address]  Admission on 10/30/2023, Discharged on 11/07/2023  Component Date Value Ref Range Status   Preg, Serum 10/30/2023 NEGATIVE  NEGATIVE Final   Comment:        THE SENSITIVITY OF THIS METHODOLOGY IS >10 mIU/mL. Performed at [facility], [address], [place], [place] [address]    WBC 10/30/2023 8.6  4.0 - 10.5 K/uL Final   RBC 10/30/2023 4.70  3.87 - 5.11 MIL/uL Final   Hemoglobin 10/30/2023 13.1  12.0 - 15.0 g/dL Final   HCT 96/76/7974 40.7  36.0 - 46.0 % Final   MCV 10/30/2023 86.6  80.0 - 100.0 fL Final   MCH 10/30/2023 27.9  26.0 - 34.0 pg Final   MCHC 10/30/2023 32.2  30.0 - 36.0 g/dL Final   RDW 96/76/7974 14.3  11.5 - 15.5 % Final   Platelets 10/30/2023 223  150 - 400 K/uL Final   nRBC 10/30/2023 0.0  0.0 - 0.2 % Final   Neutrophils Relative % 10/30/2023 82  % Final   Neutro Abs 10/30/2023 7.1  1.7 - 7.7 K/uL Final   Lymphocytes Relative 10/30/2023 10  % Final   Lymphs Abs 10/30/2023 0.8  0.7 - 4.0 K/uL Final   Monocytes Relative 10/30/2023 8  % Final   Monocytes Absolute 10/30/2023 0.7  0.1 - 1.0 K/uL Final   Eosinophils Relative 10/30/2023 0  % Final   Eosinophils Absolute 10/30/2023 0.0  0.0 - 0.5 K/uL Final   Basophils Relative 10/30/2023 0  % Final   Basophils Absolute  10/30/2023 0.0  0.0 - 0.1 K/uL Final   Immature Granulocytes 10/30/2023 0  % Final   Abs Immature Granulocytes 10/30/2023 0.03  0.00 - 0.07 K/uL Final   Performed at [facility], [address] [place], [place] [address]   Sodium 10/30/2023 137  135 - 145 mmol/L Final   Potassium 10/30/2023 3.6  3.5 - 5.1 mmol/L Final   Chloride 10/30/2023 106  98 - 111 mmol/L Final   CO2 10/30/2023 20 (L)  22 - 32 mmol/L Final   Glucose, Bld 10/30/2023 76  70 - 99 mg/dL Final   Glucose reference range applies only to samples taken after fasting for at least 8 hours.   BUN 10/30/2023 15  6 - 20 mg/dL Final   Creatinine, Ser 10/30/2023 0.93  0.44 -  1.00 mg/dL Final   Calcium 96/76/7974 10.0  8.9 - 10.3 mg/dL Final   GFR, Estimated 10/30/2023 >60  >60 mL/min Final   Comment: (NOTE) Calculated using the CKD-EPI Creatinine Equation (2021)    Anion gap 10/30/2023 11  5 - 15 Final   Performed at [facility], [address], [place], [place] [address]  Clinical Support on 08/26/2023  Component Date Value Ref Range Status   Neisseria Gonorrhea 08/26/2023 Negative   Final   Chlamydia 08/26/2023 Negative   Final   Trichomonas 08/26/2023 Negative   Final   Bacterial Vaginitis (gardnerella) 08/26/2023 Positive (A)   Final   Candida Vaginitis 08/26/2023 Negative   Final   Candida Glabrata 08/26/2023 Negative   Final   Comment 08/26/2023 Normal Reference Range Bacterial Vaginosis - Negative   Final   Comment 08/26/2023 Normal Reference Ranger Chlamydia - Negative   Final   Comment 08/26/2023 Normal Reference Range Neisseria Gonorrhea - Negative   Final   Comment 08/26/2023 Normal Reference Range Candida Species - Negative   Final   Comment 08/26/2023 Normal Reference Range Candida Galbrata - Negative   Final   Comment 08/26/2023 Normal Reference Range Trichomonas - Negative   Final    Blood Alcohol level:  Lab Results  Component Value Date   [facility] <15 02/15/2024   ETH  <15 01/26/2024    Metabolic Disorder Labs: Lab Results  Component Value Date   HGBA1C 5.2 02/15/2024   MPG 103 02/15/2024   MPG 91.06 01/26/2024   Lab Results  Component Value Date   PROLACTIN 48.1 (H) 11/10/2021   Lab Results  Component Value Date   CHOL 179 02/15/2024   TRIG 77 02/15/2024   HDL 72 02/15/2024   CHOLHDL 2.5 02/15/2024   VLDL 15 02/15/2024   LDLCALC 92 02/15/2024   LDLCALC 89 01/26/2024    Therapeutic Lab Levels: No results found for: LITHIUM Lab Results  Component Value Date   VALPROATE <10 (L) 12/14/2023   Lab Results  Component Value Date   CBMZ <2.0 (L) 12/15/2023    Physical Findings   AIMS    Flowsheet Row [facility]) [facility] (Discharged) [facility] Ad[facility] (Discharged[facility] Ad[facility] (Discharged) [facility] Admission (Discharged) from OP [facility]  AIMS Total Score 0 0 0 0 0   AUDIT    Flowsheet Row Ad[facility] ([facility]) [facility] Ad[facility] (Discharged) from [facility] Ad[facility] (Discharged) from [facility] Admission (Discharged) from [facility] Ad[facility] (Discharged) from [facility]  Alcohol Use Disorder Identification Test Final Score (AUDIT) 0 9 10 0 0   GAD-7    Flowsheet Row Office Visit from 10/17/2023 in [facility] Office Visit from 09/16/2022 in [facility] Office Visit from 08/18/2022 in  [facility] Routine Prenatal from 05/26/2022 in [facility] Routine Prenatal from 05/19/2022 in [facility]  Total GAD-7 Score  11 17 15 4 6    PHQ2-9    Flowsheet Row Office Visit from 10/17/2023 in [facility] [employer] [facility] Office Visit from 06/27/2023 in [facility] [place] Office Visit from 09/16/2022 in [facility] ED from 08/20/2022 in [facility] Office Visit from 08/18/20[facility]  PHQ-2 Total Score 2 0 6 4 4   PHQ-9 Total Score 10 0 21 10 15    Flowsheet Row ED from 02/15/2024 in [facility] ED from 01/26/2024 in [facility] ED to Hosp-Admission (Discharged) from 12/14/2023 in [facility]  C-SSRS RISK CATEGORY Low Risk Low Risk High Risk     Musculoskeletal  Strength & Muscle Tone: within normal limits Gait & Station: normal Patient leans: N/A  Psychiatric Specialty Exam  Presentation  General Appearance:  Casual  Eye Contact: Fair  Speech: Clear and Coherent  Speech Volume: Normal  Handedness: Right   Mood and Affect  Mood: Labile; Depressed; Irritable  Affect: Labile   Thought Process  Thought Processes: Coherent  Descriptions of Associations:Intact  Orientation:Full (Time, Place and Person)  Thought Content:WDL  Diagnosis of Schizophrenia or Schizoaffective disorder in past: No    Hallucinations:Hallucinations: None  Ideas of Reference:None  Suicidal Thoughts:Suicidal Thoughts: Yes, Active SI Active Intent and/or Plan: Without Intent; Without Plan  Homicidal Thoughts:Homicidal Thoughts: No   Sensorium   Memory: Recent Fair; Immediate Good  Judgment: Poor  Insight: Poor   Executive Functions  Concentration: Fair  Attention Span: Fair  Recall: [employer] of Knowledge: Fair  Language: Fair   Psychomotor Activity  Psychomotor Activity: Psychomotor Activity: Normal   Assets  Assets: Manufacturing systems engineer; Desire for Improvement; Financial Resources/Insurance; Housing; Physical Health; Resilience; Social Support   Sleep  Sleep: Sleep: Fair  No Safety Checks orders active in given range  Nutritional Assessment (For OBS and FBC admissions only) Has the patient had a weight loss or gain of 10 pounds or more in the last 3 months?: No Has the patient had a decrease in food intake/or appetite?: Yes Does the patient have dental problems?: No Does the patient have eating habits or behaviors that may be indicators of an eating disorder including binging or inducing vomiting?: No Has the patient recently lost weight without trying?: 2.0 Has the patient been eating poorly because of a decreased appetite?: 1 Malnutrition Screening Tool Score: 3 Nutritional Assessment Referrals: Medication/Tx changes    Physical Exam  Physical Exam Vitals and nursing note reviewed.  Constitutional:      Appearance: Normal appearance.  HENT:     Head: Normocephalic.     Nose: Nose normal.  Eyes:     Extraocular Movements: Extraocular movements intact.  Cardiovascular:     Rate and Rhythm: Normal rate.  Pulmonary:     Effort: Pulmonary effort is normal.  Musculoskeletal:        General: Normal range of motion.     Cervical back: Normal range of motion.  Neurological:     General: No focal deficit present.     Mental Status: She is alert and oriented to person, place, and time.    Review of Systems  Constitutional: Negative.   HENT: Negative.    Eyes: Negative.   Respiratory: Negative.    Cardiovascular: Negative.   Gastrointestinal: Negative.   Genitourinary:  Negative.    Musculoskeletal: Negative.   Neurological: Negative.   Endo/Heme/Allergies: Negative.   Psychiatric/Behavioral:  Positive for depression, substance abuse and suicidal ideas. The patient is nervous/anxious.    Blood pressure 95/79, pulse (!) 57, temperature 98.1 F (36.7 C), temperature source Oral, resp. rate 17, SpO2 100%, unknown if currently breastfeeding. There is no height or weight on file to calculate BMI.  Treatment Plan Summary: Daily contact with patient to assess and evaluate symptoms and progress in treatment and Medication management Based on today's evaluation, patient is still recommended for inpatient psychiatric hospitalization for safety and mood stabilization.  Patient remains under involuntary commitment for suicidal ideations.  She has been faxed out and will remain in continuous assessment until inpatient bed is found.   - Continue trazodone  100 mg nightly for sleep - Continue agitation protocol, p.o. Ativan  2 mg was added to PRNs for agitation - Vital signs stable - Lab results reviewed: UDS positive for THC and buprenorphine.  All other labs unremarkable.  Disposition: Patient been accepted to [facility] and will be transported via [facility] tomorrow around 10 AM.   Alan JAYSON Mcardle, NP 02/16/2024 9:50 PM

## 2024-02-17 NOTE — ED Notes (Signed)
 Patient resting quietly in bed with eyes closed. Unlabored breathing. NAD noted. Facility protocol safety checks in place. Pt is currently safe on the unit.

## 2024-02-17 NOTE — ED Notes (Signed)
 Patient A&Ox4. Patient irritable during assessment. States I came here voluntarily and all you people do is keep me from leaving by drugging me up. Denies intent/thoughts to harm self/others when asked. Denies A/VH. Patient denies any physical complaints when asked. No distress noted. Support and encouragement provided. Routine safety checks conducted according to facility protocol. Encouraged patient to notify staff if thoughts of harm toward self or others arise. Endorses safety. Patient verbalized understanding and agreement. Plan of care ongoing, no further concerns as of present. Patient expresses no other needs at this time.

## 2024-02-17 NOTE — ED Provider Notes (Signed)
 Sharon Ward is a 28 year old female past psychiatric history of borderline personality disorder, bipolar 1 disorder, depression, PTSD and suicide attempt history.  Patient is currently under involuntary commitment for suicidal ideations and mood instability.  Patient has been accepted to [facility] and will be transferred via sheriff this morning.  Patient became agitated and aggressive yesterday and required IM agitation medications and manual hold for IM med administration.  Per nursing notes no significant events overnight and patient did get some sleep.  Upon assessment today patient continues to be irritable and blaming staff/hospital for her being under IVC and requiring inpatient treatment.  Patient is reminded that she presented with suicidal ideations and thoughts of harming herself which is why she is receiving inpatient hospitalization.  Patient continues to demand staff to call her boyfriend to safety plan and release her IVC.  Staff explained that IVC would not be released at this time and that she will be transferred for inpatient hospitalization by sheriff to [facility] for concerns of harm to self.  Vraylar  was discontinued upon admission, this writer attempted to discuss options for medication management however, patient fixated on discharging and not able to engage in discussion for medication management at this time.

## 2024-03-21 ENCOUNTER — Ambulatory Visit: Payer: Self-pay

## 2024-03-21 NOTE — Telephone Encounter (Signed)
 FYI Only or Action Required?: Action required by provider: request for appointment.  Patient was last seen in primary care on 06/27/2023 by Norleen Lynwood ORN, MD.  Called Nurse Triage reporting Headache.  Symptoms began about a month ago.  Interventions attempted: Nothing.  Symptoms are: gradually worsening.  Triage Disposition: See PCP When Office is Open (Within 3 Days)  Patient/caregiver understands and will follow disposition?: YesCopied from CRM #[id number]. Topic: Clinical - Red Word Triage >> Mar 21, 2024  2:16 PM Roselie BROCKS wrote: [facility] that prompted transfer to Nurse Triage: Pt feels she is about to pass out, heart beating really fast, can't breathe, body aches, feel like going to faint. Reason for Disposition  [1] MILD-MODERATE headache AND [2] present > 3 days (72 hours) AND [3] no improvement after using Care Advice  Answer Assessment - Initial Assessment Questions Headache triggers lots of symptoms like SOB, anxiety, heart palpitations. Pt wants RBC/WBCs rechecked. Pt feels something is wrong with kidneys. Pt was told she had thyroid  issues last year and needs to be on medication but can't afford. Pt hasn't taken medication in a year ago.   Pt also needs office to go over/fill out FL2 form to help with financial issues.     1. LOCATION: Where does it hurt?      Whole head 2. ONSET: When did the headache start? (e.g., minutes, hours, days)      random 3. PATTERN: Does the pain come and go, or has it been constant since it started?     Random  4. SEVERITY: How bad is the pain? and What does it keep you from doing?  (e.g., Scale 1-10; mild, moderate, or severe)     na 5. RECURRENT SYMPTOM: Have you ever had headaches before? If Yes, ask: When was the last time? and What happened that time?      na 6. CAUSE: What do you think is causing the headache?     Not sure 7. MIGRAINE: Have you been diagnosed with migraine headaches? If Yes, ask: Is this  headache similar?      yes 8. HEAD INJURY: Has there been any recent injury to your head?      denies 9. OTHER SYMPTOMS: Do you have any other symptoms? (e.g., fever, stiff neck, eye pain, sore throat, cold symptoms)     Losing weight, hair loss, fatigue, blurry vision  Protocols used: Headache-A-AH

## 2024-03-22 ENCOUNTER — Encounter: Payer: Self-pay | Admitting: Internal Medicine

## 2024-03-22 ENCOUNTER — Other Ambulatory Visit: Payer: Self-pay | Admitting: Internal Medicine

## 2024-03-22 ENCOUNTER — Ambulatory Visit (INDEPENDENT_AMBULATORY_CARE_PROVIDER_SITE_OTHER): Payer: MEDICAID | Admitting: Internal Medicine

## 2024-03-22 ENCOUNTER — Ambulatory Visit (INDEPENDENT_AMBULATORY_CARE_PROVIDER_SITE_OTHER): Payer: MEDICAID

## 2024-03-22 ENCOUNTER — Ambulatory Visit: Payer: Self-pay | Admitting: Internal Medicine

## 2024-03-22 VITALS — BP 112/68 | HR 52 | Ht 60.0 in | Wt 142.0 lb

## 2024-03-22 DIAGNOSIS — D649 Anemia, unspecified: Secondary | ICD-10-CM | POA: Insufficient documentation

## 2024-03-22 DIAGNOSIS — Z0001 Encounter for general adult medical examination with abnormal findings: Secondary | ICD-10-CM | POA: Insufficient documentation

## 2024-03-22 DIAGNOSIS — E559 Vitamin D deficiency, unspecified: Secondary | ICD-10-CM | POA: Insufficient documentation

## 2024-03-22 DIAGNOSIS — N941 Unspecified dyspareunia: Secondary | ICD-10-CM | POA: Diagnosis not present

## 2024-03-22 DIAGNOSIS — G43909 Migraine, unspecified, not intractable, without status migrainosus: Secondary | ICD-10-CM | POA: Insufficient documentation

## 2024-03-22 DIAGNOSIS — G43809 Other migraine, not intractable, without status migrainosus: Secondary | ICD-10-CM

## 2024-03-22 DIAGNOSIS — J45909 Unspecified asthma, uncomplicated: Secondary | ICD-10-CM

## 2024-03-22 DIAGNOSIS — R739 Hyperglycemia, unspecified: Secondary | ICD-10-CM | POA: Diagnosis not present

## 2024-03-22 DIAGNOSIS — E538 Deficiency of other specified B group vitamins: Secondary | ICD-10-CM

## 2024-03-22 DIAGNOSIS — F314 Bipolar disorder, current episode depressed, severe, without psychotic features: Secondary | ICD-10-CM

## 2024-03-22 DIAGNOSIS — E785 Hyperlipidemia, unspecified: Secondary | ICD-10-CM

## 2024-03-22 DIAGNOSIS — R634 Abnormal weight loss: Secondary | ICD-10-CM | POA: Diagnosis not present

## 2024-03-22 LAB — CBC WITH DIFFERENTIAL/PLATELET
Basophils Absolute: 0.1 K/uL (ref 0.0–0.1)
Basophils Relative: 1.5 % (ref 0.0–3.0)
Eosinophils Absolute: 0.1 K/uL (ref 0.0–0.7)
Eosinophils Relative: 2.2 % (ref 0.0–5.0)
HCT: 36.2 % (ref 36.0–46.0)
Hemoglobin: 11.9 g/dL — ABNORMAL LOW (ref 12.0–15.0)
Lymphocytes Relative: 42.1 % (ref 12.0–46.0)
Lymphs Abs: 2.3 K/uL (ref 0.7–4.0)
MCHC: 32.9 g/dL (ref 30.0–36.0)
MCV: 83.7 fl (ref 78.0–100.0)
Monocytes Absolute: 0.6 K/uL (ref 0.1–1.0)
Monocytes Relative: 10.9 % (ref 3.0–12.0)
Neutro Abs: 2.3 K/uL (ref 1.4–7.7)
Neutrophils Relative %: 43.3 % (ref 43.0–77.0)
Platelets: 199 K/uL (ref 150.0–400.0)
RBC: 4.33 Mil/uL (ref 3.87–5.11)
RDW: 17.7 % — ABNORMAL HIGH (ref 11.5–15.5)
WBC: 5.4 K/uL (ref 4.0–10.5)

## 2024-03-22 LAB — URINALYSIS, ROUTINE W REFLEX MICROSCOPIC
Bilirubin Urine: NEGATIVE
Hgb urine dipstick: NEGATIVE
Ketones, ur: NEGATIVE
Nitrite: NEGATIVE
Specific Gravity, Urine: 1.025 (ref 1.000–1.030)
Total Protein, Urine: NEGATIVE
Urine Glucose: NEGATIVE
Urobilinogen, UA: 0.2 (ref 0.0–1.0)
pH: 6 (ref 5.0–8.0)

## 2024-03-22 LAB — IBC PANEL
Iron: 37 ug/dL — ABNORMAL LOW (ref 42–145)
Saturation Ratios: 9 % — ABNORMAL LOW (ref 20.0–50.0)
TIBC: 413 ug/dL (ref 250.0–450.0)
Transferrin: 295 mg/dL (ref 212.0–360.0)

## 2024-03-22 LAB — LIPID PANEL
Cholesterol: 164 mg/dL (ref 0–200)
HDL: 71.3 mg/dL (ref >39.00)
LDL Cholesterol: 79 mg/dL (ref 0–99)
NonHDL: 93.12
Total CHOL/HDL Ratio: 2
Triglycerides: 72 mg/dL (ref 0.0–149.0)
VLDL: 14.4 mg/dL (ref 0.0–40.0)

## 2024-03-22 LAB — HEPATIC FUNCTION PANEL
ALT: 8 U/L (ref 0–35)
AST: 13 U/L (ref 0–37)
Albumin: 4.6 g/dL (ref 3.5–5.2)
Alkaline Phosphatase: 39 U/L (ref 39–117)
Bilirubin, Direct: 0.1 mg/dL (ref 0.0–0.3)
Total Bilirubin: 0.6 mg/dL (ref 0.2–1.2)
Total Protein: 7.3 g/dL (ref 6.0–8.3)

## 2024-03-22 LAB — BASIC METABOLIC PANEL WITH GFR
BUN: 11 mg/dL (ref 6–23)
CO2: 27 meq/L (ref 19–32)
Calcium: 9.2 mg/dL (ref 8.4–10.5)
Chloride: 104 meq/L (ref 96–112)
Creatinine, Ser: 0.81 mg/dL (ref 0.40–1.20)
GFR: 100.13 mL/min (ref >60.00)
Glucose, Bld: 92 mg/dL (ref 70–99)
Potassium: 3.6 meq/L (ref 3.5–5.1)
Sodium: 140 meq/L (ref 135–145)

## 2024-03-22 LAB — VITAMIN B12: Vitamin B-12: 607 pg/mL (ref 211–911)

## 2024-03-22 LAB — VITAMIN D 25 HYDROXY (VIT D DEFICIENCY, FRACTURES): VITD: 30.52 ng/mL (ref 30.00–100.00)

## 2024-03-22 LAB — FERRITIN: Ferritin: 7.7 ng/mL — ABNORMAL LOW (ref 10.0–291.0)

## 2024-03-22 LAB — HEMOGLOBIN A1C: Hgb A1c MFr Bld: 5.5 % (ref 4.6–6.5)

## 2024-03-22 MED ORDER — POLYSACCHARIDE IRON COMPLEX 150 MG PO CAPS: 150.0000 mg | ORAL_CAPSULE | Freq: Every day | ORAL | 1 refills | Status: AC

## 2024-03-22 NOTE — Assessment & Plan Note (Signed)
 For GYN referral, also hx of IUD

## 2024-03-22 NOTE — Progress Notes (Signed)
 Patient ID: Sharon Ward, female   DOB: August 03, 1996, 28 y.o.   MRN: [id number]         Chief Complaint:: wellness exam and Migraine (Pt states that she she first waked up in the morning she is very light headed and and her eyes get really blurry pt was arrested for about 3 weeks and there were giving her depeco 500mg  every day. She has lost a lot of weight, but she eats and she states that she is not feeling well and have no energy. She also is struggling with PTSD. )  , hair loss, wt loss, dyspareunia and hx of IUD, anemia        HPI:  Sharon Ward is a 28 y.o. female here for wellness exam; declines immunizations, o/w up to date                        Also not taking any meds currently. Has hx of IUD, also with recurring dyspareunia, needs GYN follow up.  Has had mild diffuse hair loss to the head, and sgnificant wt loss 27 lbs since jan 2025.  Struggles with ongoing severe bipolar.  No recent overt bleeding other than menses recently.  Pt denies chest pain, increased sob or doe, wheezing, orthopnea, PND, increased LE swelling, palpitations, dizziness or syncope   Pt denies polydipsia, polyuria, or new focal neuro s/s.        Wt Readings from Last 3 Encounters:  03/22/24 142 lb (64.4 kg)  10/17/23 169 lb 11.2 oz (77 kg)  08/26/23 169 lb (76.7 kg)   BP Readings from Last 3 Encounters:  03/22/24 112/68  12/18/23 100/63  11/07/23 105/74   Immunization History  Administered Date(s) Administered   DTaP 12/19/1997, 03/26/1998, 12/10/1998, 12/18/1999   DTaP / Hep B / IPV 09/26/2001   Dtap, Unspecified 09/26/2001   HIB (PRP-OMP) 12/19/1997, 03/26/1998, 12/10/1998   HPV Quadrivalent 11/22/2008, 02/04/2009, 01/19/2012   Hepatitis A, Ped/Adol-2 Dose 10/06/2007, 01/19/2012   Hepatitis B 21-Sep-1997, 12/19/1997, 12/10/1998, 06/12/2009, 07/16/2009   IPV 12/19/1997, 03/26/1998, 12/10/1998   Influenza Nasal 06/09/2009   Influenza Split 09/23/2005, 06/14/2006, 10/06/2007, 05/07/2009    Influenza,inj,Quad PF,6+ Mos 04/25/2017   Influenza-Unspecified 04/25/2017   MMR 12/10/1998, 09/26/2001, 01/16/2020   Meningococcal Conjugate 11/21/2008, 02/01/2018   PPD Test 02/01/2018, 03/28/2018   Tdap 11/22/2008, 10/25/2019, 03/18/2022   Varicella 12/10/1998, 10/06/2007   There are no preventive care reminders to display for this patient.     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 Her smoking use included cigarettes. She started smoking about 17 years ago. She has a 15 pack-year smoking history. She has never used smokeless tobacco. She reports that she does not currently use alcohol. She reports current drug use. Frequency: 4.00 times per week. Drugs: Marijuana and Other-see comments. family history includes Asthma in her brother, father, and mother; Diabetes in her father, mother, paternal grandfather, and paternal grandmother; Healthy in her father and mother; Heart disease  in her paternal grandfather; Hypertension in her father, paternal grandfather, and paternal uncle; Stroke in her paternal grandfather. Allergies  Allergen Reactions   Ascorbate Rash   Ascorbic Acid Hives and Rash   Citrus Rash   Coconut Flavoring Agent (Non-Screening) Rash   Lamotrigine Hives and Rash   Latex Rash and Other (See Comments)    Skin turns red and breaks out where placed   Orange (Diagnostic) Rash   Peach Flavoring Agent (Non-Screening) Rash   Pear Rash   Pineapple Rash   Prunus Persica Hives and Rash   Tape Other (See Comments) and Rash    Skin turns red and breaks out where placed   No current outpatient medications on file prior to visit.   No current facility-administered medications on file prior to visit.        ROS:  All others reviewed and negative.  Objective        PE:  BP 112/68 (BP Location: Left Arm, Patient Position: Sitting, Cuff Size: Normal)   Pulse (!) 52   Ht 5' (1.524 m)   Wt 142 lb (64.4 kg)   LMP 03/02/2024   SpO2 99%   Breastfeeding No   BMI 27.73 kg/m                 Constitutional: Pt appears in NAD               HENT: Head: NCAT.                Right Ear: External ear normal.                 Left Ear: External ear normal.                Eyes: . Pupils are equal, round, and reactive to light. Conjunctivae and EOM are normal               Nose: without d/c or deformity               Neck: Neck supple. Gross normal ROM               Cardiovascular: Normal rate and regular rhythm.                 Pulmonary/Chest: Effort normal and breath sounds without rales or wheezing.                Abd:  Soft, NT, ND, + BS, no organomegaly               Neurological: Pt is alert. At baseline orientation, motor grossly intact               Skin: Skin is warm. No rashes, no other new lesions, LE edema - none               Psychiatric: Pt behavior is normal without agitation , moderate anxiety  Micro: none  Cardiac tracings I have personally  interpreted today:  none  Pertinent Radiological findings (summarize): none   Lab Results  Component Value Date   WBC 5.4 03/22/2024   HGB 11.9 (L) 03/22/2024   HCT 36.2 03/22/2024   PLT 199.0 03/22/2024   GLUCOSE 92 03/22/2024   CHOL 164 03/22/2024   TRIG 72.0 03/22/2024   HDL 71.30 03/22/2024   LDLCALC 79 03/22/2024   ALT 8 03/22/2024   AST 13 03/22/2024   NA 140 03/22/2024   K 3.6 03/22/2024   CL 104 03/22/2024   CREATININE 0.81 03/22/2024   BUN 11 03/22/2024   CO2 27 03/22/2024   TSH 1.797 02/15/2024   HGBA1C 5.5 03/22/2024   Assessment/Plan:  Keneisha Heckart is a 28 y.o. White or Caucasian [1] female with  has a past medical history of Acute respiratory failure with hypercapnia (HCC) (04/05/2023), Asthma, Bipolar 1 disorder, mixed (HCC), Cannabis use disorder (05/05/2021), Depression, GAD (generalized anxiety disorder) (09/18/2019), Generalized anxiety disorder, History of suicide attempts (12/31/2022), Intentional drug overdose (HCC) (11/03/2020), Low-lying placenta (01/07/2022), Major depressive disorder, recurrent severe without psychotic features (HCC) (09/18/2019), Nicotine  use disorder (10/20/2023), Prolonged QT interval (11/02/2020), PTSD (post-traumatic stress disorder) (10/24/2016), Pyelonephritis (06/27/2023), Relationship dysfunction, Suicide attempt (HCC) (11/02/2020), and Toxic metabolic encephalopathy (04/05/2023).  Encounter for well adult exam with abnormal findings Age and sex appropriate education and counseling updated with regular exercise and diet Referrals for preventative services - none needed Immunizations addressed - declines immunizations Smoking counseling  - none needed Evidence for depression or other mood disorder - severe bipolar, non compliant with tx Most recent labs reviewed. I have personally reviewed and have noted: 1) the patient's medical and social history 2) The patient's current medications and supplements 3) The patient's  height, weight, and BMI have been recorded in the chart   Severe bipolar I disorder, current or most recent episode depressed (HCC) Pt declines new psychiatry referral for now  Asthma Stable recently, declines need for inhaler prn  Anemia For  labs including cbc, iron  level  Dyspareunia in female For GYN referral, also hx of IUD  Weight loss ? Psychiatric vs other - for lab but also cxr,  to f/u any worsening symptoms or concerns  Migraine Mild today, declines toradol  IM today, declines other tx for now,  to f/u any worsening symptoms or concerns  Vitamin D  deficiency Last vitamin D  Lab Results  Component Value Date   VD25OH 30.52 03/22/2024   Low, to start oral replacement   Followup: Return in about 1 year (around 03/22/2025).  Lynwood Rush, MD 03/22/2024 8:58 PM [facility]

## 2024-03-22 NOTE — Assessment & Plan Note (Signed)
 Mild today, declines toradol  IM today, declines other tx for now,  to f/u any worsening symptoms or concerns

## 2024-03-22 NOTE — Assessment & Plan Note (Signed)
 For  labs including cbc, iron  level

## 2024-03-22 NOTE — Patient Instructions (Signed)
 Please continue all other medications as before, and refills have been done if requested.  Please have the pharmacy call with any other refills you may need.  Please continue your efforts at being more active, low cholesterol diet, and weight control.  You are otherwise up to date with prevention measures today.  Please keep your appointments with your specialists as you may have planned  You will be contacted regarding the referral for: GYN  Please go to the XRAY Department in the first floor for the x-ray testing  Please go to the LAB at the blood drawing area for the tests to be done  You will be contacted by phone if any changes need to be made immediately.  Otherwise, you will receive a letter about your results with an explanation, but please check with MyChart first.  Please make an Appointment to return for your 1 year visit, or sooner if needed

## 2024-03-22 NOTE — Assessment & Plan Note (Signed)
 Stable recently, declines need for inhaler prn

## 2024-03-22 NOTE — Assessment & Plan Note (Signed)
?   Psychiatric vs other - for lab but also cxr,  to f/u any worsening symptoms or concerns

## 2024-03-22 NOTE — Assessment & Plan Note (Signed)
 Last vitamin D  Lab Results  Component Value Date   VD25OH 30.52 03/22/2024   Low, to start oral replacement

## 2024-03-22 NOTE — Assessment & Plan Note (Signed)
 Pt declines new psychiatry referral for now

## 2024-03-22 NOTE — Assessment & Plan Note (Signed)
 Age and sex appropriate education and counseling updated with regular exercise and diet Referrals for preventative services - none needed Immunizations addressed - declines immunizations Smoking counseling  - none needed Evidence for depression or other mood disorder - severe bipolar, non compliant with tx Most recent labs reviewed. I have personally reviewed and have noted: 1) the patient's medical and social history 2) The patient's current medications and supplements 3) The patient's height, weight, and BMI have been recorded in the chart

## 2024-03-23 LAB — THYROID PANEL WITH TSH
Free Thyroxine Index: 2.3 (ref 1.4–3.8)
T3 Uptake: 30 % (ref 22–35)
T4, Total: 7.8 ug/dL (ref 5.1–11.9)
TSH: 1.45 m[IU]/L

## 2024-03-28 ENCOUNTER — Telehealth: Payer: Self-pay

## 2024-03-28 NOTE — Telephone Encounter (Signed)
 Copied from CRM #[id number]. Topic: General - Other >> Mar 28, 2024  3:14 PM Sharon Ward wrote: Reason for CRM: Patient is calling in regarding the FL2 form, patient is asking for an update on it, and has not heard anything.

## 2024-03-29 ENCOUNTER — Telehealth: Payer: Self-pay

## 2024-03-29 NOTE — Telephone Encounter (Signed)
 Patient called back to check on the status of her paperwork. She said she gave it to Dr. Norleen at her appointment on 03/22/24. She would like a call back at [phone].

## 2024-03-29 NOTE — Telephone Encounter (Signed)
 Copied from CRM #[id number]. Topic: General - Other >> Mar 28, 2024  3:14 PM Chiquita SQUIBB wrote: Reason for CRM: Patient is calling in regarding the [facility] form, patient is asking for an update on it, and has not heard anything. >> Mar 29, 2024  2:43 PM Turkey A wrote: Patient called regarding FL2 document. Agent called CAL and [name] said that she would follow up because CMA for Dr.John has been out this week. Agent apologized to patient and she informed her of this information.Please contact patient with update. >> Mar 29, 2024 12:30 PM Franky GRADE wrote: Patient is calling to follow up on the [facility] form, she called yesterday and was informed she would hear back today but has not gotten a response.

## 2024-03-29 NOTE — Telephone Encounter (Signed)
 Copied from CRM #[id number]. Topic: General - Other >> Mar 28, 2024  3:14 PM Chiquita SQUIBB wrote: Reason for CRM: Patient is calling in regarding the [facility] form, patient is asking for an update on it, and has not heard anything. >> Mar 29, 2024  2:43 PM Turkey A wrote: Patient called regarding FL2 document. Agent called CAL and [name] said that she would follow up because CMA for Dr.John has been out this week. Agent apologized to patient and she informed her of this information.Please contact patient with update. >> Mar 29, 2024 12:30 PM Franky GRADE wrote: Patient is calling to follow up on the [facility] form, she called yesterday and was informed she would hear back today but has not gotten a response.

## 2024-03-29 NOTE — Telephone Encounter (Unsigned)
 Copied from CRM #[id number]. Topic: General - Other >> Mar 28, 2024  3:14 PM Sharon Ward wrote: Reason for CRM: Patient is calling in regarding the [facility] form, patient is asking for an update on it, and has not heard anything. >> Mar 29, 2024  2:43 PM Sharon Ward wrote: Patient called regarding FL2 document. Agent called CAL and [name] said that she would follow up because CMA for Dr.John has been out this week. Agent apologized to patient and she informed her of this information.Please contact patient with update. >> Mar 29, 2024 12:30 PM Sharon Ward wrote: Patient is calling to follow up on the [facility] form, she called yesterday and was informed she would hear back today but has not gotten Ward response.

## 2024-03-30 NOTE — Telephone Encounter (Signed)
 This was completed last wk and given to CMA to fax,   thanks

## 2024-04-02 NOTE — Telephone Encounter (Signed)
 Patient representative has since picked up this form. Copy will be kept for patient file

## 2024-04-02 NOTE — Telephone Encounter (Signed)
 Called and spoke with patient informing them that FL2 form was completed. Asked patient if she would like to pick this up or if there was a fax number we could use. Patient had directed me to their care manager Medford (with [facility]). I attempted to call Medford to get a good fax number and had to leave a voice mail for a call back.  CB for [facility]: [phone]

## 2024-04-05 ENCOUNTER — Other Ambulatory Visit (HOSPITAL_COMMUNITY): Payer: Self-pay

## 2024-04-05 ENCOUNTER — Other Ambulatory Visit: Payer: Self-pay

## 2024-04-05 MED ORDER — VALPROIC ACID 250 MG PO CAPS
250.0000 mg | ORAL_CAPSULE | Freq: Three times a day (TID) | ORAL | 1 refills | Status: AC
  Filled 2024-04-05: qty 45, 15d supply, fill #0

## 2024-04-05 MED ORDER — CARIPRAZINE HCL 3 MG PO CAPS: 3.0000 mg | ORAL_CAPSULE | Freq: Every evening | ORAL | 1 refills | Status: AC

## 2024-04-05 MED ORDER — MIRTAZAPINE 15 MG PO TABS
15.0000 mg | ORAL_TABLET | Freq: Every day | ORAL | 1 refills | Status: AC
  Filled 2024-04-05: qty 15, 15d supply, fill #0

## 2024-04-05 MED ORDER — PRAZOSIN HCL 1 MG PO CAPS
1.0000 mg | ORAL_CAPSULE | Freq: Every evening | ORAL | 1 refills | Status: AC
  Filled 2024-04-05: qty 15, 15d supply, fill #0

## 2024-04-06 ENCOUNTER — Other Ambulatory Visit: Payer: Self-pay

## 2024-04-12 ENCOUNTER — Ambulatory Visit: Payer: MEDICAID

## 2024-04-16 ENCOUNTER — Ambulatory Visit: Payer: Self-pay | Admitting: Certified Nurse Midwife

## 2024-04-16 ENCOUNTER — Encounter (HOSPITAL_COMMUNITY): Payer: Self-pay | Admitting: *Deleted

## 2024-04-16 ENCOUNTER — Inpatient Hospital Stay (HOSPITAL_COMMUNITY)
Admission: AD | Admit: 2024-04-16 | Discharge: 2024-04-16 | Disposition: A | Discharge status: Home / Self Care | Payer: MEDICAID | Attending: Obstetrics and Gynecology | Admitting: Obstetrics and Gynecology

## 2024-04-16 ENCOUNTER — Inpatient Hospital Stay (HOSPITAL_COMMUNITY): Payer: MEDICAID

## 2024-04-16 DIAGNOSIS — F17290 Nicotine dependence, other tobacco product, uncomplicated: Secondary | ICD-10-CM | POA: Insufficient documentation

## 2024-04-16 DIAGNOSIS — N946 Dysmenorrhea, unspecified: Secondary | ICD-10-CM | POA: Insufficient documentation

## 2024-04-16 DIAGNOSIS — F17210 Nicotine dependence, cigarettes, uncomplicated: Secondary | ICD-10-CM | POA: Diagnosis not present

## 2024-04-16 DIAGNOSIS — Z711 Person with feared health complaint in whom no diagnosis is made: Secondary | ICD-10-CM | POA: Diagnosis not present

## 2024-04-16 DIAGNOSIS — N898 Other specified noninflammatory disorders of vagina: Secondary | ICD-10-CM | POA: Diagnosis not present

## 2024-04-16 DIAGNOSIS — F1290 Cannabis use, unspecified, uncomplicated: Secondary | ICD-10-CM | POA: Insufficient documentation

## 2024-04-16 DIAGNOSIS — N941 Unspecified dyspareunia: Secondary | ICD-10-CM | POA: Insufficient documentation

## 2024-04-16 DIAGNOSIS — Z3202 Encounter for pregnancy test, result negative: Secondary | ICD-10-CM | POA: Insufficient documentation

## 2024-04-16 DIAGNOSIS — Z975 Presence of (intrauterine) contraceptive device: Secondary | ICD-10-CM | POA: Insufficient documentation

## 2024-04-16 DIAGNOSIS — Z789 Other specified health status: Secondary | ICD-10-CM

## 2024-04-16 DIAGNOSIS — R109 Unspecified abdominal pain: Secondary | ICD-10-CM | POA: Diagnosis present

## 2024-04-16 HISTORY — DX: Essential (primary) hypertension: I10

## 2024-04-16 HISTORY — DX: Headache, unspecified: R51.9

## 2024-04-16 HISTORY — DX: Unspecified convulsions: R56.9

## 2024-04-16 LAB — URINALYSIS, ROUTINE W REFLEX MICROSCOPIC
Bilirubin Urine: NEGATIVE
Glucose, UA: NEGATIVE mg/dL
Hgb urine dipstick: NEGATIVE
Ketones, ur: NEGATIVE mg/dL
Nitrite: NEGATIVE
Protein, ur: NEGATIVE mg/dL
Specific Gravity, Urine: 1.017 (ref 1.005–1.030)
pH: 5 (ref 5.0–8.0)

## 2024-04-16 LAB — WET PREP, GENITAL
Clue Cells Wet Prep HPF POC: NONE SEEN
Sperm: NONE SEEN
Trich, Wet Prep: NONE SEEN
WBC, Wet Prep HPF POC: >=10 — AB (ref <10)
Yeast Wet Prep HPF POC: NONE SEEN

## 2024-04-16 LAB — POCT PREGNANCY, URINE: Preg Test, Ur: NEGATIVE

## 2024-04-16 NOTE — MAU Note (Signed)
 Sharon Ward is a 29 y.o. at Unknown here in MAU reporting: period was 2 wks late. 2 neg tests last wk. At [facility], her IUD is lost, spent a lot of time inflicting pain,  had a hard time with insertion, feels like they are always rushing  and they hurt her. Couldn't find it months ago, feels it was put in the wrong place.  Has not had an US  to check for placement.  Was told it is perfectly normal and to come back in 3 months.  Sex has been painful. Feels she has an infection because of her blood test results (low WBC/ high red count). Feels it is pelvic related and possibly the cause of her pain. Really bad cramping the past 2 wks, felt like contractions. passed a large clump of mucous yesterday, no blood, was whitish/tan.  LMP: 7/25 Onset of complaint: ongoing Pain score: severe- cramping and feels like the IUD is stabbing her Vitals:   04/16/24 0937  BP: (!) 108/58  Pulse: 66  Resp: 16  Temp: 98.2 F (36.8 C)  SpO2: 100%      Lab orders placed from triage:  UPT, UA  and vag swabs

## 2024-04-16 NOTE — MAU Provider Note (Signed)
 History     CSN: [id number]  Arrival date and time: 04/16/24 9081   Event Date/Time   First Provider Initiated Contact with Patient 04/16/24 1058      Chief Complaint  Patient presents with   Abdominal Pain   Possible Pregnancy   Vaginal Discharge   Sharon Ward , a  28 y.o. [id number] at Unknown presents to MAU with concerns for pregnancy, given late Menstrual period in the presence of a ParaGard  IUD. She reports being 15 days late with 2 negative pregnancy test at home. Patient reports having a IUD placed just over 1 year ago. Since placement she reports pain during intercourse and painful periods. She states that at her last visit, her strings were unable to be visualized. She is concerned that her IUD is lost inside of her somewhere. She also is worried about a persistent vaginal infection related to long term low white blood count and high red blood cell count. Reports that her PCP has recently started her on iron  for this problem. She has questions about her IUD causing this.          Abdominal Pain Pertinent negatives include no constipation, diarrhea, dysuria, fever, headaches, nausea or vomiting.  Possible Pregnancy Associated symptoms include abdominal pain. Pertinent negatives include no chest pain, chills, fatigue, fever, headaches, nausea, vomiting or weakness.  Vaginal Discharge The patient's primary symptoms include pelvic pain and vaginal discharge. Associated symptoms include abdominal pain. Pertinent negatives include no back pain, chills, constipation, diarrhea, dysuria, fever, headaches, nausea or vomiting.    OB History     Gravida  3   Para  2   Term  2   Preterm      AB  1   Living  2      SAB  1   IAB      Ectopic      Multiple  0   Live Births  2        Obstetric Comments  2023 IOL for HTN         Past Medical History:  Diagnosis Date   Acute respiratory failure with hypercapnia (HCC) 04/05/2023   Asthma    Bipolar 1  disorder, mixed (HCC)    Cannabis use disorder 05/05/2021   Depression    GAD (generalized anxiety disorder) 09/18/2019   Generalized anxiety disorder    Headache    History of suicide attempts 12/31/2022   Documented h/o multiple serious suicide attempts  -hanging, OD     Hypertension    Intentional drug overdose (HCC) 11/03/2020   Low-lying placenta 01/07/2022   Resolved 03/04/22   Major depressive disorder, 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 states is heart related   Suicide attempt (HCC) 11/02/2020   Toxic metabolic encephalopathy 04/05/2023    Past Surgical History:  Procedure Laterality Date   PILONIDAL CYST / SINUS EXCISION  09/11/2013   PILONIDAL CYST EXCISION  05/17/2014   Pilonidal cystectomy with cleft lip    Family History  Problem Relation Age of Onset   Asthma Mother    Diabetes Mother    Healthy Mother    Hypertension Father    Asthma Father    Diabetes Father    Healthy Father    Asthma Brother  Hypertension Paternal Uncle    Diabetes Paternal Grandmother    Stroke Paternal Grandfather    Heart disease Paternal Grandfather    Hypertension Paternal Grandfather    Diabetes Paternal Grandfather     Social History   Tobacco Use   Smoking status: Every Day    Current packs/day: 0.25    Average packs/day: 0.9 packs/day for 16.6 years (15.4 ttl pk-yrs)    Types: Cigarettes    Start date: 07/14/2006    Last attempt to quit: 07/14/2021   Smokeless tobacco: Never   Tobacco comments:    only smoke a couple of cigarettes when stressed or anxious, socially with friends per [facility] chart  Vaping Use   Vaping status: Some Days   Start date: 08/10/2003   Last attempt to quit: 05/04/2021  Substance Use Topics   Alcohol use: Not Currently    Drug use: Yes    Types: Marijuana, Other-see comments    Comment: smokes to help her sleep last was Friday, trying to quit    Allergies:  Allergies  Allergen Reactions   Ascorbate Rash   Ascorbic Acid Hives and Rash   Citrus Rash   Coconut Flavoring Agent (Non-Screening) Rash   Lamotrigine Hives and Rash   Latex Rash and Other (See Comments)    Skin turns red and breaks out where placed   Orange (Diagnostic) Rash   Peach Flavoring Agent (Non-Screening) Rash   Pear Rash   Pineapple Rash   Prunus Persica Hives and Rash   Tape Other (See Comments) and Rash    Skin turns red and breaks out where placed    Medications Prior to Admission  Medication Sig Dispense Refill Last Dose/Taking   ibuprofen  (ADVIL ) 200 MG tablet Take 200 mg by mouth every 6 (six) hours as needed.   04/15/2024 Morning   iron  polysaccharides (NU-IRON ) 150 MG capsule Take 1 capsule (150 mg total) by mouth daily. 90 capsule 1 04/16/2024 Morning   mirtazapine  (REMERON ) 15 MG tablet Take 1 tablet (15 mg total) by mouth at bedtime for sleep. 15 tablet 1 04/15/2024 Bedtime   prazosin  (MINIPRESS ) 1 MG capsule Take 1 capsule (1 mg total) by mouth at bedtime for nightmares 15 capsule 1 04/15/2024 Bedtime   valproic  acid (DEPAKENE ) 250 MG capsule Take 1 capsule (250 mg total) by mouth 3 (three) times daily for bipolar disorder. 45 capsule 1 04/15/2024 Bedtime   cariprazine  (VRAYLAR ) 3 MG capsule Take 1 capsule (3 mg total) by mouth at bedtime for bipolar disord, crnt episode manic severe with psych freatures. 15 capsule 1     Review of Systems  Constitutional:  Negative for chills, fatigue and fever.  Eyes:  Negative for pain and visual disturbance.  Respiratory:  Negative for apnea, shortness of breath and wheezing.   Cardiovascular:  Negative for chest pain and palpitations.  Gastrointestinal:  Positive for abdominal pain. Negative for constipation, diarrhea, nausea and vomiting.  Genitourinary:  Positive for pelvic pain and  vaginal discharge. Negative for difficulty urinating, dysuria, vaginal bleeding and vaginal pain.  Musculoskeletal:  Negative for back pain.  Neurological:  Negative for seizures, weakness and headaches.  Psychiatric/Behavioral:  Negative for suicidal ideas.    Physical Exam   Blood pressure (!) 108/58, pulse 66, temperature 98.2 F (36.8 C), temperature source Oral, resp. rate 16, height 5' 3 (1.6 m), weight 63.8 kg, last menstrual period 03/02/2024, SpO2 100%, not currently breastfeeding.  Physical Exam Vitals and nursing note reviewed.  Constitutional:  General: She is not in acute distress.    Appearance: Normal appearance.  HENT:     Head: Normocephalic.  Pulmonary:     Effort: Pulmonary effort is normal.  Abdominal:     Tenderness: There is no abdominal tenderness.  Musculoskeletal:     Cervical back: Normal range of motion.  Skin:    General: Skin is warm and dry.  Neurological:     Mental Status: She is alert and oriented to person, place, and time.  Psychiatric:        Mood and Affect: Mood normal.     MAU Course  Procedures Orders Placed This Encounter  Procedures   Wet prep, genital   US  PELVIC COMPLETE WITH TRANSVAGINAL   Urinalysis, Routine w reflex microscopic -Urine, Clean Catch   hCG, quantitative, pregnancy   Pregnancy, urine POC    MDM - UPT negative in MAU  - US  ordered for confirmation of IUD placement,  - Plan to notify patient via Telephone of results as patient has to pick up her children.  - CNM independently reviewed US  imagining, and noted a IUD inplace at the top of the uterus. Strings also visualized within the cervix.   - plan for discharge   Assessment and Plan   1. Physically well but worried   2. Not currently pregnant   3. IUD (intrauterine device) in place   4. Dysmenorrhea    - I called Lum Slater Ply today at 12:36 PM and confirmed patient's identity using two patient identifiers. US  results from earlier today were  reviewed. Patient is NOT Pregnant,. Patient voiced understanding and had no further questions.  - Discussed correct IUD placement and pain and bleeding expectations with ParaGard  IUD,  - Addressed patients concerns for feeling dismissed by [facility]. List of providers given in the area via MyChart.  - Reviewed worsening signs and return precautions.  - Patient discharged home in stable condition and may return to MAU as needed .  Claris CHRISTELLA Cedar, MSN CNM  04/16/2024, 10:58 AM

## 2024-04-17 LAB — GC/CHLAMYDIA PROBE AMP (~~LOC~~) NOT AT ARMC
Chlamydia: NEGATIVE
Comment: NEGATIVE
Comment: NORMAL
Neisseria Gonorrhea: NEGATIVE

## 2024-06-25 ENCOUNTER — Ambulatory Visit (HOSPITAL_COMMUNITY)
Admission: EM | Admit: 2024-06-25 | Discharge: 2024-06-25 | Disposition: A | Discharge status: Home / Self Care | Payer: MEDICAID

## 2024-06-25 DIAGNOSIS — F603 Borderline personality disorder: Secondary | ICD-10-CM | POA: Insufficient documentation

## 2024-06-25 DIAGNOSIS — Z79899 Other long term (current) drug therapy: Secondary | ICD-10-CM | POA: Insufficient documentation

## 2024-06-25 DIAGNOSIS — F314 Bipolar disorder, current episode depressed, severe, without psychotic features: Secondary | ICD-10-CM | POA: Insufficient documentation

## 2024-06-25 DIAGNOSIS — R45851 Suicidal ideations: Secondary | ICD-10-CM

## 2024-06-25 NOTE — Discharge Instructions (Addendum)
 Get help right away if: You have thoughts about hurting yourself or others. Get help right away if you feel like you may hurt yourself or others, or have thoughts about taking your own life. Go to your nearest emergency room or: Call 911. Call the National Suicide Prevention Lifeline at 959-485-0451 or 988 in the [place] This is open 24 hours a day. If you're a Veteran: Call 988 and press 1. This is open 24 hours a day. Text the [employer] at 234-519-0667. Summary Mental health is not just the absence of mental illness. It involves understanding your emotions and behaviors, and taking steps to manage them in a healthy way. If you have symptoms of mental or emotional distress, get help from family, friends, a health care provider, or a mental health professional. Practice good mental health behaviors such as stress management skills, self-calming skills, exercise, healthy sleeping and eating, and supportive relationships. This information is not intended to replace advice given to you by your health care provider. Make sure you discuss any questions you have with your health care provider.  Education provided on the fact that if experiencing worsening of psychiatry symptoms including suicidal ideations, homicidal ideations, or having auditory/visual hallucinations, etc, to call 911, 988, come back to this location, or go to the nearest ER. Pt verbalized understanding.   Please follow up with your psychiatrist and mental health providers. You reported an appointment on 07/03/24 at [facility] for medication management and a therapy appointment on Fridays. Please ensure you attend both appointments.
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
## 2024-06-27 ENCOUNTER — Emergency Department (HOSPITAL_COMMUNITY)
Admission: EM | Admit: 2024-06-27 | Discharge: 2024-06-28 | Disposition: A | Discharge status: Other Acute Inpatient Hospital | Payer: MEDICAID | Attending: Emergency Medicine | Admitting: Emergency Medicine

## 2024-06-27 ENCOUNTER — Other Ambulatory Visit: Payer: Self-pay

## 2024-06-27 DIAGNOSIS — F1290 Cannabis use, unspecified, uncomplicated: Secondary | ICD-10-CM | POA: Diagnosis present

## 2024-06-27 DIAGNOSIS — F1920 Other psychoactive substance dependence, uncomplicated: Secondary | ICD-10-CM

## 2024-06-27 DIAGNOSIS — Y905 Blood alcohol level of 100-119 mg/100 ml: Secondary | ICD-10-CM | POA: Insufficient documentation

## 2024-06-27 DIAGNOSIS — R45850 Homicidal ideations: Secondary | ICD-10-CM | POA: Insufficient documentation

## 2024-06-27 DIAGNOSIS — R630 Anorexia: Secondary | ICD-10-CM | POA: Diagnosis not present

## 2024-06-27 DIAGNOSIS — Z79899 Other long term (current) drug therapy: Secondary | ICD-10-CM | POA: Diagnosis not present

## 2024-06-27 DIAGNOSIS — F1090 Alcohol use, unspecified, uncomplicated: Secondary | ICD-10-CM | POA: Diagnosis not present

## 2024-06-27 DIAGNOSIS — F1210 Cannabis abuse, uncomplicated: Secondary | ICD-10-CM | POA: Diagnosis not present

## 2024-06-27 DIAGNOSIS — F10920 Alcohol use, unspecified with intoxication, uncomplicated: Secondary | ICD-10-CM

## 2024-06-27 DIAGNOSIS — F32A Depression, unspecified: Secondary | ICD-10-CM

## 2024-06-27 DIAGNOSIS — F6089 Other specific personality disorders: Secondary | ICD-10-CM

## 2024-06-27 DIAGNOSIS — Z91040 Latex allergy status: Secondary | ICD-10-CM | POA: Diagnosis not present

## 2024-06-27 DIAGNOSIS — F329 Major depressive disorder, single episode, unspecified: Secondary | ICD-10-CM | POA: Diagnosis not present

## 2024-06-27 DIAGNOSIS — R45851 Suicidal ideations: Secondary | ICD-10-CM | POA: Diagnosis not present

## 2024-06-27 DIAGNOSIS — F29 Unspecified psychosis not due to a substance or known physiological condition: Secondary | ICD-10-CM | POA: Diagnosis not present

## 2024-06-27 LAB — HCG, SERUM, QUALITATIVE: Preg, Serum: NEGATIVE

## 2024-06-27 LAB — ETHANOL: Alcohol, Ethyl (B): 112 mg/dL — ABNORMAL HIGH (ref <15)

## 2024-06-27 LAB — URINE DRUG SCREEN
Amphetamines: NEGATIVE
Barbiturates: NEGATIVE
Benzodiazepines: NEGATIVE
Cocaine: NEGATIVE
Fentanyl: NEGATIVE
Methadone Scn, Ur: NEGATIVE
Opiates: NEGATIVE
Tetrahydrocannabinol: POSITIVE — AB

## 2024-06-27 LAB — CBC
HCT: 36.6 % (ref 36.0–46.0)
Hemoglobin: 12 g/dL (ref 12.0–15.0)
MCH: 28.8 pg (ref 26.0–34.0)
MCHC: 32.8 g/dL (ref 30.0–36.0)
MCV: 88 fL (ref 80.0–100.0)
Platelets: 256 K/uL (ref 150–400)
RBC: 4.16 MIL/uL (ref 3.87–5.11)
RDW: 14.1 % (ref 11.5–15.5)
WBC: 4.6 K/uL (ref 4.0–10.5)
nRBC: 0 % (ref 0.0–0.2)

## 2024-06-27 LAB — COMPREHENSIVE METABOLIC PANEL WITH GFR
ALT: 13 U/L (ref 0–44)
AST: 21 U/L (ref 15–41)
Albumin: 4.6 g/dL (ref 3.5–5.0)
Alkaline Phosphatase: 55 U/L (ref 38–126)
Anion gap: 13 (ref 5–15)
BUN: 10 mg/dL (ref 6–20)
CO2: 20 mmol/L — ABNORMAL LOW (ref 22–32)
Calcium: 9.4 mg/dL (ref 8.9–10.3)
Chloride: 107 mmol/L (ref 98–111)
Creatinine, Ser: 0.76 mg/dL (ref 0.44–1.00)
GFR, Estimated: >60 mL/min (ref >60)
Glucose, Bld: 79 mg/dL (ref 70–99)
Potassium: 3.3 mmol/L — ABNORMAL LOW (ref 3.5–5.1)
Sodium: 141 mmol/L (ref 135–145)
Total Bilirubin: 0.4 mg/dL (ref 0.0–1.2)
Total Protein: 7.4 g/dL (ref 6.5–8.1)

## 2024-06-27 MED ORDER — POTASSIUM CHLORIDE CRYS ER 20 MEQ PO TBCR
20.0000 meq | EXTENDED_RELEASE_TABLET | Freq: Once | ORAL | Status: AC
  Administered 2024-06-27: 20 meq via ORAL
  Filled 2024-06-27: qty 1

## 2024-06-27 MED ORDER — MIRTAZAPINE 7.5 MG PO TABS
15.0000 mg | ORAL_TABLET | Freq: Every day | ORAL | Status: DC
  Administered 2024-06-27: 15 mg via ORAL
  Filled 2024-06-27: qty 2

## 2024-06-27 MED ORDER — POLYSACCHARIDE IRON COMPLEX 150 MG PO CAPS
150.0000 mg | ORAL_CAPSULE | Freq: Every day | ORAL | Status: DC
  Administered 2024-06-27: 150 mg via ORAL
  Filled 2024-06-27 (×2): qty 1

## 2024-06-27 MED ORDER — ONDANSETRON 4 MG PO TBDP: 4.0000 mg | ORAL_TABLET | Freq: Three times a day (TID) | ORAL | Status: DC | PRN

## 2024-06-27 NOTE — ED Notes (Signed)
 Pt brought back to room 29 and pt's belongings were stored in a black book bag and placed in locker 29
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
## 2024-06-27 NOTE — ED Notes (Signed)
 Writer left voicemail with sheriff's dept to arrange transportation.

## 2024-06-27 NOTE — BH Assessment (Signed)
 Clinician spoke to [facility]  with [facility] to complete pt's TTS assessment. Clinician provided pt's name, MRN, location, age, room number. Secure message completed.  Patient is scheduled to be seen at 1415 by [facility] provider.

## 2024-06-27 NOTE — Progress Notes (Signed)
 Pt has been accepted to [facility] on 06/27/2024 Bed assignment: [address]   Pt meets inpatient criteria per: Bernardino Erm  MD   Attending Physician will be: Mitchael Jumper MD   Report can be called to: [phone]  Pt can arrive after: ASAP   Care Team Notified: Chesley Holt [name], Odetta Bunde RN, Stevens Pereyra paramedic   [place] Dacia Capers LCSW-A   06/27/2024 4:05 PM
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
## 2024-06-27 NOTE — ED Triage Notes (Signed)
 Pt has c/o SI- has also been self harming. Pt reports putting a rope around her neck yesterday, and beating herself up, punching holes in walls. Pt was recently seen at [facility], but left due to not liking the treatment there.

## 2024-06-27 NOTE — ED Notes (Signed)
 Writer recollected red tube. Writer wasted 10cc before recollecting red tube.

## 2024-06-27 NOTE — Progress Notes (Signed)
 LCSW Progress Note  [id number]   Sharon Ward  06/27/2024  3:41 PM  Description:   Inpatient Psychiatric Referral  Patient was recommended inpatient per  Bernardino Erm (NP). There are no available beds at [facility], per [facility] Carlo RN). Patient was referred to the following out of network facilities:   [facility] Provider Address Phone Fax  [facility]  [address], [place] [address] [phone]  [facility]  [address] [place] [address] [phone]  [facility]  [address] [place], [place] [address] [phone]  [facility]  [address] [place] [address] [phone]  [facility]  [address] [place] [address] [phone]  [facility]  [address], [place] [address] [phone]  [facility]  [address] [name] [address] [phone]  [facility] EFAX  [address] [place], [place] [phone]  [facility]  [address] [place] [address] [phone]  [facility]  [address] [place] [address] [phone]  [facility]  [address] [place] [address] [phone]      Situation ongoing, CSW to continue following and update chart as more information becomes available.     [place] Rosene Pilling, MSW, LCSW  06/27/2024 3:41 PM

## 2024-06-27 NOTE — ED Notes (Signed)
 Snack given.

## 2024-06-27 NOTE — ED Provider Notes (Signed)
 [place] EMERGENCY DEPARTMENT AT [facility] Provider Note   CSN: [id number] Arrival date & time: 06/27/24  1206     Patient presents with: Suicidal   Sharon Ward is a 28 y.o. female.  Patient is a 28 year old female with a history of borderline personality disorder, previous suicide attempts, cannabis abuse, PTSD, and bipolar 1 disorder who presents to the ED requesting psychiatric evaluation.  Patient notes over the past 2 weeks, she has been spiraling with her mental health.  She notes this all began during a court hearing where she lost of her children and they were placed for adoption.  She states her alcohol and marijuana use have increased over the past 2 weeks and they are no longer  helping her numb the pain.  She states she has not been on consistent psychiatric medications since June 2024.  She states she did try to go to outpatient mental health on Monday but states no one there would help her.  She states over the past 2 days, she has had 2 episodes where she has tried to strangle herself with a rope.  She has had consistent suicidal ideations.  She also notes intermittent homicidal ideations when others make her angry.  She notes cutting her legs and trying to self-harm herself.  She states she would like an inpatient psychiatric stay to help with her symptoms as she states she can no longer live like this.  She also notes consistent nausea as she states she has issues with an eating disorder.  Notes she has decreased appetite and just does not want to eat.  States she has not eaten in 2 days.  Denies any current pain.  No other complaints.   HPI     Prior to Admission medications   Medication Sig Start Date End Date Taking? Authorizing Provider  cariprazine  (VRAYLAR ) 3 MG capsule Take 1 capsule (3 mg total) by mouth at bedtime for bipolar disord, crnt episode manic severe with psych freatures. 02/03/24     ibuprofen  (ADVIL ) 200 MG tablet Take 200 mg by mouth  every 6 (six) hours as needed.    [provider]  iron  polysaccharides (NU-IRON ) 150 MG capsule Take 1 capsule (150 mg total) by mouth daily. 03/22/24   Norleen Lynwood ORN, MD  mirtazapine  (REMERON ) 15 MG tablet Take 1 tablet (15 mg total) by mouth at bedtime for sleep. 02/03/24     prazosin  (MINIPRESS ) 1 MG capsule Take 1 capsule (1 mg total) by mouth at bedtime for nightmares 02/03/24   Diop, Maimouna, MD  valproic  acid (DEPAKENE ) 250 MG capsule Take 1 capsule (250 mg total) by mouth 3 (three) times daily for bipolar disorder. 02/03/24       Allergies: Ascorbate, Ascorbic acid, Citrus, Coconut flavoring agent (non-screening), Lamotrigine, Latex, Orange (diagnostic), Peach flavoring agent (non-screening), Pear, Pineapple, Prunus persica, and Tape    Review of Systems  Constitutional:  Negative for fever.  Psychiatric/Behavioral:  Positive for behavioral problems, self-injury and suicidal ideas. Negative for agitation, confusion and hallucinations.   All other systems reviewed and are negative.   Updated Vital Signs BP 136/89   Pulse 92   Temp 98 F (36.7 C)   Resp 19   SpO2 98%   Physical Exam Constitutional:      Appearance: Normal appearance.  HENT:     Head: Normocephalic and atraumatic.     Mouth/Throat:     Mouth: Mucous membranes are moist.     Pharynx: Oropharynx is  clear.  Cardiovascular:     Rate and Rhythm: Normal rate.  Pulmonary:     Effort: Pulmonary effort is normal.  Abdominal:     General: Bowel sounds are normal.     Palpations: Abdomen is soft.     Tenderness: There is no abdominal tenderness.  Musculoskeletal:        General: Normal range of motion.  Skin:    General: Skin is warm and dry.  Neurological:     Mental Status: She is alert and oriented to person, place, and time.  Psychiatric:        Mood and Affect: Mood normal.        Behavior: Behavior normal.     (all labs ordered are listed, but only abnormal results are displayed) Labs  Reviewed  COMPREHENSIVE METABOLIC PANEL WITH GFR - Abnormal; Notable for the following components:      Result Value   Potassium 3.3 (*)    CO2 20 (*)    All other components within normal limits  ETHANOL - Abnormal; Notable for the following components:   Alcohol, Ethyl (B) 112 (*)    All other components within normal limits  URINE DRUG SCREEN - Abnormal; Notable for the following components:   Tetrahydrocannabinol POSITIVE (*)    All other components within normal limits  CBC  HCG, SERUM, QUALITATIVE    EKG: None  Radiology: No results found.    Medications Ordered in the ED  ondansetron  (ZOFRAN -ODT) disintegrating tablet 4 mg (has no administration in time range)  mirtazapine  (REMERON ) tablet 15 mg (has no administration in time range)  iron  polysaccharides (NIFEREX) capsule 150 mg (has no administration in time range)  potassium chloride  SA (KLOR-CON  M) CR tablet 20 mEq (20 mEq Oral Given 06/27/24 1353)                                   Medical Decision Making Amount and/or Complexity of Data Reviewed Labs: ordered.  Risk Prescription drug management.    Patient is a 28 year old female with a history of bipolar disorder and PTSD who presents to the ED requesting psychiatric evaluation.  Patient also notes to suicidal attempt over the past 2 days by attempting to strangle herself.  Notes continued suicidal and homicidal ideations.  Please see detailed HPI above.  On exam patient is alert and lying in the bed.  Physical exam is notable.  She is upset but not currently agitated.  Mild hypokalemia of 3.3 on labs today, oral potassium given.  Alcohol elevated at 112.  hCG negative.  CBC unremarkable.  Urine drug screen pending positive for cannabinoids.  Patient did note nausea, as needed Zofran  ordered.  Otherwise no acute complaints or pain noted.  Due to patient's continued suicidal ideations, patient has been placed on IVC papers.  Remeron  as well as iron  supplements  have been ordered.  Further medications have been withheld as patient has not been on any psychiatric medications for over a year.  Patient has been cleared medically and awaiting psychiatric evaluation.   Final diagnoses:  None    ED Discharge Orders     None          Neysa Thersia RAMAN, [place] 06/27/24 1412    Dreama Longs, MD 06/27/24 2241

## 2024-06-28 NOTE — ED Notes (Signed)
 Patient is resting comfortably.

## 2024-06-28 NOTE — ED Notes (Signed)
 Patient off unit to facility per provider. Patient alert, cooperative, no s/s of distress at time of discharge. Discharge information and belongings given to [facility] for transport. Patient ambulatory off unit, escorted and transported by [email or web address]

## 2024-06-28 NOTE — ED Notes (Signed)
 Patient is resting comfortably pt said check her vitals when she gets up

## 2024-06-28 NOTE — ED Provider Notes (Addendum)
 Emergency Medicine Observation Re-evaluation Note  Chenel Wernli is a 28 y.o. female, seen on rounds today.  Pt initially presented to the ED for complaints of Suicidal Currently, the patient is resting comfortably.  Physical Exam  BP 119/64 (BP Location: Right Arm)   Pulse 65   Temp 97.7 F (36.5 C) (Oral)   Resp 18   SpO2 99%  Physical Exam General: nad Cardiac: good peripheral perfusion Lungs: bilateral chest rise Psych: resting comfortably   ED Course / MDM  EKG:   I have reviewed the labs performed to date as well as medications administered while in observation.  Recent changes in the last 24 hours include patient was seen and medically cleared and seen by psychiatry and recommended for inpatient placement.  Patient has been assigned to [facility].  Thought to be leaving this morning..  Plan  Current plan is for psychiatric hospitalization. Patient remains stable for transfer.   11:12 AM Transport now available.  Patient remains stable.    Emil Share, DO 06/28/24 1112

## 2024-08-28 ENCOUNTER — Ambulatory Visit: Payer: MEDICAID | Admitting: Internal Medicine
